# Patient Record
Sex: Male | Born: 1945
Health system: Southern US, Community
[De-identification: ages and names within clinical notes are randomized; demographics above are authoritative.]

## PROBLEM LIST (undated history)

## (undated) DIAGNOSIS — G4700 Insomnia, unspecified: Secondary | ICD-10-CM

## (undated) DIAGNOSIS — N209 Urinary calculus, unspecified: Secondary | ICD-10-CM

## (undated) DIAGNOSIS — Z87442 Personal history of urinary calculi: Secondary | ICD-10-CM

## (undated) DIAGNOSIS — F329 Major depressive disorder, single episode, unspecified: Secondary | ICD-10-CM

## (undated) DIAGNOSIS — F32A Depression, unspecified: Secondary | ICD-10-CM

## (undated) DIAGNOSIS — H269 Unspecified cataract: Secondary | ICD-10-CM

## (undated) DIAGNOSIS — N400 Enlarged prostate without lower urinary tract symptoms: Secondary | ICD-10-CM

## (undated) DIAGNOSIS — F419 Anxiety disorder, unspecified: Secondary | ICD-10-CM

## (undated) DIAGNOSIS — E785 Hyperlipidemia, unspecified: Secondary | ICD-10-CM

## (undated) DIAGNOSIS — I10 Essential (primary) hypertension: Secondary | ICD-10-CM

## (undated) DIAGNOSIS — K635 Polyp of colon: Secondary | ICD-10-CM

## (undated) DIAGNOSIS — B1920 Unspecified viral hepatitis C without hepatic coma: Secondary | ICD-10-CM

## (undated) DIAGNOSIS — K219 Gastro-esophageal reflux disease without esophagitis: Secondary | ICD-10-CM

## (undated) DIAGNOSIS — F909 Attention-deficit hyperactivity disorder, unspecified type: Secondary | ICD-10-CM

## (undated) DIAGNOSIS — J45909 Unspecified asthma, uncomplicated: Secondary | ICD-10-CM

## (undated) DIAGNOSIS — S0291XA Unspecified fracture of skull, initial encounter for closed fracture: Secondary | ICD-10-CM

## (undated) DIAGNOSIS — J439 Emphysema, unspecified: Secondary | ICD-10-CM

## (undated) HISTORY — PX: APPENDECTOMY: SHX54

## (undated) HISTORY — DX: Emphysema, unspecified: J43.9

## (undated) HISTORY — DX: Gastro-esophageal reflux disease without esophagitis: K21.9

## (undated) HISTORY — PX: COLONOSCOPY W/ POLYPECTOMY: SHX1380

## (undated) HISTORY — DX: Unspecified cataract: H26.9

## (undated) HISTORY — DX: Polyp of colon: K63.5

## (undated) HISTORY — DX: Urinary calculus, unspecified: N20.9

## (undated) HISTORY — PX: MOHS SURGERY: SUR867

## (undated) HISTORY — DX: Unspecified fracture of skull, initial encounter for closed fracture: S02.91XA

## (undated) HISTORY — PX: INGUINAL HERNIA REPAIR: SHX194

## (undated) HISTORY — DX: Attention-deficit hyperactivity disorder, unspecified type: F90.9

## (undated) HISTORY — DX: Hyperlipidemia, unspecified: E78.5

## (undated) HISTORY — DX: Unspecified viral hepatitis C without hepatic coma: B19.20

## (undated) HISTORY — DX: Major depressive disorder, single episode, unspecified: F32.9

## (undated) HISTORY — DX: Insomnia, unspecified: G47.00

## (undated) HISTORY — DX: Benign prostatic hyperplasia without lower urinary tract symptoms: N40.0

## (undated) HISTORY — PX: CERVICAL DISC SURGERY: SHX588

## (undated) HISTORY — PX: COLONOSCOPY: SHX174

## (undated) HISTORY — PX: POLYPECTOMY: SHX149

## (undated) HISTORY — PX: TONSILLECTOMY: SUR1361

## (undated) HISTORY — PX: VASECTOMY: SHX75

## (undated) HISTORY — DX: Depression, unspecified: F32.A

## (undated) HISTORY — DX: Anxiety disorder, unspecified: F41.9

## (undated) HISTORY — PX: CERVICAL FUSION: SHX112

## (undated) HISTORY — PX: UMBILICAL HERNIA REPAIR: SHX196

---
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
## 2000-06-28 ENCOUNTER — Encounter: Payer: Self-pay | Admitting: Internal Medicine

## 2000-06-28 ENCOUNTER — Encounter: Admission: RE | Admit: 2000-06-28 | Discharge: 2000-06-28 | Payer: Self-pay | Admitting: Internal Medicine

## 2001-06-17 ENCOUNTER — Encounter: Payer: Self-pay | Admitting: Neurosurgery

## 2001-06-20 ENCOUNTER — Encounter: Payer: Self-pay | Admitting: Neurosurgery

## 2001-06-20 ENCOUNTER — Inpatient Hospital Stay (HOSPITAL_COMMUNITY): Admission: RE | Admit: 2001-06-20 | Discharge: 2001-06-21 | Payer: Self-pay | Admitting: Neurosurgery

## 2001-07-30 ENCOUNTER — Encounter: Admission: RE | Admit: 2001-07-30 | Discharge: 2001-07-30 | Payer: Self-pay | Admitting: Neurosurgery

## 2001-07-30 ENCOUNTER — Encounter: Payer: Self-pay | Admitting: Neurosurgery

## 2001-10-29 ENCOUNTER — Encounter: Admission: RE | Admit: 2001-10-29 | Discharge: 2001-10-29 | Payer: Self-pay | Admitting: Neurosurgery

## 2001-10-29 ENCOUNTER — Encounter: Payer: Self-pay | Admitting: Neurosurgery

## 2002-02-19 ENCOUNTER — Encounter: Payer: Self-pay | Admitting: Neurosurgery

## 2002-02-19 ENCOUNTER — Encounter: Admission: RE | Admit: 2002-02-19 | Discharge: 2002-02-19 | Payer: Self-pay | Admitting: Neurosurgery

## 2002-07-16 ENCOUNTER — Encounter: Admission: RE | Admit: 2002-07-16 | Discharge: 2002-07-16 | Payer: Self-pay | Admitting: Neurosurgery

## 2002-07-16 ENCOUNTER — Encounter: Payer: Self-pay | Admitting: Neurosurgery

## 2003-01-29 ENCOUNTER — Encounter (INDEPENDENT_AMBULATORY_CARE_PROVIDER_SITE_OTHER): Payer: Self-pay | Admitting: Specialist

## 2003-01-29 ENCOUNTER — Ambulatory Visit (HOSPITAL_COMMUNITY): Admission: RE | Admit: 2003-01-29 | Discharge: 2003-01-29 | Payer: Self-pay | Admitting: General Surgery

## 2003-06-15 ENCOUNTER — Encounter: Admission: RE | Admit: 2003-06-15 | Discharge: 2003-06-15 | Payer: Self-pay | Admitting: Neurosurgery

## 2004-01-19 ENCOUNTER — Ambulatory Visit: Payer: Self-pay | Admitting: Internal Medicine

## 2004-02-28 ENCOUNTER — Ambulatory Visit: Payer: Self-pay | Admitting: Internal Medicine

## 2004-10-18 ENCOUNTER — Ambulatory Visit: Payer: Self-pay | Admitting: Internal Medicine

## 2005-01-03 ENCOUNTER — Ambulatory Visit: Payer: Self-pay | Admitting: Internal Medicine

## 2005-08-31 ENCOUNTER — Ambulatory Visit: Payer: Self-pay | Admitting: Internal Medicine

## 2005-10-29 ENCOUNTER — Ambulatory Visit: Payer: Self-pay | Admitting: Internal Medicine

## 2006-02-04 ENCOUNTER — Ambulatory Visit: Payer: Self-pay | Admitting: Internal Medicine

## 2006-02-04 LAB — CONVERTED CEMR LAB
Chol/HDL Ratio, serum: 3.8
Cholesterol: 156 mg/dL (ref 0–200)
HDL: 41.5 mg/dL (ref >39.0)
LDL Cholesterol: 91 mg/dL (ref 0–99)
PSA: 1.01 ng/mL (ref 0.10–4.00)
TSH: 4.22 microintl units/mL (ref 0.35–5.50)
Triglyceride fasting, serum: 118 mg/dL (ref 0–149)
VLDL: 24 mg/dL (ref 0–40)

## 2006-07-31 DIAGNOSIS — Z9089 Acquired absence of other organs: Secondary | ICD-10-CM | POA: Insufficient documentation

## 2006-07-31 DIAGNOSIS — F988 Other specified behavioral and emotional disorders with onset usually occurring in childhood and adolescence: Secondary | ICD-10-CM | POA: Insufficient documentation

## 2006-07-31 DIAGNOSIS — E785 Hyperlipidemia, unspecified: Secondary | ICD-10-CM | POA: Insufficient documentation

## 2006-07-31 DIAGNOSIS — Z9889 Other specified postprocedural states: Secondary | ICD-10-CM | POA: Insufficient documentation

## 2006-07-31 DIAGNOSIS — Z8719 Personal history of other diseases of the digestive system: Secondary | ICD-10-CM

## 2006-07-31 DIAGNOSIS — B1710 Acute hepatitis C without hepatic coma: Secondary | ICD-10-CM | POA: Insufficient documentation

## 2006-07-31 DIAGNOSIS — Z87898 Personal history of other specified conditions: Secondary | ICD-10-CM | POA: Insufficient documentation

## 2007-01-31 ENCOUNTER — Ambulatory Visit: Payer: Self-pay | Admitting: Internal Medicine

## 2007-01-31 DIAGNOSIS — G5600 Carpal tunnel syndrome, unspecified upper limb: Secondary | ICD-10-CM | POA: Insufficient documentation

## 2007-01-31 DIAGNOSIS — B182 Chronic viral hepatitis C: Secondary | ICD-10-CM | POA: Insufficient documentation

## 2007-02-03 ENCOUNTER — Encounter (INDEPENDENT_AMBULATORY_CARE_PROVIDER_SITE_OTHER): Payer: Self-pay | Admitting: *Deleted

## 2007-09-04 ENCOUNTER — Encounter: Payer: Self-pay | Admitting: Internal Medicine

## 2007-10-04 ENCOUNTER — Emergency Department (HOSPITAL_COMMUNITY): Admission: EM | Admit: 2007-10-04 | Discharge: 2007-10-04 | Payer: Self-pay | Admitting: Emergency Medicine

## 2007-10-07 ENCOUNTER — Encounter: Admission: RE | Admit: 2007-10-07 | Discharge: 2007-10-07 | Payer: Self-pay | Admitting: Anesthesiology

## 2007-10-09 ENCOUNTER — Encounter: Admission: RE | Admit: 2007-10-09 | Discharge: 2007-10-09 | Payer: Self-pay | Admitting: Neurological Surgery

## 2007-10-28 ENCOUNTER — Encounter: Admission: RE | Admit: 2007-10-28 | Discharge: 2007-10-28 | Payer: Self-pay | Admitting: Neurosurgery

## 2007-12-10 ENCOUNTER — Telehealth (INDEPENDENT_AMBULATORY_CARE_PROVIDER_SITE_OTHER): Payer: Self-pay | Admitting: *Deleted

## 2007-12-16 ENCOUNTER — Ambulatory Visit: Payer: Self-pay | Admitting: Internal Medicine

## 2007-12-16 DIAGNOSIS — F329 Major depressive disorder, single episode, unspecified: Secondary | ICD-10-CM

## 2007-12-16 DIAGNOSIS — F32A Depression, unspecified: Secondary | ICD-10-CM | POA: Insufficient documentation

## 2007-12-16 DIAGNOSIS — F411 Generalized anxiety disorder: Secondary | ICD-10-CM

## 2007-12-16 DIAGNOSIS — IMO0001 Reserved for inherently not codable concepts without codable children: Secondary | ICD-10-CM | POA: Insufficient documentation

## 2007-12-16 DIAGNOSIS — F419 Anxiety disorder, unspecified: Secondary | ICD-10-CM | POA: Insufficient documentation

## 2008-01-16 ENCOUNTER — Ambulatory Visit: Payer: Self-pay | Admitting: Internal Medicine

## 2008-01-20 LAB — CONVERTED CEMR LAB
Alkaline Phosphatase: 55 units/L (ref 39–117)
BUN: 23 mg/dL (ref 6–23)
Bilirubin Urine: NEGATIVE
Bilirubin, Direct: 0.2 mg/dL (ref 0.0–0.3)
Calcium: 9.2 mg/dL (ref 8.4–10.5)
Chloride: 106 meq/L (ref 96–112)
Creatinine, Ser: 0.9 mg/dL (ref 0.4–1.5)
GFR calc Af Amer: 110 mL/min
GFR calc non Af Amer: 91 mL/min
Hemoglobin, Urine: NEGATIVE
Ketones, ur: NEGATIVE mg/dL
Leukocytes, UA: NEGATIVE
PSA: 1.65 ng/mL (ref 0.10–4.00)
Potassium: 4.3 meq/L (ref 3.5–5.1)
Total Protein, Urine: NEGATIVE mg/dL
Triglycerides: 150 mg/dL — ABNORMAL HIGH (ref 0–149)
VLDL: 30 mg/dL (ref 0–40)

## 2008-01-23 ENCOUNTER — Ambulatory Visit: Payer: Self-pay | Admitting: Internal Medicine

## 2009-01-07 ENCOUNTER — Ambulatory Visit: Payer: Self-pay | Admitting: Internal Medicine

## 2009-01-07 DIAGNOSIS — M25529 Pain in unspecified elbow: Secondary | ICD-10-CM | POA: Insufficient documentation

## 2009-01-07 DIAGNOSIS — F17200 Nicotine dependence, unspecified, uncomplicated: Secondary | ICD-10-CM | POA: Insufficient documentation

## 2009-01-07 DIAGNOSIS — J309 Allergic rhinitis, unspecified: Secondary | ICD-10-CM | POA: Insufficient documentation

## 2009-01-11 LAB — CONVERTED CEMR LAB
ALT: 20 units/L (ref 0–53)
AST: 29 units/L (ref 0–37)
Albumin: 4.2 g/dL (ref 3.5–5.2)
BUN: 19 mg/dL (ref 6–23)
Basophils Absolute: 0 10*3/uL (ref 0.0–0.1)
Eosinophils Absolute: 0.2 10*3/uL (ref 0.0–0.7)
Eosinophils Relative: 2.8 % (ref 0.0–5.0)
Glucose, Bld: 100 mg/dL — ABNORMAL HIGH (ref 70–99)
HCT: 48.3 % (ref 39.0–52.0)
HDL: 41.8 mg/dL (ref >39.00)
Hemoglobin: 16.5 g/dL (ref 13.0–17.0)
Ketones, ur: NEGATIVE mg/dL
Lymphocytes Relative: 28.6 % (ref 12.0–46.0)
Lymphs Abs: 2 10*3/uL (ref 0.7–4.0)
MCHC: 34.1 g/dL (ref 30.0–36.0)
Nitrite: NEGATIVE
PSA: 1.17 ng/mL (ref 0.10–4.00)
Platelets: 260 10*3/uL (ref 150.0–400.0)
RDW: 11.9 % (ref 11.5–14.6)
Specific Gravity, Urine: 1.02 (ref 1.000–1.030)
Total Bilirubin: 1 mg/dL (ref 0.3–1.2)
VLDL: 64 mg/dL — ABNORMAL HIGH (ref 0.0–40.0)

## 2009-02-01 ENCOUNTER — Encounter (INDEPENDENT_AMBULATORY_CARE_PROVIDER_SITE_OTHER): Payer: Self-pay | Admitting: *Deleted

## 2009-02-21 ENCOUNTER — Encounter (INDEPENDENT_AMBULATORY_CARE_PROVIDER_SITE_OTHER): Payer: Self-pay | Admitting: *Deleted

## 2009-02-22 ENCOUNTER — Ambulatory Visit: Payer: Self-pay | Admitting: Internal Medicine

## 2009-03-18 ENCOUNTER — Ambulatory Visit: Payer: Self-pay | Admitting: Internal Medicine

## 2009-03-18 LAB — HM COLONOSCOPY

## 2009-03-22 ENCOUNTER — Encounter: Payer: Self-pay | Admitting: Internal Medicine

## 2009-04-06 ENCOUNTER — Telehealth: Payer: Self-pay | Admitting: Internal Medicine

## 2009-04-11 ENCOUNTER — Ambulatory Visit: Payer: Self-pay | Admitting: Licensed Clinical Social Worker

## 2009-04-22 ENCOUNTER — Ambulatory Visit: Payer: Self-pay | Admitting: Licensed Clinical Social Worker

## 2009-09-02 ENCOUNTER — Telehealth: Payer: Self-pay | Admitting: Internal Medicine

## 2009-09-05 ENCOUNTER — Telehealth (INDEPENDENT_AMBULATORY_CARE_PROVIDER_SITE_OTHER): Payer: Self-pay | Admitting: *Deleted

## 2009-10-21 ENCOUNTER — Ambulatory Visit: Payer: Self-pay | Admitting: Internal Medicine

## 2009-10-21 DIAGNOSIS — J019 Acute sinusitis, unspecified: Secondary | ICD-10-CM | POA: Insufficient documentation

## 2009-10-24 ENCOUNTER — Telehealth: Payer: Self-pay | Admitting: Internal Medicine

## 2009-11-10 ENCOUNTER — Ambulatory Visit: Payer: Self-pay | Admitting: Internal Medicine

## 2009-11-10 DIAGNOSIS — G4700 Insomnia, unspecified: Secondary | ICD-10-CM | POA: Insufficient documentation

## 2010-02-23 ENCOUNTER — Telehealth: Payer: Self-pay | Admitting: Internal Medicine

## 2010-02-24 ENCOUNTER — Ambulatory Visit: Payer: Self-pay | Admitting: Internal Medicine

## 2010-02-24 DIAGNOSIS — R519 Headache, unspecified: Secondary | ICD-10-CM | POA: Insufficient documentation

## 2010-02-24 DIAGNOSIS — R209 Unspecified disturbances of skin sensation: Secondary | ICD-10-CM | POA: Insufficient documentation

## 2010-02-24 DIAGNOSIS — R51 Headache: Secondary | ICD-10-CM | POA: Insufficient documentation

## 2010-02-27 LAB — CONVERTED CEMR LAB
CO2: 28 meq/L (ref 19–32)
Calcium: 9.3 mg/dL (ref 8.4–10.5)
GFR calc non Af Amer: 83.4 mL/min (ref >60.00)
Glucose, Bld: 92 mg/dL (ref 70–99)
Potassium: 4.6 meq/L (ref 3.5–5.1)
Sed Rate: 9 mm/hr (ref 0–22)
TSH: 3.56 microintl units/mL (ref 0.35–5.50)
Vitamin B-12: 373 pg/mL (ref 211–911)

## 2010-03-12 DIAGNOSIS — N209 Urinary calculus, unspecified: Secondary | ICD-10-CM

## 2010-03-12 HISTORY — DX: Urinary calculus, unspecified: N20.9

## 2010-04-07 ENCOUNTER — Ambulatory Visit
Admission: RE | Admit: 2010-04-07 | Discharge: 2010-04-07 | Payer: Self-pay | Source: Home / Self Care | Attending: Internal Medicine | Admitting: Internal Medicine

## 2010-04-07 DIAGNOSIS — K612 Anorectal abscess: Secondary | ICD-10-CM | POA: Insufficient documentation

## 2010-04-07 DIAGNOSIS — R040 Epistaxis: Secondary | ICD-10-CM | POA: Insufficient documentation

## 2010-04-09 ENCOUNTER — Emergency Department (HOSPITAL_COMMUNITY)
Admission: EM | Admit: 2010-04-09 | Discharge: 2010-04-09 | Payer: Self-pay | Source: Home / Self Care | Admitting: Emergency Medicine

## 2010-04-09 LAB — CONVERTED CEMR LAB
ALT: 19 units/L (ref 0–53)
Albumin: 4.2 g/dL (ref 3.5–5.2)
Alkaline Phosphatase: 36 units/L — ABNORMAL LOW (ref 39–117)
BUN: 24 mg/dL — ABNORMAL HIGH (ref 6–23)
Basophils Absolute: 0 10*3/uL (ref 0.0–0.1)
Bilirubin, Direct: 0.1 mg/dL (ref 0.0–0.3)
CO2: 30 meq/L (ref 19–32)
Cholesterol: 177 mg/dL (ref 0–200)
Creatinine, Ser: 1.3 mg/dL (ref 0.4–1.5)
Glucose, Bld: 102 mg/dL — ABNORMAL HIGH (ref 70–99)
HCT: 41.6 % (ref 39.0–52.0)
HDL: 37.9 mg/dL — ABNORMAL LOW (ref >39.0)
LDL Cholesterol: 107 mg/dL — ABNORMAL HIGH (ref 0–99)
Lymphocytes Relative: 31.5 % (ref 12.0–46.0)
MCHC: 34.6 g/dL (ref 30.0–36.0)
Monocytes Absolute: 0.6 10*3/uL (ref 0.2–0.7)
Monocytes Relative: 10.7 % (ref 3.0–11.0)
Potassium: 4.6 meq/L (ref 3.5–5.1)
RBC: 4.6 M/uL (ref 4.22–5.81)
RDW: 11.6 % (ref 11.5–14.6)
Sodium: 142 meq/L (ref 135–145)
TSH: 4.03 microintl units/mL (ref 0.35–5.50)

## 2010-04-09 LAB — DIFFERENTIAL
Eosinophils Relative: 0 % (ref 0–5)
Lymphocytes Relative: 3 % — ABNORMAL LOW (ref 12–46)
Lymphs Abs: 0.3 10*3/uL — ABNORMAL LOW (ref 0.7–4.0)
Monocytes Absolute: 0.6 10*3/uL (ref 0.1–1.0)
Monocytes Relative: 5 % (ref 3–12)
Neutro Abs: 10.8 10*3/uL — ABNORMAL HIGH (ref 1.7–7.7)
Neutrophils Relative %: 92 % — ABNORMAL HIGH (ref 43–77)

## 2010-04-09 LAB — COMPREHENSIVE METABOLIC PANEL
ALT: 22 U/L (ref 0–53)
AST: 37 U/L (ref 0–37)
Albumin: 4.2 g/dL (ref 3.5–5.2)
Alkaline Phosphatase: 62 U/L (ref 39–117)
GFR calc non Af Amer: >60 mL/min (ref >60)
Total Bilirubin: 1.3 mg/dL — ABNORMAL HIGH (ref 0.3–1.2)

## 2010-04-09 LAB — URINALYSIS, ROUTINE W REFLEX MICROSCOPIC
Ketones, ur: NEGATIVE mg/dL
Nitrite: NEGATIVE
Urine Glucose, Fasting: NEGATIVE mg/dL

## 2010-04-09 LAB — CBC: Hemoglobin: 16.8 g/dL (ref 13.0–17.0)
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
## 2010-04-11 NOTE — Progress Notes (Signed)
----   Converted from flag ---- ---- 09/05/2009 4:39 PM, Lamar Sprinkles, CMA wrote: Patient needs follow up w/plot w/in the next 3 mths please  thanks ------------------------------ Gave pt appt/phone--11/10/09 @ 745A

## 2010-04-11 NOTE — Progress Notes (Signed)
Summary: Call Report  Phone Note Other Incoming   Caller: [facility] Summary of Call: [facility] Triage Call Report Triage Record Num: [id number] Operator: Cecilie Lowers Patient Name: Paul [name] Call Date & Time: 10/23/2009 6:30:56PM Patient Phone: [phone] PCP: Patient Gender: Male PCP Fax : Patient DOB: 03-Jun-1945 Practice Name: [facility] Reason for Call: Pt calling because he has a infection, saw MD and was dx Friday (10/21/09). MD stated he was unsure of what kind of infection (maybe sinus). Rx for Keflex 500 mg 3 times a day, and cough med. Calling because he does not feel any better and in fact may feel worse. Pt does not think he is running a fever, if he is it comes and goes. All emergent s/s for Upper Respiratory Infection r/o per protocol. Advised pt to see provider in 24 hours. Pt will call the office for an appointment to be seen first thing in the am (10/24/09). Protocol(s) Used: Flu-Like Symptoms Protocol(s) Used: Upper Respiratory Infection (URI) Recommended Outcome per Protocol: See Provider within 24 hours Reason for Outcome: Mild to moderate headache for more than 24 hours unrelieved with nonprescription medications Gradual onset of upper respiratory illness signs and symptoms (If there is any doubt that symptoms may be an upper respiratory illness, use this guideline) Initial call taken by: Margaret Pyle, CMA,  October 24, 2009 8:47 AM  Follow-up for Phone Call        noted - to [facility] to help schedule please  Follow-up by: Corwin Levins MD,  October 24, 2009 5:30 PM  Additional Follow-up for Phone Call Additional follow up Details #1::        SPOKE WITH PATIENT TO SCHEDULE AN APPT.  HE FEELS BETTER.  WILL CALL IF WORSE.  HE HAS A REG APPT WITH DR [name] ON SEPT 1.  Additional Follow-up by: Hilarie Fredrickson,  October 25, 2009 10:59 AM

## 2010-04-11 NOTE — Progress Notes (Signed)
Summary: Refill--Zolpidem  Phone Note Refill Request Message from:  Fax from Pharmacy on September 02, 2009 4:02 PM  Refills Requested: Medication #1:  ZOLPIDEM TARTRATE 10 MG  TABS 1/2 or 1 by mouth at hs prn Next Appointment Scheduled: none Initial call taken by: Lucious Groves,  September 02, 2009 4:02 PM  Follow-up for Phone Call        OK 3 ref Sch OV in 2-3 months  Follow-up by: Tresa Garter MD,  September 02, 2009 6:04 PM  Additional Follow-up for Phone Call Additional follow up Details #1::        called in, sent flag to schedulers for f/u of office visit  Additional Follow-up by: Lamar Sprinkles, CMA,  September 05, 2009 4:39 PM    Prescriptions: ZOLPIDEM TARTRATE 10 MG  TABS (ZOLPIDEM TARTRATE) 1/2 or 1 by mouth at hs prn  #30 x 3   Entered by:   Lamar Sprinkles, CMA   Authorized by:   Tresa Garter MD   Signed by:   Lamar Sprinkles, CMA on 09/05/2009   Method used:   Telephoned to ...       [facility]  [address] [phone]* (retail)       [address]       [place], [place]  [address]       Ph: [phone] or [phone]       Fax: [phone]   RxID:   [id number]

## 2010-04-11 NOTE — Procedures (Signed)
Summary: Colonoscopy  Patient: [name] Note: All result statuses are Final unless otherwise noted.  Tests: (1) Colonoscopy (COL)   COL Colonoscopy           DONE     [facility]     [address]     [place], [place]  [address]           COLONOSCOPY PROCEDURE REPORT           PATIENT:  Paul [name], Paul [name]  MR#:  [id number]     BIRTHDATE:  June 30, 1945, 63 yrs. old  GENDER:  male           ENDOSCOPIST:  Wilhemina Bonito. Eda Keys, MD     Referred by:  Surveillance Program Recall,           PROCEDURE DATE:  03/18/2009     PROCEDURE:  Colonoscopy with snare polypectomy     ASA CLASS:  Class II     INDICATIONS:  history of polyps (INDEX EXAM 1998 W/ ADENOMA; [place]     2003); FATHER W/ COLON CA AGE 63           MEDICATIONS:   Fentanyl 100 mcg IV, Versed 10 mg IV           DESCRIPTION OF PROCEDURE:   After the risks benefits and     alternatives of the procedure were thoroughly explained, informed     consent was obtained.  Digital rectal exam was performed and     revealed no abnormalities.   The LB [id number] endoscope     was introduced through the anus and advanced to the cecum, which     was identified by both the appendix and ileocecal valve, without     limitations.TIME TO CECUM = 3:59MIN  The quality of the prep was     excellent, using MoviPrep.  The instrument was then slowly     withdrawn (TIME = 14:41MIN) as the colon was fully examined.     <<PROCEDUREIMAGES>>           FINDINGS:  Four polyps were found -  in the cecum (29mm,3mm) and     transverse colon (89mm,4mm). Polyps were snared without cautery.     Retrieval was successful.  This was otherwise a normal examination     of the colon.   Retroflexed views in the rectum revealed no     abnormalities.    The scope was then withdrawn from the patient     and the procedure completed.           COMPLICATIONS:  None           ENDOSCOPIC IMPRESSION:     1) Four polyps - removed     2) Otherwise normal  examination     RECOMMENDATIONS:     1) Follow up colonoscopy in 3 years           ______________________________     Wilhemina Bonito. Eda Keys, MD           CC:  Zackery Barefoot, MD; The Patient           n.     eSIGNED:   Wilhemina Bonito. Eda Keys at 03/18/2009 12:33 PM           Leonides Schanz, [id number]  Note: An exclamation mark (!) indicates a result that was not dispersed into the flowsheet. Document Creation Date: 03/18/2009 12:33 PM _______________________________________________________________________  (1) Order result  status: Final Collection or observation date-time: 03/18/2009 12:21 Requested date-time:  Receipt date-time:  Reported date-time:  Referring Physician:   Ordering Physician: Fransico Setters [phone]) Specimen Source:  Source: Launa Grill Order Number: [id number] Lab site:   Appended Document: Colonoscopy recall     Procedures Next Due Date:    Colonoscopy: 03/2012
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
## 2010-04-11 NOTE — Letter (Signed)
Summary: Patient Notice- Polyp Results  [facility]  [address] [place], [place] [address]   Phone: [phone]  Fax: [phone]        March 22, 2009 MRN: [id number]    Paul [name] [address] [place], [place]  [address]    Dear [name],  I am pleased to inform you that the colon polyp(s) removed during your recent colonoscopy was (were) found to be benign (no cancer detected) upon pathologic examination.  I recommend you have a repeat colonoscopy examination in 3 years to look for recurrent polyps, as having colon polyps increases your risk for having recurrent polyps or even colon cancer in the future.  Should you develop new or worsening symptoms of abdominal pain, bowel habit changes or bleeding from the rectum or bowels, please schedule an evaluation with either your primary care physician or with me.  Additional information/recommendations:  __ No further action with gastroenterology is needed at this time. Please      follow-up with your primary care physician for your other healthcare      needs.   Please call us if you are having persistent problems or have questions about your condition that have not been fully answered at this time.  Sincerely,  Hilarie Fredrickson MD  This letter has been electronically signed by your physician.  Appended Document: Patient Notice- Polyp Results Letter mailed 01.13.11

## 2010-04-11 NOTE — Progress Notes (Signed)
Summary: referral  Phone Note Call from Patient Call back at Home Phone [phone]   Summary of Call: Patient left message on triage stating that he would like a referral to a male MD (psychology). Patient would like to see her regarding relationship/marriage issues and prefers a male to better help him understand the male point of view. Initial call taken by: Lucious Groves,  April 06, 2009 1:49 PM  Follow-up for Phone Call        OK S Bond - done OV if needed Follow-up by: Tresa Garter MD,  April 06, 2009 4:09 PM  Additional Follow-up for Phone Call Additional follow up Details #1::        Pt informed  Additional Follow-up by: Lamar Sprinkles, CMA,  April 07, 2009 10:31 AM  New Problems: STRESS DISORDER (ICD-V62.89)   New Problems: STRESS DISORDER (ICD-V62.89)

## 2010-04-13 NOTE — Progress Notes (Signed)
Summary: FYI-pt coming in tom 8:15am  Phone Note Call from Patient   Caller: Spouse Summary of Call: Pt has been having pain behind Rt eye, Rt shoulder and arm pain X 1 week.  She states he has not mentioned vision changes or numbness/tingling. He is currently out of town on business until later tonight. I informed her to keep using otc meds (ibuprofen/tylenol, etc) for pain relief and come in tom am. If he develops numbness/tingling or in unable to walk, talk or move before appt at 8:15am to proceed to nearest ER. She understands. Initial call taken by: Lanier Prude, [facility]),  February 23, 2010 11:54 AM  Follow-up for Phone Call        agree Thank you!  Follow-up by: Tresa Garter MD,  February 23, 2010 1:19 PM
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
## 2010-04-17 ENCOUNTER — Telehealth: Payer: Self-pay | Admitting: Internal Medicine

## 2010-04-27 NOTE — Progress Notes (Signed)
Summary: Rf Ambien  Phone Note Refill Request Message from:  Pharmacy  Refills Requested: Medication #1:  ZOLPIDEM TARTRATE 10 MG  TABS 1/2 or 1 by mouth at hs prn   Dosage confirmed as above?Dosage Confirmed   Supply Requested: 30   Last Refilled: 02/15/2010  Method Requested: Telephone to Pharmacy Next Appointment Scheduled: 06-02-10 Initial call taken by: Lanier Prude, [facility]),  April 17, 2010 10:05 AM  Follow-up for Phone Call        ok 3 ref Follow-up by: Tresa Garter MD,  April 17, 2010 6:04 PM    Prescriptions: ZOLPIDEM TARTRATE 10 MG  TABS (ZOLPIDEM TARTRATE) 1/2 or 1 by mouth at hs prn  #30 x 3   Entered by:   Rock Nephew CMA   Authorized by:   Tresa Garter MD   Signed by:   Rock Nephew CMA on 04/18/2010   Method used:   Telephoned to ...       [facility]  [address] [phone]* (retail)       [address]       [place], [place]  [address]       Ph: [phone] or [phone]       Fax: [phone]   RxID:   [id number]

## 2010-05-05 ENCOUNTER — Encounter: Payer: Self-pay | Admitting: Endocrinology

## 2010-05-05 ENCOUNTER — Ambulatory Visit (INDEPENDENT_AMBULATORY_CARE_PROVIDER_SITE_OTHER): Payer: 59 | Admitting: Endocrinology

## 2010-05-05 DIAGNOSIS — N209 Urinary calculus, unspecified: Secondary | ICD-10-CM | POA: Insufficient documentation

## 2010-05-05 DIAGNOSIS — J019 Acute sinusitis, unspecified: Secondary | ICD-10-CM
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
## 2010-06-02 ENCOUNTER — Ambulatory Visit: Payer: Self-pay | Admitting: Internal Medicine

## 2010-07-24 ENCOUNTER — Other Ambulatory Visit: Payer: Self-pay | Admitting: Internal Medicine

## 2010-07-28 NOTE — Op Note (Signed)
NAME:  Paul [name], Paul [name]                        ACCOUNT NO.:  [id number]   MEDICAL RECORD NO.:  [id number]                   PATIENT TYPE:  OIB   LOCATION:  2888                                 FACILITY:  [facility]   PHYSICIAN:  Leonie Man, M.D.                DATE OF BIRTH:  07-13-1945   DATE OF PROCEDURE:  01/29/2003  DATE OF DISCHARGE:  01/29/2003                                 OPERATIVE REPORT   PREOPERATIVE DIAGNOSIS:  1. Right inguinal hernia.  2. Incarcerated umbilical hernia.   POSTOPERATIVE DIAGNOSIS:  1. Right inguinal hernia.  2. Incarcerated umbilical hernia.   PROCEDURE:  1. Repair direct right inguinal hernia with mesh.  2. Repair of incarcerated umbilical hernia with mesh.   SURGEON:  Mardene Celeste. Lurene Shadow, M.D.   ASSISTANT:  Nurse   ANESTHESIA:  General.   NOTE:  This patient is a 65 year old man presenting originally with a  painful and prolapsing right sided inguinal hernia.  On evaluation, he is  also noted to have an incarcerated umbilical hernia.  The patient is coming  now to the operating room after the risks and potential benefits of hernia  surgery have been fully discussed, all questions answered, and consent  obtained.   PROCEDURE:  Following the induction of satisfactory general anesthesia, the  patient was positioned supine, the lower abdomen was prepped and draped to  be included in the sterile operative field.  The right groin crease was  infiltrated with 0.5% Marcaine with 1:200,000 epinephrine.  A transverse  incision was made in the lower groin crease and deepened through the skin  and subcutaneous tissue dissecting down to the external oblique aponeurosis.  The external oblique aponeurosis was opened and the ilioinguinal nerve was  protected.  The ilioinguinal nerve was retracted inferiorly and laterally.  The spermatic cord was elevated from the floor and held with a Penrose  drain.  Dissection along the spermatic cord did not reveal an  indirect sac.  Dissection in the Hesselbach's triangle showed a very large direct hernia.  This was repaired with a polypropylene mesh patch which was sewn in the  pubic tubercle with 2-0 Novafil and carried up along the conjoined tendon to  the internal ring and again from the pubic tubercle with 2-0 Novafil along  the shelving edge of Poupart's ligament up to the internal ring.  The mesh  was split so ask to allow protrusion of the spermatic cord between the  leaflets of the mesh.  The tails of the mesh were trimmed and sutured into  the internal oblique muscle behind the cord.  All areas of dissection were  checked for hemostasis and noted to be dry.  Sponge, instrument and sharp  counts were verified.  The external oblique aponeurosis was closed over the  cord with running 2-0 Vicryl suture, thereby, reapproximating the external  inguinal ring.  Scarpa's fascia was closed with a running 3-0  Vicryl suture.  The skin was closed with a running 4-0 Monocryl suture reinforced with Steri-  Strips.   Attention was turned to the umbilical hernia where an infraumbilical  incision was made through the skin and subcutaneous tissue raising the  umbilical skin cephalad as a flap.  The hernia sac, which was adjacent to  the umbilical skin, is dissected free carrying the dissection down to the  fascia.  The entire umbilical hernia sac is dissected free along with its  incarcerated contents which happens to be portions or the round ligament and  some omentum.  This is transected between clamps and secured with ties of 2-  0 silk.  The hernia edges are then freshened for repair.  I used a small  polypropylene mesh plug to fill the defect and this was sewn in with  interrupted sutures of 0 Novafil.  The hernia repair was noted to be intact.  Sponge and instrument counts were verified.  The subcutaneous tissues are  closed over the repair with a running 2-0 Vicryl suture and the skin was  closed with a  running 4-0 Monocryl suture.  This wound is also reinforced  with Steri-Strips.  Sterile dressings were applied, the anesthetic reversed,  and the patient removed from the operating room to the recovery room in  stable condition, having tolerated the procedure well.                                               Leonie Man, M.D.    PB/MEDQ  D:  01/29/2003  T:  01/30/2003  Job:  [id number]   cc:   Thelma Barge P. Modesto Charon, M.D.  [address]  [place]  [place] [address]  Fax: [phone]

## 2010-07-28 NOTE — Op Note (Signed)
[place]  Patient:    Paul [name], Paul [name] Visit Number: [id number] MRN: [id number]          Service Type: SUR Location: 3000 [id number] Attending Physician:  Mariam Dollar Dictated by:   Garlon Hatchet., M.D. Proc. Date: 06/20/01 Admit Date:  06/20/2001                             Operative Report  PREOPERATIVE DIAGNOSIS:  Cervical spondylosis with spondylitic radiculopathy left arm, C6 and C7.  PROCEDURE:  Anterior cervical diskectomies and fusion at C5-6 and C6-7 using 6 mm patellar wedges and a 40 mm Atlantis plate and six 13 mm variable-angled screws.  SURGEON:  Garlon Hatchet., M.D.  FIRST ASSISTANT:  Julio Sicks, M.D.  ANESTHESIA:  General endotracheal.  CLINICAL NOTE:  The patient is a very pleasant 65 year old gentleman who has had long-standing neck and left arm pain that has been refractory to conservative treatment.  It radiated down into his fingers with numbness and tingling in the same distribution.  The patient had preoperative weakness of the biceps, triceps about 4+/5 on the left.  The patient had failed conservative treatment.  Preoperative imaging showed severe cervical spondylosis, spinal cord compression, and nerve root compression at C6 and C7, left greater than right.  The patient was extensively recommended and counseled of the risks and benefits of an anterior cervical diskectomy and fusion and decided to proceed forward.  DESCRIPTION OF PROCEDURE:  The patient was brought in the OR, was induced under general anesthesia, placed supine with the neck in slight extension, and the right side of his neck was prepped and draped in a sterile fashion.  After localization of a needle at the C6 vertebral body, a curvilinear incision was made just off the midline to the anterior border of the sternocleidomastoid with a 10 blade scalpel.  The superficial layer of the platysma was dissected out and divided longitudinally.  The  avascular plane between the sternocleidomastoid and strap muscles was developed down to the prevertebral fascia.  The prevertebral fascia was dissected with Kitners.  There was a very large anterior osteophyte coming off the C5-6 interspace.  This was bitten down with a Leksell rongeur.  Intraoperative x-ray confirmed localization of C5-6 disk space.  Then Bovie electrocautery was used to take down the longus, which as reflected laterally.  A self-retaining retractor was placed.  The remainder of the anterior osteophyte at C5-6 was bitten down as well as exposure of the C5, C6, and C7 vertebral bodies.  The annulus at C5-6 and C6-7 was incised with a 10 blade scalpel, and pituitary rongeurs were used to remove the anterior margin of the annulus.  A high-speed drill was used to drill down the annulus down to posterior osteophyte and posterior annulus at both levels, and the operating microscope was draped, brought into the field, and under microscopic illumination the remainder of the C5-6 disk space was cleaned out using a 1 and 2 mm Kerrison punch to the posterior annulus.  The posterior longitudinal ligament was removed.  There were very large osteophytes coming off both the C5 and C6 vertebral bodies, underbitten with a 1 and 2 mm Kerrison punch.  The thecal sac was visualized and radically decompressed out both C6 neural foramina.  Both neural foramina were explored with angled nerve hook and noted to be widely decompressed.  Both C6 nerve roots were widely decompressed.  The wound was copiously irrigated.  The end plates were then evened off and Gelfoam was placed.  Attention was then taken to the C6-7 disk space.  Again the end plates were drilled down to posterior osteophyte.  There was large hypertrophied ligament and disk compression and uncinate hypertrophy compressing the left C7 nerve, and this was all underbitten with a 1 and 2 mm Kerrison punch, and the thecal sac  was decompressed as well as both C7 neural foramina and explored with the angled nerve hook.  End plates again were prepared for arthrodesis.  Both interspaces were sized, and a 6 mm patellar wedge was sized, selected, and inserted after an interbody spreader had been placed, approximately 2 mm deep to the anterior vertebral body line.  These were placed, and a 40 mm Atlantis plate was sized, selected, and the anterior vertebral bodies were prepared to receive the plate.  Six 13 mm variable-angled screws were drilled, tapped, and placed. All screws had excellent purchase.  Set screws were tightened down.  Postop x-ray confirmed good localization and position of plate, screws, and bone grafts, and then meticulous hemostasis was maintained.  The longus colli was oozing, and this was bipolared and then again meticulous hemostasis was maintained.  The platysma was reapproximated with 3-0 interrupted Vicryl.  The skin was closed with running 4-0 subcuticular and benzoin and Steri-Strips were applied.  The patient went to the recovery room in stable condition.  At the end of the case, all needle counts and sponge counts were correct. Dictated by:   Garlon Hatchet., M.D. Attending Physician:  Mariam Dollar DD:  06/20/01 TD:  06/22/01 Job: [id number]

## 2010-08-14 ENCOUNTER — Encounter: Payer: Self-pay | Admitting: Internal Medicine

## 2010-08-14 ENCOUNTER — Ambulatory Visit (INDEPENDENT_AMBULATORY_CARE_PROVIDER_SITE_OTHER): Payer: 59 | Admitting: Internal Medicine

## 2010-08-14 DIAGNOSIS — N209 Urinary calculus, unspecified: Secondary | ICD-10-CM

## 2010-08-14 DIAGNOSIS — N281 Cyst of kidney, acquired: Secondary | ICD-10-CM

## 2010-08-14 DIAGNOSIS — N50819 Testicular pain, unspecified: Secondary | ICD-10-CM | POA: Insufficient documentation

## 2010-08-14 DIAGNOSIS — N509 Disorder of male genital organs, unspecified: Secondary | ICD-10-CM

## 2010-08-14 MED ORDER — CIPROFLOXACIN HCL 500 MG PO TABS: 500.0000 mg | ORAL_TABLET | Freq: Two times a day (BID) | ORAL | Status: AC

## 2010-08-14 MED ORDER — IBUPROFEN 600 MG PO TABS: ORAL_TABLET | ORAL | Status: AC

## 2010-08-14 NOTE — Patient Instructions (Signed)
Support underware Call if not better

## 2010-09-02 ENCOUNTER — Encounter: Payer: Self-pay | Admitting: Family Medicine

## 2010-09-02 ENCOUNTER — Ambulatory Visit (INDEPENDENT_AMBULATORY_CARE_PROVIDER_SITE_OTHER): Payer: 59 | Admitting: Family Medicine

## 2010-09-02 VITALS — BP 142/90 | Temp 97.2°F | Wt 204.0 lb

## 2010-09-02 DIAGNOSIS — M545 Low back pain, unspecified: Secondary | ICD-10-CM

## 2010-09-02 MED ORDER — HYDROCODONE-ACETAMINOPHEN 7.5-750 MG PO TABS: 1.0000 | ORAL_TABLET | ORAL | Status: DC | PRN

## 2010-09-02 NOTE — Progress Notes (Signed)
  Subjective:    Patient ID: Paul [name], male    DOB: [birth year], 65 y.o.   MRN: [id number]  HPI  65 y/o male married w 1 day h/o of severe,,,,,,,,9,,,,,,,,,,,lbp.  Sudden onset yest.,,,,,sharp,r lumber w rad down r hip,,no bowel or bladder sym.  Pres h/o cerd disc surg Dr. Wynetta Emery    Review of Systems gen and neuro ros neg    Objective:   Physical Exam wdwn male severe pain Strength, sens., reflex alll wnl ,,pos. slr r at 30 deg.       Assessment & Plan:  Acute LDD.........Marland Kitchenbed rest,,,,,vicodin,see pcp mon. For further eval,,,,,,,,if any neuro def,,call neurosurg stat

## 2010-09-02 NOTE — Patient Instructions (Signed)
Bedrest at home  MOM to prev. Constipation  Vicd,,,,,,,,1/2 to 1 q 4h prn pain  Call to see pcp mond. For fup    call neurosurg stat if ant neuro def.

## 2010-09-10 NOTE — Progress Notes (Signed)
  Subjective:    Patient ID: Paul [name], male    DOB: [birth year], 65 y.o.   MRN: [id number]  HPI  C/o L testicular pain x 2-3 wks, dull, mild-to moderate off and on, worse lately. Better w/rest. He is worried about his kidney cyst, bladder stone, kidney stones. No LBP at present, no fever, no dysuria  Review of Systems  Constitutional: Positive for fatigue. Negative for chills, appetite change and unexpected weight change.  HENT: Negative for nosebleeds, congestion, sore throat, sneezing, trouble swallowing and neck pain.   Eyes: Negative for itching and visual disturbance.  Respiratory: Negative for cough.   Cardiovascular: Negative for chest pain, palpitations and leg swelling.  Gastrointestinal: Negative for nausea, diarrhea, blood in stool and abdominal distention.  Genitourinary: Positive for frequency and testicular pain (as above). Negative for dysuria, urgency, hematuria, penile swelling, scrotal swelling, difficulty urinating and penile pain.  Musculoskeletal: Positive for back pain. Negative for joint swelling and gait problem.  Skin: Negative for rash.  Neurological: Negative for dizziness, tremors, speech difficulty and weakness.  Psychiatric/Behavioral: Negative for suicidal ideas, sleep disturbance, dysphoric mood and agitation. The patient is nervous/anxious.        Objective:   Physical Exam  Constitutional: He is oriented to person, place, and time. He appears well-developed.       NAD  HENT:  Mouth/Throat: Oropharynx is clear and moist.  Eyes: Conjunctivae are normal. Pupils are equal, round, and reactive to light.  Neck: Normal range of motion. No JVD present. No thyromegaly present.  Cardiovascular: Normal rate, regular rhythm, normal heart sounds and intact distal pulses.  Exam reveals no gallop and no friction rub.   No murmur heard. Pulmonary/Chest: Effort normal and breath sounds normal. No respiratory distress. He has no wheezes. He has no rales. He  exhibits no tenderness.  Abdominal: Soft. Bowel sounds are normal. He exhibits no distension and no mass. There is no tenderness. There is no rebound and no guarding.  Genitourinary: Penis normal. No penile tenderness.       L testis is tender over the epidydimis  Musculoskeletal: Normal range of motion. He exhibits no edema and no tenderness.  Lymphadenopathy:    He has no cervical adenopathy.  Neurological: He is alert and oriented to person, place, and time. He has normal reflexes. No cranial nerve deficit. He exhibits normal muscle tone. Coordination normal.  Skin: Skin is warm and dry. No rash noted.  Psychiatric: His behavior is normal. Judgment and thought content normal.       sad          Assessment & Plan:

## 2010-09-10 NOTE — Assessment & Plan Note (Addendum)
Will perform [place] He has a Urol appt pending in Sept 2012  Procedure Note :    Procedure :   Sonography examination of the bladder   Indication: urinary discomfort, h/o stones    Equipment used: Sonosite M-Turbo with P21x/5-1 MHz transducer array probe. The images were stored in the unit and later transferred in storage.  The patient was placed in a decubitus position.   This study revealed a hyperechotic   2.49 x 1.0  cm lesion in the bladder, mobile   Impression: urinary calculus in the bladder, large

## 2010-09-11 ENCOUNTER — Encounter: Payer: Self-pay | Admitting: Internal Medicine

## 2010-09-11 ENCOUNTER — Ambulatory Visit (INDEPENDENT_AMBULATORY_CARE_PROVIDER_SITE_OTHER): Payer: 59 | Admitting: Internal Medicine

## 2010-09-11 VITALS — BP 160/80 | HR 72 | Temp 97.9°F | Resp 16

## 2010-09-11 DIAGNOSIS — N50819 Testicular pain, unspecified: Secondary | ICD-10-CM

## 2010-09-11 DIAGNOSIS — M545 Low back pain, unspecified: Secondary | ICD-10-CM

## 2010-09-11 DIAGNOSIS — N509 Disorder of male genital organs, unspecified: Secondary | ICD-10-CM

## 2010-09-11 DIAGNOSIS — N281 Cyst of kidney, acquired: Secondary | ICD-10-CM | POA: Insufficient documentation

## 2010-09-11 MED ORDER — PREDNISONE 10 MG PO TABS: ORAL_TABLET | ORAL | Status: AC

## 2010-09-11 MED ORDER — HYDROCODONE-ACETAMINOPHEN 7.5-750 MG PO TABS: 1.0000 | ORAL_TABLET | Freq: Three times a day (TID) | ORAL | Status: DC | PRN

## 2010-09-11 NOTE — Assessment & Plan Note (Signed)
He had seen Dr Tawanna Cooler - given Vicodin

## 2010-09-11 NOTE — Patient Instructions (Signed)
Stretch back and hips

## 2010-09-11 NOTE — Assessment & Plan Note (Signed)
Procedure Note :    Procedure :   Sonography examination B kidneys     Indication: LBP, kidney cyst    Equipment used: Sonosite M-Turbo with P21x/5-1 MHz transducer array probe. The images were stored in the unit and later transferred in storage.  The patient was placed in a decubitus position.   This study revealed a hypoechotic    7.2 x 7.5  cm lesion in the upper pole of his R kidney. No hydronephrosis.   L kidney with a 4 mm stone in the upper pole. No hydronephrosis.   Impression: R kidney large upper pole simple cyst. L kidney small non obstructing stone

## 2010-09-11 NOTE — Assessment & Plan Note (Signed)
Will perform [place] to r/o serious pathology. We diagnosed epididymitis. Abx, support underwear and NSAID were  prescribed   Procedure Note :    Procedure :   Sonography examination   Indication: Testicular pain on Left    Equipment used: Sonosite M-Turbo with an HFL38x/13-6 MHz transducer linear probe. The images were stored in the unit and later transferred in storage.  The patient was placed in the decubitus position.   This study revealed a normal R testicle with mild rete testes dilatation in R epididymis. L testis exam was normal except for probe presser elicited pain over L epididymis which had a substantial rete testis dilatation.  Impression: B rete testis dilatation L>>R, c/w prior vasectomy. R epididymitis

## 2010-09-11 NOTE — Assessment & Plan Note (Signed)
Resolving

## 2010-09-11 NOTE — Progress Notes (Signed)
  Subjective:    Patient ID: Paul [name], male    DOB: 1945/11/14, 65 y.o.   MRN: [id number]  Back Pain Pertinent negatives include no chest pain, dysuria or weakness.     No LBP at present, no fever, no dysuria C/o severe LBP and R LE pain - above knee - 9/10 at times He had seen Dr Tawanna Cooler He had injections 3 y ago that helped  Dr Wynetta Emery Review of Systems  Constitutional: Negative for chills, appetite change, fatigue and unexpected weight change.  HENT: Negative for nosebleeds, congestion, sore throat, sneezing, trouble swallowing and neck pain.   Eyes: Negative for itching and visual disturbance.  Respiratory: Negative for cough.   Cardiovascular: Negative for chest pain, palpitations and leg swelling.  Gastrointestinal: Negative for nausea, diarrhea, blood in stool and abdominal distention.  Genitourinary: Negative for dysuria, urgency, frequency, hematuria, penile swelling, scrotal swelling, difficulty urinating, penile pain and testicular pain (as above).  Musculoskeletal: Positive for back pain and gait problem. Negative for joint swelling.  Skin: Negative for rash.  Neurological: Negative for dizziness, tremors, speech difficulty and weakness.  Psychiatric/Behavioral: Negative for suicidal ideas, sleep disturbance, dysphoric mood and agitation. The patient is not nervous/anxious.        Objective:   Physical Exam  Constitutional: He is oriented to person, place, and time. He appears well-developed.       NAD  HENT:  Mouth/Throat: Oropharynx is clear and moist.  Eyes: Conjunctivae are normal. Pupils are equal, round, and reactive to light.  Neck: Normal range of motion. No JVD present. No thyromegaly present.  Cardiovascular: Normal rate, regular rhythm, normal heart sounds and intact distal pulses.  Exam reveals no gallop and no friction rub.   No murmur heard. Pulmonary/Chest: Effort normal and breath sounds normal. No respiratory distress. He has no wheezes. He has no  rales. He exhibits no tenderness.  Abdominal: Soft. Bowel sounds are normal. He exhibits no distension and no mass. There is no tenderness. There is no rebound and no guarding.  Genitourinary: Penis normal. No penile tenderness.       L testis is tender over the epidydimis  Musculoskeletal: Normal range of motion. He exhibits tenderness. He exhibits no edema.       LS is tender R LE str leg elev is positive  Lymphadenopathy:    He has no cervical adenopathy.  Neurological: He is alert and oriented to person, place, and time. He has normal reflexes. No cranial nerve deficit. He exhibits normal muscle tone. Coordination normal.  Skin: Skin is warm and dry. No rash noted.  Psychiatric: His behavior is normal. Judgment and thought content normal.       sad          Assessment & Plan:

## 2010-10-16 ENCOUNTER — Encounter: Payer: Self-pay | Admitting: Internal Medicine

## 2010-10-16 ENCOUNTER — Ambulatory Visit (INDEPENDENT_AMBULATORY_CARE_PROVIDER_SITE_OTHER): Payer: 59 | Admitting: Internal Medicine

## 2010-10-16 DIAGNOSIS — N209 Urinary calculus, unspecified: Secondary | ICD-10-CM

## 2010-10-16 DIAGNOSIS — M545 Low back pain, unspecified: Secondary | ICD-10-CM

## 2010-10-16 DIAGNOSIS — G8929 Other chronic pain: Secondary | ICD-10-CM | POA: Insufficient documentation

## 2010-10-16 MED ORDER — MELOXICAM 15 MG PO TABS: 15.0000 mg | ORAL_TABLET | Freq: Every day | ORAL | Status: AC | PRN

## 2010-10-16 NOTE — Assessment & Plan Note (Signed)
Better now. Chiropr cons if not better to try gentle manipulation for stiffness Dr Wynetta Emery if worse

## 2010-10-16 NOTE — Assessment & Plan Note (Signed)
80% better after Prednisone and other Rx

## 2010-10-16 NOTE — Assessment & Plan Note (Signed)
Urol appt pending 

## 2010-10-16 NOTE — Progress Notes (Signed)
  Subjective:    Patient ID: Paul [name], male    DOB: 1945-10-21, 65 y.o.   MRN: [id number]  HPI    The patient is here to follow up on chronic bladder calculus and chronic moderate LBP symptoms controlled with medicines, diet and exercise. Better after Predn - still stiff Review of Systems  Constitutional: Negative for appetite change, fatigue and unexpected weight change.  HENT: Negative for nosebleeds, congestion, sore throat, sneezing, trouble swallowing and neck pain.   Eyes: Negative for itching and visual disturbance.  Respiratory: Negative for cough.   Cardiovascular: Negative for chest pain, palpitations and leg swelling.  Gastrointestinal: Negative for nausea, diarrhea, blood in stool and abdominal distention.  Genitourinary: Negative for frequency and hematuria.  Musculoskeletal: Positive for back pain (R buttock) and gait problem. Negative for joint swelling.  Skin: Negative for rash.  Neurological: Negative for dizziness, tremors, speech difficulty and weakness.  Psychiatric/Behavioral: Negative for sleep disturbance, dysphoric mood and agitation. The patient is not nervous/anxious.        Objective:   Physical Exam  Musculoskeletal: He exhibits tenderness (LS is NT; R buttock is tender).          Assessment & Plan:

## 2010-11-11 ENCOUNTER — Other Ambulatory Visit: Payer: Self-pay | Admitting: Internal Medicine

## 2010-11-14 ENCOUNTER — Telehealth: Payer: Self-pay | Admitting: *Deleted

## 2010-11-14 MED ORDER — ZOLPIDEM TARTRATE 10 MG PO TABS: 5.0000 mg | ORAL_TABLET | Freq: Every evening | ORAL | Status: DC | PRN

## 2010-11-14 NOTE — Telephone Encounter (Signed)
OK to fill this prescription with additional refills x3 Thank you!  

## 2010-11-14 NOTE — Telephone Encounter (Signed)
       Requested Medications     zolpidem (AMBIEN) 10 MG tablet [Pharmacy Med Name: ZOLPIDEM 10MG  TABLETS]    TAKE 1/2 OR 1 TABLET BY MOUTH EVERY NIGHT AT BEDTIME AS NEEDED    Disp: 30 tablet R: 0 Start: 11/11/2010 Class: Normal    Originally ordered on: 04/17/2010 Last refill: 06/29/2009 Order History

## 2010-11-27 ENCOUNTER — Telehealth: Payer: Self-pay | Admitting: *Deleted

## 2010-11-27 DIAGNOSIS — Z0389 Encounter for observation for other suspected diseases and conditions ruled out: Secondary | ICD-10-CM

## 2010-11-27 DIAGNOSIS — Z0000 Encounter for general adult medical examination without abnormal findings: Secondary | ICD-10-CM

## 2010-11-27 NOTE — Telephone Encounter (Signed)
Labs entered.

## 2010-11-27 NOTE — Telephone Encounter (Signed)
Message copied by Merrilyn Puma on Mon Nov 27, 2010  3:02 PM ------      Message from: Etheleen Sia      Created: Mon Nov 27, 2010  9:10 AM      Regarding: PHYSICAL LAB       OCT 29 PHYSICAL / PT STILL WORKS AND HAS [employer]

## 2010-11-28 ENCOUNTER — Ambulatory Visit: Payer: 59 | Admitting: Internal Medicine

## 2010-12-08 ENCOUNTER — Other Ambulatory Visit: Payer: Self-pay | Admitting: Internal Medicine

## 2010-12-16 ENCOUNTER — Emergency Department: Payer: Self-pay | Admitting: *Deleted

## 2011-01-04 ENCOUNTER — Other Ambulatory Visit (INDEPENDENT_AMBULATORY_CARE_PROVIDER_SITE_OTHER): Payer: 59

## 2011-01-04 ENCOUNTER — Other Ambulatory Visit: Payer: Self-pay | Admitting: Internal Medicine

## 2011-01-04 DIAGNOSIS — Z0000 Encounter for general adult medical examination without abnormal findings: Secondary | ICD-10-CM

## 2011-01-04 DIAGNOSIS — Z0389 Encounter for observation for other suspected diseases and conditions ruled out: Secondary | ICD-10-CM

## 2011-01-04 LAB — URINALYSIS, ROUTINE W REFLEX MICROSCOPIC
Hgb urine dipstick: NEGATIVE
Leukocytes, UA: NEGATIVE
Nitrite: NEGATIVE
Total Protein, Urine: NEGATIVE

## 2011-01-04 LAB — CBC WITH DIFFERENTIAL/PLATELET
Basophils Absolute: 0.1 10*3/uL (ref 0.0–0.1)
Eosinophils Absolute: 0.1 10*3/uL (ref 0.0–0.7)
MCHC: 34.3 g/dL (ref 30.0–36.0)
MCV: 90.8 fl (ref 78.0–100.0)
Monocytes Absolute: 0.6 10*3/uL (ref 0.1–1.0)
Neutrophils Relative %: 60.5 % (ref 43.0–77.0)
Platelets: 264 10*3/uL (ref 150.0–400.0)
RDW: 12.2 % (ref 11.5–14.6)
WBC: 6.6 10*3/uL (ref 4.5–10.5)

## 2011-01-04 LAB — BASIC METABOLIC PANEL
BUN: 23 mg/dL (ref 6–23)
CO2: 28 mEq/L (ref 19–32)
Calcium: 9.2 mg/dL (ref 8.4–10.5)
Creatinine, Ser: 1 mg/dL (ref 0.4–1.5)

## 2011-01-04 LAB — HEPATIC FUNCTION PANEL
ALT: 20 U/L (ref 0–53)
Bilirubin, Direct: 0.1 mg/dL (ref 0.0–0.3)
Total Protein: 6.7 g/dL (ref 6.0–8.3)

## 2011-01-04 LAB — LDL CHOLESTEROL, DIRECT: Direct LDL: 133 mg/dL

## 2011-01-04 LAB — LIPID PANEL
Cholesterol: 261 mg/dL — ABNORMAL HIGH (ref 0–200)
Triglycerides: 344 mg/dL — ABNORMAL HIGH (ref 0.0–149.0)

## 2011-01-04 LAB — PSA: PSA: 0.81 ng/mL (ref 0.10–4.00)

## 2011-01-08 ENCOUNTER — Encounter: Payer: Self-pay | Admitting: Internal Medicine

## 2011-01-08 ENCOUNTER — Ambulatory Visit (INDEPENDENT_AMBULATORY_CARE_PROVIDER_SITE_OTHER): Payer: 59 | Admitting: Internal Medicine

## 2011-01-08 VITALS — BP 158/98 | HR 72 | Temp 97.6°F | Wt 209.0 lb

## 2011-01-08 DIAGNOSIS — G4700 Insomnia, unspecified: Secondary | ICD-10-CM

## 2011-01-08 DIAGNOSIS — Z136 Encounter for screening for cardiovascular disorders: Secondary | ICD-10-CM

## 2011-01-08 DIAGNOSIS — M545 Low back pain, unspecified: Secondary | ICD-10-CM

## 2011-01-08 DIAGNOSIS — E785 Hyperlipidemia, unspecified: Secondary | ICD-10-CM

## 2011-01-08 DIAGNOSIS — N209 Urinary calculus, unspecified: Secondary | ICD-10-CM

## 2011-01-08 DIAGNOSIS — Z0000 Encounter for general adult medical examination without abnormal findings: Secondary | ICD-10-CM

## 2011-01-08 DIAGNOSIS — G8929 Other chronic pain: Secondary | ICD-10-CM

## 2011-01-08 DIAGNOSIS — Z23 Encounter for immunization: Secondary | ICD-10-CM

## 2011-01-08 DIAGNOSIS — N281 Cyst of kidney, acquired: Secondary | ICD-10-CM

## 2011-01-08 MED ORDER — VALSARTAN 320 MG PO TABS: 320.0000 mg | ORAL_TABLET | Freq: Every day | ORAL | Status: DC

## 2011-01-08 MED ORDER — ATORVASTATIN CALCIUM 10 MG PO TABS: 10.0000 mg | ORAL_TABLET | Freq: Every day | ORAL | Status: DC

## 2011-01-08 MED ORDER — FLUTICASONE PROPIONATE 50 MCG/ACT NA SUSP: 1.0000 | Freq: Every day | NASAL | Status: DC

## 2011-01-08 NOTE — Assessment & Plan Note (Signed)
Urol appt is pending soon

## 2011-01-08 NOTE — Assessment & Plan Note (Signed)
We discussed age appropriate health related issues, including available/recomended screening tests and vaccinations. We discussed a need for adhering to healthy diet and exercise. Labs/EKG were reviewed/ordered. All questions were answered.   

## 2011-01-08 NOTE — Assessment & Plan Note (Signed)
Continue with current prescription therapy as reflected on the Med list.  

## 2011-01-08 NOTE — Progress Notes (Signed)
  Subjective:    Patient ID: Paul [name], male    DOB: [birth year], 65 y.o.   MRN: [id number]  HPI  The patient is here for a wellness exam. The patient has been doing well overall without major physical or psychological issues going on lately. The patient needs to address  chronic hypertension that has been well controlled with medicines; to address chronic depression controlled with medicines as well; and to address type migrainesReview of Systems  Constitutional: Negative for appetite change, fatigue and unexpected weight change.  HENT: Negative for nosebleeds, congestion, sore throat, sneezing, trouble swallowing and neck pain.   Eyes: Negative for itching and visual disturbance.  Respiratory: Negative for cough.   Cardiovascular: Negative for chest pain, palpitations and leg swelling.  Gastrointestinal: Negative for nausea, diarrhea, blood in stool and abdominal distention.  Genitourinary: Negative for frequency and hematuria.  Musculoskeletal: Negative for back pain, joint swelling and gait problem.  Skin: Negative for rash.  Neurological: Negative for dizziness, tremors, speech difficulty and weakness.  Psychiatric/Behavioral: Positive for sleep disturbance. Negative for dysphoric mood and agitation. The patient is nervous/anxious.        Objective:   Physical Exam  Constitutional: He is oriented to person, place, and time. He appears well-developed.  HENT:  Mouth/Throat: Oropharynx is clear and moist.  Eyes: Conjunctivae are normal. Pupils are equal, round, and reactive to light.  Neck: Normal range of motion. No JVD present. No thyromegaly present.  Cardiovascular: Normal rate, regular rhythm, normal heart sounds and intact distal pulses.  Exam reveals no gallop and no friction rub.   No murmur heard. Pulmonary/Chest: Effort normal and breath sounds normal. No respiratory distress. He has no wheezes. He has no rales. He exhibits no tenderness.  Abdominal: Soft. Bowel sounds  are normal. He exhibits no distension and no mass. There is no tenderness. There is no rebound and no guarding.  Musculoskeletal: Normal range of motion. He exhibits no edema and no tenderness.  Lymphadenopathy:    He has no cervical adenopathy.  Neurological: He is alert and oriented to person, place, and time. He has normal reflexes. No cranial nerve deficit. He exhibits normal muscle tone. Coordination normal.  Skin: Skin is warm and dry. No rash noted.  Psychiatric: He has a normal mood and affect. His behavior is normal. Judgment and thought content normal.  Rectal and GU exam is per urology  Lab Results  Component Value Date   WBC 6.6 01/04/2011   HGB 15.6 01/04/2011   HCT 45.3 01/04/2011   PLT 264.0 01/04/2011   GLUCOSE 93 01/04/2011   CHOL 261* 01/04/2011   TRIG 344.0* 01/04/2011   HDL 41.90 01/04/2011   LDLDIRECT 133.0 01/04/2011   LDLCALC 127* 01/16/2008   ALT 20 01/04/2011   AST 24 01/04/2011   NA 138 01/04/2011   K 4.7 01/04/2011   CL 103 01/04/2011   CREATININE 1.0 01/04/2011   BUN 23 01/04/2011   CO2 28 01/04/2011   TSH 1.96 01/04/2011   PSA 0.81 01/04/2011         Assessment & Plan:

## 2011-01-08 NOTE — Assessment & Plan Note (Signed)
Urol appt is better

## 2011-01-09 ENCOUNTER — Ambulatory Visit: Payer: Self-pay | Admitting: Urology

## 2011-01-11 HISTORY — PX: ASPIRATION / INJECTION RENAL CYST: SUR114

## 2011-01-15 ENCOUNTER — Telehealth: Payer: Self-pay | Admitting: *Deleted

## 2011-01-15 NOTE — Telephone Encounter (Signed)
PA for Atorvastatin: Called [facility] concerning PA; informed that local pharmacy is only approved for 30-day supply, 90-day supply must go through mail order for approval [on a rolling month system].  [facility] has filled the 30-day supply and 90-day supply through [facility] is available for refill on 02/07/11.

## 2011-01-20 ENCOUNTER — Other Ambulatory Visit: Payer: Self-pay | Admitting: Internal Medicine

## 2011-01-22 ENCOUNTER — Other Ambulatory Visit: Payer: Self-pay | Admitting: *Deleted

## 2011-01-22 MED ORDER — FINASTERIDE 5 MG PO TABS: 5.0000 mg | ORAL_TABLET | Freq: Every day | ORAL | Status: DC

## 2011-01-23 ENCOUNTER — Ambulatory Visit (INDEPENDENT_AMBULATORY_CARE_PROVIDER_SITE_OTHER): Payer: 59 | Admitting: Licensed Clinical Social Worker

## 2011-01-23 DIAGNOSIS — F331 Major depressive disorder, recurrent, moderate: Secondary | ICD-10-CM

## 2011-01-23 DIAGNOSIS — F411 Generalized anxiety disorder: Secondary | ICD-10-CM

## 2011-01-29 ENCOUNTER — Ambulatory Visit: Payer: Self-pay | Admitting: Urology

## 2011-01-30 ENCOUNTER — Ambulatory Visit: Payer: Self-pay | Admitting: Urology

## 2011-02-06 ENCOUNTER — Telehealth: Payer: Self-pay | Admitting: *Deleted

## 2011-02-06 MED ORDER — VALSARTAN 320 MG PO TABS: 320.0000 mg | ORAL_TABLET | Freq: Every day | ORAL | Status: DC

## 2011-02-06 NOTE — Telephone Encounter (Signed)
Request Rx for Diovan to [facility] in [place] from 10.29.12 OV that did not reach pharmacy. Rx Done. [see note for son as well]

## 2011-02-07 ENCOUNTER — Ambulatory Visit: Payer: Self-pay | Admitting: Urology

## 2011-02-10 ENCOUNTER — Other Ambulatory Visit: Payer: Self-pay | Admitting: Internal Medicine

## 2011-02-10 HISTORY — PX: BLADDER STONE REMOVAL: SHX568

## 2011-02-10 HISTORY — PX: PROSTATE SURGERY: SHX751

## 2011-04-12 ENCOUNTER — Ambulatory Visit (INDEPENDENT_AMBULATORY_CARE_PROVIDER_SITE_OTHER): Payer: 59 | Admitting: Internal Medicine

## 2011-04-12 ENCOUNTER — Encounter: Payer: Self-pay | Admitting: Internal Medicine

## 2011-04-12 VITALS — BP 150/100 | HR 80 | Temp 97.7°F | Resp 16 | Wt 207.0 lb

## 2011-04-12 DIAGNOSIS — N509 Disorder of male genital organs, unspecified: Secondary | ICD-10-CM

## 2011-04-12 DIAGNOSIS — M545 Low back pain, unspecified: Secondary | ICD-10-CM

## 2011-04-12 DIAGNOSIS — F411 Generalized anxiety disorder: Secondary | ICD-10-CM

## 2011-04-12 DIAGNOSIS — N281 Cyst of kidney, acquired: Secondary | ICD-10-CM

## 2011-04-12 DIAGNOSIS — G4700 Insomnia, unspecified: Secondary | ICD-10-CM

## 2011-04-12 DIAGNOSIS — N50819 Testicular pain, unspecified: Secondary | ICD-10-CM

## 2011-04-12 DIAGNOSIS — Z87898 Personal history of other specified conditions: Secondary | ICD-10-CM

## 2011-04-12 DIAGNOSIS — M26629 Arthralgia of temporomandibular joint, unspecified side: Secondary | ICD-10-CM

## 2011-04-12 MED ORDER — BUPROPION HCL ER (XL) 300 MG PO TB24: 300.0000 mg | ORAL_TABLET | ORAL | Status: DC

## 2011-04-12 MED ORDER — ZOLPIDEM TARTRATE 10 MG PO TABS: 5.0000 mg | ORAL_TABLET | Freq: Every evening | ORAL | Status: DC | PRN

## 2011-04-12 MED ORDER — AZELASTINE HCL 0.05 % OP SOLN: 1.0000 [drp] | Freq: Two times a day (BID) | OPHTHALMIC | Status: DC

## 2011-04-12 MED ORDER — FINASTERIDE 5 MG PO TABS: 5.0000 mg | ORAL_TABLET | Freq: Every day | ORAL | Status: DC

## 2011-04-12 MED ORDER — HYDROCODONE-ACETAMINOPHEN 7.5-750 MG PO TABS: 1.0000 | ORAL_TABLET | Freq: Three times a day (TID) | ORAL | Status: DC | PRN

## 2011-04-12 NOTE — Progress Notes (Signed)
  Subjective:    Patient ID: Paul [name], male    DOB: 08/15/1945, 66 y.o.   MRN: [id number]  HPI  The patient presents for a follow-up of  chronic hypertension, chronic dyslipidemia, BPH, kidney stones, depression, LBP controlled with medicines    Review of Systems  Constitutional: Negative for appetite change, fatigue and unexpected weight change.  HENT: Negative for nosebleeds, congestion, sore throat, sneezing, trouble swallowing and neck pain.   Eyes: Negative for itching and visual disturbance.  Respiratory: Negative for cough.   Cardiovascular: Negative for chest pain, palpitations and leg swelling.  Gastrointestinal: Negative for nausea, diarrhea, blood in stool and abdominal distention.  Genitourinary: Negative for frequency and hematuria.  Musculoskeletal: Negative for back pain, joint swelling and gait problem.  Skin: Negative for rash.  Neurological: Negative for dizziness, tremors, speech difficulty and weakness.  Psychiatric/Behavioral: Negative for suicidal ideas, sleep disturbance, dysphoric mood and agitation. The patient is not nervous/anxious.    R TMJ    Objective:   Physical Exam  Constitutional: He is oriented to person, place, and time. He appears well-developed.  HENT:  Mouth/Throat: Oropharynx is clear and moist.  Eyes: Conjunctivae are normal. Pupils are equal, round, and reactive to light.  Neck: Normal range of motion. No JVD present. No thyromegaly present.  Cardiovascular: Normal rate, regular rhythm, normal heart sounds and intact distal pulses.  Exam reveals no gallop and no friction rub.   No murmur heard. Pulmonary/Chest: Effort normal and breath sounds normal. No respiratory distress. He has no wheezes. He has no rales. He exhibits no tenderness.  Abdominal: Soft. Bowel sounds are normal. He exhibits no distension and no mass. There is no tenderness. There is no rebound and no guarding.  Musculoskeletal: Normal range of motion. He exhibits no  edema and no tenderness.  Lymphadenopathy:    He has no cervical adenopathy.  Neurological: He is alert and oriented to person, place, and time. He has normal reflexes. No cranial nerve deficit. He exhibits normal muscle tone. Coordination abnormal.  Skin: Skin is warm and dry. No rash noted.  Psychiatric: He has a normal mood and affect. His behavior is normal. Judgment and thought content normal.          Assessment & Plan:

## 2011-04-14 ENCOUNTER — Other Ambulatory Visit: Payer: Self-pay | Admitting: Internal Medicine

## 2011-04-17 DIAGNOSIS — M26629 Arthralgia of temporomandibular joint, unspecified side: Secondary | ICD-10-CM | POA: Insufficient documentation

## 2011-04-17 NOTE — Assessment & Plan Note (Signed)
resolved 

## 2011-04-17 NOTE — Assessment & Plan Note (Signed)
Monitoring

## 2011-04-17 NOTE — Assessment & Plan Note (Signed)
Continue with current prescription therapy as reflected on the Med list.  

## 2011-04-17 NOTE — Assessment & Plan Note (Signed)
On Proscar 

## 2011-04-17 NOTE — Assessment & Plan Note (Signed)
Soft diet

## 2011-05-04 ENCOUNTER — Other Ambulatory Visit: Payer: Self-pay | Admitting: Internal Medicine

## 2011-05-07 ENCOUNTER — Telehealth: Payer: Self-pay | Admitting: *Deleted

## 2011-05-07 NOTE — Telephone Encounter (Signed)
OK to fill this prescription with additional refills x5 Thank you!  

## 2011-05-07 NOTE — Telephone Encounter (Signed)
Requested Medications     zolpidem (AMBIEN) 10 MG tablet [Pharmacy Med Name: ZOLPIDEM TARTRATE 10 MG TABLET]   take 1/2 to 1 tablet by mouth at bedtime if needed   Disp: 30 tablet R: 2 Start: 05/04/2011  Class: Normal   Requested on: 11/14/2010   Originally ordered on: 04/17/2010  Last refill: 03/29/2011

## 2011-05-08 ENCOUNTER — Other Ambulatory Visit: Payer: Self-pay | Admitting: Internal Medicine

## 2011-05-09 MED ORDER — ZOLPIDEM TARTRATE 10 MG PO TABS: 5.0000 mg | ORAL_TABLET | Freq: Every evening | ORAL | Status: DC | PRN

## 2011-05-09 NOTE — Telephone Encounter (Signed)
RX called in .

## 2011-05-10 ENCOUNTER — Telehealth: Payer: Self-pay | Admitting: *Deleted

## 2011-05-10 NOTE — Telephone Encounter (Signed)
Pt states that he needs prescriptions for Optivar eye drops and Diovan changed to formulary alternatives because they are in the highest tier with his insurance and he cannot afford copays. Formulary alternative for Optivar is Ketorolac Tromethamine opthalmic solution and formulary alternatives for Diovan include Clonidine, Losartan, Nadolol. Pt states there are other alternatives for Diovan and that he will fax list if MD is needed.

## 2011-05-11 MED ORDER — LOSARTAN POTASSIUM 100 MG PO TABS: 100.0000 mg | ORAL_TABLET | Freq: Every day | ORAL | Status: DC

## 2011-05-11 MED ORDER — KETOROLAC TROMETHAMINE 0.4 % OP SOLN - NO CHARGE: 1.0000 [drp] | Freq: Two times a day (BID) | OPHTHALMIC | Status: DC | PRN

## 2011-05-11 NOTE — Telephone Encounter (Signed)
Pt informed of new medications.  

## 2011-05-11 NOTE — Telephone Encounter (Signed)
Ok ketorolac and Losartan - done Thx

## 2011-07-30 ENCOUNTER — Ambulatory Visit: Payer: 59 | Admitting: Internal Medicine

## 2011-08-23 ENCOUNTER — Other Ambulatory Visit: Payer: Self-pay

## 2011-08-23 NOTE — Telephone Encounter (Signed)
Start Buspar 1/2 bid x 1 wk, then 1 bid Thx

## 2011-08-23 NOTE — Telephone Encounter (Signed)
Pt called requesting Rx to help with stress. Pt states that this has been discussed several times in office but he did not feel it was necessary until now.

## 2011-08-24 MED ORDER — BUSPIRONE HCL 15 MG PO TABS: 15.0000 mg | ORAL_TABLET | Freq: Two times a day (BID) | ORAL | Status: DC

## 2011-08-24 NOTE — Telephone Encounter (Signed)
Notified pt with md response. rx sent to pharmacy... 08/24/11@11 :23am/LMB

## 2011-10-09 ENCOUNTER — Telehealth: Payer: Self-pay | Admitting: *Deleted

## 2011-10-09 NOTE — Telephone Encounter (Signed)
Rf req for zolpidem 10 mg 1/2-1 po qhs. Ok to Rf?

## 2011-10-10 NOTE — Telephone Encounter (Signed)
OK to fill this prescription with additional refills x5 Thank you!  

## 2011-10-11 MED ORDER — ZOLPIDEM TARTRATE 10 MG PO TABS: 5.0000 mg | ORAL_TABLET | Freq: Every evening | ORAL | Status: DC | PRN

## 2011-10-11 NOTE — Telephone Encounter (Signed)
Done

## 2011-10-12 ENCOUNTER — Telehealth: Payer: Self-pay

## 2011-10-12 NOTE — Telephone Encounter (Signed)
Left message on machine for pt to return the call with further information

## 2011-10-12 NOTE — Telephone Encounter (Signed)
I just phoned in Rf to [facility] in [place], [place] on 10/11/11.

## 2011-10-12 NOTE — Telephone Encounter (Signed)
Pt called requesting Rx for Ambien to [facility] in [place]

## 2011-11-02 ENCOUNTER — Other Ambulatory Visit: Payer: Self-pay | Admitting: Internal Medicine

## 2012-01-07 ENCOUNTER — Ambulatory Visit (INDEPENDENT_AMBULATORY_CARE_PROVIDER_SITE_OTHER): Payer: 59 | Admitting: Internal Medicine

## 2012-01-07 ENCOUNTER — Encounter: Payer: Self-pay | Admitting: Internal Medicine

## 2012-01-07 VITALS — BP 150/80 | HR 84 | Temp 97.5°F | Resp 16 | Wt 207.0 lb

## 2012-01-07 DIAGNOSIS — M545 Low back pain, unspecified: Secondary | ICD-10-CM

## 2012-01-07 DIAGNOSIS — M25519 Pain in unspecified shoulder: Secondary | ICD-10-CM

## 2012-01-07 MED ORDER — IBUPROFEN 600 MG PO TABS: ORAL_TABLET | ORAL | Status: DC

## 2012-01-07 MED ORDER — HYDROCODONE-ACETAMINOPHEN 7.5-750 MG PO TABS: 1.0000 | ORAL_TABLET | Freq: Three times a day (TID) | ORAL | Status: DC | PRN

## 2012-01-07 NOTE — Progress Notes (Signed)
Patient ID: Paul [name], male   DOB: 1945/12/22, 66 y.o.   MRN: [id number]  Subjective:    Patient ID: Paul [name], male    DOB: 09-Sep-1945, 66 y.o.   MRN: [id number]  Arm Pain  Pertinent negatives include no chest pain.    The patient presents for a follow-up of  chronic hypertension, chronic dyslipidemia, BPH, kidney stones, depression, LBP controlled with medicines    Review of Systems  Constitutional: Negative for appetite change, fatigue and unexpected weight change.  HENT: Negative for nosebleeds, congestion, sore throat, sneezing, trouble swallowing and neck pain.   Eyes: Negative for itching and visual disturbance.  Respiratory: Negative for cough.   Cardiovascular: Negative for chest pain, palpitations and leg swelling.  Gastrointestinal: Negative for nausea, diarrhea, blood in stool and abdominal distention.  Genitourinary: Negative for frequency and hematuria.  Musculoskeletal: Negative for back pain, joint swelling and gait problem.  Skin: Negative for rash.  Neurological: Negative for dizziness, tremors, speech difficulty and weakness.  Psychiatric/Behavioral: Negative for suicidal ideas, disturbed wake/sleep cycle, dysphoric mood and agitation. The patient is not nervous/anxious.        Objective:   Physical Exam  Constitutional: He is oriented to person, place, and time. He appears well-developed.  HENT:  Mouth/Throat: Oropharynx is clear and moist.  Eyes: Conjunctivae normal are normal. Pupils are equal, round, and reactive to light.  Neck: Normal range of motion. No JVD present. No thyromegaly present.  Cardiovascular: Normal rate, regular rhythm, normal heart sounds and intact distal pulses.  Exam reveals no gallop and no friction rub.   No murmur heard. Pulmonary/Chest: Effort normal and breath sounds normal. No respiratory distress. He has no wheezes. He has no rales. He exhibits no tenderness.  Abdominal: Soft. Bowel sounds are normal. He exhibits no  distension and no mass. There is no tenderness. There is no rebound and no guarding.  Musculoskeletal: Normal range of motion. He exhibits no edema and no tenderness.  Lymphadenopathy:    He has no cervical adenopathy.  Neurological: He is alert and oriented to person, place, and time. He has normal reflexes. No cranial nerve deficit. He exhibits normal muscle tone. Coordination abnormal.  Skin: Skin is warm and dry. No rash noted.  Psychiatric: He has a normal mood and affect. His behavior is normal. Judgment and thought content normal.     Procedure :Joint Injection,   shoulder   Indication:  Subacromial bursitis with refractory  chronic pain.   Risks including unsuccessful procedure , bleeding, infection, bruising, skin atrophy and others were explained to the patient in detail as well as the benefits. Informed consent was obtained and signed.   Tthe patient was placed in a comfortable position. Lateral approach was used. Skin was prepped with Betadine and alcohol  and anesthetized with a cooling spray. Then, a 5 cc syringe with a 2 inch long 24-gauge needle was used for a joint injection.. The needle was advanced  Into the subacromial space.The bursa was injected with 3 mL of 2% lidocaine and 40 mg of Depo-Medrol .  Band-Aid was applied.   Tolerated well. Complications: None. Good pain relief following the procedure.   Postprocedure instructions :    A Band-Aid should be left on for 12 hours. Injection therapy is not a cure itself. It is used in conjunction with other modalities. You can use nonsteroidal anti-inflammatories like ibuprofen , hot and cold compresses. Rest is recommended in the next 24 hours. You need to report immediately  if fever, chills or any signs of infection develop.   antibiotic ointment and Telfa pad or a Band-Aid of appropriate size.   Please contact us if you notice collection of pus or  fever and chills, increased pain or redness, increased swelling in the  area.    Assessment & Plan:

## 2012-01-07 NOTE — Patient Instructions (Addendum)
   Postprocedure instructions :    A Band-Aid should be left on for 12 hours. Injection therapy is not a cure itself. It is used in conjunction with other modalities. You can use nonsteroidal anti-inflammatories like ibuprofen , hot and cold compresses. Rest is recommended in the next 24 hours. You need to report immediately  if fever, chills or any signs of infection develop.   antibiotic ointment and Telfa pad or a Band-Aid of appropriate size.   Please contact us if you notice collection of pus or  fever and chills, increased pain or redness, increased swelling in the area.

## 2012-01-15 ENCOUNTER — Encounter: Payer: Self-pay | Admitting: Internal Medicine

## 2012-01-15 MED ORDER — METHYLPREDNISOLONE ACETATE 80 MG/ML IJ SUSP: 40.0000 mg | Freq: Once | INTRAMUSCULAR | Status: DC

## 2012-01-18 ENCOUNTER — Telehealth: Payer: Self-pay | Admitting: Internal Medicine

## 2012-01-18 NOTE — Telephone Encounter (Signed)
Caller: Susan/Spouse; Patient Name: Paul [name]; PCP: [name], Alex (Adults only); Best Callback Phone Number: [phone]; Reason for call: Other. She states her husband awoke this morning 04:00 a.m.  with x3 , 50 cent piece size of blood from a nose . Lasting  3 minutes. She states his blood pressure was  156/88. She states he is under stress from work, a lot of projects.  LOV 01/07/12 (sick vist). Physicial 03/25/11.  Office blood pressures on 10/28 was  150/80  and 03/25/11 was 150/100.   No further bleeding. Emergent s/sx ruled out per Nosebleed Protocol with exception to "No symptoms presently but has concerns or request for furhter information".  Home care advice and and call back parameters per guideline reviewed. Wife verbalized understanding.  B/P log suggested along with follow up appointment.

## 2012-01-22 ENCOUNTER — Encounter: Payer: Self-pay | Admitting: Internal Medicine

## 2012-01-22 ENCOUNTER — Ambulatory Visit (INDEPENDENT_AMBULATORY_CARE_PROVIDER_SITE_OTHER): Payer: 59 | Admitting: Internal Medicine

## 2012-01-22 VITALS — BP 162/108 | HR 80 | Temp 97.0°F | Resp 16 | Wt 203.0 lb

## 2012-01-22 DIAGNOSIS — F329 Major depressive disorder, single episode, unspecified: Secondary | ICD-10-CM

## 2012-01-22 DIAGNOSIS — E785 Hyperlipidemia, unspecified: Secondary | ICD-10-CM

## 2012-01-22 DIAGNOSIS — F411 Generalized anxiety disorder: Secondary | ICD-10-CM

## 2012-01-22 DIAGNOSIS — I10 Essential (primary) hypertension: Secondary | ICD-10-CM | POA: Insufficient documentation

## 2012-01-22 MED ORDER — AMLODIPINE-OLMESARTAN 5-40 MG PO TABS: 1.0000 | ORAL_TABLET | Freq: Every day | ORAL | Status: DC

## 2012-01-22 NOTE — Assessment & Plan Note (Signed)
11/13 - worse D/c Losartan Start [name] 5/40 qd

## 2012-01-22 NOTE — Assessment & Plan Note (Signed)
Continue with current prescription therapy as reflected on the Med list.  

## 2012-01-22 NOTE — Progress Notes (Signed)
  Subjective:    Patient ID: Paul [name], male    DOB: Sep 13, 1945, 66 y.o.   MRN: [id number]  HPI  C/o elev BP during the day. BP is low in am and at night The patient presents for a follow-up of  chronic hypertension, chronic dyslipidemia, BPH, kidney stones, depression, LBP controlled with medicines    Review of Systems  Constitutional: Negative for appetite change, fatigue and unexpected weight change.  HENT: Negative for nosebleeds, congestion, sore throat, sneezing, trouble swallowing and neck pain.   Eyes: Negative for itching and visual disturbance.  Respiratory: Negative for cough.   Cardiovascular: Negative for chest pain, palpitations and leg swelling.  Gastrointestinal: Negative for nausea, diarrhea, blood in stool and abdominal distention.  Genitourinary: Negative for frequency and hematuria.  Musculoskeletal: Negative for back pain, joint swelling and gait problem.  Skin: Negative for rash.  Neurological: Negative for dizziness, tremors, speech difficulty and weakness.  Psychiatric/Behavioral: Negative for suicidal ideas, sleep disturbance, dysphoric mood and agitation. The patient is not nervous/anxious.        Objective:   Physical Exam  Constitutional: He is oriented to person, place, and time. He appears well-developed.  HENT:  Mouth/Throat: Oropharynx is clear and moist.  Eyes: Conjunctivae normal are normal. Pupils are equal, round, and reactive to light.  Neck: Normal range of motion. No JVD present. No thyromegaly present.  Cardiovascular: Normal rate, regular rhythm, normal heart sounds and intact distal pulses.  Exam reveals no gallop and no friction rub.   No murmur heard. Pulmonary/Chest: Effort normal and breath sounds normal. No respiratory distress. He has no wheezes. He has no rales. He exhibits no tenderness.  Abdominal: Soft. Bowel sounds are normal. He exhibits no distension and no mass. There is no tenderness. There is no rebound and no  guarding.  Musculoskeletal: Normal range of motion. He exhibits no edema and no tenderness.  Lymphadenopathy:    He has no cervical adenopathy.  Neurological: He is alert and oriented to person, place, and time. He has normal reflexes. No cranial nerve deficit. He exhibits normal muscle tone. Coordination abnormal.  Skin: Skin is warm and dry. No rash noted.  Psychiatric: He has a normal mood and affect. His behavior is normal. Judgment and thought content normal.          Assessment & Plan:

## 2012-03-04 ENCOUNTER — Other Ambulatory Visit: Payer: Self-pay | Admitting: *Deleted

## 2012-03-04 MED ORDER — FINASTERIDE 5 MG PO TABS: 5.0000 mg | ORAL_TABLET | Freq: Every day | ORAL | Status: DC

## 2012-03-13 ENCOUNTER — Telehealth: Payer: Self-pay | Admitting: *Deleted

## 2012-03-13 NOTE — Telephone Encounter (Signed)
[name] 5-40 mg PA is approved 03/13/12 until 03/13/2013. Pharmacy informed.

## 2012-03-17 ENCOUNTER — Encounter: Payer: Self-pay | Admitting: Internal Medicine

## 2012-03-25 ENCOUNTER — Telehealth: Payer: Self-pay | Admitting: *Deleted

## 2012-03-25 ENCOUNTER — Encounter: Payer: 59 | Admitting: Internal Medicine

## 2012-03-25 DIAGNOSIS — Z0389 Encounter for observation for other suspected diseases and conditions ruled out: Secondary | ICD-10-CM

## 2012-03-25 DIAGNOSIS — Z0000 Encounter for general adult medical examination without abnormal findings: Secondary | ICD-10-CM

## 2012-03-25 NOTE — Telephone Encounter (Signed)
Message copied by Merrilyn Puma on Tue Mar 25, 2012  9:02 AM ------      Message from: Newell Coral      Created: Fri Mar 21, 2012  4:28 PM      Regarding: cpe       The pt scheduled a cpe and is hoping to get lab work done too

## 2012-03-25 NOTE — Telephone Encounter (Signed)
Labs entered.

## 2012-04-04 ENCOUNTER — Other Ambulatory Visit: Payer: Self-pay | Admitting: Internal Medicine

## 2012-04-08 ENCOUNTER — Encounter: Payer: Self-pay | Admitting: Internal Medicine

## 2012-04-08 ENCOUNTER — Ambulatory Visit (INDEPENDENT_AMBULATORY_CARE_PROVIDER_SITE_OTHER): Payer: 59 | Admitting: Internal Medicine

## 2012-04-08 VITALS — BP 148/98 | HR 80 | Temp 98.0°F | Resp 16 | Ht 73.0 in | Wt 204.0 lb

## 2012-04-08 DIAGNOSIS — R209 Unspecified disturbances of skin sensation: Secondary | ICD-10-CM

## 2012-04-08 DIAGNOSIS — Z136 Encounter for screening for cardiovascular disorders: Secondary | ICD-10-CM

## 2012-04-08 DIAGNOSIS — M545 Low back pain, unspecified: Secondary | ICD-10-CM

## 2012-04-08 DIAGNOSIS — F3289 Other specified depressive episodes: Secondary | ICD-10-CM

## 2012-04-08 DIAGNOSIS — I10 Essential (primary) hypertension: Secondary | ICD-10-CM

## 2012-04-08 DIAGNOSIS — Z23 Encounter for immunization: Secondary | ICD-10-CM

## 2012-04-08 DIAGNOSIS — Z0000 Encounter for general adult medical examination without abnormal findings: Secondary | ICD-10-CM

## 2012-04-08 DIAGNOSIS — F329 Major depressive disorder, single episode, unspecified: Secondary | ICD-10-CM

## 2012-04-08 DIAGNOSIS — R202 Paresthesia of skin: Secondary | ICD-10-CM

## 2012-04-08 DIAGNOSIS — G4700 Insomnia, unspecified: Secondary | ICD-10-CM

## 2012-04-08 DIAGNOSIS — G8929 Other chronic pain: Secondary | ICD-10-CM

## 2012-04-08 NOTE — Assessment & Plan Note (Signed)
We discussed age appropriate health related issues, including available/recomended screening tests and vaccinations. We discussed a need for adhering to healthy diet and exercise. Labs/EKG were reviewed/ordered. All questions were answered.  Colon is due - he got a recall letter from [facility]

## 2012-04-08 NOTE — Progress Notes (Signed)
  Subjective:    HPI  The patient is here for a wellness exam. The patient has been doing well overall without major physical or psychological issues going on lately. BP is OK at home.  The patient presents for a follow-up of  chronic hypertension, chronic dyslipidemia, BPH, kidney stones, depression, LBP controlled with medicines  Wt Readings from Last 3 Encounters:  04/08/12 204 lb (92.534 kg)  01/22/12 203 lb (92.08 kg)  01/07/12 207 lb (93.895 kg)   BP Readings from Last 3 Encounters:  04/08/12 148/98  01/22/12 162/108  01/07/12 150/80      Review of Systems  Constitutional: Negative for appetite change, fatigue and unexpected weight change.  HENT: Negative for nosebleeds, congestion, sore throat, sneezing, trouble swallowing and neck pain.   Eyes: Negative for itching and visual disturbance.  Respiratory: Negative for cough.   Cardiovascular: Negative for chest pain, palpitations and leg swelling.  Gastrointestinal: Negative for nausea, diarrhea, blood in stool and abdominal distention.  Genitourinary: Negative for frequency and hematuria.  Musculoskeletal: Negative for back pain, joint swelling and gait problem.  Skin: Negative for rash.  Neurological: Negative for dizziness, tremors, speech difficulty and weakness.  Psychiatric/Behavioral: Negative for suicidal ideas, sleep disturbance, dysphoric mood and agitation. The patient is not nervous/anxious.        Objective:   Physical Exam  Constitutional: He is oriented to person, place, and time. He appears well-developed.  HENT:  Mouth/Throat: Oropharynx is clear and moist.  Eyes: Conjunctivae normal are normal. Pupils are equal, round, and reactive to light.  Neck: Normal range of motion. No JVD present. No thyromegaly present.  Cardiovascular: Normal rate, regular rhythm, normal heart sounds and intact distal pulses.  Exam reveals no gallop and no friction rub.   No murmur heard. Pulmonary/Chest: Effort normal and  breath sounds normal. No respiratory distress. He has no wheezes. He has no rales. He exhibits no tenderness.  Abdominal: Soft. Bowel sounds are normal. He exhibits no distension and no mass. There is no tenderness. There is no rebound and no guarding.  Musculoskeletal: Normal range of motion. He exhibits no edema and no tenderness.  Lymphadenopathy:    He has no cervical adenopathy.  Neurological: He is alert and oriented to person, place, and time. He has normal reflexes. No cranial nerve deficit. He exhibits normal muscle tone. Coordination abnormal.  Skin: Skin is warm and dry. No rash noted.  Psychiatric: He has a normal mood and affect. His behavior is normal. Judgment and thought content normal.  Rectal - it was recently done by Urology Lab Results  Component Value Date   WBC 6.6 01/04/2011   HGB 15.6 01/04/2011   HCT 45.3 01/04/2011   PLT 264.0 01/04/2011   GLUCOSE 93 01/04/2011   CHOL 261* 01/04/2011   TRIG 344.0* 01/04/2011   HDL 41.90 01/04/2011   LDLDIRECT 133.0 01/04/2011   LDLCALC 127* 01/16/2008   ALT 20 01/04/2011   AST 24 01/04/2011   NA 138 01/04/2011   K 4.7 01/04/2011   CL 103 01/04/2011   CREATININE 1.0 01/04/2011   BUN 23 01/04/2011   CO2 28 01/04/2011   TSH 1.96 01/04/2011   PSA 0.81 01/04/2011         Assessment & Plan:

## 2012-04-08 NOTE — Assessment & Plan Note (Signed)
Continue with current prescription therapy as reflected on the Med list.  

## 2012-04-10 ENCOUNTER — Other Ambulatory Visit (INDEPENDENT_AMBULATORY_CARE_PROVIDER_SITE_OTHER): Payer: 59

## 2012-04-10 DIAGNOSIS — Z0000 Encounter for general adult medical examination without abnormal findings: Secondary | ICD-10-CM

## 2012-04-10 DIAGNOSIS — R202 Paresthesia of skin: Secondary | ICD-10-CM

## 2012-04-10 DIAGNOSIS — R209 Unspecified disturbances of skin sensation: Secondary | ICD-10-CM

## 2012-04-10 DIAGNOSIS — Z136 Encounter for screening for cardiovascular disorders: Secondary | ICD-10-CM

## 2012-04-10 LAB — URINALYSIS
Bilirubin Urine: NEGATIVE
Ketones, ur: NEGATIVE
Leukocytes, UA: NEGATIVE
Specific Gravity, Urine: >=1.03 (ref 1.000–1.030)
Urine Glucose: NEGATIVE
Urobilinogen, UA: 0.2 (ref 0.0–1.0)
pH: 6 (ref 5.0–8.0)

## 2012-04-10 LAB — LIPID PANEL
Cholesterol: 207 mg/dL — ABNORMAL HIGH (ref 0–200)
Total CHOL/HDL Ratio: 5
Triglycerides: 359 mg/dL — ABNORMAL HIGH (ref 0.0–149.0)

## 2012-04-10 LAB — HEPATIC FUNCTION PANEL
ALT: 23 U/L (ref 0–53)
AST: 26 U/L (ref 0–37)
Albumin: 4 g/dL (ref 3.5–5.2)
Alkaline Phosphatase: 53 U/L (ref 39–117)

## 2012-04-10 LAB — CBC WITH DIFFERENTIAL/PLATELET
Basophils Absolute: 0 10*3/uL (ref 0.0–0.1)
Basophils Relative: 0.5 % (ref 0.0–3.0)
Eosinophils Absolute: 0.2 10*3/uL (ref 0.0–0.7)
HCT: 43.4 % (ref 39.0–52.0)
Hemoglobin: 14.8 g/dL (ref 13.0–17.0)
Lymphocytes Relative: 27.6 % (ref 12.0–46.0)
Lymphs Abs: 1.9 10*3/uL (ref 0.7–4.0)
MCHC: 34 g/dL (ref 30.0–36.0)
MCV: 90.2 fl (ref 78.0–100.0)
Monocytes Absolute: 0.7 10*3/uL (ref 0.1–1.0)
Neutro Abs: 4 10*3/uL (ref 1.4–7.7)
RBC: 4.81 Mil/uL (ref 4.22–5.81)
RDW: 12.5 % (ref 11.5–14.6)

## 2012-04-10 LAB — BASIC METABOLIC PANEL
BUN: 23 mg/dL (ref 6–23)
Calcium: 9.4 mg/dL (ref 8.4–10.5)
Creatinine, Ser: 1 mg/dL (ref 0.4–1.5)

## 2012-04-10 LAB — TSH: TSH: 3.87 u[IU]/mL (ref 0.35–5.50)

## 2012-04-10 LAB — VITAMIN B12: Vitamin B-12: 338 pg/mL (ref 211–911)

## 2012-04-11 LAB — VITAMIN D 25 HYDROXY (VIT D DEFICIENCY, FRACTURES): Vit D, 25-Hydroxy: 34 ng/mL (ref 30–89)

## 2012-05-04 ENCOUNTER — Other Ambulatory Visit: Payer: Self-pay | Admitting: Internal Medicine

## 2012-05-05 ENCOUNTER — Other Ambulatory Visit: Payer: Self-pay | Admitting: Internal Medicine

## 2012-05-28 ENCOUNTER — Other Ambulatory Visit: Payer: Self-pay | Admitting: *Deleted

## 2012-05-28 MED ORDER — HYDROCODONE-ACETAMINOPHEN 7.5-325 MG PO TABS: 1.0000 | ORAL_TABLET | Freq: Four times a day (QID) | ORAL | Status: DC | PRN

## 2012-05-28 NOTE — Telephone Encounter (Signed)
Ok Th

## 2012-05-28 NOTE — Telephone Encounter (Signed)
Done

## 2012-05-28 NOTE — Telephone Encounter (Signed)
Rec fax from pharmacy requesting Rf on Hydroco/APAP 7.5/325 1 po q 8 hrs prn pain as 7.5/750 is no longer available. Please advise.

## 2012-07-04 ENCOUNTER — Other Ambulatory Visit: Payer: Self-pay | Admitting: Internal Medicine

## 2012-08-06 ENCOUNTER — Ambulatory Visit: Payer: Self-pay | Admitting: Ophthalmology

## 2012-08-18 ENCOUNTER — Ambulatory Visit: Payer: Self-pay | Admitting: Ophthalmology

## 2012-09-10 ENCOUNTER — Telehealth: Payer: Self-pay | Admitting: *Deleted

## 2012-09-10 MED ORDER — HYDROCODONE-ACETAMINOPHEN 7.5-325 MG PO TABS: 1.0000 | ORAL_TABLET | Freq: Four times a day (QID) | ORAL | Status: DC | PRN

## 2012-09-10 NOTE — Telephone Encounter (Signed)
Rf req for Hydroco/APAP 7.5/325 mg 1 po qid prn. Ok to Rf?

## 2012-09-10 NOTE — Telephone Encounter (Signed)
Unable to phone in to pharmacy- no answer. Pt informed Rx to be faxed.

## 2012-09-10 NOTE — Telephone Encounter (Signed)
OK to fill this prescription with additional refills x1 Thank you!  

## 2012-11-03 ENCOUNTER — Encounter: Payer: Self-pay | Admitting: Internal Medicine

## 2012-11-04 ENCOUNTER — Other Ambulatory Visit: Payer: Self-pay | Admitting: Internal Medicine

## 2012-11-14 ENCOUNTER — Telehealth: Payer: Self-pay | Admitting: *Deleted

## 2012-11-14 NOTE — Telephone Encounter (Signed)
Refill request for hydrocodone 7.5-325mg  Last OV 1.28.14 Last filled 7.2.14

## 2012-11-16 NOTE — Telephone Encounter (Signed)
OK to fill this prescription with additional refills x2 Thank you!  

## 2012-11-17 MED ORDER — HYDROCODONE-ACETAMINOPHEN 7.5-325 MG PO TABS: 1.0000 | ORAL_TABLET | Freq: Four times a day (QID) | ORAL | Status: DC | PRN

## 2012-11-17 NOTE — Telephone Encounter (Signed)
Refill done.  

## 2012-12-08 ENCOUNTER — Other Ambulatory Visit: Payer: Self-pay | Admitting: Internal Medicine

## 2012-12-12 NOTE — Telephone Encounter (Signed)
Called pharmacy had to leave request on pharmacy voicemail...lmb

## 2012-12-16 ENCOUNTER — Telehealth: Payer: Self-pay | Admitting: *Deleted

## 2012-12-16 MED ORDER — HYDROCODONE-ACETAMINOPHEN 7.5-325 MG PO TABS: 1.0000 | ORAL_TABLET | Freq: Four times a day (QID) | ORAL | Status: DC | PRN

## 2012-12-16 NOTE — Telephone Encounter (Signed)
Pt called requesting Hydrocodone refill.  Please advise 

## 2012-12-16 NOTE — Telephone Encounter (Signed)
Pt transferred to scheduling for appoint.  Advised Rx written

## 2012-12-16 NOTE — Telephone Encounter (Signed)
OK to fill this prescription with additional refills x0. Sch OV pls Thank you!  

## 2012-12-31 ENCOUNTER — Ambulatory Visit (INDEPENDENT_AMBULATORY_CARE_PROVIDER_SITE_OTHER): Payer: 59 | Admitting: Internal Medicine

## 2012-12-31 ENCOUNTER — Encounter: Payer: Self-pay | Admitting: Internal Medicine

## 2012-12-31 VITALS — BP 140/80 | HR 72 | Temp 98.2°F | Resp 16 | Wt 197.0 lb

## 2012-12-31 DIAGNOSIS — F329 Major depressive disorder, single episode, unspecified: Secondary | ICD-10-CM

## 2012-12-31 DIAGNOSIS — I10 Essential (primary) hypertension: Secondary | ICD-10-CM

## 2012-12-31 DIAGNOSIS — E785 Hyperlipidemia, unspecified: Secondary | ICD-10-CM

## 2012-12-31 DIAGNOSIS — Z23 Encounter for immunization: Secondary | ICD-10-CM

## 2012-12-31 MED ORDER — HYDROCODONE-ACETAMINOPHEN 7.5-325 MG PO TABS: 1.0000 | ORAL_TABLET | Freq: Four times a day (QID) | ORAL | Status: DC | PRN

## 2012-12-31 NOTE — Assessment & Plan Note (Signed)
Continue with current prescription therapy as reflected on the Med list.  

## 2012-12-31 NOTE — Progress Notes (Signed)
   Subjective:    HPI  The patient has been doing well overall without major physical or psychological issues going on lately. BP is OK at home.  The patient presents for a follow-up of  chronic hypertension, chronic dyslipidemia, BPH, kidney stones, depression, LBP controlled with medicines  Wt Readings from Last 3 Encounters:  12/31/12 197 lb (89.359 kg)  04/08/12 204 lb (92.534 kg)  01/22/12 203 lb (92.08 kg)   BP Readings from Last 3 Encounters:  12/31/12 140/80  04/08/12 148/98  01/22/12 162/108      Review of Systems  Constitutional: Negative for appetite change, fatigue and unexpected weight change.  HENT: Negative for congestion, nosebleeds, sneezing, sore throat and trouble swallowing.   Eyes: Negative for itching and visual disturbance.  Respiratory: Negative for cough.   Cardiovascular: Negative for chest pain, palpitations and leg swelling.  Gastrointestinal: Negative for nausea, diarrhea, blood in stool and abdominal distention.  Genitourinary: Negative for frequency and hematuria.  Musculoskeletal: Negative for back pain, gait problem, joint swelling and neck pain.  Skin: Negative for rash.  Neurological: Negative for dizziness, tremors, speech difficulty and weakness.  Psychiatric/Behavioral: Negative for suicidal ideas, sleep disturbance, dysphoric mood and agitation. The patient is not nervous/anxious.        Objective:   Physical Exam  Constitutional: He is oriented to person, place, and time. He appears well-developed.  HENT:  Mouth/Throat: Oropharynx is clear and moist.  Eyes: Conjunctivae are normal. Pupils are equal, round, and reactive to light.  Neck: Normal range of motion. No JVD present. No thyromegaly present.  Cardiovascular: Normal rate, regular rhythm, normal heart sounds and intact distal pulses.  Exam reveals no gallop and no friction rub.   No murmur heard. Pulmonary/Chest: Effort normal and breath sounds normal. No respiratory  distress. He has no wheezes. He has no rales. He exhibits no tenderness.  Abdominal: Soft. Bowel sounds are normal. He exhibits no distension and no mass. There is no tenderness. There is no rebound and no guarding.  Musculoskeletal: Normal range of motion. He exhibits no edema and no tenderness.  Lymphadenopathy:    He has no cervical adenopathy.  Neurological: He is alert and oriented to person, place, and time. He has normal reflexes. No cranial nerve deficit. He exhibits normal muscle tone. Coordination abnormal.  Skin: Skin is warm and dry. No rash noted.  Psychiatric: He has a normal mood and affect. His behavior is normal. Judgment and thought content normal.  Rectal - it was recently done by Urology  Lab Results  Component Value Date   WBC 6.7 04/10/2012   HGB 14.8 04/10/2012   HCT 43.4 04/10/2012   PLT 279.0 04/10/2012   GLUCOSE 113* 04/10/2012   CHOL 207* 04/10/2012   TRIG 359.0* 04/10/2012   HDL 41.40 04/10/2012   LDLDIRECT 85.7 04/10/2012   LDLCALC 127* 01/16/2008   ALT 23 04/10/2012   AST 26 04/10/2012   NA 139 04/10/2012   K 5.2* 04/10/2012   CL 105 04/10/2012   CREATININE 1.0 04/10/2012   BUN 23 04/10/2012   CO2 28 04/10/2012   TSH 3.87 04/10/2012   PSA 0.27 04/10/2012         Assessment & Plan:

## 2013-01-05 ENCOUNTER — Other Ambulatory Visit: Payer: Self-pay | Admitting: Internal Medicine

## 2013-01-29 ENCOUNTER — Encounter: Payer: Self-pay | Admitting: Internal Medicine

## 2013-02-03 ENCOUNTER — Other Ambulatory Visit: Payer: Self-pay

## 2013-02-03 ENCOUNTER — Telehealth: Payer: Self-pay

## 2013-02-03 MED ORDER — AMLODIPINE-OLMESARTAN 5-40 MG PO TABS: 1.0000 | ORAL_TABLET | Freq: Every day | ORAL | Status: DC

## 2013-02-03 NOTE — Telephone Encounter (Signed)
[name] script was faxed to pharmacy # [phone] on 02/03/2013 at 4:26 pm... met

## 2013-03-07 ENCOUNTER — Other Ambulatory Visit: Payer: Self-pay | Admitting: Internal Medicine

## 2013-03-10 ENCOUNTER — Telehealth: Payer: Self-pay | Admitting: *Deleted

## 2013-03-10 NOTE — Telephone Encounter (Signed)
OK to fill this prescription with additional refills x2 Thank you!  

## 2013-03-10 NOTE — Telephone Encounter (Signed)
Pt called states he does want to try Butrans.  Request Rx sent to [facility] on [facility] in [place].  Please advise

## 2013-03-11 MED ORDER — BUPRENORPHINE 10 MCG/HR TD PTWK: 10.0000 ug | MEDICATED_PATCH | TRANSDERMAL | Status: DC

## 2013-03-11 NOTE — Telephone Encounter (Signed)
Spoke with pt advised Rx ready for pick up 

## 2013-03-11 NOTE — Telephone Encounter (Signed)
I need specific medication instructions, route, dosage, quantity, Rx name.   Please

## 2013-03-11 NOTE — Telephone Encounter (Signed)
ok - will start w/10 mg Thx

## 2013-03-23 ENCOUNTER — Ambulatory Visit (AMBULATORY_SURGERY_CENTER): Payer: 59

## 2013-03-23 VITALS — Ht 72.0 in | Wt 200.0 lb

## 2013-03-23 DIAGNOSIS — Z860100 Personal history of colon polyps, unspecified: Secondary | ICD-10-CM

## 2013-03-23 MED ORDER — MOVIPREP 100 G PO SOLR: 1.0000 | Freq: Once | ORAL | Status: DC

## 2013-03-25 ENCOUNTER — Encounter: Payer: Self-pay | Admitting: Internal Medicine

## 2013-04-02 ENCOUNTER — Encounter: Payer: Self-pay | Admitting: Internal Medicine

## 2013-04-02 ENCOUNTER — Ambulatory Visit (AMBULATORY_SURGERY_CENTER): Payer: 59 | Admitting: Internal Medicine

## 2013-04-02 VITALS — BP 119/71 | HR 59 | Temp 97.0°F | Resp 15 | Ht 72.0 in | Wt 200.0 lb

## 2013-04-02 DIAGNOSIS — Z800 Family history of malignant neoplasm of digestive organs: Secondary | ICD-10-CM

## 2013-04-02 DIAGNOSIS — D126 Benign neoplasm of colon, unspecified: Secondary | ICD-10-CM

## 2013-04-02 DIAGNOSIS — Z86010 Personal history of colonic polyps: Secondary | ICD-10-CM

## 2013-04-02 MED ORDER — SODIUM CHLORIDE 0.9 % IV SOLN: 500.0000 mL | INTRAVENOUS | Status: DC

## 2013-04-02 NOTE — Op Note (Signed)
[facility]  [address] [place], [id number]   COLONOSCOPY PROCEDURE REPORT  PATIENT: Paul [name], Paul [name]  MR#: [id number] BIRTHDATE: 1945-09-12 , 67  yrs. old GENDER: Male ENDOSCOPIST: Eustace Quail, MD REFERRED BS:[id number] Program Recall PROCEDURE DATE:  04/02/2013 PROCEDURE:   Colonoscopy with snare polypectomy x1 First Screening Colonoscopy - Avg.  risk and is 50 yrs.  old or older - No.  Prior Negative Screening - Now for repeat screening. N/A  History of Adenoma - Now for follow-up colonoscopy & has been > or = to 3 yrs.  Yes hx of adenoma.  Has been 3 or more years since last colonoscopy.  Polyps Removed Today? Yes. ASA CLASS:   Class II INDICATIONS:Patient's immediate family history of colon cancer (father 46)and Patient's personal history of adenomatous colon polyps. Prior colonoscopies in 1998, 2003, 2011. Multiple adenomas.  MEDICATIONS: MAC sedation, administered by CRNA and propofol (Diprivan) 350mg  IV  DESCRIPTION OF PROCEDURE:   After the risks benefits and alternatives of the procedure were thoroughly explained, informed consent was obtained.  A digital rectal exam revealed no abnormalities of the rectum.   The LB [id number]  endoscope was introduced through the anus and advanced to the cecum, which was identified by both the appendix and ileocecal valve. No adverse events experienced.   The quality of the prep was excellent, using MoviPrep  The instrument was then slowly withdrawn as the colon was fully examined.  COLON FINDINGS: A diminutive polyp was found in the ascending colon. A polypectomy was performed with a cold snare.  The resection was complete and the polyp tissue was completely retrieved.   The colon mucosa was otherwise normal.  Retroflexed views revealed no abnormalities. The time to cecum=2 minutes 42 seconds.  Withdrawal time=13 minutes 59 seconds.  The scope was withdrawn and the procedure  completed. COMPLICATIONS: There were no complications.  ENDOSCOPIC IMPRESSION: 1.   Diminutive polyp was found in the ascending colon; polypectomy was performed with a cold snare 2.   The colon mucosa was otherwise normal  RECOMMENDATIONS: 1. Follow up colonoscopy in 5 years   eSigned:  Eustace Quail, MD 04/02/2013 11:57 AM   cc: Altamese Faith.  [name], MD and The Patient

## 2013-04-02 NOTE — Progress Notes (Signed)
Called to room to assist during endoscopic procedure.  Patient ID and intended procedure confirmed with present staff. Received instructions for my participation in the procedure from the performing physician.  

## 2013-04-02 NOTE — Progress Notes (Signed)
A/ox3 pleased with MAC, report to Jane RN 

## 2013-04-02 NOTE — Patient Instructions (Signed)
YOU HAD AN ENDOSCOPIC PROCEDURE TODAY AT THE [facility]: Refer to the procedure report that was given to you for any specific questions about what was found during the examination.  If the procedure report does not answer your questions, please call your gastroenterologist to clarify.  If you requested that your care partner not be given the details of your procedure findings, then the procedure report has been included in a sealed envelope for you to review at your convenience later.  YOU SHOULD EXPECT: Some feelings of bloating in the abdomen. Passage of more gas than usual.  Walking can help get rid of the air that was put into your GI tract during the procedure and reduce the bloating. If you had a lower endoscopy (such as a colonoscopy or flexible sigmoidoscopy) you may notice spotting of blood in your stool or on the toilet paper. If you underwent a bowel prep for your procedure, then you may not have a normal bowel movement for a few days.  DIET: Your first meal following the procedure should be a light meal and then it is ok to progress to your normal diet.  A half-sandwich or bowl of soup is an example of a good first meal.  Heavy or fried foods are harder to digest and may make you feel nauseous or bloated.  Likewise meals heavy in dairy and vegetables can cause extra gas to form and this can also increase the bloating.  Drink plenty of fluids but you should avoid alcoholic beverages for 24 hours.  ACTIVITY: Your care partner should take you home directly after the procedure.  You should plan to take it easy, moving slowly for the rest of the day.  You can resume normal activity the day after the procedure however you should NOT DRIVE or use heavy machinery for 24 hours (because of the sedation medicines used during the test).    SYMPTOMS TO REPORT IMMEDIATELY: A gastroenterologist can be reached at any hour.  During normal business hours, 8:30 AM to 5:00 PM Monday through Friday,  call [phone].  After hours and on weekends, please call the GI answering service at [phone] who will take a message and have the physician on call contact you.   Following lower endoscopy (colonoscopy or flexible sigmoidoscopy):  Excessive amounts of blood in the stool  Significant tenderness or worsening of abdominal pains  Swelling of the abdomen that is new, acute  Fever of 100F or higher    FOLLOW UP: If any biopsies were taken you will be contacted by phone or by letter within the next 1-3 weeks.  Call your gastroenterologist if you have not heard about the biopsies in 3 weeks.  Our staff will call the home number listed on your records the next business day following your procedure to check on you and address any questions or concerns that you may have at that time regarding the information given to you following your procedure. This is a courtesy call and so if there is no answer at the home number and we have not heard from you through the emergency physician on call, we will assume that you have returned to your regular daily activities without incident.  SIGNATURES/CONFIDENTIALITY: You and/or your care partner have signed paperwork which will be entered into your electronic medical record.  These signatures attest to the fact that that the information above on your After Visit Summary has been reviewed and is understood.  Full responsibility of the confidentiality   of this discharge information lies with you and/or your care-partner.    Information on polyps given to you today 

## 2013-04-03 ENCOUNTER — Encounter: Payer: Self-pay | Admitting: Internal Medicine

## 2013-04-03 ENCOUNTER — Ambulatory Visit (INDEPENDENT_AMBULATORY_CARE_PROVIDER_SITE_OTHER): Payer: 59 | Admitting: Internal Medicine

## 2013-04-03 ENCOUNTER — Telehealth: Payer: Self-pay | Admitting: *Deleted

## 2013-04-03 VITALS — BP 138/82 | HR 80 | Temp 98.0°F | Resp 16 | Wt 200.0 lb

## 2013-04-03 DIAGNOSIS — F3289 Other specified depressive episodes: Secondary | ICD-10-CM

## 2013-04-03 DIAGNOSIS — Z23 Encounter for immunization: Secondary | ICD-10-CM

## 2013-04-03 DIAGNOSIS — G8929 Other chronic pain: Secondary | ICD-10-CM

## 2013-04-03 DIAGNOSIS — M545 Low back pain, unspecified: Secondary | ICD-10-CM

## 2013-04-03 DIAGNOSIS — I10 Essential (primary) hypertension: Secondary | ICD-10-CM

## 2013-04-03 DIAGNOSIS — F329 Major depressive disorder, single episode, unspecified: Secondary | ICD-10-CM

## 2013-04-03 DIAGNOSIS — Z87898 Personal history of other specified conditions: Secondary | ICD-10-CM

## 2013-04-03 DIAGNOSIS — F411 Generalized anxiety disorder: Secondary | ICD-10-CM

## 2013-04-03 MED ORDER — HYDROCODONE-ACETAMINOPHEN 7.5-325 MG PO TABS: 1.0000 | ORAL_TABLET | Freq: Four times a day (QID) | ORAL | Status: DC | PRN

## 2013-04-03 NOTE — Assessment & Plan Note (Signed)
Continue with current prescription therapy as reflected on the Med list.  

## 2013-04-03 NOTE — Progress Notes (Signed)
Pre visit review using our clinic review tool, if applicable. No additional management support is needed unless otherwise documented below in the visit note. 

## 2013-04-03 NOTE — Progress Notes (Signed)
Patient ID: Paul [name], male   DOB: [birth year], 68 y.o.   MRN: [id number]   Subjective:    HPI  The patient has been doing well overall without major physical or psychological issues going on lately. BP is OK at home.  The patient presents for a follow-up of  chronic hypertension, chronic dyslipidemia, BPH, kidney stones, depression, LBP controlled with medicines  Wt Readings from Last 3 Encounters:  04/03/13 200 lb (90.719 kg)  04/02/13 200 lb (90.719 kg)  03/23/13 200 lb (90.719 kg)   BP Readings from Last 3 Encounters:  04/03/13 138/82  04/02/13 119/71  12/31/12 140/80      Review of Systems  Constitutional: Negative for appetite change, fatigue and unexpected weight change.  HENT: Negative for congestion, nosebleeds, sneezing, sore throat and trouble swallowing.   Eyes: Negative for itching and visual disturbance.  Respiratory: Negative for cough.   Cardiovascular: Negative for chest pain, palpitations and leg swelling.  Gastrointestinal: Negative for nausea, diarrhea, blood in stool and abdominal distention.  Genitourinary: Negative for frequency and hematuria.  Musculoskeletal: Negative for back pain, gait problem, joint swelling and neck pain.  Skin: Negative for rash.  Neurological: Negative for dizziness, tremors, speech difficulty and weakness.  Psychiatric/Behavioral: Negative for suicidal ideas, sleep disturbance, dysphoric mood and agitation. The patient is not nervous/anxious.        Objective:   Physical Exam  Constitutional: He is oriented to person, place, and time. He appears well-developed.  HENT:  Mouth/Throat: Oropharynx is clear and moist.  Eyes: Conjunctivae are normal. Pupils are equal, round, and reactive to light.  Neck: Normal range of motion. No JVD present. No thyromegaly present.  Cardiovascular: Normal rate, regular rhythm, normal heart sounds and intact distal pulses.  Exam reveals no gallop and no friction rub.   No murmur  heard. Pulmonary/Chest: Effort normal and breath sounds normal. No respiratory distress. He has no wheezes. He has no rales. He exhibits no tenderness.  Abdominal: Soft. Bowel sounds are normal. He exhibits no distension and no mass. There is no tenderness. There is no rebound and no guarding.  Musculoskeletal: Normal range of motion. He exhibits no edema and no tenderness.  Lymphadenopathy:    He has no cervical adenopathy.  Neurological: He is alert and oriented to person, place, and time. He has normal reflexes. No cranial nerve deficit. He exhibits normal muscle tone. Coordination abnormal.  Skin: Skin is warm and dry. No rash noted.  Psychiatric: He has a normal mood and affect. His behavior is normal. Judgment and thought content normal.  Rectal - it was recently done by GI  Lab Results  Component Value Date   WBC 6.7 04/10/2012   HGB 14.8 04/10/2012   HCT 43.4 04/10/2012   PLT 279.0 04/10/2012   GLUCOSE 113* 04/10/2012   CHOL 207* 04/10/2012   TRIG 359.0* 04/10/2012   HDL 41.40 04/10/2012   LDLDIRECT 85.7 04/10/2012   LDLCALC 127* 01/16/2008   ALT 23 04/10/2012   AST 26 04/10/2012   NA 139 04/10/2012   K 5.2* 04/10/2012   CL 105 04/10/2012   CREATININE 1.0 04/10/2012   BUN 23 04/10/2012   CO2 28 04/10/2012   TSH 3.87 04/10/2012   PSA 0.27 04/10/2012         Assessment & Plan:

## 2013-04-03 NOTE — Telephone Encounter (Signed)
  Follow up Call-  Call back number 04/02/2013  Post procedure Call Back phone  # [phone]  Permission to leave phone message Yes     Patient questions:  Do you have a fever, pain , or abdominal swelling? no Pain Score  0 *  Have you tolerated food without any problems? yes  Have you been able to return to your normal activities? yes  Do you have any questions about your discharge instructions: Diet   no Medications  no Follow up visit  no  Do you have questions or concerns about your Care? no  Actions: * If pain score is 4 or above: No action needed, pain <4.

## 2013-04-08 ENCOUNTER — Encounter: Payer: Self-pay | Admitting: Internal Medicine

## 2013-04-14 ENCOUNTER — Telehealth: Payer: Self-pay | Admitting: *Deleted

## 2013-04-14 NOTE — Telephone Encounter (Signed)
Patient phoned inquiring about prior authorization.  States that he had to go through the same PA process last month.  States if MD thinks med needs to be changed to prevent having to go through this process every month, he is willing for med to be changed to MD's recommendation.  Please advise.  CB# [phone]

## 2013-04-14 NOTE — Telephone Encounter (Signed)
What was it for? Thx

## 2013-04-14 NOTE — Telephone Encounter (Signed)
Sorry-thought I had included it in original message. It is for the [name] and he is completely OUT of his bp med.  Please advise.

## 2013-04-14 NOTE — Telephone Encounter (Signed)
Looked for samples for patient, none available.

## 2013-04-22 ENCOUNTER — Telehealth: Payer: Self-pay | Admitting: *Deleted

## 2013-04-22 NOTE — Telephone Encounter (Signed)
OK to change to Telmisartan and amlodipine generic: 1 a day each OK PA Thx

## 2013-04-22 NOTE — Telephone Encounter (Signed)
Patient phoned in regards to prior auth for his azor, or alternate rx. No samples available to provide and patient has been without his bp meds for over a week.  Please advise.   CB# [phone]

## 2013-04-23 MED ORDER — AMLODIPINE BESYLATE 5 MG PO TABS: 5.0000 mg | ORAL_TABLET | Freq: Every day | ORAL | Status: DC

## 2013-04-23 MED ORDER — TELMISARTAN 80 MG PO TABS: 80.0000 mg | ORAL_TABLET | Freq: Every day | ORAL | Status: DC

## 2013-04-23 NOTE — Telephone Encounter (Signed)
Please specify doses of each

## 2013-04-23 NOTE — Telephone Encounter (Signed)
Sent pended scripts to pharmacy & notified patient.

## 2013-05-04 ENCOUNTER — Other Ambulatory Visit: Payer: Self-pay | Admitting: Internal Medicine

## 2013-05-18 ENCOUNTER — Ambulatory Visit: Payer: Self-pay | Admitting: Ophthalmology

## 2013-06-15 ENCOUNTER — Ambulatory Visit: Payer: Self-pay | Admitting: Ophthalmology

## 2013-07-03 ENCOUNTER — Ambulatory Visit (INDEPENDENT_AMBULATORY_CARE_PROVIDER_SITE_OTHER): Payer: 59 | Admitting: Internal Medicine

## 2013-07-03 ENCOUNTER — Encounter: Payer: Self-pay | Admitting: Internal Medicine

## 2013-07-03 VITALS — BP 140/82 | HR 80 | Temp 97.7°F | Resp 16 | Wt 199.0 lb

## 2013-07-03 DIAGNOSIS — G8929 Other chronic pain: Secondary | ICD-10-CM

## 2013-07-03 DIAGNOSIS — M542 Cervicalgia: Secondary | ICD-10-CM

## 2013-07-03 DIAGNOSIS — M545 Low back pain, unspecified: Secondary | ICD-10-CM

## 2013-07-03 DIAGNOSIS — F411 Generalized anxiety disorder: Secondary | ICD-10-CM

## 2013-07-03 MED ORDER — HYDROCODONE-ACETAMINOPHEN 7.5-325 MG PO TABS: 1.0000 | ORAL_TABLET | Freq: Four times a day (QID) | ORAL | Status: DC | PRN

## 2013-07-03 NOTE — Assessment & Plan Note (Signed)
Continue with current prescription therapy as reflected on the Med list.  

## 2013-07-03 NOTE — Assessment & Plan Note (Signed)
4/15 L chronic MSK Contour pillow Heating pad

## 2013-07-03 NOTE — Progress Notes (Signed)
Pre visit review using our clinic review tool, if applicable. No additional management support is needed unless otherwise documented below in the visit note. 

## 2013-07-03 NOTE — Patient Instructions (Signed)
Contour pillow Heating pad

## 2013-07-03 NOTE — Progress Notes (Signed)
   Subjective:    HPI  The patient has been doing well overall without major physical or psychological issues going on lately. BP is OK at home.  The patient presents for a follow-up of  chronic hypertension, chronic dyslipidemia, BPH, kidney stones, depression, LBP controlled with medicines  Wt Readings from Last 3 Encounters:  07/03/13 199 lb (90.266 kg)  04/03/13 200 lb (90.719 kg)  04/02/13 200 lb (90.719 kg)   BP Readings from Last 3 Encounters:  07/03/13 140/82  04/03/13 138/82  04/02/13 119/71      Review of Systems  Constitutional: Negative for appetite change, fatigue and unexpected weight change.  HENT: Negative for congestion, nosebleeds, sneezing, sore throat and trouble swallowing.   Eyes: Negative for itching and visual disturbance.  Respiratory: Negative for cough.   Cardiovascular: Negative for chest pain, palpitations and leg swelling.  Gastrointestinal: Negative for nausea, diarrhea, blood in stool and abdominal distention.  Genitourinary: Negative for frequency and hematuria.  Musculoskeletal: Negative for back pain, gait problem, joint swelling and neck pain.  Skin: Negative for rash.  Neurological: Negative for dizziness, tremors, speech difficulty and weakness.  Psychiatric/Behavioral: Negative for suicidal ideas, sleep disturbance, dysphoric mood and agitation. The patient is not nervous/anxious.        Objective:   Physical Exam  Constitutional: He is oriented to person, place, and time. He appears well-developed.  HENT:  Mouth/Throat: Oropharynx is clear and moist.  Eyes: Conjunctivae are normal. Pupils are equal, round, and reactive to light.  Neck: Normal range of motion. No JVD present. No thyromegaly present.  Cardiovascular: Normal rate, regular rhythm, normal heart sounds and intact distal pulses.  Exam reveals no gallop and no friction rub.   No murmur heard. Pulmonary/Chest: Effort normal and breath sounds normal. No respiratory  distress. He has no wheezes. He has no rales. He exhibits no tenderness.  Abdominal: Soft. Bowel sounds are normal. He exhibits no distension and no mass. There is no tenderness. There is no rebound and no guarding.  Musculoskeletal: Normal range of motion. He exhibits no edema and no tenderness.  Lymphadenopathy:    He has no cervical adenopathy.  Neurological: He is alert and oriented to person, place, and time. He has normal reflexes. No cranial nerve deficit. He exhibits normal muscle tone. Coordination abnormal.  Skin: Skin is warm and dry. No rash noted.  Psychiatric: He has a normal mood and affect. His behavior is normal. Judgment and thought content normal.    Lab Results  Component Value Date   WBC 6.7 04/10/2012   HGB 14.8 04/10/2012   HCT 43.4 04/10/2012   PLT 279.0 04/10/2012   GLUCOSE 113* 04/10/2012   CHOL 207* 04/10/2012   TRIG 359.0* 04/10/2012   HDL 41.40 04/10/2012   LDLDIRECT 85.7 04/10/2012   LDLCALC 127* 01/16/2008   ALT 23 04/10/2012   AST 26 04/10/2012   NA 139 04/10/2012   K 5.2* 04/10/2012   CL 105 04/10/2012   CREATININE 1.0 04/10/2012   BUN 23 04/10/2012   CO2 28 04/10/2012   TSH 3.87 04/10/2012   PSA 0.27 04/10/2012         Assessment & Plan:

## 2013-07-07 ENCOUNTER — Other Ambulatory Visit: Payer: Self-pay | Admitting: *Deleted

## 2013-07-07 MED ORDER — ZOLPIDEM TARTRATE 10 MG PO TABS: ORAL_TABLET | ORAL | Status: DC

## 2013-07-08 NOTE — Telephone Encounter (Signed)
Called refill into pharmacy spoke with nick gave md approval.../lmb

## 2013-09-01 ENCOUNTER — Other Ambulatory Visit: Payer: Self-pay | Admitting: Internal Medicine

## 2013-10-02 ENCOUNTER — Encounter: Payer: Self-pay | Admitting: Internal Medicine

## 2013-10-02 ENCOUNTER — Ambulatory Visit (INDEPENDENT_AMBULATORY_CARE_PROVIDER_SITE_OTHER): Payer: 59 | Admitting: Internal Medicine

## 2013-10-02 VITALS — BP 148/90 | HR 80 | Temp 98.4°F | Resp 16 | Wt 199.0 lb

## 2013-10-02 DIAGNOSIS — M545 Low back pain, unspecified: Secondary | ICD-10-CM

## 2013-10-02 DIAGNOSIS — G8929 Other chronic pain: Secondary | ICD-10-CM

## 2013-10-02 DIAGNOSIS — K0520 Aggressive periodontitis, unspecified: Secondary | ICD-10-CM

## 2013-10-02 DIAGNOSIS — F411 Generalized anxiety disorder: Secondary | ICD-10-CM

## 2013-10-02 DIAGNOSIS — K05219 Aggressive periodontitis, localized, unspecified severity: Secondary | ICD-10-CM

## 2013-10-02 MED ORDER — CEPHALEXIN 500 MG PO CAPS: 500.0000 mg | ORAL_CAPSULE | Freq: Four times a day (QID) | ORAL | Status: DC

## 2013-10-02 MED ORDER — HYDROCODONE-ACETAMINOPHEN 7.5-325 MG PO TABS: 1.0000 | ORAL_TABLET | Freq: Four times a day (QID) | ORAL | Status: DC | PRN

## 2013-10-02 NOTE — Progress Notes (Signed)
   Subjective:    HPI  C/o a "cyst" on the R lower gum  The patient has been doing well overall without major physical or psychological issues going on lately. BP is OK at home.  The patient presents for a follow-up of  chronic hypertension, chronic dyslipidemia, BPH, kidney stones, depression, LBP controlled with medicines  Wt Readings from Last 3 Encounters:  10/02/13 199 lb (90.266 kg)  07/03/13 199 lb (90.266 kg)  04/03/13 200 lb (90.719 kg)   BP Readings from Last 3 Encounters:  10/02/13 148/90  07/03/13 140/82  04/03/13 138/82      Review of Systems  Constitutional: Negative for appetite change, fatigue and unexpected weight change.  HENT: Negative for congestion, nosebleeds, sneezing, sore throat and trouble swallowing.   Eyes: Negative for itching and visual disturbance.  Respiratory: Negative for cough.   Cardiovascular: Negative for chest pain, palpitations and leg swelling.  Gastrointestinal: Negative for nausea, diarrhea, blood in stool and abdominal distention.  Genitourinary: Negative for frequency and hematuria.  Musculoskeletal: Negative for back pain, gait problem, joint swelling and neck pain.  Skin: Negative for rash.  Neurological: Negative for dizziness, tremors, speech difficulty and weakness.  Psychiatric/Behavioral: Negative for suicidal ideas, sleep disturbance, dysphoric mood and agitation. The patient is not nervous/anxious.        Objective:   Physical Exam  Constitutional: He is oriented to person, place, and time. He appears well-developed.  HENT:  Mouth/Throat: Oropharynx is clear and moist.  Eyes: Conjunctivae are normal. Pupils are equal, round, and reactive to light.  Neck: Normal range of motion. No JVD present. No thyromegaly present.  Cardiovascular: Normal rate, regular rhythm, normal heart sounds and intact distal pulses.  Exam reveals no gallop and no friction rub.   No murmur heard. Pulmonary/Chest: Effort normal and breath  sounds normal. No respiratory distress. He has no wheezes. He has no rales. He exhibits no tenderness.  Abdominal: Soft. Bowel sounds are normal. He exhibits no distension and no mass. There is no tenderness. There is no rebound and no guarding.  Musculoskeletal: Normal range of motion. He exhibits no edema and no tenderness.  Lymphadenopathy:    He has no cervical adenopathy.  Neurological: He is alert and oriented to person, place, and time. He has normal reflexes. No cranial nerve deficit. He exhibits normal muscle tone. Coordination abnormal.  Skin: Skin is warm and dry. No rash noted.  Psychiatric: He has a normal mood and affect. His behavior is normal. Judgment and thought content normal.   a double "cyst" on the R lower gum - tender 8x5 mm  Lab Results  Component Value Date   WBC 6.7 04/10/2012   HGB 14.8 04/10/2012   HCT 43.4 04/10/2012   PLT 279.0 04/10/2012   GLUCOSE 113* 04/10/2012   CHOL 207* 04/10/2012   TRIG 359.0* 04/10/2012   HDL 41.40 04/10/2012   LDLDIRECT 85.7 04/10/2012   LDLCALC 127* 01/16/2008   ALT 23 04/10/2012   AST 26 04/10/2012   NA 139 04/10/2012   K 5.2* 04/10/2012   CL 105 04/10/2012   CREATININE 1.0 04/10/2012   BUN 23 04/10/2012   CO2 28 04/10/2012   TSH 3.87 04/10/2012   PSA 0.27 04/10/2012         Assessment & Plan:

## 2013-10-02 NOTE — Progress Notes (Signed)
Pre visit review using our clinic review tool, if applicable. No additional management support is needed unless otherwise documented below in the visit note. 

## 2013-10-02 NOTE — Assessment & Plan Note (Signed)
Continue with current prescription therapy as reflected on the Med list.  

## 2013-10-02 NOTE — Assessment & Plan Note (Signed)
7/15  a double "cyst" on the R lower gum - tender 8x5 mm  Oral surgery ref Po abx

## 2013-10-03 ENCOUNTER — Encounter: Payer: Self-pay | Admitting: Internal Medicine

## 2013-10-05 ENCOUNTER — Ambulatory Visit: Payer: 59 | Admitting: Internal Medicine

## 2013-11-18 ENCOUNTER — Encounter: Payer: Self-pay | Admitting: Internal Medicine

## 2013-12-04 ENCOUNTER — Telehealth: Payer: Self-pay | Admitting: *Deleted

## 2013-12-04 NOTE — Telephone Encounter (Signed)
[facility] Triage Call Report Triage Record Num: [id number] Operator: Evorn Gong Patient Name: Stancil Deisher Call Date & Time: 12/01/2013 6:15:17PM Patient Phone: [phone] PCP: Walker Kehr Patient Gender: Male PCP Fax : [phone] Patient DOB: [birth year] Practice Name: [facility] Reason for Call: Caller: Doil/Patient; PCP: Walker Kehr (Adults only); CB#: [phone]; Call regarding Patient is experiencing dizziness associated with his [name]. Pt states that he is not having any s/s of dizziness right now, but that he has the episodes sometimes when he is outside gardening, or when he bends down to pet the dog. Has noticed that they are coming a little more frequently over the last month. Triaged per Dizziness or Vertigo Guideline. Dispositioned to See provider within 72 hours per 'Symptoms follow sudden change in position and not previously evaluated". Home care advice given and call back parameters discussed. Advised pt to call and schedule appt with MD. Advised rising from laying/sitting position slowly and increased water intake. Pt verbalizes understanding and agrees with plan Protocol(s) Used: Dizziness or Vertigo Recommended Outcome per Protocol: See Provider within 72 Hours Reason for Outcome: Symptom(s) follow sudden change in position AND not previously evaluated Care Advice: Avoid caffeine (coffee, tea, cola drinks, or chocolate), alcohol, and nicotine (use of tobacco), as use of these substances may worsen symptoms. ~ ~ SYMPTOM / CONDITION MANAGEMENT A temporary drop in blood pressure sometimes occurs with a quick change to an upright position (postural hypotension) and may cause light-headedness or dizziness. Change position slowly. Making a habit of rising slowly and sitting for a few minutes before standing to walk usually relieves the feeling of faintness

## 2013-12-30 ENCOUNTER — Other Ambulatory Visit: Payer: Self-pay

## 2013-12-30 MED ORDER — ATORVASTATIN CALCIUM 10 MG PO TABS: ORAL_TABLET | ORAL | Status: DC

## 2014-01-04 ENCOUNTER — Encounter: Payer: Self-pay | Admitting: Internal Medicine

## 2014-01-04 ENCOUNTER — Other Ambulatory Visit (INDEPENDENT_AMBULATORY_CARE_PROVIDER_SITE_OTHER): Payer: 59

## 2014-01-04 ENCOUNTER — Ambulatory Visit (INDEPENDENT_AMBULATORY_CARE_PROVIDER_SITE_OTHER): Payer: 59 | Admitting: Internal Medicine

## 2014-01-04 VITALS — BP 140/82 | HR 80 | Temp 97.6°F | Resp 16 | Wt 204.0 lb

## 2014-01-04 DIAGNOSIS — N508 Other specified disorders of male genital organs: Secondary | ICD-10-CM

## 2014-01-04 DIAGNOSIS — Z23 Encounter for immunization: Secondary | ICD-10-CM

## 2014-01-04 DIAGNOSIS — N50819 Testicular pain, unspecified: Secondary | ICD-10-CM

## 2014-01-04 DIAGNOSIS — G4700 Insomnia, unspecified: Secondary | ICD-10-CM

## 2014-01-04 DIAGNOSIS — G8929 Other chronic pain: Secondary | ICD-10-CM

## 2014-01-04 DIAGNOSIS — E785 Hyperlipidemia, unspecified: Secondary | ICD-10-CM

## 2014-01-04 DIAGNOSIS — M545 Low back pain, unspecified: Secondary | ICD-10-CM

## 2014-01-04 LAB — TSH: TSH: 2.15 u[IU]/mL (ref 0.35–4.50)

## 2014-01-04 LAB — URINALYSIS
BILIRUBIN URINE: NEGATIVE
HGB URINE DIPSTICK: NEGATIVE
Ketones, ur: NEGATIVE
Leukocytes, UA: NEGATIVE
Nitrite: NEGATIVE
Specific Gravity, Urine: >=1.03 — AB (ref 1.000–1.030)
TOTAL PROTEIN, URINE-UPE24: NEGATIVE
URINE GLUCOSE: NEGATIVE
Urobilinogen, UA: 0.2 (ref 0.0–1.0)
pH: 6 (ref 5.0–8.0)

## 2014-01-04 LAB — HEPATIC FUNCTION PANEL
ALK PHOS: 56 U/L (ref 39–117)
ALT: 26 U/L (ref 0–53)
AST: 29 U/L (ref 0–37)
Albumin: 3.6 g/dL (ref 3.5–5.2)
Bilirubin, Direct: 0 mg/dL (ref 0.0–0.3)
Total Bilirubin: 0.6 mg/dL (ref 0.2–1.2)
Total Protein: 7 g/dL (ref 6.0–8.3)

## 2014-01-04 LAB — LIPID PANEL
CHOL/HDL RATIO: 5
Cholesterol: 176 mg/dL (ref 0–200)
HDL: 39.1 mg/dL (ref >39.00)
LDL Cholesterol: 98 mg/dL (ref 0–99)
NONHDL: 136.9
Triglycerides: 195 mg/dL — ABNORMAL HIGH (ref 0.0–149.0)
VLDL: 39 mg/dL (ref 0.0–40.0)

## 2014-01-04 LAB — BASIC METABOLIC PANEL
BUN: 24 mg/dL — ABNORMAL HIGH (ref 6–23)
CHLORIDE: 105 meq/L (ref 96–112)
CO2: 28 meq/L (ref 19–32)
CREATININE: 0.9 mg/dL (ref 0.4–1.5)
Calcium: 9.1 mg/dL (ref 8.4–10.5)
GFR: 93.6 mL/min (ref >60.00)
Glucose, Bld: 98 mg/dL (ref 70–99)
Potassium: 4 mEq/L (ref 3.5–5.1)
Sodium: 137 mEq/L (ref 135–145)

## 2014-01-04 MED ORDER — AMLODIPINE BESYLATE 5 MG PO TABS: 5.0000 mg | ORAL_TABLET | Freq: Every day | ORAL | Status: DC

## 2014-01-04 MED ORDER — TELMISARTAN 80 MG PO TABS: 80.0000 mg | ORAL_TABLET | Freq: Every day | ORAL | Status: DC

## 2014-01-04 NOTE — Assessment & Plan Note (Signed)
Potential benefits of a long term benzodiazepines  use as well as potential risks  and complications were explained to the patient and were aknowledged. 

## 2014-01-04 NOTE — Assessment & Plan Note (Signed)
Continue with current prescription therapy as reflected on the Med list.  

## 2014-01-04 NOTE — Progress Notes (Signed)
Pre visit review using our clinic review tool, if applicable. No additional management support is needed unless otherwise documented below in the visit note. 

## 2014-01-04 NOTE — Assessment & Plan Note (Signed)
No relapse Labs per wife's request ordered

## 2014-01-04 NOTE — Assessment & Plan Note (Signed)
Off pain meds since 9/15

## 2014-01-04 NOTE — Progress Notes (Signed)
   Subjective:    HPI   The patient has been doing well overall without major physical or psychological issues going on lately. BP is OK at home.  The patient presents for a follow-up of  chronic hypertension, chronic dyslipidemia, BPH, kidney stones, depression, LBP - off pain meds x6 weeks  Wt Readings from Last 3 Encounters:  01/04/14 204 lb (92.534 kg)  10/02/13 199 lb (90.266 kg)  07/03/13 199 lb (90.266 kg)   BP Readings from Last 3 Encounters:  01/04/14 140/82  10/02/13 148/90  07/03/13 140/82      Review of Systems  Constitutional: Negative for appetite change, fatigue and unexpected weight change.  HENT: Negative for congestion, nosebleeds, sneezing, sore throat and trouble swallowing.   Eyes: Negative for itching and visual disturbance.  Respiratory: Negative for cough.   Cardiovascular: Negative for chest pain, palpitations and leg swelling.  Gastrointestinal: Negative for nausea, diarrhea, blood in stool and abdominal distention.  Genitourinary: Negative for frequency and hematuria.  Musculoskeletal: Negative for back pain, gait problem, joint swelling and neck pain.  Skin: Negative for rash.  Neurological: Negative for dizziness, tremors, speech difficulty and weakness.  Psychiatric/Behavioral: Negative for suicidal ideas, sleep disturbance, dysphoric mood and agitation. The patient is not nervous/anxious.        Objective:   Physical Exam  Constitutional: He is oriented to person, place, and time. He appears well-developed.  HENT:  Mouth/Throat: Oropharynx is clear and moist.  Eyes: Conjunctivae are normal. Pupils are equal, round, and reactive to light.  Neck: Normal range of motion. No JVD present. No thyromegaly present.  Cardiovascular: Normal rate, regular rhythm, normal heart sounds and intact distal pulses.  Exam reveals no gallop and no friction rub.   No murmur heard. Pulmonary/Chest: Effort normal and breath sounds normal. No respiratory  distress. He has no wheezes. He has no rales. He exhibits no tenderness.  Abdominal: Soft. Bowel sounds are normal. He exhibits no distension and no mass. There is no tenderness. There is no rebound and no guarding.  Musculoskeletal: Normal range of motion. He exhibits no edema and no tenderness.  Lymphadenopathy:    He has no cervical adenopathy.  Neurological: He is alert and oriented to person, place, and time. He has normal reflexes. No cranial nerve deficit. He exhibits normal muscle tone. Coordination abnormal.  Skin: Skin is warm and dry. No rash noted.  Psychiatric: He has a normal mood and affect. His behavior is normal. Judgment and thought content normal.  AKs treated 1 wk ago  Lab Results  Component Value Date   WBC 6.7 04/10/2012   HGB 14.8 04/10/2012   HCT 43.4 04/10/2012   PLT 279.0 04/10/2012   GLUCOSE 113* 04/10/2012   CHOL 207* 04/10/2012   TRIG 359.0* 04/10/2012   HDL 41.40 04/10/2012   LDLDIRECT 85.7 04/10/2012   LDLCALC 127* 01/16/2008   ALT 23 04/10/2012   AST 26 04/10/2012   NA 139 04/10/2012   K 5.2* 04/10/2012   CL 105 04/10/2012   CREATININE 1.0 04/10/2012   BUN 23 04/10/2012   CO2 28 04/10/2012   TSH 3.87 04/10/2012   PSA 0.27 04/10/2012         Assessment & Plan:

## 2014-01-05 LAB — GC/CHLAMYDIA PROBE AMP, URINE
Chlamydia, Swab/Urine, PCR: NEGATIVE
GC Probe Amp, Urine: NEGATIVE

## 2014-01-22 ENCOUNTER — Telehealth: Payer: Self-pay | Admitting: *Deleted

## 2014-01-22 MED ORDER — AMLODIPINE-OLMESARTAN 5-40 MG PO TABS: 1.0000 | ORAL_TABLET | Freq: Every day | ORAL | Status: DC

## 2014-01-22 NOTE — Telephone Encounter (Signed)
[name] PA is approved effective today until 01/23/15. Ref # [id number]. Pt informed- New Rx sent.

## 2014-02-01 ENCOUNTER — Other Ambulatory Visit: Payer: Self-pay | Admitting: *Deleted

## 2014-02-01 MED ORDER — BUPROPION HCL ER (XL) 300 MG PO TB24: 300.0000 mg | ORAL_TABLET | Freq: Every day | ORAL | Status: DC

## 2014-02-11 ENCOUNTER — Other Ambulatory Visit (INDEPENDENT_AMBULATORY_CARE_PROVIDER_SITE_OTHER): Payer: 59

## 2014-02-11 ENCOUNTER — Ambulatory Visit (INDEPENDENT_AMBULATORY_CARE_PROVIDER_SITE_OTHER)
Admission: RE | Admit: 2014-02-11 | Discharge: 2014-02-11 | Disposition: A | Discharge status: Home / Self Care | Payer: 59 | Source: Ambulatory Visit | Attending: Internal Medicine | Admitting: Internal Medicine

## 2014-02-11 ENCOUNTER — Encounter: Payer: Self-pay | Admitting: Internal Medicine

## 2014-02-11 ENCOUNTER — Ambulatory Visit (INDEPENDENT_AMBULATORY_CARE_PROVIDER_SITE_OTHER): Payer: 59 | Admitting: Internal Medicine

## 2014-02-11 VITALS — BP 160/90 | HR 61 | Temp 98.1°F | Wt 206.0 lb

## 2014-02-11 DIAGNOSIS — R1310 Dysphagia, unspecified: Secondary | ICD-10-CM | POA: Diagnosis not present

## 2014-02-11 DIAGNOSIS — K2100 Gastro-esophageal reflux disease with esophagitis, without bleeding: Secondary | ICD-10-CM

## 2014-02-11 DIAGNOSIS — R05 Cough: Secondary | ICD-10-CM

## 2014-02-11 DIAGNOSIS — K219 Gastro-esophageal reflux disease without esophagitis: Secondary | ICD-10-CM | POA: Insufficient documentation

## 2014-02-11 DIAGNOSIS — R059 Cough, unspecified: Secondary | ICD-10-CM | POA: Insufficient documentation

## 2014-02-11 LAB — HEPATIC FUNCTION PANEL
ALK PHOS: 51 U/L (ref 39–117)
ALT: 24 U/L (ref 0–53)
AST: 33 U/L (ref 0–37)
Albumin: 4.1 g/dL (ref 3.5–5.2)
BILIRUBIN DIRECT: 0 mg/dL (ref 0.0–0.3)
TOTAL PROTEIN: 6.7 g/dL (ref 6.0–8.3)
Total Bilirubin: 1 mg/dL (ref 0.2–1.2)

## 2014-02-11 LAB — CBC WITH DIFFERENTIAL/PLATELET
BASOS PCT: 0.8 % (ref 0.0–3.0)
Basophils Absolute: 0.1 10*3/uL (ref 0.0–0.1)
EOS PCT: 2.4 % (ref 0.0–5.0)
Eosinophils Absolute: 0.2 10*3/uL (ref 0.0–0.7)
HCT: 44.6 % (ref 39.0–52.0)
Hemoglobin: 14.9 g/dL (ref 13.0–17.0)
LYMPHS PCT: 24.6 % (ref 12.0–46.0)
Lymphs Abs: 1.9 10*3/uL (ref 0.7–4.0)
MCHC: 33.3 g/dL (ref 30.0–36.0)
MCV: 90.2 fl (ref 78.0–100.0)
Monocytes Absolute: 0.9 10*3/uL (ref 0.1–1.0)
Monocytes Relative: 11.5 % (ref 3.0–12.0)
NEUTROS ABS: 4.7 10*3/uL (ref 1.4–7.7)
NEUTROS PCT: 60.7 % (ref 43.0–77.0)
Platelets: 300 10*3/uL (ref 150.0–400.0)
RBC: 4.95 Mil/uL (ref 4.22–5.81)
RDW: 12.2 % (ref 11.5–15.5)
WBC: 7.8 10*3/uL (ref 4.0–10.5)

## 2014-02-11 LAB — BASIC METABOLIC PANEL
BUN: 23 mg/dL (ref 6–23)
CHLORIDE: 103 meq/L (ref 96–112)
CO2: 27 mEq/L (ref 19–32)
CREATININE: 0.9 mg/dL (ref 0.4–1.5)
Calcium: 9 mg/dL (ref 8.4–10.5)
GFR: 84.44 mL/min (ref >60.00)
GLUCOSE: 96 mg/dL (ref 70–99)
POTASSIUM: 4.1 meq/L (ref 3.5–5.1)
Sodium: 137 mEq/L (ref 135–145)

## 2014-02-11 LAB — H. PYLORI ANTIBODY, IGG: H Pylori IgG: NEGATIVE

## 2014-02-11 MED ORDER — PANTOPRAZOLE SODIUM 40 MG PO TBEC: 40.0000 mg | DELAYED_RELEASE_TABLET | Freq: Every day | ORAL | Status: DC

## 2014-02-11 NOTE — Assessment & Plan Note (Signed)
Chronic - worse lately (12.15) Start Protonix GE ref

## 2014-02-11 NOTE — Assessment & Plan Note (Signed)
worse lately (12.15) Protonix Labs GI consult

## 2014-02-11 NOTE — Assessment & Plan Note (Signed)
CXR Protonix

## 2014-02-11 NOTE — Progress Notes (Signed)
   Subjective:    HPI  C/o GERD, "esophagus on fire", coughing spells x 2 month; can't swallow when it is flaring... Stress  The patient has been doing well overall without major physical or psychological issues going on lately. BP is OK at home.  The patient presents for a follow-up of  chronic hypertension, chronic dyslipidemia, BPH, kidney stones, depression, LBP - off pain meds x6 weeks  Wt Readings from Last 3 Encounters:  02/11/14 206 lb (93.441 kg)  01/04/14 204 lb (92.534 kg)  10/02/13 199 lb (90.266 kg)   BP Readings from Last 3 Encounters:  02/11/14 160/90  01/04/14 140/82  10/02/13 148/90      Review of Systems  Constitutional: Negative for appetite change, fatigue and unexpected weight change.  HENT: Negative for congestion, nosebleeds, sneezing, sore throat and trouble swallowing.   Eyes: Negative for itching and visual disturbance.  Respiratory: Negative for cough.   Cardiovascular: Negative for chest pain, palpitations and leg swelling.  Gastrointestinal: Negative for nausea, diarrhea, blood in stool and abdominal distention.  Genitourinary: Negative for frequency and hematuria.  Musculoskeletal: Negative for back pain, joint swelling, gait problem and neck pain.  Skin: Negative for rash.  Neurological: Negative for dizziness, tremors, speech difficulty and weakness.  Psychiatric/Behavioral: Negative for suicidal ideas, sleep disturbance, dysphoric mood and agitation. The patient is not nervous/anxious.        Objective:   Physical Exam  Constitutional: He is oriented to person, place, and time. He appears well-developed. No distress.  NAD  HENT:  Mouth/Throat: Oropharynx is clear and moist.  Eyes: Conjunctivae are normal. Pupils are equal, round, and reactive to light.  Neck: Normal range of motion. No JVD present. No thyromegaly present.  Cardiovascular: Normal rate, regular rhythm, normal heart sounds and intact distal pulses.  Exam reveals no gallop  and no friction rub.   No murmur heard. Pulmonary/Chest: Effort normal and breath sounds normal. No respiratory distress. He has no wheezes. He has no rales. He exhibits no tenderness.  Abdominal: Soft. Bowel sounds are normal. He exhibits no distension and no mass. There is no tenderness. There is no rebound and no guarding.  Musculoskeletal: Normal range of motion. He exhibits no edema or tenderness.  Lymphadenopathy:    He has no cervical adenopathy.  Neurological: He is alert and oriented to person, place, and time. He has normal reflexes. No cranial nerve deficit. He exhibits normal muscle tone. He displays a negative Romberg sign. Coordination and gait normal.  No meningeal signs  Skin: Skin is warm and dry. No rash noted.  Psychiatric: He has a normal mood and affect. His behavior is normal. Judgment and thought content normal.  AKs treated 1 wk ago  Lab Results  Component Value Date   WBC 6.7 04/10/2012   HGB 14.8 04/10/2012   HCT 43.4 04/10/2012   PLT 279.0 04/10/2012   GLUCOSE 98 01/04/2014   CHOL 176 01/04/2014   TRIG 195.0* 01/04/2014   HDL 39.10 01/04/2014   LDLDIRECT 85.7 04/10/2012   LDLCALC 98 01/04/2014   ALT 26 01/04/2014   AST 29 01/04/2014   NA 137 01/04/2014   K 4.0 01/04/2014   CL 105 01/04/2014   CREATININE 0.9 01/04/2014   BUN 24* 01/04/2014   CO2 28 01/04/2014   TSH 2.15 01/04/2014   PSA 0.27 04/10/2012         Assessment & Plan:

## 2014-02-11 NOTE — Progress Notes (Signed)
Pre visit review using our clinic review tool, if applicable. No additional management support is needed unless otherwise documented below in the visit note. 

## 2014-02-17 ENCOUNTER — Telehealth: Payer: Self-pay | Admitting: Internal Medicine

## 2014-02-17 NOTE — Telephone Encounter (Signed)
OK to fill this prescription with additional refills x5 Thank you!  

## 2014-02-17 NOTE — Telephone Encounter (Signed)
Patient states pharmacy has sent numerous request for ambien to be refilled.  Needs sent to [facility] as soon as possible.

## 2014-02-18 MED ORDER — ZOLPIDEM TARTRATE 10 MG PO TABS: ORAL_TABLET | ORAL | Status: DC

## 2014-02-18 NOTE — Telephone Encounter (Signed)
Had to leave refill on pharmacist vm. Called pt to inform med has been called in...Paul [name]

## 2014-03-12 HISTORY — PX: CATARACT EXTRACTION, BILATERAL: SHX1313

## 2014-03-15 ENCOUNTER — Other Ambulatory Visit: Payer: Self-pay

## 2014-03-15 MED ORDER — FINASTERIDE 5 MG PO TABS: 5.0000 mg | ORAL_TABLET | Freq: Every day | ORAL | Status: DC

## 2014-03-18 ENCOUNTER — Encounter: Payer: Self-pay | Admitting: Internal Medicine

## 2014-03-18 ENCOUNTER — Ambulatory Visit (INDEPENDENT_AMBULATORY_CARE_PROVIDER_SITE_OTHER): Payer: 59 | Admitting: Internal Medicine

## 2014-03-18 VITALS — BP 150/80 | HR 63 | Temp 98.0°F | Wt 208.0 lb

## 2014-03-18 DIAGNOSIS — J45909 Unspecified asthma, uncomplicated: Secondary | ICD-10-CM | POA: Insufficient documentation

## 2014-03-18 DIAGNOSIS — J4520 Mild intermittent asthma, uncomplicated: Secondary | ICD-10-CM

## 2014-03-18 DIAGNOSIS — R059 Cough, unspecified: Secondary | ICD-10-CM

## 2014-03-18 DIAGNOSIS — I10 Essential (primary) hypertension: Secondary | ICD-10-CM

## 2014-03-18 DIAGNOSIS — J302 Other seasonal allergic rhinitis: Secondary | ICD-10-CM

## 2014-03-18 DIAGNOSIS — K2100 Gastro-esophageal reflux disease with esophagitis, without bleeding: Secondary | ICD-10-CM

## 2014-03-18 DIAGNOSIS — R05 Cough: Secondary | ICD-10-CM

## 2014-03-18 MED ORDER — FLUTICASONE FUROATE-VILANTEROL 100-25 MCG/INH IN AEPB: 1.0000 | INHALATION_SPRAY | Freq: Every day | RESPIRATORY_TRACT | Status: DC

## 2014-03-18 MED ORDER — PANTOPRAZOLE SODIUM 40 MG PO TBEC: 40.0000 mg | DELAYED_RELEASE_TABLET | Freq: Two times a day (BID) | ORAL | Status: DC

## 2014-03-18 NOTE — Assessment & Plan Note (Addendum)
We treated GERD, "esophagus on fire" - Protonix helped a lot: coughing spells x 4 month - better He saw ENT (Dr Richardson Landry) Protonix bid Added [facility] [facility]

## 2014-03-18 NOTE — Assessment & Plan Note (Signed)
Continue with current prescription therapy as reflected on the Med list.  

## 2014-03-18 NOTE — Progress Notes (Signed)
Subjective:    HPI  F/u GERD, "esophagus on fire" - Protonix helped a lot: coughing spells x 2 month; can't swallow when it is flaring... Stress w/son. He saw ENT (Dr Richardson Landry). Remote h/o asthma.Marland KitchenMarland KitchenC/o wheezind  The patient has been doing well overall without major physical or psychological issues going on lately. BP is OK at home.  The patient presents for a follow-up of  chronic hypertension, chronic dyslipidemia, BPH, kidney stones, depression, LBP - off pain meds x6 weeks  Wt Readings from Last 3 Encounters:  03/18/14 208 lb (94.348 kg)  02/11/14 206 lb (93.441 kg)  01/04/14 204 lb (92.534 kg)   BP Readings from Last 3 Encounters:  03/18/14 150/80  02/11/14 160/90  01/04/14 140/82      Review of Systems  Constitutional: Negative for appetite change, fatigue and unexpected weight change.  HENT: Negative for congestion, nosebleeds, sneezing, sore throat and trouble swallowing.   Eyes: Negative for itching and visual disturbance.  Respiratory: Negative for cough.   Cardiovascular: Negative for chest pain, palpitations and leg swelling.  Gastrointestinal: Negative for nausea, diarrhea, blood in stool and abdominal distention.  Genitourinary: Negative for frequency and hematuria.  Musculoskeletal: Negative for back pain, joint swelling, gait problem and neck pain.  Skin: Negative for rash.  Neurological: Negative for dizziness, tremors, speech difficulty and weakness.  Psychiatric/Behavioral: Negative for suicidal ideas, sleep disturbance, dysphoric mood and agitation. The patient is not nervous/anxious.        Objective:   Physical Exam  Constitutional: He is oriented to person, place, and time. He appears well-developed. No distress.  NAD  HENT:  Mouth/Throat: Oropharynx is clear and moist.  Eyes: Conjunctivae are normal. Pupils are equal, round, and reactive to light.  Neck: Normal range of motion. No JVD present. No thyromegaly present.  Cardiovascular: Normal  rate, regular rhythm, normal heart sounds and intact distal pulses.  Exam reveals no gallop and no friction rub.   No murmur heard. Pulmonary/Chest: Effort normal and breath sounds normal. No respiratory distress. He has no wheezes. He has no rales. He exhibits no tenderness.  Abdominal: Soft. Bowel sounds are normal. He exhibits no distension and no mass. There is no tenderness. There is no rebound and no guarding.  Musculoskeletal: Normal range of motion. He exhibits no edema or tenderness.  Lymphadenopathy:    He has no cervical adenopathy.  Neurological: He is alert and oriented to person, place, and time. He has normal reflexes. No cranial nerve deficit. He exhibits normal muscle tone. He displays a negative Romberg sign. Coordination and gait normal.  No meningeal signs  Skin: Skin is warm and dry. No rash noted.  Psychiatric: He has a normal mood and affect. His behavior is normal. Judgment and thought content normal.    Lab Results  Component Value Date   WBC 7.8 02/11/2014   HGB 14.9 02/11/2014   HCT 44.6 02/11/2014   PLT 300.0 02/11/2014   GLUCOSE 96 02/11/2014   CHOL 176 01/04/2014   TRIG 195.0* 01/04/2014   HDL 39.10 01/04/2014   LDLDIRECT 85.7 04/10/2012   LDLCALC 98 01/04/2014   ALT 24 02/11/2014   AST 33 02/11/2014   NA 137 02/11/2014   K 4.1 02/11/2014   CL 103 02/11/2014   CREATININE 0.9 02/11/2014   BUN 23 02/11/2014   CO2 27 02/11/2014   TSH 2.15 01/04/2014   PSA 0.27 04/10/2012    I personally provided Breo inhaler use teaching. After the teaching patient was able to  demonstrate it's use effectively. All questions were answered      Assessment & Plan:

## 2014-03-18 NOTE — Assessment & Plan Note (Signed)
GERD, "esophagus on fire" - Protonix helped a lot. He saw ENT (Dr Richardson Landry)

## 2014-03-18 NOTE — Progress Notes (Signed)
Pre visit review using our clinic review tool, if applicable. No additional management support is needed unless otherwise documented below in the visit note. 

## 2014-03-18 NOTE — Assessment & Plan Note (Signed)
Breo Treat GERD

## 2014-03-19 ENCOUNTER — Telehealth: Payer: Self-pay | Admitting: Internal Medicine

## 2014-03-19 NOTE — Telephone Encounter (Signed)
emmi mailed  °

## 2014-03-25 ENCOUNTER — Encounter: Payer: Self-pay | Admitting: Internal Medicine

## 2014-03-25 ENCOUNTER — Ambulatory Visit (INDEPENDENT_AMBULATORY_CARE_PROVIDER_SITE_OTHER): Payer: 59 | Admitting: Internal Medicine

## 2014-03-25 VITALS — BP 126/80 | HR 80 | Temp 97.6°F | Ht 73.0 in | Wt 204.0 lb

## 2014-03-25 DIAGNOSIS — R059 Cough, unspecified: Secondary | ICD-10-CM

## 2014-03-25 DIAGNOSIS — R05 Cough: Secondary | ICD-10-CM

## 2014-03-25 MED ORDER — ALBUTEROL SULFATE HFA 108 (90 BASE) MCG/ACT IN AERS: 1.0000 | INHALATION_SPRAY | RESPIRATORY_TRACT | Status: DC | PRN

## 2014-03-25 MED ORDER — BECLOMETHASONE DIPROPIONATE 80 MCG/ACT IN AERS: 1.0000 | INHALATION_SPRAY | Freq: Two times a day (BID) | RESPIRATORY_TRACT | Status: DC

## 2014-03-25 NOTE — Patient Instructions (Addendum)
We will see you back in 4-5 weeks. Please call our [facility] office @ [phone] to schedule PFT and 6 minute walk testing here at [facility] office before you follow-up appointment. Please schedule appointment with Dr. Henrene Pastor for evaluation acid reflux possible endoscopy. We will get you scheduled for a chest xray. Stop Breo! Start Qvar inhaler 1 puff twice daily.  Remember to gargle after each use! We will also sent Albuterol inhaler to your pharmacy.

## 2014-03-25 NOTE — Assessment & Plan Note (Signed)
The standardized cough guidelines published in Chest by Lissa Morales in 2006 are still the best available and consist of a multiple step process (up to 12!) , not a single office visit,  and are intended  to address this problem logically,  with an alogrithm dependent on response to empiric treatment at  each progressive step  to determine a specific diagnosis with  minimal addtional testing needed. Therefore if adherence is an issue or can't be accurately verified,  it's very unlikely the standard evaluation and treatment will be successful here.    Furthermore, response to therapy (other than acute cough suppression, which should only be used short term with avoidance of narcotic containing cough syrups if possible), can be a gradual process for which the patient may perceive immediate benefit.  Unlike going to an eye doctor where the best perscription is almost always the first one and is immediately effective, this is almost never the case in the management of chronic cough syndromes. Therefore the patient needs to commit up front to consistently adhere to recommendations  for up to 6 weeks of therapy directed at the likely underlying problem(s) before the response can be reasonably evaluated.  I believe it is tender the patient cough is multifactorial: silent GERD, Asthma, COPD, possible Esophageal spasms, dysphagia, Allergies  Optimization of his current therapy, current allergic regiment and workup of obstructive versus pulmonary disease will be useful in diagnosis. His recent x-ray and December 2015 is normal with no acute processes, making asthma, upper airway cough syndrome, GERD high in the differential. Patient does have a history of childhood asthma, was started on combination ICS/LA BA by PMD, only over the past 3 weeks with little improvement. Optimal initial therapy for asthma would be inhale corticosteroid, however in the event of severe symptoms that are not controlled with ICS  (wheezing, shortness of breath, cough, continued use of rescue inhaler) then combination therapy should be warranted. Will plan to initiate treatment with ICS, continue allergy control with Flonase, and optimize GERD treatment and followup with GI for possible evaluation of upper endoscopy. Patient does have a history of posterior neck surgery in the past, and states with eating food quickly he does develop a choking sensation at times-if initial workup is inconclusive will consider dysphagia workup.  Plan - stop breo, start qvar, cont with flonase - PFTs, 6MWT, CT chest with contrast - refer to GI evaluation of GERD possible upper endoscopy
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
## 2014-03-25 NOTE — Addendum Note (Signed)
Addended by: Raymondo Band D on: 03/25/2014 12:51 PM   Modules accepted: Orders

## 2014-04-01 ENCOUNTER — Ambulatory Visit: Payer: Self-pay | Admitting: Internal Medicine

## 2014-04-01 LAB — PULMONARY FUNCTION TEST

## 2014-04-05 ENCOUNTER — Telehealth: Payer: Self-pay | Admitting: Internal Medicine

## 2014-04-05 NOTE — Telephone Encounter (Signed)
PFTS - Assessment/Impression: No evidence of Obstructive Or Restrictive Lung disease, Clinical Correlation Advised.  Plan: follow up with Pulm as needed.  CT Chest: Mild left upper lobe scarring. No evidence of neoplasm or other active disease within the thorax.  I will discuss the details of each test at his follow up visit.   - Dr. Stevenson Clinch

## 2014-04-05 NOTE — Telephone Encounter (Signed)
lmomtcb x1 

## 2014-04-05 NOTE — Telephone Encounter (Signed)
Spoke with pt - He had a PFT and CT Chest done on 04/01/14 at the [place] [facility] and is requesting requests.  Dr. Stevenson Clinch, please advise.  Thank you.

## 2014-04-05 NOTE — Telephone Encounter (Signed)
Pt returned call [phone]

## 2014-04-06 NOTE — Telephone Encounter (Signed)
Pt aware of results.  Scheduled rov as this was not done.  Nothing further needed.

## 2014-04-07 ENCOUNTER — Ambulatory Visit (INDEPENDENT_AMBULATORY_CARE_PROVIDER_SITE_OTHER): Payer: 59 | Admitting: Internal Medicine

## 2014-04-07 ENCOUNTER — Encounter: Payer: Self-pay | Admitting: Internal Medicine

## 2014-04-07 VITALS — BP 138/66 | HR 70 | Ht 73.0 in | Wt 206.0 lb

## 2014-04-07 DIAGNOSIS — R053 Chronic cough: Secondary | ICD-10-CM

## 2014-04-07 DIAGNOSIS — R05 Cough: Secondary | ICD-10-CM

## 2014-04-07 DIAGNOSIS — K219 Gastro-esophageal reflux disease without esophagitis: Secondary | ICD-10-CM

## 2014-04-07 DIAGNOSIS — R1310 Dysphagia, unspecified: Secondary | ICD-10-CM

## 2014-04-07 DIAGNOSIS — Z86010 Personal history of colonic polyps: Secondary | ICD-10-CM
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
## 2014-04-07 NOTE — Patient Instructions (Signed)

## 2014-04-08 ENCOUNTER — Other Ambulatory Visit: Payer: Self-pay | Admitting: *Deleted

## 2014-04-08 DIAGNOSIS — R05 Cough: Secondary | ICD-10-CM

## 2014-04-08 DIAGNOSIS — R059 Cough, unspecified: Secondary | ICD-10-CM

## 2014-04-12 ENCOUNTER — Encounter: Payer: Self-pay | Admitting: Internal Medicine

## 2014-04-12 ENCOUNTER — Ambulatory Visit (AMBULATORY_SURGERY_CENTER): Payer: 59 | Admitting: Internal Medicine

## 2014-04-12 VITALS — BP 127/74 | HR 74 | Temp 96.5°F | Resp 18 | Ht 73.0 in | Wt 206.0 lb

## 2014-04-12 DIAGNOSIS — R1310 Dysphagia, unspecified: Secondary | ICD-10-CM

## 2014-04-12 DIAGNOSIS — K219 Gastro-esophageal reflux disease without esophagitis: Secondary | ICD-10-CM

## 2014-04-12 MED ORDER — SODIUM CHLORIDE 0.9 % IV SOLN: 500.0000 mL | INTRAVENOUS | Status: DC

## 2014-04-12 NOTE — Op Note (Signed)
[place]  [address]. [place], [id number]   ENDOSCOPY PROCEDURE REPORT  PATIENT: Teagen, Mcleary  MR#: [id number] BIRTHDATE: 1945/06/06 , 68  yrs. old GENDER: male ENDOSCOPIST: Eustace Quail, MD REFERRED BY:  Creig Hines, M.D. PROCEDURE DATE:  04/12/2014 PROCEDURE:  EGD, diagnostic ASA CLASS:     Class II INDICATIONS:  history of esophageal reflux and dysphagia.  also, chronic cough MEDICATIONS: Monitored anesthesia care and Propofol 150 mg IV TOPICAL ANESTHETIC: none  DESCRIPTION OF PROCEDURE: After the risks benefits and alternatives of the procedure were thoroughly explained, informed consent was obtained.  The LB [id number] endoscope was introduced through the mouth and advanced to the second portion of the duodenum , Without limitations.  The instrument was slowly withdrawn as the mucosa was fully examined.    EXAM: The esophagus and gastroesophageal junction were completely normal in appearance.  The stomach was entered and closely examined.The antrum, angularis, and lesser curvature were well visualized, including a retroflexed view of the cardia and fundus. The stomach wall was normally distensable.  The scope passed easily through the pylorus into the duodenum.  Retroflexed views revealed no abnormalities.     The scope was then withdrawn from the patient and the procedure completed.  COMPLICATIONS: There were no immediate complications.  ENDOSCOPIC IMPRESSION: 1. Normal EGD 2. GERD 3. Chronic cough of uncertain cause  RECOMMENDATIONS: 1.  Anti-reflux regimen to be followed 2.  Continue PPI  twice daily for an additional 2 months to see if this helps with cough. If not, cough unlikely due to reflux and what would be recommended is the lowest dose of PPI to control classic reflux symptoms, such as heartburn  REPEAT EXAM:  eSigned:  Eustace Quail, MD 04/12/2014 9:17 AM    WV:PXTG V.  [name], MD and The  Patient

## 2014-04-12 NOTE — Progress Notes (Signed)
Report to PACU, RN, vss, BBS= Clear.  

## 2014-04-12 NOTE — Patient Instructions (Signed)

## 2014-04-13 ENCOUNTER — Telehealth: Payer: Self-pay

## 2014-04-13 NOTE — Telephone Encounter (Signed)
  Follow up Call-  Call back number 04/12/2014 04/02/2013  Post procedure Call Back phone  # [phone]  Permission to leave phone message Yes Yes     Patient questions:  Do you have a fever, pain , or abdominal swelling? No. Pain Score  0 *  Have you tolerated food without any problems? Yes.    Have you been able to return to your normal activities? Yes.    Do you have any questions about your discharge instructions: Diet   No. Medications  No. Follow up visit  No.  Do you have questions or concerns about your Care? No.  Actions: * If pain score is 4 or above: No action needed, pain <4.

## 2014-05-03 ENCOUNTER — Ambulatory Visit: Payer: 59 | Admitting: Internal Medicine

## 2014-05-04 ENCOUNTER — Ambulatory Visit: Payer: 59 | Admitting: Internal Medicine

## 2014-06-09 ENCOUNTER — Ambulatory Visit: Payer: 59 | Admitting: Internal Medicine

## 2014-06-15 ENCOUNTER — Ambulatory Visit (INDEPENDENT_AMBULATORY_CARE_PROVIDER_SITE_OTHER): Payer: 59 | Admitting: Internal Medicine

## 2014-06-15 ENCOUNTER — Encounter: Payer: Self-pay | Admitting: Internal Medicine

## 2014-06-15 VITALS — BP 150/90 | HR 68 | Wt 207.0 lb

## 2014-06-15 DIAGNOSIS — R059 Cough, unspecified: Secondary | ICD-10-CM

## 2014-06-15 DIAGNOSIS — K2100 Gastro-esophageal reflux disease with esophagitis, without bleeding: Secondary | ICD-10-CM

## 2014-06-15 DIAGNOSIS — R05 Cough: Secondary | ICD-10-CM | POA: Diagnosis not present

## 2014-06-15 MED ORDER — TRAMADOL HCL 50 MG PO TABS: 50.0000 mg | ORAL_TABLET | Freq: Four times a day (QID) | ORAL | Status: DC

## 2014-06-15 MED ORDER — LORATADINE 10 MG PO TABS: 10.0000 mg | ORAL_TABLET | Freq: Every day | ORAL | Status: DC

## 2014-06-15 NOTE — Progress Notes (Signed)
Subjective:    Cough This is a chronic problem. The current episode started more than 1 month ago (since Thanksgiving). The problem has been unchanged. The problem occurs constantly. The cough is productive of sputum. Pertinent negatives include no chest pain, rash, sore throat or wheezing. The symptoms are aggravated by dust, other and fumes (house remodeling). Treatments tried: PPI. The treatment provided no relief. His past medical history is significant for COPD. There is no history of asthma.  CT was OK. Breo did not help. Cough syr did help  F/u GERD, "esophagus on fire" - Protonix helped a lot: coughing spells x 2 month; can't swallow when it is flaring... Stress w/son. He saw ENT (Dr Richardson Landry). Remote h/o asthma.Marland KitchenMarland KitchenC/o wheezind  The patient has been doing well overall without major physical or psychological issues going on lately. BP is OK at home.  The patient presents for a follow-up of  chronic hypertension, chronic dyslipidemia, BPH, kidney stones, depression, LBP - off pain meds x6 weeks  Wt Readings from Last 3 Encounters:  06/15/14 207 lb (93.895 kg)  04/12/14 206 lb (93.441 kg)  04/07/14 206 lb (93.441 kg)   BP Readings from Last 3 Encounters:  06/15/14 150/90  04/12/14 127/74  04/07/14 138/66      Review of Systems  Constitutional: Negative for appetite change, fatigue and unexpected weight change.  HENT: Negative for congestion, nosebleeds, sneezing, sore throat and trouble swallowing.   Eyes: Negative for itching and visual disturbance.  Respiratory: Positive for cough. Negative for wheezing.   Cardiovascular: Negative for chest pain, palpitations and leg swelling.  Gastrointestinal: Negative for nausea, diarrhea, blood in stool and abdominal distention.  Genitourinary: Negative for frequency and hematuria.  Musculoskeletal: Negative for back pain, joint swelling, gait problem and neck pain.  Skin: Negative for rash.  Neurological: Negative for dizziness,  tremors, speech difficulty and weakness.  Psychiatric/Behavioral: Negative for suicidal ideas, sleep disturbance, dysphoric mood and agitation. The patient is not nervous/anxious.        Objective:   Physical Exam  Constitutional: He is oriented to person, place, and time. He appears well-developed. No distress.  NAD  HENT:  Mouth/Throat: Oropharynx is clear and moist.  Eyes: Conjunctivae are normal. Pupils are equal, round, and reactive to light.  Neck: Normal range of motion. No JVD present. No thyromegaly present.  Cardiovascular: Normal rate, regular rhythm, normal heart sounds and intact distal pulses.  Exam reveals no gallop and no friction rub.   No murmur heard. Pulmonary/Chest: Effort normal and breath sounds normal. No respiratory distress. He has no wheezes. He has no rales. He exhibits no tenderness.  Abdominal: Soft. Bowel sounds are normal. He exhibits no distension and no mass. There is no tenderness. There is no rebound and no guarding.  Musculoskeletal: Normal range of motion. He exhibits no edema or tenderness.  Lymphadenopathy:    He has no cervical adenopathy.  Neurological: He is alert and oriented to person, place, and time. He has normal reflexes. No cranial nerve deficit. He exhibits normal muscle tone. He displays a negative Romberg sign. Coordination and gait normal.  Skin: Skin is warm and dry. No rash noted.  Psychiatric: He has a normal mood and affect. His behavior is normal. Judgment and thought content normal.    Lab Results  Component Value Date   WBC 7.8 02/11/2014   HGB 14.9 02/11/2014   HCT 44.6 02/11/2014   PLT 300.0 02/11/2014   GLUCOSE 96 02/11/2014   CHOL 176 01/04/2014  TRIG 195.0* 01/04/2014   HDL 39.10 01/04/2014   LDLDIRECT 85.7 04/10/2012   LDLCALC 98 01/04/2014   ALT 24 02/11/2014   AST 33 02/11/2014   NA 137 02/11/2014   K 4.1 02/11/2014   CL 103 02/11/2014   CREATININE 0.9 02/11/2014   BUN 23 02/11/2014   CO2 27 02/11/2014    TSH 2.15 01/04/2014   PSA 0.27 04/10/2012    I personally provided Breo inhaler use teaching. After the teaching patient was able to demonstrate it's use effectively. All questions were answered      Assessment & Plan:

## 2014-06-15 NOTE — Assessment & Plan Note (Signed)
3/15 Cyclical cough Options to treat discussed Tramadol prn Treat GERD

## 2014-06-15 NOTE — Progress Notes (Signed)
Pre visit review using our clinic review tool, if applicable. No additional management support is needed unless otherwise documented below in the visit note. 

## 2014-06-15 NOTE — Assessment & Plan Note (Signed)
Protonix bid 

## 2014-06-17 ENCOUNTER — Ambulatory Visit: Payer: 59 | Admitting: Internal Medicine

## 2014-07-02 NOTE — Op Note (Signed)
PATIENT NAME:  Paul [name], Paul [name] MR#:  [id number] DATE OF BIRTH:  Jul 27, 1945  DATE OF PROCEDURE:  08/18/2012  PREOPERATIVE DIAGNOSIS:  Cataract, right eye.   POSTOPERATIVE DIAGNOSIS:  Cataract, right eye.  PROCEDURE PERFORMED:  Extracapsular cataract extraction using phacoemulsification with placement of an Alcon SN6CWS, 19.5-diopter posterior chamber lens, serial [id number].  SURGEON:  Loura Back. Lion Fernandez, MD  ASSISTANT:  None.  ANESTHESIA:  4% lidocaine and 0.75% Marcaine in a 50/50 mixture with 10 units/mL of Hylenex added, given as a peribulbar.   ANESTHESIOLOGIST:  Dr. Benjamine Mola  COMPLICATIONS:  None.  ESTIMATED BLOOD LOSS:  Less than 1 ml.  DESCRIPTION OF PROCEDURE:  The patient was brought to the operating room and given a peribulbar block.  The patient was then prepped and draped in the usual fashion.  The vertical rectus muscles were imbricated using 5-0 silk sutures.  These sutures were then clamped to the sterile drapes as bridle sutures.  A limbal peritomy was performed extending two clock hours and hemostasis was obtained with cautery.  A partial thickness scleral groove was made at the surgical limbus and dissected anteriorly in a lamellar dissection using an Alcon crescent knife.  The anterior chamber was entered superonasally with a Superblade and through the lamellar dissection with a 2.6 mm keratome.  DisCoVisc was used to replace the aqueous and a continuous tear capsulorrhexis was carried out.  Hydrodissection and hydrodelineation were carried out with balanced salt and a 27 gauge canula.  The nucleus was rotated to confirm the effectiveness of the hydrodissection.  Phacoemulsification was carried out using a divide-and-conquer technique.  Total ultrasound time was  1 minute and 25 seconds with an average power of 23.4 percent and CDE of 32.45.  Irrigation/aspiration was used to remove the residual cortex.  DisCoVisc was used to inflate the capsule and the internal  incision was enlarged to 3 mm with the crescent knife.  The intraocular lens was folded and inserted into the capsular bag using the AcrySert delivery system. Irrigation/aspiration was used to remove the residual DisCoVisc.  Miostat was injected into the anterior chamber through the paracentesis track to inflate the anterior chamber and induce miosis.  A tenth of a milliliter of cefuroxime was placed into the anterior chamber via the paracentesis tract. The wound was checked for leaks and none were found. The conjunctiva was closed with cautery and the bridle sutures were removed.  Two drops of 0.3% Vigamox were placed on the eye.   An eye shield was placed on the eye.  The patient was discharged to the recovery room in good condition. ____________________________ Loura Back Elzy Tomasello, MD sad:sb D: 08/18/2012 13:55:07 ET T: 08/18/2012 14:24:00 ET JOB#: [id number]  cc: Remo Lipps A. Khalin Royce, MD, <Dictator> Martie Lee MD ELECTRONICALLY SIGNED 08/25/2012 13:32

## 2014-07-03 NOTE — Op Note (Signed)
PATIENT NAME:  Paul [name], Paul [name] MR#:  [id number] DATE OF BIRTH:  1945-11-25  DATE OF PROCEDURE:  06/15/2013  PREOPERATIVE DIAGNOSIS:  Cataract, left eye.    POSTOPERATIVE DIAGNOSIS:  Cataract, left eye.  PROCEDURE PERFORMED:  Extracapsular cataract extraction using phacoemulsification with placement of an Alcon SN6CWS, 19.0-diopter posterior chamber lens, serial # [id number].  SURGEON:  Loura Back. Maritssa Haughton, MD  ASSISTANT:  None.  ANESTHESIA:  4% lidocaine and 0.75% Marcaine in a 50/50 mixture of 10 units/mL of Hylenex added, given as a peribulbar.  ANESTHESIOLOGIST:  Dr. Benjamine Mola.  COMPLICATIONS:  None.  ESTIMATED BLOOD LOSS:  Less than 1 ml.  DESCRIPTION OF PROCEDURE:  The patient was brought to the operating room and given a peribulbar block.  The patient was then prepped and draped in the usual fashion.  The vertical rectus muscles were imbricated using 5-0 silk sutures.  These sutures were then clamped to the sterile drapes as bridle sutures.  A limbal peritomy was performed extending two clock hours and hemostasis was obtained with cautery.  A partial thickness scleral groove was made at the surgical limbus and dissected anteriorly in a lamellar dissection using an Alcon crescent knife.  The anterior chamber was entered supero-temporally with a Superblade and through the lamellar dissection with a 2.6 mm keratome.  DisCoVisc was used to replace the aqueous and a continuous tear capsulorrhexis was carried out.  Hydrodissection and hydrodelineation were carried out with balanced salt and a 27 gauge canula.  The nucleus was rotated to confirm the effectiveness of the hydrodissection.  Phacoemulsification was carried out using a divide-and-conquer technique.  Total ultrasound time was 1 minute  and 14.6 seconds with an average power of 25.3%.  CDE of 29.66.  Irrigation/aspiration was used to remove the residual cortex.  DisCoVisc was used to inflate the capsule and the internal incision  was enlarged to 3 mm with the crescent knife.  The intraocular lens was folded and inserted into the capsular bag using the AcrySert Delivery System.  Irrigation/aspiration was used to remove the residual DisCoVisc.  Miostat was injected into the anterior chamber through the paracentesis track to inflate the anterior chamber and induce miosis.  0.1 mL of cefuroxime containing 1 mg of drug was injected via the paracentesis tract.  The wound was checked for leaks and none were found. The conjunctiva was closed with cautery and the bridle sutures were removed.  Two drops of 0.3% Vigamox were placed on the eye.   An eye shield was placed on the eye.  The patient was discharged to the recovery room in good condition.    ____________________________ Loura Back Ranier Coach, MD sad:dmm D: 06/15/2013 12:10:52 ET T: 06/15/2013 12:30:51 ET JOB#: [id number]  cc: Remo Lipps A. Maleeyah Mccaughey, MD, <Dictator> Martie Lee MD ELECTRONICALLY SIGNED 06/22/2013 12:23

## 2014-07-22 ENCOUNTER — Telehealth: Payer: Self-pay | Admitting: Internal Medicine

## 2014-07-22 NOTE — Telephone Encounter (Signed)
Pt called said that the Tramadol is making him feel weird and wants to know is there anything else that he can try?      Best number [phone]

## 2014-07-22 NOTE — Telephone Encounter (Signed)
i would not continue narcotic tx as this will lead to tolerance and/or dependence  OK to try OTC delsym

## 2014-07-22 NOTE — Telephone Encounter (Signed)
He was given Tramadol for his chronic cough and at  first it worked. After a few weeks it wasn't as effective so he tried 2 Tramadol at once, but 2 made him "loopy".  At one point, he didn't want narcotics, but now he just wants relieve from the cough. He is aware PCP is out of office until 07/26/14. Please advise.

## 2014-07-22 NOTE — Telephone Encounter (Signed)
Pt informed of MD response below.  Pt will try the Delsym.

## 2014-07-27 ENCOUNTER — Ambulatory Visit: Payer: 59 | Admitting: Internal Medicine

## 2014-08-13 ENCOUNTER — Encounter: Payer: Self-pay | Admitting: Internal Medicine

## 2014-08-13 ENCOUNTER — Ambulatory Visit (INDEPENDENT_AMBULATORY_CARE_PROVIDER_SITE_OTHER): Payer: 59 | Admitting: Internal Medicine

## 2014-08-13 VITALS — BP 140/80 | HR 66 | Wt 203.0 lb

## 2014-08-13 DIAGNOSIS — R059 Cough, unspecified: Secondary | ICD-10-CM

## 2014-08-13 DIAGNOSIS — R05 Cough: Secondary | ICD-10-CM

## 2014-08-13 MED ORDER — TRAMADOL HCL 50 MG PO TABS: 50.0000 mg | ORAL_TABLET | Freq: Four times a day (QID) | ORAL | Status: DC

## 2014-08-13 MED ORDER — BENZONATATE 200 MG PO CAPS: 200.0000 mg | ORAL_CAPSULE | Freq: Two times a day (BID) | ORAL | Status: DC | PRN

## 2014-08-13 NOTE — Progress Notes (Signed)
Pre visit review using our clinic review tool, if applicable. No additional management support is needed unless otherwise documented below in the visit note. 

## 2014-08-13 NOTE — Assessment & Plan Note (Signed)
30% better on Protonix Add Claritin Try Tessalon

## 2014-08-13 NOTE — Progress Notes (Signed)
Subjective:    Cough This is a chronic problem. The current episode started more than 1 month ago (since Thanksgiving). The problem has been unchanged. The problem occurs constantly. The cough is productive of sputum. Pertinent negatives include no chest pain, rash, sore throat or wheezing. The symptoms are aggravated by dust, other and fumes (house remodeling). Treatments tried: PPI. The treatment provided no relief. His past medical history is significant for COPD. There is no history of asthma.  CT was OK. Breo did not help. Cough is 30 % better on Protonix  F/u GERD, "esophagus on fire" - Protonix helped a lot: coughing spells x 2 month; can't swallow when it is flaring... Stress w/son. He saw ENT (Dr Richardson Landry). Remote h/o asthma.Marland KitchenMarland KitchenF/u wheezing - resolved  The patient has been doing well overall without major physical or psychological issues going on lately. BP is OK at home.  The patient presents for a follow-up of  chronic hypertension, chronic dyslipidemia, BPH, kidney stones, depression, LBP - off pain meds x6 weeks  Wt Readings from Last 3 Encounters:  08/13/14 203 lb (92.08 kg)  06/15/14 207 lb (93.895 kg)  04/12/14 206 lb (93.441 kg)   BP Readings from Last 3 Encounters:  08/13/14 140/80  06/15/14 150/90  04/12/14 127/74      Review of Systems  Constitutional: Negative for appetite change, fatigue and unexpected weight change.  HENT: Negative for congestion, nosebleeds, sneezing, sore throat and trouble swallowing.   Eyes: Negative for itching and visual disturbance.  Respiratory: Positive for cough. Negative for wheezing.   Cardiovascular: Negative for chest pain, palpitations and leg swelling.  Gastrointestinal: Negative for nausea, diarrhea, blood in stool and abdominal distention.  Genitourinary: Negative for frequency and hematuria.  Musculoskeletal: Negative for back pain, joint swelling, gait problem and neck pain.  Skin: Negative for rash.  Neurological:  Negative for dizziness, tremors, speech difficulty and weakness.  Psychiatric/Behavioral: Negative for suicidal ideas, sleep disturbance, dysphoric mood and agitation. The patient is not nervous/anxious.        Objective:   Physical Exam  Constitutional: He is oriented to person, place, and time. He appears well-developed. No distress.  NAD  HENT:  Mouth/Throat: Oropharynx is clear and moist.  Eyes: Conjunctivae are normal. Pupils are equal, round, and reactive to light.  Neck: Normal range of motion. No JVD present. No thyromegaly present.  Cardiovascular: Normal rate, regular rhythm, normal heart sounds and intact distal pulses.  Exam reveals no gallop and no friction rub.   No murmur heard. Pulmonary/Chest: Effort normal and breath sounds normal. No respiratory distress. He has no wheezes. He has no rales. He exhibits no tenderness.  Abdominal: Soft. Bowel sounds are normal. He exhibits no distension and no mass. There is no tenderness. There is no rebound and no guarding.  Musculoskeletal: Normal range of motion. He exhibits no edema or tenderness.  Lymphadenopathy:    He has no cervical adenopathy.  Neurological: He is alert and oriented to person, place, and time. He has normal reflexes. No cranial nerve deficit. He exhibits normal muscle tone. He displays a negative Romberg sign. Coordination and gait normal.  Skin: Skin is warm and dry. No rash noted.  Psychiatric: He has a normal mood and affect. His behavior is normal. Judgment and thought content normal.    Lab Results  Component Value Date   WBC 7.8 02/11/2014   HGB 14.9 02/11/2014   HCT 44.6 02/11/2014   PLT 300.0 02/11/2014   GLUCOSE 96 02/11/2014  CHOL 176 01/04/2014   TRIG 195.0* 01/04/2014   HDL 39.10 01/04/2014   LDLDIRECT 85.7 04/10/2012   LDLCALC 98 01/04/2014   ALT 24 02/11/2014   AST 33 02/11/2014   NA 137 02/11/2014   K 4.1 02/11/2014   CL 103 02/11/2014   CREATININE 0.9 02/11/2014   BUN 23  02/11/2014   CO2 27 02/11/2014   TSH 2.15 01/04/2014   PSA 0.27 04/10/2012         Assessment & Plan:

## 2014-09-08 ENCOUNTER — Other Ambulatory Visit: Payer: Self-pay | Admitting: Internal Medicine

## 2014-10-01 ENCOUNTER — Other Ambulatory Visit: Payer: Self-pay | Admitting: Internal Medicine

## 2014-10-07 NOTE — Telephone Encounter (Signed)
Called ambien rx to [facility] per dr plotnikov's note

## 2014-10-07 NOTE — Telephone Encounter (Signed)
Rx called in 

## 2014-11-05 ENCOUNTER — Other Ambulatory Visit: Payer: Self-pay | Admitting: Internal Medicine

## 2014-11-12 ENCOUNTER — Ambulatory Visit: Payer: 59 | Admitting: Internal Medicine

## 2015-01-04 ENCOUNTER — Ambulatory Visit (INDEPENDENT_AMBULATORY_CARE_PROVIDER_SITE_OTHER): Payer: 59 | Admitting: Internal Medicine

## 2015-01-04 ENCOUNTER — Encounter: Payer: Self-pay | Admitting: Internal Medicine

## 2015-01-04 VITALS — BP 150/90 | HR 69 | Wt 206.0 lb

## 2015-01-04 DIAGNOSIS — R05 Cough: Secondary | ICD-10-CM

## 2015-01-04 DIAGNOSIS — J019 Acute sinusitis, unspecified: Secondary | ICD-10-CM

## 2015-01-04 DIAGNOSIS — I10 Essential (primary) hypertension: Secondary | ICD-10-CM

## 2015-01-04 DIAGNOSIS — F32A Depression, unspecified: Secondary | ICD-10-CM

## 2015-01-04 DIAGNOSIS — F329 Major depressive disorder, single episode, unspecified: Secondary | ICD-10-CM

## 2015-01-04 DIAGNOSIS — Z23 Encounter for immunization: Secondary | ICD-10-CM

## 2015-01-04 DIAGNOSIS — M545 Low back pain: Secondary | ICD-10-CM

## 2015-01-04 DIAGNOSIS — R059 Cough, unspecified: Secondary | ICD-10-CM

## 2015-01-04 DIAGNOSIS — G8929 Other chronic pain: Secondary | ICD-10-CM

## 2015-01-04 MED ORDER — ACETAMINOPHEN-CODEINE #2 300-15 MG PO TABS: 1.0000 | ORAL_TABLET | Freq: Three times a day (TID) | ORAL | Status: DC | PRN

## 2015-01-04 MED ORDER — ACETAMINOPHEN-CODEINE #4 300-60 MG PO TABS: 1.0000 | ORAL_TABLET | Freq: Four times a day (QID) | ORAL | Status: DC | PRN

## 2015-01-04 NOTE — Progress Notes (Signed)
Subjective:  Patient ID: Paul [name]., male    DOB: [birth year]  Age: 68 y.o. MRN: [id number]  CC: No chief complaint on file.   HPI Paul [name] [employer]. presents for allergies, cough, GERD, LBP. Pt saw an allergist: ?cyclical cough. Tramadol was prescribed...  Outpatient Prescriptions Prior to Visit  Medication Sig Dispense Refill  . amLODipine-olmesartan ([name]) 5-40 MG per tablet Take 1 tablet by mouth daily. 90 tablet 3  . aspirin 81 MG EC tablet Take 81 mg by mouth daily.      Marland Kitchen atorvastatin (LIPITOR) 10 MG tablet take 1 tablet by mouth once daily 60 tablet 5  . buPROPion (WELLBUTRIN XL) 300 MG 24 hr tablet take 1 tablet by mouth once daily 90 tablet 2  . Cholecalciferol 1000 UNITS tablet Take 1,000 Units by mouth daily.      . finasteride (PROSCAR) 5 MG tablet take 1 tablet by mouth once daily 30 tablet 11  . loratadine (CLARITIN) 10 MG tablet Take 1 tablet (10 mg total) by mouth daily. 100 tablet 3  . pantoprazole (PROTONIX) 40 MG tablet Take 1 tablet (40 mg total) by mouth 2 (two) times daily. 60 tablet 11  . traMADol (ULTRAM) 50 MG tablet Take 1 tablet (50 mg total) by mouth 4 (four) times daily. 120 tablet 2  . benzonatate (TESSALON) 200 MG capsule Take 1 capsule (200 mg total) by mouth 2 (two) times daily as needed for cough. (Patient not taking: Reported on 01/04/2015) 60 capsule 1  . fluticasone (FLONASE) 50 MCG/ACT nasal spray Place 1 spray into the nose daily.    Marland Kitchen zolpidem (AMBIEN) 10 MG tablet take 1/2 to 1 tablet by mouth at bedtime (Patient not taking: Reported on 01/04/2015) 30 tablet 3   No facility-administered medications prior to visit.    ROS Review of Systems  Constitutional: Negative for appetite change, fatigue and unexpected weight change.  HENT: Negative for congestion, nosebleeds, sneezing, sore throat and trouble swallowing.   Eyes: Negative for itching and visual disturbance.  Respiratory: Positive for cough.   Cardiovascular: Negative for  chest pain, palpitations and leg swelling.  Gastrointestinal: Negative for nausea, diarrhea, blood in stool and abdominal distention.  Genitourinary: Negative for frequency and hematuria.  Musculoskeletal: Positive for back pain and arthralgias. Negative for joint swelling, gait problem and neck pain.  Skin: Negative for rash.  Neurological: Negative for dizziness, tremors, speech difficulty and weakness.  Psychiatric/Behavioral: Negative for sleep disturbance, dysphoric mood and agitation. The patient is not nervous/anxious.     Objective:  BP 150/90 mmHg  Pulse 69  Wt 206 lb (93.441 kg)  SpO2 95%  BP Readings from Last 3 Encounters:  01/04/15 150/90  08/13/14 140/80  06/15/14 150/90    Wt Readings from Last 3 Encounters:  01/04/15 206 lb (93.441 kg)  08/13/14 203 lb (92.08 kg)  06/15/14 207 lb (93.895 kg)    Physical Exam  Constitutional: He is oriented to person, place, and time. He appears well-developed. No distress.  NAD  HENT:  Mouth/Throat: Oropharynx is clear and moist.  Eyes: Conjunctivae are normal. Pupils are equal, round, and reactive to light.  Neck: Normal range of motion. No JVD present. No thyromegaly present.  Cardiovascular: Normal rate, regular rhythm, normal heart sounds and intact distal pulses.  Exam reveals no gallop and no friction rub.   No murmur heard. Pulmonary/Chest: Effort normal and breath sounds normal. No respiratory distress. He has no wheezes. He has no rales. He exhibits no  tenderness.  Abdominal: Soft. Bowel sounds are normal. He exhibits no distension and no mass. There is no tenderness. There is no rebound and no guarding.  Musculoskeletal: Normal range of motion. He exhibits no edema or tenderness.  Lymphadenopathy:    He has no cervical adenopathy.  Neurological: He is alert and oriented to person, place, and time. He has normal reflexes. No cranial nerve deficit. He exhibits normal muscle tone. He displays a negative Romberg sign.  Coordination and gait normal.  Skin: Skin is warm and dry. No rash noted.  Psychiatric: He has a normal mood and affect. His behavior is normal. Judgment and thought content normal.    Lab Results  Component Value Date   WBC 7.8 02/11/2014   HGB 14.9 02/11/2014   HCT 44.6 02/11/2014   PLT 300.0 02/11/2014   GLUCOSE 96 02/11/2014   CHOL 176 01/04/2014   TRIG 195.0* 01/04/2014   HDL 39.10 01/04/2014   LDLDIRECT 85.7 04/10/2012   LDLCALC 98 01/04/2014   ALT 24 02/11/2014   AST 33 02/11/2014   NA 137 02/11/2014   K 4.1 02/11/2014   CL 103 02/11/2014   CREATININE 0.9 02/11/2014   BUN 23 02/11/2014   CO2 27 02/11/2014   TSH 2.15 01/04/2014   PSA 0.27 04/10/2012    No results found.  Assessment & Plan:   There are no diagnoses linked to this encounter. I am having [name] maintain his aspirin, Cholecalciferol, fluticasone, atorvastatin, amLODipine-olmesartan, pantoprazole, loratadine, traMADol, benzonatate, finasteride, zolpidem, buPROPion, omeprazole, ranitidine, and HYDROcodone-acetaminophen.  Meds ordered this encounter  Medications  . omeprazole (PRILOSEC) 40 MG capsule    Sig: Take 1 capsule by mouth every morning.    Refill:  0  . ranitidine (ZANTAC) 300 MG tablet    Sig: Take 21 tablets by mouth at bedtime.    Refill:  0  . HYDROcodone-acetaminophen (NORCO/VICODIN) 5-325 MG tablet    Sig: Take 1 tablet by mouth every 6 (six) hours as needed. for pain    Refill:  0     Follow-up: No Follow-up on file.  Walker Kehr, MD

## 2015-01-04 NOTE — Assessment & Plan Note (Addendum)
10/16 T#4  Potential benefits of a long term opioids use as well as potential risks (i.e. addiction risk, apnea etc) and complications (i.e. Somnolence, constipation and others) were explained to the patient and were aknowledged.

## 2015-01-04 NOTE — Assessment & Plan Note (Signed)
Chronic On Wellbutrin 

## 2015-01-04 NOTE — Assessment & Plan Note (Signed)
On [name]  

## 2015-01-04 NOTE — Progress Notes (Signed)
Pre visit review using our clinic review tool, if applicable. No additional management support is needed unless otherwise documented below in the visit note. 

## 2015-01-04 NOTE — Assessment & Plan Note (Signed)
Pt saw an allergist: ?cyclical cough. Tramadol was prescribed.Marland KitchenMarland Kitchen

## 2015-01-05 ENCOUNTER — Other Ambulatory Visit: Payer: Self-pay | Admitting: Internal Medicine

## 2015-02-08 ENCOUNTER — Other Ambulatory Visit: Payer: Self-pay | Admitting: Internal Medicine

## 2015-03-15 ENCOUNTER — Telehealth: Payer: Self-pay | Admitting: Internal Medicine

## 2015-03-15 NOTE — Telephone Encounter (Signed)
Ok to ref early Thx 

## 2015-03-15 NOTE — Telephone Encounter (Signed)
Pt called in said he has lost his bottle of buPROPion (WELLBUTRIN XL) 300 MG 24 hr tablet [id number] ,.  He can not find it any where.  What can he do?   Can he get more called in?

## 2015-03-16 ENCOUNTER — Other Ambulatory Visit: Payer: Self-pay

## 2015-03-16 MED ORDER — BUPROPION HCL ER (XL) 300 MG PO TB24: 300.0000 mg | ORAL_TABLET | Freq: Every day | ORAL | Status: DC

## 2015-03-23 ENCOUNTER — Telehealth: Payer: Self-pay | Admitting: *Deleted

## 2015-03-23 MED ORDER — ZOLPIDEM TARTRATE 10 MG PO TABS: 5.0000 mg | ORAL_TABLET | Freq: Every day | ORAL | Status: DC

## 2015-03-23 NOTE — Telephone Encounter (Signed)
Rf req for Zolpidem 10 mg 1/2-1 po qhs. Last filled 02/15/2015. Ok to Rf?

## 2015-03-23 NOTE — Telephone Encounter (Signed)
Called refill into [facility] had to leave on pharmacist vm. Epic is updated...Paul [name]

## 2015-03-23 NOTE — Telephone Encounter (Signed)
OK to fill this prescription with additional refills x3 Thank you!  

## 2015-03-24 ENCOUNTER — Telehealth: Payer: Self-pay

## 2015-03-24 ENCOUNTER — Other Ambulatory Visit: Payer: Self-pay

## 2015-03-24 MED ORDER — PANTOPRAZOLE SODIUM 40 MG PO TBEC: 40.0000 mg | DELAYED_RELEASE_TABLET | Freq: Two times a day (BID) | ORAL | Status: DC

## 2015-03-24 NOTE — Telephone Encounter (Signed)
Prescription for pantoprazole was printed signed and faxed.

## 2015-04-07 ENCOUNTER — Ambulatory Visit: Payer: 59 | Admitting: Internal Medicine

## 2015-09-05 ENCOUNTER — Other Ambulatory Visit: Payer: Self-pay | Admitting: *Deleted

## 2015-09-05 MED ORDER — FINASTERIDE 5 MG PO TABS: 5.0000 mg | ORAL_TABLET | Freq: Every day | ORAL | Status: DC

## 2015-10-03 ENCOUNTER — Other Ambulatory Visit: Payer: Self-pay | Admitting: Internal Medicine

## 2015-10-09 ENCOUNTER — Other Ambulatory Visit: Payer: Self-pay | Admitting: Internal Medicine

## 2015-10-10 ENCOUNTER — Ambulatory Visit (INDEPENDENT_AMBULATORY_CARE_PROVIDER_SITE_OTHER): Payer: 59 | Admitting: Internal Medicine

## 2015-10-10 ENCOUNTER — Encounter: Payer: Self-pay | Admitting: Internal Medicine

## 2015-10-10 DIAGNOSIS — K2100 Gastro-esophageal reflux disease with esophagitis, without bleeding: Secondary | ICD-10-CM

## 2015-10-10 DIAGNOSIS — K4090 Unilateral inguinal hernia, without obstruction or gangrene, not specified as recurrent: Secondary | ICD-10-CM | POA: Diagnosis not present

## 2015-10-10 DIAGNOSIS — G8929 Other chronic pain: Secondary | ICD-10-CM

## 2015-10-10 DIAGNOSIS — I10 Essential (primary) hypertension: Secondary | ICD-10-CM

## 2015-10-10 DIAGNOSIS — M545 Low back pain: Secondary | ICD-10-CM | POA: Diagnosis not present

## 2015-10-10 MED ORDER — ACETAMINOPHEN-CODEINE 300-60 MG PO TABS: 1.0000 | ORAL_TABLET | Freq: Four times a day (QID) | ORAL | 1 refills | Status: DC | PRN

## 2015-10-10 MED ORDER — FINASTERIDE 5 MG PO TABS: 5.0000 mg | ORAL_TABLET | Freq: Every day | ORAL | 11 refills | Status: DC

## 2015-10-10 NOTE — Progress Notes (Signed)
Pre visit review using our clinic review tool, if applicable. No additional management support is needed unless otherwise documented below in the visit note. 

## 2015-10-10 NOTE — Progress Notes (Signed)
Subjective:  Patient ID: Paul [name]., male    DOB: 1946/01/28  Age: 70 y.o. MRN: [id number]  CC: No chief complaint on file.   HPI Paul [name] [employer]. presents for LBP, BPH, HTN f/u  Outpatient Medications Prior to Visit  Medication Sig Dispense Refill  . acetaminophen-codeine (TYLENOL #4) 300-60 MG tablet TAKE 1 TABLET BY MOUTH EVERY 6 HOURS AS NEEDED FOR MODERATE PAIN 120 tablet 0  . aspirin 81 MG EC tablet Take 81 mg by mouth daily.      Marland Kitchen atorvastatin (LIPITOR) 10 MG tablet take 1 tablet by mouth once daily 60 tablet 4  . [name] 5-40 MG tablet TAKE 1 TABLET BY MOUTH ONCE DAILY 90 tablet 3  . buPROPion (WELLBUTRIN XL) 300 MG 24 hr tablet Take 1 tablet (300 mg total) by mouth daily. 90 tablet 2  . Cholecalciferol 1000 UNITS tablet Take 1,000 Units by mouth daily.      . finasteride (PROSCAR) 5 MG tablet Take 1 tablet (5 mg total) by mouth daily. Overdue for yearly physical w/labs must see MD for refills 30 tablet 0  . loratadine (CLARITIN) 10 MG tablet Take 1 tablet (10 mg total) by mouth daily. 100 tablet 3  . omeprazole (PRILOSEC) 40 MG capsule Take 1 capsule by mouth every morning.  0  . pantoprazole (PROTONIX) 40 MG tablet Take 1 tablet (40 mg total) by mouth 2 (two) times daily. 60 tablet 11  . ranitidine (ZANTAC) 300 MG tablet Take 21 tablets by mouth at bedtime.  0  . zolpidem (AMBIEN) 10 MG tablet Take 0.5-1 tablets (5-10 mg total) by mouth at bedtime. 30 tablet 3  . fluticasone (FLONASE) 50 MCG/ACT nasal spray Place 1 spray into the nose daily.     No facility-administered medications prior to visit.     ROS Review of Systems  Constitutional: Negative for appetite change, fatigue and unexpected weight change.  HENT: Negative for congestion, nosebleeds, sneezing, sore throat and trouble swallowing.   Eyes: Negative for itching and visual disturbance.  Respiratory: Negative for cough.   Cardiovascular: Negative for chest pain, palpitations and leg swelling.    Gastrointestinal: Negative for abdominal distention, blood in stool, diarrhea and nausea.  Genitourinary: Negative for frequency and hematuria.  Musculoskeletal: Negative for back pain, gait problem, joint swelling and neck pain.  Skin: Negative for rash.  Neurological: Negative for dizziness, tremors, speech difficulty and weakness.  Psychiatric/Behavioral: Negative for agitation, dysphoric mood and sleep disturbance. The patient is not nervous/anxious.     Objective:  BP 138/80   Pulse 73   Wt 207 lb (93.9 kg)   BMI 27.31 kg/m   BP Readings from Last 3 Encounters:  10/10/15 138/80  01/04/15 (!) 150/90  08/13/14 140/80    Wt Readings from Last 3 Encounters:  10/10/15 207 lb (93.9 kg)  01/04/15 206 lb (93.4 kg)  08/13/14 203 lb (92.1 kg)    Physical Exam  Constitutional: He is oriented to person, place, and time. He appears well-developed. No distress.  NAD  HENT:  Mouth/Throat: Oropharynx is clear and moist.  Eyes: Conjunctivae are normal. Pupils are equal, round, and reactive to light.  Neck: Normal range of motion. No JVD present. No thyromegaly present.  Cardiovascular: Normal rate, regular rhythm, normal heart sounds and intact distal pulses.  Exam reveals no gallop and no friction rub.   No murmur heard. Pulmonary/Chest: Effort normal and breath sounds normal. No respiratory distress. He has no wheezes. He has no  rales. He exhibits no tenderness.  Abdominal: Soft. Bowel sounds are normal. He exhibits no distension and no mass. There is no tenderness. There is no rebound and no guarding.  Musculoskeletal: Normal range of motion. He exhibits tenderness. He exhibits no edema.  Lymphadenopathy:    He has no cervical adenopathy.  Neurological: He is alert and oriented to person, place, and time. He has normal reflexes. No cranial nerve deficit. He exhibits normal muscle tone. He displays a negative Romberg sign. Coordination and gait normal.  Skin: Skin is warm and dry.  No rash noted.  Psychiatric: He has a normal mood and affect. His behavior is normal. Judgment and thought content normal.  small R inguinal hernia LS is tender  Lab Results  Component Value Date   WBC 7.8 02/11/2014   HGB 14.9 02/11/2014   HCT 44.6 02/11/2014   PLT 300.0 02/11/2014   GLUCOSE 96 02/11/2014   CHOL 176 01/04/2014   TRIG 195.0 (H) 01/04/2014   HDL 39.10 01/04/2014   LDLDIRECT 85.7 04/10/2012   LDLCALC 98 01/04/2014   ALT 24 02/11/2014   AST 33 02/11/2014   NA 137 02/11/2014   K 4.1 02/11/2014   CL 103 02/11/2014   CREATININE 0.9 02/11/2014   BUN 23 02/11/2014   CO2 27 02/11/2014   TSH 2.15 01/04/2014   PSA 0.27 04/10/2012    Ct Chest W Contrast  Result Date: 04/01/2014   Cough.  Abnormal outside chest radiograph. EXAM: CT CHEST WITH CONTRAST TECHNIQUE: Multidetector CT imaging of the chest was performed during intravenous contrast administration. CONTRAST:  75 mL Omnipaque 350 COMPARISON:  None. FINDINGS: Mediastinum/Lymph Nodes: No masses or pathologically enlarged lymph nodes identified. 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 Lungs/Pleura: No pulmonary infiltrate or mass identified. No effusion present. Mild scarring noted in the anterior left upper lobe. Musculoskeletal/Soft Tissues: No suspicious bone lesions or other significant chest wall abnormality. Upper Abdomen: Large right upper pole renal cyst is incompletely visualized. A small less than 1 cm calculus also noted in upper pole of left kidney. IMPRESSION: Mild left upper lobe scarring. No evidence of neoplasm or other active disease within the thorax. Other incidental findings, as above. Electronically Signed   By: Earle Gell M.D.   On: 04/01/2014 13:06     Assessment & Plan:   There are no diagnoses linked to this encounter. I am having Paul [name] maintain his aspirin, Cholecalciferol, fluticasone, loratadine, omeprazole, ranitidine,  atorvastatin, [name], buPROPion, zolpidem, pantoprazole, finasteride, and acetaminophen-codeine.  No orders of the defined types were placed in this encounter.    Follow-up: No Follow-up on file.  Walker Kehr, MD

## 2015-10-10 NOTE — Assessment & Plan Note (Signed)
T#4  Potential benefits of a long term opioids use as well as potential risks (i.e. addiction risk, apnea etc) and complications (i.e. Somnolence, constipation and others) were explained to the patient and were aknowledged.  

## 2015-10-10 NOTE — Assessment & Plan Note (Signed)
On [name]  

## 2015-10-10 NOTE — Assessment & Plan Note (Signed)
On Prilosec ?Potential benefits of a long term PPI use as well as potential risks  and complications were explained to the patient and were aknowledged. ?

## 2015-10-12 NOTE — Telephone Encounter (Signed)
Called refill Into pharmacy left msg on pharmacy vm...Johny Chess

## 2015-11-03 ENCOUNTER — Other Ambulatory Visit: Payer: Self-pay | Admitting: Internal Medicine

## 2015-11-23 ENCOUNTER — Other Ambulatory Visit: Payer: Self-pay | Admitting: Internal Medicine

## 2016-01-29 ENCOUNTER — Other Ambulatory Visit: Payer: Self-pay | Admitting: Internal Medicine

## 2016-03-04 ENCOUNTER — Other Ambulatory Visit: Payer: Self-pay | Admitting: Internal Medicine

## 2016-03-13 ENCOUNTER — Encounter: Payer: Self-pay | Admitting: Family Medicine

## 2016-03-13 ENCOUNTER — Ambulatory Visit (INDEPENDENT_AMBULATORY_CARE_PROVIDER_SITE_OTHER): Payer: 59 | Admitting: Family Medicine

## 2016-03-13 VITALS — BP 160/81 | HR 69 | Temp 98.0°F | Resp 20 | Wt 208.0 lb

## 2016-03-13 DIAGNOSIS — R21 Rash and other nonspecific skin eruption: Secondary | ICD-10-CM

## 2016-03-13 LAB — URINALYSIS, ROUTINE W REFLEX MICROSCOPIC
Bilirubin Urine: NEGATIVE
Glucose, UA: NEGATIVE
HGB URINE DIPSTICK: NEGATIVE
KETONES UR: NEGATIVE
LEUKOCYTES UA: NEGATIVE
NITRITE: NEGATIVE
PROTEIN: NEGATIVE
Specific Gravity, Urine: 1.023 (ref 1.001–1.035)
pH: 5.5 (ref 5.0–8.0)

## 2016-03-13 MED ORDER — HYDROXYZINE PAMOATE 25 MG PO CAPS: 25.0000 mg | ORAL_CAPSULE | Freq: Three times a day (TID) | ORAL | 0 refills | Status: DC | PRN

## 2016-03-13 MED ORDER — METHYLPREDNISOLONE ACETATE 80 MG/ML IJ SUSP
80.0000 mg | Freq: Once | INTRAMUSCULAR | Status: AC
  Administered 2016-03-13: 80 mg via INTRAMUSCULAR

## 2016-03-13 MED ORDER — PREDNISONE 20 MG PO TABS: ORAL_TABLET | ORAL | 0 refills | Status: DC

## 2016-03-13 NOTE — Patient Instructions (Signed)
I am uncertain the cause of your rash today.  Start steroids tomorrow.  Vistaril at night, caution with sedation.   I will call you with lab results once available.  If not seeing improvement in 5-7 days, then call in and we will call in treatment for scabies (but this does not look like scabies and other family members would likely have shown symptoms as well).
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
## 2016-03-14 ENCOUNTER — Telehealth: Payer: Self-pay | Admitting: Family Medicine

## 2016-03-14 LAB — COMPREHENSIVE METABOLIC PANEL
ALT: 18 U/L (ref 9–46)
AST: 31 U/L (ref 10–35)
Albumin: 4.5 g/dL (ref 3.6–5.1)
Alkaline Phosphatase: 63 U/L (ref 40–115)
BILIRUBIN TOTAL: 0.6 mg/dL (ref 0.2–1.2)
BUN: 22 mg/dL (ref 7–25)
CALCIUM: 9.4 mg/dL (ref 8.6–10.3)
CO2: 27 mmol/L (ref 20–31)
CREATININE: 0.84 mg/dL (ref 0.70–1.18)
Chloride: 103 mmol/L (ref 98–110)
GLUCOSE: 95 mg/dL (ref 65–99)
Potassium: 3.9 mmol/L (ref 3.5–5.3)
SODIUM: 139 mmol/L (ref 135–146)
Total Protein: 6.9 g/dL (ref 6.1–8.1)

## 2016-03-14 NOTE — Telephone Encounter (Signed)
Please call pt: - all labs are normal. If rash not improving in 5-7 days with medication please have him call in to our office and we will call in medication for scabies.

## 2016-03-14 NOTE — Telephone Encounter (Signed)
Patient notified and verbalized understanding. 

## 2016-03-23 ENCOUNTER — Other Ambulatory Visit: Payer: Self-pay | Admitting: *Deleted

## 2016-03-30 MED ORDER — ACETAMINOPHEN-CODEINE 300-60 MG PO TABS: 1.0000 | ORAL_TABLET | Freq: Four times a day (QID) | ORAL | 1 refills | Status: DC | PRN

## 2016-03-30 NOTE — Telephone Encounter (Signed)
Faxed to pharm 

## 2016-04-02 ENCOUNTER — Telehealth: Payer: Self-pay | Admitting: Geriatric Medicine

## 2016-04-02 NOTE — Telephone Encounter (Signed)
PA for Acetaminophen-Codeine has been initiated.

## 2016-04-06 ENCOUNTER — Ambulatory Visit (INDEPENDENT_AMBULATORY_CARE_PROVIDER_SITE_OTHER): Payer: Managed Care, Other (non HMO) | Admitting: Internal Medicine

## 2016-04-06 ENCOUNTER — Encounter: Payer: Self-pay | Admitting: Internal Medicine

## 2016-04-06 DIAGNOSIS — R21 Rash and other nonspecific skin eruption: Secondary | ICD-10-CM

## 2016-04-06 MED ORDER — ACETAMINOPHEN-CODEINE 300-60 MG PO TABS: 1.0000 | ORAL_TABLET | Freq: Four times a day (QID) | ORAL | 1 refills | Status: DC | PRN

## 2016-04-06 MED ORDER — TRIAMCINOLONE ACETONIDE 0.1 % EX CREA: 1.0000 "application " | TOPICAL_CREAM | Freq: Three times a day (TID) | CUTANEOUS | 1 refills | Status: DC

## 2016-04-06 NOTE — Progress Notes (Signed)
Pre visit review using our clinic review tool, if applicable. No additional management support is needed unless otherwise documented below in the visit note. 

## 2016-04-06 NOTE — Patient Instructions (Signed)
Stop on Rx at the time: Protonix---[name]---Lipitor

## 2016-04-06 NOTE — Progress Notes (Signed)
Subjective:  Patient ID: Paul [name]., male    DOB: [birth year]  Age: 71 y.o. MRN: [id number]  CC: Rash (on bilateral arms, thighs and chest)   HPI Paul [name]. presents for a rash x 4 weeks - steroids helped, now the rash is coming back F/u HTN, OA  Outpatient Medications Prior to Visit  Medication Sig Dispense Refill  . acetaminophen-codeine (TYLENOL #4) 300-60 MG tablet Take 1 tablet by mouth every 6 (six) hours as needed for pain. 120 tablet 1  . amLODipine-olmesartan ([name]) 5-40 MG tablet take 1 tablet by mouth once daily 90 tablet 3  . aspirin 81 MG EC tablet Take 81 mg by mouth daily.      Marland Kitchen atorvastatin (LIPITOR) 10 MG tablet take 1 tablet by mouth once daily 30 tablet 4  . buPROPion (WELLBUTRIN XL) 300 MG 24 hr tablet Take 1 tablet (300 mg total) by mouth daily. -patient needs office visit before any further refills 90 tablet 0  . Cholecalciferol 1000 UNITS tablet Take 1,000 Units by mouth daily.      . finasteride (PROSCAR) 5 MG tablet Take 1 tablet (5 mg total) by mouth daily. 30 tablet 11  . hydrOXYzine (VISTARIL) 25 MG capsule Take 1 capsule (25 mg total) by mouth 3 (three) times daily as needed. 30 capsule 0  . loratadine (CLARITIN) 10 MG tablet take 1 tablet by mouth once daily 100 tablet 3  . pantoprazole (PROTONIX) 40 MG tablet Take 1 tablet (40 mg total) by mouth 2 (two) times daily. 60 tablet 11  . zolpidem (AMBIEN) 10 MG tablet take 1/2 to 1 tablet by mouth at bedtime if needed 30 tablet 3  . predniSONE (DELTASONE) 20 MG tablet 60 mg x3d, 40 mg x3d, 20 mg x2d, 10 mg x2d 18 tablet 0  . fluticasone (FLONASE) 50 MCG/ACT nasal spray Place 1 spray into the nose daily.     No facility-administered medications prior to visit.     ROS Review of Systems  Constitutional: Negative for appetite change, fatigue and unexpected weight change.  HENT: Negative for congestion, nosebleeds, sneezing, sore throat and trouble swallowing.   Eyes: Negative for itching  and visual disturbance.  Respiratory: Negative for cough.   Cardiovascular: Negative for chest pain, palpitations and leg swelling.  Gastrointestinal: Negative for abdominal distention, blood in stool, diarrhea and nausea.  Genitourinary: Negative for frequency and hematuria.  Musculoskeletal: Positive for arthralgias and back pain. Negative for gait problem, joint swelling and neck pain.  Skin: Positive for rash.  Neurological: Negative for dizziness, tremors, speech difficulty and weakness.  Psychiatric/Behavioral: Negative for agitation, dysphoric mood and sleep disturbance. The patient is not nervous/anxious.     Objective:  BP (!) 170/90   Pulse 75   Temp 98.2 F (36.8 C) (Oral)   Wt 210 lb (95.3 kg)   SpO2 98%   BMI 27.71 kg/m   BP Readings from Last 3 Encounters:  04/06/16 (!) 170/90  03/13/16 (!) 160/81  10/10/15 138/80    Wt Readings from Last 3 Encounters:  04/06/16 210 lb (95.3 kg)  03/13/16 208 lb (94.3 kg)  10/10/15 207 lb (93.9 kg)    Physical Exam  Constitutional: He is oriented to person, place, and time. He appears well-developed. No distress.  NAD  HENT:  Mouth/Throat: Oropharynx is clear and moist.  Eyes: Conjunctivae are normal. Pupils are equal, round, and reactive to light.  Neck: Normal range of motion. No JVD present. No thyromegaly present.  Cardiovascular: Normal rate, regular rhythm, normal heart sounds and intact distal pulses.  Exam reveals no gallop and no friction rub.   No murmur heard. Pulmonary/Chest: Effort normal and breath sounds normal. No respiratory distress. He has no wheezes. He has no rales. He exhibits no tenderness.  Abdominal: Soft. Bowel sounds are normal. He exhibits no distension and no mass. There is no tenderness. There is no rebound and no guarding.  Musculoskeletal: Normal range of motion. He exhibits no edema.  Lymphadenopathy:    He has no cervical adenopathy.  Neurological: He is alert and oriented to person,  place, and time. He has normal reflexes. No cranial nerve deficit. He exhibits normal muscle tone. He displays a negative Romberg sign. Coordination and gait normal.  Skin: Skin is warm and dry. Rash noted.  Psychiatric: He has a normal mood and affect. His behavior is normal. Judgment and thought content normal.    Lab Results  Component Value Date   WBC 7.8 02/11/2014   HGB 14.9 02/11/2014   HCT 44.6 02/11/2014   PLT 300.0 02/11/2014   GLUCOSE 95 03/13/2016   CHOL 176 01/04/2014   TRIG 195.0 (H) 01/04/2014   HDL 39.10 01/04/2014   LDLDIRECT 85.7 04/10/2012   LDLCALC 98 01/04/2014   ALT 18 03/13/2016   AST 31 03/13/2016   NA 139 03/13/2016   K 3.9 03/13/2016   CL 103 03/13/2016   CREATININE 0.84 03/13/2016   BUN 22 03/13/2016   CO2 27 03/13/2016   TSH 2.15 01/04/2014   PSA 0.27 04/10/2012    Ct Chest W Contrast    Assessment & Plan:   There are no diagnoses linked to this encounter. I have discontinued Mr. Wayne's predniSONE. I am also having him maintain his aspirin, Cholecalciferol, fluticasone, pantoprazole, zolpidem, finasteride, atorvastatin, loratadine, amLODipine-olmesartan, buPROPion, hydrOXYzine, and acetaminophen-codeine.  No orders of the defined types were placed in this encounter.    Follow-up: No Follow-up on file.  Walker Kehr, MD

## 2016-04-06 NOTE — Assessment & Plan Note (Signed)
12/17 - ?drug reaction Stop on Rx at the time: Protonix---[name]---Lipitor

## 2016-04-07 ENCOUNTER — Other Ambulatory Visit: Payer: Self-pay | Admitting: Internal Medicine

## 2016-04-09 ENCOUNTER — Encounter: Payer: 59 | Admitting: Internal Medicine

## 2016-04-21 ENCOUNTER — Other Ambulatory Visit: Payer: Self-pay | Admitting: Internal Medicine

## 2016-04-30 ENCOUNTER — Other Ambulatory Visit: Payer: Self-pay | Admitting: Internal Medicine

## 2016-04-30 NOTE — Telephone Encounter (Signed)
Routing to dr plotnikov, please advise, thanks 

## 2016-04-30 NOTE — Telephone Encounter (Signed)
OK to fill this/these prescription(s) with additional refills x11  Thank you!  

## 2016-04-30 NOTE — Telephone Encounter (Signed)
Pt called in and said that he had an reaction to the pantoprazole (PROTONIX) 40 MG tablet [id number]  What can he take in it place.  He can not take this med?

## 2016-05-01 MED ORDER — PANTOPRAZOLE SODIUM 40 MG PO TBEC: 40.0000 mg | DELAYED_RELEASE_TABLET | Freq: Two times a day (BID) | ORAL | 11 refills | Status: DC

## 2016-05-01 NOTE — Telephone Encounter (Signed)
Protonix Thx

## 2016-05-01 NOTE — Telephone Encounter (Signed)
Which medication do you want refilled, routing back to dr plotnikov

## 2016-05-06 ENCOUNTER — Other Ambulatory Visit: Payer: Self-pay | Admitting: Internal Medicine

## 2016-05-08 NOTE — Telephone Encounter (Signed)
Routed to dr plotnikov, please advise, thanks

## 2016-05-08 NOTE — Telephone Encounter (Signed)
Patient states protonix has caused him a rash.  Patient needs something else instead of this medication.   Patient needs ambien refilled.   Patient uses [facility] on [address]

## 2016-05-09 ENCOUNTER — Other Ambulatory Visit: Payer: Self-pay

## 2016-05-09 MED ORDER — ZOLPIDEM TARTRATE 10 MG PO TABS: ORAL_TABLET | ORAL | 3 refills | Status: DC

## 2016-05-09 NOTE — Telephone Encounter (Signed)
Stop Protonix Start Aciphex Ok to ref Zolpidem Thx

## 2016-05-09 NOTE — Telephone Encounter (Signed)
ambien called into [facility]/[place]---patient advised to stop protonix and start aciphex---this med also sent to [facility]

## 2016-05-09 NOTE — Progress Notes (Unsigned)
Called in [place] rx to [facility]/[place]

## 2016-05-18 ENCOUNTER — Encounter: Payer: Managed Care, Other (non HMO) | Admitting: Internal Medicine

## 2016-05-22 ENCOUNTER — Encounter: Payer: Managed Care, Other (non HMO) | Admitting: Internal Medicine

## 2016-05-28 ENCOUNTER — Encounter: Payer: Self-pay | Admitting: Internal Medicine

## 2016-05-28 ENCOUNTER — Ambulatory Visit (INDEPENDENT_AMBULATORY_CARE_PROVIDER_SITE_OTHER): Payer: Managed Care, Other (non HMO) | Admitting: Internal Medicine

## 2016-05-28 VITALS — BP 146/90 | HR 69 | Temp 98.3°F | Resp 16 | Ht 72.0 in | Wt 210.0 lb

## 2016-05-28 DIAGNOSIS — R21 Rash and other nonspecific skin eruption: Secondary | ICD-10-CM

## 2016-05-28 DIAGNOSIS — Z0000 Encounter for general adult medical examination without abnormal findings: Secondary | ICD-10-CM | POA: Diagnosis not present

## 2016-05-28 DIAGNOSIS — I10 Essential (primary) hypertension: Secondary | ICD-10-CM

## 2016-05-28 DIAGNOSIS — Z23 Encounter for immunization: Secondary | ICD-10-CM | POA: Diagnosis not present

## 2016-05-28 NOTE — Assessment & Plan Note (Addendum)
We discussed age appropriate health related issues, including available/recomended screening tests and vaccinations. We discussed a need for adhering to healthy diet and exercise. Labs/EKG were reviewed/ordered. All questions were answered. Shingrix suggested

## 2016-05-28 NOTE — Progress Notes (Signed)
Pre-visit discussion using our clinic review tool. No additional management support is needed unless otherwise documented below in the visit note.  

## 2016-05-28 NOTE — Assessment & Plan Note (Signed)
[name] 

## 2016-05-28 NOTE — Assessment & Plan Note (Signed)
Use SebaMed soap

## 2016-05-28 NOTE — Patient Instructions (Signed)
Use [employer]baMed soap

## 2016-05-28 NOTE — Progress Notes (Signed)
Subjective:  Patient ID: Paul [name]., male    DOB: January 27, 1946  Age: 71 y.o. MRN: [id number]  CC: Annual Exam (rash, )   HPI Paul [name]. presents for a well exam Rash was related to ? - no rash now  Outpatient Medications Prior to Visit  Medication Sig Dispense Refill  . acetaminophen-codeine (TYLENOL #4) 300-60 MG tablet Take 1 tablet by mouth every 6 (six) hours as needed for pain. 120 tablet 1  . amLODipine-olmesartan ([name]) 5-40 MG tablet take 1 tablet by mouth once daily 90 tablet 3  . atorvastatin (LIPITOR) 10 MG tablet take 1 tablet by mouth once daily 30 tablet 5  . buPROPion (WELLBUTRIN XL) 300 MG 24 hr tablet Take 1 tablet (300 mg total) by mouth daily. -patient needs office visit before any further refills 90 tablet 0  . Cholecalciferol 1000 UNITS tablet Take 1,000 Units by mouth daily.      . finasteride (PROSCAR) 5 MG tablet Take 1 tablet (5 mg total) by mouth daily. 30 tablet 11  . loratadine (CLARITIN) 10 MG tablet take 1 tablet by mouth once daily (Patient taking differently: take 1 tablet by mouth once daily as needed) 100 tablet 3  . triamcinolone cream (KENALOG) 0.1 % Apply 1 application topically 3 (three) times daily. 240 g 1  . zolpidem (AMBIEN) 10 MG tablet take 1/2 to 1 tablet by mouth at bedtime if needed 30 tablet 3  . aspirin 81 MG EC tablet Take 81 mg by mouth daily.      . fluticasone (FLONASE) 50 MCG/ACT nasal spray Place 1 spray into the nose daily.    . hydrOXYzine (VISTARIL) 25 MG capsule Take 1 capsule (25 mg total) by mouth 3 (three) times daily as needed. 30 capsule 0   No facility-administered medications prior to visit.     ROS Review of Systems  Constitutional: Negative for appetite change, fatigue and unexpected weight change.  HENT: Negative for congestion, nosebleeds, sneezing, sore throat and trouble swallowing.   Eyes: Negative for itching and visual disturbance.  Respiratory: Negative for cough.   Cardiovascular:  Negative for chest pain, palpitations and leg swelling.  Gastrointestinal: Negative for abdominal distention, blood in stool, diarrhea and nausea.  Genitourinary: Negative for frequency and hematuria.  Musculoskeletal: Positive for arthralgias. Negative for back pain, gait problem, joint swelling and neck pain.  Skin: Negative for rash.  Neurological: Negative for dizziness, tremors, speech difficulty and weakness.  Psychiatric/Behavioral: Negative for agitation, dysphoric mood and sleep disturbance. The patient is nervous/anxious.     Objective:  BP (!) 146/90   Pulse 69   Temp 98.3 F (36.8 C) (Oral)   Resp 16   Ht 6' (1.829 m)   Wt 210 lb (95.3 kg)   SpO2 97%   BMI 28.48 kg/m   BP Readings from Last 3 Encounters:  05/28/16 (!) 146/90  04/06/16 (!) 170/90  03/13/16 (!) 160/81    Wt Readings from Last 3 Encounters:  05/28/16 210 lb (95.3 kg)  04/06/16 210 lb (95.3 kg)  03/13/16 208 lb (94.3 kg)    Physical Exam  Constitutional: He is oriented to person, place, and time. He appears well-developed. No distress.  NAD  HENT:  Mouth/Throat: Oropharynx is clear and moist.  Eyes: Conjunctivae are normal. Pupils are equal, round, and reactive to light.  Neck: Normal range of motion. No JVD present. No thyromegaly present.  Cardiovascular: Normal rate, regular rhythm, normal heart sounds and intact distal pulses.  Exam reveals no gallop and no friction rub.   No murmur heard. Pulmonary/Chest: Effort normal and breath sounds normal. No respiratory distress. He has no wheezes. He has no rales. He exhibits no tenderness.  Abdominal: Soft. Bowel sounds are normal. He exhibits no distension and no mass. There is no tenderness. There is no rebound and no guarding.  Genitourinary: Rectum normal. Rectal exam shows guaiac negative stool.  Musculoskeletal: Normal range of motion. He exhibits no edema or tenderness.  Lymphadenopathy:    He has no cervical adenopathy.  Neurological: He is  alert and oriented to person, place, and time. He has normal reflexes. No cranial nerve deficit. He exhibits normal muscle tone. He displays a negative Romberg sign. Coordination and gait normal.  Skin: Skin is warm and dry. No rash noted.  Psychiatric: He has a normal mood and affect. His behavior is normal. Judgment and thought content normal.  prostate 1+  Lab Results  Component Value Date   WBC 7.8 02/11/2014   HGB 14.9 02/11/2014   HCT 44.6 02/11/2014   PLT 300.0 02/11/2014   GLUCOSE 95 03/13/2016   CHOL 176 01/04/2014   TRIG 195.0 (H) 01/04/2014   HDL 39.10 01/04/2014   LDLDIRECT 85.7 04/10/2012   LDLCALC 98 01/04/2014   ALT 18 03/13/2016   AST 31 03/13/2016   NA 139 03/13/2016   K 3.9 03/13/2016   CL 103 03/13/2016   CREATININE 0.84 03/13/2016   BUN 22 03/13/2016   CO2 27 03/13/2016   TSH 2.15 01/04/2014   PSA 0.27 04/10/2012    Ct Chest W Contrast  Result Date: 04/01/2014  Cough.  Abnormal outside chest radiograph. EXAM: CT CHEST WITH CONTRAST TECHNIQUE: Multidetector CT imaging of the chest was performed during intravenous contrast administration. CONTRAST:  75 mL Omnipaque 350 COMPARISON:  None. FINDINGS: Mediastinum/Lymph Nodes: No masses or pathologically enlarged lymph nodes identified. 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 Lungs/Pleura: No pulmonary infiltrate or mass identified. No effusion present. Mild scarring noted in the anterior left upper lobe. Musculoskeletal/Soft Tissues: No suspicious bone lesions or other significant chest wall abnormality. Upper Abdomen: Large right upper pole renal cyst is incompletely visualized. A small less than 1 cm calculus also noted in upper pole of left kidney. IMPRESSION: Mild left upper lobe scarring. No evidence of neoplasm or other active disease within the thorax. Other incidental findings, as above. Electronically Signed   By: Paul [name] M.D.   On: 04/01/2014  13:06     Assessment & Plan:   Paul [name] was seen today for annual exam.  Diagnoses and all orders for this visit:  Encounter for immunization -     Flu vaccine HIGH DOSE PF   I have discontinued [name]'s aspirin, fluticasone, and hydrOXYzine. I am also having him maintain his Cholecalciferol, finasteride, loratadine, amLODipine-olmesartan, buPROPion, acetaminophen-codeine, triamcinolone cream, atorvastatin, zolpidem, and pantoprazole.  Meds ordered this encounter  Medications  . pantoprazole (PROTONIX) 40 MG tablet    Sig: Take 40 mg by mouth daily.     Follow-up: No Follow-up on file.  Walker Kehr, MD

## 2016-05-30 ENCOUNTER — Other Ambulatory Visit (INDEPENDENT_AMBULATORY_CARE_PROVIDER_SITE_OTHER): Payer: Managed Care, Other (non HMO)

## 2016-05-30 ENCOUNTER — Encounter: Payer: Self-pay | Admitting: Internal Medicine

## 2016-05-30 DIAGNOSIS — Z0000 Encounter for general adult medical examination without abnormal findings: Secondary | ICD-10-CM

## 2016-05-30 DIAGNOSIS — R7989 Other specified abnormal findings of blood chemistry: Secondary | ICD-10-CM | POA: Diagnosis not present

## 2016-05-30 LAB — BASIC METABOLIC PANEL
BUN: 26 mg/dL — ABNORMAL HIGH (ref 6–23)
CALCIUM: 9.6 mg/dL (ref 8.4–10.5)
CO2: 30 mEq/L (ref 19–32)
CREATININE: 0.98 mg/dL (ref 0.40–1.50)
Chloride: 106 mEq/L (ref 96–112)
GFR: 79.95 mL/min (ref >60.00)
GLUCOSE: 109 mg/dL — AB (ref 70–99)
Potassium: 4.6 mEq/L (ref 3.5–5.1)
SODIUM: 142 meq/L (ref 135–145)

## 2016-05-30 LAB — URINALYSIS
HGB URINE DIPSTICK: NEGATIVE
Leukocytes, UA: NEGATIVE
Nitrite: NEGATIVE
Specific Gravity, Urine: >=1.03 — AB (ref 1.000–1.030)
TOTAL PROTEIN, URINE-UPE24: NEGATIVE
URINE GLUCOSE: NEGATIVE
UROBILINOGEN UA: 0.2 (ref 0.0–1.0)
pH: 6 (ref 5.0–8.0)

## 2016-05-30 LAB — PSA: PSA: 0.2 ng/mL (ref 0.10–4.00)

## 2016-05-30 LAB — CBC WITH DIFFERENTIAL/PLATELET
BASOS ABS: 0.1 10*3/uL (ref 0.0–0.1)
Basophils Relative: 0.9 % (ref 0.0–3.0)
EOS ABS: 0.2 10*3/uL (ref 0.0–0.7)
Eosinophils Relative: 2.9 % (ref 0.0–5.0)
HCT: 42.4 % (ref 39.0–52.0)
Hemoglobin: 14.6 g/dL (ref 13.0–17.0)
LYMPHS ABS: 2 10*3/uL (ref 0.7–4.0)
Lymphocytes Relative: 29.6 % (ref 12.0–46.0)
MCHC: 34.5 g/dL (ref 30.0–36.0)
MCV: 89.5 fl (ref 78.0–100.0)
MONO ABS: 0.8 10*3/uL (ref 0.1–1.0)
MONOS PCT: 12.5 % — AB (ref 3.0–12.0)
NEUTROS PCT: 54.1 % (ref 43.0–77.0)
Neutro Abs: 3.6 10*3/uL (ref 1.4–7.7)
PLATELETS: 276 10*3/uL (ref 150.0–400.0)
RBC: 4.73 Mil/uL (ref 4.22–5.81)
RDW: 13 % (ref 11.5–15.5)
WBC: 6.7 10*3/uL (ref 4.0–10.5)

## 2016-05-30 LAB — LIPID PANEL
CHOL/HDL RATIO: 5
Cholesterol: 195 mg/dL (ref 0–200)
HDL: 37.3 mg/dL — AB (ref >39.00)
Triglycerides: 422 mg/dL — ABNORMAL HIGH (ref 0.0–149.0)

## 2016-05-30 LAB — HEPATIC FUNCTION PANEL
ALK PHOS: 53 U/L (ref 39–117)
ALT: 18 U/L (ref 0–53)
AST: 22 U/L (ref 0–37)
Albumin: 4.1 g/dL (ref 3.5–5.2)
BILIRUBIN DIRECT: 0.1 mg/dL (ref 0.0–0.3)
BILIRUBIN TOTAL: 0.4 mg/dL (ref 0.2–1.2)
Total Protein: 5.8 g/dL — ABNORMAL LOW (ref 6.0–8.3)

## 2016-05-30 LAB — LDL CHOLESTEROL, DIRECT: LDL DIRECT: 82 mg/dL

## 2016-05-30 LAB — TSH: TSH: 3.6 u[IU]/mL (ref 0.35–4.50)

## 2016-06-01 ENCOUNTER — Other Ambulatory Visit: Payer: Self-pay | Admitting: Internal Medicine

## 2016-06-01 MED ORDER — FENOFIBRATE 48 MG PO TABS: 48.0000 mg | ORAL_TABLET | Freq: Every day | ORAL | 11 refills | Status: DC

## 2016-06-10 ENCOUNTER — Other Ambulatory Visit: Payer: Self-pay | Admitting: Internal Medicine

## 2016-08-13 ENCOUNTER — Encounter: Payer: Self-pay | Admitting: Internal Medicine

## 2016-08-14 ENCOUNTER — Other Ambulatory Visit: Payer: Self-pay | Admitting: Internal Medicine

## 2016-08-14 DIAGNOSIS — E785 Hyperlipidemia, unspecified: Secondary | ICD-10-CM

## 2016-08-17 ENCOUNTER — Other Ambulatory Visit: Payer: Self-pay | Admitting: Internal Medicine

## 2016-08-17 MED ORDER — ACETAMINOPHEN-CODEINE 300-60 MG PO TABS: 1.0000 | ORAL_TABLET | Freq: Four times a day (QID) | ORAL | 1 refills | Status: DC | PRN

## 2016-08-17 NOTE — Telephone Encounter (Signed)
Please advise, if okay RX loaded to print

## 2016-08-17 NOTE — Telephone Encounter (Signed)
Patient has tried to get Tylenol #4 filled through his pharmacy.  I do not see request in chart.  Patient states he is currently out of medication and cough is coming back.  Patient would like script sent to [facility] at the corner of [address]  Please follow up with patient once script is sent.  Patient will probably be making [facility] in [place] is primary pharmacy.

## 2016-08-20 ENCOUNTER — Telehealth: Payer: Self-pay | Admitting: Internal Medicine

## 2016-08-20 NOTE — Telephone Encounter (Signed)
Pt pharmacy is out of stock of acetaminophen-codeine (TYLENOL #4) 300-60 MG tablet   Please send to [facility] on [address] in [place]

## 2016-08-20 NOTE — Telephone Encounter (Signed)
Called in.

## 2016-08-20 NOTE — Telephone Encounter (Signed)
RX called in, [facility] could not transfer

## 2016-09-05 ENCOUNTER — Other Ambulatory Visit (INDEPENDENT_AMBULATORY_CARE_PROVIDER_SITE_OTHER): Payer: Managed Care, Other (non HMO)

## 2016-09-05 DIAGNOSIS — E785 Hyperlipidemia, unspecified: Secondary | ICD-10-CM | POA: Diagnosis not present

## 2016-09-05 LAB — BASIC METABOLIC PANEL
BUN: 26 mg/dL — ABNORMAL HIGH (ref 6–23)
CALCIUM: 9.4 mg/dL (ref 8.4–10.5)
CHLORIDE: 106 meq/L (ref 96–112)
CO2: 29 meq/L (ref 19–32)
Creatinine, Ser: 1.12 mg/dL (ref 0.40–1.50)
GFR: 68.48 mL/min (ref >60.00)
Glucose, Bld: 112 mg/dL — ABNORMAL HIGH (ref 70–99)
Potassium: 4.5 mEq/L (ref 3.5–5.1)
SODIUM: 139 meq/L (ref 135–145)

## 2016-09-05 LAB — HEPATIC FUNCTION PANEL
ALK PHOS: 50 U/L (ref 39–117)
ALT: 19 U/L (ref 0–53)
AST: 31 U/L (ref 0–37)
Albumin: 4.1 g/dL (ref 3.5–5.2)
BILIRUBIN DIRECT: 0.1 mg/dL (ref 0.0–0.3)
TOTAL PROTEIN: 6.1 g/dL (ref 6.0–8.3)
Total Bilirubin: 0.3 mg/dL (ref 0.2–1.2)

## 2016-09-05 LAB — LDL CHOLESTEROL, DIRECT: LDL DIRECT: 67 mg/dL

## 2016-09-05 LAB — LIPID PANEL
CHOL/HDL RATIO: 4
Cholesterol: 135 mg/dL (ref 0–200)
HDL: 33 mg/dL — AB (ref >39.00)
NonHDL: 101.5
Triglycerides: 220 mg/dL — ABNORMAL HIGH (ref 0.0–149.0)
VLDL: 44 mg/dL — ABNORMAL HIGH (ref 0.0–40.0)

## 2016-09-07 ENCOUNTER — Encounter: Payer: Self-pay | Admitting: Internal Medicine

## 2016-09-07 ENCOUNTER — Ambulatory Visit (INDEPENDENT_AMBULATORY_CARE_PROVIDER_SITE_OTHER): Payer: Managed Care, Other (non HMO) | Admitting: Internal Medicine

## 2016-09-07 DIAGNOSIS — E785 Hyperlipidemia, unspecified: Secondary | ICD-10-CM

## 2016-09-07 DIAGNOSIS — F411 Generalized anxiety disorder: Secondary | ICD-10-CM

## 2016-09-07 DIAGNOSIS — G4700 Insomnia, unspecified: Secondary | ICD-10-CM | POA: Diagnosis not present

## 2016-09-07 NOTE — Assessment & Plan Note (Signed)
wellbutrin XL 

## 2016-09-07 NOTE — Progress Notes (Signed)
Subjective:  Patient ID: [name], male    DOB: 08/29/1945  Age: 71 y.o. MRN: [id number]  CC: No chief complaint on file.   HPI Issam Carlyon [employer]. presents for LBP, depression, BPH, dyslipidemia f/u.  Outpatient Medications Prior to Visit  Medication Sig Dispense Refill  . acetaminophen-codeine (TYLENOL #4) 300-60 MG tablet Take 1 tablet by mouth every 6 (six) hours as needed for pain. 120 tablet 1  . amLODipine-olmesartan ([name]) 5-40 MG tablet take 1 tablet by mouth once daily 90 tablet 3  . atorvastatin (LIPITOR) 10 MG tablet take 1 tablet by mouth once daily 30 tablet 5  . buPROPion (WELLBUTRIN XL) 300 MG 24 hr tablet take 1 tablet by mouth once daily 90 tablet 3  . Cholecalciferol 1000 UNITS tablet Take 1,000 Units by mouth daily.      . fenofibrate (TRICOR) 48 MG tablet Take 1 tablet (48 mg total) by mouth daily. 30 tablet 11  . finasteride (PROSCAR) 5 MG tablet Take 1 tablet (5 mg total) by mouth daily. 30 tablet 11  . loratadine (CLARITIN) 10 MG tablet take 1 tablet by mouth once daily (Patient taking differently: take 1 tablet by mouth once daily as needed) 100 tablet 3  . pantoprazole (PROTONIX) 40 MG tablet Take 40 mg by mouth daily.    Marland Kitchen triamcinolone cream (KENALOG) 0.1 % Apply 1 application topically 3 (three) times daily. 240 g 1  . zolpidem (AMBIEN) 10 MG tablet take 1/2 to 1 tablet by mouth at bedtime if needed 30 tablet 3   No facility-administered medications prior to visit.     ROS Review of Systems  Constitutional: Negative for appetite change, fatigue and unexpected weight change.  HENT: Negative for congestion, nosebleeds, sneezing, sore throat and trouble swallowing.   Eyes: Negative for itching and visual disturbance.  Respiratory: Negative for cough.   Cardiovascular: Negative for chest pain, palpitations and leg swelling.  Gastrointestinal: Negative for abdominal distention, blood in stool, diarrhea and nausea.  Genitourinary: Positive for  frequency. Negative for hematuria.  Musculoskeletal: Positive for arthralgias and back pain. Negative for gait problem, joint swelling and neck pain.  Skin: Negative for rash.  Neurological: Negative for dizziness, tremors, speech difficulty and weakness.  Psychiatric/Behavioral: Negative for agitation, dysphoric mood, sleep disturbance and suicidal ideas. The patient is not nervous/anxious.     Objective:  BP 122/70 (BP Location: Left Arm, Patient Position: Sitting, Cuff Size: Normal)   Pulse 61   Temp 97.8 F (36.6 C) (Oral)   Ht 6' (1.829 m)   Wt 208 lb (94.3 kg)   SpO2 98%   BMI 28.21 kg/m   BP Readings from Last 3 Encounters:  09/07/16 122/70  05/28/16 (!) 146/90  04/06/16 (!) 170/90    Wt Readings from Last 3 Encounters:  09/07/16 208 lb (94.3 kg)  05/28/16 210 lb (95.3 kg)  04/06/16 210 lb (95.3 kg)    Physical Exam  Constitutional: He is oriented to person, place, and time. He appears well-developed. No distress.  NAD  HENT:  Mouth/Throat: Oropharynx is clear and moist.  Eyes: Conjunctivae are normal. Pupils are equal, round, and reactive to light.  Neck: Normal range of motion. No JVD present. No thyromegaly present.  Cardiovascular: Normal rate, regular rhythm, normal heart sounds and intact distal pulses.  Exam reveals no gallop and no friction rub.   No murmur heard. Pulmonary/Chest: Effort normal and breath sounds normal. No respiratory distress. He has no wheezes. He has no  rales. He exhibits no tenderness.  Abdominal: Soft. Bowel sounds are normal. He exhibits no distension and no mass. There is no tenderness. There is no rebound and no guarding.  Musculoskeletal: Normal range of motion. He exhibits no edema or tenderness.  Lymphadenopathy:    He has no cervical adenopathy.  Neurological: He is alert and oriented to person, place, and time. He has normal reflexes. No cranial nerve deficit. He exhibits normal muscle tone. He displays a negative Romberg sign.  Coordination and gait normal.  Skin: Skin is warm and dry. No rash noted.  Psychiatric: He has a normal mood and affect. His behavior is normal. Judgment and thought content normal.    Lab Results  Component Value Date   WBC 6.7 05/30/2016   HGB 14.6 05/30/2016   HCT 42.4 05/30/2016   PLT 276.0 05/30/2016   GLUCOSE 112 (H) 09/05/2016   CHOL 135 09/05/2016   TRIG 220.0 (H) 09/05/2016   HDL 33.00 (L) 09/05/2016   LDLDIRECT 67.0 09/05/2016   LDLCALC 98 01/04/2014   ALT 19 09/05/2016   AST 31 09/05/2016   NA 139 09/05/2016   K 4.5 09/05/2016   CL 106 09/05/2016   CREATININE 1.12 09/05/2016   BUN 26 (H) 09/05/2016   CO2 29 09/05/2016   TSH 3.60 05/30/2016   PSA 0.20 05/30/2016    Ct Chest W Contrast  Result Date: 04/01/2014     Assessment & Plan:   There are no diagnoses linked to this encounter. I am having Mr. Rippetoe maintain his Cholecalciferol, finasteride, loratadine, amLODipine-olmesartan, triamcinolone cream, atorvastatin, zolpidem, pantoprazole, fenofibrate, buPROPion, and acetaminophen-codeine.  No orders of the defined types were placed in this encounter.    Follow-up: No Follow-up on file.  Walker Kehr, MD

## 2016-09-07 NOTE — Assessment & Plan Note (Signed)
Zolpidem prn  Potential benefits of a long term benzodiazepines  use as well as potential risks  and complications were explained to the patient and were aknowledged. 

## 2016-09-07 NOTE — Assessment & Plan Note (Signed)
TG is better on Tricor 

## 2016-09-20 ENCOUNTER — Other Ambulatory Visit: Payer: Self-pay

## 2016-09-20 MED ORDER — FINASTERIDE 5 MG PO TABS: 5.0000 mg | ORAL_TABLET | Freq: Every day | ORAL | 3 refills | Status: DC

## 2016-09-26 ENCOUNTER — Encounter: Payer: Self-pay | Admitting: Internal Medicine

## 2016-09-26 ENCOUNTER — Telehealth: Payer: Self-pay | Admitting: Internal Medicine

## 2016-09-26 NOTE — Telephone Encounter (Signed)
zolpidem (AMBIEN) 10 MG tablet   Patient is requesting a refill on this medication.   [facility] [place], [place] - [place] [phone] (Phone) [phone] (Fax)

## 2016-09-26 NOTE — Telephone Encounter (Signed)
OK to fill this/these prescription(s) with additional refills x2 Thank you!  

## 2016-09-27 MED ORDER — ZOLPIDEM TARTRATE 10 MG PO TABS: ORAL_TABLET | ORAL | 2 refills | Status: DC

## 2016-09-27 NOTE — Telephone Encounter (Signed)
Tried calling pt but could not get through, but refill has been called into [facility] had to leave on pharmacy vm...Paul [name]

## 2016-10-02 ENCOUNTER — Other Ambulatory Visit: Payer: Self-pay

## 2016-10-02 MED ORDER — ATORVASTATIN CALCIUM 10 MG PO TABS: 10.0000 mg | ORAL_TABLET | Freq: Every day | ORAL | 5 refills | Status: DC

## 2016-10-11 ENCOUNTER — Encounter: Payer: Self-pay | Admitting: Internal Medicine

## 2016-10-11 ENCOUNTER — Ambulatory Visit (INDEPENDENT_AMBULATORY_CARE_PROVIDER_SITE_OTHER): Payer: Managed Care, Other (non HMO) | Admitting: Internal Medicine

## 2016-10-11 DIAGNOSIS — M25519 Pain in unspecified shoulder: Secondary | ICD-10-CM | POA: Insufficient documentation

## 2016-10-11 DIAGNOSIS — M25562 Pain in left knee: Secondary | ICD-10-CM | POA: Diagnosis not present

## 2016-10-11 DIAGNOSIS — G8929 Other chronic pain: Secondary | ICD-10-CM

## 2016-10-11 DIAGNOSIS — M25511 Pain in right shoulder: Secondary | ICD-10-CM | POA: Diagnosis not present

## 2016-10-11 MED ORDER — MELOXICAM 15 MG PO TABS: 15.0000 mg | ORAL_TABLET | Freq: Every day | ORAL | 1 refills | Status: DC

## 2016-10-11 NOTE — Assessment & Plan Note (Signed)
R ?bicipital tendomitis Sports med ref

## 2016-10-11 NOTE — Assessment & Plan Note (Signed)
?  meniscal tear Sports med ref

## 2016-10-11 NOTE — Progress Notes (Signed)
Subjective:  Patient ID: Paul [name]., male    DOB: 26-Sep-1945  Age: 71 y.o. MRN: [id number]  CC: No chief complaint on file.   HPI [name] [employer]. presents for L knee inner aspect pain w/ROM in the medial aspect C/o R shoulder pain - long time  Outpatient Medications Prior to Visit  Medication Sig Dispense Refill  . acetaminophen-codeine (TYLENOL #4) 300-60 MG tablet Take 1 tablet by mouth every 6 (six) hours as needed for pain. 120 tablet 1  . amLODipine-olmesartan ([name]) 5-40 MG tablet take 1 tablet by mouth once daily 90 tablet 3  . atorvastatin (LIPITOR) 10 MG tablet Take 1 tablet (10 mg total) by mouth daily. 30 tablet 5  . buPROPion (WELLBUTRIN XL) 300 MG 24 hr tablet take 1 tablet by mouth once daily 90 tablet 3  . Cholecalciferol 1000 UNITS tablet Take 1,000 Units by mouth daily.      . fenofibrate (TRICOR) 48 MG tablet Take 1 tablet (48 mg total) by mouth daily. 30 tablet 11  . finasteride (PROSCAR) 5 MG tablet Take 1 tablet (5 mg total) by mouth daily. 90 tablet 3  . loratadine (CLARITIN) 10 MG tablet take 1 tablet by mouth once daily (Patient taking differently: take 1 tablet by mouth once daily as needed) 100 tablet 3  . pantoprazole (PROTONIX) 40 MG tablet Take 40 mg by mouth daily.    Marland Kitchen triamcinolone cream (KENALOG) 0.1 % Apply 1 application topically 3 (three) times daily. 240 g 1  . zolpidem (AMBIEN) 10 MG tablet take 1/2 to 1 tablet by mouth at bedtime if needed 30 tablet 2   No facility-administered medications prior to visit.     ROS Review of Systems  Constitutional: Negative for appetite change, fatigue and unexpected weight change.  HENT: Negative for congestion, nosebleeds, sneezing, sore throat and trouble swallowing.   Eyes: Negative for itching and visual disturbance.  Respiratory: Negative for cough.   Cardiovascular: Negative for chest pain, palpitations and leg swelling.  Gastrointestinal: Negative for abdominal distention, blood in  stool, diarrhea and nausea.  Genitourinary: Negative for frequency and hematuria.  Musculoskeletal: Positive for arthralgias. Negative for back pain, gait problem, joint swelling and neck pain.  Skin: Negative for rash.  Neurological: Negative for dizziness, tremors, speech difficulty and weakness.  Psychiatric/Behavioral: Negative for agitation, dysphoric mood and sleep disturbance. The patient is not nervous/anxious.     Objective:  BP (!) 142/84 (BP Location: Left Arm, Patient Position: Sitting, Cuff Size: Normal)   Pulse 64   Temp 98 F (36.7 C) (Oral)   Ht 6' (1.829 m)   Wt 207 lb (93.9 kg)   SpO2 99%   BMI 28.07 kg/m   BP Readings from Last 3 Encounters:  10/11/16 (!) 142/84  09/07/16 122/70  05/28/16 (!) 146/90    Wt Readings from Last 3 Encounters:  10/11/16 207 lb (93.9 kg)  09/07/16 208 lb (94.3 kg)  05/28/16 210 lb (95.3 kg)    Physical Exam  Constitutional: He is oriented to person, place, and time. He appears well-developed. No distress.  NAD  HENT:  Mouth/Throat: Oropharynx is clear and moist.  Eyes: Pupils are equal, round, and reactive to light. Conjunctivae are normal.  Neck: Normal range of motion. No JVD present. No thyromegaly present.  Cardiovascular: Normal rate, regular rhythm, normal heart sounds and intact distal pulses.  Exam reveals no gallop and no friction rub.   No murmur heard. Pulmonary/Chest: Effort normal and breath sounds  normal. No respiratory distress. He has no wheezes. He has no rales. He exhibits no tenderness.  Abdominal: Soft. Bowel sounds are normal. He exhibits no distension and no mass. There is no tenderness. There is no rebound and no guarding.  Musculoskeletal: Normal range of motion. He exhibits tenderness. He exhibits no edema.  Lymphadenopathy:    He has no cervical adenopathy.  Neurological: He is alert and oriented to person, place, and time. He has normal reflexes. No cranial nerve deficit. He exhibits normal muscle  tone. He displays a negative Romberg sign. Coordination and gait normal.  Skin: Skin is warm and dry. No rash noted.  Psychiatric: He has a normal mood and affect. His behavior is normal. Judgment and thought content normal.  medial knee is tender L R biceps groove area is tender  Lab Results  Component Value Date   WBC 6.7 05/30/2016   HGB 14.6 05/30/2016   HCT 42.4 05/30/2016   PLT 276.0 05/30/2016   GLUCOSE 112 (H) 09/05/2016   CHOL 135 09/05/2016   TRIG 220.0 (H) 09/05/2016   HDL 33.00 (L) 09/05/2016   LDLDIRECT 67.0 09/05/2016   LDLCALC 98 01/04/2014   ALT 19 09/05/2016   AST 31 09/05/2016   NA 139 09/05/2016   K 4.5 09/05/2016   CL 106 09/05/2016   CREATININE 1.12 09/05/2016   BUN 26 (H) 09/05/2016   CO2 29 09/05/2016   TSH 3.60 05/30/2016   PSA 0.20 05/30/2016    Ct Chest W Contrast    Assessment & Plan:   There are no diagnoses linked to this encounter. I am having [name] maintain his Cholecalciferol, loratadine, amLODipine-olmesartan, triamcinolone cream, pantoprazole, fenofibrate, buPROPion, acetaminophen-codeine, finasteride, zolpidem, and atorvastatin.  No orders of the defined types were placed in this encounter.    Follow-up: No Follow-up on file.  Walker Kehr, MD

## 2016-10-12 ENCOUNTER — Ambulatory Visit (INDEPENDENT_AMBULATORY_CARE_PROVIDER_SITE_OTHER)
Admission: RE | Admit: 2016-10-12 | Discharge: 2016-10-12 | Disposition: A | Discharge status: Home / Self Care | Payer: Managed Care, Other (non HMO) | Source: Ambulatory Visit | Attending: Family Medicine | Admitting: Family Medicine

## 2016-10-12 ENCOUNTER — Encounter: Payer: Self-pay | Admitting: Family Medicine

## 2016-10-12 ENCOUNTER — Ambulatory Visit (INDEPENDENT_AMBULATORY_CARE_PROVIDER_SITE_OTHER): Payer: Managed Care, Other (non HMO) | Admitting: Family Medicine

## 2016-10-12 VITALS — BP 126/76 | HR 66 | Temp 97.9°F | Ht 72.0 in | Wt 208.0 lb

## 2016-10-12 DIAGNOSIS — M7550 Bursitis of unspecified shoulder: Secondary | ICD-10-CM | POA: Insufficient documentation

## 2016-10-12 DIAGNOSIS — G8929 Other chronic pain: Secondary | ICD-10-CM

## 2016-10-12 DIAGNOSIS — M25562 Pain in left knee: Secondary | ICD-10-CM

## 2016-10-12 DIAGNOSIS — M7591 Shoulder lesion, unspecified, right shoulder: Secondary | ICD-10-CM

## 2016-10-12 MED ORDER — NITROGLYCERIN 0.2 MG/HR TD PT24: 0.2000 mg | MEDICATED_PATCH | Freq: Every day | TRANSDERMAL | 12 refills | Status: DC

## 2016-10-12 NOTE — Assessment & Plan Note (Signed)
Pain seems be consistent with a degenerative meniscal tear. No history of trauma. - 4 views of his knee. - If still been problematic to consider an injection in the future.
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
## 2016-10-12 NOTE — Patient Instructions (Addendum)
Thank you for coming in,   Nitroglycerin Protocol   Apply 1/4 nitroglycerin patch to affected area daily.  Change position of patch within the affected area every 24 hours.  You may experience a headache during the first 1-2 weeks of using the patch, these should subside.  If you experience headaches after beginning nitroglycerin patch treatment, you may take your preferred over the counter pain reliever.  Another side effect of the nitroglycerin patch is skin irritation or rash related to patch adhesive.  Please notify our office if you develop more severe headaches or rash, and stop the patch.  Tendon healing with nitroglycerin patch may require 12 to 24 weeks depending on the extent of injury.  Men should not use if taking Viagra, Cialis, or Levitra.   Do not use if you have migraines or rosacea.   Please follow-up with me before your trip and we can inject these areas to help your pain.   Please feel free to call with any questions or concerns at any time, at [phone]. --Dr. Raeford Razor

## 2016-10-12 NOTE — Assessment & Plan Note (Signed)
Findings on exam and ultrasound are suggestive of a supraspinatus tendinopathy. - Initiating nitroglycerin protocol - Provided a home exercise program with Thera band - Could consider a subacromial injection or physical therapy in the future.

## 2016-10-12 NOTE — Assessment & Plan Note (Signed)
Findings on ultrasound are consistent with a subscapularis bursitis. - He will follow-up in a couple weeks to have this aspirated and injected.

## 2016-10-24 ENCOUNTER — Encounter: Payer: Self-pay | Admitting: Family Medicine

## 2016-10-24 ENCOUNTER — Ambulatory Visit (INDEPENDENT_AMBULATORY_CARE_PROVIDER_SITE_OTHER): Payer: Managed Care, Other (non HMO) | Admitting: Family Medicine

## 2016-10-24 VITALS — BP 140/80 | HR 74 | Ht 72.0 in | Wt 208.0 lb

## 2016-10-24 DIAGNOSIS — G8929 Other chronic pain: Secondary | ICD-10-CM

## 2016-10-24 DIAGNOSIS — M25562 Pain in left knee: Secondary | ICD-10-CM | POA: Diagnosis not present

## 2016-10-24 DIAGNOSIS — M7550 Bursitis of unspecified shoulder: Secondary | ICD-10-CM

## 2016-10-24 DIAGNOSIS — M7591 Shoulder lesion, unspecified, right shoulder: Secondary | ICD-10-CM

## 2016-10-24 NOTE — Assessment & Plan Note (Signed)
He will continue to use the nitro glycerin patches. He will follow-up in roughly 4 weeks to re-ultrasound this area. If not much improvement can consider physical therapy at that time. Could also consider a subacromial injection in the future.

## 2016-10-24 NOTE — Patient Instructions (Signed)
Thank you for coming in,      Please feel free to call with any questions or concerns at any time, at [phone]. --Dr. Schmitz  
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
## 2016-10-24 NOTE — Assessment & Plan Note (Signed)
Reports his knee has been doing well. He does not feel that it is significant today. - Provided information for a body helix that he can obtain -Provided pennsaid  - can always inject in the future in problematic or try PT

## 2016-10-24 NOTE — Assessment & Plan Note (Signed)
Aspiration and injection of this area today. Minimal amount was aspirated. - Can follow-up as needed.

## 2016-10-29 ENCOUNTER — Telehealth: Payer: Self-pay

## 2016-10-29 NOTE — Telephone Encounter (Signed)
Key: [id number]

## 2016-10-30 NOTE — Telephone Encounter (Signed)
PA returned stating that there is already a request on file.

## 2016-11-05 ENCOUNTER — Encounter: Payer: Self-pay | Admitting: Internal Medicine
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
## 2016-11-09 ENCOUNTER — Ambulatory Visit: Payer: Self-pay

## 2016-11-09 ENCOUNTER — Encounter: Payer: Self-pay | Admitting: Family Medicine

## 2016-11-09 ENCOUNTER — Ambulatory Visit (INDEPENDENT_AMBULATORY_CARE_PROVIDER_SITE_OTHER): Payer: Managed Care, Other (non HMO) | Admitting: Family Medicine

## 2016-11-09 VITALS — BP 140/86 | HR 66 | Ht 72.0 in | Wt 205.0 lb

## 2016-11-09 DIAGNOSIS — M79601 Pain in right arm: Secondary | ICD-10-CM

## 2016-11-09 DIAGNOSIS — R233 Spontaneous ecchymoses: Secondary | ICD-10-CM

## 2016-11-09 DIAGNOSIS — R238 Other skin changes: Secondary | ICD-10-CM | POA: Insufficient documentation

## 2016-11-09 MED ORDER — DICLOFENAC SODIUM 2 % TD SOLN: 2.0000 "application " | Freq: Two times a day (BID) | TRANSDERMAL | 3 refills | Status: DC

## 2016-11-09 NOTE — Assessment & Plan Note (Signed)
Patient is having easy bruising. Has had difficulty with nosebleeds previously as well as a reflux and patient has a past medical history significant for hepatitis C. Discussed with patient at great length and I do feel laboratory workup is needed. Patient though was on meloxicam and when began about it patient does think that this has some association. Patient will discontinue this at the moment. I do feel though that if this does not completely resolved in the next 10-12 days I would like to get a PT/INR, as well as hepatitis. And see if there is any other organic causes a could be contribute in.

## 2016-11-09 NOTE — Patient Instructions (Signed)
Good to see you  I think we figured it out Stop the meloxicam  pennsaid pinkie amount topically 2 times daily as needed.   Continue everything else at this time.  Lets give it 10 days, if bruise not gone then lets get some labs Otherwise see me when you need me.

## 2016-11-13 ENCOUNTER — Encounter: Payer: Self-pay | Admitting: Family Medicine

## 2016-11-14 MED ORDER — DICLOFENAC SODIUM 2 % TD SOLN: 2.0000 "application " | Freq: Two times a day (BID) | TRANSDERMAL | 3 refills | Status: DC

## 2016-11-22 ENCOUNTER — Ambulatory Visit: Payer: Managed Care, Other (non HMO) | Admitting: Family Medicine

## 2016-12-05 ENCOUNTER — Ambulatory Visit: Payer: Self-pay

## 2016-12-05 ENCOUNTER — Ambulatory Visit (INDEPENDENT_AMBULATORY_CARE_PROVIDER_SITE_OTHER): Payer: Managed Care, Other (non HMO) | Admitting: Family Medicine

## 2016-12-05 ENCOUNTER — Encounter: Payer: Self-pay | Admitting: Family Medicine

## 2016-12-05 VITALS — BP 128/74 | HR 71 | Ht 72.0 in | Wt 207.0 lb

## 2016-12-05 DIAGNOSIS — M75121 Complete rotator cuff tear or rupture of right shoulder, not specified as traumatic: Secondary | ICD-10-CM | POA: Diagnosis not present

## 2016-12-05 DIAGNOSIS — G8929 Other chronic pain: Secondary | ICD-10-CM | POA: Diagnosis not present

## 2016-12-05 DIAGNOSIS — M75100 Unspecified rotator cuff tear or rupture of unspecified shoulder, not specified as traumatic: Secondary | ICD-10-CM | POA: Insufficient documentation

## 2016-12-05 DIAGNOSIS — M25511 Pain in right shoulder: Secondary | ICD-10-CM | POA: Diagnosis not present

## 2016-12-05 NOTE — Patient Instructions (Signed)
Good to see you  Alvera Singh is your friend. Ice 20 minutes 2 times daily. Usually after activity and before bed. We will get a MR-arthorgram.  Continue everything else if you would like.  Depending on MRI we will discuss options.
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
## 2016-12-05 NOTE — Assessment & Plan Note (Signed)
Patient does have a rotator cuff tear. 1 cm retraction. Calcific changes. Seems to be chronic at this time. Patient did get an MRI for further evaluation because she would be a surgical candidate. Ganglion cyst noted of the anterior labrum area that likely is from an anterior labral tear. Do think we should further evaluate this mass is well with a MR arthrogram. Patient will follow-up with me after the imaging and we'll discuss further treatment options.

## 2016-12-10 ENCOUNTER — Other Ambulatory Visit: Payer: Self-pay | Admitting: Internal Medicine
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
## 2016-12-13 ENCOUNTER — Ambulatory Visit (INDEPENDENT_AMBULATORY_CARE_PROVIDER_SITE_OTHER): Payer: Managed Care, Other (non HMO) | Admitting: Family Medicine

## 2016-12-13 ENCOUNTER — Encounter: Payer: Self-pay | Admitting: Family Medicine

## 2016-12-13 DIAGNOSIS — M75121 Complete rotator cuff tear or rupture of right shoulder, not specified as traumatic: Secondary | ICD-10-CM | POA: Diagnosis not present

## 2016-12-13 NOTE — Assessment & Plan Note (Signed)
Injected today because worsening symptoms. Encourage him to continue the nitroglycerin. Patient did have some mild improvement in range of motion but continues to have decreasing rotator cuff strength patient is scheduled for an MRI and would be a good surgical candidate. Follow-up again after MRI.

## 2016-12-13 NOTE — Patient Instructions (Signed)
Hope this helps Ice is your friend.  We will see what the MRI shows.  We will discuss then

## 2016-12-20 ENCOUNTER — Telehealth: Payer: Self-pay

## 2016-12-20 MED ORDER — NITROGLYCERIN 0.2 MG/HR TD PT24: 0.2000 mg | MEDICATED_PATCH | Freq: Every day | TRANSDERMAL | 12 refills | Status: DC

## 2016-12-20 NOTE — Telephone Encounter (Signed)
Refill request for the nitro patches. Please advise.   Pharmacy - [email or web address]

## 2016-12-20 NOTE — Telephone Encounter (Signed)
Ok Thx 

## 2016-12-22 ENCOUNTER — Other Ambulatory Visit: Payer: Self-pay | Admitting: Internal Medicine

## 2016-12-24 ENCOUNTER — Ambulatory Visit
Admission: RE | Admit: 2016-12-24 | Discharge: 2016-12-24 | Disposition: A | Discharge status: Home / Self Care | Payer: Managed Care, Other (non HMO) | Source: Ambulatory Visit | Attending: Family Medicine | Admitting: Family Medicine

## 2016-12-24 DIAGNOSIS — M25511 Pain in right shoulder: Principal | ICD-10-CM

## 2016-12-24 DIAGNOSIS — G8929 Other chronic pain: Secondary | ICD-10-CM

## 2016-12-24 MED ORDER — IOPAMIDOL (ISOVUE-M 200) INJECTION 41%
20.0000 mL | Freq: Once | INTRAMUSCULAR | Status: AC
  Administered 2016-12-24: 20 mL via INTRA_ARTICULAR

## 2017-01-03 ENCOUNTER — Encounter: Payer: Self-pay | Admitting: Family Medicine

## 2017-01-03 DIAGNOSIS — M75101 Unspecified rotator cuff tear or rupture of right shoulder, not specified as traumatic: Secondary | ICD-10-CM

## 2017-01-03 DIAGNOSIS — M25511 Pain in right shoulder: Secondary | ICD-10-CM

## 2017-01-07 ENCOUNTER — Ambulatory Visit: Payer: Managed Care, Other (non HMO) | Admitting: Internal Medicine

## 2017-01-10 HISTORY — PX: ROTATOR CUFF REPAIR: SHX139

## 2017-01-18 ENCOUNTER — Encounter: Payer: Self-pay | Admitting: Internal Medicine

## 2017-01-18 ENCOUNTER — Encounter: Payer: Self-pay | Admitting: Family Medicine

## 2017-01-19 ENCOUNTER — Other Ambulatory Visit: Payer: Self-pay | Admitting: Internal Medicine

## 2017-01-24 ENCOUNTER — Other Ambulatory Visit: Payer: Self-pay

## 2017-01-24 NOTE — Telephone Encounter (Signed)
rf rq to [facility] for zolpidem 10 mg tablets. Please advise.

## 2017-01-25 NOTE — Telephone Encounter (Signed)
Ok to ref, 3 ref Thx

## 2017-01-28 NOTE — Telephone Encounter (Signed)
Rx is pended for you to sign.

## 2017-02-03 MED ORDER — ZOLPIDEM TARTRATE 10 MG PO TABS: ORAL_TABLET | ORAL | 3 refills | Status: DC

## 2017-02-11 DIAGNOSIS — M75121 Complete rotator cuff tear or rupture of right shoulder, not specified as traumatic: Secondary | ICD-10-CM | POA: Diagnosis not present

## 2017-02-11 DIAGNOSIS — M25611 Stiffness of right shoulder, not elsewhere classified: Secondary | ICD-10-CM | POA: Diagnosis not present

## 2017-02-11 DIAGNOSIS — M25511 Pain in right shoulder: Secondary | ICD-10-CM | POA: Diagnosis not present

## 2017-02-11 DIAGNOSIS — C44222 Squamous cell carcinoma of skin of right ear and external auricular canal: Secondary | ICD-10-CM | POA: Diagnosis not present

## 2017-02-12 DIAGNOSIS — M25611 Stiffness of right shoulder, not elsewhere classified: Secondary | ICD-10-CM | POA: Diagnosis not present

## 2017-02-12 DIAGNOSIS — M75121 Complete rotator cuff tear or rupture of right shoulder, not specified as traumatic: Secondary | ICD-10-CM | POA: Diagnosis not present

## 2017-02-12 DIAGNOSIS — M25511 Pain in right shoulder: Secondary | ICD-10-CM | POA: Diagnosis not present

## 2017-02-15 ENCOUNTER — Telehealth: Payer: Self-pay | Admitting: Internal Medicine

## 2017-02-15 NOTE — Telephone Encounter (Signed)
RN called as a FYI that they are faxing ned clarificaiton for  Rx, they received a hard copy of a Hydrocodone prescription from this office,  Please call back the patient is not currently on this med

## 2017-02-15 NOTE — Telephone Encounter (Signed)
Per Dr. Alain Marion, pt can fill Tylenol #4

## 2017-02-15 NOTE — Telephone Encounter (Signed)
Calling to verify Rx- Pharmacy is calling to verify narcotic Rx before filling. Please contact them.

## 2017-02-21 DIAGNOSIS — M25511 Pain in right shoulder: Secondary | ICD-10-CM | POA: Diagnosis not present

## 2017-02-21 DIAGNOSIS — M75121 Complete rotator cuff tear or rupture of right shoulder, not specified as traumatic: Secondary | ICD-10-CM | POA: Diagnosis not present

## 2017-02-21 DIAGNOSIS — M25611 Stiffness of right shoulder, not elsewhere classified: Secondary | ICD-10-CM | POA: Diagnosis not present

## 2017-02-25 DIAGNOSIS — M25511 Pain in right shoulder: Secondary | ICD-10-CM | POA: Diagnosis not present

## 2017-02-25 DIAGNOSIS — M75121 Complete rotator cuff tear or rupture of right shoulder, not specified as traumatic: Secondary | ICD-10-CM | POA: Diagnosis not present

## 2017-02-25 DIAGNOSIS — M25611 Stiffness of right shoulder, not elsewhere classified: Secondary | ICD-10-CM | POA: Diagnosis not present

## 2017-03-06 ENCOUNTER — Telehealth: Payer: Self-pay

## 2017-03-06 NOTE — Telephone Encounter (Signed)
Key: [id number]

## 2017-03-07 DIAGNOSIS — M25611 Stiffness of right shoulder, not elsewhere classified: Secondary | ICD-10-CM | POA: Diagnosis not present

## 2017-03-07 DIAGNOSIS — M75121 Complete rotator cuff tear or rupture of right shoulder, not specified as traumatic: Secondary | ICD-10-CM | POA: Diagnosis not present

## 2017-03-07 DIAGNOSIS — M25511 Pain in right shoulder: Secondary | ICD-10-CM | POA: Diagnosis not present

## 2017-03-14 DIAGNOSIS — M75121 Complete rotator cuff tear or rupture of right shoulder, not specified as traumatic: Secondary | ICD-10-CM | POA: Diagnosis not present

## 2017-03-14 DIAGNOSIS — M25511 Pain in right shoulder: Secondary | ICD-10-CM | POA: Diagnosis not present

## 2017-03-14 DIAGNOSIS — M25611 Stiffness of right shoulder, not elsewhere classified: Secondary | ICD-10-CM | POA: Diagnosis not present

## 2017-03-18 DIAGNOSIS — M25511 Pain in right shoulder: Secondary | ICD-10-CM | POA: Diagnosis not present

## 2017-03-18 DIAGNOSIS — M25611 Stiffness of right shoulder, not elsewhere classified: Secondary | ICD-10-CM | POA: Diagnosis not present

## 2017-03-18 DIAGNOSIS — M75121 Complete rotator cuff tear or rupture of right shoulder, not specified as traumatic: Secondary | ICD-10-CM | POA: Diagnosis not present

## 2017-03-19 ENCOUNTER — Encounter: Payer: Self-pay | Admitting: Internal Medicine

## 2017-03-20 NOTE — Telephone Encounter (Signed)
Patients [name] was denied, please advise

## 2017-03-22 ENCOUNTER — Other Ambulatory Visit: Payer: Self-pay | Admitting: Internal Medicine

## 2017-03-25 ENCOUNTER — Other Ambulatory Visit: Payer: Self-pay | Admitting: Internal Medicine

## 2017-03-25 DIAGNOSIS — M75121 Complete rotator cuff tear or rupture of right shoulder, not specified as traumatic: Secondary | ICD-10-CM | POA: Diagnosis not present

## 2017-03-25 DIAGNOSIS — M25511 Pain in right shoulder: Secondary | ICD-10-CM | POA: Diagnosis not present

## 2017-03-25 DIAGNOSIS — M25611 Stiffness of right shoulder, not elsewhere classified: Secondary | ICD-10-CM | POA: Diagnosis not present

## 2017-03-26 ENCOUNTER — Other Ambulatory Visit: Payer: Self-pay | Admitting: Internal Medicine

## 2017-03-26 MED ORDER — ACETAMINOPHEN-CODEINE #4 300-60 MG PO TABS: 1.0000 | ORAL_TABLET | Freq: Four times a day (QID) | ORAL | 0 refills | Status: DC | PRN

## 2017-03-28 DIAGNOSIS — M25511 Pain in right shoulder: Secondary | ICD-10-CM | POA: Diagnosis not present

## 2017-03-28 DIAGNOSIS — M75121 Complete rotator cuff tear or rupture of right shoulder, not specified as traumatic: Secondary | ICD-10-CM | POA: Diagnosis not present

## 2017-03-28 DIAGNOSIS — M25611 Stiffness of right shoulder, not elsewhere classified: Secondary | ICD-10-CM | POA: Diagnosis not present

## 2017-03-29 NOTE — Telephone Encounter (Signed)
PA denied.

## 2017-04-01 DIAGNOSIS — M25511 Pain in right shoulder: Secondary | ICD-10-CM | POA: Diagnosis not present

## 2017-04-01 DIAGNOSIS — M25611 Stiffness of right shoulder, not elsewhere classified: Secondary | ICD-10-CM | POA: Diagnosis not present

## 2017-04-01 DIAGNOSIS — M75121 Complete rotator cuff tear or rupture of right shoulder, not specified as traumatic: Secondary | ICD-10-CM | POA: Diagnosis not present

## 2017-04-02 ENCOUNTER — Encounter: Payer: Self-pay | Admitting: Internal Medicine

## 2017-04-02 ENCOUNTER — Ambulatory Visit: Payer: 59 | Admitting: Internal Medicine

## 2017-04-02 VITALS — BP 142/80 | HR 74 | Temp 98.5°F | Wt 210.0 lb

## 2017-04-02 DIAGNOSIS — F32A Depression, unspecified: Secondary | ICD-10-CM

## 2017-04-02 DIAGNOSIS — Z23 Encounter for immunization: Secondary | ICD-10-CM

## 2017-04-02 DIAGNOSIS — M7550 Bursitis of unspecified shoulder: Secondary | ICD-10-CM

## 2017-04-02 DIAGNOSIS — K2100 Gastro-esophageal reflux disease with esophagitis, without bleeding: Secondary | ICD-10-CM

## 2017-04-02 DIAGNOSIS — I10 Essential (primary) hypertension: Secondary | ICD-10-CM | POA: Diagnosis not present

## 2017-04-02 DIAGNOSIS — G4700 Insomnia, unspecified: Secondary | ICD-10-CM | POA: Diagnosis not present

## 2017-04-02 DIAGNOSIS — F329 Major depressive disorder, single episode, unspecified: Secondary | ICD-10-CM

## 2017-04-02 MED ORDER — AMLODIPINE-OLMESARTAN 5-40 MG PO TABS: 1.0000 | ORAL_TABLET | Freq: Every day | ORAL | 3 refills | Status: DC

## 2017-04-02 MED ORDER — PANTOPRAZOLE SODIUM 40 MG PO TBEC: 40.0000 mg | DELAYED_RELEASE_TABLET | Freq: Every day | ORAL | 3 refills | Status: DC

## 2017-04-02 MED ORDER — FENOFIBRATE 48 MG PO TABS: 48.0000 mg | ORAL_TABLET | Freq: Every day | ORAL | 3 refills | Status: DC

## 2017-04-02 MED ORDER — ACETAMINOPHEN-CODEINE #4 300-60 MG PO TABS: 1.0000 | ORAL_TABLET | Freq: Four times a day (QID) | ORAL | 2 refills | Status: DC | PRN

## 2017-04-02 NOTE — Addendum Note (Signed)
Addended by: Karren Cobble on: 04/02/2017 01:25 PM   Modules accepted: Orders

## 2017-04-02 NOTE — Assessment & Plan Note (Signed)
Wellbutrin

## 2017-04-02 NOTE — Assessment & Plan Note (Signed)
S/p surgery - recovering

## 2017-04-02 NOTE — Assessment & Plan Note (Signed)
Zolpidem

## 2017-04-02 NOTE — Assessment & Plan Note (Signed)
On Prilosec ?Potential benefits of a long term PPI use as well as potential risks  and complications were explained to the patient and were aknowledged. ?

## 2017-04-02 NOTE — Assessment & Plan Note (Signed)
[name] 

## 2017-04-02 NOTE — Progress Notes (Signed)
Subjective:  Patient ID: Paul Baron., male    DOB: [birth year]  Age: 72 y.o. MRN: [id number]  CC: No chief complaint on file.   HPI Paul [name] [employer]. presents for chronic pain, depressing, GERD f/u  Outpatient Medications Prior to Visit  Medication Sig Dispense Refill  . acetaminophen-codeine (TYLENOL #4) 300-60 MG tablet TAKE 1 TABLET BY MOUTH EVERY 6 HOURS AS NEEDED FOR PAIN 120 tablet 0  . acetaminophen-codeine (TYLENOL #4) 300-60 MG tablet Take 1 tablet by mouth every 6 (six) hours as needed for moderate pain. 120 tablet 0  . amLODipine-olmesartan ([name]) 5-40 MG tablet Take 1 tablet by mouth daily. 90 tablet 3  . atorvastatin (LIPITOR) 10 MG tablet Take 1 tablet (10 mg total) by mouth daily. Annual appt due in March w/labs must see provider for future refills 90 tablet 0  . buPROPion (WELLBUTRIN XL) 300 MG 24 hr tablet take 1 tablet by mouth once daily 90 tablet 3  . Cholecalciferol 1000 UNITS tablet Take 1,000 Units by mouth daily.      . Diclofenac Sodium (PENNSAID) 2 % SOLN Place 2 application onto the skin 2 (two) times daily. 112 g 3  . fenofibrate (TRICOR) 48 MG tablet Take 1 tablet (48 mg total) by mouth daily. 30 tablet 11  . finasteride (PROSCAR) 5 MG tablet Take 1 tablet (5 mg total) by mouth daily. 90 tablet 3  . loratadine (CLARITIN) 10 MG tablet take 1 tablet by mouth once daily (Patient taking differently: take 1 tablet by mouth once daily as needed) 100 tablet 3  . pantoprazole (PROTONIX) 40 MG tablet Take 1 tablet by mouth twice daily. 60 tablet 2  . zolpidem (AMBIEN) 10 MG tablet take 1/2 to 1 tablet by mouth at bedtime if needed 30 tablet 3   No facility-administered medications prior to visit.     ROS Review of Systems  Constitutional: Negative for appetite change, fatigue and unexpected weight change.  HENT: Negative for congestion, nosebleeds, sneezing, sore throat and trouble swallowing.   Eyes: Negative for itching and visual disturbance.    Respiratory: Negative for cough.   Cardiovascular: Negative for chest pain, palpitations and leg swelling.  Gastrointestinal: Negative for abdominal distention, blood in stool, diarrhea and nausea.  Genitourinary: Negative for frequency and hematuria.  Musculoskeletal: Negative for back pain, gait problem, joint swelling and neck pain.  Skin: Negative for rash.  Neurological: Negative for dizziness, tremors, speech difficulty and weakness.  Psychiatric/Behavioral: Negative for agitation, dysphoric mood, sleep disturbance and suicidal ideas. The patient is not nervous/anxious.     Objective:  BP (!) 142/80   Pulse 74   Temp 98.5 F (36.9 C)   Wt 210 lb (95.3 kg)   SpO2 98%   BMI 28.48 kg/m   BP Readings from Last 3 Encounters:  04/02/17 (!) 142/80  12/13/16 (!) 142/70  12/05/16 128/74    Wt Readings from Last 3 Encounters:  04/02/17 210 lb (95.3 kg)  12/13/16 210 lb (95.3 kg)  12/05/16 207 lb (93.9 kg)    Physical Exam  Constitutional: He is oriented to person, place, and time. He appears well-developed. No distress.  NAD  HENT:  Mouth/Throat: Oropharynx is clear and moist.  Eyes: Conjunctivae are normal. Pupils are equal, round, and reactive to light.  Neck: Normal range of motion. No JVD present. No thyromegaly present.  Cardiovascular: Normal rate, regular rhythm, normal heart sounds and intact distal pulses. Exam reveals no gallop and no friction rub.  No murmur heard. Pulmonary/Chest: Effort normal and breath sounds normal. No respiratory distress. He has no wheezes. He has no rales. He exhibits no tenderness.  Abdominal: Soft. Bowel sounds are normal. He exhibits no distension and no mass. There is no tenderness. There is no rebound and no guarding.  Musculoskeletal: Normal range of motion. He exhibits no edema or tenderness.  Lymphadenopathy:    He has no cervical adenopathy.  Neurological: He is alert and oriented to person, place, and time. He has normal  reflexes. No cranial nerve deficit. He exhibits normal muscle tone. He displays a negative Romberg sign. Coordination and gait normal.  Skin: Skin is warm and dry. No rash noted.  Psychiatric: He has a normal mood and affect. His behavior is normal. Judgment and thought content normal.    Lab Results  Component Value Date   WBC 6.7 05/30/2016   HGB 14.6 05/30/2016   HCT 42.4 05/30/2016   PLT 276.0 05/30/2016   GLUCOSE 112 (H) 09/05/2016   CHOL 135 09/05/2016   TRIG 220.0 (H) 09/05/2016   HDL 33.00 (L) 09/05/2016   LDLDIRECT 67.0 09/05/2016   LDLCALC 98 01/04/2014   ALT 19 09/05/2016   AST 31 09/05/2016   NA 139 09/05/2016   K 4.5 09/05/2016   CL 106 09/05/2016   CREATININE 1.12 09/05/2016   BUN 26 (H) 09/05/2016   CO2 29 09/05/2016   TSH 3.60 05/30/2016   PSA 0.20 05/30/2016    Mr Shoulder Right W Contrast  Result Date: 12/24/2016 CLINICAL DATA:  Chronic progressive right shoulder pain. EXAM: MR ARTHROGRAM OF THE RIGHT SHOULDER TECHNIQUE: Multiplanar, multisequence MR imaging of the right shoulder was performed following the administration of intra-articular contrast. CONTRAST:  See Injection Documentation. COMPARISON:  Ultrasound images dated 12/05/2016 FINDINGS: Rotator cuff: There is a 1 cm diameter full-thickness tear of the distal supraspinous tendon. The tendon is markedly attenuated. The tear extends as an articular surface rim rent tear into the anterior fibers of the distal infraspinatus tendon with a fairly extensive intrasubstance tear component best seen on image 13 of series 10. There is an extensive articular surface and intrasubstance tear of the distal subscapularis tendon. This is best seen on images 19 and 20 of series 8 and on image 17 of series 9. Teres minor tendon is intact. Muscles: No significant atrophy or edema in the muscles of the rotator cuff. Biceps long head: The degenerated long head of the biceps tendon is subluxed into the torn subscapularis tendon,  best seen on images 18 and 19 of series 8. Acromioclavicular Joint: Moderate AC joint arthropathy. Type 1 acromion. Glenohumeral Joint: No appreciable chondral defects. Labrum: Multiple tears of the labrum including a SLAP tear, a tear of the anterior inferior aspect of the labrum, and a tear of the posterior inferior labrum. The tears are almost circumferential. Bones: No significant abnormality. IMPRESSION: 1. 1 cm diameter full-thickness tear of the distal supraspinatus tendon. The tendon is markedly attenuated. 2. Rim rent articular surface tear and intrasubstance tear of the distal infraspinatus tendon. 3. Articular surface and intrasubstance tears of the subscapularis tendon. 4. Subluxation of the degenerated long head of the biceps tendon into the torn subscapularis tendon. 5. Moderate AC joint arthropathy. 6. Extensive labral tears. Electronically Signed   By: Lorriane Shire M.D.   On: 12/24/2016 17:26   Dg Fluoro Guided Needle Plc Aspiration/injection Loc  Result Date: 12/24/2016 CLINICAL DATA:  Right shoulder pain. EXAM: RIGHT SHOULDER INJECTION UNDER FLUOROSCOPY TECHNIQUE: An appropriate skin  entrance site was determined. The site was marked, prepped with Betadine, draped in the usual sterile fashion, and infiltrated locally with buffered Lidocaine. A 22 gauge spinal needle was advanced to the superomedial margin of the humeral head under intermittent fluoroscopy. 1 mL of 1% lidocaine injected easily. A mixture of 0.05 mL of MultiHance, 5 mL of 1% lidocaine, and 15 mL of Isovue-M 200 was then used to opacify the right shoulder capsule. 14 mL of this mixture were injected. No immediate complication. FLUOROSCOPY TIME:  Fluoroscopy Time:  2 seconds Radiation Exposure Index (if provided by the fluoroscopic device): 5.68 microGray*m^2 Number of Acquired Spot Images: 0 IMPRESSION: Technically successful right shoulder injection for MRI. Electronically Signed   By: Logan Bores M.D.   On: 12/24/2016 16:51      Assessment & Plan:   There are no diagnoses linked to this encounter. I am having Paul Harness. [place] Jr. maintain his Cholecalciferol, loratadine, fenofibrate, buPROPion, finasteride, Diclofenac Sodium, amLODipine-olmesartan, pantoprazole, zolpidem, atorvastatin, acetaminophen-codeine, and acetaminophen-codeine.  No orders of the defined types were placed in this encounter.    Follow-up: No Follow-up on file.  Walker Kehr, MD

## 2017-04-04 ENCOUNTER — Telehealth: Payer: Self-pay

## 2017-04-04 DIAGNOSIS — M25611 Stiffness of right shoulder, not elsewhere classified: Secondary | ICD-10-CM | POA: Diagnosis not present

## 2017-04-04 DIAGNOSIS — M75121 Complete rotator cuff tear or rupture of right shoulder, not specified as traumatic: Secondary | ICD-10-CM | POA: Diagnosis not present

## 2017-04-04 DIAGNOSIS — M25511 Pain in right shoulder: Secondary | ICD-10-CM | POA: Diagnosis not present

## 2017-04-04 NOTE — Telephone Encounter (Signed)
(  Routing to [employer]) Started authorization today and medication Warehouse manager - PA Case ID: [id number] - Rx #: [id number]

## 2017-04-04 NOTE — Telephone Encounter (Signed)
Message sent to [facility] that request has been approved.

## 2017-04-08 DIAGNOSIS — M25611 Stiffness of right shoulder, not elsewhere classified: Secondary | ICD-10-CM | POA: Diagnosis not present

## 2017-04-08 DIAGNOSIS — M25511 Pain in right shoulder: Secondary | ICD-10-CM | POA: Diagnosis not present

## 2017-04-08 DIAGNOSIS — M75121 Complete rotator cuff tear or rupture of right shoulder, not specified as traumatic: Secondary | ICD-10-CM | POA: Diagnosis not present

## 2017-04-21 ENCOUNTER — Other Ambulatory Visit: Payer: Self-pay | Admitting: Internal Medicine

## 2017-04-25 DIAGNOSIS — M25511 Pain in right shoulder: Secondary | ICD-10-CM | POA: Diagnosis not present

## 2017-04-25 DIAGNOSIS — M25611 Stiffness of right shoulder, not elsewhere classified: Secondary | ICD-10-CM | POA: Diagnosis not present

## 2017-04-25 DIAGNOSIS — M75121 Complete rotator cuff tear or rupture of right shoulder, not specified as traumatic: Secondary | ICD-10-CM | POA: Diagnosis not present

## 2017-05-02 DIAGNOSIS — L570 Actinic keratosis: Secondary | ICD-10-CM | POA: Diagnosis not present

## 2017-05-02 DIAGNOSIS — Z808 Family history of malignant neoplasm of other organs or systems: Secondary | ICD-10-CM | POA: Diagnosis not present

## 2017-05-02 DIAGNOSIS — L910 Hypertrophic scar: Secondary | ICD-10-CM | POA: Diagnosis not present

## 2017-05-02 DIAGNOSIS — Z85828 Personal history of other malignant neoplasm of skin: Secondary | ICD-10-CM | POA: Diagnosis not present

## 2017-05-31 DIAGNOSIS — M25511 Pain in right shoulder: Secondary | ICD-10-CM | POA: Diagnosis not present

## 2017-06-06 ENCOUNTER — Telehealth: Payer: Self-pay

## 2017-06-06 NOTE — Telephone Encounter (Signed)
Please advise or does pt need appt?  Copied from [place] [id number]. Topic: Quick Communication - See Telephone Encounter >> Jun 05, 2017  3:27 PM Antonieta Iba C wrote: CRM for notification. See Telephone encounter for: 06/05/17.  Pt called in to be advised. Pt says that he has congestion, body ache, cough, sore throat and would like to know if provider would send him something in to pharmacy?    Pharmacy: [facility] on [address]   Cb; [phone]

## 2017-06-07 NOTE — Telephone Encounter (Signed)
Pls use OTC "cold "meds OV if sick - ?[facility] Thx

## 2017-06-07 NOTE — Telephone Encounter (Signed)
Pt.notified

## 2017-06-08 ENCOUNTER — Ambulatory Visit: Payer: 59 | Admitting: Family Medicine

## 2017-06-08 ENCOUNTER — Encounter: Payer: Self-pay | Admitting: Family Medicine

## 2017-06-08 VITALS — BP 172/90 | HR 74 | Temp 98.9°F | Wt 212.0 lb

## 2017-06-08 DIAGNOSIS — J069 Acute upper respiratory infection, unspecified: Secondary | ICD-10-CM | POA: Diagnosis not present

## 2017-06-08 DIAGNOSIS — I10 Essential (primary) hypertension: Secondary | ICD-10-CM | POA: Diagnosis not present

## 2017-06-08 DIAGNOSIS — J45909 Unspecified asthma, uncomplicated: Secondary | ICD-10-CM | POA: Insufficient documentation

## 2017-06-08 DIAGNOSIS — J4521 Mild intermittent asthma with (acute) exacerbation: Secondary | ICD-10-CM | POA: Diagnosis not present

## 2017-06-08 MED ORDER — CLARITHROMYCIN ER 500 MG PO TB24: 1000.0000 mg | ORAL_TABLET | Freq: Every day | ORAL | 0 refills | Status: AC

## 2017-06-08 MED ORDER — BENZONATATE 200 MG PO CAPS: 200.0000 mg | ORAL_CAPSULE | Freq: Two times a day (BID) | ORAL | 0 refills | Status: DC | PRN

## 2017-06-08 NOTE — Progress Notes (Signed)
Subjective:  Patient ID: Paul Baron., male    DOB: 1945/07/17  Age: 72 y.o. MRN: [id number]  CC: Cough (x1 wk); Generalized Body Aches; Hoarse; and Headache   HPI Paul Baron. presents for evaluation of 6 day ho myalgias, malaise, ha, pnd, cough with wheeze and clear phlegm. No fever or chills. Ho rad. Compliant with bp meds. Took theraflu.   Outpatient Medications Prior to Visit  Medication Sig Dispense Refill  . acetaminophen-codeine (TYLENOL #4) 300-60 MG tablet TAKE 1 TABLET BY MOUTH EVERY 6 HOURS AS NEEDED FOR PAIN 120 tablet 0  . acetaminophen-codeine (TYLENOL #4) 300-60 MG tablet Take 1 tablet by mouth every 6 (six) hours as needed for moderate pain. 120 tablet 2  . amLODipine-olmesartan ([name]) 5-40 MG tablet Take 1 tablet by mouth daily. 90 tablet 3  . atorvastatin (LIPITOR) 10 MG tablet Take 1 tablet (10 mg total) by mouth daily. Annual appt due in March w/labs must see provider for future refills 90 tablet 0  . buPROPion (WELLBUTRIN XL) 300 MG 24 hr tablet Take 1 tablet by mouth daily. 90 tablet 1  . Cholecalciferol 1000 UNITS tablet Take 1,000 Units by mouth daily.      . Diclofenac Sodium (PENNSAID) 2 % SOLN Place 2 application onto the skin 2 (two) times daily. 112 g 3  . fenofibrate (TRICOR) 48 MG tablet Take 1 tablet (48 mg total) by mouth daily. 90 tablet 3  . finasteride (PROSCAR) 5 MG tablet Take 1 tablet (5 mg total) by mouth daily. 90 tablet 3  . loratadine (CLARITIN) 10 MG tablet take 1 tablet by mouth once daily (Patient taking differently: take 1 tablet by mouth once daily as needed) 100 tablet 3  . pantoprazole (PROTONIX) 40 MG tablet Take 1 tablet (40 mg total) by mouth daily. 90 tablet 3  . zolpidem (AMBIEN) 10 MG tablet take 1/2 to 1 tablet by mouth at bedtime if needed 30 tablet 3   No facility-administered medications prior to visit.     ROS Review of Systems  Constitutional: Positive for fatigue. Negative for chills.  HENT: Positive for  congestion, postnasal drip and rhinorrhea.   Eyes: Negative for photophobia and visual disturbance.  Respiratory: Positive for cough and wheezing. Negative for chest tightness.   Cardiovascular: Negative.   Skin: Negative for rash and wound.  Neurological: Negative for headaches.  Hematological: Does not bruise/bleed easily.  Psychiatric/Behavioral: Negative.     Objective:  BP (!) 172/90 (BP Location: Left Arm, Patient Position: Sitting, Cuff Size: Normal)   Pulse 74   Temp 98.9 F (37.2 C) (Oral)   Wt 212 lb (96.2 kg)   SpO2 98%   BMI 28.75 kg/m   BP Readings from Last 3 Encounters:  06/08/17 (!) 172/90  04/02/17 (!) 142/80  12/13/16 (!) 142/70    Wt Readings from Last 3 Encounters:  06/08/17 212 lb (96.2 kg)  04/02/17 210 lb (95.3 kg)  12/13/16 210 lb (95.3 kg)    Physical Exam  Constitutional: He is oriented to person, place, and time. He appears well-developed and well-nourished. No distress.  HENT:  Head: Normocephalic and atraumatic.  Right Ear: External ear normal.  Left Ear: External ear normal.  Mouth/Throat: Oropharynx is clear and moist. No oropharyngeal exudate.  Eyes: Pupils are equal, round, and reactive to light. Right eye exhibits no discharge. No scleral icterus.  Neck: Neck supple. No JVD present. No tracheal deviation present. No thyromegaly present.  Cardiovascular: Normal rate, regular  rhythm and normal heart sounds.  Pulmonary/Chest: Effort normal. No respiratory distress. He has wheezes. He has no rales.  Abdominal: Bowel sounds are normal.  Lymphadenopathy:    He has no cervical adenopathy.  Neurological: He is alert and oriented to person, place, and time.  Skin: Skin is warm and dry. He is not diaphoretic.  Psychiatric: He has a normal mood and affect. His behavior is normal.    Lab Results  Component Value Date   WBC 6.7 05/30/2016   HGB 14.6 05/30/2016   HCT 42.4 05/30/2016   PLT 276.0 05/30/2016   GLUCOSE 112 (H) 09/05/2016    CHOL 135 09/05/2016   TRIG 220.0 (H) 09/05/2016   HDL 33.00 (L) 09/05/2016   LDLDIRECT 67.0 09/05/2016   LDLCALC 98 01/04/2014   ALT 19 09/05/2016   AST 31 09/05/2016   NA 139 09/05/2016   K 4.5 09/05/2016   CL 106 09/05/2016   CREATININE 1.12 09/05/2016   BUN 26 (H) 09/05/2016   CO2 29 09/05/2016   TSH 3.60 05/30/2016   PSA 0.20 05/30/2016    Mr Shoulder Right W Contrast  Result Date: 12/24/2016 CLINICAL DATA:  Chronic progressive right shoulder pain. EXAM: MR ARTHROGRAM OF THE RIGHT SHOULDER TECHNIQUE: Multiplanar, multisequence MR imaging of the right shoulder was performed following the administration of intra-articular contrast. CONTRAST:  See Injection Documentation. COMPARISON:  Ultrasound images dated 12/05/2016 FINDINGS: Rotator cuff: There is a 1 cm diameter full-thickness tear of the distal supraspinous tendon. The tendon is markedly attenuated. The tear extends as an articular surface rim rent tear into the anterior fibers of the distal infraspinatus tendon with a fairly extensive intrasubstance tear component best seen on image 13 of series 10. There is an extensive articular surface and intrasubstance tear of the distal subscapularis tendon. This is best seen on images 19 and 20 of series 8 and on image 17 of series 9. Teres minor tendon is intact. Muscles: No significant atrophy or edema in the muscles of the rotator cuff. Biceps long head: The degenerated long head of the biceps tendon is subluxed into the torn subscapularis tendon, best seen on images 18 and 19 of series 8. Acromioclavicular Joint: Moderate AC joint arthropathy. Type 1 acromion. Glenohumeral Joint: No appreciable chondral defects. Labrum: Multiple tears of the labrum including a SLAP tear, a tear of the anterior inferior aspect of the labrum, and a tear of the posterior inferior labrum. The tears are almost circumferential. Bones: No significant abnormality. IMPRESSION: 1. 1 cm diameter full-thickness tear of the  distal supraspinatus tendon. The tendon is markedly attenuated. 2. Rim rent articular surface tear and intrasubstance tear of the distal infraspinatus tendon. 3. Articular surface and intrasubstance tears of the subscapularis tendon. 4. Subluxation of the degenerated long head of the biceps tendon into the torn subscapularis tendon. 5. Moderate AC joint arthropathy. 6. Extensive labral tears. Electronically Signed   By: Lorriane Shire M.D.   On: 12/24/2016 17:26   Dg Fluoro Guided Needle Plc Aspiration/injection Loc  Result Date: 12/24/2016 CLINICAL DATA:  Right shoulder pain. EXAM: RIGHT SHOULDER INJECTION UNDER FLUOROSCOPY TECHNIQUE: An appropriate skin entrance site was determined. The site was marked, prepped with Betadine, draped in the usual sterile fashion, and infiltrated locally with buffered Lidocaine. A 22 gauge spinal needle was advanced to the superomedial margin of the humeral head under intermittent fluoroscopy. 1 mL of 1% lidocaine injected easily. A mixture of 0.05 mL of MultiHance, 5 mL of 1% lidocaine, and 15 mL of  Isovue-M 200 was then used to opacify the right shoulder capsule. 14 mL of this mixture were injected. No immediate complication. FLUOROSCOPY TIME:  Fluoroscopy Time:  2 seconds Radiation Exposure Index (if provided by the fluoroscopic device): 5.68 microGray*m^2 Number of Acquired Spot Images: 0 IMPRESSION: Technically successful right shoulder injection for MRI. Electronically Signed   By: Logan Bores M.D.   On: 12/24/2016 16:51    Assessment & Plan:   Kaydence was seen today for cough, generalized body aches, hoarse and headache.  Diagnoses and all orders for this visit:  Essential hypertension  Upper respiratory tract infection, unspecified type  Mild intermittent reactive airway disease with acute exacerbation   I am having [name]. Alan Jr. maintain his Cholecalciferol, loratadine, finasteride, Diclofenac Sodium, zolpidem, atorvastatin,  acetaminophen-codeine, acetaminophen-codeine, amLODipine-olmesartan, fenofibrate, pantoprazole, and buPROPion.  No orders of the defined types were placed in this encounter.  Will usebiaxin for its anti inflammatory properties and tessalon. Appointment scheduled with primary md for bp fu.   Follow-up: No follow-ups on file.  Libby Maw, MD

## 2017-06-10 ENCOUNTER — Ambulatory Visit (INDEPENDENT_AMBULATORY_CARE_PROVIDER_SITE_OTHER)
Admission: RE | Admit: 2017-06-10 | Discharge: 2017-06-10 | Disposition: A | Discharge status: Home / Self Care | Payer: 59 | Source: Ambulatory Visit | Attending: Internal Medicine | Admitting: Internal Medicine

## 2017-06-10 ENCOUNTER — Encounter: Payer: Self-pay | Admitting: Internal Medicine

## 2017-06-10 ENCOUNTER — Ambulatory Visit: Payer: 59 | Admitting: Internal Medicine

## 2017-06-10 DIAGNOSIS — R0602 Shortness of breath: Secondary | ICD-10-CM | POA: Diagnosis not present

## 2017-06-10 DIAGNOSIS — J069 Acute upper respiratory infection, unspecified: Secondary | ICD-10-CM

## 2017-06-10 DIAGNOSIS — I10 Essential (primary) hypertension: Secondary | ICD-10-CM | POA: Diagnosis not present

## 2017-06-10 DIAGNOSIS — J4521 Mild intermittent asthma with (acute) exacerbation: Secondary | ICD-10-CM | POA: Diagnosis not present

## 2017-06-10 DIAGNOSIS — R05 Cough: Secondary | ICD-10-CM | POA: Diagnosis not present

## 2017-06-10 MED ORDER — TRIAMTERENE-HCTZ 37.5-25 MG PO TABS: 1.0000 | ORAL_TABLET | Freq: Every day | ORAL | 3 refills | Status: DC

## 2017-06-10 NOTE — Progress Notes (Signed)
Subjective:  Patient ID: Paul Baron., male    DOB: Feb 20, 1946  Age: 72 y.o. MRN: [id number]  CC: No chief complaint on file.   HPI Paul [name] [employer]. presents for URI x 1 week, HA, cough  Sat [facility] - on Biaxin and Tessalon now F/u BP - ?worse  Outpatient Medications Prior to Visit  Medication Sig Dispense Refill  . acetaminophen-codeine (TYLENOL #4) 300-60 MG tablet Take 1 tablet by mouth every 6 (six) hours as needed for moderate pain. 120 tablet 2  . amLODipine-olmesartan ([name]) 5-40 MG tablet Take 1 tablet by mouth daily. 90 tablet 3  . atorvastatin (LIPITOR) 10 MG tablet Take 1 tablet (10 mg total) by mouth daily. Annual appt due in March w/labs must see provider for future refills 90 tablet 0  . benzonatate (TESSALON) 200 MG capsule Take 1 capsule (200 mg total) by mouth 2 (two) times daily as needed for cough. 20 capsule 0  . buPROPion (WELLBUTRIN XL) 300 MG 24 hr tablet Take 1 tablet by mouth daily. 90 tablet 1  . Cholecalciferol 1000 UNITS tablet Take 1,000 Units by mouth daily.      . clarithromycin (BIAXIN XL) 500 MG 24 hr tablet Take 2 tablets (1,000 mg total) by mouth daily for 7 days. 14 tablet 0  . Diclofenac Sodium (PENNSAID) 2 % SOLN Place 2 application onto the skin 2 (two) times daily. 112 g 3  . fenofibrate (TRICOR) 48 MG tablet Take 1 tablet (48 mg total) by mouth daily. 90 tablet 3  . finasteride (PROSCAR) 5 MG tablet Take 1 tablet (5 mg total) by mouth daily. 90 tablet 3  . loratadine (CLARITIN) 10 MG tablet take 1 tablet by mouth once daily (Patient taking differently: take 1 tablet by mouth once daily as needed) 100 tablet 3  . pantoprazole (PROTONIX) 40 MG tablet Take 1 tablet (40 mg total) by mouth daily. 90 tablet 3  . zolpidem (AMBIEN) 10 MG tablet take 1/2 to 1 tablet by mouth at bedtime if needed 30 tablet 3  . acetaminophen-codeine (TYLENOL #4) 300-60 MG tablet TAKE 1 TABLET BY MOUTH EVERY 6 HOURS AS NEEDED FOR PAIN (Patient not taking: Reported  on 06/10/2017) 120 tablet 0   No facility-administered medications prior to visit.     ROS Review of Systems  Constitutional: Negative for appetite change, fatigue and unexpected weight change.  HENT: Positive for congestion, postnasal drip, rhinorrhea, sinus pressure and voice change. Negative for nosebleeds, sneezing, sore throat and trouble swallowing.   Eyes: Negative for itching and visual disturbance.  Respiratory: Positive for cough.   Cardiovascular: Negative for chest pain, palpitations and leg swelling.  Gastrointestinal: Negative for abdominal distention, blood in stool, diarrhea and nausea.  Genitourinary: Negative for frequency and hematuria.  Musculoskeletal: Negative for back pain, gait problem, joint swelling and neck pain.  Skin: Negative for rash.  Neurological: Negative for dizziness, tremors, speech difficulty and weakness.  Psychiatric/Behavioral: Negative for agitation, dysphoric mood and sleep disturbance. The patient is not nervous/anxious.     Objective:  BP (!) 152/78 (BP Location: Left Arm, Patient Position: Sitting, Cuff Size: Large)   Pulse 64   Temp 98.3 F (36.8 C) (Oral)   Ht 6' (1.829 m)   Wt 207 lb (93.9 kg)   SpO2 99%   BMI 28.07 kg/m   BP Readings from Last 3 Encounters:  06/10/17 (!) 152/78  06/08/17 (!) 172/90  04/02/17 (!) 142/80    Wt Readings from Last 3  Encounters:  06/10/17 207 lb (93.9 kg)  06/08/17 212 lb (96.2 kg)  04/02/17 210 lb (95.3 kg)    Physical Exam  Constitutional: He is oriented to person, place, and time. He appears well-developed. No distress.  NAD  HENT:  Mouth/Throat: Oropharynx is clear and moist.  Eyes: Pupils are equal, round, and reactive to light. Conjunctivae are normal.  Neck: Normal range of motion. No JVD present. No thyromegaly present.  Cardiovascular: Normal rate, regular rhythm, normal heart sounds and intact distal pulses. Exam reveals no gallop and no friction rub.  No murmur  heard. Pulmonary/Chest: Effort normal and breath sounds normal. No respiratory distress. He has no wheezes. He has no rales. He exhibits no tenderness.  Abdominal: Soft. Bowel sounds are normal. He exhibits no distension and no mass. There is no tenderness. There is no rebound and no guarding.  Musculoskeletal: Normal range of motion. He exhibits no edema or tenderness.  Lymphadenopathy:    He has no cervical adenopathy.  Neurological: He is alert and oriented to person, place, and time. He has normal reflexes. No cranial nerve deficit. He exhibits normal muscle tone. He displays a negative Romberg sign. Coordination and gait normal.  Skin: Skin is warm and dry. No rash noted.  Psychiatric: He has a normal mood and affect. His behavior is normal. Judgment and thought content normal.  eryth throat Mild rhonchi B  Lab Results  Component Value Date   WBC 6.7 05/30/2016   HGB 14.6 05/30/2016   HCT 42.4 05/30/2016   PLT 276.0 05/30/2016   GLUCOSE 112 (H) 09/05/2016   CHOL 135 09/05/2016   TRIG 220.0 (H) 09/05/2016   HDL 33.00 (L) 09/05/2016   LDLDIRECT 67.0 09/05/2016   LDLCALC 98 01/04/2014   ALT 19 09/05/2016   AST 31 09/05/2016   NA 139 09/05/2016   K 4.5 09/05/2016   CL 106 09/05/2016   CREATININE 1.12 09/05/2016   BUN 26 (H) 09/05/2016   CO2 29 09/05/2016   TSH 3.60 05/30/2016   PSA 0.20 05/30/2016    Mr Shoulder Right W Contrast  Result Date: 12/24/2016 CLINICAL DATA:  Chronic progressive right shoulder pain. EXAM: MR ARTHROGRAM OF THE RIGHT SHOULDER TECHNIQUE: Multiplanar, multisequence MR imaging of the right shoulder was performed following the administration of intra-articular contrast. CONTRAST:  See Injection Documentation. COMPARISON:  Ultrasound images dated 12/05/2016 FINDINGS: Rotator cuff: There is a 1 cm diameter full-thickness tear of the distal supraspinous tendon. The tendon is markedly attenuated. The tear extends as an articular surface rim rent tear into the  anterior fibers of the distal infraspinatus tendon with a fairly extensive intrasubstance tear component best seen on image 13 of series 10. There is an extensive articular surface and intrasubstance tear of the distal subscapularis tendon. This is best seen on images 19 and 20 of series 8 and on image 17 of series 9. Teres minor tendon is intact. Muscles: No significant atrophy or edema in the muscles of the rotator cuff. Biceps long head: The degenerated long head of the biceps tendon is subluxed into the torn subscapularis tendon, best seen on images 18 and 19 of series 8. Acromioclavicular Joint: Moderate AC joint arthropathy. Type 1 acromion. Glenohumeral Joint: No appreciable chondral defects. Labrum: Multiple tears of the labrum including a SLAP tear, a tear of the anterior inferior aspect of the labrum, and a tear of the posterior inferior labrum. The tears are almost circumferential. Bones: No significant abnormality. IMPRESSION: 1. 1 cm diameter full-thickness tear of  the distal supraspinatus tendon. The tendon is markedly attenuated. 2. Rim rent articular surface tear and intrasubstance tear of the distal infraspinatus tendon. 3. Articular surface and intrasubstance tears of the subscapularis tendon. 4. Subluxation of the degenerated long head of the biceps tendon into the torn subscapularis tendon. 5. Moderate AC joint arthropathy. 6. Extensive labral tears. Electronically Signed   By: Lorriane Shire M.D.   On: 12/24/2016 17:26   Dg Fluoro Guided Needle Plc Aspiration/injection Loc  Result Date: 12/24/2016 CLINICAL DATA:  Right shoulder pain. EXAM: RIGHT SHOULDER INJECTION UNDER FLUOROSCOPY TECHNIQUE: An appropriate skin entrance site was determined. The site was marked, prepped with Betadine, draped in the usual sterile fashion, and infiltrated locally with buffered Lidocaine. A 22 gauge spinal needle was advanced to the superomedial margin of the humeral head under intermittent fluoroscopy. 1 mL  of 1% lidocaine injected easily. A mixture of 0.05 mL of MultiHance, 5 mL of 1% lidocaine, and 15 mL of Isovue-M 200 was then used to opacify the right shoulder capsule. 14 mL of this mixture were injected. No immediate complication. FLUOROSCOPY TIME:  Fluoroscopy Time:  2 seconds Radiation Exposure Index (if provided by the fluoroscopic device): 5.68 microGray*m^2 Number of Acquired Spot Images: 0 IMPRESSION: Technically successful right shoulder injection for MRI. Electronically Signed   By: Logan Bores M.D.   On: 12/24/2016 16:51    Assessment & Plan:   There are no diagnoses linked to this encounter. I am having Paul Harness. Mcmillion Jr. maintain his Cholecalciferol, loratadine, finasteride, Diclofenac Sodium, zolpidem, atorvastatin, acetaminophen-codeine, amLODipine-olmesartan, fenofibrate, pantoprazole, buPROPion, clarithromycin, and benzonatate.  No orders of the defined types were placed in this encounter.    Follow-up: No follow-ups on file.  Walker Kehr, MD

## 2017-06-10 NOTE — Assessment & Plan Note (Signed)
[name] Add maxzide

## 2017-06-10 NOTE — Assessment & Plan Note (Signed)
On Biaxin and Tessalon - cont Rx CXR

## 2017-06-10 NOTE — Assessment & Plan Note (Signed)
CXR

## 2017-06-11 ENCOUNTER — Encounter: Payer: Self-pay | Admitting: Internal Medicine

## 2017-06-20 ENCOUNTER — Other Ambulatory Visit: Payer: Self-pay | Admitting: Internal Medicine

## 2017-06-24 ENCOUNTER — Other Ambulatory Visit: Payer: Self-pay | Admitting: Internal Medicine

## 2017-06-26 NOTE — Telephone Encounter (Signed)
Copied from [place] [id number]. Topic: Quick Communication - Rx Refill/Question >> Jun 26, 2017  5:31 PM Tye [place] wrote: Medication: zolpidem (AMBIEN) 10 MG tablet [[id number] Has the patient contacted their pharmacy? Yes.   (Agent: If no, request that the patient contact the pharmacy for the refill.) Preferred Pharmacy (with phone number or street name): [facility], [address] Agent: Please be advised that RX refills may take up to 3 business days. We ask that you follow-up with your pharmacy.

## 2017-06-27 ENCOUNTER — Encounter: Payer: Self-pay | Admitting: Internal Medicine

## 2017-06-27 ENCOUNTER — Telehealth: Payer: Self-pay

## 2017-06-27 NOTE — Telephone Encounter (Signed)
Ambien LOV: 04/02/17 PCP: Dr Alain Marion Pharmacy: [facility] [address]

## 2017-06-27 NOTE — Telephone Encounter (Signed)
Key: [id number] - PA Case ID: [id number] - Rx #: [id number]    Started PA request for AmLODIPine-Olmesartan via Cover My Meds.

## 2017-07-03 ENCOUNTER — Encounter: Payer: Self-pay | Admitting: Internal Medicine

## 2017-07-03 ENCOUNTER — Ambulatory Visit: Payer: 59 | Admitting: Internal Medicine

## 2017-07-03 DIAGNOSIS — J301 Allergic rhinitis due to pollen: Secondary | ICD-10-CM

## 2017-07-03 DIAGNOSIS — F411 Generalized anxiety disorder: Secondary | ICD-10-CM

## 2017-07-03 DIAGNOSIS — F32A Depression, unspecified: Secondary | ICD-10-CM

## 2017-07-03 DIAGNOSIS — M5441 Lumbago with sciatica, right side: Secondary | ICD-10-CM | POA: Diagnosis not present

## 2017-07-03 DIAGNOSIS — F329 Major depressive disorder, single episode, unspecified: Secondary | ICD-10-CM

## 2017-07-03 DIAGNOSIS — M5442 Lumbago with sciatica, left side: Secondary | ICD-10-CM | POA: Diagnosis not present

## 2017-07-03 DIAGNOSIS — G8929 Other chronic pain: Secondary | ICD-10-CM

## 2017-07-03 MED ORDER — DICLOFENAC SODIUM 2 % TD SOLN: 2.0000 "application " | Freq: Two times a day (BID) | TRANSDERMAL | 3 refills | Status: DC

## 2017-07-03 MED ORDER — ACETAMINOPHEN-CODEINE #4 300-60 MG PO TABS: 1.0000 | ORAL_TABLET | Freq: Four times a day (QID) | ORAL | 2 refills | Status: DC | PRN

## 2017-07-03 NOTE — Progress Notes (Signed)
Subjective:  Patient ID: Paul [name]., male    DOB: 1945/09/18  Age: 72 y.o. MRN: [id number]  CC: No chief complaint on file.   HPI Paul [name] [employer]. presents for chronic pain, HTN, dyslipidemia f/u  Outpatient Medications Prior to Visit  Medication Sig Dispense Refill  . acetaminophen-codeine (TYLENOL #4) 300-60 MG tablet Take 1 tablet by mouth every 6 (six) hours as needed for moderate pain. 120 tablet 2  . amLODipine-olmesartan ([name]) 5-40 MG tablet Take 1 tablet by mouth daily. 90 tablet 3  . atorvastatin (LIPITOR) 10 MG tablet Take 1 tablet (10 mg total) by mouth daily. Annual appt due in March w/labs must see provider for future refills 30 tablet 0  . buPROPion (WELLBUTRIN XL) 300 MG 24 hr tablet Take 1 tablet by mouth daily. 90 tablet 1  . Cholecalciferol 1000 UNITS tablet Take 1,000 Units by mouth daily.      . Diclofenac Sodium (PENNSAID) 2 % SOLN Place 2 application onto the skin 2 (two) times daily. 112 g 3  . fenofibrate (TRICOR) 48 MG tablet Take 1 tablet (48 mg total) by mouth daily. 90 tablet 3  . finasteride (PROSCAR) 5 MG tablet Take 1 tablet (5 mg total) by mouth daily. 90 tablet 3  . loratadine (CLARITIN) 10 MG tablet take 1 tablet by mouth once daily (Patient taking differently: take 1 tablet by mouth once daily as needed) 100 tablet 3  . pantoprazole (PROTONIX) 40 MG tablet Take 1 tablet (40 mg total) by mouth daily. 90 tablet 3  . triamterene-hydrochlorothiazide (MAXZIDE-25) 37.5-25 MG tablet Take 1 tablet by mouth daily. 90 tablet 3  . zolpidem (AMBIEN) 10 MG tablet TAKE 1/2 TO 1 TABLET BY MOUTH EVERY NIGHT AT BEDTIME AS NEEDED 30 tablet 3  . benzonatate (TESSALON) 200 MG capsule Take 1 capsule (200 mg total) by mouth 2 (two) times daily as needed for cough. 20 capsule 0   No facility-administered medications prior to visit.     ROS Review of Systems  Constitutional: Negative for appetite change, fatigue and unexpected weight change.  HENT: Negative  for congestion, nosebleeds, sneezing, sore throat and trouble swallowing.   Eyes: Negative for itching and visual disturbance.  Respiratory: Negative for cough.   Cardiovascular: Negative for chest pain, palpitations and leg swelling.  Gastrointestinal: Negative for abdominal distention, blood in stool, diarrhea and nausea.  Genitourinary: Negative for frequency and hematuria.  Musculoskeletal: Positive for arthralgias and back pain. Negative for gait problem, joint swelling and neck pain.  Skin: Negative for rash.  Neurological: Negative for dizziness, tremors, speech difficulty and weakness.  Psychiatric/Behavioral: Negative for agitation, dysphoric mood and sleep disturbance. The patient is not nervous/anxious.     Objective:  BP (!) 154/78 (BP Location: Left Arm, Patient Position: Sitting, Cuff Size: Large)   Pulse 64   Temp 98.1 F (36.7 C) (Oral)   Ht 6' (1.829 m)   Wt 210 lb (95.3 kg)   SpO2 99%   BMI 28.48 kg/m   BP Readings from Last 3 Encounters:  07/03/17 (!) 154/78  06/10/17 (!) 152/78  06/08/17 (!) 172/90    Wt Readings from Last 3 Encounters:  07/03/17 210 lb (95.3 kg)  06/10/17 207 lb (93.9 kg)  06/08/17 212 lb (96.2 kg)    Physical Exam  Constitutional: He is oriented to person, place, and time. He appears well-developed. No distress.  NAD  HENT:  Mouth/Throat: Oropharynx is clear and moist.  Eyes: Pupils are equal, round,  and reactive to light. Conjunctivae are normal.  Neck: Normal range of motion. No JVD present. No thyromegaly present.  Cardiovascular: Normal rate, regular rhythm, normal heart sounds and intact distal pulses. Exam reveals no gallop and no friction rub.  No murmur heard. Pulmonary/Chest: Effort normal and breath sounds normal. No respiratory distress. He has no wheezes. He has no rales. He exhibits no tenderness.  Abdominal: Soft. Bowel sounds are normal. He exhibits no distension and no mass. There is no tenderness. There is no rebound  and no guarding.  Musculoskeletal: Normal range of motion. He exhibits tenderness. He exhibits no edema.  Lymphadenopathy:    He has no cervical adenopathy.  Neurological: He is alert and oriented to person, place, and time. He has normal reflexes. No cranial nerve deficit. He exhibits normal muscle tone. He displays a negative Romberg sign. Coordination and gait normal.  Skin: Skin is warm and dry. No rash noted.  Psychiatric: He has a normal mood and affect. His behavior is normal. Judgment and thought content normal.    Lab Results  Component Value Date   WBC 6.7 05/30/2016   HGB 14.6 05/30/2016   HCT 42.4 05/30/2016   PLT 276.0 05/30/2016   GLUCOSE 112 (H) 09/05/2016   CHOL 135 09/05/2016   TRIG 220.0 (H) 09/05/2016   HDL 33.00 (L) 09/05/2016   LDLDIRECT 67.0 09/05/2016   LDLCALC 98 01/04/2014   ALT 19 09/05/2016   AST 31 09/05/2016   NA 139 09/05/2016   K 4.5 09/05/2016   CL 106 09/05/2016   CREATININE 1.12 09/05/2016   BUN 26 (H) 09/05/2016   CO2 29 09/05/2016   TSH 3.60 05/30/2016   PSA 0.20 05/30/2016    Dg Chest 2 View  Result Date: 06/10/2017 CLINICAL DATA:  Cough, congestion, shortness of breath and wheezing for 7 days. EXAM: CHEST - 2 VIEW COMPARISON:  02/11/2014 FINDINGS: Cardiac silhouette is normal in size. No mediastinal or hilar masses. No evidence of adenopathy. Clear lungs.  No pleural effusion or pneumothorax. Status post anterior cervical spine fusion. Skeletal structures are intact. IMPRESSION: No active cardiopulmonary disease. Electronically Signed   By: Lajean Manes M.D.   On: 06/10/2017 11:12    Assessment & Plan:   There are no diagnoses linked to this encounter. I have discontinued [name]. [name]'s benzonatate. I am also having him maintain his Cholecalciferol, loratadine, finasteride, Diclofenac Sodium, acetaminophen-codeine, amLODipine-olmesartan, fenofibrate, pantoprazole, buPROPion, triamterene-hydrochlorothiazide, atorvastatin, and  zolpidem.  No orders of the defined types were placed in this encounter.    Follow-up: No follow-ups on file.  Walker Kehr, MD

## 2017-07-03 NOTE — Assessment & Plan Note (Signed)
Claritin

## 2017-07-03 NOTE — Assessment & Plan Note (Signed)
T#4  Potential benefits of a long term opioids use as well as potential risks (i.e. addiction risk, apnea etc) and complications (i.e. Somnolence, constipation and others) were explained to the patient and were aknowledged.  

## 2017-07-03 NOTE — Assessment & Plan Note (Signed)
On Wellbutrin

## 2017-07-03 NOTE — Assessment & Plan Note (Signed)
wellbutrin XL 

## 2017-07-04 ENCOUNTER — Telehealth: Payer: Self-pay

## 2017-07-04 NOTE — Telephone Encounter (Signed)
LMTCB, rec'd fax stating pennsaid is not covered by insurance but insurance prefers volteren gel. Advised pt to call back with what he would like to do.

## 2017-07-12 MED ORDER — DICLOFENAC SODIUM 2 % TD SOLN: 2.0000 "application " | Freq: Two times a day (BID) | TRANSDERMAL | 3 refills | Status: DC

## 2017-07-12 NOTE — Telephone Encounter (Signed)
RX resent

## 2017-07-12 NOTE — Telephone Encounter (Signed)
Pt is returning call [phone] It is ok to fill the generic  He would like this to go to [facility] on [address]

## 2017-07-20 ENCOUNTER — Other Ambulatory Visit: Payer: Self-pay | Admitting: Internal Medicine

## 2017-07-25 DIAGNOSIS — L570 Actinic keratosis: Secondary | ICD-10-CM | POA: Diagnosis not present

## 2017-07-25 DIAGNOSIS — L821 Other seborrheic keratosis: Secondary | ICD-10-CM | POA: Diagnosis not present

## 2017-07-25 DIAGNOSIS — S40862A Insect bite (nonvenomous) of left upper arm, initial encounter: Secondary | ICD-10-CM | POA: Diagnosis not present

## 2017-07-25 DIAGNOSIS — L986 Other infiltrative disorders of the skin and subcutaneous tissue: Secondary | ICD-10-CM | POA: Diagnosis not present

## 2017-07-25 DIAGNOSIS — D485 Neoplasm of uncertain behavior of skin: Secondary | ICD-10-CM | POA: Diagnosis not present

## 2017-07-30 NOTE — Telephone Encounter (Addendum)
PA denied.

## 2017-08-06 ENCOUNTER — Ambulatory Visit: Payer: Self-pay

## 2017-08-06 ENCOUNTER — Ambulatory Visit: Payer: 59 | Admitting: Family Medicine

## 2017-08-06 ENCOUNTER — Encounter: Payer: Self-pay | Admitting: Family Medicine

## 2017-08-06 VITALS — BP 141/76 | HR 83 | Temp 98.1°F | Ht 72.0 in | Wt 208.0 lb

## 2017-08-06 DIAGNOSIS — M25512 Pain in left shoulder: Secondary | ICD-10-CM

## 2017-08-06 DIAGNOSIS — M898X1 Other specified disorders of bone, shoulder: Secondary | ICD-10-CM | POA: Insufficient documentation

## 2017-08-06 MED ORDER — PREDNISONE 5 MG PO TABS: ORAL_TABLET | ORAL | 0 refills | Status: DC

## 2017-08-06 NOTE — Patient Instructions (Signed)
Good to see you  Please let me know how the prednisone does  If it doesn't improve your pain then we can try gabapentin.  Please follow up with me in 3 weeks if your pain hasn't improved.

## 2017-08-06 NOTE — Assessment & Plan Note (Signed)
The pain sounds nerve related. The shoulder is strong and no radicular symptoms. Scapular moves well. History of fusion on multilevel levels  - prednisone  - if no improvement consider gabapentin vs trigger point injection
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
## 2017-08-07 ENCOUNTER — Encounter: Payer: Self-pay | Admitting: Family Medicine

## 2017-08-07 ENCOUNTER — Ambulatory Visit: Payer: 59 | Admitting: Family

## 2017-08-08 ENCOUNTER — Encounter: Payer: Self-pay | Admitting: Family Medicine

## 2017-08-09 MED ORDER — PREDNISONE 50 MG PO TABS: 50.0000 mg | ORAL_TABLET | Freq: Every day | ORAL | 0 refills | Status: DC

## 2017-08-12 ENCOUNTER — Encounter: Payer: Self-pay | Admitting: Family Medicine

## 2017-08-13 ENCOUNTER — Encounter: Payer: Self-pay | Admitting: Family Medicine
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
## 2017-08-16 ENCOUNTER — Encounter: Payer: Self-pay | Admitting: Family Medicine

## 2017-08-16 ENCOUNTER — Ambulatory Visit (INDEPENDENT_AMBULATORY_CARE_PROVIDER_SITE_OTHER)
Admission: RE | Admit: 2017-08-16 | Discharge: 2017-08-16 | Disposition: A | Discharge status: Home / Self Care | Payer: 59 | Source: Ambulatory Visit | Attending: Family Medicine | Admitting: Family Medicine

## 2017-08-16 ENCOUNTER — Ambulatory Visit: Payer: Self-pay

## 2017-08-16 ENCOUNTER — Ambulatory Visit: Payer: 59 | Admitting: Family Medicine

## 2017-08-16 VITALS — BP 142/72 | HR 76 | Ht 72.0 in | Wt 207.0 lb

## 2017-08-16 DIAGNOSIS — M25512 Pain in left shoulder: Secondary | ICD-10-CM

## 2017-08-16 DIAGNOSIS — M542 Cervicalgia: Secondary | ICD-10-CM

## 2017-08-16 DIAGNOSIS — G8929 Other chronic pain: Secondary | ICD-10-CM

## 2017-08-16 DIAGNOSIS — M47812 Spondylosis without myelopathy or radiculopathy, cervical region: Secondary | ICD-10-CM | POA: Diagnosis not present

## 2017-08-16 MED ORDER — HYDROXYZINE HCL 10 MG PO TABS: 10.0000 mg | ORAL_TABLET | Freq: Three times a day (TID) | ORAL | 0 refills | Status: DC | PRN

## 2017-08-16 MED ORDER — GABAPENTIN 100 MG PO CAPS: 200.0000 mg | ORAL_CAPSULE | Freq: Every day | ORAL | 3 refills | Status: DC

## 2017-08-16 NOTE — Patient Instructions (Signed)
Good to see you  Ice 20 minutes 2 times daily. Usually after activity and before bed. Gabapentin 200mg  a night When feeling stress likely hydroxyzine 10 mg up to 3 times a day first time though take it at home.  Exercises 3 times a week.  Keep hands within peripheral vision  Trigger point injections given today  Xray of neck downstairs See me again in 2-3 weeks

## 2017-08-16 NOTE — Assessment & Plan Note (Signed)
Injected today.  Discussed icing regimen and home exercises.  No sign of any bursitis of the shoulder. Discussed icing

## 2017-08-19 ENCOUNTER — Other Ambulatory Visit: Payer: Self-pay | Admitting: Internal Medicine

## 2017-09-04 ENCOUNTER — Ambulatory Visit: Payer: Self-pay

## 2017-09-04 ENCOUNTER — Encounter: Payer: Self-pay | Admitting: Family

## 2017-09-04 ENCOUNTER — Ambulatory Visit (INDEPENDENT_AMBULATORY_CARE_PROVIDER_SITE_OTHER)
Admission: RE | Admit: 2017-09-04 | Discharge: 2017-09-04 | Disposition: A | Discharge status: Home / Self Care | Payer: 59 | Source: Ambulatory Visit | Attending: Family | Admitting: Family

## 2017-09-04 ENCOUNTER — Ambulatory Visit: Payer: 59 | Admitting: Family

## 2017-09-04 ENCOUNTER — Other Ambulatory Visit (INDEPENDENT_AMBULATORY_CARE_PROVIDER_SITE_OTHER): Payer: 59

## 2017-09-04 VITALS — BP 134/80 | HR 74 | Temp 98.1°F | Ht 72.0 in | Wt 207.0 lb

## 2017-09-04 DIAGNOSIS — R040 Epistaxis: Secondary | ICD-10-CM

## 2017-09-04 DIAGNOSIS — R61 Generalized hyperhidrosis: Secondary | ICD-10-CM

## 2017-09-04 LAB — CBC WITH DIFFERENTIAL/PLATELET
BASOS ABS: 0.1 10*3/uL (ref 0.0–0.1)
Basophils Relative: 1 % (ref 0.0–3.0)
EOS ABS: 0.1 10*3/uL (ref 0.0–0.7)
Eosinophils Relative: 1.6 % (ref 0.0–5.0)
HCT: 46.4 % (ref 39.0–52.0)
HEMOGLOBIN: 15.8 g/dL (ref 13.0–17.0)
LYMPHS PCT: 21.7 % (ref 12.0–46.0)
Lymphs Abs: 1.9 10*3/uL (ref 0.7–4.0)
MCHC: 34.1 g/dL (ref 30.0–36.0)
MCV: 90.1 fl (ref 78.0–100.0)
MONO ABS: 0.8 10*3/uL (ref 0.1–1.0)
Monocytes Relative: 9.3 % (ref 3.0–12.0)
Neutro Abs: 5.9 10*3/uL (ref 1.4–7.7)
Neutrophils Relative %: 66.4 % (ref 43.0–77.0)
Platelets: 309 10*3/uL (ref 150.0–400.0)
RBC: 5.14 Mil/uL (ref 4.22–5.81)
RDW: 13.8 % (ref 11.5–15.5)
WBC: 8.8 10*3/uL (ref 4.0–10.5)

## 2017-09-04 LAB — COMPREHENSIVE METABOLIC PANEL
ALK PHOS: 47 U/L (ref 39–117)
ALT: 25 U/L (ref 0–53)
AST: 32 U/L (ref 0–37)
Albumin: 4.4 g/dL (ref 3.5–5.2)
BUN: 27 mg/dL — AB (ref 6–23)
CALCIUM: 9.6 mg/dL (ref 8.4–10.5)
CO2: 29 mEq/L (ref 19–32)
CREATININE: 1.23 mg/dL (ref 0.40–1.50)
Chloride: 103 mEq/L (ref 96–112)
GFR: 61.29 mL/min (ref >60.00)
Glucose, Bld: 101 mg/dL — ABNORMAL HIGH (ref 70–99)
POTASSIUM: 3.9 meq/L (ref 3.5–5.1)
SODIUM: 138 meq/L (ref 135–145)
TOTAL PROTEIN: 6.8 g/dL (ref 6.0–8.3)
Total Bilirubin: 0.4 mg/dL (ref 0.2–1.2)

## 2017-09-04 LAB — TSH: TSH: 2.06 u[IU]/mL (ref 0.35–4.50)
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
## 2017-09-04 NOTE — Telephone Encounter (Signed)
Pt. Reports I nose bleed a day x 5-6 days. Has checked BP - 130/80. Also c/o night sweats. No other symptoms.Appointment made for today. Reason for Disposition . [1] Bleeding recurs 3 or more times in 24 hours AND [2] direct pressure applied correctly  Answer Assessment - Initial Assessment Questions 1. AMOUNT OF BLEEDING: "How bad is the bleeding?" "How much blood was lost?" "Has the bleeding stopped?"   - MILD: needed a couple tissues   - MODERATE: needed many tissues   - SEVERE: large blood clots, soaked many tissues, lasted more than 30 minutes      Moderate 2. ONSET: "When did the nosebleed start?"      No nose bleed now 3. FREQUENCY: "How many nosebleeds have you had in the last 24 hours?"      1 a day x 5 days 4. RECURRENT SYMPTOMS: "Have there been other recent nosebleeds?" If so, ask: "How long did it take you to stop the bleeding?" "What worked best?"      Had nose bleeds years - related to BP 5. CAUSE: "What do you think caused this nosebleed?"     Unsure 6. LOCAL FACTORS: "Do you have any cold symptoms?", "Have you been rubbing or picking at your nose?"     No 7. SYSTEMIC FACTORS: "Do you have high blood pressure or any bleeding problems?"     No 8. BLOOD THINNERS: "Do you take any blood thinners?" (e.g., coumadin, heparin, aspirin, Plavix)     No 9. OTHER SYMPTOMS: "Do you have any other symptoms?" (e.g., lightheadedness)     Night sweats 10. PREGNANCY: "Is there any chance you are pregnant?" "When was your last menstrual period?"       N/A  Protocols used: [id number]

## 2017-09-05 ENCOUNTER — Ambulatory Visit: Payer: 59 | Admitting: Family Medicine

## 2017-09-05 LAB — HIV ANTIBODY (ROUTINE TESTING W REFLEX): HIV: NONREACTIVE

## 2017-09-11 IMAGING — DX DG KNEE COMPLETE 4+V*L*
4 series · 4 of 4 positions shown · non-contrast
Comparison: None.

CLINICAL DATA: 71-year-old with medial knee pain and swelling for 8
months. No known injury.

EXAM:
LEFT KNEE - COMPLETE 4+ VIEW

[knee ap]
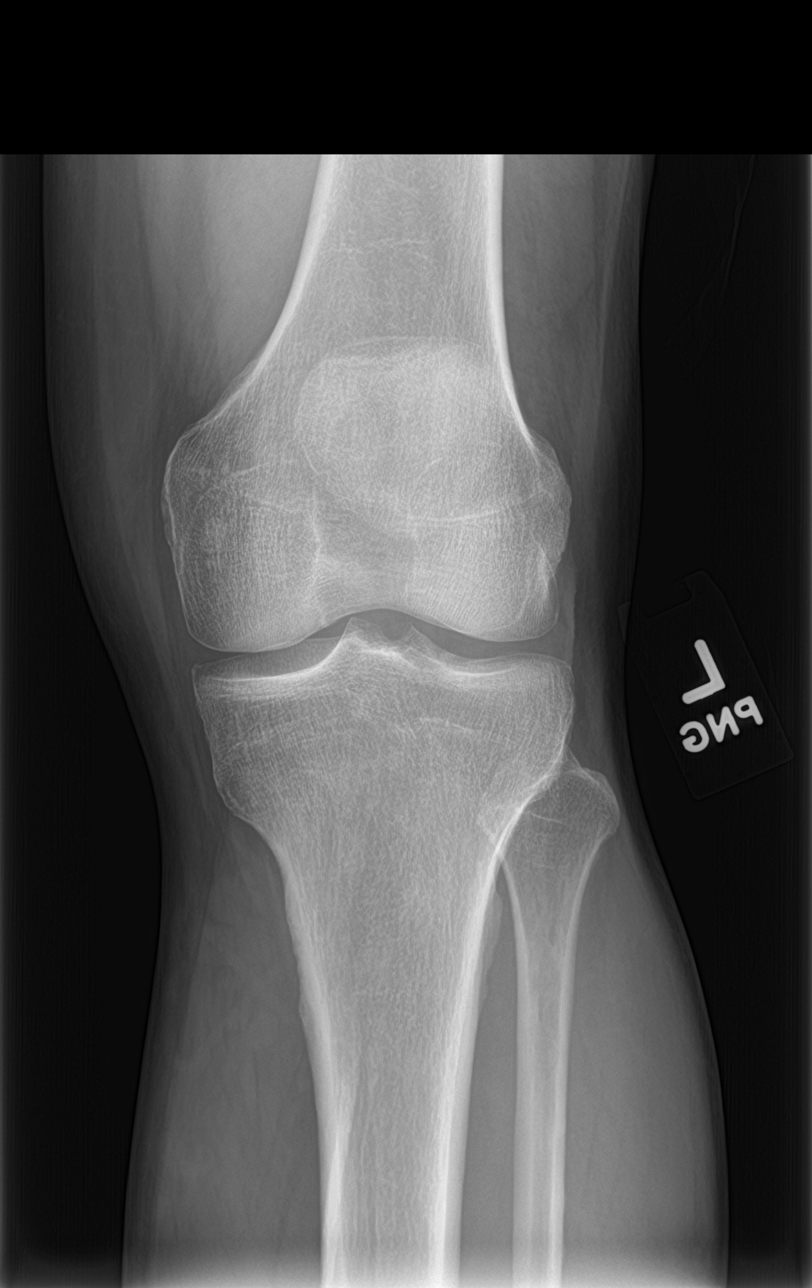

[knee tunnel]
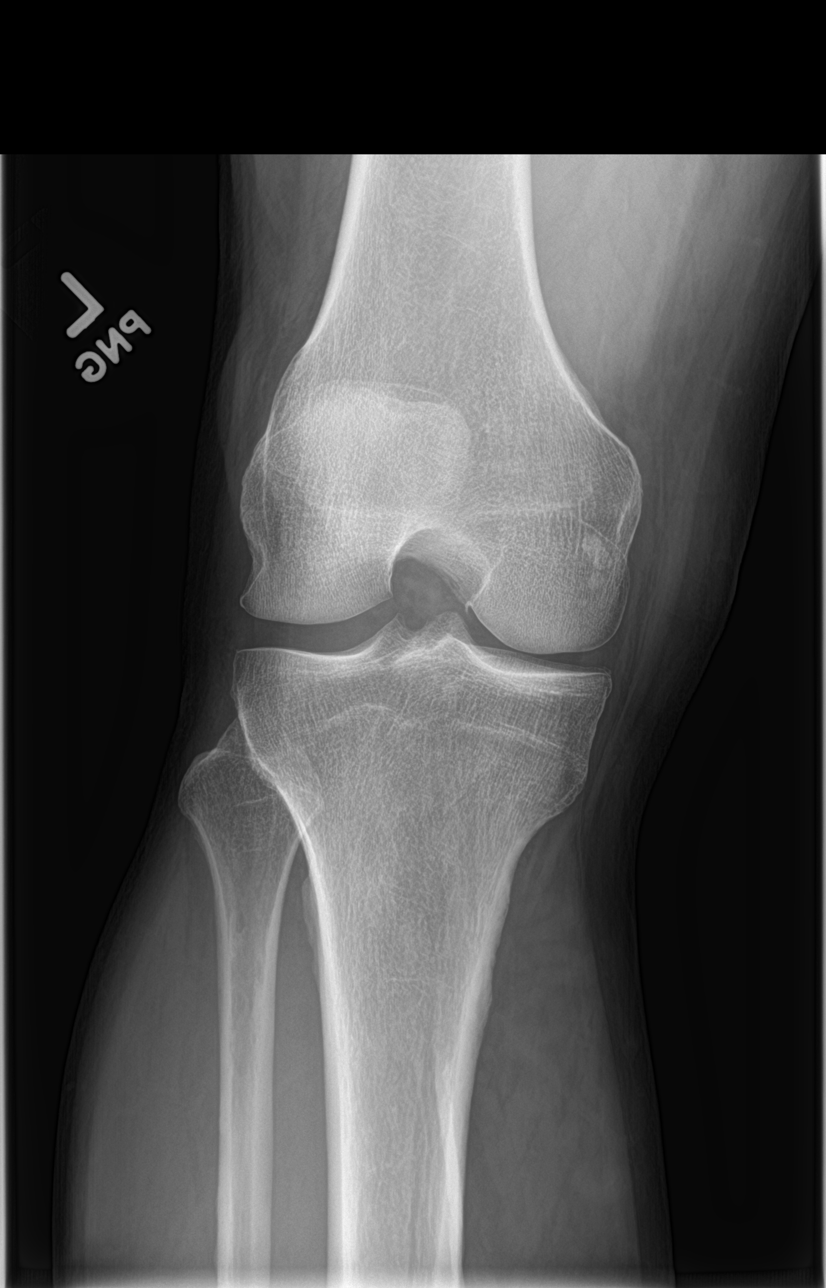

[knee lat]
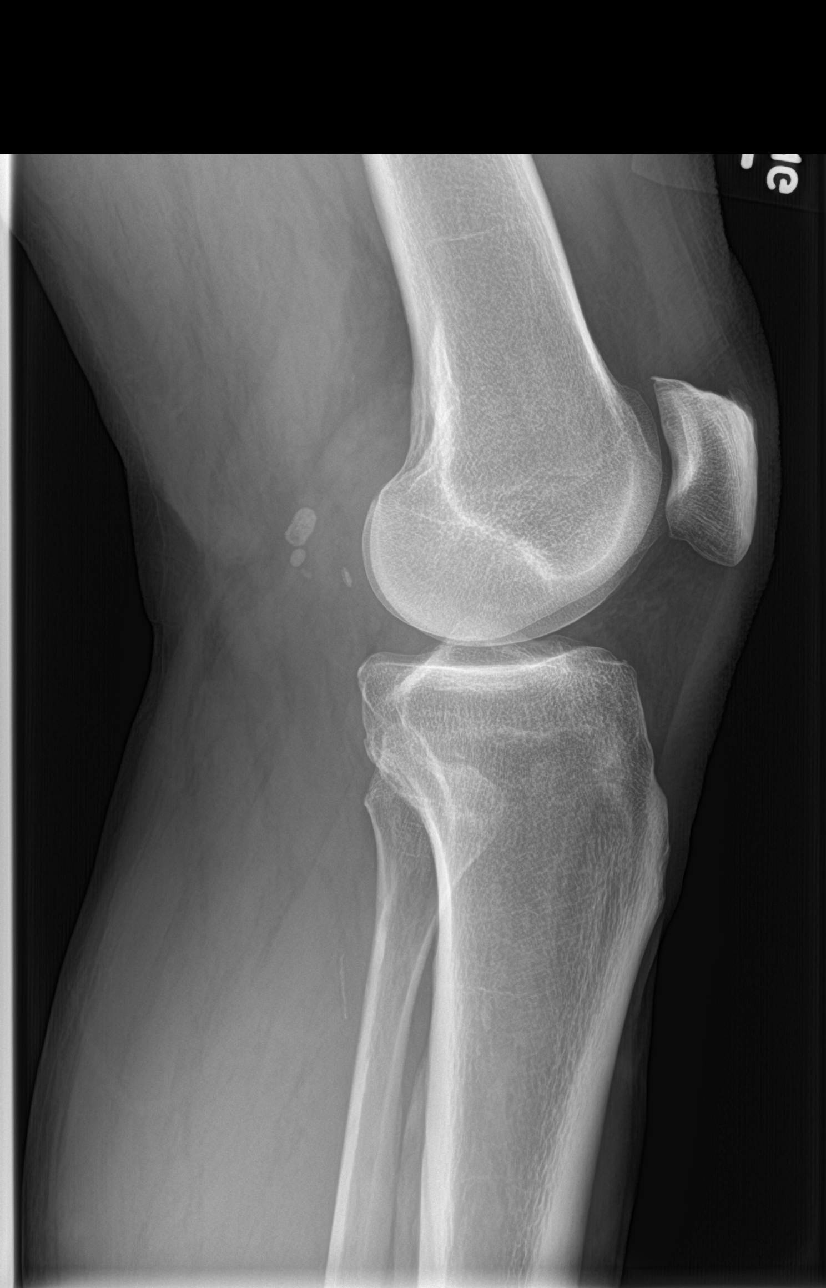

[sunrise]
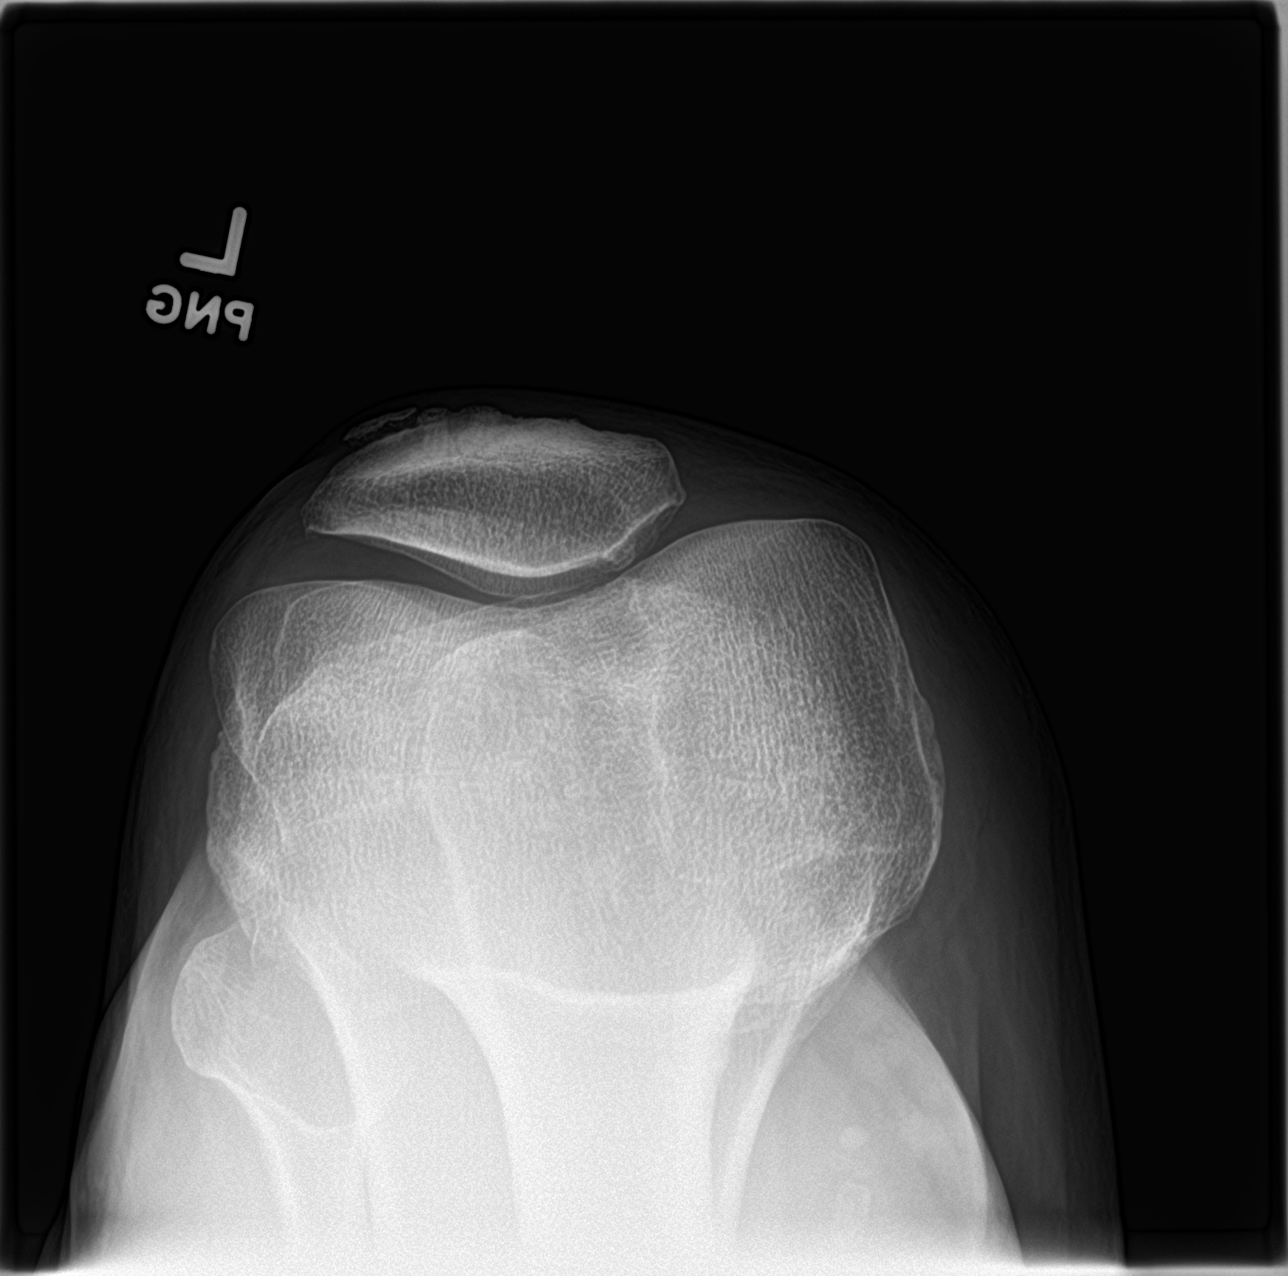

[4 of 4 positions shown; findings below may reference images not displayed]

FINDINGS: The mineralization and alignment are normal. There is no evidence of
acute fracture or dislocation. Equivocal medial joint space
narrowing. There is mild patellofemoral spurring. No significant
joint effusion. There are several calcifications posteromedially,
likely within a Baker's cyst.
IMPRESSION: No acute findings. Probable loose bodies in a Baker's cyst. Minimal
degenerative changes.

## 2017-09-16 ENCOUNTER — Other Ambulatory Visit: Payer: Self-pay | Admitting: *Deleted

## 2017-09-16 MED ORDER — TRIAMTERENE-HCTZ 37.5-25 MG PO TABS: 1.0000 | ORAL_TABLET | Freq: Every day | ORAL | 3 refills | Status: DC
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
## 2017-10-03 ENCOUNTER — Encounter: Payer: Self-pay | Admitting: Family Medicine

## 2017-10-03 ENCOUNTER — Ambulatory Visit (INDEPENDENT_AMBULATORY_CARE_PROVIDER_SITE_OTHER): Payer: 59 | Admitting: Family Medicine

## 2017-10-03 ENCOUNTER — Ambulatory Visit: Payer: Self-pay

## 2017-10-03 VITALS — BP 122/78 | HR 71 | Ht 72.0 in | Wt 206.0 lb

## 2017-10-03 DIAGNOSIS — G8929 Other chronic pain: Secondary | ICD-10-CM

## 2017-10-03 DIAGNOSIS — M7552 Bursitis of left shoulder: Secondary | ICD-10-CM | POA: Insufficient documentation

## 2017-10-03 DIAGNOSIS — M5412 Radiculopathy, cervical region: Secondary | ICD-10-CM

## 2017-10-03 DIAGNOSIS — M25512 Pain in left shoulder: Secondary | ICD-10-CM

## 2017-10-03 HISTORY — DX: Bursitis of left shoulder: M75.52

## 2017-10-03 MED ORDER — PREDNISONE 50 MG PO TABS: 50.0000 mg | ORAL_TABLET | Freq: Every day | ORAL | 0 refills | Status: DC

## 2017-10-03 NOTE — Patient Instructions (Signed)
Good to see you  Ice is you friend.  Keep hands within peripheral vision  Prednisone daily for 5 days IF pain is not better with injection lets get MRI of the neck.   Depending on findings we will consider epidural of the neck.   IF we do injection then we will see you again 2 weeks after

## 2017-10-03 NOTE — Assessment & Plan Note (Signed)
Cervical radiculopathy that seems to be worsening.  Concern for adjacent segment disease.  We did attempt an injection of the shoulder to see if the bursitis is playing a role which I highly think is not likely well.  We discussed with patient about icing regimen, home exercise, which activities doing which wants to avoid.  Patient is to increase activity slowly over the course the next several days.  Patient will have an MRI and would be a candidate for epidurals.  Patient follow-up after imaging to discuss different treatment options.

## 2017-10-03 NOTE — Assessment & Plan Note (Signed)
Patient given injection.  Discussed icing regimen and home exercise.  Discussed which activities of doing which wants to avoid.  Increase activity slowly over the course the next several days.  I do not believe that this is likely contributing to most of the pain we will use this is diagnostic and hopefully therapeutic.  Differential includes patient's lumbar radiculopathy status post a history of an anterior fusion.

## 2017-10-08 ENCOUNTER — Encounter: Payer: Self-pay | Admitting: Family Medicine

## 2017-10-10 ENCOUNTER — Encounter: Payer: Self-pay | Admitting: Family Medicine

## 2017-10-11 ENCOUNTER — Ambulatory Visit
Admission: RE | Admit: 2017-10-11 | Discharge: 2017-10-11 | Disposition: A | Discharge status: Home / Self Care | Payer: 59 | Source: Ambulatory Visit | Attending: Family Medicine | Admitting: Family Medicine

## 2017-10-11 DIAGNOSIS — M25512 Pain in left shoulder: Secondary | ICD-10-CM | POA: Diagnosis not present

## 2017-10-11 DIAGNOSIS — G8929 Other chronic pain: Secondary | ICD-10-CM

## 2017-10-15 ENCOUNTER — Other Ambulatory Visit: Payer: Self-pay

## 2017-10-15 DIAGNOSIS — M5412 Radiculopathy, cervical region: Secondary | ICD-10-CM

## 2017-10-18 ENCOUNTER — Ambulatory Visit
Admission: RE | Admit: 2017-10-18 | Discharge: 2017-10-18 | Disposition: A | Discharge status: Home / Self Care | Payer: 59 | Source: Ambulatory Visit | Attending: Family Medicine | Admitting: Family Medicine

## 2017-10-18 ENCOUNTER — Other Ambulatory Visit: Payer: Self-pay | Admitting: Internal Medicine

## 2017-10-18 DIAGNOSIS — M47812 Spondylosis without myelopathy or radiculopathy, cervical region: Secondary | ICD-10-CM | POA: Diagnosis not present

## 2017-10-18 DIAGNOSIS — M5412 Radiculopathy, cervical region: Secondary | ICD-10-CM

## 2017-10-18 MED ORDER — IOPAMIDOL (ISOVUE-M 200) INJECTION 41%
1.0000 mL | Freq: Once | INTRAMUSCULAR | Status: AC
  Administered 2017-10-18: 1 mL via EPIDURAL

## 2017-10-18 MED ORDER — TRIAMCINOLONE ACETONIDE 40 MG/ML IJ SUSP (RADIOLOGY)
60.0000 mg | Freq: Once | INTRAMUSCULAR | Status: AC
  Administered 2017-10-18: 60 mg via EPIDURAL

## 2017-10-18 NOTE — Telephone Encounter (Signed)
Pt is due for annual appt sent 1 refill until pt is made.Marland KitchenJohny [name]

## 2017-10-18 NOTE — Discharge Instructions (Signed)

## 2017-10-22 ENCOUNTER — Encounter: Payer: Self-pay | Admitting: Family Medicine

## 2017-10-24 DIAGNOSIS — M5412 Radiculopathy, cervical region: Secondary | ICD-10-CM | POA: Diagnosis not present

## 2017-10-25 ENCOUNTER — Encounter: Payer: Self-pay | Admitting: Family Medicine

## 2017-10-25 NOTE — Telephone Encounter (Signed)
FYI

## 2017-10-30 DIAGNOSIS — Z981 Arthrodesis status: Secondary | ICD-10-CM | POA: Diagnosis not present

## 2017-10-30 DIAGNOSIS — M5011 Cervical disc disorder with radiculopathy,  high cervical region: Secondary | ICD-10-CM | POA: Diagnosis not present

## 2017-10-30 DIAGNOSIS — M47892 Other spondylosis, cervical region: Secondary | ICD-10-CM | POA: Diagnosis not present

## 2017-10-30 DIAGNOSIS — M4722 Other spondylosis with radiculopathy, cervical region: Secondary | ICD-10-CM | POA: Diagnosis not present

## 2017-10-30 DIAGNOSIS — M4802 Spinal stenosis, cervical region: Secondary | ICD-10-CM | POA: Diagnosis not present

## 2017-11-06 ENCOUNTER — Ambulatory Visit: Payer: 59 | Admitting: Internal Medicine

## 2017-11-06 ENCOUNTER — Encounter: Payer: Self-pay | Admitting: Internal Medicine

## 2017-11-06 DIAGNOSIS — M5441 Lumbago with sciatica, right side: Secondary | ICD-10-CM

## 2017-11-06 DIAGNOSIS — G4700 Insomnia, unspecified: Secondary | ICD-10-CM

## 2017-11-06 DIAGNOSIS — I10 Essential (primary) hypertension: Secondary | ICD-10-CM | POA: Diagnosis not present

## 2017-11-06 DIAGNOSIS — M5442 Lumbago with sciatica, left side: Secondary | ICD-10-CM

## 2017-11-06 DIAGNOSIS — G8929 Other chronic pain: Secondary | ICD-10-CM

## 2017-11-06 DIAGNOSIS — M542 Cervicalgia: Secondary | ICD-10-CM

## 2017-11-06 MED ORDER — ZOLPIDEM TARTRATE 10 MG PO TABS: ORAL_TABLET | ORAL | 3 refills | Status: DC

## 2017-11-06 MED ORDER — ACETAMINOPHEN-CODEINE #4 300-60 MG PO TABS: ORAL_TABLET | ORAL | 2 refills | Status: DC

## 2017-11-06 NOTE — Progress Notes (Signed)
Subjective:  Patient ID: Paul [name]., male    DOB: 07/12/1945  Age: 72 y.o. MRN: [id number]  CC: No chief complaint on file.   HPI [name] [employer]. presents for depression, insomnia, HTN. Pt just had a neck surgery  Outpatient Medications Prior to Visit  Medication Sig Dispense Refill  . acetaminophen-codeine (TYLENOL #4) 300-60 MG tablet TAKE 1 TABLET BY MOUTH EVERY 6 HOURS AS NEEDED FOR MODERATE PAIN  2  . amLODipine-olmesartan ([name]) 5-40 MG tablet Take 1 tablet by mouth daily. 90 tablet 3  . atorvastatin (LIPITOR) 10 MG tablet Take 1 tablet (10 mg total) by mouth daily. Overdue for annual appt w/labs must see provider for future refills 90 tablet 0  . buPROPion (WELLBUTRIN XL) 300 MG 24 hr tablet Take 1 tablet (300 mg total) by mouth daily. Overdue for annual appt w/labs must see provider for future refills 90 tablet 0  . Cholecalciferol 1000 UNITS tablet Take 1,000 Units by mouth daily.      . fenofibrate (TRICOR) 48 MG tablet Take 1 tablet (48 mg total) by mouth daily. 90 tablet 3  . finasteride (PROSCAR) 5 MG tablet Take 1 tablet (5 mg total) by mouth daily. 90 tablet 3  . pantoprazole (PROTONIX) 40 MG tablet     . predniSONE (DELTASONE) 50 MG tablet Take 1 tablet (50 mg total) by mouth daily. 5 tablet 0  . triamterene-hydrochlorothiazide (MAXZIDE-25) 37.5-25 MG tablet Take 1 tablet by mouth daily. 90 tablet 3  . zolpidem (AMBIEN) 10 MG tablet TAKE 1/2 TO 1 TABLET BY MOUTH EVERY NIGHT AT BEDTIME AS NEEDED 30 tablet 3  . gabapentin (NEURONTIN) 100 MG capsule Take 2 capsules (200 mg total) by mouth at bedtime. 60 capsule 3   No facility-administered medications prior to visit.     ROS: Review of Systems  Constitutional: Negative for appetite change, fatigue and unexpected weight change.  HENT: Negative for congestion, nosebleeds, sneezing, sore throat and trouble swallowing.   Eyes: Negative for itching and visual disturbance.  Respiratory: Negative for cough.     Cardiovascular: Negative for chest pain, palpitations and leg swelling.  Gastrointestinal: Negative for abdominal distention, blood in stool, diarrhea and nausea.  Genitourinary: Negative for frequency and hematuria.  Musculoskeletal: Positive for neck pain and neck stiffness. Negative for back pain, gait problem and joint swelling.  Skin: Negative for rash.  Neurological: Negative for dizziness, tremors, speech difficulty and weakness.  Psychiatric/Behavioral: Negative for agitation, dysphoric mood and sleep disturbance. The patient is not nervous/anxious.     Objective:  BP 138/84 (BP Location: Left Arm, Patient Position: Sitting, Cuff Size: Normal)   Pulse 88   Temp 98 F (36.7 C) (Oral)   Ht 6' (1.829 m)   Wt 201 lb (91.2 kg)   SpO2 98%   BMI 27.26 kg/m   BP Readings from Last 3 Encounters:  11/06/17 138/84  10/18/17 (!) 154/87  10/03/17 122/78    Wt Readings from Last 3 Encounters:  11/06/17 201 lb (91.2 kg)  10/03/17 206 lb (93.4 kg)  09/04/17 207 lb 0.6 oz (93.9 kg)    Physical Exam  Constitutional: He is oriented to person, place, and time. He appears well-developed. No distress.  NAD  HENT:  Mouth/Throat: Oropharynx is clear and moist.  Eyes: Pupils are equal, round, and reactive to light. Conjunctivae are normal.  Neck: Normal range of motion. No JVD present. No thyromegaly present.  Cardiovascular: Normal rate, regular rhythm, normal heart sounds and intact  distal pulses. Exam reveals no gallop and no friction rub.  No murmur heard. Pulmonary/Chest: Effort normal and breath sounds normal. No respiratory distress. He has no wheezes. He has no rales. He exhibits no tenderness.  Abdominal: Soft. Bowel sounds are normal. He exhibits no distension and no mass. There is no tenderness. There is no rebound and no guarding.  Musculoskeletal: Normal range of motion. He exhibits no edema or tenderness.  Lymphadenopathy:    He has no cervical adenopathy.  Neurological:  He is alert and oriented to person, place, and time. He has normal reflexes. No cranial nerve deficit. He exhibits normal muscle tone. He displays a negative Romberg sign. Coordination and gait normal.  Skin: Skin is warm and dry. No rash noted.  Psychiatric: He has a normal mood and affect. His behavior is normal. Judgment and thought content normal.   Op wound  R neck base  Lab Results  Component Value Date   WBC 8.8 09/04/2017   HGB 15.8 09/04/2017   HCT 46.4 09/04/2017   PLT 309.0 09/04/2017   GLUCOSE 101 (H) 09/04/2017   CHOL 135 09/05/2016   TRIG 220.0 (H) 09/05/2016   HDL 33.00 (L) 09/05/2016   LDLDIRECT 67.0 09/05/2016   LDLCALC 98 01/04/2014   ALT 25 09/04/2017   AST 32 09/04/2017   NA 138 09/04/2017   K 3.9 09/04/2017   CL 103 09/04/2017   CREATININE 1.23 09/04/2017   BUN 27 (H) 09/04/2017   CO2 29 09/04/2017   TSH 2.06 09/04/2017   PSA 0.20 05/30/2016    Dg Inject Diag/thera/inc Needle/cath/plc Epi/cerv/thor W/img  Result Date: 10/18/2017 CLINICAL DATA:  Cervical spondylosis without myelopathy. LEFT shoulder symptoms. FLUOROSCOPY TIME:  38 seconds corresponding to a Dose Area Product of 39.77 Gy*m2 PROCEDURE: Informed written consent was obtained.  Time-out was performed. An appropriate skin entry site was chosen, cleansed with Betadine, and anesthetized with 1% lidocaine. CERVICAL EPIDURAL INJECTION An interlaminar approach was performed on the LEFT at C7-T1. A 20 gauge epidural needle was advanced using loss-of-resistance technique. DIAGNOSTIC EPIDURAL INJECTION Injection of Isovue-M 300 initially showed extra-spinal spread, further advancement shows a good epidural pattern with spread above and below the level of needle placement, primarily on the LEFT. No vascular opacification is seen. THERAPEUTIC EPIDURAL INJECTION 1.5 ml of Kenalog 40 mixed with 1 ml of 1% Lidocaine and 2 ml of normal saline were then instilled. The procedure was well-tolerated, and the patient was  discharged thirty minutes following the injection in good condition. IMPRESSION: Technically successful first epidural injection on the LEFT at C7-T1. Electronically Signed   By: Staci Righter M.D.   On: 10/18/2017 09:28    Assessment & Plan:   There are no diagnoses linked to this encounter.   No orders of the defined types were placed in this encounter.    Follow-up: No follow-ups on file.  Walker Kehr, MD

## 2017-11-06 NOTE — Assessment & Plan Note (Signed)
Zolpidem prn  Potential benefits of a long term benzodiazepines  use as well as potential risks  and complications were explained to the patient and were aknowledged. 

## 2017-11-06 NOTE — Assessment & Plan Note (Signed)
[name] 

## 2017-11-06 NOTE — Assessment & Plan Note (Signed)
T#4  Potential benefits of a long term opioids use as well as potential risks (i.e. addiction risk, apnea etc) and complications (i.e. Somnolence, constipation and others) were explained to the patient and were aknowledged.  

## 2017-11-06 NOTE — Assessment & Plan Note (Signed)
S/p surgery - Dr Saintclair Halsted

## 2017-11-28 DIAGNOSIS — M542 Cervicalgia: Secondary | ICD-10-CM | POA: Diagnosis not present

## 2017-12-27 ENCOUNTER — Other Ambulatory Visit: Payer: Self-pay

## 2017-12-27 MED ORDER — ZOLPIDEM TARTRATE 10 MG PO TABS: ORAL_TABLET | ORAL | 3 refills | Status: DC

## 2017-12-27 NOTE — Telephone Encounter (Signed)
Checked controlled substance database, last filled 11/10/2017. Last OV: 11/06/2017 Next Ov: 02/12/2018

## 2018-01-02 DIAGNOSIS — M62838 Other muscle spasm: Secondary | ICD-10-CM | POA: Diagnosis not present

## 2018-01-02 DIAGNOSIS — M545 Low back pain: Secondary | ICD-10-CM | POA: Diagnosis not present

## 2018-01-02 DIAGNOSIS — M9903 Segmental and somatic dysfunction of lumbar region: Secondary | ICD-10-CM | POA: Diagnosis not present

## 2018-01-03 DIAGNOSIS — M545 Low back pain: Secondary | ICD-10-CM | POA: Diagnosis not present

## 2018-01-03 DIAGNOSIS — M9903 Segmental and somatic dysfunction of lumbar region: Secondary | ICD-10-CM | POA: Diagnosis not present

## 2018-01-03 DIAGNOSIS — M62838 Other muscle spasm: Secondary | ICD-10-CM | POA: Diagnosis not present

## 2018-01-06 DIAGNOSIS — M545 Low back pain: Secondary | ICD-10-CM | POA: Diagnosis not present

## 2018-01-06 DIAGNOSIS — M9903 Segmental and somatic dysfunction of lumbar region: Secondary | ICD-10-CM | POA: Diagnosis not present

## 2018-01-06 DIAGNOSIS — M62838 Other muscle spasm: Secondary | ICD-10-CM | POA: Diagnosis not present

## 2018-01-09 DIAGNOSIS — M62838 Other muscle spasm: Secondary | ICD-10-CM | POA: Diagnosis not present

## 2018-01-09 DIAGNOSIS — M542 Cervicalgia: Secondary | ICD-10-CM | POA: Diagnosis not present

## 2018-01-09 DIAGNOSIS — M545 Low back pain: Secondary | ICD-10-CM | POA: Diagnosis not present

## 2018-01-09 DIAGNOSIS — M9903 Segmental and somatic dysfunction of lumbar region: Secondary | ICD-10-CM | POA: Diagnosis not present

## 2018-01-13 DIAGNOSIS — M9903 Segmental and somatic dysfunction of lumbar region: Secondary | ICD-10-CM | POA: Diagnosis not present

## 2018-01-13 DIAGNOSIS — M545 Low back pain: Secondary | ICD-10-CM | POA: Diagnosis not present

## 2018-01-13 DIAGNOSIS — M62838 Other muscle spasm: Secondary | ICD-10-CM | POA: Diagnosis not present

## 2018-01-15 ENCOUNTER — Other Ambulatory Visit: Payer: Self-pay | Admitting: Internal Medicine

## 2018-01-15 DIAGNOSIS — M62838 Other muscle spasm: Secondary | ICD-10-CM | POA: Diagnosis not present

## 2018-01-15 DIAGNOSIS — M545 Low back pain: Secondary | ICD-10-CM | POA: Diagnosis not present

## 2018-01-15 DIAGNOSIS — M9903 Segmental and somatic dysfunction of lumbar region: Secondary | ICD-10-CM | POA: Diagnosis not present

## 2018-01-24 DIAGNOSIS — M9903 Segmental and somatic dysfunction of lumbar region: Secondary | ICD-10-CM | POA: Diagnosis not present

## 2018-01-24 DIAGNOSIS — M545 Low back pain: Secondary | ICD-10-CM | POA: Diagnosis not present

## 2018-01-24 DIAGNOSIS — M62838 Other muscle spasm: Secondary | ICD-10-CM | POA: Diagnosis not present

## 2018-02-04 ENCOUNTER — Encounter: Payer: Self-pay | Admitting: Family Medicine

## 2018-02-12 ENCOUNTER — Encounter: Payer: Self-pay | Admitting: Internal Medicine

## 2018-02-12 ENCOUNTER — Ambulatory Visit: Payer: 59 | Admitting: Internal Medicine

## 2018-02-12 VITALS — BP 144/86 | HR 76 | Temp 97.6°F | Ht 72.0 in | Wt 204.0 lb

## 2018-02-12 DIAGNOSIS — G8929 Other chronic pain: Secondary | ICD-10-CM

## 2018-02-12 DIAGNOSIS — M5442 Lumbago with sciatica, left side: Secondary | ICD-10-CM

## 2018-02-12 DIAGNOSIS — I10 Essential (primary) hypertension: Secondary | ICD-10-CM | POA: Diagnosis not present

## 2018-02-12 DIAGNOSIS — F411 Generalized anxiety disorder: Secondary | ICD-10-CM

## 2018-02-12 DIAGNOSIS — J301 Allergic rhinitis due to pollen: Secondary | ICD-10-CM

## 2018-02-12 DIAGNOSIS — Z23 Encounter for immunization: Secondary | ICD-10-CM

## 2018-02-12 DIAGNOSIS — E785 Hyperlipidemia, unspecified: Secondary | ICD-10-CM | POA: Diagnosis not present

## 2018-02-12 DIAGNOSIS — M5441 Lumbago with sciatica, right side: Secondary | ICD-10-CM

## 2018-02-12 MED ORDER — FLUTICASONE FUROATE-VILANTEROL 100-25 MCG/INH IN AEPB: 1.0000 | INHALATION_SPRAY | Freq: Every day | RESPIRATORY_TRACT | 5 refills | Status: DC

## 2018-02-12 MED ORDER — LORATADINE 10 MG PO TABS: 10.0000 mg | ORAL_TABLET | Freq: Every day | ORAL | 3 refills | Status: DC

## 2018-02-12 MED ORDER — ACETAMINOPHEN-CODEINE #4 300-60 MG PO TABS: ORAL_TABLET | ORAL | 2 refills | Status: DC

## 2018-02-12 NOTE — Assessment & Plan Note (Signed)
[name] 

## 2018-02-12 NOTE — Progress Notes (Signed)
Subjective:  Patient ID: Paul [name]., male    DOB: 08-31-1945  Age: 72 y.o. MRN: [id number]  CC: No chief complaint on file.   HPI Paul [name] [employer]. presents for thrush - he took Diflucan C/o cough x years; s/p ENT eval - tickling in the throat H/o asthma as a child. F/u chronic pain  Outpatient Medications Prior to Visit  Medication Sig Dispense Refill  . acetaminophen-codeine (TYLENOL #4) 300-60 MG tablet TAKE 1 TABLET BY MOUTH EVERY 6 HOURS AS NEEDED FOR MODERATE PAIN 30 tablet 2  . amLODipine-olmesartan ([name]) 5-40 MG tablet Take 1 tablet by mouth daily. 90 tablet 3  . atorvastatin (LIPITOR) 10 MG tablet Take 1 tablet (10 mg total) by mouth daily. 90 tablet 0  . buPROPion (WELLBUTRIN XL) 300 MG 24 hr tablet Take 1 tablet (300 mg total) by mouth daily. 90 tablet 0  . Cholecalciferol 1000 UNITS tablet Take 1,000 Units by mouth daily.      . fenofibrate (TRICOR) 48 MG tablet Take 1 tablet (48 mg total) by mouth daily. 90 tablet 3  . finasteride (PROSCAR) 5 MG tablet Take 1 tablet (5 mg total) by mouth daily. 90 tablet 3  . pantoprazole (PROTONIX) 40 MG tablet     . triamterene-hydrochlorothiazide (MAXZIDE-25) 37.5-25 MG tablet Take 1 tablet by mouth daily. 90 tablet 3  . zolpidem (AMBIEN) 10 MG tablet TAKE 1/2 TO 1 TABLET BY MOUTH EVERY NIGHT AT BEDTIME AS NEEDED 30 tablet 3  . predniSONE (DELTASONE) 50 MG tablet Take 1 tablet (50 mg total) by mouth daily. (Patient not taking: Reported on 02/12/2018) 5 tablet 0   No facility-administered medications prior to visit.     ROS: Review of Systems  Constitutional: Negative for appetite change, fatigue and unexpected weight change.  HENT: Negative for congestion, nosebleeds, sneezing, sore throat and trouble swallowing.   Eyes: Negative for itching and visual disturbance.  Respiratory: Negative for cough.   Cardiovascular: Negative for chest pain, palpitations and leg swelling.  Gastrointestinal: Negative for abdominal  distention, blood in stool, diarrhea and nausea.  Genitourinary: Negative for frequency and hematuria.  Musculoskeletal: Negative for back pain, gait problem, joint swelling and neck pain.  Skin: Negative for rash.  Neurological: Negative for dizziness, tremors, speech difficulty and weakness.  Psychiatric/Behavioral: Negative for agitation, dysphoric mood, sleep disturbance and suicidal ideas. The patient is not nervous/anxious.     Objective:  BP (!) 144/86 (BP Location: Left Arm, Patient Position: Sitting, Cuff Size: Normal)   Pulse 76   Temp 97.6 F (36.4 C) (Oral)   Ht 6' (1.829 m)   Wt 204 lb (92.5 kg)   SpO2 96%   BMI 27.67 kg/m   BP Readings from Last 3 Encounters:  02/12/18 (!) 144/86  11/06/17 138/84  10/18/17 (!) 154/87    Wt Readings from Last 3 Encounters:  02/12/18 204 lb (92.5 kg)  11/06/17 201 lb (91.2 kg)  10/03/17 206 lb (93.4 kg)    Physical Exam  Constitutional: He is oriented to person, place, and time. He appears well-developed. No distress.  NAD  HENT:  Mouth/Throat: Oropharynx is clear and moist.  Eyes: Pupils are equal, round, and reactive to light. Conjunctivae are normal.  Neck: Normal range of motion. No JVD present. No thyromegaly present.  Cardiovascular: Normal rate, regular rhythm, normal heart sounds and intact distal pulses. Exam reveals no gallop and no friction rub.  No murmur heard. Pulmonary/Chest: Effort normal and breath sounds normal. No respiratory  distress. He has no wheezes. He has no rales. He exhibits no tenderness.  Abdominal: Soft. Bowel sounds are normal. He exhibits no distension and no mass. There is no tenderness. There is no rebound and no guarding.  Musculoskeletal: Normal range of motion. He exhibits no edema or tenderness.  Lymphadenopathy:    He has no cervical adenopathy.  Neurological: He is alert and oriented to person, place, and time. He has normal reflexes. No cranial nerve deficit. He exhibits normal muscle  tone. He displays a negative Romberg sign. Coordination and gait normal.  Skin: Skin is warm and dry. No rash noted.  Psychiatric: He has a normal mood and affect. His behavior is normal. Judgment and thought content normal.   I personally provided Breo inhaler use teaching. After the teaching patient was able to demonstrate it's use effectively. All questions were answered  Lab Results  Component Value Date   WBC 8.8 09/04/2017   HGB 15.8 09/04/2017   HCT 46.4 09/04/2017   PLT 309.0 09/04/2017   GLUCOSE 101 (H) 09/04/2017   CHOL 135 09/05/2016   TRIG 220.0 (H) 09/05/2016   HDL 33.00 (L) 09/05/2016   LDLDIRECT 67.0 09/05/2016   LDLCALC 98 01/04/2014   ALT 25 09/04/2017   AST 32 09/04/2017   NA 138 09/04/2017   K 3.9 09/04/2017   CL 103 09/04/2017   CREATININE 1.23 09/04/2017   BUN 27 (H) 09/04/2017   CO2 29 09/04/2017   TSH 2.06 09/04/2017   PSA 0.20 05/30/2016    Dg Inject Diag/thera/inc Needle/cath/plc Epi/cerv/thor W/img  Result Date: 10/18/2017 CLINICAL DATA:  Cervical spondylosis without myelopathy. LEFT shoulder symptoms. FLUOROSCOPY TIME:  38 seconds corresponding to a Dose Area Product of 39.77 Gy*m2 PROCEDURE: Informed written consent was obtained.  Time-out was performed. An appropriate skin entry site was chosen, cleansed with Betadine, and anesthetized with 1% lidocaine. CERVICAL EPIDURAL INJECTION An interlaminar approach was performed on the LEFT at C7-T1. A 20 gauge epidural needle was advanced using loss-of-resistance technique. DIAGNOSTIC EPIDURAL INJECTION Injection of Isovue-M 300 initially showed extra-spinal spread, further advancement shows a good epidural pattern with spread above and below the level of needle placement, primarily on the LEFT. No vascular opacification is seen. THERAPEUTIC EPIDURAL INJECTION 1.5 ml of Kenalog 40 mixed with 1 ml of 1% Lidocaine and 2 ml of normal saline were then instilled. The procedure was well-tolerated, and the patient was  discharged thirty minutes following the injection in good condition. IMPRESSION: Technically successful first epidural injection on the LEFT at C7-T1. Electronically Signed   By: Staci Righter M.D.   On: 10/18/2017 09:28    Assessment & Plan:   There are no diagnoses linked to this encounter.   No orders of the defined types were placed in this encounter.    Follow-up: No follow-ups on file.  Walker Kehr, MD

## 2018-02-12 NOTE — Assessment & Plan Note (Signed)
T#4  Potential benefits of a long term opioids use as well as potential risks (i.e. addiction risk, apnea etc) and complications (i.e. Somnolence, constipation and others) were explained to the patient and were aknowledged.

## 2018-02-12 NOTE — Assessment & Plan Note (Addendum)
[facility]  CT ca scoring inf given

## 2018-02-12 NOTE — Assessment & Plan Note (Signed)
Claritin

## 2018-02-12 NOTE — Patient Instructions (Signed)
Use [employer]oxic[employer] tooth paste   Cardiac CT calcium scoring test $150   Computed tomography, more commonly known as a CT or CAT scan, is a diagnostic medical imaging test. Like traditional x-rays, it produces multiple images or pictures of the inside of the body. The cross-sectional images generated during a CT scan can be reformatted in multiple planes. They can even generate three-dimensional images. These images can be viewed on a computer monitor, printed on film or by a 3D printer, or transferred to a CD or DVD. CT images of internal organs, bones, soft tissue and blood vessels provide greater detail than traditional x-rays, particularly of soft tissues and blood vessels. A cardiac CT scan for coronary calcium is a non-invasive way of obtaining information about the presence, location and extent of calcified plaque in the coronary arteries-the vessels that supply oxygen-containing blood to the heart muscle. Calcified plaque results when there is a build-up of fat and other substances under the inner layer of the artery. This material can calcify which signals the presence of atherosclerosis, a disease of the vessel wall, also called coronary artery disease (CAD). People with this disease have an increased risk for heart attacks. In addition, over time, progression of plaque build up (CAD) can narrow the arteries or even close off blood flow to the heart. The result may be chest pain, sometimes called "angina," or a heart attack. Because calcium is a marker of CAD, the amount of calcium detected on a cardiac CT scan is a helpful prognostic tool. The findings on cardiac CT are expressed as a calcium score. Another name for this test is coronary artery calcium scoring.  What are some common uses of the procedure? The goal of cardiac CT scan for calcium scoring is to determine if CAD is present and to what extent, even if there are no symptoms. It is a screening study that may be recommended by  a physician for patients with risk factors for CAD but no clinical symptoms. The major risk factors for CAD are: . high blood cholesterol levels  . family history of heart attacks  . diabetes  . high blood pressure  . cigarette smoking  . overweight or obese  . physical inactivity   A negative cardiac CT scan for calcium scoring shows no calcification within the coronary arteries. This suggests that CAD is absent or so minimal it cannot be seen by this technique. The chance of having a heart attack over the next two to five years is very low under these circumstances. A positive test means that CAD is present, regardless of whether or not the patient is experiencing any symptoms. The amount of calcification-expressed as the calcium score-may help to predict the likelihood of a myocardial infarction (heart attack) in the coming years and helps your medical doctor or cardiologist decide whether the patient may need to take preventive medicine or undertake other measures such as diet and exercise to lower the risk for heart attack. The extent of CAD is graded according to your calcium score:  Calcium Score  Presence of CAD  0 No evidence of CAD   1-10 Minimal evidence of CAD  11-100 Mild evidence of CAD  101-400 Moderate evidence of CAD  Over 400 Extensive evidence of CAD

## 2018-02-12 NOTE — Assessment & Plan Note (Signed)
On wellbutrin XL

## 2018-02-24 DIAGNOSIS — M62838 Other muscle spasm: Secondary | ICD-10-CM | POA: Diagnosis not present

## 2018-02-24 DIAGNOSIS — M9903 Segmental and somatic dysfunction of lumbar region: Secondary | ICD-10-CM | POA: Diagnosis not present

## 2018-02-24 DIAGNOSIS — M545 Low back pain: Secondary | ICD-10-CM | POA: Diagnosis not present

## 2018-03-13 DIAGNOSIS — M545 Low back pain: Secondary | ICD-10-CM | POA: Diagnosis not present

## 2018-03-13 DIAGNOSIS — M62838 Other muscle spasm: Secondary | ICD-10-CM | POA: Diagnosis not present

## 2018-03-13 DIAGNOSIS — M9903 Segmental and somatic dysfunction of lumbar region: Secondary | ICD-10-CM | POA: Diagnosis not present

## 2018-03-18 ENCOUNTER — Other Ambulatory Visit: Payer: Self-pay | Admitting: Internal Medicine

## 2018-03-21 ENCOUNTER — Ambulatory Visit (INDEPENDENT_AMBULATORY_CARE_PROVIDER_SITE_OTHER)
Admission: RE | Admit: 2018-03-21 | Discharge: 2018-03-21 | Disposition: A | Discharge status: Home / Self Care | Payer: 59 | Source: Ambulatory Visit | Attending: Internal Medicine | Admitting: Internal Medicine

## 2018-03-21 DIAGNOSIS — E785 Hyperlipidemia, unspecified: Secondary | ICD-10-CM

## 2018-04-16 ENCOUNTER — Other Ambulatory Visit: Payer: Self-pay | Admitting: Internal Medicine

## 2018-04-27 ENCOUNTER — Other Ambulatory Visit: Payer: Self-pay | Admitting: Internal Medicine

## 2018-04-30 MED ORDER — ACETAMINOPHEN-CODEINE #4 300-60 MG PO TABS: ORAL_TABLET | ORAL | 0 refills | Status: DC

## 2018-05-06 DIAGNOSIS — M542 Cervicalgia: Secondary | ICD-10-CM | POA: Diagnosis not present

## 2018-05-06 DIAGNOSIS — R1312 Dysphagia, oropharyngeal phase: Secondary | ICD-10-CM | POA: Diagnosis not present

## 2018-05-19 ENCOUNTER — Ambulatory Visit: Payer: 59 | Admitting: Internal Medicine

## 2018-05-23 ENCOUNTER — Ambulatory Visit: Payer: 59 | Admitting: Internal Medicine

## 2018-05-28 DIAGNOSIS — L820 Inflamed seborrheic keratosis: Secondary | ICD-10-CM | POA: Diagnosis not present

## 2018-05-28 DIAGNOSIS — L570 Actinic keratosis: Secondary | ICD-10-CM | POA: Diagnosis not present

## 2018-05-28 DIAGNOSIS — L821 Other seborrheic keratosis: Secondary | ICD-10-CM | POA: Diagnosis not present

## 2018-05-28 DIAGNOSIS — D485 Neoplasm of uncertain behavior of skin: Secondary | ICD-10-CM | POA: Diagnosis not present

## 2018-06-07 ENCOUNTER — Other Ambulatory Visit: Payer: Self-pay | Admitting: Internal Medicine

## 2018-06-08 NOTE — Telephone Encounter (Signed)
Please schedule an office visit.

## 2018-07-01 ENCOUNTER — Ambulatory Visit (INDEPENDENT_AMBULATORY_CARE_PROVIDER_SITE_OTHER): Payer: 59 | Admitting: Internal Medicine

## 2018-07-01 ENCOUNTER — Encounter: Payer: Self-pay | Admitting: Internal Medicine

## 2018-07-01 DIAGNOSIS — G4700 Insomnia, unspecified: Secondary | ICD-10-CM | POA: Diagnosis not present

## 2018-07-01 DIAGNOSIS — M5442 Lumbago with sciatica, left side: Secondary | ICD-10-CM

## 2018-07-01 DIAGNOSIS — G8929 Other chronic pain: Secondary | ICD-10-CM

## 2018-07-01 DIAGNOSIS — I10 Essential (primary) hypertension: Secondary | ICD-10-CM

## 2018-07-01 DIAGNOSIS — M5441 Lumbago with sciatica, right side: Secondary | ICD-10-CM | POA: Diagnosis not present

## 2018-07-01 MED ORDER — ACETAMINOPHEN-CODEINE 300-60 MG PO TABS: ORAL_TABLET | ORAL | 2 refills | Status: DC

## 2018-07-01 NOTE — Assessment & Plan Note (Signed)
Zolpidem prn  Potential benefits of a long term benzodiazepines  use as well as potential risks  and complications were explained to the patient and were aknowledged. 

## 2018-07-01 NOTE — Progress Notes (Signed)
Virtual Visit via Telephone Note  I connected with Paul [name]. on 07/01/18 at  8:10 AM EDT by telephone and verified that I am speaking with the correct person using two identifiers.   I discussed the limitations, risks, security and privacy concerns of performing an evaluation and management service by telephone and the availability of in person appointments. I also discussed with the patient that there may be a patient responsible charge related to this service. The patient expressed understanding and agreed to proceed.   History of Present Illness:  F/u LBP, insomnia, HTN   Observations/Objective: Rich looks well  Assessment and Plan: See Plan  Follow Up Instructions:    I discussed the assessment and treatment plan with the patient. The patient was provided an opportunity to ask questions and all were answered. The patient agreed with the plan and demonstrated an understanding of the instructions.   The patient was advised to call back or seek an in-person evaluation if the symptoms worsen or if the condition fails to improve as anticipated.  I provided 20 minutes of non-face-to-face time during this encounter.   Walker Kehr, MD

## 2018-07-01 NOTE — Assessment & Plan Note (Signed)
[name] 

## 2018-07-01 NOTE — Assessment & Plan Note (Signed)
Continue w/T#4  Potential benefits of a long term opioids use as well as potential risks (i.e. addiction risk, apnea etc) and complications (i.e. Somnolence, constipation and others) were explained to the patient and were aknowledged.

## 2018-07-15 ENCOUNTER — Other Ambulatory Visit: Payer: Self-pay | Admitting: Internal Medicine

## 2018-08-14 ENCOUNTER — Encounter: Payer: Self-pay | Admitting: Family

## 2018-08-15 ENCOUNTER — Other Ambulatory Visit: Payer: Self-pay | Admitting: Internal Medicine

## 2018-10-17 ENCOUNTER — Other Ambulatory Visit: Payer: Self-pay | Admitting: Internal Medicine

## 2018-11-09 ENCOUNTER — Other Ambulatory Visit: Payer: Self-pay | Admitting: Internal Medicine

## 2018-11-09 MED ORDER — SILDENAFIL CITRATE 100 MG PO TABS: 100.0000 mg | ORAL_TABLET | Freq: Every day | ORAL | 3 refills | Status: DC | PRN

## 2018-11-11 ENCOUNTER — Other Ambulatory Visit: Payer: Self-pay | Admitting: Internal Medicine

## 2018-11-12 NOTE — Telephone Encounter (Signed)
[place] Controlled Database Checked Last filled: 09/17/18 # 30 LOV w/you: 07/01/18 Next appt w/you: None

## 2018-11-19 ENCOUNTER — Other Ambulatory Visit: Payer: Self-pay

## 2018-11-19 ENCOUNTER — Encounter: Payer: Self-pay | Admitting: Internal Medicine

## 2018-11-19 ENCOUNTER — Ambulatory Visit (INDEPENDENT_AMBULATORY_CARE_PROVIDER_SITE_OTHER): Payer: 59 | Admitting: Internal Medicine

## 2018-11-19 VITALS — BP 140/88 | HR 87 | Temp 98.3°F | Ht 72.0 in | Wt 197.0 lb

## 2018-11-19 DIAGNOSIS — R21 Rash and other nonspecific skin eruption: Secondary | ICD-10-CM

## 2018-11-19 DIAGNOSIS — Z23 Encounter for immunization: Secondary | ICD-10-CM | POA: Diagnosis not present

## 2018-11-19 DIAGNOSIS — L304 Erythema intertrigo: Secondary | ICD-10-CM

## 2018-11-19 DIAGNOSIS — F411 Generalized anxiety disorder: Secondary | ICD-10-CM

## 2018-11-19 DIAGNOSIS — K2100 Gastro-esophageal reflux disease with esophagitis, without bleeding: Secondary | ICD-10-CM

## 2018-11-19 DIAGNOSIS — J4520 Mild intermittent asthma, uncomplicated: Secondary | ICD-10-CM | POA: Diagnosis not present

## 2018-11-19 DIAGNOSIS — G4700 Insomnia, unspecified: Secondary | ICD-10-CM

## 2018-11-19 DIAGNOSIS — I10 Essential (primary) hypertension: Secondary | ICD-10-CM

## 2018-11-19 MED ORDER — ACETAMINOPHEN-CODEINE 300-60 MG PO TABS: 1.0000 | ORAL_TABLET | Freq: Two times a day (BID) | ORAL | 0 refills | Status: DC | PRN

## 2018-11-19 MED ORDER — CLOTRIMAZOLE-BETAMETHASONE 1-0.05 % EX CREA: 1.0000 "application " | TOPICAL_CREAM | Freq: Two times a day (BID) | CUTANEOUS | 2 refills | Status: DC

## 2018-11-19 MED ORDER — ZOLPIDEM TARTRATE 10 MG PO TABS: 10.0000 mg | ORAL_TABLET | Freq: Every evening | ORAL | 1 refills | Status: DC | PRN

## 2018-11-19 MED ORDER — METHYLPREDNISOLONE 4 MG PO TBPK: ORAL_TABLET | ORAL | 0 refills | Status: DC

## 2018-11-19 MED ORDER — TRIAMCINOLONE ACETONIDE 0.5 % EX CREA: 1.0000 "application " | TOPICAL_CREAM | Freq: Three times a day (TID) | CUTANEOUS | 1 refills | Status: DC

## 2018-11-19 NOTE — Assessment & Plan Note (Signed)
Recurrent cough

## 2018-11-19 NOTE — Assessment & Plan Note (Signed)
Zolpidem prn  Potential benefits of a long term benzodiazepines  use as well as potential risks  and complications were explained to the patient and were aknowledged. 

## 2018-11-19 NOTE — Progress Notes (Signed)
Subjective:  Patient ID: Paul Baron., male    DOB: 12-16-1945  Age: 73 y.o. MRN: [id number]  CC: No chief complaint on file.   HPI Paul [employer]. presents for LBP, insomnia C/o chigger bites C/o chafing  L groin  Outpatient Medications Prior to Visit  Medication Sig Dispense Refill  . acetaminophen-codeine (TYLENOL #4) 300-60 MG tablet TAKE 1 TABLET BY MOUTH EVERY 12 HOURS AS NEEDED FOR PAIN 60 tablet 0  . amLODipine-olmesartan ([name]) 5-40 MG tablet Take 1 tablet by mouth daily. 90 tablet 3  . atorvastatin (LIPITOR) 10 MG tablet Take 1 tablet (10 mg total) by mouth daily. 90 tablet 3  . buPROPion (WELLBUTRIN XL) 300 MG 24 hr tablet Take 1 tablet (300 mg total) by mouth daily. 90 tablet 3  . Cholecalciferol 1000 UNITS tablet Take 1,000 Units by mouth daily.      . fenofibrate (TRICOR) 48 MG tablet Take 1 tablet (48 mg total) by mouth daily. 90 tablet 2  . finasteride (PROSCAR) 5 MG tablet Take 1 tablet (5 mg total) by mouth daily. 90 tablet 3  . fluticasone furoate-vilanterol (BREO ELLIPTA) 100-25 MCG/INH AEPB Inhale 1 puff into the lungs daily. 1 each 5  . loratadine (CLARITIN) 10 MG tablet Take 1 tablet (10 mg total) by mouth daily. 100 tablet 3  . pantoprazole (PROTONIX) 40 MG tablet Take 1 tablet (40 mg total) by mouth daily. 90 tablet 2  . sildenafil (VIAGRA) 100 MG tablet Take 1 tablet (100 mg total) by mouth daily as needed for erectile dysfunction. 12 tablet 3  . triamterene-hydrochlorothiazide (MAXZIDE-25) 37.5-25 MG tablet Take 1 tablet by mouth daily. 90 tablet 3  . zolpidem (AMBIEN) 10 MG tablet TAKE 1/2 TO 1 TABLET BY MOUTH EVERY NIGHT AT BEDTIME AS NEEDED 30 tablet 1   No facility-administered medications prior to visit.     ROS: Review of Systems  Constitutional: Negative for appetite change, fatigue and unexpected weight change.  HENT: Negative for congestion, nosebleeds, sneezing, sore throat and trouble swallowing.   Eyes: Negative for itching and  visual disturbance.  Respiratory: Positive for cough.   Cardiovascular: Negative for chest pain, palpitations and leg swelling.  Gastrointestinal: Negative for abdominal distention, blood in stool, diarrhea and nausea.  Genitourinary: Negative for frequency and hematuria.  Musculoskeletal: Positive for arthralgias and back pain. Negative for gait problem, joint swelling and neck pain.  Skin: Positive for rash.  Neurological: Negative for dizziness, tremors, speech difficulty and weakness.  Psychiatric/Behavioral: Negative for agitation, dysphoric mood, sleep disturbance and suicidal ideas. The patient is not nervous/anxious.     Objective:  BP 140/88 (BP Location: Left Arm, Patient Position: Sitting, Cuff Size: Normal)   Pulse 87   Temp 98.3 F (36.8 C) (Oral)   Ht 6' (1.829 m)   Wt 197 lb (89.4 kg)   SpO2 96%   BMI 26.72 kg/m   BP Readings from Last 3 Encounters:  11/19/18 140/88  02/12/18 (!) 144/86  11/06/17 138/84    Wt Readings from Last 3 Encounters:  11/19/18 197 lb (89.4 kg)  02/12/18 204 lb (92.5 kg)  11/06/17 201 lb (91.2 kg)    Physical Exam Constitutional:      General: He is not in acute distress.    Appearance: He is well-developed.     Comments: NAD  Eyes:     Conjunctiva/sclera: Conjunctivae normal.     Pupils: Pupils are equal, round, and reactive to light.  Neck:  Musculoskeletal: Normal range of motion.     Thyroid: No thyromegaly.     Vascular: No JVD.  Cardiovascular:     Rate and Rhythm: Normal rate and regular rhythm.     Heart sounds: Normal heart sounds. No murmur. No friction rub. No gallop.   Pulmonary:     Effort: Pulmonary effort is normal. No respiratory distress.     Breath sounds: Normal breath sounds. No wheezing or rales.  Chest:     Chest wall: No tenderness.  Abdominal:     General: Bowel sounds are normal. There is no distension.     Palpations: Abdomen is soft. There is no mass.     Tenderness: There is no abdominal  tenderness. There is no guarding or rebound.  Musculoskeletal: Normal range of motion.        General: Tenderness present.  Lymphadenopathy:     Cervical: No cervical adenopathy.  Skin:    General: Skin is warm and dry.     Findings: No rash.  Neurological:     Mental Status: He is alert and oriented to person, place, and time.     Cranial Nerves: No cranial nerve deficit.     Motor: No abnormal muscle tone.     Coordination: Coordination normal.     Gait: Gait normal.     Deep Tendon Reflexes: Reflexes are normal and symmetric.  Psychiatric:        Behavior: Behavior normal.        Thought Content: Thought content normal.        Judgment: Judgment normal.   Intertrigo L>>R eryth papules - legs, feet, buttocks LS tender  Lab Results  Component Value Date   WBC 8.8 09/04/2017   HGB 15.8 09/04/2017   HCT 46.4 09/04/2017   PLT 309.0 09/04/2017   GLUCOSE 101 (H) 09/04/2017   CHOL 135 09/05/2016   TRIG 220.0 (H) 09/05/2016   HDL 33.00 (L) 09/05/2016   LDLDIRECT 67.0 09/05/2016   LDLCALC 98 01/04/2014   ALT 25 09/04/2017   AST 32 09/04/2017   NA 138 09/04/2017   K 3.9 09/04/2017   CL 103 09/04/2017   CREATININE 1.23 09/04/2017   BUN 27 (H) 09/04/2017   CO2 29 09/04/2017   TSH 2.06 09/04/2017   PSA 0.20 05/30/2016    Ct Cardiac Scoring  Addendum Date: 03/21/2018   ADDENDUM REPORT: 03/21/2018 09:56 CLINICAL DATA:  Risk stratification EXAM: Coronary Calcium Score TECHNIQUE: The patient was scanned on a [employer] scanner. Axial non-contrast 3 mm slices were carried out through the heart. The data set was analyzed on a dedicated work station and scored using the [place]. FINDINGS: Non-cardiac: See separate report from [facility]. Ascending Aorta: Upper normal size of the ascending aorta for age/sex measuring 40 mm in the axial views. Pericardium: Normal. Coronary arteries: Normal origin. IMPRESSION: 1. Coronary calcium score of 8. This was 66 percentile for  age and sex matched control. Electronically Signed   By: Ena Dawley   On: 03/21/2018 09:56   Result Date: 03/21/2018 EXAM: OVER-READ INTERPRETATION  CT CHEST The following report is an over-read performed by radiologist Dr. Vinnie Langton of [facility], [place] on 03/21/2018. This over-read does not include interpretation of cardiac or coronary anatomy or pathology. The coronary calcium score interpretation by the cardiologist is attached. COMPARISON:  Chest CT 04/01/2014. FINDINGS: Within the visualized portions of the thorax there are no suspicious appearing pulmonary nodules or masses, there is no acute consolidative airspace  disease, no pleural effusions, no pneumothorax and no lymphadenopathy. Visualized portions of the upper abdomen are unremarkable. There are no aggressive appearing lytic or blastic lesions noted in the visualized portions of the skeleton. IMPRESSION: No significant incidental noncardiac findings are noted. Electronically Signed: By: Vinnie Langton M.D. On: 03/21/2018 09:13    Assessment & Plan:   There are no diagnoses linked to this encounter.   No orders of the defined types were placed in this encounter.    Follow-up: No follow-ups on file.  Paul Kehr, MD

## 2018-11-19 NOTE — Assessment & Plan Note (Signed)
[name] 

## 2018-11-19 NOTE — Assessment & Plan Note (Signed)
Recurrent cough - Protonix

## 2018-11-19 NOTE — Assessment & Plan Note (Signed)
Groin - Rx Lotrisone Briefs

## 2018-11-19 NOTE — Assessment & Plan Note (Signed)
-  

## 2018-11-19 NOTE — Addendum Note (Signed)
Addended by: Karren Cobble on: 11/19/2018 01:26 PM   Modules accepted: Orders

## 2018-11-19 NOTE — Assessment & Plan Note (Signed)
?  chiggers vs contact dermatitis Triamc cream Medrol pack

## 2018-12-07 ENCOUNTER — Other Ambulatory Visit: Payer: Self-pay | Admitting: Internal Medicine

## 2018-12-07 DIAGNOSIS — R319 Hematuria, unspecified: Secondary | ICD-10-CM

## 2018-12-09 ENCOUNTER — Other Ambulatory Visit (INDEPENDENT_AMBULATORY_CARE_PROVIDER_SITE_OTHER): Payer: 59

## 2018-12-09 DIAGNOSIS — R319 Hematuria, unspecified: Secondary | ICD-10-CM

## 2018-12-10 ENCOUNTER — Other Ambulatory Visit: Payer: Self-pay

## 2018-12-10 ENCOUNTER — Encounter: Payer: Self-pay | Admitting: Internal Medicine

## 2018-12-10 ENCOUNTER — Inpatient Hospital Stay: Admission: RE | Admit: 2018-12-10 | Payer: 59 | Source: Ambulatory Visit

## 2018-12-10 ENCOUNTER — Ambulatory Visit (INDEPENDENT_AMBULATORY_CARE_PROVIDER_SITE_OTHER): Payer: 59 | Admitting: Internal Medicine

## 2018-12-10 DIAGNOSIS — K5900 Constipation, unspecified: Secondary | ICD-10-CM | POA: Diagnosis not present

## 2018-12-10 DIAGNOSIS — R310 Gross hematuria: Secondary | ICD-10-CM | POA: Diagnosis not present

## 2018-12-10 LAB — URINALYSIS, ROUTINE W REFLEX MICROSCOPIC
Bilirubin Urine: NEGATIVE
Ketones, ur: NEGATIVE
Leukocytes,Ua: NEGATIVE
Nitrite: NEGATIVE
Specific Gravity, Urine: 1.02 (ref 1.000–1.030)
Total Protein, Urine: NEGATIVE
Urine Glucose: NEGATIVE
Urobilinogen, UA: 0.2 (ref 0.0–1.0)
pH: 6 (ref 5.0–8.0)

## 2018-12-10 MED ORDER — KETOROLAC TROMETHAMINE 10 MG PO TABS: 10.0000 mg | ORAL_TABLET | Freq: Four times a day (QID) | ORAL | 0 refills | Status: DC | PRN

## 2018-12-10 MED ORDER — TAMSULOSIN HCL 0.4 MG PO CAPS: 0.4000 mg | ORAL_CAPSULE | Freq: Every day | ORAL | 3 refills | Status: DC

## 2018-12-10 MED ORDER — LINACLOTIDE 145 MCG PO CAPS: 145.0000 ug | ORAL_CAPSULE | ORAL | 1 refills | Status: DC

## 2018-12-10 MED ORDER — CEFUROXIME AXETIL 500 MG PO TABS: 500.0000 mg | ORAL_TABLET | Freq: Two times a day (BID) | ORAL | 0 refills | Status: DC

## 2018-12-10 NOTE — Addendum Note (Signed)
Addended by: Karren Cobble on: 12/10/2018 02:15 PM   Modules accepted: Orders

## 2018-12-10 NOTE — Progress Notes (Signed)
Subjective:  Patient ID: Paul [name]., male    DOB: October 26, 1945  Age: 73 y.o. MRN: [id number]  CC: No chief complaint on file.   HPI Paul [name]. presents for blood in the urine episode on Monday, then resolved. He became constipated and became bloated after mag citrate. Enema did not help. No fever. C/o B back pain over the weekend. Last BM - Sunday.   Outpatient Medications Prior to Visit  Medication Sig Dispense Refill  . acetaminophen-codeine (TYLENOL #4) 300-60 MG tablet Take 1 tablet by mouth every 12 (twelve) hours as needed. for pain 60 tablet 0  . amLODipine-olmesartan ([name]) 5-40 MG tablet Take 1 tablet by mouth daily. 90 tablet 3  . atorvastatin (LIPITOR) 10 MG tablet Take 1 tablet (10 mg total) by mouth daily. 90 tablet 3  . buPROPion (WELLBUTRIN XL) 300 MG 24 hr tablet Take 1 tablet (300 mg total) by mouth daily. 90 tablet 3  . Cholecalciferol 1000 UNITS tablet Take 1,000 Units by mouth daily.      . clotrimazole-betamethasone (LOTRISONE) cream Apply 1 application topically 2 (two) times daily. 45 g 2  . fenofibrate (TRICOR) 48 MG tablet Take 1 tablet (48 mg total) by mouth daily. 90 tablet 2  . finasteride (PROSCAR) 5 MG tablet Take 1 tablet (5 mg total) by mouth daily. 90 tablet 3  . fluticasone furoate-vilanterol (BREO ELLIPTA) 100-25 MCG/INH AEPB Inhale 1 puff into the lungs daily. 1 each 5  . loratadine (CLARITIN) 10 MG tablet Take 1 tablet (10 mg total) by mouth daily. 100 tablet 3  . methylPREDNISolone (MEDROL DOSEPAK) 4 MG TBPK tablet As directed 21 tablet 0  . pantoprazole (PROTONIX) 40 MG tablet Take 1 tablet (40 mg total) by mouth daily. 90 tablet 1  . sildenafil (VIAGRA) 100 MG tablet Take 1 tablet (100 mg total) by mouth daily as needed for erectile dysfunction. 12 tablet 3  . triamcinolone cream (KENALOG) 0.5 % Apply 1 application topically 3 (three) times daily. 60 g 1  . triamterene-hydrochlorothiazide (MAXZIDE-25) 37.5-25 MG tablet Take 1 tablet  by mouth daily. 90 tablet 3  . zolpidem (AMBIEN) 10 MG tablet Take 1 tablet (10 mg total) by mouth at bedtime as needed for sleep. 90 tablet 1   No facility-administered medications prior to visit.     ROS: Review of Systems  Constitutional: Negative for appetite change, fatigue and unexpected weight change.  HENT: Negative for congestion, nosebleeds, sneezing, sore throat and trouble swallowing.   Eyes: Negative for itching and visual disturbance.  Respiratory: Negative for cough.   Cardiovascular: Negative for chest pain, palpitations and leg swelling.  Gastrointestinal: Positive for constipation. Negative for abdominal distention, abdominal pain, blood in stool, diarrhea, nausea and vomiting.  Genitourinary: Positive for difficulty urinating. Negative for decreased urine volume, discharge, dysuria, enuresis, frequency and hematuria.  Musculoskeletal: Negative for back pain, gait problem, joint swelling and neck pain.  Skin: Negative for rash.  Neurological: Negative for dizziness, tremors, speech difficulty and weakness.  Psychiatric/Behavioral: Negative for agitation, dysphoric mood, sleep disturbance and suicidal ideas. The patient is not nervous/anxious.     Objective:  BP 140/80 (BP Location: Left Arm, Patient Position: Sitting, Cuff Size: Normal)   Pulse 69   Temp (!) 97.5 F (36.4 C) (Oral)   Ht 6' (1.829 m)   Wt 196 lb (88.9 kg)   SpO2 96%   BMI 26.58 kg/m   BP Readings from Last 3 Encounters:  12/10/18 140/80  11/19/18 140/88  02/12/18 (!) 144/86    Wt Readings from Last 3 Encounters:  12/10/18 196 lb (88.9 kg)  11/19/18 197 lb (89.4 kg)  02/12/18 204 lb (92.5 kg)    Physical Exam Constitutional:      General: He is not in acute distress.    Appearance: Normal appearance. He is well-developed.     Comments: NAD  Eyes:     Conjunctiva/sclera: Conjunctivae normal.     Pupils: Pupils are equal, round, and reactive to light.  Neck:     Musculoskeletal:  Normal range of motion.     Thyroid: No thyromegaly.     Vascular: No JVD.  Cardiovascular:     Rate and Rhythm: Normal rate and regular rhythm.     Heart sounds: Normal heart sounds. No murmur. No friction rub. No gallop.   Pulmonary:     Effort: Pulmonary effort is normal. No respiratory distress.     Breath sounds: Normal breath sounds. No wheezing or rales.  Chest:     Chest wall: No tenderness.  Abdominal:     General: Bowel sounds are normal. There is no distension.     Palpations: Abdomen is soft. There is no mass.     Tenderness: There is no abdominal tenderness. There is no right CVA tenderness, left CVA tenderness, guarding or rebound.     Hernia: No hernia is present.  Musculoskeletal: Normal range of motion.        General: No tenderness.  Lymphadenopathy:     Cervical: No cervical adenopathy.  Skin:    General: Skin is warm and dry.     Findings: No rash.  Neurological:     Mental Status: He is alert and oriented to person, place, and time.     Cranial Nerves: No cranial nerve deficit.     Motor: No abnormal muscle tone.     Coordination: Coordination normal.     Gait: Gait normal.     Deep Tendon Reflexes: Reflexes are normal and symmetric.  Psychiatric:        Behavior: Behavior normal.        Thought Content: Thought content normal.        Judgment: Judgment normal.   abd S/NT  Lab Results  Component Value Date   WBC 8.8 09/04/2017   HGB 15.8 09/04/2017   HCT 46.4 09/04/2017   PLT 309.0 09/04/2017   GLUCOSE 101 (H) 09/04/2017   CHOL 135 09/05/2016   TRIG 220.0 (H) 09/05/2016   HDL 33.00 (L) 09/05/2016   LDLDIRECT 67.0 09/05/2016   LDLCALC 98 01/04/2014   ALT 25 09/04/2017   AST 32 09/04/2017   NA 138 09/04/2017   K 3.9 09/04/2017   CL 103 09/04/2017   CREATININE 1.23 09/04/2017   BUN 27 (H) 09/04/2017   CO2 29 09/04/2017   TSH 2.06 09/04/2017   PSA 0.20 05/30/2016    Ct Cardiac Scoring  Addendum Date: 03/21/2018   ADDENDUM REPORT:  03/21/2018 09:56 CLINICAL DATA:  Risk stratification EXAM: Coronary Calcium Score TECHNIQUE: The patient was scanned on a [employer] scanner. Axial non-contrast 3 mm slices were carried out through the heart. The data set was analyzed on a dedicated work station and scored using the [place]. FINDINGS: Non-cardiac: See separate report from [facility]. Ascending Aorta: Upper normal size of the ascending aorta for age/sex measuring 40 mm in the axial views. Pericardium: Normal. Coronary arteries: Normal origin. IMPRESSION: 1. Coronary calcium score of 8. This was 14 percentile for  age and sex matched control. Electronically Signed   By: Ena Dawley   On: 03/21/2018 09:56   Result Date: 03/21/2018 EXAM: OVER-READ INTERPRETATION  CT CHEST The following report is an over-read performed by radiologist Dr. Vinnie Langton of [facility], [place] on 03/21/2018. This over-read does not include interpretation of cardiac or coronary anatomy or pathology. The coronary calcium score interpretation by the cardiologist is attached. COMPARISON:  Chest CT 04/01/2014. FINDINGS: Within the visualized portions of the thorax there are no suspicious appearing pulmonary nodules or masses, there is no acute consolidative airspace disease, no pleural effusions, no pneumothorax and no lymphadenopathy. Visualized portions of the upper abdomen are unremarkable. There are no aggressive appearing lytic or blastic lesions noted in the visualized portions of the skeleton. IMPRESSION: No significant incidental noncardiac findings are noted. Electronically Signed: By: Vinnie Langton M.D. On: 03/21/2018 09:13    Assessment & Plan:   There are no diagnoses linked to this encounter.   No orders of the defined types were placed in this encounter.    Follow-up: No follow-ups on file.  Walker Kehr, MD

## 2018-12-10 NOTE — Addendum Note (Signed)
Addended by: Karren Cobble on: 12/10/2018 02:10 PM   Modules accepted: Orders

## 2018-12-10 NOTE — Assessment & Plan Note (Signed)
H/o bladder stone - w/u by Urology in [place] 6 years ago CT renal Urine cx Flomax, Toradol Ceftin if fever To ER if worse

## 2018-12-10 NOTE — Assessment & Plan Note (Signed)
New No SBO CT tomorrow Linz[place] prn

## 2018-12-10 NOTE — Telephone Encounter (Signed)
Pt being worked in today

## 2018-12-11 ENCOUNTER — Telehealth: Payer: Self-pay | Admitting: Internal Medicine

## 2018-12-11 ENCOUNTER — Ambulatory Visit
Admission: RE | Admit: 2018-12-11 | Discharge: 2018-12-11 | Disposition: A | Discharge status: Home / Self Care | Payer: 59 | Source: Ambulatory Visit | Attending: Internal Medicine | Admitting: Internal Medicine

## 2018-12-11 ENCOUNTER — Other Ambulatory Visit: Payer: Self-pay | Admitting: Internal Medicine

## 2018-12-11 DIAGNOSIS — R310 Gross hematuria: Secondary | ICD-10-CM

## 2018-12-11 DIAGNOSIS — R1900 Intra-abdominal and pelvic swelling, mass and lump, unspecified site: Secondary | ICD-10-CM

## 2018-12-11 LAB — CULTURE, URINE COMPREHENSIVE
MICRO NUMBER:: 934162
RESULT:: NO GROWTH
SPECIMEN QUALITY:: ADEQUATE

## 2018-12-11 NOTE — Telephone Encounter (Signed)
'  Raquel Sarna' from [facility] calling to alert Dr. Alain Marion of CT results, visible in Epic.  TN called to practice, [place], to inform practice results available for review.  IMPRESSION: Mild left hydronephrosis is noted secondary to 15 mm calculus at left ureteropelvic junction. 2 nonobstructive left renal calculi are noted as well.  4.1 cm fat containing well-circumscribed lesion is noted in the mesentery of the left upper quadrant with small peripheral solid component. The possibility of liposarcoma cannot be excluded and further evaluation with MRI with and without gadolinium is recommended.

## 2018-12-15 ENCOUNTER — Encounter (HOSPITAL_COMMUNITY): Payer: Self-pay | Admitting: General Practice

## 2018-12-15 ENCOUNTER — Other Ambulatory Visit: Payer: Self-pay | Admitting: Urology

## 2018-12-18 ENCOUNTER — Other Ambulatory Visit: Payer: Self-pay

## 2018-12-18 ENCOUNTER — Ambulatory Visit (HOSPITAL_COMMUNITY)
Admission: RE | Admit: 2018-12-18 | Discharge: 2018-12-18 | Disposition: A | Discharge status: Home / Self Care | Payer: 59 | Source: Ambulatory Visit | Attending: Urology | Admitting: Urology

## 2018-12-18 ENCOUNTER — Encounter (HOSPITAL_COMMUNITY): Payer: Self-pay | Admitting: *Deleted

## 2018-12-18 ENCOUNTER — Ambulatory Visit (HOSPITAL_COMMUNITY): Payer: 59

## 2018-12-18 ENCOUNTER — Encounter (HOSPITAL_COMMUNITY)
Admission: RE | Disposition: A | Discharge status: Home / Self Care | Payer: Self-pay | Source: Ambulatory Visit | Attending: Urology

## 2018-12-18 ENCOUNTER — Telehealth (HOSPITAL_COMMUNITY): Payer: Self-pay | Admitting: *Deleted

## 2018-12-18 DIAGNOSIS — E785 Hyperlipidemia, unspecified: Secondary | ICD-10-CM | POA: Insufficient documentation

## 2018-12-18 DIAGNOSIS — K219 Gastro-esophageal reflux disease without esophagitis: Secondary | ICD-10-CM | POA: Insufficient documentation

## 2018-12-18 DIAGNOSIS — Z87442 Personal history of urinary calculi: Secondary | ICD-10-CM | POA: Insufficient documentation

## 2018-12-18 DIAGNOSIS — N400 Enlarged prostate without lower urinary tract symptoms: Secondary | ICD-10-CM | POA: Diagnosis not present

## 2018-12-18 DIAGNOSIS — F329 Major depressive disorder, single episode, unspecified: Secondary | ICD-10-CM | POA: Insufficient documentation

## 2018-12-18 DIAGNOSIS — Z87891 Personal history of nicotine dependence: Secondary | ICD-10-CM | POA: Diagnosis not present

## 2018-12-18 DIAGNOSIS — N200 Calculus of kidney: Secondary | ICD-10-CM | POA: Diagnosis present

## 2018-12-18 DIAGNOSIS — I10 Essential (primary) hypertension: Secondary | ICD-10-CM | POA: Insufficient documentation

## 2018-12-18 DIAGNOSIS — Z885 Allergy status to narcotic agent status: Secondary | ICD-10-CM | POA: Insufficient documentation

## 2018-12-18 DIAGNOSIS — N202 Calculus of kidney with calculus of ureter: Secondary | ICD-10-CM | POA: Diagnosis not present

## 2018-12-18 DIAGNOSIS — Z888 Allergy status to other drugs, medicaments and biological substances status: Secondary | ICD-10-CM | POA: Diagnosis not present

## 2018-12-18 DIAGNOSIS — F419 Anxiety disorder, unspecified: Secondary | ICD-10-CM | POA: Insufficient documentation

## 2018-12-18 DIAGNOSIS — N201 Calculus of ureter: Secondary | ICD-10-CM

## 2018-12-18 HISTORY — DX: Essential (primary) hypertension: I10

## 2018-12-18 HISTORY — DX: Personal history of urinary calculi: Z87.442

## 2018-12-18 HISTORY — PX: EXTRACORPOREAL SHOCK WAVE LITHOTRIPSY: SHX1557

## 2018-12-18 SURGERY — LITHOTRIPSY, ESWL
Anesthesia: LOCAL | Laterality: Left

## 2018-12-18 MED ORDER — SODIUM CHLORIDE 0.9 % IV SOLN
INTRAVENOUS | Status: DC
  Administered 2018-12-18: 09:00:00 via INTRAVENOUS

## 2018-12-18 MED ORDER — CIPROFLOXACIN HCL 500 MG PO TABS
500.0000 mg | ORAL_TABLET | ORAL | Status: AC
  Administered 2018-12-18: 10:00:00 500 mg via ORAL
  Filled 2018-12-18: qty 1

## 2018-12-18 MED ORDER — HYDROCODONE-ACETAMINOPHEN 5-325 MG PO TABS: 1.0000 | ORAL_TABLET | ORAL | 0 refills | Status: DC | PRN

## 2018-12-18 MED ORDER — DIAZEPAM 5 MG PO TABS
10.0000 mg | ORAL_TABLET | ORAL | Status: AC
  Administered 2018-12-18: 10 mg via ORAL
  Filled 2018-12-18: qty 2

## 2018-12-18 MED ORDER — DIPHENHYDRAMINE HCL 25 MG PO CAPS
25.0000 mg | ORAL_CAPSULE | ORAL | Status: AC
  Administered 2018-12-18: 10:00:00 25 mg via ORAL
  Filled 2018-12-18: qty 1

## 2018-12-18 NOTE — Op Note (Signed)
[facility] Operative Note  Treating Physician: Ellison Hughs, MD  Pre-op diagnosis: 15 mm left renal stone  Post-op diagnosis: Same   Procedure: LEFT ESWL  See Aris Everts OP note scanned into chart. Also because of the size, density, location and other factors that cannot be anticipated I feel this will likely be a staged procedure. This fact supersedes any indication in the scanned [place] stone operative note to the contrary
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
## 2018-12-19 ENCOUNTER — Encounter (HOSPITAL_COMMUNITY): Payer: Self-pay | Admitting: Urology

## 2018-12-28 ENCOUNTER — Ambulatory Visit
Admission: RE | Admit: 2018-12-28 | Discharge: 2018-12-28 | Disposition: A | Discharge status: Home / Self Care | Payer: 59 | Source: Ambulatory Visit | Attending: Internal Medicine | Admitting: Internal Medicine

## 2018-12-28 ENCOUNTER — Other Ambulatory Visit: Payer: Self-pay

## 2018-12-28 DIAGNOSIS — R1900 Intra-abdominal and pelvic swelling, mass and lump, unspecified site: Secondary | ICD-10-CM

## 2018-12-28 MED ORDER — GADOBENATE DIMEGLUMINE 529 MG/ML IV SOLN
18.0000 mL | Freq: Once | INTRAVENOUS | Status: AC | PRN
  Administered 2018-12-28: 10:00:00 18 mL via INTRAVENOUS

## 2018-12-29 ENCOUNTER — Telehealth: Payer: Self-pay

## 2018-12-29 NOTE — Telephone Encounter (Signed)
Pt had done yesterday, please advise in Dr. Enis Slipper absence.

## 2018-12-29 NOTE — Telephone Encounter (Signed)
There is no acute problem found on the MRI abdomen yesterday  There was a benign lipoma spot which does not require further evaluation, but no suspicion for malignancy or other new problem

## 2018-12-29 NOTE — Telephone Encounter (Signed)
Copied from [place] [id number]. Topic: Quick Communication - Other Results (Clinic Use ONLY) >> Dec 29, 2018  2:43 PM Pauline Good wrote: Pt want to know results of his MRI

## 2018-12-30 NOTE — Telephone Encounter (Signed)
Result sent through [facility]

## 2019-01-07 ENCOUNTER — Other Ambulatory Visit: Payer: Self-pay

## 2019-01-07 MED ORDER — ACETAMINOPHEN-CODEINE 300-60 MG PO TABS: 1.0000 | ORAL_TABLET | Freq: Two times a day (BID) | ORAL | 1 refills | Status: DC | PRN

## 2019-01-30 ENCOUNTER — Other Ambulatory Visit: Payer: Self-pay | Admitting: Urology

## 2019-02-03 ENCOUNTER — Other Ambulatory Visit: Payer: Self-pay

## 2019-02-03 ENCOUNTER — Encounter (HOSPITAL_BASED_OUTPATIENT_CLINIC_OR_DEPARTMENT_OTHER): Payer: Self-pay | Admitting: *Deleted

## 2019-02-03 NOTE — Progress Notes (Addendum)
Spoke w/ via phone for pre-op interview---[facility] needs dos----      I stat 8, ekg         Lab results------ct cardiac scoring 03-21-18 chart/epic COVID test ------02-06-2019  Arrive at -------530 am 02-10-2019 NPO after ------midnight Medications to take morning of surgery -----hydrocodoneprn, wellbutrin, atorvastatin, fenifibrate, pantaprazole, finasteride Diabetic medication -----n/a Patient Special Instructions ----- Pre-Op special Istructions ----- Patient verbalized understanding of instructions that were given at this phone interview. Patient denies shortness of breath, chest pain, fever, cough a this phone interview.

## 2019-02-06 ENCOUNTER — Other Ambulatory Visit (HOSPITAL_COMMUNITY)
Admission: RE | Admit: 2019-02-06 | Discharge: 2019-02-06 | Disposition: A | Discharge status: Home / Self Care | Payer: 59 | Source: Ambulatory Visit | Attending: Urology | Admitting: Urology

## 2019-02-06 DIAGNOSIS — Z01812 Encounter for preprocedural laboratory examination: Secondary | ICD-10-CM | POA: Insufficient documentation

## 2019-02-06 DIAGNOSIS — Z20828 Contact with and (suspected) exposure to other viral communicable diseases: Secondary | ICD-10-CM | POA: Insufficient documentation

## 2019-02-06 LAB — SARS CORONAVIRUS 2 (TAT 6-24 HRS): SARS Coronavirus 2: NEGATIVE

## 2019-02-09 NOTE — H&P (Signed)
CC: I have had kidney stone surgery.  HPI: Paul [name] is a 73 year-old male established patient who is here for renal calculi after a surgical intervention.  01/01/2019: Here today for postoperative ESWL appointment. He had shockwave therapy for a 15 mm left proximal ureteral calculi. He tolerated the procedure well. Since the procedure is past a couple of small stone fragments without any significant discomfort. Denies any bothersome left-sided pain or discomfort. Grossly voiding at his baseline without changes in frequency/urgency, force of stream. Denies any current burning or painful urination, visible blood in the urine. Denies any postoperative fevers or chills, nausea/vomiting.   01/29/2019: He returns, no interval stone material passage. Still having some occasional non-bothersome left CVA discomfort that is non-radiating. Denies bothersome LUTS. No changes in FOS, increased frequency/urgency, dysuria or visible blood in the urine. Denies interval f/c, n/v.   The stone was on the left side. He had ESWL for treatment of his renal calculi. Patient denies Stent, Ureteroscopy, and PCNL. This procedure was done 12/18/2018. He did pass a stone since the last office visit . This is not his first kidney stone. He does not have a stent in place.   He is not currently having flank pain, back pain, groin pain, nausea, vomiting, fever or chills.   He does not have dysuria. He does not have urgency. He does not have frequency.     ALLERGIES: No Allergies    MEDICATIONS: Atorvastatin Calcium 10 mg tablet  Bupropion Hcl  Finasteride 5 MG Oral Tablet Oral  Pantoprazole Sodium  Triamterene-Hydrochlorothiazid     GU PSH: ESWL, Left - 12/18/2018       PSH Notes: Hernia Repair, Neck Surgery, Appendectomy   NON-GU PSH: Appendectomy - 2012 Appendectomy (laparoscopic) Hernia Repair - 2012 Neck Surgery     GU PMH: Renal calculus, She has 2 renal stones that will need monitoring in the  future. - 12/15/2018, Kidney stone on left side, - 2014 Renal cyst, He has a 12cm right renal cyst that is asymptomatic. - 12/15/2018, Renal cyst, acquired, right, - 2014 Ureteral calculus, He has a 39mm left proximal stone. I discussed URS, ESWL and PCNL. I will get him set up for ESWL and reviewed the risks. I reviewed the risks of ESWL including bleeding, infection, injury to the kidney or adjacent structures, failure to fragment the stone, need for ancillary procedures, thrombotic events, cardiac arrhythmias and sedation complications. - 12/15/2018 Bladder Stone, Bladder calculus - 2014 Other microscopic hematuria, Microscopic hematuria - 2014      PMH Notes:  1898-03-12 00:00:00 - Note: Normal Routine History And Physical Senior Citizen [id number])  2010-05-25 16:01:13 - Note: Neoplasm Of The Prostate Gland   NON-GU PMH: Personal history of nicotine dependence, Former Smoker - 2014 Personal history of other diseases of the circulatory system, History of hypertension - 2014 Personal history of other endocrine, nutritional and metabolic disease, History of hypercholesterolemia - 2014 Personal history of other infectious and parasitic diseases, History of hepatitis - 2014 GERD Hypercholesterolemia Hypertension Inflammatory liver disease, unspecified    FAMILY HISTORY: Colon Cancer - Father Father Deceased At Age80 ___ - Runs In Family Mother Deceased At Age 21 from diabetic complicati - Runs In Family   SOCIAL HISTORY: Marital Status: Married Preferred Language: English; Race: White Current Smoking Status: Patient has never smoked.   Tobacco Use Assessment Completed: Used Tobacco in last 30 days? Drinks 2 caffeinated drinks per day. Patient's occupation Brewing technologist for a [employer]  Notes: Alcohol Use, Occupation:, Marital History - Currently Married, Tobacco Use, Caffeine Use   REVIEW OF SYSTEMS:    GU Review Male:   Patient denies frequent urination, hard to postpone  urination, burning/ pain with urination, get up at night to urinate, leakage of urine, stream starts and stops, trouble starting your stream, have to strain to urinate , erection problems, and penile pain.  Gastrointestinal (Upper):   Patient denies vomiting, nausea, and indigestion/ heartburn.  Gastrointestinal (Lower):   Patient denies diarrhea and constipation.  Constitutional:   Patient denies fever, night sweats, weight loss, and fatigue.  Skin:   Patient denies skin rash/ lesion and itching.  Eyes:   Patient denies blurred vision and double vision.  Ears/ Nose/ Throat:   Patient denies sore throat and sinus problems.  Hematologic/Lymphatic:   Patient denies swollen glands and easy bruising.  Cardiovascular:   Patient denies leg swelling and chest pains.  Respiratory:   Patient denies cough and shortness of breath.  Endocrine:   Patient denies excessive thirst.  Musculoskeletal:   Patient reports back pain. Patient denies joint pain.  Neurological:   Patient denies headaches and dizziness.  Psychologic:   Patient denies depression and anxiety.   VITAL SIGNS:      01/29/2019 08:56 AM  BP 139/92 mmHg  Pulse 76 /min  Temperature 96.8 F / 36 C   MULTI-SYSTEM PHYSICAL EXAMINATION:    Constitutional: Well-nourished. No physical deformities. Normally developed. Good grooming.  Neck: Neck symmetrical, not swollen. Normal tracheal position.   Respiratory: No labored breathing, no use of accessory muscles.   Cardiovascular: Normal temperature, normal extremity pulses, no swelling, no varicosities.  Neurologic / Psychiatric: Oriented to time, oriented to place, oriented to person. No depression, no anxiety, no agitation.  Gastrointestinal: No mass, no tenderness, no rigidity, non obese abdomen.   Musculoskeletal: Normal gait and station of head and neck.     PAST DATA REVIEWED:  Source Of History:  Patient  Lab Test Review:   Stone Analysis  Records Review:   Previous Hospital Records,  Previous Patient Records  Urine Test Review:   Urinalysis  X-Ray Review: KUB: Reviewed Films. Discussed With Patient.  C.T. Stone Protocol: Reviewed Films. Reviewed Report.     05/25/10  PSA  Total PSA 0.82     01/29/19 01/29/19  Urinalysis  Urine Appearance Clear  Clear   Urine Color Amber  Amber   Urine Glucose Neg mg/dL Neg   Urine Bilirubin Neg mg/dL Neg   Urine Ketones Neg mg/dL Neg   Urine Specific Gravity 1.025  1.025   Urine Blood Neg ery/uL Neg   Urine pH 5.5  5.5   Urine Protein Neg mg/dL Neg   Urine Urobilinogen 0.2 mg/dL 0.2   Urine Nitrites Neg  Neg   Urine Leukocyte Esterase Neg leu/uL Neg    PROCEDURES:         KUB - 74018  A single view of the abdomen is obtained. KUB today grossly unchanged from previous imaging study. At least 3 stones noted in the left renal shadow. One mid polar, one lower pole, one parenchymal calculi. Some smaller punctate appearing fragments also identified. Previously identified left proximal ureteral calculi grossly remains unchanged lateral to L4 lumbar process. No other obvious ureteral calculi noted on today's exam. Stable-appearing pelvic phlebolith on the left and right side noted. Bowel gas and stool pattern appears prominent but nonobstructing. Stable degenerative changes noted along the lumbar spine.      Patient  confirmed No Neulasta OnPro Device.           Urinalysis - 81003 Dipstick Dipstick Cont'd  Color: Amber Bilirubin: Neg  Appearance: Clear Ketones: Neg  Specific Gravity: 1.025 Blood: Neg  pH: 5.5 Protein: Neg  Glucose: Neg Urobilinogen: 0.2    Nitrites: Neg    Leukocyte Esterase: Neg    Notes:      ASSESSMENT:      ICD-10 Details  1 GU:   Renal and ureteral calculus - N20.2    PLAN:           Orders Labs Urine Culture          Schedule Return Visit/Planned Activity: Other See Visit Notes - Follow up MD, Schedule Surgery          Document Letter(s):  Created for Patient: Clinical Summary          Notes:   Still having some occasional left-sided CVA pain but not overly bothersome to the patient that he would require the use of pain medication. Grossly voiding at his at his baseline.  Imaging study today does demonstrate the presence of the remaining proximal ureteral calculi as well as a few stones within the left kidney.  Discussed likely treatment scenarios specifically repeating ESWL and Ureteroscopy with plan for OV and removal of stent after confirmation from review of operative note and/or KUB stone is no longer found to be a signficant risk to cause obstruction.  We did discuss the complications of shockwave lithotripsy including need for additional procedures, hematoma, obstructing fragments, and ongoing pain. I have reviewed the risks of ureteroscopy with the patient including bleeding, infection, ureteral injury, need for a stent or secondary procedures, thrombotic events and anesthetic complications.  I'll discuss this further with his urologist and provide treatment recommendation moving forward. Return precautions discussed for worsening symptomology including pain/discomfort and worsening lower urinary tract symptoms, correlating fevers/chills and other constitutional signs or symptoms of infection. Precautionary urine culture sent today. I'll contact him once I have discussed with Dr. Jeffie Pollock.

## 2019-02-09 NOTE — Anesthesia Preprocedure Evaluation (Addendum)
Anesthesia Evaluation  Patient identified by MRN, date of birth, ID band Patient awake    Reviewed: Allergy & Precautions, NPO status , Patient's Chart, lab work & pertinent test results  Airway Mallampati: II  TM Distance: >3 FB Neck ROM: Full    Dental no notable dental hx.    Pulmonary asthma , former smoker,  Asthma as a child   Pulmonary exam normal breath sounds clear to auscultation       Cardiovascular hypertension, Pt. on medications Normal cardiovascular exam Rhythm:Regular Rate:Normal     Neuro/Psych  Headaches, PSYCHIATRIC DISORDERS Anxiety Depression    GI/Hepatic GERD  Medicated and Controlled,(+) Hepatitis -, C  Endo/Other  negative endocrine ROS  Renal/GU nephrolithiasis   BPH    Musculoskeletal negative musculoskeletal ROS (+)   Abdominal Normal abdominal exam  (+)   Peds  Hematology negative hematology ROS (+)   Anesthesia Other Findings HLD   Reproductive/Obstetrics negative OB ROS                           Anesthesia Physical Anesthesia Plan  ASA: III  Anesthesia Plan: General   Post-op Pain Management:    Induction: Intravenous  PONV Risk Score and Plan: 2 and Ondansetron, Dexamethasone and Treatment may vary due to age or medical condition  Airway Management Planned: LMA  Additional Equipment: None  Intra-op Plan:   Post-operative Plan: Extubation in OR  Informed Consent: I have reviewed the patients History and Physical, chart, labs and discussed the procedure including the risks, benefits and alternatives for the proposed anesthesia with the patient or authorized representative who has indicated his/her understanding and acceptance.     Dental advisory given  Plan Discussed with: CRNA  Anesthesia Plan Comments:         Anesthesia Quick Evaluation

## 2019-02-10 ENCOUNTER — Ambulatory Visit (HOSPITAL_COMMUNITY): Payer: 59

## 2019-02-10 ENCOUNTER — Ambulatory Visit (HOSPITAL_BASED_OUTPATIENT_CLINIC_OR_DEPARTMENT_OTHER): Payer: 59 | Admitting: Anesthesiology

## 2019-02-10 ENCOUNTER — Encounter (HOSPITAL_BASED_OUTPATIENT_CLINIC_OR_DEPARTMENT_OTHER)
Admission: RE | Disposition: A | Discharge status: Home / Self Care | Payer: Self-pay | Source: Home / Self Care | Attending: Urology

## 2019-02-10 ENCOUNTER — Encounter (HOSPITAL_BASED_OUTPATIENT_CLINIC_OR_DEPARTMENT_OTHER): Payer: Self-pay

## 2019-02-10 ENCOUNTER — Ambulatory Visit (HOSPITAL_BASED_OUTPATIENT_CLINIC_OR_DEPARTMENT_OTHER)
Admission: RE | Admit: 2019-02-10 | Discharge: 2019-02-10 | Disposition: A | Discharge status: Home / Self Care | Payer: 59 | Attending: Urology | Admitting: Urology

## 2019-02-10 DIAGNOSIS — K219 Gastro-esophageal reflux disease without esophagitis: Secondary | ICD-10-CM | POA: Insufficient documentation

## 2019-02-10 DIAGNOSIS — B1920 Unspecified viral hepatitis C without hepatic coma: Secondary | ICD-10-CM | POA: Diagnosis not present

## 2019-02-10 DIAGNOSIS — E785 Hyperlipidemia, unspecified: Secondary | ICD-10-CM | POA: Insufficient documentation

## 2019-02-10 DIAGNOSIS — Z79899 Other long term (current) drug therapy: Secondary | ICD-10-CM | POA: Insufficient documentation

## 2019-02-10 DIAGNOSIS — I10 Essential (primary) hypertension: Secondary | ICD-10-CM | POA: Insufficient documentation

## 2019-02-10 DIAGNOSIS — N202 Calculus of kidney with calculus of ureter: Secondary | ICD-10-CM | POA: Insufficient documentation

## 2019-02-10 DIAGNOSIS — F419 Anxiety disorder, unspecified: Secondary | ICD-10-CM | POA: Insufficient documentation

## 2019-02-10 DIAGNOSIS — F988 Other specified behavioral and emotional disorders with onset usually occurring in childhood and adolescence: Secondary | ICD-10-CM | POA: Insufficient documentation

## 2019-02-10 DIAGNOSIS — F329 Major depressive disorder, single episode, unspecified: Secondary | ICD-10-CM | POA: Insufficient documentation

## 2019-02-10 DIAGNOSIS — N201 Calculus of ureter: Secondary | ICD-10-CM

## 2019-02-10 DIAGNOSIS — E7800 Pure hypercholesterolemia, unspecified: Secondary | ICD-10-CM | POA: Insufficient documentation

## 2019-02-10 DIAGNOSIS — Z981 Arthrodesis status: Secondary | ICD-10-CM | POA: Insufficient documentation

## 2019-02-10 DIAGNOSIS — Z87891 Personal history of nicotine dependence: Secondary | ICD-10-CM | POA: Diagnosis not present

## 2019-02-10 DIAGNOSIS — N400 Enlarged prostate without lower urinary tract symptoms: Secondary | ICD-10-CM | POA: Diagnosis not present

## 2019-02-10 DIAGNOSIS — N281 Cyst of kidney, acquired: Secondary | ICD-10-CM | POA: Insufficient documentation

## 2019-02-10 HISTORY — DX: Unspecified asthma, uncomplicated: J45.909

## 2019-02-10 HISTORY — PX: CYSTOSCOPY WITH RETROGRADE PYELOGRAM, URETEROSCOPY AND STENT PLACEMENT: SHX5789

## 2019-02-10 HISTORY — PX: HOLMIUM LASER APPLICATION: SHX5852

## 2019-02-10 LAB — POCT I-STAT, CHEM 8
BUN: 21 mg/dL (ref 8–23)
Calcium, Ion: 1.3 mmol/L (ref 1.15–1.40)
Chloride: 100 mmol/L (ref 98–111)
Creatinine, Ser: 0.8 mg/dL (ref 0.61–1.24)
Glucose, Bld: 105 mg/dL — ABNORMAL HIGH (ref 70–99)
HCT: 32 % — ABNORMAL LOW (ref 39.0–52.0)
Hemoglobin: 10.9 g/dL — ABNORMAL LOW (ref 13.0–17.0)
Potassium: 3.9 mmol/L (ref 3.5–5.1)
Sodium: 139 mmol/L (ref 135–145)
TCO2: 27 mmol/L (ref 22–32)

## 2019-02-10 SURGERY — CYSTOURETEROSCOPY, WITH RETROGRADE PYELOGRAM AND STENT INSERTION
Anesthesia: General | Site: Ureter

## 2019-02-10 MED ORDER — PHENAZOPYRIDINE HCL 200 MG PO TABS: 200.0000 mg | ORAL_TABLET | Freq: Three times a day (TID) | ORAL | 1 refills | Status: DC | PRN

## 2019-02-10 MED ORDER — EPHEDRINE SULFATE-NACL 50-0.9 MG/10ML-% IV SOSY
PREFILLED_SYRINGE | INTRAVENOUS | Status: DC | PRN
  Administered 2019-02-10: 10 mg via INTRAVENOUS

## 2019-02-10 MED ORDER — PROPOFOL 10 MG/ML IV BOLUS
INTRAVENOUS | Status: DC | PRN
  Administered 2019-02-10: 150 mg via INTRAVENOUS

## 2019-02-10 MED ORDER — PHENYLEPHRINE HCL (PRESSORS) 10 MG/ML IV SOLN
INTRAVENOUS | Status: DC | PRN
  Administered 2019-02-10: 80 ug via INTRAVENOUS

## 2019-02-10 MED ORDER — IOHEXOL 300 MG/ML  SOLN
INTRAMUSCULAR | Status: DC | PRN
  Administered 2019-02-10: 6 mL via URETHRAL

## 2019-02-10 MED ORDER — FENTANYL CITRATE (PF) 100 MCG/2ML IJ SOLN
INTRAMUSCULAR | Status: AC
  Filled 2019-02-10: qty 2

## 2019-02-10 MED ORDER — ONDANSETRON HCL 4 MG/2ML IJ SOLN
INTRAMUSCULAR | Status: AC
  Filled 2019-02-10: qty 2

## 2019-02-10 MED ORDER — SODIUM CHLORIDE 0.9% FLUSH
3.0000 mL | Freq: Two times a day (BID) | INTRAVENOUS | Status: DC
  Filled 2019-02-10: qty 3

## 2019-02-10 MED ORDER — DEXAMETHASONE SODIUM PHOSPHATE 10 MG/ML IJ SOLN
INTRAMUSCULAR | Status: AC
  Filled 2019-02-10: qty 1

## 2019-02-10 MED ORDER — FENTANYL CITRATE (PF) 100 MCG/2ML IJ SOLN
INTRAMUSCULAR | Status: DC | PRN
  Administered 2019-02-10: 75 ug via INTRAVENOUS
  Administered 2019-02-10: 25 ug via INTRAVENOUS

## 2019-02-10 MED ORDER — PHENYLEPHRINE 40 MCG/ML (10ML) SYRINGE FOR IV PUSH (FOR BLOOD PRESSURE SUPPORT)
PREFILLED_SYRINGE | INTRAVENOUS | Status: AC
  Filled 2019-02-10: qty 10

## 2019-02-10 MED ORDER — FENTANYL CITRATE (PF) 100 MCG/2ML IJ SOLN
25.0000 ug | INTRAMUSCULAR | Status: DC | PRN
  Filled 2019-02-10: qty 1

## 2019-02-10 MED ORDER — DEXAMETHASONE SODIUM PHOSPHATE 10 MG/ML IJ SOLN
INTRAMUSCULAR | Status: DC | PRN
  Administered 2019-02-10: 5 mg via INTRAVENOUS

## 2019-02-10 MED ORDER — CEFAZOLIN SODIUM-DEXTROSE 2-4 GM/100ML-% IV SOLN
2.0000 g | INTRAVENOUS | Status: AC
  Administered 2019-02-10: 07:00:00 2 g via INTRAVENOUS
  Filled 2019-02-10: qty 100

## 2019-02-10 MED ORDER — CEFAZOLIN SODIUM-DEXTROSE 2-4 GM/100ML-% IV SOLN
INTRAVENOUS | Status: AC
  Filled 2019-02-10: qty 100

## 2019-02-10 MED ORDER — KETOROLAC TROMETHAMINE 30 MG/ML IJ SOLN
INTRAMUSCULAR | Status: AC
  Filled 2019-02-10: qty 1

## 2019-02-10 MED ORDER — EPHEDRINE 5 MG/ML INJ
INTRAVENOUS | Status: AC
  Filled 2019-02-10: qty 10

## 2019-02-10 MED ORDER — HYDROCODONE-ACETAMINOPHEN 5-325 MG PO TABS: 1.0000 | ORAL_TABLET | Freq: Four times a day (QID) | ORAL | 0 refills | Status: DC | PRN

## 2019-02-10 MED ORDER — PROPOFOL 10 MG/ML IV BOLUS
INTRAVENOUS | Status: AC
  Filled 2019-02-10: qty 20

## 2019-02-10 MED ORDER — KETOROLAC TROMETHAMINE 30 MG/ML IJ SOLN
INTRAMUSCULAR | Status: DC | PRN
  Administered 2019-02-10: 30 mg via INTRAVENOUS

## 2019-02-10 MED ORDER — ONDANSETRON HCL 4 MG/2ML IJ SOLN
4.0000 mg | Freq: Once | INTRAMUSCULAR | Status: DC | PRN
  Filled 2019-02-10: qty 2

## 2019-02-10 MED ORDER — ONDANSETRON HCL 4 MG/2ML IJ SOLN
INTRAMUSCULAR | Status: DC | PRN
  Administered 2019-02-10: 4 mg via INTRAVENOUS

## 2019-02-10 MED ORDER — LIDOCAINE 2% (20 MG/ML) 5 ML SYRINGE
INTRAMUSCULAR | Status: AC
  Filled 2019-02-10: qty 5

## 2019-02-10 MED ORDER — ACETAMINOPHEN 500 MG PO TABS
ORAL_TABLET | ORAL | Status: AC
  Filled 2019-02-10: qty 2

## 2019-02-10 MED ORDER — SODIUM CHLORIDE 0.9 % IR SOLN
Status: DC | PRN
  Administered 2019-02-10: 3000 mL

## 2019-02-10 MED ORDER — ACETAMINOPHEN 500 MG PO TABS
1000.0000 mg | ORAL_TABLET | Freq: Once | ORAL | Status: AC
  Administered 2019-02-10: 1000 mg via ORAL
  Filled 2019-02-10: qty 2

## 2019-02-10 MED ORDER — LIDOCAINE 2% (20 MG/ML) 5 ML SYRINGE
INTRAMUSCULAR | Status: DC | PRN
  Administered 2019-02-10: 60 mg via INTRAVENOUS

## 2019-02-10 MED ORDER — LACTATED RINGERS IV SOLN
INTRAVENOUS | Status: DC
  Administered 2019-02-10 (×2): via INTRAVENOUS
  Filled 2019-02-10: qty 1000

## 2019-02-10 MED ORDER — WHITE PETROLATUM EX OINT
TOPICAL_OINTMENT | CUTANEOUS | Status: AC
  Filled 2019-02-10: qty 5

## 2019-02-10 SURGICAL SUPPLY — 27 items
BAG DRAIN URO-CYSTO SKYTR STRL (DRAIN) ×3 IMPLANT
BAG DRN UROCATH (DRAIN) ×2
BASKET STONE 1.7 NGAGE (UROLOGICAL SUPPLIES) ×3 IMPLANT
BASKET ZERO TIP NITINOL 2.4FR (BASKET) IMPLANT
BSKT STON RTRVL ZERO TP 2.4FR (BASKET)
CATH URET 5FR 28IN CONE TIP (BALLOONS)
CATH URET 5FR 28IN OPEN ENDED (CATHETERS) IMPLANT
CATH URET 5FR 70CM CONE TIP (BALLOONS) IMPLANT
CLOTH BEACON ORANGE TIMEOUT ST (SAFETY) ×3 IMPLANT
ELECT REM PT RETURN 9FT ADLT (ELECTROSURGICAL)
ELECTRODE REM PT RTRN 9FT ADLT (ELECTROSURGICAL) IMPLANT
FIBER LASER FLEXIVA 365 (UROLOGICAL SUPPLIES) IMPLANT
FIBER LASER TRAC TIP (UROLOGICAL SUPPLIES) ×2 IMPLANT
GLOVE SURG SS PI 8.0 STRL IVOR (GLOVE) ×3 IMPLANT
GOWN STRL REUS W/TWL XL LVL3 (GOWN DISPOSABLE) ×3 IMPLANT
GUIDEWIRE ANG ZIPWIRE 038X150 (WIRE) IMPLANT
GUIDEWIRE STR DUAL SENSOR (WIRE) ×3 IMPLANT
IV NS IRRIG 3000ML ARTHROMATIC (IV SOLUTION) ×3 IMPLANT
KIT TURNOVER CYSTO (KITS) ×3 IMPLANT
MANIFOLD NEPTUNE II (INSTRUMENTS) ×3 IMPLANT
NS IRRIG 500ML POUR BTL (IV SOLUTION) ×3 IMPLANT
PACK CYSTO (CUSTOM PROCEDURE TRAY) ×3 IMPLANT
SHEATH URETERAL 12FRX35CM (MISCELLANEOUS) ×1 IMPLANT
SHEATH URETERAL 12FRX55CM (UROLOGICAL SUPPLIES) ×3 IMPLANT
STENT URET 6FRX26 CONTOUR (STENTS) ×2 IMPLANT
TUBE CONNECTING 12X1/4 (SUCTIONS) ×3 IMPLANT
TUBING UROLOGY SET (TUBING) ×3 IMPLANT

## 2019-02-10 NOTE — Transfer of Care (Signed)
Immediate Anesthesia Transfer of Care Note  Patient: Paul [name].  Procedure(s) Performed: Procedure(s) (LRB): CYSTOSCOPY WITH LEFT RETROGRADE PYELOGRAM, LEFT URETEROSCOPY HOLMIUM LASER AND POSSIBLE STENT PLACEMENT (Left) HOLMIUM LASER APPLICATION (N/A)  Patient Location: PACU  Anesthesia Type: General  Level of Consciousness: awake, oriented, sedated and patient cooperative  Airway & Oxygen Therapy: Patient Spontanous Breathing and Patient connected to face mask oxygen  Post-op Assessment: Report given to PACU RN and Post -op Vital signs reviewed and stable  Post vital signs: Reviewed and stable  Complications: No apparent anesthesia complications Last Vitals:  Vitals Value Taken Time  BP    Temp    Pulse    Resp    SpO2      Last Pain:  Vitals:   02/10/19 0633  TempSrc: Oral  PainSc: 0-No pain      Patients Stated Pain Goal: 2 (02/10/19 [id number])

## 2019-02-10 NOTE — Anesthesia Postprocedure Evaluation (Signed)
Anesthesia Post Note  Patient: Paul [name].  Procedure(s) Performed: CYSTOSCOPY WITH LEFT RETROGRADE PYELOGRAM, LEFT URETEROSCOPY HOLMIUM LASER AND POSSIBLE STENT PLACEMENT (Left Ureter) HOLMIUM LASER APPLICATION (N/A Ureter)     Patient location during evaluation: PACU Anesthesia Type: General Level of consciousness: awake and alert, oriented and patient cooperative Pain management: pain level controlled Vital Signs Assessment: post-procedure vital signs reviewed and stable Respiratory status: spontaneous breathing, nonlabored ventilation and respiratory function stable Cardiovascular status: blood pressure returned to baseline and stable Postop Assessment: no apparent nausea or vomiting Anesthetic complications: no    Last Vitals:  Vitals:   02/10/19 0859 02/10/19 0900  BP:  131/85  Pulse: 68 64  Resp: 15 14  Temp:    SpO2: 96% 98%    Last Pain:  Vitals:   02/10/19 0859  TempSrc:   PainSc: 0-No pain                 Pervis Hocking

## 2019-02-10 NOTE — Op Note (Signed)
Procedure:.  1.  Cystoscopy with left retrograde pyelogram and interpretation. 2.  Left ureteroscopic stone extraction with holmium laser application and insertion of left double-J stent.   Preop diagnosis: Left proximal ureteral and left renal stones.  Postop diagnosis: Same.  Surgeon: Dr. Irine Seal.  Anesthesia: General.  Specimen: Stone fragments.  Drain: 6 [place] by 26 cm left contour double-J stent with tether.  EBL: None.  Complications: None.  Indications: The patient is a 73 year old male with history of left proximal ureteral stone was initially treated with lithotripsy.  He had residual proximal ureteral fragments and some fragments in the lower pole and is elected to undergo ureteroscopy to complete therapy.  Procedure: He was given 2 g of Ancef.  A general anesthetic was induced.  He was placed in lithotomy position and fitted with PAS hose.  His perineum and genitalia were prepped with Betadine solution and he was draped in usual sterile fashion.  Cystoscopy was performed using a 23 [place] scope and 30 degree lens.  Examination revealed a normal urethra.  The external sphincter was intact.  The prostatic urethra had evidence of prior TUR with a widely patent resection defect.  The bladder wall had mild trabeculation without tumors, stones or inflammation.  Ureteral orifices were unremarkable.  The left ureteral orifice was cannulated with a 5 French opening catheter and contrast was instilled.  Left retrograde pyelogram revealed a normal ureter up to the stone in the proximal ureter which was minimally obstructing there was a filling defect in the lower pole consistent with the other stone fragments.  A sensor guidewire was passed through the opening catheter to the kidney and the opening catheter was removed.  The cystoscope was then removed.  The inner core of a 55 cm digital access sheath was then passed easily to the level of the stone.  The 55 cm sheath was felt to be  too long so a 35 cm 12/14 assembled sheath was then passed.  The wire and inner core were removed.  The dual-lumen digital flexible ureteroscope was then passed and the stone was visualized in the proximal ureter.  A 200 m tract tip laser fiber was then passed and the stone was engaged but retropulsed into the renal pelvis.  The stone was further fragmented in this location and the fragments were removed with an engage basket.  I then removed several fragments from the lower pole with the engage basket and also fragmented the larger fragments with the laser prior to removal.  There was approximately a 6 mm stone in the medial midpole which I was unable to visualize with the ureteroscope despite the very determined effort and I feel the stone is in a calyceal diverticulum.  Final inspection of the intrarenal collecting system revealed no significant residual stone fragments.  The sensor wire was passed through the scope and the scope was removed while visually inspecting the ureter.  No significant ureteral injury was identified but it was felt the stent was indicated.  The cystoscope was reinserted over the wire and a 6 [place] by 26 cm contour double-J stent with tether was passed to the kidney under fluoroscopic guidance.  The wire was removed, leaving a good coil in the kidney and a good coil in the bladder.  The bladder was then partially drained and the cystoscope was removed leaving the stent string exiting urethra.  String was secured to the patient's penis.  He was taken down from lithotomy position, his anesthetic was reversed and  he was moved recovery in stable condition.  There were no complications.  He will be sent home with a stone fragments to bring to the office for analysis.

## 2019-02-10 NOTE — Anesthesia Procedure Notes (Signed)
Procedure Name: LMA Insertion Date/Time: 02/10/2019 7:23 AM Performed by: Suan Halter, CRNA Pre-anesthesia Checklist: Patient identified, Emergency Drugs available, Suction available and Patient being monitored Patient Re-evaluated:Patient Re-evaluated prior to induction Oxygen Delivery Method: Circle system utilized Preoxygenation: Pre-oxygenation with 100% oxygen Induction Type: IV induction Ventilation: Mask ventilation without difficulty LMA: LMA inserted LMA Size: 4.0 Number of attempts: 1 Airway Equipment and Method: Bite block Placement Confirmation: positive ETCO2 Tube secured with: Tape Dental Injury: Teeth and Oropharynx as per pre-operative assessment

## 2019-02-10 NOTE — Interval H&P Note (Signed)
He continues to have pain intermittently and the stones remain at the left UPJ and mid/lower poles on KUB.

## 2019-02-10 NOTE — Discharge Instructions (Addendum)
Post Anesthesia Home Care Instructions  Activity: Get plenty of rest for the remainder of the day. A responsible adult should stay with you for 24 hours following the procedure.  For the next 24 hours, DO NOT: -Drive a car -Paediatric nurse -Drink alcoholic beverages -Take any medication unless instructed by your physician -Make any legal decisions or sign important papers.  Meals: Start with liquid foods such as gelatin or soup. Progress to regular foods as tolerated. Avoid greasy, spicy, heavy foods. If nausea and/or vomiting occur, drink only clear liquids until the nausea and/or vomiting subsides. Call your physician if vomiting continues.  Special Instructions/Symptoms: Your throat may feel dry or sore from the anesthesia or the breathing tube placed in your throat during surgery. If this causes discomfort, gargle with warm salt water. The discomfort should disappear within 24 hours.  If you had a scopolamine patch placed behind your ear for the management of post- operative nausea and/or vomiting:  1. The medication in the patch is effective for 72 hours, after which it should be removed.  Wrap patch in a tissue and discard in the trash. Wash hands thoroughly with soap and water. 2. You may remove the patch earlier than 72 hours if you experience unpleasant side effects which may include dry mouth, dizziness or visual disturbances. 3. Avoid touching the patch. Wash your hands with soap and water after contact with the patch.   Ureteral Stent Implantation, Care After This sheet gives you information about how to care for yourself after your procedure. Your health care provider may also give you more specific instructions. If you have problems or questions, contact your health care provider. What can I expect after the procedure? After the procedure, it is common to have:  Nausea.  Mild pain when you urinate. You may feel this pain in your lower back or lower abdomen. The pain  should stop within a few minutes after you urinate. This may last for up to 1 week.  A small amount of blood in your urine for several days. Follow these instructions at home: Medicines  Take over-the-counter and prescription medicines only as told by your health care provider.  If you were prescribed an antibiotic medicine, take it as told by your health care provider. Do not stop taking the antibiotic even if you start to feel better.  Do not drive for 24 hours if you were given a sedative during your procedure.  Ask your health care provider if the medicine prescribed to you requires you to avoid driving or using heavy machinery. Activity  Rest as told by your health care provider.  Avoid sitting for a long time without moving. Get up to take short walks every 1-2 hours. This is important to improve blood flow and breathing. Ask for help if you feel weak or unsteady.  Return to your normal activities as told by your health care provider. Ask your health care provider what activities are safe for you. General instructions   Watch for any blood in your urine. Call your health care provider if the amount of blood in your urine increases.  If you have a catheter: ? Follow instructions from your health care provider about taking care of your catheter and collection bag. ? Do not take baths, swim, or use a hot tub until your health care provider approves. Ask your health care provider if you may take showers. You may only be allowed to take sponge baths.  Drink enough fluid to keep your  urine pale yellow.  Do not use any products that contain nicotine or tobacco, such as cigarettes, e-cigarettes, and chewing tobacco. These can delay healing after surgery. If you need help quitting, ask your health care provider.  Keep all follow-up visits as told by your health care provider. This is important. Contact a health care provider if:  You have pain that gets worse or does not get better  with medicine, especially pain when you urinate.  You have difficulty urinating.  You feel nauseous or you vomit repeatedly during a period of more than 2 days after the procedure. Get help right away if:  Your urine is dark red or has blood clots in it.  You are leaking urine (have incontinence).  The end of the stent comes out of your urethra.  You cannot urinate.  You have sudden, sharp, or severe pain in your abdomen or lower back.  You have a fever.  You have swelling or pain in your legs.  You have difficulty breathing. Summary  After the procedure, it is common to have mild pain when you urinate that goes away within a few minutes after you urinate. This may last for up to 1 week.  Watch for any blood in your urine. Call your health care provider if the amount of blood in your urine increases.  Take over-the-counter and prescription medicines only as told by your health care provider.  Drink enough fluid to keep your urine pale yellow.  You may remove the stent by pulling the attached string on Friday morning.  If you don't feel comfortable doing that, please call the office to have it done.   Please bring your stone to the office for analysis if we have not done that yet.   This information is not intended to replace advice given to you by your health care provider. Make sure you discuss any questions you have with your health care provider. Document Released: 10/29/2012 Document Revised: 12/03/2017 Document Reviewed: 12/04/2017 Elsevier Patient Education  2020 [employer].

## 2019-02-11 ENCOUNTER — Encounter: Payer: Self-pay | Admitting: Internal Medicine

## 2019-02-11 ENCOUNTER — Encounter (HOSPITAL_BASED_OUTPATIENT_CLINIC_OR_DEPARTMENT_OTHER): Payer: Self-pay | Admitting: Urology

## 2019-02-18 IMAGING — CT CT HEART SCORING
2 series · 16 of 20 positions shown, 18 images · non-contrast
Comparison: Chest CT 04/01/2014.

Addendum:
EXAM:
OVER-READ INTERPRETATION  CT CHEST

The following report is an over-read performed by radiologist Dr.
Livjo Rigoli [REDACTED] on 03/21/2018. This
over-read does not include interpretation of cardiac or coronary
anatomy or pathology. The coronary calcium score interpretation by
the cardiologist is attached.
CLINICAL DATA: Risk stratification
Coronary Calcium Score
TECHNIQUE: The patient was scanned on a Siemens Force scanner. Axial
non-contrast 3 mm slices were carried out through the heart. The
data set was analyzed on a dedicated work station and scored using
the Agatson method.

[Series 3: casc 3.0 i36f 2 bestdiast 65 % · axial · 0.35mm/px · z∈[+1182,+1280]mm · 8 of 43 slices shown, 10 images]
[im 5/43  vessel]
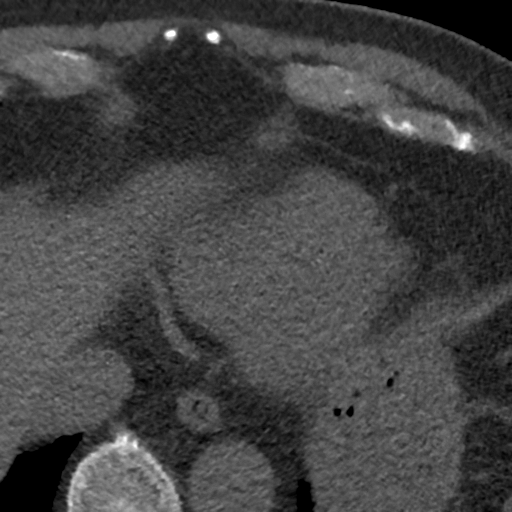
[im 5/43  lung]
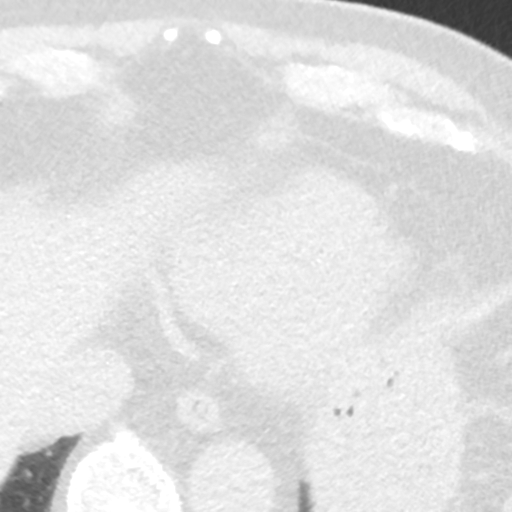
[im 10/43  vessel]
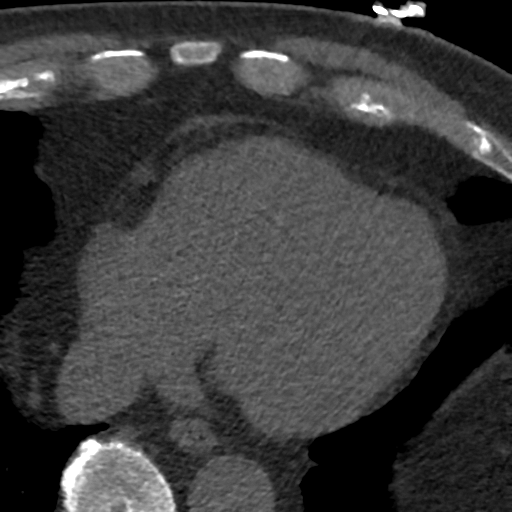
[im 15/43  vessel]
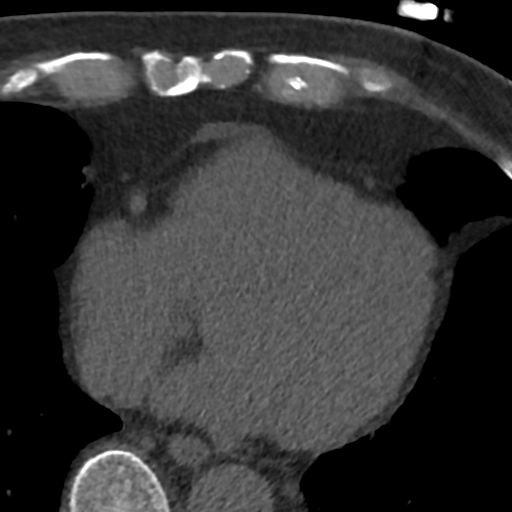
[im 19/43  vessel]
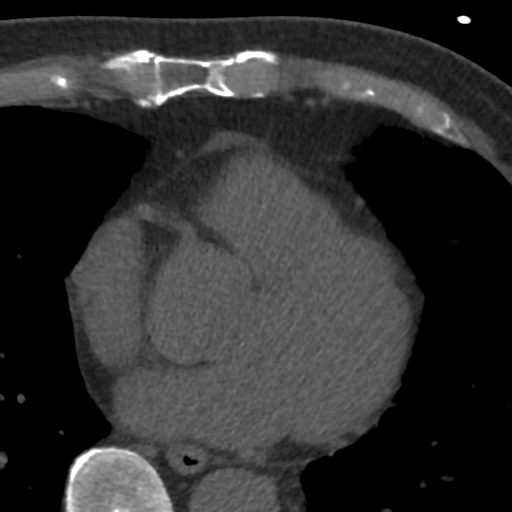
[im 24/43  vessel]
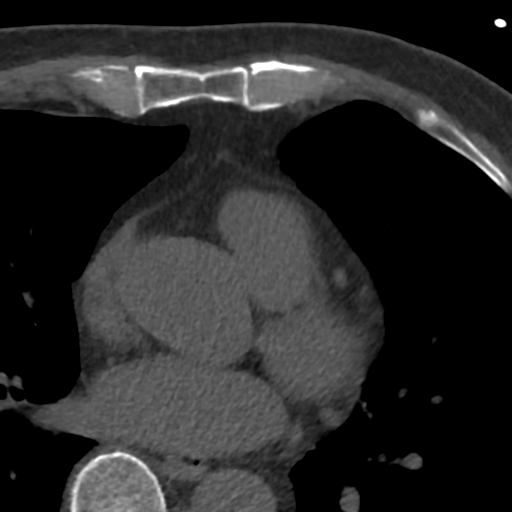
[im 24/43  lung]
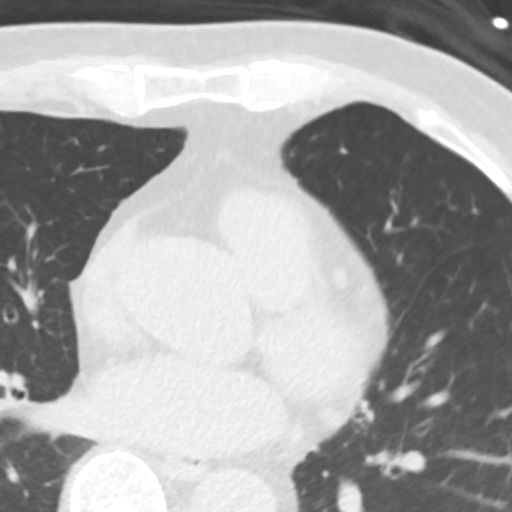
[im 29/43  vessel]
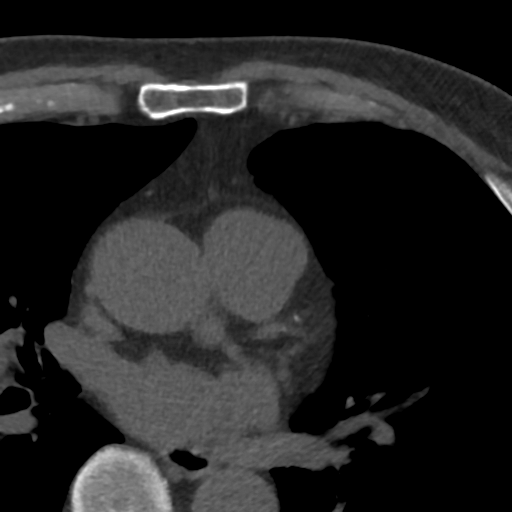
[im 33/43  vessel]
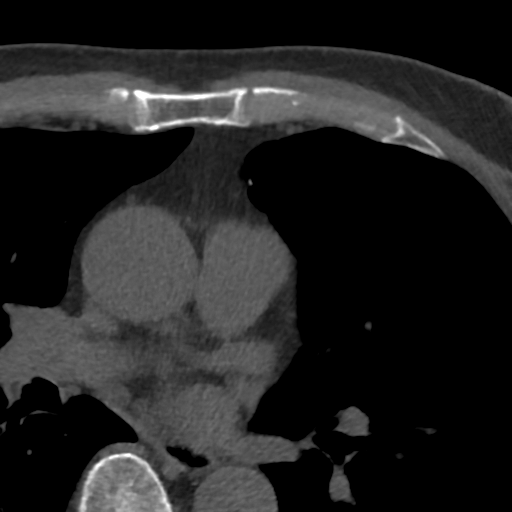
[im 38/43  vessel]
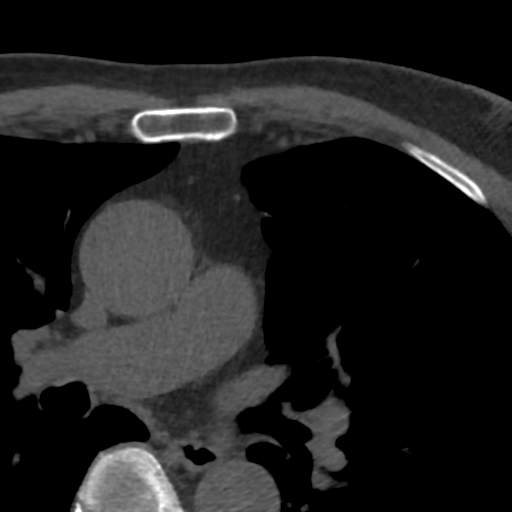

[Series 5: lung st 66 % · axial · 0.73mm/px · z∈[+1182,+1280]mm · 8 of 43 slices shown]
[im 5/43  lung]
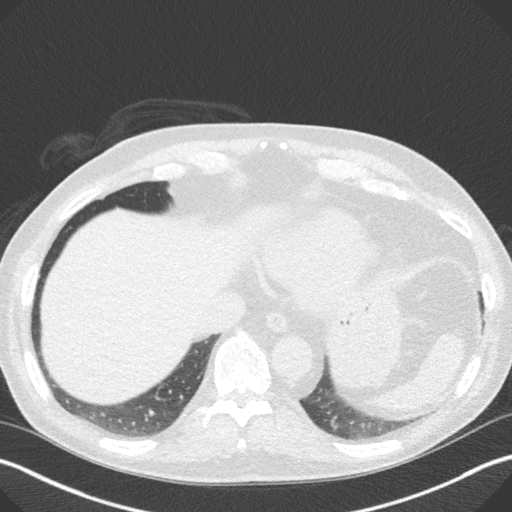
[im 10/43  lung]
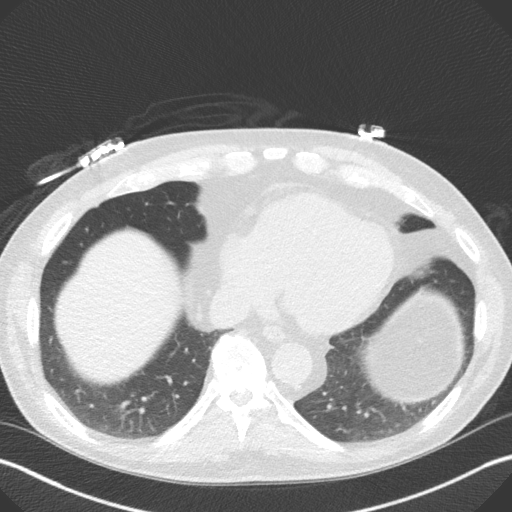
[im 15/43  lung]
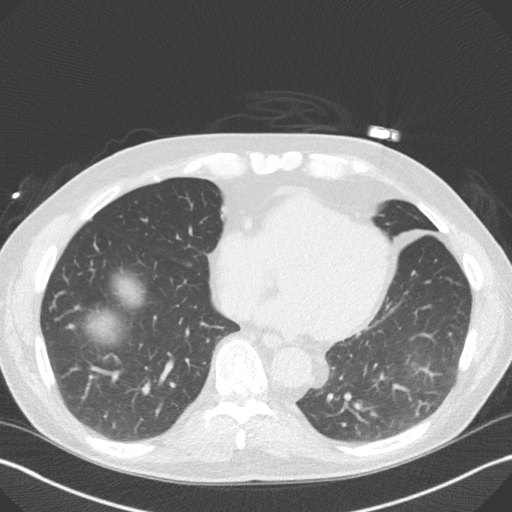
[im 19/43  lung]
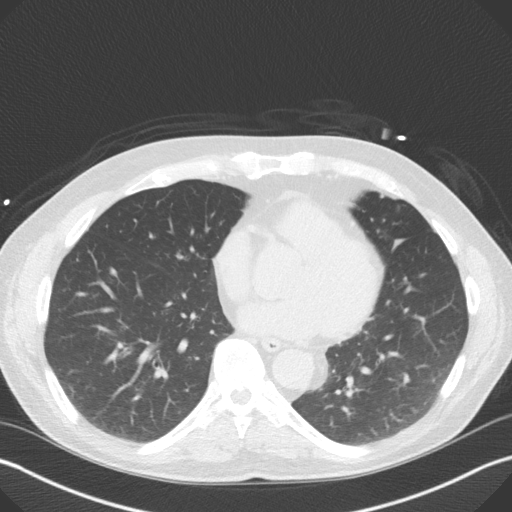
[im 24/43  lung]
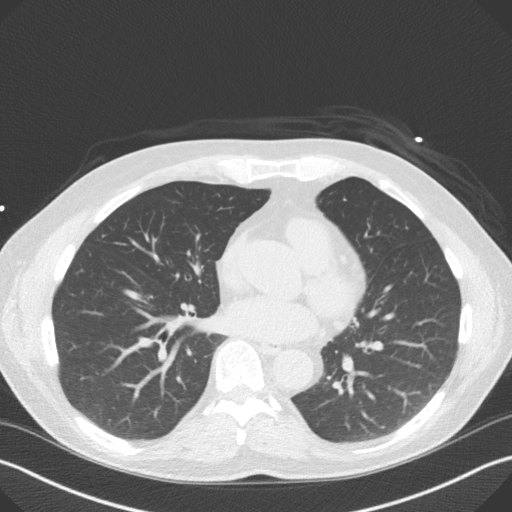
[im 29/43  lung]
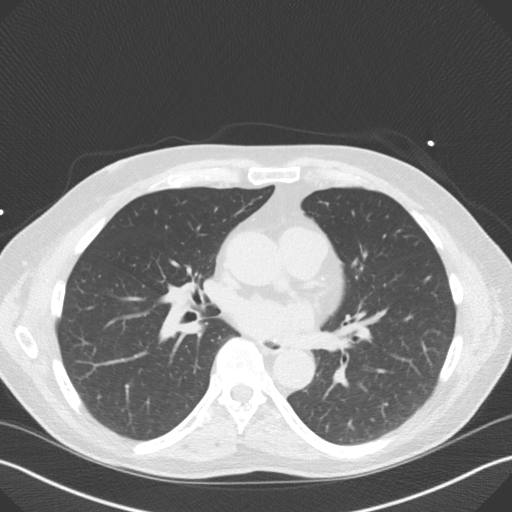
[im 33/43  lung]
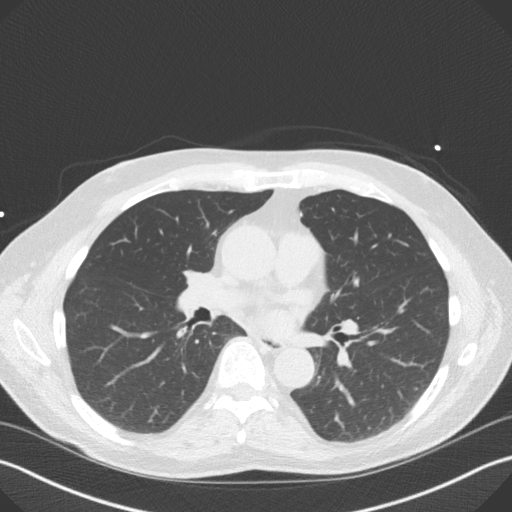
[im 38/43  lung]
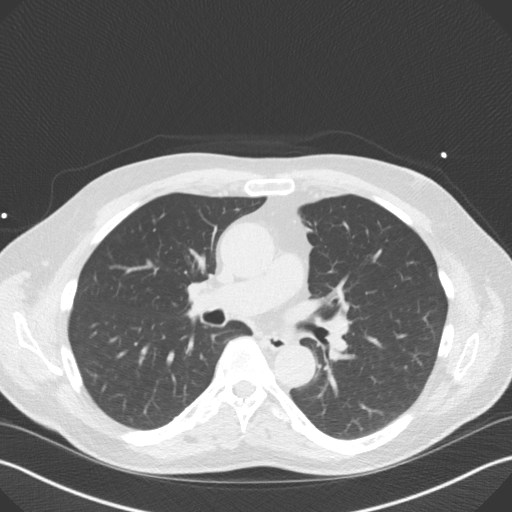

[16 of 20 positions shown; findings below may reference images not displayed]

FINDINGS: Within the visualized portions of the thorax there are no suspicious
appearing pulmonary nodules or masses, there is no acute
consolidative airspace disease, no pleural effusions, no
pneumothorax and no lymphadenopathy. Visualized portions of the
upper abdomen are unremarkable. There are no aggressive appearing
lytic or blastic lesions noted in the visualized portions of the
skeleton.
IMPRESSION: No significant incidental noncardiac findings are noted.
FINDINGS: Non-cardiac: See separate report from [REDACTED].

Ascending Aorta: Upper normal size of the ascending aorta for
age/sex measuring 40 mm in the axial views.

Pericardium: Normal.

Coronary arteries: Normal origin.
IMPRESSION: 1. Coronary calcium score of 8. This was 14 percentile for age and
sex matched control.

*** End of Addendum ***

## 2019-02-19 ENCOUNTER — Ambulatory Visit (INDEPENDENT_AMBULATORY_CARE_PROVIDER_SITE_OTHER): Payer: 59 | Admitting: Internal Medicine

## 2019-02-19 ENCOUNTER — Encounter: Payer: Self-pay | Admitting: Internal Medicine

## 2019-02-19 ENCOUNTER — Other Ambulatory Visit: Payer: Self-pay

## 2019-02-19 DIAGNOSIS — F411 Generalized anxiety disorder: Secondary | ICD-10-CM | POA: Diagnosis not present

## 2019-02-19 DIAGNOSIS — I10 Essential (primary) hypertension: Secondary | ICD-10-CM

## 2019-02-19 DIAGNOSIS — F329 Major depressive disorder, single episode, unspecified: Secondary | ICD-10-CM

## 2019-02-19 DIAGNOSIS — F32A Depression, unspecified: Secondary | ICD-10-CM

## 2019-02-19 DIAGNOSIS — E785 Hyperlipidemia, unspecified: Secondary | ICD-10-CM

## 2019-02-19 MED ORDER — ACETAMINOPHEN-CODEINE 300-60 MG PO TABS: 1.0000 | ORAL_TABLET | Freq: Two times a day (BID) | ORAL | 1 refills | Status: DC | PRN

## 2019-02-19 NOTE — Assessment & Plan Note (Signed)
On wellbutrin XL

## 2019-02-19 NOTE — Assessment & Plan Note (Signed)
2019 Coronary calcium score of 8.

## 2019-02-19 NOTE — Assessment & Plan Note (Signed)
Wellbutrin

## 2019-02-19 NOTE — Assessment & Plan Note (Signed)
[name] 

## 2019-02-19 NOTE — Progress Notes (Signed)
Subjective:  Patient ID: Paul [name]., male    DOB: Aug 02, 1945  Age: 73 y.o. MRN: [id number]  CC: No chief complaint on file.   HPI [employer]ane Pl[employer]. presents for HTN, LBP, dyslipidemia Renal stone procedure - recent  Outpatient Medications Prior to Visit  Medication Sig Dispense Refill  . amLODipine-olmesartan ([name]) 5-40 MG tablet Take 1 tablet by mouth daily. 90 tablet 3  . atorvastatin (LIPITOR) 10 MG tablet Take 1 tablet (10 mg total) by mouth daily. 90 tablet 3  . buPROPion (WELLBUTRIN XL) 300 MG 24 hr tablet Take 1 tablet (300 mg total) by mouth daily. 90 tablet 3  . Cholecalciferol 1000 UNITS tablet Take 1,000 Units by mouth daily.      . Cyanocobalamin (VITAMIN B 12 PO) Take 1 tablet by mouth.    . fenofibrate (TRICOR) 48 MG tablet Take 1 tablet (48 mg total) by mouth daily. 90 tablet 2  . finasteride (PROSCAR) 5 MG tablet Take 1 tablet (5 mg total) by mouth daily. 90 tablet 3  . HYDROcodone-acetaminophen (NORCO/VICODIN) 5-325 MG tablet Take 1 tablet by mouth every 6 (six) hours as needed for moderate pain. 15 tablet 0  . pantoprazole (PROTONIX) 40 MG tablet Take 1 tablet (40 mg total) by mouth daily. 90 tablet 1  . phenazopyridine (PYRIDIUM) 200 MG tablet Take 1 tablet (200 mg total) by mouth 3 (three) times daily as needed for pain. 10 tablet 1  . sildenafil (VIAGRA) 100 MG tablet Take 1 tablet (100 mg total) by mouth daily as needed for erectile dysfunction. 12 tablet 3  . triamterene-hydrochlorothiazide (MAXZIDE-25) 37.5-25 MG tablet Take 1 tablet by mouth daily. 90 tablet 3  . zolpidem (AMBIEN) 10 MG tablet Take 1 tablet (10 mg total) by mouth at bedtime as needed for sleep. 90 tablet 1   No facility-administered medications prior to visit.    ROS: Review of Systems  Constitutional: Negative for appetite change, fatigue and unexpected weight change.  HENT: Negative for congestion, nosebleeds, sneezing, sore throat and trouble swallowing.   Eyes: Negative  for itching and visual disturbance.  Respiratory: Negative for cough.   Cardiovascular: Negative for chest pain, palpitations and leg swelling.  Gastrointestinal: Negative for abdominal distention, blood in stool, diarrhea and nausea.  Genitourinary: Negative for frequency and hematuria.  Musculoskeletal: Positive for arthralgias and back pain. Negative for gait problem, joint swelling and neck pain.  Skin: Negative for rash.  Neurological: Negative for dizziness, tremors, speech difficulty and weakness.  Psychiatric/Behavioral: Negative for agitation, dysphoric mood and sleep disturbance. The patient is not nervous/anxious.     Objective:  BP 126/70 (BP Location: Left Arm, Patient Position: Sitting, Cuff Size: Large)   Pulse 76   Temp 98.3 F (36.8 C) (Oral)   Ht 6' (1.829 m)   Wt 195 lb (88.5 kg)   SpO2 97%   BMI 26.45 kg/m   BP Readings from Last 3 Encounters:  02/19/19 126/70  02/10/19 129/81  12/18/18 (!) 131/91    Wt Readings from Last 3 Encounters:  02/19/19 195 lb (88.5 kg)  02/10/19 196 lb 7 oz (89.1 kg)  12/10/18 196 lb (88.9 kg)    Physical Exam Constitutional:      General: He is not in acute distress.    Appearance: He is well-developed.     Comments: NAD  Eyes:     Conjunctiva/sclera: Conjunctivae normal.     Pupils: Pupils are equal, round, and reactive to light.  Neck:  Thyroid: No thyromegaly.     Vascular: No JVD.  Cardiovascular:     Rate and Rhythm: Normal rate and regular rhythm.     Heart sounds: Normal heart sounds. No murmur. No friction rub. No gallop.   Pulmonary:     Effort: Pulmonary effort is normal. No respiratory distress.     Breath sounds: Normal breath sounds. No wheezing or rales.  Chest:     Chest wall: No tenderness.  Abdominal:     General: Bowel sounds are normal. There is no distension.     Palpations: Abdomen is soft. There is no mass.     Tenderness: There is no abdominal tenderness. There is no guarding or rebound.   Musculoskeletal:        General: Tenderness present. Normal range of motion.     Cervical back: Normal range of motion.  Lymphadenopathy:     Cervical: No cervical adenopathy.  Skin:    General: Skin is warm and dry.     Findings: No rash.  Neurological:     Mental Status: He is alert and oriented to person, place, and time.     Cranial Nerves: No cranial nerve deficit.     Motor: No abnormal muscle tone.     Coordination: Coordination normal.     Gait: Gait normal.     Deep Tendon Reflexes: Reflexes are normal and symmetric.  Psychiatric:        Behavior: Behavior normal.        Thought Content: Thought content normal.        Judgment: Judgment normal.     Lab Results  Component Value Date   WBC 8.8 09/04/2017   HGB 10.9 (L) 02/10/2019   HCT 32.0 (L) 02/10/2019   PLT 309.0 09/04/2017   GLUCOSE 105 (H) 02/10/2019   CHOL 135 09/05/2016   TRIG 220.0 (H) 09/05/2016   HDL 33.00 (L) 09/05/2016   LDLDIRECT 67.0 09/05/2016   LDLCALC 98 01/04/2014   ALT 25 09/04/2017   AST 32 09/04/2017   NA 139 02/10/2019   K 3.9 02/10/2019   CL 100 02/10/2019   CREATININE 0.80 02/10/2019   BUN 21 02/10/2019   CO2 29 09/04/2017   TSH 2.06 09/04/2017   PSA 0.20 05/30/2016    No results found.  Assessment & Plan:   There are no diagnoses linked to this encounter.   No orders of the defined types were placed in this encounter.    Follow-up: No follow-ups on file.  Walker Kehr, MD

## 2019-02-23 ENCOUNTER — Ambulatory Visit: Payer: Self-pay

## 2019-02-23 ENCOUNTER — Encounter: Payer: Self-pay | Admitting: Internal Medicine

## 2019-02-23 NOTE — Telephone Encounter (Signed)
Patient called stating that he has had syncopal episodes. He states it is mainly when he stands up suddenly. He has a near fainting episode on Sunday. He stood and turned everything went blank He could feel himself going out but came back before LOC. He did not fall. He states his BP 140/81 HR 76. He is unsure what causes these episodes. He has no other symptoms. Care advice read to patient. Call transferred to office for scheduling today.  Reason for Disposition . [1] All other patients AND [2] now alert and feels fine(Exception: SIMPLE FAINT due to stress, pain, prolonged standing, or suddenly standing)  Answer Assessment - Initial Assessment Questions 1. ONSET: "How long were you unconscious?" (minutes) "When did it happen?"     Brief second 2. CONTENT: "What happened during period of unconsciousness?" (e.g., seizure activity)      nothing 3. MENTAL STATUS: "Alert and oriented now?" (oriented x 3 = name, month, location)      none 4. TRIGGER: "What do you think caused the fainting?" "What were you doing just before you fainted?"  (e.g., exercise, sudden standing up, prolonged standing)     Suddenly stood up and turned 5. RECURRENT SYMPTOM: "Have you ever passed out before?" If so, ask: "When was the last time?" and "What happened that time?"      A little dizziness sometimes 6. INJURY: "Did you sustain any injury during the fall?"      No 7. CARDIAC SYMPTOMS: "Have you had any of the following symptoms: chest pain, difficulty breathing, palpitations?"     no 8. NEUROLOGIC SYMPTOMS: "Have you had any of the following symptoms: headache, numbness, vertigo, weakness?"    Weakness dizziness just seconds 9. GI SYMPTOMS: "Have you had any of the following symptoms: abdominal pain, vomiting, diarrhea, blood in stools?"    no 10. OTHER SYMPTOMS: "Do you have any other symptoms?"     none 11. PREGNANCY: "Is there any chance you are pregnant?" "When was your last menstrual period?"       N/A  Protocols used: [facility]

## 2019-02-23 NOTE — Telephone Encounter (Signed)
Pt scheduled tomorrow

## 2019-02-24 ENCOUNTER — Encounter: Payer: Self-pay | Admitting: Internal Medicine

## 2019-02-24 ENCOUNTER — Ambulatory Visit (INDEPENDENT_AMBULATORY_CARE_PROVIDER_SITE_OTHER): Payer: 59 | Admitting: Internal Medicine

## 2019-02-24 ENCOUNTER — Other Ambulatory Visit: Payer: Self-pay

## 2019-02-24 DIAGNOSIS — R55 Syncope and collapse: Secondary | ICD-10-CM

## 2019-02-24 DIAGNOSIS — I10 Essential (primary) hypertension: Secondary | ICD-10-CM | POA: Diagnosis not present

## 2019-02-24 MED ORDER — TRIAMTERENE-HCTZ 37.5-25 MG PO TABS: 0.5000 | ORAL_TABLET | Freq: Every day | ORAL | 3 refills | Status: DC

## 2019-02-24 NOTE — Assessment & Plan Note (Signed)
Cont [name], reduce or stop Maxzide

## 2019-02-24 NOTE — Assessment & Plan Note (Addendum)
Orthostatic x1 Reduce Maxzide to 1/2 tab or stop Drink more water ECHO Card ref offered BMET and CBC, UA if not much better soon

## 2019-02-24 NOTE — Progress Notes (Signed)
Subjective:  Patient ID: Paul [name]., male    DOB: April 29, 1945  Age: 73 y.o. MRN: [id number]  CC: No chief complaint on file.   HPI Draken Mckine[employer]. presents for c/o orthostatic lightheadedness x 2-3 months. One time recently on Sunday he got up from a chair and started walking - got lightheaded, stopped feeling his body, knees buckled - he did not fall on the floor. He got pale. No CP. No seizure  Outpatient Medications Prior to Visit  Medication Sig Dispense Refill  . acetaminophen-codeine (TYLENOL #4) 300-60 MG tablet Take 1 tablet by mouth every 12 (twelve) hours as needed. for pain 60 tablet 1  . amLODipine-olmesartan ([name]) 5-40 MG tablet Take 1 tablet by mouth daily. 90 tablet 3  . atorvastatin (LIPITOR) 10 MG tablet Take 1 tablet (10 mg total) by mouth daily. 90 tablet 3  . buPROPion (WELLBUTRIN XL) 300 MG 24 hr tablet Take 1 tablet (300 mg total) by mouth daily. 90 tablet 3  . Cholecalciferol 1000 UNITS tablet Take 1,000 Units by mouth daily.      . Cyanocobalamin (VITAMIN B 12 PO) Take 1 tablet by mouth.    . fenofibrate (TRICOR) 48 MG tablet Take 1 tablet (48 mg total) by mouth daily. 90 tablet 2  . finasteride (PROSCAR) 5 MG tablet Take 1 tablet (5 mg total) by mouth daily. 90 tablet 3  . pantoprazole (PROTONIX) 40 MG tablet Take 1 tablet (40 mg total) by mouth daily. 90 tablet 1  . phenazopyridine (PYRIDIUM) 200 MG tablet Take 1 tablet (200 mg total) by mouth 3 (three) times daily as needed for pain. 10 tablet 1  . sildenafil (VIAGRA) 100 MG tablet Take 1 tablet (100 mg total) by mouth daily as needed for erectile dysfunction. 12 tablet 3  . triamterene-hydrochlorothiazide (MAXZIDE-25) 37.5-25 MG tablet Take 1 tablet by mouth daily. 90 tablet 3  . zolpidem (AMBIEN) 10 MG tablet Take 1 tablet (10 mg total) by mouth at bedtime as needed for sleep. 90 tablet 1   No facility-administered medications prior to visit.    ROS: Review of Systems  Constitutional:  Negative for appetite change, fatigue and unexpected weight change.  HENT: Negative for congestion, nosebleeds, sneezing, sore throat and trouble swallowing.   Eyes: Negative for itching and visual disturbance.  Respiratory: Negative for cough.   Cardiovascular: Negative for chest pain, palpitations and leg swelling.  Gastrointestinal: Negative for abdominal distention, blood in stool, diarrhea and nausea.  Genitourinary: Negative for frequency and hematuria.  Musculoskeletal: Positive for arthralgias and back pain. Negative for gait problem, joint swelling and neck pain.  Skin: Negative for rash.  Neurological: Positive for light-headedness. Negative for dizziness, tremors, syncope, speech difficulty and weakness.  Psychiatric/Behavioral: Negative for agitation, dysphoric mood and sleep disturbance. The patient is not nervous/anxious.     Objective:  BP 138/78 (BP Location: Left Arm, Patient Position: Standing, Cuff Size: Large)   Pulse 65   Temp 97.7 F (36.5 C) (Oral)   Ht 6' (1.829 m)   Wt 198 lb (89.8 kg)   SpO2 97%   BMI 26.85 kg/m   BP Readings from Last 3 Encounters:  02/24/19 138/78  02/19/19 126/70  02/10/19 129/81    Wt Readings from Last 3 Encounters:  02/24/19 198 lb (89.8 kg)  02/19/19 195 lb (88.5 kg)  02/10/19 196 lb 7 oz (89.1 kg)    Physical Exam Constitutional:      General: He is not in acute distress.  Appearance: He is well-developed.     Comments: NAD  Eyes:     Conjunctiva/sclera: Conjunctivae normal.     Pupils: Pupils are equal, round, and reactive to light.  Neck:     Thyroid: No thyromegaly.     Vascular: No JVD.  Cardiovascular:     Rate and Rhythm: Normal rate and regular rhythm.     Heart sounds: Normal heart sounds. No murmur. No friction rub. No gallop.   Pulmonary:     Effort: Pulmonary effort is normal. No respiratory distress.     Breath sounds: Normal breath sounds. No wheezing or rales.  Chest:     Chest wall: No  tenderness.  Abdominal:     General: Bowel sounds are normal. There is no distension.     Palpations: Abdomen is soft. There is no mass.     Tenderness: There is no abdominal tenderness. There is no guarding or rebound.  Musculoskeletal:        General: No tenderness. Normal range of motion.     Cervical back: Normal range of motion.  Lymphadenopathy:     Cervical: No cervical adenopathy.  Skin:    General: Skin is warm and dry.     Findings: No rash.  Neurological:     Mental Status: He is alert and oriented to person, place, and time.     Cranial Nerves: No cranial nerve deficit.     Motor: No abnormal muscle tone.     Coordination: Coordination normal.     Gait: Gait normal.     Deep Tendon Reflexes: Reflexes are normal and symmetric.  Psychiatric:        Behavior: Behavior normal.        Thought Content: Thought content normal.        Judgment: Judgment normal.    Recent EKG, labs reviewed   Lab Results  Component Value Date   WBC 8.8 09/04/2017   HGB 10.9 (L) 02/10/2019   HCT 32.0 (L) 02/10/2019   PLT 309.0 09/04/2017   GLUCOSE 105 (H) 02/10/2019   CHOL 135 09/05/2016   TRIG 220.0 (H) 09/05/2016   HDL 33.00 (L) 09/05/2016   LDLDIRECT 67.0 09/05/2016   LDLCALC 98 01/04/2014   ALT 25 09/04/2017   AST 32 09/04/2017   NA 139 02/10/2019   K 3.9 02/10/2019   CL 100 02/10/2019   CREATININE 0.80 02/10/2019   BUN 21 02/10/2019   CO2 29 09/04/2017   TSH 2.06 09/04/2017   PSA 0.20 05/30/2016    No results found.  Assessment & Plan:   There are no diagnoses linked to this encounter.   No orders of the defined types were placed in this encounter.    Follow-up: No follow-ups on file.  Walker Kehr, MD

## 2019-02-25 NOTE — Addendum Note (Signed)
Addended by: Karren Cobble on: 02/25/2019 03:17 PM   Modules accepted: Orders

## 2019-03-09 ENCOUNTER — Ambulatory Visit (HOSPITAL_COMMUNITY)
Admission: RE | Admit: 2019-03-09 | Discharge: 2019-03-09 | Disposition: A | Discharge status: Home / Self Care | Payer: 59 | Source: Ambulatory Visit | Attending: Cardiovascular Disease | Admitting: Cardiovascular Disease

## 2019-03-09 ENCOUNTER — Other Ambulatory Visit: Payer: Self-pay

## 2019-03-09 ENCOUNTER — Ambulatory Visit (HOSPITAL_BASED_OUTPATIENT_CLINIC_OR_DEPARTMENT_OTHER): Payer: 59

## 2019-03-09 DIAGNOSIS — R55 Syncope and collapse: Secondary | ICD-10-CM

## 2019-03-23 ENCOUNTER — Other Ambulatory Visit: Payer: Self-pay

## 2019-03-26 ENCOUNTER — Other Ambulatory Visit: Payer: Self-pay | Admitting: Internal Medicine

## 2019-03-26 MED ORDER — FENOFIBRATE 48 MG PO TABS: 48.0000 mg | ORAL_TABLET | Freq: Every day | ORAL | 1 refills | Status: DC

## 2019-03-26 MED ORDER — ATORVASTATIN CALCIUM 10 MG PO TABS: 10.0000 mg | ORAL_TABLET | Freq: Every day | ORAL | 1 refills | Status: DC

## 2019-03-26 MED ORDER — BUPROPION HCL ER (XL) 300 MG PO TB24: 300.0000 mg | ORAL_TABLET | Freq: Every day | ORAL | 1 refills | Status: DC

## 2019-03-26 MED ORDER — AMLODIPINE-OLMESARTAN 5-40 MG PO TABS: 1.0000 | ORAL_TABLET | Freq: Every day | ORAL | 1 refills | Status: DC

## 2019-03-26 NOTE — Telephone Encounter (Signed)
Proscar and Protonix too soon to refill at this time. Others approved per protocol.

## 2019-03-26 NOTE — Telephone Encounter (Signed)
Medication: pantoprazole (PROTONIX) 40 MG tablet [id number] , finasteride (PROSCAR) 5 MG tablet [id number] ,buPROPion (WELLBUTRIN XL) 300 MG 24 hr tablet [id number] , atorvastatin (LIPITOR) 10 MG tablet [id number] , fenofibrate (TRICOR) 48 MG tablet [id number] , amLODipine-olmesartan ([name]) 5-40 MG tablet [id number]   Has the patient contacted their pharmacy? Yes  (Agent: If no, request that the patient contact the pharmacy for the refill.) (Agent: If yes, when and what did the pharmacy advise?)  Preferred Pharmacy (with phone number or street name): [facility] [place], [place]  Phone:  [phone] Fax:  [phone]     Agent: Please be advised that RX refills may take up to 3 business days. We ask that you follow-up with your pharmacy.

## 2019-04-13 ENCOUNTER — Telehealth: Payer: Self-pay | Admitting: Internal Medicine

## 2019-04-13 NOTE — Telephone Encounter (Signed)
    [facility] requesting prior auth for amLODipine-olmesartan ([name]) 5-40 MG tablet Ticket # [id number] Phone [phone]

## 2019-04-21 NOTE — Telephone Encounter (Signed)
New(Not sent to plan) Unable to locate member at this time. Please try again later.

## 2019-05-11 ENCOUNTER — Other Ambulatory Visit: Payer: Self-pay | Admitting: Internal Medicine

## 2019-05-20 ENCOUNTER — Ambulatory Visit (INDEPENDENT_AMBULATORY_CARE_PROVIDER_SITE_OTHER): Payer: 59 | Admitting: Internal Medicine

## 2019-05-20 ENCOUNTER — Other Ambulatory Visit: Payer: Self-pay

## 2019-05-20 ENCOUNTER — Encounter: Payer: Self-pay | Admitting: Internal Medicine

## 2019-05-20 VITALS — BP 136/76 | HR 68 | Temp 98.4°F | Ht 72.0 in | Wt 197.0 lb

## 2019-05-20 DIAGNOSIS — I10 Essential (primary) hypertension: Secondary | ICD-10-CM

## 2019-05-20 DIAGNOSIS — F411 Generalized anxiety disorder: Secondary | ICD-10-CM

## 2019-05-20 DIAGNOSIS — N201 Calculus of ureter: Secondary | ICD-10-CM

## 2019-05-20 DIAGNOSIS — Z0000 Encounter for general adult medical examination without abnormal findings: Secondary | ICD-10-CM | POA: Diagnosis not present

## 2019-05-20 LAB — HEPATIC FUNCTION PANEL
ALT: 19 U/L (ref 0–53)
AST: 31 U/L (ref 0–37)
Albumin: 4.1 g/dL (ref 3.5–5.2)
Alkaline Phosphatase: 56 U/L (ref 39–117)
Bilirubin, Direct: 0.1 mg/dL (ref 0.0–0.3)
Total Bilirubin: 0.4 mg/dL (ref 0.2–1.2)
Total Protein: 6.3 g/dL (ref 6.0–8.3)

## 2019-05-20 LAB — TSH: TSH: 3.03 u[IU]/mL (ref 0.35–4.50)

## 2019-05-20 LAB — CBC WITH DIFFERENTIAL/PLATELET
Basophils Absolute: 0 10*3/uL (ref 0.0–0.1)
Basophils Relative: 0.7 % (ref 0.0–3.0)
Eosinophils Absolute: 0.2 10*3/uL (ref 0.0–0.7)
Eosinophils Relative: 2.7 % (ref 0.0–5.0)
HCT: 47.3 % (ref 39.0–52.0)
Hemoglobin: 16 g/dL (ref 13.0–17.0)
Lymphocytes Relative: 22.9 % (ref 12.0–46.0)
Lymphs Abs: 1.6 10*3/uL (ref 0.7–4.0)
MCHC: 33.8 g/dL (ref 30.0–36.0)
MCV: 89.6 fl (ref 78.0–100.0)
Monocytes Absolute: 0.7 10*3/uL (ref 0.1–1.0)
Monocytes Relative: 9.7 % (ref 3.0–12.0)
Neutro Abs: 4.6 10*3/uL (ref 1.4–7.7)
Neutrophils Relative %: 64 % (ref 43.0–77.0)
Platelets: 269 10*3/uL (ref 150.0–400.0)
RBC: 5.27 Mil/uL (ref 4.22–5.81)
RDW: 12.6 % (ref 11.5–15.5)
WBC: 7.2 10*3/uL (ref 4.0–10.5)

## 2019-05-20 LAB — BASIC METABOLIC PANEL
BUN: 25 mg/dL — ABNORMAL HIGH (ref 6–23)
CO2: 26 mEq/L (ref 19–32)
Calcium: 9.1 mg/dL (ref 8.4–10.5)
Chloride: 104 mEq/L (ref 96–112)
Creatinine, Ser: 0.91 mg/dL (ref 0.40–1.50)
GFR: 81.26 mL/min (ref >60.00)
Glucose, Bld: 115 mg/dL — ABNORMAL HIGH (ref 70–99)
Potassium: 3.8 mEq/L (ref 3.5–5.1)
Sodium: 138 mEq/L (ref 135–145)

## 2019-05-20 LAB — LIPID PANEL
Cholesterol: 196 mg/dL (ref 0–200)
HDL: 42.2 mg/dL (ref >39.00)
Total CHOL/HDL Ratio: 5
Triglycerides: 478 mg/dL — ABNORMAL HIGH (ref 0.0–149.0)

## 2019-05-20 LAB — LDL CHOLESTEROL, DIRECT: Direct LDL: 87 mg/dL

## 2019-05-20 LAB — PSA: PSA: 0.15 ng/mL (ref 0.10–4.00)

## 2019-05-20 NOTE — Assessment & Plan Note (Signed)
Paul [name], Paul [name] 

## 2019-05-20 NOTE — Progress Notes (Signed)
Subjective:  Patient ID: Paul [name]., male    DOB: [birth year]  Age: 74 y.o. MRN: [id number]  CC: No chief complaint on file.   HPI Paul [name] [employer]. presents for HTN, LBP, depression, kidney stones, dyslipidemia f/u  Outpatient Medications Prior to Visit  Medication Sig Dispense Refill  . acetaminophen-codeine (TYLENOL #4) 300-60 MG tablet TAKE 1 TABLET BY MOUTH EVERY 12 HOURS AS NEEDED FOR PAIN 60 tablet 1  . amLODipine-olmesartan ([name]) 5-40 MG tablet Take 1 tablet by mouth daily. 90 tablet 1  . atorvastatin (LIPITOR) 10 MG tablet Take 1 tablet (10 mg total) by mouth daily. 90 tablet 1  . buPROPion (WELLBUTRIN XL) 300 MG 24 hr tablet Take 1 tablet (300 mg total) by mouth daily. 90 tablet 1  . Cholecalciferol 1000 UNITS tablet Take 1,000 Units by mouth daily.      . Cyanocobalamin (VITAMIN B 12 PO) Take 1 tablet by mouth.    . fenofibrate (TRICOR) 48 MG tablet Take 1 tablet (48 mg total) by mouth daily. 90 tablet 1  . finasteride (PROSCAR) 5 MG tablet Take 1 tablet (5 mg total) by mouth daily. 90 tablet 3  . pantoprazole (PROTONIX) 40 MG tablet Take 1 tablet (40 mg total) by mouth daily. 90 tablet 1  . sildenafil (VIAGRA) 100 MG tablet Take 1 tablet (100 mg total) by mouth daily as needed for erectile dysfunction. 12 tablet 3  . triamterene-hydrochlorothiazide (MAXZIDE-25) 37.5-25 MG tablet Take 0.5 tablets by mouth daily. 90 tablet 3  . zolpidem (AMBIEN) 10 MG tablet Take 1 tablet (10 mg total) by mouth at bedtime as needed for sleep. 90 tablet 1   No facility-administered medications prior to visit.    ROS: Review of Systems  Constitutional: Negative for appetite change, fatigue and unexpected weight change.  HENT: Negative for congestion, nosebleeds, sneezing, sore throat and trouble swallowing.   Eyes: Negative for itching and visual disturbance.  Respiratory: Negative for cough.   Cardiovascular: Negative for chest pain, palpitations and leg swelling.    Gastrointestinal: Negative for abdominal distention, blood in stool, diarrhea and nausea.  Genitourinary: Negative for frequency and hematuria.  Musculoskeletal: Positive for back pain. Negative for gait problem, joint swelling and neck pain.  Skin: Negative for rash.  Neurological: Negative for dizziness, tremors, speech difficulty and weakness.  Psychiatric/Behavioral: Negative for agitation, dysphoric mood and sleep disturbance. The patient is not nervous/anxious.     Objective:  Ht 6' (1.829 m)   Wt 197 lb (89.4 kg)   BMI 26.72 kg/m   BP Readings from Last 3 Encounters:  02/24/19 138/78  02/19/19 126/70  02/10/19 129/81    Wt Readings from Last 3 Encounters:  05/20/19 197 lb (89.4 kg)  02/24/19 198 lb (89.8 kg)  02/19/19 195 lb (88.5 kg)    Physical Exam Constitutional:      General: He is not in acute distress.    Appearance: He is well-developed.     Comments: NAD  Eyes:     Conjunctiva/sclera: Conjunctivae normal.     Pupils: Pupils are equal, round, and reactive to light.  Neck:     Thyroid: No thyromegaly.     Vascular: No JVD.  Cardiovascular:     Rate and Rhythm: Normal rate and regular rhythm.     Heart sounds: Normal heart sounds. No murmur. No friction rub. No gallop.   Pulmonary:     Effort: Pulmonary effort is normal. No respiratory distress.     Breath  sounds: Normal breath sounds. No wheezing or rales.  Chest:     Chest wall: No tenderness.  Abdominal:     General: Bowel sounds are normal. There is no distension.     Palpations: Abdomen is soft. There is no mass.     Tenderness: There is no abdominal tenderness. There is no guarding or rebound.  Musculoskeletal:        General: Tenderness present. Normal range of motion.     Cervical back: Normal range of motion.  Lymphadenopathy:     Cervical: No cervical adenopathy.  Skin:    General: Skin is warm and dry.     Findings: No rash.  Neurological:     Mental Status: He is alert and oriented  to person, place, and time.     Cranial Nerves: No cranial nerve deficit.     Motor: No abnormal muscle tone.     Coordination: Coordination normal.     Gait: Gait normal.     Deep Tendon Reflexes: Reflexes are normal and symmetric.  Psychiatric:        Behavior: Behavior normal.        Thought Content: Thought content normal.        Judgment: Judgment normal.    LS tender   Lab Results  Component Value Date   WBC 8.8 09/04/2017   HGB 10.9 (L) 02/10/2019   HCT 32.0 (L) 02/10/2019   PLT 309.0 09/04/2017   GLUCOSE 105 (H) 02/10/2019   CHOL 135 09/05/2016   TRIG 220.0 (H) 09/05/2016   HDL 33.00 (L) 09/05/2016   LDLDIRECT 67.0 09/05/2016   LDLCALC 98 01/04/2014   ALT 25 09/04/2017   AST 32 09/04/2017   NA 139 02/10/2019   K 3.9 02/10/2019   CL 100 02/10/2019   CREATININE 0.80 02/10/2019   BUN 21 02/10/2019   CO2 29 09/04/2017   TSH 2.06 09/04/2017   PSA 0.20 05/30/2016    ECHOCARDIOGRAM COMPLETE  Result Date: 03/09/2019   ECHOCARDIOGRAM REPORT   Patient Name:   Paul [name]. Date of Exam: 03/09/2019 Medical Rec #:  [id number]            Height:       72.0 in Accession #:    [id number]           Weight:       198.0 lb Date of Birth:  04-Dec-1945            BSA:          2.12 m Patient Age:    62 years             BP:           126/70 mmHg Patient Gender: M                    HR:           65 bpm. Exam Location:  [facility] Procedure: 2D Echo, Cardiac Doppler and Color Doppler Indications:    R55 Syncope  History:        Patient has no prior history of Echocardiogram examinations.                 Risk Factors:Hypertension and Dyslipidemia.  Sonographer:    Wilford Sports Rodgers-Jones RDCS Referring Phys: [place]  1. Left ventricular ejection fraction, by visual estimation, is 65 to 70%. The left ventricle has hyperdynamic function. There is no left ventricular hypertrophy.  2. Left ventricular diastolic parameters are consistent with Grade I  diastolic dysfunction (impaired relaxation).  3. The left ventricle has no regional wall motion abnormalities.  4. Global right ventricle has normal systolic function.The right ventricular size is normal. No increase in right ventricular wall thickness.  5. Left atrial size was normal.  6. Right atrial size was normal.  7. The mitral valve is normal in structure. Mild mitral valve regurgitation. No evidence of mitral stenosis.  8. The tricuspid valve is normal in structure.  9. The aortic valve is normal in structure. Aortic valve regurgitation is not visualized. No evidence of aortic valve sclerosis or stenosis. 10. The pulmonic valve was normal in structure. Pulmonic valve regurgitation is trivial. 11. Normal pulmonary artery systolic pressure. 12. The tricuspid regurgitant velocity is 2.26 m/s, and with an assumed right atrial pressure of 3 mmHg, the estimated right ventricular systolic pressure is normal at 23.4 mmHg. 13. The inferior vena cava is normal in size with greater than 50% respiratory variability, suggesting right atrial pressure of 3 mmHg. FINDINGS  Left Ventricle: Left ventricular ejection fraction, by visual estimation, is 65 to 70%. The left ventricle has hyperdynamic function. The left ventricle has no regional wall motion abnormalities. There is no left ventricular hypertrophy. Left ventricular diastolic parameters are consistent with Grade I diastolic dysfunction (impaired relaxation). Normal left atrial pressure. Right Ventricle: The right ventricular size is normal. No increase in right ventricular wall thickness. Global RV systolic function is has normal systolic function. The tricuspid regurgitant velocity is 2.26 m/s, and with an assumed right atrial pressure  of 3 mmHg, the estimated right ventricular systolic pressure is normal at 23.4 mmHg. Left Atrium: Left atrial size was normal in size. Right Atrium: Right atrial size was normal in size Pericardium: There is no evidence of  pericardial effusion. Mitral Valve: The mitral valve is normal in structure. Mild mitral valve regurgitation. No evidence of mitral valve stenosis by observation. Tricuspid Valve: The tricuspid valve is normal in structure. Tricuspid valve regurgitation is mild. Aortic Valve: The aortic valve is normal in structure. Aortic valve regurgitation is not visualized. The aortic valve is structurally normal, with no evidence of sclerosis or stenosis. Pulmonic Valve: The pulmonic valve was normal in structure. Pulmonic valve regurgitation is trivial. Pulmonic regurgitation is trivial. Aorta: The aortic root, ascending aorta and aortic arch are all structurally normal, with no evidence of dilitation or obstruction. Venous: The inferior vena cava is normal in size with greater than 50% respiratory variability, suggesting right atrial pressure of 3 mmHg. IAS/Shunts: No atrial level shunt detected by color flow Doppler. There is no evidence of a patent foramen ovale. No ventricular septal defect is seen or detected. There is no evidence of an atrial septal defect.  LEFT VENTRICLE PLAX 2D LVIDd:         4.10 cm  Diastology LVIDs:         2.60 cm  LV e' lateral:   8.59 cm/s LV PW:         0.90 cm  LV E/e' lateral: 8.2 LV IVS:        1.10 cm  LV e' medial:    7.72 cm/s LVOT diam:     2.20 cm  LV E/e' medial:  9.1 LV SV:         50 ml LV SV Index:   23.09 LVOT Area:     3.80 cm  RIGHT VENTRICLE RV Basal diam:  3.70 cm RV S prime:  16.60 cm/s TAPSE (M-mode): 2.3 cm LEFT ATRIUM             Index       RIGHT ATRIUM           Index LA diam:        3.90 cm 1.84 cm/m  RA Area:     12.50 cm LA Vol (A2C):   57.9 ml 27.29 ml/m RA Volume:   29.20 ml  13.76 ml/m LA Vol (A4C):   32.3 ml 15.23 ml/m LA Biplane Vol: 46.6 ml 21.97 ml/m  AORTIC VALVE LVOT Vmax:   92.85 cm/s LVOT Vmean:  66.600 cm/s LVOT VTI:    0.208 m  AORTA Ao Root diam: 4.20 cm Ao Asc diam:  3.90 cm MITRAL VALVE                        TRICUSPID VALVE MV Area (PHT):  4.21 cm             TR Peak grad:   20.4 mmHg MV PHT:        52.20 msec           TR Vmax:        226.00 cm/s MV Decel Time: 180 msec MV E velocity: 70.30 cm/s 103 cm/s  SHUNTS MV A velocity: 73.30 cm/s 70.3 cm/s Systemic VTI:  0.21 m MV E/A ratio:  0.96       1.5       Systemic Diam: 2.20 cm  Ena Dawley MD Electronically signed by Ena Dawley MD Signature Date/Time: 03/09/2019/4:18:25 PM    Final    VAS US CAROTID  Result Date: 03/09/2019 Carotid Arterial Duplex Study Indications:       Patient reports intermittent dizziness x 1 year and one                    presyncope episode occurring a couple of weeks ago. He                    relates his symptoms to blood pressure medication. He states                    since his prescription has be halved he has not experienced                    any of the symptoms. He denies any other cerebrovascular                    symptoms. Risk Factors:      Hypertension, hyperlipidemia, past history of smoking. Comparison Study:  NA Performing Technologist: Sharlett Iles RVT  Examination Guidelines: A complete evaluation includes B-mode imaging, spectral Doppler, color Doppler, and power Doppler as needed of all accessible portions of each vessel. Bilateral testing is considered an integral part of a complete examination. Limited examinations for reoccurring indications may be performed as noted.  Right Carotid Findings: +----------+--------+--------+--------+------------------+--------+           PSV cm/sEDV cm/sStenosisPlaque DescriptionComments +----------+--------+--------+--------+------------------+--------+ CCA Prox  126     14                                         +----------+--------+--------+--------+------------------+--------+ CCA Distal94      11                                         +----------+--------+--------+--------+------------------+--------+  ICA Prox  84      15                                          +----------+--------+--------+--------+------------------+--------+ ICA Mid   74      17                                         +----------+--------+--------+--------+------------------+--------+ ICA Distal79      21                                         +----------+--------+--------+--------+------------------+--------+ ECA       132     9                                          +----------+--------+--------+--------+------------------+--------+ +----------+--------+-------+----------------+-------------------+           PSV cm/sEDV cmsDescribe        Arm Pressure (mmHG) +----------+--------+-------+----------------+-------------------+ [id number]            Multiphasic, [id number]                 +----------+--------+-------+----------------+-------------------+ +---------+--------+--+--------+-+---------+ VertebralPSV cm/s36EDV cm/s7Antegrade +---------+--------+--+--------+-+---------+  Left Carotid Findings: +----------+--------+--------+--------+------------------+------------------+           PSV cm/sEDV cm/sStenosisPlaque DescriptionComments           +----------+--------+--------+--------+------------------+------------------+ CCA Prox  118     14                                                   +----------+--------+--------+--------+------------------+------------------+ CCA Distal88      16                                                   +----------+--------+--------+--------+------------------+------------------+ ICA Prox  81      18      1-39%   heterogenous      intimal thickening +----------+--------+--------+--------+------------------+------------------+ ICA Mid   68      22                                                   +----------+--------+--------+--------+------------------+------------------+ ICA Distal85      29                                                    +----------+--------+--------+--------+------------------+------------------+ ECA       90      9               heterogenous                         +----------+--------+--------+--------+------------------+------------------+ +----------+--------+--------+----------------+-------------------+  PSV cm/sEDV cm/sDescribe        Arm Pressure (mmHG) +----------+--------+--------+----------------+-------------------+ [id number]             Multiphasic, [id number]                 +----------+--------+--------+----------------+-------------------+ +---------+--------+--+--------+-+---------+ VertebralPSV cm/s34EDV cm/s7Antegrade +---------+--------+--+--------+-+---------+  Summary: Right Carotid: There was no evidence of thrombus, dissection, atherosclerotic                plaque or stenosis in the cervical carotid system. Left Carotid: Velocities in the left ICA are consistent with a 1-39% stenosis. Vertebrals:  Bilateral vertebral arteries demonstrate antegrade flow. Subclavians: Normal flow hemodynamics were seen in bilateral subclavian              arteries. *See table(s) above for measurements and observations.  Electronically signed by Jenkins Rouge MD on 03/09/2019 at 11:40:33 AM.    Final     Assessment & Plan:   There are no diagnoses linked to this encounter.   No orders of the defined types were placed in this encounter.    Walker Kehr, MD

## 2019-05-20 NOTE — Assessment & Plan Note (Signed)
Wellbutrin po

## 2019-06-01 ENCOUNTER — Other Ambulatory Visit: Payer: Self-pay | Admitting: Internal Medicine

## 2019-06-03 ENCOUNTER — Other Ambulatory Visit: Payer: Self-pay | Admitting: Internal Medicine

## 2019-06-03 MED ORDER — FENOFIBRATE 145 MG PO TABS: 145.0000 mg | ORAL_TABLET | Freq: Every day | ORAL | 11 refills | Status: DC

## 2019-06-10 ENCOUNTER — Other Ambulatory Visit: Payer: Self-pay | Admitting: Internal Medicine

## 2019-06-22 ENCOUNTER — Other Ambulatory Visit: Payer: Self-pay | Admitting: Internal Medicine

## 2019-07-01 MED ORDER — FENOFIBRATE 145 MG PO TABS: 145.0000 mg | ORAL_TABLET | Freq: Every day | ORAL | 11 refills | Status: DC

## 2019-07-16 ENCOUNTER — Other Ambulatory Visit: Payer: Self-pay | Admitting: Internal Medicine

## 2019-07-17 NOTE — Telephone Encounter (Signed)
Please advise about refill in Dr. Plotnikovs absence. 

## 2019-08-17 ENCOUNTER — Other Ambulatory Visit: Payer: Self-pay | Admitting: Internal Medicine

## 2019-08-17 NOTE — Telephone Encounter (Signed)
[place] Controlled Database Checked Last filled:  07/17/19 # 60 LOV w/you:  05/20/19 Next appt w/you:  11/23/19

## 2019-09-03 ENCOUNTER — Other Ambulatory Visit: Payer: Self-pay | Admitting: Internal Medicine

## 2019-11-02 ENCOUNTER — Other Ambulatory Visit: Payer: Self-pay | Admitting: Internal Medicine

## 2019-11-23 ENCOUNTER — Encounter: Payer: Self-pay | Admitting: Internal Medicine

## 2019-11-23 ENCOUNTER — Ambulatory Visit: Payer: 59 | Admitting: Internal Medicine

## 2019-11-23 ENCOUNTER — Other Ambulatory Visit: Payer: Self-pay

## 2019-11-23 DIAGNOSIS — I10 Essential (primary) hypertension: Secondary | ICD-10-CM

## 2019-11-23 DIAGNOSIS — F32A Depression, unspecified: Secondary | ICD-10-CM

## 2019-11-23 DIAGNOSIS — F411 Generalized anxiety disorder: Secondary | ICD-10-CM | POA: Diagnosis not present

## 2019-11-23 DIAGNOSIS — G4700 Insomnia, unspecified: Secondary | ICD-10-CM | POA: Diagnosis not present

## 2019-11-23 DIAGNOSIS — F329 Major depressive disorder, single episode, unspecified: Secondary | ICD-10-CM | POA: Diagnosis not present

## 2019-11-23 DIAGNOSIS — J4521 Mild intermittent asthma with (acute) exacerbation: Secondary | ICD-10-CM

## 2019-11-23 DIAGNOSIS — E785 Hyperlipidemia, unspecified: Secondary | ICD-10-CM

## 2019-11-23 MED ORDER — ZOLPIDEM TARTRATE 10 MG PO TABS: ORAL_TABLET | ORAL | 1 refills | Status: DC

## 2019-11-23 MED ORDER — ACETAMINOPHEN-CODEINE 300-60 MG PO TABS: 1.0000 | ORAL_TABLET | Freq: Two times a day (BID) | ORAL | 2 refills | Status: DC | PRN

## 2019-11-23 NOTE — Assessment & Plan Note (Signed)
No MDIs T#4 for bad cough

## 2019-11-23 NOTE — Assessment & Plan Note (Signed)
On Wellbutrin XL 

## 2019-11-23 NOTE — Assessment & Plan Note (Signed)
TG is better on Tricor

## 2019-11-23 NOTE — Progress Notes (Signed)
Subjective:  Patient ID: Paul [name]., male    DOB: 1945/03/29  Age: 74 y.o. MRN: [id number]  CC: No chief complaint on file.   HPI Paul [name] [employer]. presents for LBP, insomnia, depression f/u C/o dizzy episodes x 2-3  Outpatient Medications Prior to Visit  Medication Sig Dispense Refill  . acetaminophen-codeine (TYLENOL #4) 300-60 MG tablet TAKE 1 TABLET BY MOUTH EVERY 12 HOURS AS NEEDED FOR PAIN 60 tablet 0  . amLODipine-olmesartan ([name]) 5-40 MG tablet Take 1 tablet by mouth daily. 90 tablet 1  . atorvastatin (LIPITOR) 10 MG tablet Take 1 tablet by mouth daily. 90 tablet 1  . buPROPion (WELLBUTRIN XL) 300 MG 24 hr tablet Take 1 tablet by mouth daily. 90 tablet 1  . Cholecalciferol 1000 UNITS tablet Take 1,000 Units by mouth daily.      . Cyanocobalamin (VITAMIN B 12 PO) Take 1 tablet by mouth.    . fenofibrate (TRICOR) 145 MG tablet Take 1 tablet (145 mg total) by mouth daily. 28 tablet 11  . fenofibrate (TRICOR) 145 MG tablet Take 1 tablet (145 mg total) by mouth daily. 28 tablet 11  . finasteride (PROSCAR) 5 MG tablet Take 1 tablet by mouth daily. 90 tablet 3  . pantoprazole (PROTONIX) 40 MG tablet Take 1 tablet by mouth daily. 90 tablet 3  . sildenafil (VIAGRA) 100 MG tablet Take 1 tablet (100 mg total) by mouth daily as needed for erectile dysfunction. 12 tablet 3  . triamterene-hydrochlorothiazide (MAXZIDE-25) 37.5-25 MG tablet Take 1 tablet by mouth daily. 90 tablet 3  . zolpidem (AMBIEN) 10 MG tablet TAKE 1 TABLET(10 MG) BY MOUTH AT BEDTIME AS NEEDED FOR SLEEP 90 tablet 1   No facility-administered medications prior to visit.    ROS: Review of Systems  Constitutional: Negative for appetite change, fatigue and unexpected weight change.  HENT: Negative for congestion, nosebleeds, sneezing, sore throat and trouble swallowing.   Eyes: Negative for itching and visual disturbance.  Respiratory: Negative for cough.   Cardiovascular: Negative for chest pain,  palpitations and leg swelling.  Gastrointestinal: Negative for abdominal distention, blood in stool, diarrhea and nausea.  Genitourinary: Negative for frequency and hematuria.  Musculoskeletal: Positive for back pain. Negative for gait problem, joint swelling and neck pain.  Skin: Negative for rash.  Neurological: Negative for dizziness, tremors, speech difficulty and weakness.  Psychiatric/Behavioral: Positive for sleep disturbance. Negative for agitation and dysphoric mood. The patient is nervous/anxious.     Objective:  BP (!) 148/80 (BP Location: Right Arm, Patient Position: Sitting, Cuff Size: Large)   Pulse 64   Temp 98.3 F (36.8 C) (Oral)   Ht 6' (1.829 m)   Wt 201 lb (91.2 kg)   SpO2 98%   BMI 27.26 kg/m   BP Readings from Last 3 Encounters:  11/23/19 (!) 148/80  05/20/19 136/76  02/24/19 138/78    Wt Readings from Last 3 Encounters:  11/23/19 201 lb (91.2 kg)  05/20/19 197 lb (89.4 kg)  02/24/19 198 lb (89.8 kg)    Physical Exam Constitutional:      General: He is not in acute distress.    Appearance: He is well-developed. He is obese.     Comments: NAD  Eyes:     Conjunctiva/sclera: Conjunctivae normal.     Pupils: Pupils are equal, round, and reactive to light.  Neck:     Thyroid: No thyromegaly.     Vascular: No JVD.  Cardiovascular:     Rate and  Rhythm: Normal rate and regular rhythm.     Heart sounds: Normal heart sounds. No murmur heard.  No friction rub. No gallop.   Pulmonary:     Effort: Pulmonary effort is normal. No respiratory distress.     Breath sounds: Normal breath sounds. No wheezing or rales.  Chest:     Chest wall: No tenderness.  Abdominal:     General: Bowel sounds are normal. There is no distension.     Palpations: Abdomen is soft. There is no mass.     Tenderness: There is no abdominal tenderness. There is no guarding or rebound.  Musculoskeletal:        General: Tenderness present. Normal range of motion.     Cervical back:  Normal range of motion.  Lymphadenopathy:     Cervical: No cervical adenopathy.  Skin:    General: Skin is warm and dry.     Findings: No rash.  Neurological:     Mental Status: He is alert and oriented to person, place, and time.     Cranial Nerves: No cranial nerve deficit.     Motor: No abnormal muscle tone.     Coordination: Coordination normal.     Gait: Gait normal.     Deep Tendon Reflexes: Reflexes are normal and symmetric.  Psychiatric:        Behavior: Behavior normal.        Thought Content: Thought content normal.        Judgment: Judgment normal.   LS tender  Lab Results  Component Value Date   WBC 7.2 05/20/2019   HGB 16.0 05/20/2019   HCT 47.3 05/20/2019   PLT 269.0 05/20/2019   GLUCOSE 115 (H) 05/20/2019   CHOL 196 05/20/2019   TRIG (H) 05/20/2019    478.0 Triglyceride is over 400; calculations on Lipids are invalid.   HDL 42.20 05/20/2019   LDLDIRECT 87.0 05/20/2019   LDLCALC 98 01/04/2014   ALT 19 05/20/2019   AST 31 05/20/2019   NA 138 05/20/2019   K 3.8 05/20/2019   CL 104 05/20/2019   CREATININE 0.91 05/20/2019   BUN 25 (H) 05/20/2019   CO2 26 05/20/2019   TSH 3.03 05/20/2019   PSA 0.15 05/20/2019    ECHOCARDIOGRAM COMPLETE  Result Date: 03/09/2019   ECHOCARDIOGRAM REPORT   Patient Name:   Paul [name]. Date of Exam: 03/09/2019 Medical Rec #:  [id number]            Height:       72.0 in Accession #:    [id number]           Weight:       198.0 lb Date of Birth:  June 03, 1945            BSA:          2.12 m Patient Age:    32 years             BP:           126/70 mmHg Patient Gender: M                    HR:           65 bpm. Exam Location:  [place] Procedure: 2D Echo, Cardiac Doppler and Color Doppler Indications:    R55 Syncope  History:        Patient has no prior history of Echocardiogram examinations.  Risk Factors:Hypertension and Dyslipidemia.  Sonographer:    Wilford Sports Rodgers-Jones RDCS Referring Phys: [place]  1. Left ventricular ejection fraction, by visual estimation, is 65 to 70%. The left ventricle has hyperdynamic function. There is no left ventricular hypertrophy.  2. Left ventricular diastolic parameters are consistent with Grade I diastolic dysfunction (impaired relaxation).  3. The left ventricle has no regional wall motion abnormalities.  4. Global right ventricle has normal systolic function.The right ventricular size is normal. No increase in right ventricular wall thickness.  5. Left atrial size was normal.  6. Right atrial size was normal.  7. The mitral valve is normal in structure. Mild mitral valve regurgitation. No evidence of mitral stenosis.  8. The tricuspid valve is normal in structure.  9. The aortic valve is normal in structure. Aortic valve regurgitation is not visualized. No evidence of aortic valve sclerosis or stenosis. 10. The pulmonic valve was normal in structure. Pulmonic valve regurgitation is trivial. 11. Normal pulmonary artery systolic pressure. 12. The tricuspid regurgitant velocity is 2.26 m/s, and with an assumed right atrial pressure of 3 mmHg, the estimated right ventricular systolic pressure is normal at 23.4 mmHg. 13. The inferior vena cava is normal in size with greater than 50% respiratory variability, suggesting right atrial pressure of 3 mmHg. FINDINGS  Left Ventricle: Left ventricular ejection fraction, by visual estimation, is 65 to 70%. The left ventricle has hyperdynamic function. The left ventricle has no regional wall motion abnormalities. There is no left ventricular hypertrophy. Left ventricular diastolic parameters are consistent with Grade I diastolic dysfunction (impaired relaxation). Normal left atrial pressure. Right Ventricle: The right ventricular size is normal. No increase in right ventricular wall thickness. Global RV systolic function is has normal systolic function. The tricuspid regurgitant velocity is 2.26 m/s, and with an assumed  right atrial pressure  of 3 mmHg, the estimated right ventricular systolic pressure is normal at 23.4 mmHg. Left Atrium: Left atrial size was normal in size. Right Atrium: Right atrial size was normal in size Pericardium: There is no evidence of pericardial effusion. Mitral Valve: The mitral valve is normal in structure. Mild mitral valve regurgitation. No evidence of mitral valve stenosis by observation. Tricuspid Valve: The tricuspid valve is normal in structure. Tricuspid valve regurgitation is mild. Aortic Valve: The aortic valve is normal in structure. Aortic valve regurgitation is not visualized. The aortic valve is structurally normal, with no evidence of sclerosis or stenosis. Pulmonic Valve: The pulmonic valve was normal in structure. Pulmonic valve regurgitation is trivial. Pulmonic regurgitation is trivial. Aorta: The aortic root, ascending aorta and aortic arch are all structurally normal, with no evidence of dilitation or obstruction. Venous: The inferior vena cava is normal in size with greater than 50% respiratory variability, suggesting right atrial pressure of 3 mmHg. IAS/Shunts: No atrial level shunt detected by color flow Doppler. There is no evidence of a patent foramen ovale. No ventricular septal defect is seen or detected. There is no evidence of an atrial septal defect.  LEFT VENTRICLE PLAX 2D LVIDd:         4.10 cm  Diastology LVIDs:         2.60 cm  LV e' lateral:   8.59 cm/s LV PW:         0.90 cm  LV E/e' lateral: 8.2 LV IVS:        1.10 cm  LV e' medial:    7.72 cm/s LVOT diam:     2.20 cm  LV E/e' medial:  9.1 LV SV:         50 ml LV SV Index:   23.09 LVOT Area:     3.80 cm  RIGHT VENTRICLE RV Basal diam:  3.70 cm RV S prime:     16.60 cm/s TAPSE (M-mode): 2.3 cm LEFT ATRIUM             Index       RIGHT ATRIUM           Index LA diam:        3.90 cm 1.84 cm/m  RA Area:     12.50 cm LA Vol (A2C):   57.9 ml 27.29 ml/m RA Volume:   29.20 ml  13.76 ml/m LA Vol (A4C):   32.3 ml 15.23  ml/m LA Biplane Vol: 46.6 ml 21.97 ml/m  AORTIC VALVE LVOT Vmax:   92.85 cm/s LVOT Vmean:  66.600 cm/s LVOT VTI:    0.208 m  AORTA Ao Root diam: 4.20 cm Ao Asc diam:  3.90 cm MITRAL VALVE                        TRICUSPID VALVE MV Area (PHT): 4.21 cm             TR Peak grad:   20.4 mmHg MV PHT:        52.20 msec           TR Vmax:        226.00 cm/s MV Decel Time: 180 msec MV E velocity: 70.30 cm/s 103 cm/s  SHUNTS MV A velocity: 73.30 cm/s 70.3 cm/s Systemic VTI:  0.21 m MV E/A ratio:  0.96       1.5       Systemic Diam: 2.20 cm  Ena Dawley MD Electronically signed by Ena Dawley MD Signature Date/Time: 03/09/2019/4:18:25 PM    Final    VAS US CAROTID  Result Date: 03/09/2019 Carotid Arterial Duplex Study Indications:       Patient reports intermittent dizziness x 1 year and one                    presyncope episode occurring a couple of weeks ago. He                    relates his symptoms to blood pressure medication. He states                    since his prescription has be halved he has not experienced                    any of the symptoms. He denies any other cerebrovascular                    symptoms. Risk Factors:      Hypertension, hyperlipidemia, past history of smoking. Comparison Study:  NA Performing Technologist: Sharlett Iles RVT  Examination Guidelines: A complete evaluation includes B-mode imaging, spectral Doppler, color Doppler, and power Doppler as needed of all accessible portions of each vessel. Bilateral testing is considered an integral part of a complete examination. Limited examinations for reoccurring indications may be performed as noted.  Right Carotid Findings: +----------+--------+--------+--------+------------------+--------+           PSV cm/sEDV cm/sStenosisPlaque DescriptionComments +----------+--------+--------+--------+------------------+--------+ CCA Prox  126     14                                          +----------+--------+--------+--------+------------------+--------+  CCA Distal94      11                                         +----------+--------+--------+--------+------------------+--------+ ICA Prox  84      15                                         +----------+--------+--------+--------+------------------+--------+ ICA Mid   74      17                                         +----------+--------+--------+--------+------------------+--------+ ICA Distal79      21                                         +----------+--------+--------+--------+------------------+--------+ ECA       132     9                                          +----------+--------+--------+--------+------------------+--------+ +----------+--------+-------+----------------+-------------------+           PSV cm/sEDV cmsDescribe        Arm Pressure (mmHG) +----------+--------+-------+----------------+-------------------+ [id number]            Multiphasic, [id number]                 +----------+--------+-------+----------------+-------------------+ +---------+--------+--+--------+-+---------+ VertebralPSV cm/s36EDV cm/s7Antegrade +---------+--------+--+--------+-+---------+  Left Carotid Findings: +----------+--------+--------+--------+------------------+------------------+           PSV cm/sEDV cm/sStenosisPlaque DescriptionComments           +----------+--------+--------+--------+------------------+------------------+ CCA Prox  118     14                                                   +----------+--------+--------+--------+------------------+------------------+ CCA Distal88      16                                                   +----------+--------+--------+--------+------------------+------------------+ ICA Prox  81      18      1-39%   heterogenous      intimal thickening +----------+--------+--------+--------+------------------+------------------+ ICA  Mid   68      22                                                   +----------+--------+--------+--------+------------------+------------------+ ICA Distal85      29                                                   +----------+--------+--------+--------+------------------+------------------+  ECA       90      9               heterogenous                         +----------+--------+--------+--------+------------------+------------------+ +----------+--------+--------+----------------+-------------------+           PSV cm/sEDV cm/sDescribe        Arm Pressure (mmHG) +----------+--------+--------+----------------+-------------------+ [id number]             Multiphasic, [id number]                 +----------+--------+--------+----------------+-------------------+ +---------+--------+--+--------+-+---------+ VertebralPSV cm/s34EDV cm/s7Antegrade +---------+--------+--+--------+-+---------+  Summary: Right Carotid: There was no evidence of thrombus, dissection, atherosclerotic                plaque or stenosis in the cervical carotid system. Left Carotid: Velocities in the left ICA are consistent with a 1-39% stenosis. Vertebrals:  Bilateral vertebral arteries demonstrate antegrade flow. Subclavians: Normal flow hemodynamics were seen in bilateral subclavian              arteries. *See table(s) above for measurements and observations.  Electronically signed by Jenkins Rouge MD on 03/09/2019 at 11:40:33 AM.    Final     Assessment & Plan:    Walker Kehr, MD

## 2019-11-23 NOTE — Assessment & Plan Note (Signed)
Zolpidem prn  Potential benefits of a long term benzodiazepines  use as well as potential risks  and complications were explained to the patient and were aknowledged. 

## 2019-11-23 NOTE — Assessment & Plan Note (Addendum)
On [name] Maxzide - reduce to 1/2 tab a day due to dizziness

## 2020-02-18 ENCOUNTER — Ambulatory Visit (AMBULATORY_SURGERY_CENTER): Payer: Self-pay

## 2020-02-18 ENCOUNTER — Other Ambulatory Visit: Payer: Self-pay

## 2020-02-18 VITALS — Ht 72.0 in | Wt 200.6 lb

## 2020-02-18 DIAGNOSIS — Z800 Family history of malignant neoplasm of digestive organs: Secondary | ICD-10-CM

## 2020-02-18 DIAGNOSIS — Z86010 Personal history of colonic polyps: Secondary | ICD-10-CM

## 2020-02-18 MED ORDER — SUTAB 1479-225-188 MG PO TABS
12.0000 | ORAL_TABLET | ORAL | 0 refills | Status: DC
Start: 2020-02-18 — End: 2020-02-23

## 2020-02-18 NOTE — Progress Notes (Signed)
No egg or soy allergy known to patient  No issues with past sedation with any surgeries or procedures no intubation problems in the past  No FH of Malignant Hyperthermia No diet pills per patient No home 02 use per patient  No blood thinners per patient  Pt denies issues with constipation  No A fib or A flutter  EMMI video to pt or via [place] 19 guidelines implemented in PV today with Pt and RN   Sutab Coupon given to pt in PV today , Code to Pharmacy   Due to the COVID-19 pandemic we are asking patients to follow these guidelines. Please only bring one care partner. Please be aware that your care partner may wait in the car in the parking lot or if they feel like they will be too hot to wait in the car, they may wait in the lobby on the 4th floor. All care partners are required to wear a mask the entire time (we do not have any that we can provide them), they need to practice social distancing, and we will do a Covid check for all patient's and care partners when you arrive. Also we will check their temperature and your temperature. If the care partner waits in their car they need to stay in the parking lot the entire time and we will call them on their cell phone when the patient is ready for discharge so they can bring the car to the front of the building. Also all patient's will need to wear a mask into building.

## 2020-02-23 ENCOUNTER — Other Ambulatory Visit: Payer: Self-pay

## 2020-02-23 ENCOUNTER — Encounter: Payer: Self-pay | Admitting: Internal Medicine

## 2020-02-23 ENCOUNTER — Ambulatory Visit: Payer: 59 | Admitting: Internal Medicine

## 2020-02-23 DIAGNOSIS — E785 Hyperlipidemia, unspecified: Secondary | ICD-10-CM | POA: Diagnosis not present

## 2020-02-23 DIAGNOSIS — G4700 Insomnia, unspecified: Secondary | ICD-10-CM

## 2020-02-23 DIAGNOSIS — L570 Actinic keratosis: Secondary | ICD-10-CM | POA: Diagnosis not present

## 2020-02-23 DIAGNOSIS — M5412 Radiculopathy, cervical region: Secondary | ICD-10-CM

## 2020-02-23 DIAGNOSIS — I10 Essential (primary) hypertension: Secondary | ICD-10-CM

## 2020-02-23 DIAGNOSIS — L821 Other seborrheic keratosis: Secondary | ICD-10-CM | POA: Diagnosis not present

## 2020-02-23 DIAGNOSIS — B353 Tinea pedis: Secondary | ICD-10-CM | POA: Diagnosis not present

## 2020-02-23 DIAGNOSIS — D485 Neoplasm of uncertain behavior of skin: Secondary | ICD-10-CM | POA: Diagnosis not present

## 2020-02-23 DIAGNOSIS — L578 Other skin changes due to chronic exposure to nonionizing radiation: Secondary | ICD-10-CM | POA: Diagnosis not present

## 2020-02-23 DIAGNOSIS — D0462 Carcinoma in situ of skin of left upper limb, including shoulder: Secondary | ICD-10-CM | POA: Diagnosis not present

## 2020-02-23 DIAGNOSIS — D225 Melanocytic nevi of trunk: Secondary | ICD-10-CM | POA: Diagnosis not present

## 2020-02-23 NOTE — Patient Instructions (Signed)
   B-complex with Niacin 100 mg    Lion's mane  

## 2020-02-23 NOTE — Assessment & Plan Note (Addendum)
Stable  T#4 prn  Potential benefits of a long term opioids use as well as potential risks (i.e. addiction risk, apnea etc) and complications (i.e. Somnolence, constipation and others) were explained to the patient and were aknowledged. CBD oil Try Lion's mane

## 2020-02-23 NOTE — Assessment & Plan Note (Signed)
[name], Maxzide

## 2020-02-23 NOTE — Assessment & Plan Note (Signed)
Zolpidem prn  Potential benefits of a long term benzodiazepines  use as well as potential risks  and complications were explained to the patient and were aknowledged. 

## 2020-02-23 NOTE — Assessment & Plan Note (Signed)
On Lipitor 

## 2020-02-23 NOTE — Progress Notes (Signed)
Subjective:  Patient ID: Paul Baron., male    DOB: [birth year]  Age: 74 y.o. MRN: [id number]  CC: Follow-up (3 month f/u)   HPI Paul Baron. presents for LBP, depression, insomnia C/o stress w/work  Outpatient Medications Prior to Visit  Medication Sig Dispense Refill  . atorvastatin (LIPITOR) 10 MG tablet Take 1 tablet by mouth daily. 90 tablet 1  . buPROPion (WELLBUTRIN XL) 300 MG 24 hr tablet Take 1 tablet by mouth daily. 90 tablet 1  . fenofibrate (TRICOR) 145 MG tablet Take 1 tablet (145 mg total) by mouth daily. 28 tablet 11  . finasteride (PROSCAR) 5 MG tablet Take 1 tablet by mouth daily. 90 tablet 3  . pantoprazole (PROTONIX) 40 MG tablet Take 1 tablet by mouth daily. 90 tablet 3  . sildenafil (VIAGRA) 100 MG tablet Take 1 tablet (100 mg total) by mouth daily as needed for erectile dysfunction. 12 tablet 3  . triamterene-hydrochlorothiazide (MAXZIDE-25) 37.5-25 MG tablet Take 1 tablet by mouth daily. 90 tablet 3  . zolpidem (AMBIEN) 10 MG tablet TAKE 1 TABLET(10 MG) BY MOUTH AT BEDTIME AS NEEDED FOR SLEEP 90 tablet 1  . acetaminophen-codeine (TYLENOL #4) 300-60 MG tablet Take 1 tablet by mouth every 12 (twelve) hours as needed. for pain (Patient not taking: Reported on 02/23/2020) 60 tablet 2  . amLODipine-olmesartan ([name]) 5-40 MG tablet Take 1 tablet by mouth daily. (Patient not taking: Reported on 02/23/2020) 90 tablet 1  . Cholecalciferol 1000 UNITS tablet Take 1,000 Units by mouth daily. (Patient not taking: Reported on 02/23/2020)    . Cyanocobalamin (VITAMIN B 12 PO) Take 1 tablet by mouth. (Patient not taking: Reported on 02/23/2020)    . SUTAB [id number] MG TABS Take 12 tablets by mouth as directed. MANUFACTURER CODES!! BIN: [id number] PCN: CN GROUP: [id number] MEMBER ID: [id number];[employer] ;NO PRIOR AUTHORIZATION (Patient not taking: Reported on 02/23/2020) 24 tablet 0   No facility-administered medications prior to visit.     ROS: Review of Systems  Constitutional: Negative for appetite change, fatigue and unexpected weight change.  HENT: Negative for congestion, nosebleeds, sneezing, sore throat and trouble swallowing.   Eyes: Negative for itching and visual disturbance.  Respiratory: Negative for cough.   Cardiovascular: Negative for chest pain, palpitations and leg swelling.  Gastrointestinal: Negative for abdominal distention, blood in stool, diarrhea and nausea.  Genitourinary: Negative for frequency and hematuria.  Musculoskeletal: Positive for back pain. Negative for gait problem, joint swelling and neck pain.  Skin: Negative for rash.  Neurological: Negative for dizziness, tremors, speech difficulty and weakness.  Psychiatric/Behavioral: Negative for agitation, dysphoric mood and sleep disturbance. The patient is not nervous/anxious.     Objective:  BP 138/82 (BP Location: Left Arm)   Pulse 68   Temp 98.2 F (36.8 C) (Oral)   Wt 201 lb 3.2 oz (91.3 kg)   SpO2 99%   BMI 27.29 kg/m   BP Readings from Last 3 Encounters:  02/23/20 138/82  11/23/19 (!) 148/80  05/20/19 136/76    Wt Readings from Last 3 Encounters:  02/23/20 201 lb 3.2 oz (91.3 kg)  02/18/20 200 lb 9.6 oz (91 kg)  11/23/19 201 lb (91.2 kg)    Physical Exam Constitutional:      General: He is not in acute distress.    Appearance: He is well-developed.     Comments: NAD  HENT:     Mouth/Throat:     Mouth: Oropharynx is clear and moist.  Eyes:     Conjunctiva/sclera: Conjunctivae normal.     Pupils: Pupils are equal, round, and reactive to light.  Neck:     Thyroid: No thyromegaly.     Vascular: No JVD.  Cardiovascular:     Rate and Rhythm: Normal rate and regular rhythm.     Pulses: Intact distal pulses.     Heart sounds: Normal heart sounds. No murmur heard. No friction rub. No gallop.   Pulmonary:     Effort: Pulmonary effort is normal. No respiratory distress.     Breath sounds: Normal breath sounds. No  wheezing or rales.  Chest:     Chest wall: No tenderness.  Abdominal:     General: Bowel sounds are normal. There is no distension.     Palpations: Abdomen is soft. There is no mass.     Tenderness: There is no abdominal tenderness. There is no guarding or rebound.  Musculoskeletal:        General: Tenderness present. No edema. Normal range of motion.     Cervical back: Normal range of motion.  Lymphadenopathy:     Cervical: No cervical adenopathy.  Skin:    General: Skin is warm and dry.     Findings: No rash.  Neurological:     Mental Status: He is alert and oriented to person, place, and time.     Cranial Nerves: No cranial nerve deficit.     Motor: No abnormal muscle tone.     Coordination: He displays a negative Romberg sign. Coordination normal.     Gait: Gait normal.     Deep Tendon Reflexes: Reflexes are normal and symmetric.  Psychiatric:        Mood and Affect: Mood and affect normal.        Behavior: Behavior normal.        Thought Content: Thought content normal.        Judgment: Judgment normal.     Lab Results  Component Value Date   WBC 7.2 05/20/2019   HGB 16.0 05/20/2019   HCT 47.3 05/20/2019   PLT 269.0 05/20/2019   GLUCOSE 115 (H) 05/20/2019   CHOL 196 05/20/2019   TRIG (H) 05/20/2019    478.0 Triglyceride is over 400; calculations on Lipids are invalid.   HDL 42.20 05/20/2019   LDLDIRECT 87.0 05/20/2019   LDLCALC 98 01/04/2014   ALT 19 05/20/2019   AST 31 05/20/2019   NA 138 05/20/2019   K 3.8 05/20/2019   CL 104 05/20/2019   CREATININE 0.91 05/20/2019   BUN 25 (H) 05/20/2019   CO2 26 05/20/2019   TSH 3.03 05/20/2019   PSA 0.15 05/20/2019    ECHOCARDIOGRAM COMPLETE  Result Date: 03/09/2019   ECHOCARDIOGRAM REPORT   Patient Name:   Paul Colquhoun. Date of Exam: 03/09/2019 Medical Rec #:  [id number]            Height:       72.0 in Accession #:    [id number]           Weight:       198.0 lb Date of Birth:  06-27-1945            BSA:           2.12 m Patient Age:    24 years             BP:           126/70 mmHg Patient Gender: M  HR:           65 bpm. Exam Location:  [address] Procedure: 2D Echo, Cardiac Doppler and Color Doppler Indications:    R55 Syncope  History:        Patient has no prior history of Echocardiogram examinations.                 Risk Factors:Hypertension and Dyslipidemia.  Sonographer:    Wilford Sports Rodgers-Jones RDCS Referring Phys: [place]  1. Left ventricular ejection fraction, by visual estimation, is 65 to 70%. The left ventricle has hyperdynamic function. There is no left ventricular hypertrophy.  2. Left ventricular diastolic parameters are consistent with Grade I diastolic dysfunction (impaired relaxation).  3. The left ventricle has no regional wall motion abnormalities.  4. Global right ventricle has normal systolic function.The right ventricular size is normal. No increase in right ventricular wall thickness.  5. Left atrial size was normal.  6. Right atrial size was normal.  7. The mitral valve is normal in structure. Mild mitral valve regurgitation. No evidence of mitral stenosis.  8. The tricuspid valve is normal in structure.  9. The aortic valve is normal in structure. Aortic valve regurgitation is not visualized. No evidence of aortic valve sclerosis or stenosis. 10. The pulmonic valve was normal in structure. Pulmonic valve regurgitation is trivial. 11. Normal pulmonary artery systolic pressure. 12. The tricuspid regurgitant velocity is 2.26 m/s, and with an assumed right atrial pressure of 3 mmHg, the estimated right ventricular systolic pressure is normal at 23.4 mmHg. 13. The inferior vena cava is normal in size with greater than 50% respiratory variability, suggesting right atrial pressure of 3 mmHg. FINDINGS  Left Ventricle: Left ventricular ejection fraction, by visual estimation, is 65 to 70%. The left ventricle has hyperdynamic function. The left ventricle  has no regional wall motion abnormalities. There is no left ventricular hypertrophy. Left ventricular diastolic parameters are consistent with Grade I diastolic dysfunction (impaired relaxation). Normal left atrial pressure. Right Ventricle: The right ventricular size is normal. No increase in right ventricular wall thickness. Global RV systolic function is has normal systolic function. The tricuspid regurgitant velocity is 2.26 m/s, and with an assumed right atrial pressure  of 3 mmHg, the estimated right ventricular systolic pressure is normal at 23.4 mmHg. Left Atrium: Left atrial size was normal in size. Right Atrium: Right atrial size was normal in size Pericardium: There is no evidence of pericardial effusion. Mitral Valve: The mitral valve is normal in structure. Mild mitral valve regurgitation. No evidence of mitral valve stenosis by observation. Tricuspid Valve: The tricuspid valve is normal in structure. Tricuspid valve regurgitation is mild. Aortic Valve: The aortic valve is normal in structure. Aortic valve regurgitation is not visualized. The aortic valve is structurally normal, with no evidence of sclerosis or stenosis. Pulmonic Valve: The pulmonic valve was normal in structure. Pulmonic valve regurgitation is trivial. Pulmonic regurgitation is trivial. Aorta: The aortic root, ascending aorta and aortic arch are all structurally normal, with no evidence of dilitation or obstruction. Venous: The inferior vena cava is normal in size with greater than 50% respiratory variability, suggesting right atrial pressure of 3 mmHg. IAS/Shunts: No atrial level shunt detected by color flow Doppler. There is no evidence of a patent foramen ovale. No ventricular septal defect is seen or detected. There is no evidence of an atrial septal defect.  LEFT VENTRICLE PLAX 2D LVIDd:         4.10 cm  Diastology  LVIDs:         2.60 cm  LV e' lateral:   8.59 cm/s LV PW:         0.90 cm  LV E/e' lateral: 8.2 LV IVS:        1.10  cm  LV e' medial:    7.72 cm/s LVOT diam:     2.20 cm  LV E/e' medial:  9.1 LV SV:         50 ml LV SV Index:   23.09 LVOT Area:     3.80 cm  RIGHT VENTRICLE RV Basal diam:  3.70 cm RV S prime:     16.60 cm/s TAPSE (M-mode): 2.3 cm LEFT ATRIUM             Index       RIGHT ATRIUM           Index LA diam:        3.90 cm 1.84 cm/m  RA Area:     12.50 cm LA Vol (A2C):   57.9 ml 27.29 ml/m RA Volume:   29.20 ml  13.76 ml/m LA Vol (A4C):   32.3 ml 15.23 ml/m LA Biplane Vol: 46.6 ml 21.97 ml/m  AORTIC VALVE LVOT Vmax:   92.85 cm/s LVOT Vmean:  66.600 cm/s LVOT VTI:    0.208 m  AORTA Ao Root diam: 4.20 cm Ao Asc diam:  3.90 cm MITRAL VALVE                        TRICUSPID VALVE MV Area (PHT): 4.21 cm             TR Peak grad:   20.4 mmHg MV PHT:        52.20 msec           TR Vmax:        226.00 cm/s MV Decel Time: 180 msec MV E velocity: 70.30 cm/s 103 cm/s  SHUNTS MV A velocity: 73.30 cm/s 70.3 cm/s Systemic VTI:  0.21 m MV E/A ratio:  0.96       1.5       Systemic Diam: 2.20 cm  Ena Dawley MD Electronically signed by Ena Dawley MD Signature Date/Time: 03/09/2019/4:18:25 PM    Final    VAS US CAROTID  Result Date: 03/09/2019 Carotid Arterial Duplex Study Indications:       Patient reports intermittent dizziness x 1 year and one                    presyncope episode occurring a couple of weeks ago. He                    relates his symptoms to blood pressure medication. He states                    since his prescription has be halved he has not experienced                    any of the symptoms. He denies any other cerebrovascular                    symptoms. Risk Factors:      Hypertension, hyperlipidemia, past history of smoking. Comparison Study:  NA Performing Technologist: Sharlett Iles RVT  Examination Guidelines: A complete evaluation includes B-mode imaging, spectral Doppler, color Doppler, and power Doppler as needed of all accessible portions of  each vessel. Bilateral testing is  considered an integral part of a complete examination. Limited examinations for reoccurring indications may be performed as noted.  Right Carotid Findings: +----------+--------+--------+--------+------------------+--------+           PSV cm/sEDV cm/sStenosisPlaque DescriptionComments +----------+--------+--------+--------+------------------+--------+ CCA Prox  126     14                                         +----------+--------+--------+--------+------------------+--------+ CCA Distal94      11                                         +----------+--------+--------+--------+------------------+--------+ ICA Prox  84      15                                         +----------+--------+--------+--------+------------------+--------+ ICA Mid   74      17                                         +----------+--------+--------+--------+------------------+--------+ ICA Distal79      21                                         +----------+--------+--------+--------+------------------+--------+ ECA       132     9                                          +----------+--------+--------+--------+------------------+--------+ +----------+--------+-------+----------------+-------------------+           PSV cm/sEDV cmsDescribe        Arm Pressure (mmHG) +----------+--------+-------+----------------+-------------------+ [id number]            Multiphasic, [id number]                 +----------+--------+-------+----------------+-------------------+ +---------+--------+--+--------+-+---------+ VertebralPSV cm/s36EDV cm/s7Antegrade +---------+--------+--+--------+-+---------+  Left Carotid Findings: +----------+--------+--------+--------+------------------+------------------+           PSV cm/sEDV cm/sStenosisPlaque DescriptionComments           +----------+--------+--------+--------+------------------+------------------+ CCA Prox  118     14                                                    +----------+--------+--------+--------+------------------+------------------+ CCA Distal88      16                                                   +----------+--------+--------+--------+------------------+------------------+ ICA Prox  81      18      1-39%   heterogenous      intimal thickening +----------+--------+--------+--------+------------------+------------------+ ICA Mid   68      22                                                   +----------+--------+--------+--------+------------------+------------------+  ICA Distal85      29                                                   +----------+--------+--------+--------+------------------+------------------+ ECA       90      9               heterogenous                         +----------+--------+--------+--------+------------------+------------------+ +----------+--------+--------+----------------+-------------------+           PSV cm/sEDV cm/sDescribe        Arm Pressure (mmHG) +----------+--------+--------+----------------+-------------------+ [id number]             Multiphasic, [id number]                 +----------+--------+--------+----------------+-------------------+ +---------+--------+--+--------+-+---------+ VertebralPSV cm/s34EDV cm/s7Antegrade +---------+--------+--+--------+-+---------+  Summary: Right Carotid: There was no evidence of thrombus, dissection, atherosclerotic                plaque or stenosis in the cervical carotid system. Left Carotid: Velocities in the left ICA are consistent with a 1-39% stenosis. Vertebrals:  Bilateral vertebral arteries demonstrate antegrade flow. Subclavians: Normal flow hemodynamics were seen in bilateral subclavian              arteries. *See table(s) above for measurements and observations.  Electronically signed by Jenkins Rouge MD on 03/09/2019 at 11:40:33 AM.    Final     Assessment & Plan:    Follow-up: No  follow-ups on file.  Walker Kehr, MD

## 2020-02-25 ENCOUNTER — Encounter: Payer: Self-pay | Admitting: Internal Medicine

## 2020-03-02 ENCOUNTER — Other Ambulatory Visit: Payer: Self-pay | Admitting: Internal Medicine

## 2020-03-03 ENCOUNTER — Ambulatory Visit (AMBULATORY_SURGERY_CENTER): Payer: BC Managed Care – PPO | Admitting: Internal Medicine

## 2020-03-03 ENCOUNTER — Encounter: Payer: Self-pay | Admitting: Internal Medicine

## 2020-03-03 ENCOUNTER — Other Ambulatory Visit: Payer: Self-pay

## 2020-03-03 ENCOUNTER — Other Ambulatory Visit: Payer: Self-pay | Admitting: Internal Medicine

## 2020-03-03 VITALS — BP 112/68 | HR 69 | Temp 96.9°F | Resp 14

## 2020-03-03 DIAGNOSIS — Z800 Family history of malignant neoplasm of digestive organs: Secondary | ICD-10-CM

## 2020-03-03 DIAGNOSIS — Z860100 Personal history of colon polyps, unspecified: Secondary | ICD-10-CM

## 2020-03-03 DIAGNOSIS — Z1211 Encounter for screening for malignant neoplasm of colon: Secondary | ICD-10-CM | POA: Diagnosis not present

## 2020-03-03 DIAGNOSIS — D122 Benign neoplasm of ascending colon: Secondary | ICD-10-CM | POA: Diagnosis not present

## 2020-03-03 MED ORDER — SODIUM CHLORIDE 0.9 % IV SOLN: 500.0000 mL | Freq: Once | INTRAVENOUS | Status: DC

## 2020-03-03 NOTE — Progress Notes (Signed)
Vitals-NS  Pt's states no medical or surgical changes since previsit or office visit. 

## 2020-03-03 NOTE — Op Note (Signed)
[facility] Patient Name: Paul [name] Procedure Date: 03/03/2020 8:09 AM MRN: [id number] Endoscopist: Docia Chuck. Henrene Pastor , MD Age: 74 Referring MD:  Date of Birth: May 14, 1945 Gender: Male Account #: [id number] Procedure:                Colonoscopy with cold snare polypectomy x 3 Indications:              High risk colon cancer surveillance: Personal                            history of multiple (3 or more) adenomas. Father                            with a history of colon cancer around age 73.                            Previous examinations 1998, 2003, 2011, 2015 Medicines:                Monitored Anesthesia Care Procedure:                Pre-Anesthesia Assessment:                           - Prior to the procedure, a History and Physical                            was performed, and patient medications and                            allergies were reviewed. The patient's tolerance of                            previous anesthesia was also reviewed. The risks                            and benefits of the procedure and the sedation                            options and risks were discussed with the patient.                            All questions were answered, and informed consent                            was obtained. Prior Anticoagulants: The patient has                            taken no previous anticoagulant or antiplatelet                            agents. ASA Grade Assessment: II - A patient with                            mild systemic disease. After reviewing the risks  and benefits, the patient was deemed in                            satisfactory condition to undergo the procedure.                           After obtaining informed consent, the colonoscope                            was passed under direct vision. Throughout the                            procedure, the patient's blood pressure, pulse, and                             oxygen saturations were monitored continuously. The                            Olympus CF-HQ190L [id number]) Colonoscope was                            introduced through the anus and advanced to the the                            cecum, identified by appendiceal orifice and                            ileocecal valve. The ileocecal valve, appendiceal                            orifice, and rectum were photographed. The quality                            of the bowel preparation was excellent. The                            colonoscopy was performed without difficulty. The                            patient tolerated the procedure well. The bowel                            preparation used was SUPREP via split dose                            instruction. Scope In: 8:45:49 AM Scope Out: 9:01:02 AM Scope Withdrawal Time: 0 hours 12 minutes 42 seconds  Total Procedure Duration: 0 hours 15 minutes 13 seconds  Findings:                 Three polyps were found in the ascending colon. The                            polyps were 1 to 3 mm in size. These polyps were  removed with a cold snare. Resection and retrieval                            were complete.                           A few diverticula were found in the colon.                           The exam was otherwise without abnormality on                            direct and retroflexion views. Complications:            No immediate complications. Estimated blood loss:                            None. Estimated Blood Loss:     Estimated blood loss: none. Impression:               - Three 1 to 3 mm polyps in the ascending colon,                            removed with a cold snare. Resected and retrieved.                           - Diverticulosis.                           - The examination was otherwise normal on direct                            and retroflexion views. Recommendation:           - Repeat  colonoscopy in 5 years for surveillance.                           - Patient has a contact number available for                            emergencies. The signs and symptoms of potential                            delayed complications were discussed with the                            patient. Return to normal activities tomorrow.                            Written discharge instructions were provided to the                            patient.                           - Resume previous diet.                           -  Continue present medications.                           - Await pathology results. Docia Chuck. Henrene Pastor, MD 03/03/2020 9:20:07 AM This report has been signed electronically.

## 2020-03-03 NOTE — Progress Notes (Signed)
Called to room to assist during endoscopic procedure.  Patient ID and intended procedure confirmed with present staff. Received instructions for my participation in the procedure from the performing physician.  

## 2020-03-03 NOTE — Progress Notes (Signed)
PT taken to PACU. Monitors in place. VSS. Report given to RN. 

## 2020-03-03 NOTE — Patient Instructions (Signed)
Handouts provided on polyps and diverticulosis.  ? ?YOU HAD AN ENDOSCOPIC PROCEDURE TODAY AT THE [facility]:   Refer to the procedure report that was given to you for any specific questions about what was found during the examination.  If the procedure report does not answer your questions, please call your gastroenterologist to clarify.  If you requested that your care partner not be given the details of your procedure findings, then the procedure report has been included in a sealed envelope for you to review at your convenience later. ? ?YOU SHOULD EXPECT: Some feelings of bloating in the abdomen. Passage of more gas than usual.  Walking can help get rid of the air that was put into your GI tract during the procedure and reduce the bloating. If you had a lower endoscopy (such as a colonoscopy or flexible sigmoidoscopy) you may notice spotting of blood in your stool or on the toilet paper. If you underwent a bowel prep for your procedure, you may not have a normal bowel movement for a few days. ? ?Please Note:  You might notice some irritation and congestion in your nose or some drainage.  This is from the oxygen used during your procedure.  There is no need for concern and it should clear up in a day or so. ? ?SYMPTOMS TO REPORT IMMEDIATELY: ? ?Following lower endoscopy (colonoscopy or flexible sigmoidoscopy): ? Excessive amounts of blood in the stool ? Significant tenderness or worsening of abdominal pains ? Swelling of the abdomen that is new, acute ? Fever of 100?F or higher ? ?For urgent or emergent issues, a gastroenterologist can be reached at any hour by calling [phone]. ?Do not use MyChart messaging for urgent concerns.  ? ? ?DIET:  We do recommend a small meal at first, but then you may proceed to your regular diet.  Drink plenty of fluids but you should avoid alcoholic beverages for 24 hours. ? ?ACTIVITY:  You should plan to take it easy for the rest of today and you should NOT  DRIVE or use heavy machinery until tomorrow (because of the sedation medicines used during the test).   ? ?FOLLOW UP: ?Our staff will call the number listed on your records 48-72 hours following your procedure to check on you and address any questions or concerns that you may have regarding the information given to you following your procedure. If we do not reach you, we will leave a message.  We will attempt to reach you two times.  During this call, we will ask if you have developed any symptoms of COVID 19. If you develop any symptoms (ie: fever, flu-like symptoms, shortness of breath, cough etc.) before then, please call [phone].  If you test positive for Covid 19 in the 2 weeks post procedure, please call and report this information to us.   ? ?If any biopsies were taken you will be contacted by phone or by letter within the next 1-3 weeks.  Please call us at [phone] if you have not heard about the biopsies in 3 weeks.  ? ? ?SIGNATURES/CONFIDENTIALITY: ?You and/or your care partner have signed paperwork which will be entered into your electronic medical record.  These signatures attest to the fact that that the information above on your After Visit Summary has been reviewed and is understood.  Full responsibility of the confidentiality of this discharge information lies with you and/or your care-partner. ? ?

## 2020-03-07 ENCOUNTER — Telehealth: Payer: Self-pay

## 2020-03-07 NOTE — Telephone Encounter (Signed)
NO ANSWER, MESSAGE LEFT FOR PATIENT. 

## 2020-03-10 ENCOUNTER — Telehealth: Payer: Self-pay

## 2020-03-10 DIAGNOSIS — D0462 Carcinoma in situ of skin of left upper limb, including shoulder: Secondary | ICD-10-CM | POA: Diagnosis not present

## 2020-03-10 DIAGNOSIS — C44329 Squamous cell carcinoma of skin of other parts of face: Secondary | ICD-10-CM | POA: Diagnosis not present

## 2020-03-10 NOTE — Telephone Encounter (Signed)
Prior Authorization has been submitted.

## 2020-03-12 ENCOUNTER — Other Ambulatory Visit: Payer: Self-pay | Admitting: Internal Medicine

## 2020-03-15 ENCOUNTER — Encounter: Payer: Self-pay | Admitting: Internal Medicine

## 2020-03-18 ENCOUNTER — Other Ambulatory Visit: Payer: Self-pay | Admitting: Internal Medicine

## 2020-03-18 MED ORDER — PANTOPRAZOLE SODIUM 40 MG PO TBEC: 40.0000 mg | DELAYED_RELEASE_TABLET | Freq: Every day | ORAL | 3 refills | Status: DC

## 2020-03-18 MED ORDER — FENOFIBRATE 145 MG PO TABS: 145.0000 mg | ORAL_TABLET | Freq: Every day | ORAL | 3 refills | Status: DC

## 2020-05-02 ENCOUNTER — Telehealth: Payer: Self-pay | Admitting: Internal Medicine

## 2020-05-02 NOTE — Telephone Encounter (Signed)
Error

## 2020-05-03 ENCOUNTER — Other Ambulatory Visit: Payer: Self-pay | Admitting: Internal Medicine

## 2020-05-06 ENCOUNTER — Telehealth: Payer: Self-pay | Admitting: Internal Medicine

## 2020-05-06 MED ORDER — PANTOPRAZOLE SODIUM 40 MG PO TBEC: 40.0000 mg | DELAYED_RELEASE_TABLET | Freq: Every day | ORAL | 3 refills | Status: DC

## 2020-05-06 NOTE — Telephone Encounter (Signed)
pantoprazole (PROTONIX) 40 MG tablet  [facility] [place], [place] Phone:  [phone]  Fax:  [phone]     Pharmacy calling requesting a refill

## 2020-05-06 NOTE — Telephone Encounter (Signed)
Rx was sent, but received PA which was completed via cover-my-meds w/ Key: [id number]. Waiting on approval status.Marland KitchenJohny Chess

## 2020-05-06 NOTE — Telephone Encounter (Signed)
Rec's PA back med was approved w/approval dates Effective from 05/06/2020 through 05/05/2021...Johny Chess

## 2020-05-25 ENCOUNTER — Other Ambulatory Visit: Payer: Self-pay | Admitting: Internal Medicine

## 2020-05-27 ENCOUNTER — Other Ambulatory Visit: Payer: Self-pay

## 2020-05-30 ENCOUNTER — Ambulatory Visit: Payer: BC Managed Care – PPO | Admitting: Internal Medicine

## 2020-05-30 ENCOUNTER — Encounter: Payer: Self-pay | Admitting: Internal Medicine

## 2020-05-30 ENCOUNTER — Other Ambulatory Visit: Payer: Self-pay

## 2020-05-30 VITALS — BP 142/80 | HR 64 | Temp 98.0°F | Ht 72.0 in | Wt 196.8 lb

## 2020-05-30 DIAGNOSIS — F988 Other specified behavioral and emotional disorders with onset usually occurring in childhood and adolescence: Secondary | ICD-10-CM

## 2020-05-30 DIAGNOSIS — F419 Anxiety disorder, unspecified: Secondary | ICD-10-CM

## 2020-05-30 DIAGNOSIS — Z0000 Encounter for general adult medical examination without abnormal findings: Secondary | ICD-10-CM

## 2020-05-30 DIAGNOSIS — I2510 Atherosclerotic heart disease of native coronary artery without angina pectoris: Secondary | ICD-10-CM | POA: Diagnosis not present

## 2020-05-30 DIAGNOSIS — E785 Hyperlipidemia, unspecified: Secondary | ICD-10-CM | POA: Diagnosis not present

## 2020-05-30 DIAGNOSIS — H9193 Unspecified hearing loss, bilateral: Secondary | ICD-10-CM

## 2020-05-30 DIAGNOSIS — B351 Tinea unguium: Secondary | ICD-10-CM

## 2020-05-30 DIAGNOSIS — H9190 Unspecified hearing loss, unspecified ear: Secondary | ICD-10-CM | POA: Insufficient documentation

## 2020-05-30 DIAGNOSIS — I10 Essential (primary) hypertension: Secondary | ICD-10-CM

## 2020-05-30 DIAGNOSIS — I2583 Coronary atherosclerosis due to lipid rich plaque: Secondary | ICD-10-CM

## 2020-05-30 DIAGNOSIS — Z125 Encounter for screening for malignant neoplasm of prostate: Secondary | ICD-10-CM | POA: Diagnosis not present

## 2020-05-30 LAB — COMPREHENSIVE METABOLIC PANEL
ALT: 22 U/L (ref 0–53)
AST: 37 U/L (ref 0–37)
Albumin: 4.5 g/dL (ref 3.5–5.2)
Alkaline Phosphatase: 54 U/L (ref 39–117)
BUN: 21 mg/dL (ref 6–23)
CO2: 29 mEq/L (ref 19–32)
Calcium: 9.6 mg/dL (ref 8.4–10.5)
Chloride: 102 mEq/L (ref 96–112)
Creatinine, Ser: 1 mg/dL (ref 0.40–1.50)
GFR: 73.46 mL/min (ref >60.00)
Glucose, Bld: 109 mg/dL — ABNORMAL HIGH (ref 70–99)
Potassium: 4.2 mEq/L (ref 3.5–5.1)
Sodium: 140 mEq/L (ref 135–145)
Total Bilirubin: 0.8 mg/dL (ref 0.2–1.2)
Total Protein: 6.3 g/dL (ref 6.0–8.3)

## 2020-05-30 LAB — URINALYSIS
Bilirubin Urine: NEGATIVE
Hgb urine dipstick: NEGATIVE
Ketones, ur: NEGATIVE
Leukocytes,Ua: NEGATIVE
Nitrite: NEGATIVE
Specific Gravity, Urine: 1.025 (ref 1.000–1.030)
Total Protein, Urine: NEGATIVE
Urine Glucose: NEGATIVE
Urobilinogen, UA: 0.2 (ref 0.0–1.0)
pH: 6 (ref 5.0–8.0)

## 2020-05-30 LAB — CBC WITH DIFFERENTIAL/PLATELET
Basophils Absolute: 0.1 10*3/uL (ref 0.0–0.1)
Basophils Relative: 1.2 % (ref 0.0–3.0)
Eosinophils Absolute: 0.2 10*3/uL (ref 0.0–0.7)
Eosinophils Relative: 2.8 % (ref 0.0–5.0)
HCT: 45.5 % (ref 39.0–52.0)
Hemoglobin: 15.7 g/dL (ref 13.0–17.0)
Lymphocytes Relative: 19.8 % (ref 12.0–46.0)
Lymphs Abs: 1.4 10*3/uL (ref 0.7–4.0)
MCHC: 34.4 g/dL (ref 30.0–36.0)
MCV: 90.6 fl (ref 78.0–100.0)
Monocytes Absolute: 0.8 10*3/uL (ref 0.1–1.0)
Monocytes Relative: 11 % (ref 3.0–12.0)
Neutro Abs: 4.5 10*3/uL (ref 1.4–7.7)
Neutrophils Relative %: 65.2 % (ref 43.0–77.0)
Platelets: 267 10*3/uL (ref 150.0–400.0)
RBC: 5.02 Mil/uL (ref 4.22–5.81)
RDW: 12.9 % (ref 11.5–15.5)
WBC: 6.9 10*3/uL (ref 4.0–10.5)

## 2020-05-30 LAB — LIPID PANEL
Cholesterol: 186 mg/dL (ref 0–200)
HDL: 51.3 mg/dL (ref >39.00)
NonHDL: 134.71
Total CHOL/HDL Ratio: 4
Triglycerides: 209 mg/dL — ABNORMAL HIGH (ref 0.0–149.0)
VLDL: 41.8 mg/dL — ABNORMAL HIGH (ref 0.0–40.0)

## 2020-05-30 LAB — TSH: TSH: 3.97 u[IU]/mL (ref 0.35–4.50)

## 2020-05-30 LAB — LDL CHOLESTEROL, DIRECT: Direct LDL: 96 mg/dL

## 2020-05-30 LAB — PSA: PSA: 0.32 ng/mL (ref 0.10–4.00)

## 2020-05-30 MED ORDER — CICLOPIROX 8 % EX SOLN: Freq: Every day | CUTANEOUS | 1 refills | Status: DC

## 2020-05-30 NOTE — Assessment & Plan Note (Signed)
Lions mane 

## 2020-05-30 NOTE — Progress Notes (Signed)
Subjective:  Patient ID: Paul Baron., male    DOB: 14-Mar-1945  Age: 75 y.o. MRN: [id number]  CC: Follow-up (3 month f/u)   HPI Paul Baron. presents for dyslipidemia, fam h/o dementia, DVT/PE F/u LBP.  Outpatient Medications Prior to Visit  Medication Sig Dispense Refill  . acetaminophen-codeine (TYLENOL #4) 300-60 MG tablet TAKE 1 TABLET BY MOUTH EVERY 12 HOURS AS NEEDED FOR PAIN 60 tablet 3  . atorvastatin (LIPITOR) 10 MG tablet Take 1 tablet by mouth daily. 90 tablet 3  . buPROPion (WELLBUTRIN XL) 300 MG 24 hr tablet Take 1 tablet by mouth daily. 90 tablet 1  . fenofibrate (TRICOR) 145 MG tablet TAKE 1 TABLET BY MOUTH EVERY DAY 30 tablet 3  . Multiple Vitamins-Minerals (MENS MULTIVITAMIN) TABS Take by mouth. Take 1 tablet by mouth every day    . pantoprazole (PROTONIX) 40 MG tablet Take 1 tablet (40 mg total) by mouth daily. 90 tablet 3  . sildenafil (VIAGRA) 100 MG tablet TAKE 1 TABLET(100 MG) BY MOUTH DAILY AS NEEDED FOR ERECTILE DYSFUNCTION 12 tablet 2  . triamterene-hydrochlorothiazide (MAXZIDE-25) 37.5-25 MG tablet Take 1 tablet by mouth daily. Annual appt due in March must see provider for future refills 90 tablet 0  . zolpidem (AMBIEN) 10 MG tablet TAKE 1 TABLET(10 MG) BY MOUTH AT BEDTIME AS NEEDED FOR SLEEP 30 tablet 0  . finasteride (PROSCAR) 5 MG tablet Take 1 tablet (5 mg total) by mouth daily. Annual appt due in March must see provider for future refills (Patient not taking: Reported on 05/30/2020) 90 tablet 0   No facility-administered medications prior to visit.    ROS: Review of Systems  Constitutional: Negative for appetite change, fatigue and unexpected weight change.  HENT: Negative for congestion, nosebleeds, sneezing, sore throat and trouble swallowing.   Eyes: Negative for itching and visual disturbance.  Respiratory: Negative for cough.   Cardiovascular: Negative for chest pain, palpitations and leg swelling.  Gastrointestinal: Negative for  abdominal distention, blood in stool, diarrhea and nausea.  Genitourinary: Negative for frequency and hematuria.  Musculoskeletal: Positive for arthralgias and back pain. Negative for gait problem, joint swelling and neck pain.  Skin: Negative for rash.  Neurological: Negative for dizziness, tremors, speech difficulty and weakness.  Psychiatric/Behavioral: Negative for agitation, dysphoric mood, sleep disturbance and suicidal ideas. The patient is not nervous/anxious.     Objective:  BP (!) 142/80 (BP Location: Left Arm)   Pulse 64   Temp 98 F (36.7 C) (Oral)   Ht 6' (1.829 m)   Wt 196 lb 13.6 oz (89.3 kg)   SpO2 98%   BMI 26.70 kg/m   BP Readings from Last 3 Encounters:  05/30/20 (!) 142/80  03/03/20 112/68  02/23/20 138/82    Wt Readings from Last 3 Encounters:  05/30/20 196 lb 13.6 oz (89.3 kg)  02/23/20 201 lb 3.2 oz (91.3 kg)  02/18/20 200 lb 9.6 oz (91 kg)    Physical Exam Constitutional:      General: He is not in acute distress.    Appearance: He is well-developed.     Comments: NAD  Eyes:     Conjunctiva/sclera: Conjunctivae normal.     Pupils: Pupils are equal, round, and reactive to light.  Neck:     Thyroid: No thyromegaly.     Vascular: No JVD.  Cardiovascular:     Rate and Rhythm: Normal rate and regular rhythm.     Heart sounds: Normal heart sounds. No murmur  heard. No friction rub. No gallop.   Pulmonary:     Effort: Pulmonary effort is normal. No respiratory distress.     Breath sounds: Normal breath sounds. No wheezing or rales.  Chest:     Chest wall: No tenderness.  Abdominal:     General: Bowel sounds are normal. There is no distension.     Palpations: Abdomen is soft. There is no mass.     Tenderness: There is no abdominal tenderness. There is no guarding or rebound.  Musculoskeletal:        General: No tenderness. Normal range of motion.     Cervical back: Normal range of motion.  Lymphadenopathy:     Cervical: No cervical adenopathy.   Skin:    General: Skin is warm and dry.     Findings: No rash.  Neurological:     Mental Status: He is alert and oriented to person, place, and time.     Cranial Nerves: No cranial nerve deficit.     Motor: No abnormal muscle tone.     Coordination: Coordination normal.     Gait: Gait normal.     Deep Tendon Reflexes: Reflexes are normal and symmetric.  Psychiatric:        Behavior: Behavior normal.        Thought Content: Thought content normal.        Judgment: Judgment normal.     Lab Results  Component Value Date   WBC 7.2 05/20/2019   HGB 16.0 05/20/2019   HCT 47.3 05/20/2019   PLT 269.0 05/20/2019   GLUCOSE 115 (H) 05/20/2019   CHOL 196 05/20/2019   TRIG (H) 05/20/2019    478.0 Triglyceride is over 400; calculations on Lipids are invalid.   HDL 42.20 05/20/2019   LDLDIRECT 87.0 05/20/2019   LDLCALC 98 01/04/2014   ALT 19 05/20/2019   AST 31 05/20/2019   NA 138 05/20/2019   K 3.8 05/20/2019   CL 104 05/20/2019   CREATININE 0.91 05/20/2019   BUN 25 (H) 05/20/2019   CO2 26 05/20/2019   TSH 3.03 05/20/2019   PSA 0.15 05/20/2019    ECHOCARDIOGRAM COMPLETE  Result Date: 03/09/2019   ECHOCARDIOGRAM REPORT   Patient Name:   Paul [name]. Date of Exam: 03/09/2019 Medical Rec #:  [id number]            Height:       72.0 in Accession #:    [id number]           Weight:       198.0 lb Date of Birth:  [birth year]            BSA:          2.12 m Patient Age:    67 years             BP:           126/70 mmHg Patient Gender: M                    HR:           65 bpm. Exam Location:  [facility] Procedure: 2D Echo, Cardiac Doppler and Color Doppler Indications:    R55 Syncope  History:        Patient has no prior history of Echocardiogram examinations.                 Risk Factors:Hypertension and Dyslipidemia.  Sonographer:    NaTashia Rodgers-Jones RDCS  Referring Phys: Taylor  1. Left ventricular ejection fraction, by visual estimation, is 65  to 70%. The left ventricle has hyperdynamic function. There is no left ventricular hypertrophy.  2. Left ventricular diastolic parameters are consistent with Grade I diastolic dysfunction (impaired relaxation).  3. The left ventricle has no regional wall motion abnormalities.  4. Global right ventricle has normal systolic function.The right ventricular size is normal. No increase in right ventricular wall thickness.  5. Left atrial size was normal.  6. Right atrial size was normal.  7. The mitral valve is normal in structure. Mild mitral valve regurgitation. No evidence of mitral stenosis.  8. The tricuspid valve is normal in structure.  9. The aortic valve is normal in structure. Aortic valve regurgitation is not visualized. No evidence of aortic valve sclerosis or stenosis. 10. The pulmonic valve was normal in structure. Pulmonic valve regurgitation is trivial. 11. Normal pulmonary artery systolic pressure. 12. The tricuspid regurgitant velocity is 2.26 m/s, and with an assumed right atrial pressure of 3 mmHg, the estimated right ventricular systolic pressure is normal at 23.4 mmHg. 13. The inferior vena cava is normal in size with greater than 50% respiratory variability, suggesting right atrial pressure of 3 mmHg. FINDINGS  Left Ventricle: Left ventricular ejection fraction, by visual estimation, is 65 to 70%. The left ventricle has hyperdynamic function. The left ventricle has no regional wall motion abnormalities. There is no left ventricular hypertrophy. Left ventricular diastolic parameters are consistent with Grade I diastolic dysfunction (impaired relaxation). Normal left atrial pressure. Right Ventricle: The right ventricular size is normal. No increase in right ventricular wall thickness. Global RV systolic function is has normal systolic function. The tricuspid regurgitant velocity is 2.26 m/s, and with an assumed right atrial pressure  of 3 mmHg, the estimated right ventricular systolic pressure is  normal at 23.4 mmHg. Left Atrium: Left atrial size was normal in size. Right Atrium: Right atrial size was normal in size Pericardium: There is no evidence of pericardial effusion. Mitral Valve: The mitral valve is normal in structure. Mild mitral valve regurgitation. No evidence of mitral valve stenosis by observation. Tricuspid Valve: The tricuspid valve is normal in structure. Tricuspid valve regurgitation is mild. Aortic Valve: The aortic valve is normal in structure. Aortic valve regurgitation is not visualized. The aortic valve is structurally normal, with no evidence of sclerosis or stenosis. Pulmonic Valve: The pulmonic valve was normal in structure. Pulmonic valve regurgitation is trivial. Pulmonic regurgitation is trivial. Aorta: The aortic root, ascending aorta and aortic arch are all structurally normal, with no evidence of dilitation or obstruction. Venous: The inferior vena cava is normal in size with greater than 50% respiratory variability, suggesting right atrial pressure of 3 mmHg. IAS/Shunts: No atrial level shunt detected by color flow Doppler. There is no evidence of a patent foramen ovale. No ventricular septal defect is seen or detected. There is no evidence of an atrial septal defect.  LEFT VENTRICLE PLAX 2D LVIDd:         4.10 cm  Diastology LVIDs:         2.60 cm  LV e' lateral:   8.59 cm/s LV PW:         0.90 cm  LV E/e' lateral: 8.2 LV IVS:        1.10 cm  LV e' medial:    7.72 cm/s LVOT diam:     2.20 cm  LV E/e' medial:  9.1 LV SV:  50 ml LV SV Index:   23.09 LVOT Area:     3.80 cm  RIGHT VENTRICLE RV Basal diam:  3.70 cm RV S prime:     16.60 cm/s TAPSE (M-mode): 2.3 cm LEFT ATRIUM             Index       RIGHT ATRIUM           Index LA diam:        3.90 cm 1.84 cm/m  RA Area:     12.50 cm LA Vol (A2C):   57.9 ml 27.29 ml/m RA Volume:   29.20 ml  13.76 ml/m LA Vol (A4C):   32.3 ml 15.23 ml/m LA Biplane Vol: 46.6 ml 21.97 ml/m  AORTIC VALVE LVOT Vmax:   92.85 cm/s LVOT  Vmean:  66.600 cm/s LVOT VTI:    0.208 m  AORTA Ao Root diam: 4.20 cm Ao Asc diam:  3.90 cm MITRAL VALVE                        TRICUSPID VALVE MV Area (PHT): 4.21 cm             TR Peak grad:   20.4 mmHg MV PHT:        52.20 msec           TR Vmax:        226.00 cm/s MV Decel Time: 180 msec MV E velocity: 70.30 cm/s 103 cm/s  SHUNTS MV A velocity: 73.30 cm/s 70.3 cm/s Systemic VTI:  0.21 m MV E/A ratio:  0.96       1.5       Systemic Diam: 2.20 cm  Ena Dawley MD Electronically signed by Ena Dawley MD Signature Date/Time: 03/09/2019/4:18:25 PM    Final    VAS US CAROTID  Result Date: 03/09/2019 Carotid Arterial Duplex Study Indications:       Patient reports intermittent dizziness x 1 year and one                    presyncope episode occurring a couple of weeks ago. He                    relates his symptoms to blood pressure medication. He states                    since his prescription has be halved he has not experienced                    any of the symptoms. He denies any other cerebrovascular                    symptoms. Risk Factors:      Hypertension, hyperlipidemia, past history of smoking. Comparison Study:  NA Performing Technologist: Sharlett Iles RVT  Examination Guidelines: A complete evaluation includes B-mode imaging, spectral Doppler, color Doppler, and power Doppler as needed of all accessible portions of each vessel. Bilateral testing is considered an integral part of a complete examination. Limited examinations for reoccurring indications may be performed as noted.  Right Carotid Findings: +----------+--------+--------+--------+------------------+--------+           PSV cm/sEDV cm/sStenosisPlaque DescriptionComments +----------+--------+--------+--------+------------------+--------+ CCA Prox  126     14                                         +----------+--------+--------+--------+------------------+--------+  CCA Distal94      11                                          +----------+--------+--------+--------+------------------+--------+ ICA Prox  84      15                                         +----------+--------+--------+--------+------------------+--------+ ICA Mid   74      17                                         +----------+--------+--------+--------+------------------+--------+ ICA Distal79      21                                         +----------+--------+--------+--------+------------------+--------+ ECA       132     9                                          +----------+--------+--------+--------+------------------+--------+ +----------+--------+-------+----------------+-------------------+           PSV cm/sEDV cmsDescribe        Arm Pressure (mmHG) +----------+--------+-------+----------------+-------------------+ [id number]            Multiphasic, [id number]                 +----------+--------+-------+----------------+-------------------+ +---------+--------+--+--------+-+---------+ VertebralPSV cm/s36EDV cm/s7Antegrade +---------+--------+--+--------+-+---------+  Left Carotid Findings: +----------+--------+--------+--------+------------------+------------------+           PSV cm/sEDV cm/sStenosisPlaque DescriptionComments           +----------+--------+--------+--------+------------------+------------------+ CCA Prox  118     14                                                   +----------+--------+--------+--------+------------------+------------------+ CCA Distal88      16                                                   +----------+--------+--------+--------+------------------+------------------+ ICA Prox  81      18      1-39%   heterogenous      intimal thickening +----------+--------+--------+--------+------------------+------------------+ ICA Mid   68      22                                                    +----------+--------+--------+--------+------------------+------------------+ ICA Distal85      29                                                   +----------+--------+--------+--------+------------------+------------------+  ECA       90      9               heterogenous                         +----------+--------+--------+--------+------------------+------------------+ +----------+--------+--------+----------------+-------------------+           PSV cm/sEDV cm/sDescribe        Arm Pressure (mmHG) +----------+--------+--------+----------------+-------------------+ [id number]             Multiphasic, [id number]                 +----------+--------+--------+----------------+-------------------+ +---------+--------+--+--------+-+---------+ VertebralPSV cm/s34EDV cm/s7Antegrade +---------+--------+--+--------+-+---------+  Summary: Right Carotid: There was no evidence of thrombus, dissection, atherosclerotic                plaque or stenosis in the cervical carotid system. Left Carotid: Velocities in the left ICA are consistent with a 1-39% stenosis. Vertebrals:  Bilateral vertebral arteries demonstrate antegrade flow. Subclavians: Normal flow hemodynamics were seen in bilateral subclavian              arteries. *See table(s) above for measurements and observations.  Electronically signed by Jenkins Rouge MD on 03/09/2019 at 11:40:33 AM.    Final     Assessment & Plan:    Walker Kehr, MD

## 2020-05-30 NOTE — Assessment & Plan Note (Signed)
He will go to [employer]

## 2020-05-30 NOTE — Assessment & Plan Note (Signed)
Toenails B [place]

## 2020-05-30 NOTE — Assessment & Plan Note (Signed)
2019 Coronary calcium score of 8. On Lipitor

## 2020-05-30 NOTE — Assessment & Plan Note (Addendum)
Try Lion's mane Continue Wellbutrin XL

## 2020-05-30 NOTE — Assessment & Plan Note (Signed)
On [name], Maxzide 

## 2020-07-11 ENCOUNTER — Other Ambulatory Visit: Payer: Self-pay | Admitting: Internal Medicine

## 2020-07-11 NOTE — Telephone Encounter (Signed)
Requesting: Ambien Last Visit: 05/30/20 Next Visit: 09/05/20 Last Refill: 05/25/20 Please advise; PMP done

## 2020-07-21 ENCOUNTER — Other Ambulatory Visit: Payer: Self-pay | Admitting: Internal Medicine

## 2020-07-22 ENCOUNTER — Other Ambulatory Visit: Payer: Self-pay | Admitting: Internal Medicine

## 2020-07-22 MED ORDER — ACETAMINOPHEN-CODEINE 300-60 MG PO TABS: 1.0000 | ORAL_TABLET | Freq: Two times a day (BID) | ORAL | 3 refills | Status: DC | PRN

## 2020-08-03 ENCOUNTER — Other Ambulatory Visit: Payer: Self-pay | Admitting: Internal Medicine

## 2020-09-02 ENCOUNTER — Other Ambulatory Visit: Payer: Self-pay | Admitting: Internal Medicine

## 2020-09-05 ENCOUNTER — Encounter: Payer: Self-pay | Admitting: Internal Medicine

## 2020-09-05 ENCOUNTER — Other Ambulatory Visit: Payer: Self-pay

## 2020-09-05 ENCOUNTER — Ambulatory Visit: Payer: BC Managed Care – PPO | Admitting: Internal Medicine

## 2020-09-05 VITALS — BP 140/78 | HR 68 | Temp 97.8°F | Ht 72.0 in | Wt 197.8 lb

## 2020-09-05 DIAGNOSIS — E559 Vitamin D deficiency, unspecified: Secondary | ICD-10-CM | POA: Diagnosis not present

## 2020-09-05 DIAGNOSIS — F32A Depression, unspecified: Secondary | ICD-10-CM

## 2020-09-05 DIAGNOSIS — Z23 Encounter for immunization: Secondary | ICD-10-CM | POA: Diagnosis not present

## 2020-09-05 DIAGNOSIS — E291 Testicular hypofunction: Secondary | ICD-10-CM

## 2020-09-05 DIAGNOSIS — I10 Essential (primary) hypertension: Secondary | ICD-10-CM

## 2020-09-05 DIAGNOSIS — R5383 Other fatigue: Secondary | ICD-10-CM

## 2020-09-05 DIAGNOSIS — F419 Anxiety disorder, unspecified: Secondary | ICD-10-CM

## 2020-09-05 DIAGNOSIS — I2583 Coronary atherosclerosis due to lipid rich plaque: Secondary | ICD-10-CM

## 2020-09-05 DIAGNOSIS — E785 Hyperlipidemia, unspecified: Secondary | ICD-10-CM

## 2020-09-05 DIAGNOSIS — I2510 Atherosclerotic heart disease of native coronary artery without angina pectoris: Secondary | ICD-10-CM

## 2020-09-05 LAB — BASIC METABOLIC PANEL
BUN: 21 mg/dL (ref 6–23)
CO2: 27 mEq/L (ref 19–32)
Calcium: 9.2 mg/dL (ref 8.4–10.5)
Chloride: 102 mEq/L (ref 96–112)
Creatinine, Ser: 0.89 mg/dL (ref 0.40–1.50)
GFR: 83.48 mL/min (ref >60.00)
Glucose, Bld: 90 mg/dL (ref 70–99)
Potassium: 4.2 mEq/L (ref 3.5–5.1)
Sodium: 137 mEq/L (ref 135–145)

## 2020-09-05 LAB — TSH: TSH: 2.73 u[IU]/mL (ref 0.35–4.50)

## 2020-09-05 LAB — TESTOSTERONE: Testosterone: 247.97 ng/dL — ABNORMAL LOW (ref 300.00–890.00)

## 2020-09-05 LAB — VITAMIN D 25 HYDROXY (VIT D DEFICIENCY, FRACTURES): VITD: 50.99 ng/mL (ref 30.00–100.00)

## 2020-09-05 NOTE — Assessment & Plan Note (Signed)
Cont on [name], Maxzide

## 2020-09-05 NOTE — Progress Notes (Signed)
Subjective:  Patient ID: Paul Baron., male    DOB: 02-21-1946  Age: 75 y.o. MRN: [id number]  CC: Follow-up (3 month f/u)   HPI Paul Baron. presents for HTN, LBP, anxiety f/u  Outpatient Medications Prior to Visit  Medication Sig Dispense Refill   acetaminophen-codeine (TYLENOL #4) 300-60 MG tablet Take 1 tablet by mouth every 12 (twelve) hours as needed. for pain 60 tablet 3   atorvastatin (LIPITOR) 10 MG tablet Take 1 tablet by mouth daily. 90 tablet 3   buPROPion (WELLBUTRIN XL) 300 MG 24 hr tablet Take 1 tablet by mouth daily. 90 tablet 2   ciclopirox (PENLAC) 8 % solution Apply topically at bedtime. Apply over nail and surrounding skin daily 6.6 mL 1   fenofibrate (TRICOR) 145 MG tablet TAKE 1 TABLET BY MOUTH EVERY DAY 30 tablet 3   Multiple Vitamins-Minerals (MENS MULTIVITAMIN) TABS Take by mouth. Take 1 tablet by mouth every day     pantoprazole (PROTONIX) 40 MG tablet Take 1 tablet (40 mg total) by mouth daily. 90 tablet 3   sildenafil (VIAGRA) 100 MG tablet TAKE 1 TABLET(100 MG) BY MOUTH DAILY AS NEEDED FOR ERECTILE DYSFUNCTION 12 tablet 2   triamterene-hydrochlorothiazide (MAXZIDE-25) 37.5-25 MG tablet Take 1 tablet by mouth daily. (Patient taking differently: Take 0.5 tablets by mouth daily. Pt takes 1/2 tablet a day) 90 tablet 2   zolpidem (AMBIEN) 10 MG tablet TAKE 1 TABLET(10 MG) BY MOUTH AT BEDTIME AS NEEDED FOR SLEEP 30 tablet 3   finasteride (PROSCAR) 5 MG tablet Take 1 tablet (5 mg total) by mouth daily. (Patient not taking: Reported on 09/05/2020) 90 tablet 2   No facility-administered medications prior to visit.    ROS: Review of Systems  Constitutional:  Negative for appetite change, fatigue and unexpected weight change.  HENT:  Negative for congestion, nosebleeds, sneezing, sore throat and trouble swallowing.   Eyes:  Negative for itching and visual disturbance.  Respiratory:  Negative for cough.   Cardiovascular:  Negative for chest pain,  palpitations and leg swelling.  Gastrointestinal:  Negative for abdominal distention, blood in stool, diarrhea and nausea.  Genitourinary:  Negative for frequency and hematuria.  Musculoskeletal:  Positive for back pain. Negative for gait problem, joint swelling and neck pain.  Skin:  Negative for rash.  Neurological:  Negative for dizziness, tremors, speech difficulty and weakness.  Psychiatric/Behavioral:  Negative for agitation, dysphoric mood, sleep disturbance and suicidal ideas. The patient is nervous/anxious.    Objective:  BP 140/78 (BP Location: Left Arm)   Pulse 68   Temp 97.8 F (36.6 C) (Oral)   Ht 6' (1.829 m)   Wt 197 lb 12.8 oz (89.7 kg)   SpO2 96%   BMI 26.83 kg/m   BP Readings from Last 3 Encounters:  09/05/20 140/78  05/30/20 (!) 142/80  03/03/20 112/68    Wt Readings from Last 3 Encounters:  09/05/20 197 lb 12.8 oz (89.7 kg)  05/30/20 196 lb 13.6 oz (89.3 kg)  02/23/20 201 lb 3.2 oz (91.3 kg)    Physical Exam Constitutional:      General: He is not in acute distress.    Appearance: Normal appearance. He is well-developed.     Comments: NAD  Eyes:     Conjunctiva/sclera: Conjunctivae normal.     Pupils: Pupils are equal, round, and reactive to light.  Neck:     Thyroid: No thyromegaly.     Vascular: No JVD.  Cardiovascular:  Rate and Rhythm: Normal rate and regular rhythm.     Heart sounds: Normal heart sounds. No murmur heard.   No friction rub. No gallop.  Pulmonary:     Effort: Pulmonary effort is normal. No respiratory distress.     Breath sounds: Normal breath sounds. No wheezing or rales.  Chest:     Chest wall: No tenderness.  Abdominal:     General: Bowel sounds are normal. There is no distension.     Palpations: Abdomen is soft. There is no mass.     Tenderness: There is no abdominal tenderness. There is no guarding or rebound.  Musculoskeletal:        General: Tenderness present. Normal range of motion.     Cervical back: Normal  range of motion.  Lymphadenopathy:     Cervical: No cervical adenopathy.  Skin:    General: Skin is warm and dry.     Findings: No rash.  Neurological:     Mental Status: He is alert and oriented to person, place, and time.     Cranial Nerves: No cranial nerve deficit.     Motor: No abnormal muscle tone.     Coordination: Coordination normal.     Gait: Gait normal.     Deep Tendon Reflexes: Reflexes are normal and symmetric.  Psychiatric:        Behavior: Behavior normal.        Thought Content: Thought content normal.        Judgment: Judgment normal.    Lab Results  Component Value Date   WBC 6.9 05/30/2020   HGB 15.7 05/30/2020   HCT 45.5 05/30/2020   PLT 267.0 05/30/2020   GLUCOSE 109 (H) 05/30/2020   CHOL 186 05/30/2020   TRIG 209.0 (H) 05/30/2020   HDL 51.30 05/30/2020   LDLDIRECT 96.0 05/30/2020   LDLCALC 98 01/04/2014   ALT 22 05/30/2020   AST 37 05/30/2020   NA 140 05/30/2020   K 4.2 05/30/2020   CL 102 05/30/2020   CREATININE 1.00 05/30/2020   BUN 21 05/30/2020   CO2 29 05/30/2020   TSH 3.97 05/30/2020   PSA 0.32 05/30/2020    ECHOCARDIOGRAM COMPLETE  Result Date: 03/09/2019   ECHOCARDIOGRAM REPORT   Patient Name:   Paul [name]. Date of Exam: 03/09/2019 Medical Rec #:  [id number]            Height:       72.0 in Accession #:    [id number]           Weight:       198.0 lb Date of Birth:  20-Apr-1945            BSA:          2.12 m Patient Age:    57 years             BP:           126/70 mmHg Patient Gender: M                    HR:           65 bpm. Exam Location:  [facility] Procedure: 2D Echo, Cardiac Doppler and Color Doppler Indications:    R55 Syncope  History:        Patient has no prior history of Echocardiogram examinations.                 Risk Factors:Hypertension and Dyslipidemia.  Sonographer:  NaTashia Rodgers-Jones RDCS Referring Phys: [place]  1. Left ventricular ejection fraction, by visual estimation, is  65 to 70%. The left ventricle has hyperdynamic function. There is no left ventricular hypertrophy.  2. Left ventricular diastolic parameters are consistent with Grade I diastolic dysfunction (impaired relaxation).  3. The left ventricle has no regional wall motion abnormalities.  4. Global right ventricle has normal systolic function.The right ventricular size is normal. No increase in right ventricular wall thickness.  5. Left atrial size was normal.  6. Right atrial size was normal.  7. The mitral valve is normal in structure. Mild mitral valve regurgitation. No evidence of mitral stenosis.  8. The tricuspid valve is normal in structure.  9. The aortic valve is normal in structure. Aortic valve regurgitation is not visualized. No evidence of aortic valve sclerosis or stenosis. 10. The pulmonic valve was normal in structure. Pulmonic valve regurgitation is trivial. 11. Normal pulmonary artery systolic pressure. 12. The tricuspid regurgitant velocity is 2.26 m/s, and with an assumed right atrial pressure of 3 mmHg, the estimated right ventricular systolic pressure is normal at 23.4 mmHg. 13. The inferior vena cava is normal in size with greater than 50% respiratory variability, suggesting right atrial pressure of 3 mmHg. FINDINGS  Left Ventricle: Left ventricular ejection fraction, by visual estimation, is 65 to 70%. The left ventricle has hyperdynamic function. The left ventricle has no regional wall motion abnormalities. There is no left ventricular hypertrophy. Left ventricular diastolic parameters are consistent with Grade I diastolic dysfunction (impaired relaxation). Normal left atrial pressure. Right Ventricle: The right ventricular size is normal. No increase in right ventricular wall thickness. Global RV systolic function is has normal systolic function. The tricuspid regurgitant velocity is 2.26 m/s, and with an assumed right atrial pressure  of 3 mmHg, the estimated right ventricular systolic pressure is  normal at 23.4 mmHg. Left Atrium: Left atrial size was normal in size. Right Atrium: Right atrial size was normal in size Pericardium: There is no evidence of pericardial effusion. Mitral Valve: The mitral valve is normal in structure. Mild mitral valve regurgitation. No evidence of mitral valve stenosis by observation. Tricuspid Valve: The tricuspid valve is normal in structure. Tricuspid valve regurgitation is mild. Aortic Valve: The aortic valve is normal in structure. Aortic valve regurgitation is not visualized. The aortic valve is structurally normal, with no evidence of sclerosis or stenosis. Pulmonic Valve: The pulmonic valve was normal in structure. Pulmonic valve regurgitation is trivial. Pulmonic regurgitation is trivial. Aorta: The aortic root, ascending aorta and aortic arch are all structurally normal, with no evidence of dilitation or obstruction. Venous: The inferior vena cava is normal in size with greater than 50% respiratory variability, suggesting right atrial pressure of 3 mmHg. IAS/Shunts: No atrial level shunt detected by color flow Doppler. There is no evidence of a patent foramen ovale. No ventricular septal defect is seen or detected. There is no evidence of an atrial septal defect.  LEFT VENTRICLE PLAX 2D LVIDd:         4.10 cm  Diastology LVIDs:         2.60 cm  LV e' lateral:   8.59 cm/s LV PW:         0.90 cm  LV E/e' lateral: 8.2 LV IVS:        1.10 cm  LV e' medial:    7.72 cm/s LVOT diam:     2.20 cm  LV E/e' medial:  9.1 LV SV:  50 ml LV SV Index:   23.09 LVOT Area:     3.80 cm  RIGHT VENTRICLE RV Basal diam:  3.70 cm RV S prime:     16.60 cm/s TAPSE (M-mode): 2.3 cm LEFT ATRIUM             Index       RIGHT ATRIUM           Index LA diam:        3.90 cm 1.84 cm/m  RA Area:     12.50 cm LA Vol (A2C):   57.9 ml 27.29 ml/m RA Volume:   29.20 ml  13.76 ml/m LA Vol (A4C):   32.3 ml 15.23 ml/m LA Biplane Vol: 46.6 ml 21.97 ml/m  AORTIC VALVE LVOT Vmax:   92.85 cm/s LVOT  Vmean:  66.600 cm/s LVOT VTI:    0.208 m  AORTA Ao Root diam: 4.20 cm Ao Asc diam:  3.90 cm MITRAL VALVE                        TRICUSPID VALVE MV Area (PHT): 4.21 cm             TR Peak grad:   20.4 mmHg MV PHT:        52.20 msec           TR Vmax:        226.00 cm/s MV Decel Time: 180 msec MV E velocity: 70.30 cm/s 103 cm/s  SHUNTS MV A velocity: 73.30 cm/s 70.3 cm/s Systemic VTI:  0.21 m MV E/A ratio:  0.96       1.5       Systemic Diam: 2.20 cm  Ena Dawley MD Electronically signed by Ena Dawley MD Signature Date/Time: 03/09/2019/4:18:25 PM    Final    VAS US CAROTID  Result Date: 03/09/2019 Carotid Arterial Duplex Study Indications:       Patient reports intermittent dizziness x 1 year and one                    presyncope episode occurring a couple of weeks ago. He                    relates his symptoms to blood pressure medication. He states                    since his prescription has be halved he has not experienced                    any of the symptoms. He denies any other cerebrovascular                    symptoms. Risk Factors:      Hypertension, hyperlipidemia, past history of smoking. Comparison Study:  NA Performing Technologist: Sharlett Iles RVT  Examination Guidelines: A complete evaluation includes B-mode imaging, spectral Doppler, color Doppler, and power Doppler as needed of all accessible portions of each vessel. Bilateral testing is considered an integral part of a complete examination. Limited examinations for reoccurring indications may be performed as noted.  Right Carotid Findings: +----------+--------+--------+--------+------------------+--------+           PSV cm/sEDV cm/sStenosisPlaque DescriptionComments +----------+--------+--------+--------+------------------+--------+ CCA Prox  126     14                                         +----------+--------+--------+--------+------------------+--------+  CCA Distal94      11                                          +----------+--------+--------+--------+------------------+--------+ ICA Prox  84      15                                         +----------+--------+--------+--------+------------------+--------+ ICA Mid   74      17                                         +----------+--------+--------+--------+------------------+--------+ ICA Distal79      21                                         +----------+--------+--------+--------+------------------+--------+ ECA       132     9                                          +----------+--------+--------+--------+------------------+--------+ +----------+--------+-------+----------------+-------------------+           PSV cm/sEDV cmsDescribe        Arm Pressure (mmHG) +----------+--------+-------+----------------+-------------------+ [id number]            Multiphasic, [id number]                 +----------+--------+-------+----------------+-------------------+ +---------+--------+--+--------+-+---------+ VertebralPSV cm/s36EDV cm/s7Antegrade +---------+--------+--+--------+-+---------+  Left Carotid Findings: +----------+--------+--------+--------+------------------+------------------+           PSV cm/sEDV cm/sStenosisPlaque DescriptionComments           +----------+--------+--------+--------+------------------+------------------+ CCA Prox  118     14                                                   +----------+--------+--------+--------+------------------+------------------+ CCA Distal88      16                                                   +----------+--------+--------+--------+------------------+------------------+ ICA Prox  81      18      1-39%   heterogenous      intimal thickening +----------+--------+--------+--------+------------------+------------------+ ICA Mid   68      22                                                    +----------+--------+--------+--------+------------------+------------------+ ICA Distal85      29                                                   +----------+--------+--------+--------+------------------+------------------+  ECA       90      9               heterogenous                         +----------+--------+--------+--------+------------------+------------------+ +----------+--------+--------+----------------+-------------------+           PSV cm/sEDV cm/sDescribe        Arm Pressure (mmHG) +----------+--------+--------+----------------+-------------------+ [id number]             Multiphasic, [id number]                 +----------+--------+--------+----------------+-------------------+ +---------+--------+--+--------+-+---------+ VertebralPSV cm/s34EDV cm/s7Antegrade +---------+--------+--+--------+-+---------+  Summary: Right Carotid: There was no evidence of thrombus, dissection, atherosclerotic                plaque or stenosis in the cervical carotid system. Left Carotid: Velocities in the left ICA are consistent with a 1-39% stenosis. Vertebrals:  Bilateral vertebral arteries demonstrate antegrade flow. Subclavians: Normal flow hemodynamics were seen in bilateral subclavian              arteries. *See table(s) above for measurements and observations.  Electronically signed by Jenkins Rouge MD on 03/09/2019 at 11:40:33 AM.    Final     Assessment & Plan:    Walker Kehr, MD

## 2020-09-05 NOTE — Assessment & Plan Note (Signed)
On Wellbutrin XL 

## 2020-09-05 NOTE — Assessment & Plan Note (Signed)
On Lipitor 

## 2020-09-06 DIAGNOSIS — E291 Testicular hypofunction: Secondary | ICD-10-CM | POA: Insufficient documentation

## 2020-09-06 NOTE — Assessment & Plan Note (Signed)
Mild, asymptomatic See lab comments

## 2020-10-17 DIAGNOSIS — L578 Other skin changes due to chronic exposure to nonionizing radiation: Secondary | ICD-10-CM | POA: Diagnosis not present

## 2020-10-17 DIAGNOSIS — L570 Actinic keratosis: Secondary | ICD-10-CM | POA: Diagnosis not present

## 2020-10-17 DIAGNOSIS — C44320 Squamous cell carcinoma of skin of unspecified parts of face: Secondary | ICD-10-CM | POA: Diagnosis not present

## 2020-10-17 DIAGNOSIS — D225 Melanocytic nevi of trunk: Secondary | ICD-10-CM | POA: Diagnosis not present

## 2020-10-17 DIAGNOSIS — D692 Other nonthrombocytopenic purpura: Secondary | ICD-10-CM | POA: Diagnosis not present

## 2020-11-10 ENCOUNTER — Ambulatory Visit: Payer: BC Managed Care – PPO | Admitting: Internal Medicine

## 2020-11-10 ENCOUNTER — Encounter: Payer: Self-pay | Admitting: Internal Medicine

## 2020-11-10 ENCOUNTER — Other Ambulatory Visit: Payer: Self-pay

## 2020-11-10 DIAGNOSIS — M5441 Lumbago with sciatica, right side: Secondary | ICD-10-CM

## 2020-11-10 DIAGNOSIS — I2583 Coronary atherosclerosis due to lipid rich plaque: Secondary | ICD-10-CM

## 2020-11-10 DIAGNOSIS — I2510 Atherosclerotic heart disease of native coronary artery without angina pectoris: Secondary | ICD-10-CM | POA: Diagnosis not present

## 2020-11-10 DIAGNOSIS — F32A Depression, unspecified: Secondary | ICD-10-CM

## 2020-11-10 DIAGNOSIS — G8929 Other chronic pain: Secondary | ICD-10-CM

## 2020-11-10 DIAGNOSIS — E291 Testicular hypofunction: Secondary | ICD-10-CM

## 2020-11-10 DIAGNOSIS — M5442 Lumbago with sciatica, left side: Secondary | ICD-10-CM

## 2020-11-10 LAB — CBC WITH DIFFERENTIAL/PLATELET
Basophils Absolute: 0.1 10*3/uL (ref 0.0–0.1)
Basophils Relative: 0.7 % (ref 0.0–3.0)
Eosinophils Absolute: 0.2 10*3/uL (ref 0.0–0.7)
Eosinophils Relative: 3 % (ref 0.0–5.0)
HCT: 46.4 % (ref 39.0–52.0)
Hemoglobin: 15.3 g/dL (ref 13.0–17.0)
Lymphocytes Relative: 22.5 % (ref 12.0–46.0)
Lymphs Abs: 1.7 10*3/uL (ref 0.7–4.0)
MCHC: 33 g/dL (ref 30.0–36.0)
MCV: 93 fl (ref 78.0–100.0)
Monocytes Absolute: 0.9 10*3/uL (ref 0.1–1.0)
Monocytes Relative: 12.7 % — ABNORMAL HIGH (ref 3.0–12.0)
Neutro Abs: 4.5 10*3/uL (ref 1.4–7.7)
Neutrophils Relative %: 61.1 % (ref 43.0–77.0)
Platelets: 260 10*3/uL (ref 150.0–400.0)
RBC: 4.99 Mil/uL (ref 4.22–5.81)
RDW: 12.8 % (ref 11.5–15.5)
WBC: 7.4 10*3/uL (ref 4.0–10.5)

## 2020-11-10 LAB — COMPREHENSIVE METABOLIC PANEL
ALT: 19 U/L (ref 0–53)
AST: 30 U/L (ref 0–37)
Albumin: 4.3 g/dL (ref 3.5–5.2)
Alkaline Phosphatase: 56 U/L (ref 39–117)
BUN: 21 mg/dL (ref 6–23)
CO2: 30 mEq/L (ref 19–32)
Calcium: 9.5 mg/dL (ref 8.4–10.5)
Chloride: 101 mEq/L (ref 96–112)
Creatinine, Ser: 0.97 mg/dL (ref 0.40–1.50)
GFR: 75.95 mL/min (ref >60.00)
Glucose, Bld: 95 mg/dL (ref 70–99)
Potassium: 4.4 mEq/L (ref 3.5–5.1)
Sodium: 137 mEq/L (ref 135–145)
Total Bilirubin: 0.6 mg/dL (ref 0.2–1.2)
Total Protein: 6.5 g/dL (ref 6.0–8.3)

## 2020-11-10 LAB — TESTOSTERONE: Testosterone: 335.42 ng/dL (ref 300.00–890.00)

## 2020-11-10 LAB — PSA: PSA: 0.17 ng/mL (ref 0.10–4.00)

## 2020-11-10 MED ORDER — TESTOSTERONE CYPIONATE 200 MG/ML IM SOLN: 200.0000 mg | INTRAMUSCULAR | 5 refills | Status: DC

## 2020-11-10 MED ORDER — ZOLPIDEM TARTRATE 10 MG PO TABS: ORAL_TABLET | ORAL | 1 refills | Status: DC

## 2020-11-10 MED ORDER — ACETAMINOPHEN-CODEINE 300-60 MG PO TABS: 1.0000 | ORAL_TABLET | Freq: Two times a day (BID) | ORAL | 3 refills | Status: DC | PRN

## 2020-11-10 MED ORDER — BD ECLIPSE SYRINGE 25G X 1" 3 ML MISC
3 refills | Status: DC
Start: 2020-11-10 — End: 2021-06-23

## 2020-11-10 NOTE — Assessment & Plan Note (Signed)
Will try testosterone inj IM  Potential benefits of a long term sex steroid  use as well as potential risks  and complications were explained to the patient and were aknowledged.

## 2020-11-10 NOTE — Assessment & Plan Note (Signed)
On Wellbutrin XL 

## 2020-11-10 NOTE — Assessment & Plan Note (Signed)
On Lipitor 

## 2020-11-10 NOTE — Assessment & Plan Note (Signed)
Continue with T#4 prn  Potential benefits of a long term opioids use as well as potential risks (i.e. addiction risk, apnea etc) and complications (i.e. Somnolence, constipation and others) were explained to the patient and were aknowledged.

## 2020-11-10 NOTE — Addendum Note (Signed)
Addended by: Jacobo Forest on: 11/10/2020 10:19 AM   Modules accepted: Orders

## 2020-11-10 NOTE — Progress Notes (Signed)
Subjective:  Patient ID: Paul [name]., male    DOB: 19-Nov-1945  Age: 75 y.o. MRN: [id number]  CC: Follow-up (F/U on BP & Testostrone)   HPI Paul [name]. presents for dyslipidemia, depression, fatigue, low testosterone    Outpatient Medications Prior to Visit  Medication Sig Dispense Refill   acetaminophen-codeine (TYLENOL #4) 300-60 MG tablet Take 1 tablet by mouth every 12 (twelve) hours as needed. for pain 60 tablet 3   atorvastatin (LIPITOR) 10 MG tablet Take 1 tablet by mouth daily. 90 tablet 3   buPROPion (WELLBUTRIN XL) 300 MG 24 hr tablet Take 1 tablet by mouth daily. 90 tablet 2   ciclopirox (PENLAC) 8 % solution Apply topically at bedtime. Apply over nail and surrounding skin daily 6.6 mL 1   fenofibrate (TRICOR) 145 MG tablet TAKE 1 TABLET BY MOUTH EVERY DAY 30 tablet 3   Multiple Vitamins-Minerals (MENS MULTIVITAMIN) TABS Take by mouth. Take 1 tablet by mouth every day     pantoprazole (PROTONIX) 40 MG tablet Take 1 tablet (40 mg total) by mouth daily. 90 tablet 3   sildenafil (VIAGRA) 100 MG tablet TAKE 1 TABLET(100 MG) BY MOUTH DAILY AS NEEDED FOR ERECTILE DYSFUNCTION 12 tablet 2   triamterene-hydrochlorothiazide (MAXZIDE-25) 37.5-25 MG tablet Take 1 tablet by mouth daily. (Patient taking differently: Take 0.5 tablets by mouth daily. Pt takes 1/2 tablet a day) 90 tablet 2   zolpidem (AMBIEN) 10 MG tablet TAKE 1 TABLET(10 MG) BY MOUTH AT BEDTIME AS NEEDED FOR SLEEP 30 tablet 3   finasteride (PROSCAR) 5 MG tablet Take 5 mg by mouth daily. Take 1 by mouth daily     No facility-administered medications prior to visit.    ROS: Review of Systems  Constitutional:  Positive for fatigue. Negative for appetite change and unexpected weight change.  HENT:  Negative for congestion, nosebleeds, sneezing, sore throat and trouble swallowing.   Eyes:  Negative for itching and visual disturbance.  Respiratory:  Negative for cough.   Cardiovascular:  Negative for chest  pain, palpitations and leg swelling.  Gastrointestinal:  Negative for abdominal distention, blood in stool, diarrhea and nausea.  Genitourinary:  Negative for frequency and hematuria.  Musculoskeletal:  Positive for back pain. Negative for gait problem, joint swelling and neck pain.  Skin:  Negative for rash.  Neurological:  Negative for dizziness, tremors, speech difficulty and weakness.  Psychiatric/Behavioral:  Positive for dysphoric mood. Negative for agitation, sleep disturbance and suicidal ideas. The patient is not nervous/anxious.    Objective:  BP 132/62 (BP Location: Left Arm)   Pulse 66   Temp 97.9 F (36.6 C) (Oral)   Ht 6' (1.829 m)   Wt 202 lb 3.2 oz (91.7 kg)   SpO2 97%   BMI 27.42 kg/m   BP Readings from Last 3 Encounters:  11/10/20 132/62  09/05/20 140/78  05/30/20 (!) 142/80    Wt Readings from Last 3 Encounters:  11/10/20 202 lb 3.2 oz (91.7 kg)  09/05/20 197 lb 12.8 oz (89.7 kg)  05/30/20 196 lb 13.6 oz (89.3 kg)    Physical Exam Constitutional:      General: He is not in acute distress.    Appearance: He is well-developed.     Comments: NAD  Eyes:     Conjunctiva/sclera: Conjunctivae normal.     Pupils: Pupils are equal, round, and reactive to light.  Neck:     Thyroid: No thyromegaly.     Vascular: No JVD.  Cardiovascular:  Rate and Rhythm: Normal rate and regular rhythm.     Heart sounds: Normal heart sounds. No murmur heard.   No friction rub. No gallop.  Pulmonary:     Effort: Pulmonary effort is normal. No respiratory distress.     Breath sounds: Normal breath sounds. No wheezing or rales.  Chest:     Chest wall: No tenderness.  Abdominal:     General: Bowel sounds are normal. There is no distension.     Palpations: Abdomen is soft. There is no mass.     Tenderness: There is no abdominal tenderness. There is no guarding or rebound.  Musculoskeletal:        General: Tenderness present. Normal range of motion.     Cervical back:  Normal range of motion.  Lymphadenopathy:     Cervical: No cervical adenopathy.  Skin:    General: Skin is warm and dry.     Findings: No rash.  Neurological:     Mental Status: He is alert and oriented to person, place, and time.     Cranial Nerves: No cranial nerve deficit.     Motor: No abnormal muscle tone.     Coordination: Coordination normal.     Gait: Gait normal.     Deep Tendon Reflexes: Reflexes are normal and symmetric.  Psychiatric:        Behavior: Behavior normal.        Thought Content: Thought content normal.        Judgment: Judgment normal.  LS w/pain Small NT R inguinal heria  Lab Results  Component Value Date   WBC 6.9 05/30/2020   HGB 15.7 05/30/2020   HCT 45.5 05/30/2020   PLT 267.0 05/30/2020   GLUCOSE 90 09/05/2020   CHOL 186 05/30/2020   TRIG 209.0 (H) 05/30/2020   HDL 51.30 05/30/2020   LDLDIRECT 96.0 05/30/2020   LDLCALC 98 01/04/2014   ALT 22 05/30/2020   AST 37 05/30/2020   NA 137 09/05/2020   K 4.2 09/05/2020   CL 102 09/05/2020   CREATININE 0.89 09/05/2020   BUN 21 09/05/2020   CO2 27 09/05/2020   TSH 2.73 09/05/2020   PSA 0.32 05/30/2020    ECHOCARDIOGRAM COMPLETE  Result Date: 03/09/2019   ECHOCARDIOGRAM REPORT   Patient Name:   Chais Berger. Date of Exam: 03/09/2019 Medical Rec #:  [id number]            Height:       72.0 in Accession #:    [id number]           Weight:       198.0 lb Date of Birth:  Aug 16, 1945            BSA:          2.12 m Patient Age:    20 years             BP:           126/70 mmHg Patient Gender: M                    HR:           65 bpm. Exam Location:  [facility] Procedure: 2D Echo, Cardiac Doppler and Color Doppler Indications:    R55 Syncope  History:        Patient has no prior history of Echocardiogram examinations.                 Risk  Factors:Hypertension and Dyslipidemia.  Sonographer:    Wilford Sports Rodgers-Jones RDCS Referring Phys: [place]  1. Left ventricular  ejection fraction, by visual estimation, is 65 to 70%. The left ventricle has hyperdynamic function. There is no left ventricular hypertrophy.  2. Left ventricular diastolic parameters are consistent with Grade I diastolic dysfunction (impaired relaxation).  3. The left ventricle has no regional wall motion abnormalities.  4. Global right ventricle has normal systolic function.The right ventricular size is normal. No increase in right ventricular wall thickness.  5. Left atrial size was normal.  6. Right atrial size was normal.  7. The mitral valve is normal in structure. Mild mitral valve regurgitation. No evidence of mitral stenosis.  8. The tricuspid valve is normal in structure.  9. The aortic valve is normal in structure. Aortic valve regurgitation is not visualized. No evidence of aortic valve sclerosis or stenosis. 10. The pulmonic valve was normal in structure. Pulmonic valve regurgitation is trivial. 11. Normal pulmonary artery systolic pressure. 12. The tricuspid regurgitant velocity is 2.26 m/s, and with an assumed right atrial pressure of 3 mmHg, the estimated right ventricular systolic pressure is normal at 23.4 mmHg. 13. The inferior vena cava is normal in size with greater than 50% respiratory variability, suggesting right atrial pressure of 3 mmHg. FINDINGS  Left Ventricle: Left ventricular ejection fraction, by visual estimation, is 65 to 70%. The left ventricle has hyperdynamic function. The left ventricle has no regional wall motion abnormalities. There is no left ventricular hypertrophy. Left ventricular diastolic parameters are consistent with Grade I diastolic dysfunction (impaired relaxation). Normal left atrial pressure. Right Ventricle: The right ventricular size is normal. No increase in right ventricular wall thickness. Global RV systolic function is has normal systolic function. The tricuspid regurgitant velocity is 2.26 m/s, and with an assumed right atrial pressure  of 3 mmHg, the  estimated right ventricular systolic pressure is normal at 23.4 mmHg. Left Atrium: Left atrial size was normal in size. Right Atrium: Right atrial size was normal in size Pericardium: There is no evidence of pericardial effusion. Mitral Valve: The mitral valve is normal in structure. Mild mitral valve regurgitation. No evidence of mitral valve stenosis by observation. Tricuspid Valve: The tricuspid valve is normal in structure. Tricuspid valve regurgitation is mild. Aortic Valve: The aortic valve is normal in structure. Aortic valve regurgitation is not visualized. The aortic valve is structurally normal, with no evidence of sclerosis or stenosis. Pulmonic Valve: The pulmonic valve was normal in structure. Pulmonic valve regurgitation is trivial. Pulmonic regurgitation is trivial. Aorta: The aortic root, ascending aorta and aortic arch are all structurally normal, with no evidence of dilitation or obstruction. Venous: The inferior vena cava is normal in size with greater than 50% respiratory variability, suggesting right atrial pressure of 3 mmHg. IAS/Shunts: No atrial level shunt detected by color flow Doppler. There is no evidence of a patent foramen ovale. No ventricular septal defect is seen or detected. There is no evidence of an atrial septal defect.  LEFT VENTRICLE PLAX 2D LVIDd:         4.10 cm  Diastology LVIDs:         2.60 cm  LV e' lateral:   8.59 cm/s LV PW:         0.90 cm  LV E/e' lateral: 8.2 LV IVS:        1.10 cm  LV e' medial:    7.72 cm/s LVOT diam:     2.20 cm  LV E/e' medial:  9.1 LV SV:         50 ml LV SV Index:   23.09 LVOT Area:     3.80 cm  RIGHT VENTRICLE RV Basal diam:  3.70 cm RV S prime:     16.60 cm/s TAPSE (M-mode): 2.3 cm LEFT ATRIUM             Index       RIGHT ATRIUM           Index LA diam:        3.90 cm 1.84 cm/m  RA Area:     12.50 cm LA Vol (A2C):   57.9 ml 27.29 ml/m RA Volume:   29.20 ml  13.76 ml/m LA Vol (A4C):   32.3 ml 15.23 ml/m LA Biplane Vol: 46.6 ml 21.97  ml/m  AORTIC VALVE LVOT Vmax:   92.85 cm/s LVOT Vmean:  66.600 cm/s LVOT VTI:    0.208 m  AORTA Ao Root diam: 4.20 cm Ao Asc diam:  3.90 cm MITRAL VALVE                        TRICUSPID VALVE MV Area (PHT): 4.21 cm             TR Peak grad:   20.4 mmHg MV PHT:        52.20 msec           TR Vmax:        226.00 cm/s MV Decel Time: 180 msec MV E velocity: 70.30 cm/s 103 cm/s  SHUNTS MV A velocity: 73.30 cm/s 70.3 cm/s Systemic VTI:  0.21 m MV E/A ratio:  0.96       1.5       Systemic Diam: 2.20 cm  Ena Dawley MD Electronically signed by Ena Dawley MD Signature Date/Time: 03/09/2019/4:18:25 PM    Final    VAS US CAROTID  Result Date: 03/09/2019 Carotid Arterial Duplex Study Indications:       Patient reports intermittent dizziness x 1 year and one                    presyncope episode occurring a couple of weeks ago. He                    relates his symptoms to blood pressure medication. He states                    since his prescription has be halved he has not experienced                    any of the symptoms. He denies any other cerebrovascular                    symptoms. Risk Factors:      Hypertension, hyperlipidemia, past history of smoking. Comparison Study:  NA Performing Technologist: Sharlett Iles RVT  Examination Guidelines: A complete evaluation includes B-mode imaging, spectral Doppler, color Doppler, and power Doppler as needed of all accessible portions of each vessel. Bilateral testing is considered an integral part of a complete examination. Limited examinations for reoccurring indications may be performed as noted.  Right Carotid Findings: +----------+--------+--------+--------+------------------+--------+           PSV cm/sEDV cm/sStenosisPlaque DescriptionComments +----------+--------+--------+--------+------------------+--------+ CCA Prox  126     14                                         +----------+--------+--------+--------+------------------+--------+  CCA  Distal94      11                                         +----------+--------+--------+--------+------------------+--------+ ICA Prox  84      15                                         +----------+--------+--------+--------+------------------+--------+ ICA Mid   74      17                                         +----------+--------+--------+--------+------------------+--------+ ICA Distal79      21                                         +----------+--------+--------+--------+------------------+--------+ ECA       132     9                                          +----------+--------+--------+--------+------------------+--------+ +----------+--------+-------+----------------+-------------------+           PSV cm/sEDV cmsDescribe        Arm Pressure (mmHG) +----------+--------+-------+----------------+-------------------+ [id number]            Multiphasic, [id number]                 +----------+--------+-------+----------------+-------------------+ +---------+--------+--+--------+-+---------+ VertebralPSV cm/s36EDV cm/s7Antegrade +---------+--------+--+--------+-+---------+  Left Carotid Findings: +----------+--------+--------+--------+------------------+------------------+           PSV cm/sEDV cm/sStenosisPlaque DescriptionComments           +----------+--------+--------+--------+------------------+------------------+ CCA Prox  118     14                                                   +----------+--------+--------+--------+------------------+------------------+ CCA Distal88      16                                                   +----------+--------+--------+--------+------------------+------------------+ ICA Prox  81      18      1-39%   heterogenous      intimal thickening +----------+--------+--------+--------+------------------+------------------+ ICA Mid   68      22                                                    +----------+--------+--------+--------+------------------+------------------+ ICA Distal85      29                                                   +----------+--------+--------+--------+------------------+------------------+  ECA       90      9               heterogenous                         +----------+--------+--------+--------+------------------+------------------+ +----------+--------+--------+----------------+-------------------+           PSV cm/sEDV cm/sDescribe        Arm Pressure (mmHG) +----------+--------+--------+----------------+-------------------+ [id number]             Multiphasic, [id number]                 +----------+--------+--------+----------------+-------------------+ +---------+--------+--+--------+-+---------+ VertebralPSV cm/s34EDV cm/s7Antegrade +---------+--------+--+--------+-+---------+  Summary: Right Carotid: There was no evidence of thrombus, dissection, atherosclerotic                plaque or stenosis in the cervical carotid system. Left Carotid: Velocities in the left ICA are consistent with a 1-39% stenosis. Vertebrals:  Bilateral vertebral arteries demonstrate antegrade flow. Subclavians: Normal flow hemodynamics were seen in bilateral subclavian              arteries. *See table(s) above for measurements and observations.  Electronically signed by Jenkins Rouge MD on 03/09/2019 at 11:40:33 AM.    Final     Assessment & Plan:    Walker Kehr, MD

## 2021-01-30 ENCOUNTER — Other Ambulatory Visit: Payer: Self-pay | Admitting: Internal Medicine

## 2021-02-13 ENCOUNTER — Other Ambulatory Visit: Payer: Self-pay

## 2021-02-13 ENCOUNTER — Encounter: Payer: Self-pay | Admitting: Internal Medicine

## 2021-02-13 ENCOUNTER — Ambulatory Visit: Payer: BC Managed Care – PPO | Admitting: Internal Medicine

## 2021-02-13 DIAGNOSIS — F419 Anxiety disorder, unspecified: Secondary | ICD-10-CM

## 2021-02-13 DIAGNOSIS — M25562 Pain in left knee: Secondary | ICD-10-CM | POA: Diagnosis not present

## 2021-02-13 DIAGNOSIS — F32A Depression, unspecified: Secondary | ICD-10-CM

## 2021-02-13 DIAGNOSIS — G8929 Other chronic pain: Secondary | ICD-10-CM

## 2021-02-13 DIAGNOSIS — M5441 Lumbago with sciatica, right side: Secondary | ICD-10-CM

## 2021-02-13 DIAGNOSIS — M5442 Lumbago with sciatica, left side: Secondary | ICD-10-CM

## 2021-02-13 MED ORDER — TRIAMCINOLONE ACETONIDE 0.5 % EX CREA: 1.0000 "application " | TOPICAL_CREAM | Freq: Three times a day (TID) | CUTANEOUS | 2 refills | Status: AC | PRN

## 2021-02-13 MED ORDER — ACETAMINOPHEN-CODEINE 300-60 MG PO TABS: 1.0000 | ORAL_TABLET | Freq: Two times a day (BID) | ORAL | 3 refills | Status: DC | PRN

## 2021-02-13 NOTE — Patient Instructions (Signed)
Voltaren gel 

## 2021-02-13 NOTE — Assessment & Plan Note (Addendum)
New Exercise/ info given Voltaren gel Sports med ref offered

## 2021-02-13 NOTE — Assessment & Plan Note (Addendum)
Patellofemoral sx's L Info given

## 2021-02-13 NOTE — Progress Notes (Signed)
Subjective:  Patient ID: Paul [name]., male    DOB: [birth year]  Age: 75 y.o. MRN: [id number]  CC: Follow-up (3 MONTH F/U)   HPI Paul [name]. presents for chronic LBP, HTN, insomnia C/o L knee pain under the knee cap - worse w/walking steps   Outpatient Medications Prior to Visit  Medication Sig Dispense Refill   atorvastatin (LIPITOR) 10 MG tablet Take 1 tablet by mouth daily. 90 tablet 3   buPROPion (WELLBUTRIN XL) 300 MG 24 hr tablet Take 1 tablet by mouth daily. 90 tablet 2   ciclopirox (PENLAC) 8 % solution Apply topically at bedtime. Apply over nail and surrounding skin daily 6.6 mL 1   fenofibrate (TRICOR) 145 MG tablet TAKE 1 TABLET BY MOUTH EVERY DAY 30 tablet 3   finasteride (PROSCAR) 5 MG tablet Take 5 mg by mouth daily. Take 1 by mouth daily     Multiple Vitamins-Minerals (MENS MULTIVITAMIN) TABS Take by mouth. Take 1 tablet by mouth every day     pantoprazole (PROTONIX) 40 MG tablet Take 1 tablet (40 mg total) by mouth daily. 90 tablet 3   sildenafil (VIAGRA) 100 MG tablet TAKE 1 TABLET(100 MG) BY MOUTH DAILY AS NEEDED FOR ERECTILE DYSFUNCTION 12 tablet 2   SYRINGE-NEEDLE, DISP, 3 ML (BD ECLIPSE SYRINGE) 25G X 1" 3 ML MISC For IM injections 50 each 3   testosterone cypionate (DEPOTESTOSTERONE CYPIONATE) 200 MG/ML injection Inject 1 mL (200 mg total) into the muscle every 14 (fourteen) days. 10 mL 5   triamterene-hydrochlorothiazide (MAXZIDE-25) 37.5-25 MG tablet Take 1 tablet by mouth daily. (Patient taking differently: Take 0.5 tablets by mouth daily. Pt takes 1/2 tablet a day) 90 tablet 2   zolpidem (AMBIEN) 10 MG tablet 1 po qhs prn 90 tablet 1   acetaminophen-codeine (TYLENOL #4) 300-60 MG tablet Take 1 tablet by mouth every 12 (twelve) hours as needed. for pain 60 tablet 3   No facility-administered medications prior to visit.    ROS: Review of Systems  Constitutional:  Negative for appetite change, fatigue and unexpected weight change.  HENT:   Negative for congestion, nosebleeds, sneezing, sore throat and trouble swallowing.   Eyes:  Negative for itching and visual disturbance.  Respiratory:  Negative for cough.   Cardiovascular:  Negative for chest pain, palpitations and leg swelling.  Gastrointestinal:  Negative for abdominal distention, blood in stool, diarrhea and nausea.  Genitourinary:  Negative for frequency and hematuria.  Musculoskeletal:  Positive for arthralgias, back pain and gait problem. Negative for joint swelling and neck pain.  Skin:  Negative for rash.  Neurological:  Negative for dizziness, tremors, speech difficulty and weakness.  Psychiatric/Behavioral:  Negative for agitation, dysphoric mood and sleep disturbance. The patient is not nervous/anxious.    Objective:  BP 140/78 (BP Location: Left Arm)   Pulse 67   Temp 97.9 F (36.6 C) (Oral)   Ht 6' (1.829 m)   Wt 197 lb 12.8 oz (89.7 kg)   SpO2 96%   BMI 26.83 kg/m   BP Readings from Last 3 Encounters:  02/13/21 140/78  11/10/20 132/62  09/05/20 140/78    Wt Readings from Last 3 Encounters:  02/13/21 197 lb 12.8 oz (89.7 kg)  11/10/20 202 lb 3.2 oz (91.7 kg)  09/05/20 197 lb 12.8 oz (89.7 kg)    Physical Exam Constitutional:      General: He is not in acute distress.    Appearance: He is well-developed.     Comments:  NAD  Eyes:     Conjunctiva/sclera: Conjunctivae normal.     Pupils: Pupils are equal, round, and reactive to light.  Neck:     Thyroid: No thyromegaly.     Vascular: No JVD.  Cardiovascular:     Rate and Rhythm: Normal rate and regular rhythm.     Heart sounds: Normal heart sounds. No murmur heard.   No friction rub. No gallop.  Pulmonary:     Effort: Pulmonary effort is normal. No respiratory distress.     Breath sounds: Normal breath sounds. No wheezing or rales.  Chest:     Chest wall: No tenderness.  Abdominal:     General: Bowel sounds are normal. There is no distension.     Palpations: Abdomen is soft. There is  no mass.     Tenderness: There is no abdominal tenderness. There is no guarding or rebound.  Musculoskeletal:        General: No tenderness. Normal range of motion.     Cervical back: Normal range of motion.  Lymphadenopathy:     Cervical: No cervical adenopathy.  Skin:    General: Skin is warm and dry.     Findings: No rash.  Neurological:     Mental Status: He is alert and oriented to person, place, and time.     Cranial Nerves: No cranial nerve deficit.     Motor: No abnormal muscle tone.     Coordination: Coordination normal.     Gait: Gait normal.     Deep Tendon Reflexes: Reflexes are normal and symmetric.  Psychiatric:        Behavior: Behavior normal.        Thought Content: Thought content normal.        Judgment: Judgment normal.    Lab Results  Component Value Date   WBC 7.4 11/10/2020   HGB 15.3 11/10/2020   HCT 46.4 11/10/2020   PLT 260.0 11/10/2020   GLUCOSE 95 11/10/2020   CHOL 186 05/30/2020   TRIG 209.0 (H) 05/30/2020   HDL 51.30 05/30/2020   LDLDIRECT 96.0 05/30/2020   LDLCALC 98 01/04/2014   ALT 19 11/10/2020   AST 30 11/10/2020   NA 137 11/10/2020   K 4.4 11/10/2020   CL 101 11/10/2020   CREATININE 0.97 11/10/2020   BUN 21 11/10/2020   CO2 30 11/10/2020   TSH 2.73 09/05/2020   PSA 0.17 11/10/2020    ECHOCARDIOGRAM COMPLETE  Result Date: 03/09/2019   ECHOCARDIOGRAM REPORT   Patient Name:   Mamoudou Mulvehill. Date of Exam: 03/09/2019 Medical Rec #:  [id number]            Height:       72.0 in Accession #:    [id number]           Weight:       198.0 lb Date of Birth:  Feb 04, 1946            BSA:          2.12 m Patient Age:    75 years             BP:           126/70 mmHg Patient Gender: M                    HR:           65 bpm. Exam Location:  [facility] Procedure: 2D Echo, Cardiac Doppler and Color Doppler Indications:  R55 Syncope  History:        Patient has no prior history of Echocardiogram examinations.                 Risk  Factors:Hypertension and Dyslipidemia.  Sonographer:    Wilford Sports Rodgers-Jones RDCS Referring Phys: [place]  1. Left ventricular ejection fraction, by visual estimation, is 65 to 70%. The left ventricle has hyperdynamic function. There is no left ventricular hypertrophy.  2. Left ventricular diastolic parameters are consistent with Grade I diastolic dysfunction (impaired relaxation).  3. The left ventricle has no regional wall motion abnormalities.  4. Global right ventricle has normal systolic function.The right ventricular size is normal. No increase in right ventricular wall thickness.  5. Left atrial size was normal.  6. Right atrial size was normal.  7. The mitral valve is normal in structure. Mild mitral valve regurgitation. No evidence of mitral stenosis.  8. The tricuspid valve is normal in structure.  9. The aortic valve is normal in structure. Aortic valve regurgitation is not visualized. No evidence of aortic valve sclerosis or stenosis. 10. The pulmonic valve was normal in structure. Pulmonic valve regurgitation is trivial. 11. Normal pulmonary artery systolic pressure. 12. The tricuspid regurgitant velocity is 2.26 m/s, and with an assumed right atrial pressure of 3 mmHg, the estimated right ventricular systolic pressure is normal at 23.4 mmHg. 13. The inferior vena cava is normal in size with greater than 50% respiratory variability, suggesting right atrial pressure of 3 mmHg. FINDINGS  Left Ventricle: Left ventricular ejection fraction, by visual estimation, is 65 to 70%. The left ventricle has hyperdynamic function. The left ventricle has no regional wall motion abnormalities. There is no left ventricular hypertrophy. Left ventricular diastolic parameters are consistent with Grade I diastolic dysfunction (impaired relaxation). Normal left atrial pressure. Right Ventricle: The right ventricular size is normal. No increase in right ventricular wall thickness. Global RV  systolic function is has normal systolic function. The tricuspid regurgitant velocity is 2.26 m/s, and with an assumed right atrial pressure  of 3 mmHg, the estimated right ventricular systolic pressure is normal at 23.4 mmHg. Left Atrium: Left atrial size was normal in size. Right Atrium: Right atrial size was normal in size Pericardium: There is no evidence of pericardial effusion. Mitral Valve: The mitral valve is normal in structure. Mild mitral valve regurgitation. No evidence of mitral valve stenosis by observation. Tricuspid Valve: The tricuspid valve is normal in structure. Tricuspid valve regurgitation is mild. Aortic Valve: The aortic valve is normal in structure. Aortic valve regurgitation is not visualized. The aortic valve is structurally normal, with no evidence of sclerosis or stenosis. Pulmonic Valve: The pulmonic valve was normal in structure. Pulmonic valve regurgitation is trivial. Pulmonic regurgitation is trivial. Aorta: The aortic root, ascending aorta and aortic arch are all structurally normal, with no evidence of dilitation or obstruction. Venous: The inferior vena cava is normal in size with greater than 50% respiratory variability, suggesting right atrial pressure of 3 mmHg. IAS/Shunts: No atrial level shunt detected by color flow Doppler. There is no evidence of a patent foramen ovale. No ventricular septal defect is seen or detected. There is no evidence of an atrial septal defect.  LEFT VENTRICLE PLAX 2D LVIDd:         4.10 cm  Diastology LVIDs:         2.60 cm  LV e' lateral:   8.59 cm/s LV PW:  0.90 cm  LV E/e' lateral: 8.2 LV IVS:        1.10 cm  LV e' medial:    7.72 cm/s LVOT diam:     2.20 cm  LV E/e' medial:  9.1 LV SV:         50 ml LV SV Index:   23.09 LVOT Area:     3.80 cm  RIGHT VENTRICLE RV Basal diam:  3.70 cm RV S prime:     16.60 cm/s TAPSE (M-mode): 2.3 cm LEFT ATRIUM             Index       RIGHT ATRIUM           Index LA diam:        3.90 cm 1.84 cm/m  RA  Area:     12.50 cm LA Vol (A2C):   57.9 ml 27.29 ml/m RA Volume:   29.20 ml  13.76 ml/m LA Vol (A4C):   32.3 ml 15.23 ml/m LA Biplane Vol: 46.6 ml 21.97 ml/m  AORTIC VALVE LVOT Vmax:   92.85 cm/s LVOT Vmean:  66.600 cm/s LVOT VTI:    0.208 m  AORTA Ao Root diam: 4.20 cm Ao Asc diam:  3.90 cm MITRAL VALVE                        TRICUSPID VALVE MV Area (PHT): 4.21 cm             TR Peak grad:   20.4 mmHg MV PHT:        52.20 msec           TR Vmax:        226.00 cm/s MV Decel Time: 180 msec MV E velocity: 70.30 cm/s 103 cm/s  SHUNTS MV A velocity: 73.30 cm/s 70.3 cm/s Systemic VTI:  0.21 m MV E/A ratio:  0.96       1.5       Systemic Diam: 2.20 cm  Ena Dawley MD Electronically signed by Ena Dawley MD Signature Date/Time: 03/09/2019/4:18:25 PM    Final    VAS US CAROTID  Result Date: 03/09/2019 Carotid Arterial Duplex Study Indications:       Patient reports intermittent dizziness x 1 year and one                    presyncope episode occurring a couple of weeks ago. He                    relates his symptoms to blood pressure medication. He states                    since his prescription has be halved he has not experienced                    any of the symptoms. He denies any other cerebrovascular                    symptoms. Risk Factors:      Hypertension, hyperlipidemia, past history of smoking. Comparison Study:  NA Performing Technologist: Sharlett Iles RVT  Examination Guidelines: A complete evaluation includes B-mode imaging, spectral Doppler, color Doppler, and power Doppler as needed of all accessible portions of each vessel. Bilateral testing is considered an integral part of a complete examination. Limited examinations for reoccurring indications may be performed as noted.  Right Carotid Findings: +----------+--------+--------+--------+------------------+--------+  PSV cm/sEDV cm/sStenosisPlaque DescriptionComments  +----------+--------+--------+--------+------------------+--------+ CCA Prox  126     14                                         +----------+--------+--------+--------+------------------+--------+ CCA Distal94      11                                         +----------+--------+--------+--------+------------------+--------+ ICA Prox  84      15                                         +----------+--------+--------+--------+------------------+--------+ ICA Mid   74      17                                         +----------+--------+--------+--------+------------------+--------+ ICA Distal79      21                                         +----------+--------+--------+--------+------------------+--------+ ECA       132     9                                          +----------+--------+--------+--------+------------------+--------+ +----------+--------+-------+----------------+-------------------+           PSV cm/sEDV cmsDescribe        Arm Pressure (mmHG) +----------+--------+-------+----------------+-------------------+ [id number]            Multiphasic, [id number]                 +----------+--------+-------+----------------+-------------------+ +---------+--------+--+--------+-+---------+ VertebralPSV cm/s36EDV cm/s7Antegrade +---------+--------+--+--------+-+---------+  Left Carotid Findings: +----------+--------+--------+--------+------------------+------------------+           PSV cm/sEDV cm/sStenosisPlaque DescriptionComments           +----------+--------+--------+--------+------------------+------------------+ CCA Prox  118     14                                                   +----------+--------+--------+--------+------------------+------------------+ CCA Distal88      16                                                   +----------+--------+--------+--------+------------------+------------------+ ICA Prox  81      18       1-39%   heterogenous      intimal thickening +----------+--------+--------+--------+------------------+------------------+ ICA Mid   68      22                                                   +----------+--------+--------+--------+------------------+------------------+  ICA Distal85      29                                                   +----------+--------+--------+--------+------------------+------------------+ ECA       90      9               heterogenous                         +----------+--------+--------+--------+------------------+------------------+ +----------+--------+--------+----------------+-------------------+           PSV cm/sEDV cm/sDescribe        Arm Pressure (mmHG) +----------+--------+--------+----------------+-------------------+ [id number]             Multiphasic, [id number]                 +----------+--------+--------+----------------+-------------------+ +---------+--------+--+--------+-+---------+ VertebralPSV cm/s34EDV cm/s7Antegrade +---------+--------+--+--------+-+---------+  Summary: Right Carotid: There was no evidence of thrombus, dissection, atherosclerotic                plaque or stenosis in the cervical carotid system. Left Carotid: Velocities in the left ICA are consistent with a 1-39% stenosis. Vertebrals:  Bilateral vertebral arteries demonstrate antegrade flow. Subclavians: Normal flow hemodynamics were seen in bilateral subclavian              arteries. *See table(s) above for measurements and observations.  Electronically signed by Jenkins Rouge MD on 03/09/2019 at 11:40:33 AM.    Final     Assessment & Plan:   Problem List Items Addressed This Visit     Anxiety disorder    Chronic  On Wellbutrin XL Lion's mane      Chronic low back pain    Patellofemoral sx's L Info given      Relevant Medications   acetaminophen-codeine (TYLENOL #4) 300-60 MG tablet   Depression    Cont on Wellbutrin XL       Patellofemoral arthralgia of left knee    New Exercise/ info given Voltaren gel Sports med ref offered          Meds ordered this encounter  Medications   triamcinolone cream (KENALOG) 0.5 %    Sig: Apply 1 application topically 3 (three) times daily as needed.    Dispense:  45 g    Refill:  2   acetaminophen-codeine (TYLENOL #4) 300-60 MG tablet    Sig: Take 1 tablet by mouth every 12 (twelve) hours as needed. for pain    Dispense:  60 tablet    Refill:  3       Follow-up: Return in about 3 months (around 05/14/2021).  Walker Kehr, MD

## 2021-02-13 NOTE — Assessment & Plan Note (Signed)
Cont on Wellbutrin XL 

## 2021-02-13 NOTE — Assessment & Plan Note (Signed)
Chronic  On Wellbutrin XL Lion's mane

## 2021-03-09 ENCOUNTER — Other Ambulatory Visit: Payer: Self-pay | Admitting: Internal Medicine

## 2021-03-14 ENCOUNTER — Telehealth: Payer: Self-pay

## 2021-03-14 NOTE — Telephone Encounter (Signed)
Pt called in requesting refill on acetaminophen-codeine (TYLENOL #4) 300-60 MG tablet. I see where you ordered it on 12/29. Pharmacy states  Total Potential Morphine Milligram Equivalents Per Day Unknown  An error was encountered while attempting to calculate the morphine milligram equivalents per day for at least one order.   Please resend order.

## 2021-03-14 NOTE — Telephone Encounter (Signed)
I called pharmacy to see what information that was needed and pharmacy tech has stated rx was filled and ready for pick-up.  I called pt and informed him that his rx is ready for pick up.

## 2021-03-27 ENCOUNTER — Other Ambulatory Visit: Payer: Self-pay | Admitting: Internal Medicine
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
## 2021-03-29 ENCOUNTER — Ambulatory Visit: Payer: BC Managed Care – PPO | Admitting: Family Medicine

## 2021-03-29 ENCOUNTER — Other Ambulatory Visit: Payer: Self-pay

## 2021-03-29 ENCOUNTER — Ambulatory Visit (INDEPENDENT_AMBULATORY_CARE_PROVIDER_SITE_OTHER): Payer: BC Managed Care – PPO

## 2021-03-29 ENCOUNTER — Ambulatory Visit: Payer: Self-pay

## 2021-03-29 ENCOUNTER — Encounter: Payer: Self-pay | Admitting: Family Medicine

## 2021-03-29 VITALS — BP 140/78 | HR 67 | Ht 72.0 in | Wt 202.0 lb

## 2021-03-29 DIAGNOSIS — M25562 Pain in left knee: Secondary | ICD-10-CM

## 2021-03-29 NOTE — Assessment & Plan Note (Signed)
Patient does have what appears to be patellofemoral arthritis.  Likely worsening.  Does have x-rays from 2018 showing that there was some potential loose bodies.  On ultrasound today no significant Baker's cyst noted.  Trace effusion of the patellofemoral joint now as well as narrowing.  Patient given brace.  Home exercises and work with Product/process development scientist.  Discussed worsening pain will consider the possibility of injections and viscosupplementation.  Follow-up again in 6 weeks

## 2021-03-29 NOTE — Patient Instructions (Addendum)
Xray today Tru-pull lite w activity Tart cherry extract 1200mg  at night Voltaren gel Ice 20 min 2x a day See me in 5-6 weeks

## 2021-04-19 DIAGNOSIS — L821 Other seborrheic keratosis: Secondary | ICD-10-CM | POA: Diagnosis not present

## 2021-04-19 DIAGNOSIS — D485 Neoplasm of uncertain behavior of skin: Secondary | ICD-10-CM | POA: Diagnosis not present

## 2021-04-19 DIAGNOSIS — L578 Other skin changes due to chronic exposure to nonionizing radiation: Secondary | ICD-10-CM | POA: Diagnosis not present

## 2021-04-19 DIAGNOSIS — D225 Melanocytic nevi of trunk: Secondary | ICD-10-CM | POA: Diagnosis not present

## 2021-04-19 DIAGNOSIS — L570 Actinic keratosis: Secondary | ICD-10-CM | POA: Diagnosis not present

## 2021-04-26 ENCOUNTER — Other Ambulatory Visit: Payer: Self-pay | Admitting: Internal Medicine
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
## 2021-05-03 ENCOUNTER — Ambulatory Visit: Payer: BC Managed Care – PPO | Admitting: Family Medicine

## 2021-05-03 ENCOUNTER — Other Ambulatory Visit: Payer: Self-pay

## 2021-05-03 ENCOUNTER — Encounter: Payer: Self-pay | Admitting: Family Medicine

## 2021-05-03 DIAGNOSIS — M25562 Pain in left knee: Secondary | ICD-10-CM | POA: Diagnosis not present

## 2021-05-03 DIAGNOSIS — M5412 Radiculopathy, cervical region: Secondary | ICD-10-CM | POA: Diagnosis not present

## 2021-05-03 MED ORDER — GABAPENTIN 100 MG PO CAPS: 200.0000 mg | ORAL_CAPSULE | Freq: Every day | ORAL | 0 refills | Status: DC

## 2021-05-03 NOTE — Patient Instructions (Signed)
Scapular exercises Gabapentin 200mg  at night Hand outs to read about See me in 2 months

## 2021-05-03 NOTE — Assessment & Plan Note (Signed)
Continued difficulty.  We discussed the possibility of injections which patient wants to decline at the moment.  He is doing well with the bracing.  Patient given information about viscosupplementation as well as PRP as another potential treatment option.  Follow-up with me again in 4 to 6 weeks.

## 2021-05-03 NOTE — Assessment & Plan Note (Signed)
History of the cervical radiculopathy and multiple surgeries on the neck.  Given home exercises to work on the scapular strengthening.  We discussed icing regimen and home exercises otherwise.  Discussed with patient that gabapentin could be beneficial and given prescription.  Warned of potential side effects.  Discussed discontinuing the Ambien.  I expect patient to do relatively well though.  Follow-up with me again in 6 to 8 weeks.

## 2021-05-23 ENCOUNTER — Ambulatory Visit: Payer: BC Managed Care – PPO | Admitting: Internal Medicine

## 2021-05-25 ENCOUNTER — Other Ambulatory Visit: Payer: Self-pay | Admitting: Internal Medicine

## 2021-05-26 ENCOUNTER — Other Ambulatory Visit: Payer: Self-pay | Admitting: Internal Medicine

## 2021-06-08 ENCOUNTER — Other Ambulatory Visit: Payer: Self-pay | Admitting: Internal Medicine

## 2021-06-09 ENCOUNTER — Encounter: Payer: Self-pay | Admitting: Internal Medicine

## 2021-06-12 ENCOUNTER — Encounter: Payer: Self-pay | Admitting: Internal Medicine

## 2021-06-12 NOTE — Telephone Encounter (Signed)
Pt states pharmacy informed him there was an error when rx was sent ? ?Pt requesting rx to be resent ?

## 2021-06-12 NOTE — Telephone Encounter (Signed)
LOV 04/05/20

## 2021-06-13 ENCOUNTER — Telehealth: Payer: Self-pay

## 2021-06-13 NOTE — Telephone Encounter (Signed)
Pt is requesting a refill on: ?acetaminophen-codeine (TYLENOL #4) 300-60 MG tablet for a cough and for his back pain. ? ?Pharmacy: ?[facility], [address] [facility] [address] ? ?LOV 02/13/21 ?ROV 06/26/21 ?  ? ?

## 2021-06-15 ENCOUNTER — Encounter: Payer: Self-pay | Admitting: Internal Medicine

## 2021-06-15 NOTE — Telephone Encounter (Signed)
Was done.  Keep ROV ? Thanks ?

## 2021-06-19 ENCOUNTER — Other Ambulatory Visit: Payer: Self-pay | Admitting: Internal Medicine

## 2021-06-19 NOTE — Telephone Encounter (Signed)
Called pt MD states can see this Friday. Front office (Minisha) made appt for 8:30am. Pt is aware,..../lmb ?

## 2021-06-19 NOTE — Telephone Encounter (Signed)
Duplicate msg.. med already approved. See previous mychart  note.../lmb ?

## 2021-06-20 NOTE — Telephone Encounter (Signed)
Last filled 05/28/2021 #30. Next appt 06/23/21.Marland KitchenJohny [name] ?

## 2021-06-23 ENCOUNTER — Encounter: Payer: Self-pay | Admitting: Internal Medicine

## 2021-06-23 ENCOUNTER — Ambulatory Visit: Payer: BC Managed Care – PPO | Admitting: Internal Medicine

## 2021-06-23 DIAGNOSIS — Z85828 Personal history of other malignant neoplasm of skin: Secondary | ICD-10-CM | POA: Insufficient documentation

## 2021-06-23 DIAGNOSIS — C44329 Squamous cell carcinoma of skin of other parts of face: Secondary | ICD-10-CM | POA: Insufficient documentation

## 2021-06-23 DIAGNOSIS — F419 Anxiety disorder, unspecified: Secondary | ICD-10-CM | POA: Diagnosis not present

## 2021-06-23 DIAGNOSIS — M5442 Lumbago with sciatica, left side: Secondary | ICD-10-CM

## 2021-06-23 DIAGNOSIS — Z808 Family history of malignant neoplasm of other organs or systems: Secondary | ICD-10-CM | POA: Insufficient documentation

## 2021-06-23 DIAGNOSIS — D225 Melanocytic nevi of trunk: Secondary | ICD-10-CM | POA: Insufficient documentation

## 2021-06-23 DIAGNOSIS — J4520 Mild intermittent asthma, uncomplicated: Secondary | ICD-10-CM

## 2021-06-23 DIAGNOSIS — M5441 Lumbago with sciatica, right side: Secondary | ICD-10-CM

## 2021-06-23 DIAGNOSIS — G8929 Other chronic pain: Secondary | ICD-10-CM

## 2021-06-23 DIAGNOSIS — K2100 Gastro-esophageal reflux disease with esophagitis, without bleeding: Secondary | ICD-10-CM

## 2021-06-23 DIAGNOSIS — L821 Other seborrheic keratosis: Secondary | ICD-10-CM | POA: Insufficient documentation

## 2021-06-23 MED ORDER — TRELEGY ELLIPTA 100-62.5-25 MCG/ACT IN AEPB
1.0000 | INHALATION_SPRAY | Freq: Every day | RESPIRATORY_TRACT | 11 refills | Status: DC
Start: 2021-06-23 — End: 2021-12-11

## 2021-06-23 NOTE — Patient Instructions (Signed)
Blue-Emu cream -- use 2-3 times a day ? ?

## 2021-06-23 NOTE — Assessment & Plan Note (Signed)
Worse ?Will try Trelegy ?

## 2021-06-23 NOTE — Assessment & Plan Note (Addendum)
Blue-Emu cream was recommended to use 2-3 times a day ?T#4 prn ?

## 2021-06-23 NOTE — Progress Notes (Signed)
? ?Subjective:  ?Patient ID: Paul Baron., male    DOB: Mar 09, 1946  Age: 76 y.o. MRN: [id number] ? ?CC: No chief complaint on file. ? ? ?HPI ?Paul Baron. presents for LBP, anxiety, GERD f/u ?C/o occasional spastic cough x 12 years. Occasional wheezing. Remote h/o asthma ? ?Outpatient Medications Prior to Visit  ?Medication Sig Dispense Refill  ? acetaminophen-codeine (TYLENOL #4) 300-60 MG tablet TAKE 1 TABLET BY MOUTH EVERY 12 HOURS AS NEEDED FOR PAIN 60 tablet 0  ? atorvastatin (LIPITOR) 10 MG tablet Take 1 tablet by mouth daily. 90 tablet 3  ? buPROPion (WELLBUTRIN XL) 300 MG 24 hr tablet Take 1 tablet by mouth daily. 90 tablet 0  ? ciclopirox (PENLAC) 8 % solution Apply topically at bedtime. Apply over nail and surrounding skin daily 6.6 mL 1  ? fenofibrate (TRICOR) 145 MG tablet TAKE 1 TABLET BY MOUTH EVERY DAY 30 tablet 3  ? finasteride (PROSCAR) 5 MG tablet Take 1 tablet by mouth daily. 90 tablet 3  ? gabapentin (NEURONTIN) 100 MG capsule Take 2 capsules (200 mg total) by mouth at bedtime. 180 capsule 0  ? Multiple Vitamins-Minerals (MENS MULTIVITAMIN) TABS Take by mouth. Take 1 tablet by mouth every day    ? sildenafil (VIAGRA) 100 MG tablet TAKE 1 TABLET(100 MG) BY MOUTH DAILY AS NEEDED FOR ERECTILE DYSFUNCTION 12 tablet 2  ? triamcinolone cream (KENALOG) 0.5 % Apply 1 application topically 3 (three) times daily as needed. 45 g 2  ? triamterene-hydrochlorothiazide (MAXZIDE-25) 37.5-25 MG tablet Take 1 tablet by mouth daily. 90 tablet 3  ? zolpidem (AMBIEN) 10 MG tablet TAKE 1 TABLET(10 MG) BY MOUTH AT BEDTIME AS NEEDED FOR SLEEP 30 tablet 0  ? pantoprazole (PROTONIX) 40 MG tablet Take 1 tablet (40 mg total) by mouth daily. Annual appt due in March must see provider for future refills 90 tablet 0  ? SYRINGE-NEEDLE, DISP, 3 ML (BD ECLIPSE SYRINGE) 25G X 1" 3 ML MISC For IM injections 50 each 3  ? testosterone cypionate (DEPOTESTOSTERONE CYPIONATE) 200 MG/ML injection Inject 1 mL (200 mg total)  into the muscle every 14 (fourteen) days. 10 mL 5  ? ?No facility-administered medications prior to visit.  ? ? ?ROS: ?Review of Systems  ?Constitutional:  Negative for appetite change, fatigue and unexpected weight change.  ?HENT:  Negative for congestion, nosebleeds, sneezing, sore throat and trouble swallowing.   ?Eyes:  Negative for itching and visual disturbance.  ?Respiratory:  Positive for cough and wheezing. Negative for shortness of breath.   ?Cardiovascular:  Negative for chest pain, palpitations and leg swelling.  ?Gastrointestinal:  Negative for abdominal distention, blood in stool, diarrhea and nausea.  ?Genitourinary:  Negative for frequency and hematuria.  ?Musculoskeletal:  Positive for back pain. Negative for gait problem, joint swelling and neck pain.  ?Skin:  Negative for rash.  ?Neurological:  Negative for dizziness, tremors, speech difficulty and weakness.  ?Psychiatric/Behavioral:  Negative for agitation, dysphoric mood and sleep disturbance. The patient is not nervous/anxious.   ? ?Objective:  ?BP 110/68 (BP Location: Left Arm, Patient Position: Sitting, Cuff Size: Large)   Pulse 68   Temp 98.2 ?F (36.8 ?C) (Oral)   Ht 6' (1.829 m)   Wt 202 lb (91.6 kg)   SpO2 94%   BMI 27.40 kg/m?  ? ?BP Readings from Last 3 Encounters:  ?06/23/21 110/68  ?05/03/21 140/80  ?03/29/21 140/78  ? ? ?Wt Readings from Last 3 Encounters:  ?06/23/21 202 lb (91.6 kg)  ?  05/03/21 201 lb (91.2 kg)  ?03/29/21 202 lb (91.6 kg)  ? ? ?Physical Exam ?Constitutional:   ?   General: He is not in acute distress. ?   Appearance: He is well-developed.  ?   Comments: NAD  ?Eyes:  ?   Conjunctiva/sclera: Conjunctivae normal.  ?   Pupils: Pupils are equal, round, and reactive to light.  ?Neck:  ?   Thyroid: No thyromegaly.  ?   Vascular: No JVD.  ?Cardiovascular:  ?   Rate and Rhythm: Normal rate and regular rhythm.  ?   Heart sounds: Normal heart sounds. No murmur heard. ?  No friction rub. No gallop.  ?Pulmonary:  ?   Effort:  Pulmonary effort is normal. No respiratory distress.  ?   Breath sounds: Normal breath sounds. No wheezing or rales.  ?Chest:  ?   Chest wall: No tenderness.  ?Abdominal:  ?   General: Bowel sounds are normal. There is no distension.  ?   Palpations: Abdomen is soft. There is no mass.  ?   Tenderness: There is no abdominal tenderness. There is no guarding or rebound.  ?Musculoskeletal:     ?   General: No tenderness. Normal range of motion.  ?   Cervical back: Normal range of motion.  ?Lymphadenopathy:  ?   Cervical: No cervical adenopathy.  ?Skin: ?   General: Skin is warm and dry.  ?   Findings: No rash.  ?Neurological:  ?   Mental Status: He is alert and oriented to person, place, and time.  ?   Cranial Nerves: No cranial nerve deficit.  ?   Motor: No abnormal muscle tone.  ?   Coordination: Coordination normal.  ?   Gait: Gait normal.  ?   Deep Tendon Reflexes: Reflexes are normal and symmetric.  ?Psychiatric:     ?   Behavior: Behavior normal.     ?   Thought Content: Thought content normal.     ?   Judgment: Judgment normal.  ? ?I personally provided Trelegy inhaler use teaching. After teaching the patient should be able to use effectively. All questions were answered ? ?Lab Results  ?Component Value Date  ? WBC 7.4 11/10/2020  ? HGB 15.3 11/10/2020  ? HCT 46.4 11/10/2020  ? PLT 260.0 11/10/2020  ? GLUCOSE 95 11/10/2020  ? CHOL 186 05/30/2020  ? TRIG 209.0 (H) 05/30/2020  ? HDL 51.30 05/30/2020  ? LDLDIRECT 96.0 05/30/2020  ? [place] 98 01/04/2014  ? ALT 19 11/10/2020  ? AST 30 11/10/2020  ? NA 137 11/10/2020  ? K 4.4 11/10/2020  ? CL 101 11/10/2020  ? CREATININE 0.97 11/10/2020  ? BUN 21 11/10/2020  ? CO2 30 11/10/2020  ? TSH 2.73 09/05/2020  ? PSA 0.17 11/10/2020  ? ? ?ECHOCARDIOGRAM COMPLETE ? ?Result Date: 03/09/2019 ?  ECHOCARDIOGRAM REPORT   Patient Name:   Paul Mroczka. Date of Exam: 03/09/2019 Medical Rec #:  [id number]            Height:       72.0 in Accession #:    [id number]           Weight:        198.0 lb Date of Birth:  January 23, 1946            BSA:          2.12 m? Patient Age:    35 years  BP:           126/70 mmHg Patient Gender: M                    HR:           65 bpm. Exam Location:  [address] Procedure: 2D Echo, Cardiac Doppler and Color Doppler Indications:    R55 Syncope  History:        Patient has no prior history of Echocardiogram examinations.                 Risk Factors:Hypertension and Dyslipidemia.  Sonographer:    [facility] [employer] Rodgers-Jones RDCS Referring Phys: [place]  1. Left ventricular ejection fraction, by visual estimation, is 65 to 70%. The left ventricle has hyperdynamic function. There is no left ventricular hypertrophy.  2. Left ventricular diastolic parameters are consistent with Grade I diastolic dysfunction (impaired relaxation).  3. The left ventricle has no regional wall motion abnormalities.  4. Global right ventricle has normal systolic function.The right ventricular size is normal. No increase in right ventricular wall thickness.  5. Left atrial size was normal.  6. Right atrial size was normal.  7. The mitral valve is normal in structure. Mild mitral valve regurgitation. No evidence of mitral stenosis.  8. The tricuspid valve is normal in structure.  9. The aortic valve is normal in structure. Aortic valve regurgitation is not visualized. No evidence of aortic valve sclerosis or stenosis. 10. The pulmonic valve was normal in structure. Pulmonic valve regurgitation is trivial. 11. Normal pulmonary artery systolic pressure. 12. The tricuspid regurgitant velocity is 2.26 m/s, and with an assumed right atrial pressure of 3 mmHg, the estimated right ventricular systolic pressure is normal at 23.4 mmHg. 13. The inferior vena cava is normal in size with greater than 50% respiratory variability, suggesting right atrial pressure of 3 mmHg. FINDINGS  Left Ventricle: Left ventricular ejection fraction, by visual estimation, is 65 to 70%.  The left ventricle has hyperdynamic function. The left ventricle has no regional wall motion abnormalities. There is no left ventricular hypertrophy. Left ventricular diastolic parameters are consistent w

## 2021-06-26 ENCOUNTER — Ambulatory Visit: Payer: BC Managed Care – PPO | Admitting: Internal Medicine

## 2021-06-29 ENCOUNTER — Ambulatory Visit: Payer: BC Managed Care – PPO | Admitting: Family Medicine

## 2021-06-29 ENCOUNTER — Other Ambulatory Visit: Payer: Self-pay | Admitting: Internal Medicine

## 2021-07-02 NOTE — Assessment & Plan Note (Signed)
On Protonix  Potential benefits of a long term PPI use as well as potential risks  and complications were explained to the patient and were aknowledged. 

## 2021-07-02 NOTE — Assessment & Plan Note (Signed)
Continue on Wellbutrin ?

## 2021-07-12 DIAGNOSIS — L918 Other hypertrophic disorders of the skin: Secondary | ICD-10-CM | POA: Diagnosis not present

## 2021-07-17 ENCOUNTER — Encounter: Payer: Self-pay | Admitting: Internal Medicine
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
## 2021-07-27 ENCOUNTER — Ambulatory Visit: Payer: BC Managed Care – PPO | Admitting: Family Medicine

## 2021-07-28 ENCOUNTER — Encounter: Payer: Self-pay | Admitting: *Deleted

## 2021-08-08 ENCOUNTER — Ambulatory Visit: Payer: BC Managed Care – PPO | Admitting: Sports Medicine

## 2021-08-08 ENCOUNTER — Ambulatory Visit (INDEPENDENT_AMBULATORY_CARE_PROVIDER_SITE_OTHER): Payer: BC Managed Care – PPO

## 2021-08-08 VITALS — BP 122/72 | HR 41 | Ht 72.0 in | Wt 202.0 lb

## 2021-08-08 DIAGNOSIS — M25552 Pain in left hip: Secondary | ICD-10-CM

## 2021-08-08 DIAGNOSIS — M25512 Pain in left shoulder: Secondary | ICD-10-CM

## 2021-08-08 MED ORDER — MELOXICAM 15 MG PO TABS: 15.0000 mg | ORAL_TABLET | Freq: Every day | ORAL | 0 refills | Status: DC

## 2021-08-08 NOTE — Patient Instructions (Addendum)
Good to see you  - Start meloxicam 15 mg daily x2 weeks.  If still having pain after 2 weeks, complete 3rd-week of meloxicam. May use remaining meloxicam as needed once daily for pain control.  Do not to use additional NSAIDs while taking meloxicam.  May use Tylenol [phone] mg 2 to 3 times a day for breakthrough pain. HEP for rotator cuff glute  and hamstring 3 week follow up

## 2021-08-08 NOTE — Progress Notes (Signed)
Paul [name] D.[place] Phone: [phone]   Assessment and Plan:     1. Acute pain of left shoulder 2. Left hip pain -Acute, uncomplicated, initial sports medicine visit - Acute pains of left shoulder and left hip after fall caused by patient's dog running away and yanking patient to the ground while attached to leash.  Pains likely represent rotator cuff strain and lateral/posterior hip and hamstring contusion with relatively unremarkable imaging at today's visit - X-rays obtained in clinic.  My interpretation: No acute fracture or dislocation of shoulder or hip.  Moderate cortical changes of intra-articular left hip.  Trace intra-articular changes of glenohumeral joint - Start meloxicam 15 mg daily x2 weeks.  If still having pain after 2 weeks, complete 3rd-week of meloxicam. May use remaining meloxicam as needed once daily for pain control.  Do not to use additional NSAIDs while taking meloxicam.  May use Tylenol [phone] mg 2 to 3 times a day for breakthrough pain. - Start HEP for hamstring, glutes, rotator cuff - DG HIP UNILAT WITH PELVIS 2-3 VIEWS LEFT; Future    Pertinent previous records reviewed include none   Follow Up: 3 weeks.  would consider ultrasound if continued pain    Subjective:   I, Paul [name], am serving as a Education administrator for Doctor Paul [name]  Chief Complaint: left shoulder and left hamstring pain   HPI:   08/08/21 Patient is a 76 year old male complaining of left shoulder and hamstring pain. Patient states that he was in the rain with his dog and the dog took of running and he extended his arm and he was over extended and fell to the ground and he hit his shoulder and leg the hip is in pain, this happened Sunday afternoon, no numbness or tingling down both hip or shoulder just pain, no radiating pain on either, getting in and out of car is a nightmare, putting on sucks is painful  as well, has not taken any meds for the pain since yesterday  Relevant Historical Information: Hypertension, GERD  Additional pertinent review of systems negative.   Current Outpatient Medications:    meloxicam (MOBIC) 15 MG tablet, Take 1 tablet (15 mg total) by mouth daily., Disp: 30 tablet, Rfl: 0   acetaminophen-codeine (TYLENOL #4) 300-60 MG tablet, TAKE 1 TABLET BY MOUTH EVERY 12 HOURS AS NEEDED FOR PAIN, Disp: 60 tablet, Rfl: 0   atorvastatin (LIPITOR) 10 MG tablet, Take 1 tablet by mouth daily., Disp: 90 tablet, Rfl: 3   buPROPion (WELLBUTRIN XL) 300 MG 24 hr tablet, Take 1 tablet by mouth daily., Disp: 90 tablet, Rfl: 0   ciclopirox (PENLAC) 8 % solution, Apply topically at bedtime. Apply over nail and surrounding skin daily, Disp: 6.6 mL, Rfl: 1   fenofibrate (TRICOR) 145 MG tablet, TAKE 1 TABLET BY MOUTH EVERY DAY, Disp: 30 tablet, Rfl: 3   finasteride (PROSCAR) 5 MG tablet, Take 1 tablet by mouth daily., Disp: 90 tablet, Rfl: 3   Fluticasone-Umeclidin-Vilant (TRELEGY ELLIPTA) 100-62.5-25 MCG/ACT AEPB, Inhale 1 puff into the lungs daily., Disp: 1 each, Rfl: 11   gabapentin (NEURONTIN) 100 MG capsule, Take 2 capsules (200 mg total) by mouth at bedtime., Disp: 180 capsule, Rfl: 0   Multiple Vitamins-Minerals (MENS MULTIVITAMIN) TABS, Take by mouth. Take 1 tablet by mouth every day, Disp: , Rfl:    pantoprazole (PROTONIX) 40 MG tablet, Take 1 tablet (40 mg total) by mouth daily. Must  keep July appt for future refills, Disp: 90 tablet, Rfl: 0   sildenafil (VIAGRA) 100 MG tablet, TAKE 1 TABLET(100 MG) BY MOUTH DAILY AS NEEDED FOR ERECTILE DYSFUNCTION, Disp: 12 tablet, Rfl: 2   triamcinolone cream (KENALOG) 0.5 %, Apply 1 application topically 3 (three) times daily as needed., Disp: 45 g, Rfl: 2   triamterene-hydrochlorothiazide (MAXZIDE-25) 37.5-25 MG tablet, Take 1 tablet by mouth daily., Disp: 90 tablet, Rfl: 3   zolpidem (AMBIEN) 10 MG tablet, TAKE 1 TABLET(10 MG) BY MOUTH AT BEDTIME  AS NEEDED FOR SLEEP, Disp: 30 tablet, Rfl: 0   Objective:     Vitals:   08/08/21 0949  BP: 122/72  Pulse: (!) 41  SpO2: 99%  Weight: 202 lb (91.6 kg)  Height: 6' (1.829 m)      Body mass index is 27.4 kg/m.    Physical Exam:    General: awake, alert, and oriented no acute distress, nontoxic Skin: no suspicious lesions or rashes Neuro:sensation intact distally with no dificits, normal muscle tone, no atrophy, strength 5/5 in all tested lower ext groups Psych: normal mood and affect, speech clear  Left hip: No deformity, swelling or wasting ROM Flexion 90, ext 30, IR 45, ER 45 TTP gluteal musculature, proximal hamstring NTTP over the hip flexors, greater troch, si joint, lumbar spine Negative log roll with FROM Negative FABER Positive FADIR Negative Piriformis test Negative trendelenberg Gait abnormal, favoring right side    Left shoulder: no deformity, swelling or muscle wasting No scapular winging FF 170, abd 170 (painful arc), int 0, ext 90 NTTP over the [place], clavicle, ac, coracoid, biceps groove, humerus, deltoid, trapezius, cervical spine Positive neer, hawkings, empty can, obriens, crossarm Neg ant drawer, sulcus sign, apprehension Negative subscap liftoff, speeds,  Negative Spurling's test bilat FROM of neck    Electronically signed by:  Paul [name] D.[facility] 10:24 AM 08/08/21

## 2021-08-14 ENCOUNTER — Ambulatory Visit (INDEPENDENT_AMBULATORY_CARE_PROVIDER_SITE_OTHER): Payer: BC Managed Care – PPO

## 2021-08-14 ENCOUNTER — Encounter: Payer: Self-pay | Admitting: Family Medicine

## 2021-08-14 ENCOUNTER — Ambulatory Visit: Payer: BC Managed Care – PPO | Admitting: Sports Medicine

## 2021-08-14 VITALS — BP 140/80 | HR 76 | Ht 72.0 in | Wt 202.0 lb

## 2021-08-14 DIAGNOSIS — M79672 Pain in left foot: Secondary | ICD-10-CM

## 2021-08-14 DIAGNOSIS — M7989 Other specified soft tissue disorders: Secondary | ICD-10-CM

## 2021-08-14 DIAGNOSIS — S76312D Strain of muscle, fascia and tendon of the posterior muscle group at thigh level, left thigh, subsequent encounter: Secondary | ICD-10-CM | POA: Diagnosis not present

## 2021-08-14 NOTE — Progress Notes (Signed)
Paul [name] D.[place] Phone: [phone]   Assessment and Plan:     1. Swelling of left foot 2. Strain of left hamstring muscle, subsequent encounter -Acute, uncomplicated, subsequent with - Patient presents today with significant swelling in left lower extremity with bruising and foot.  Based on HPI, physical exam, this likely represents a proximal hamstring strain/tear with edema and bruising slowly progressing down lower extremity over the past 1 week since patient's fall.  No red flag signs on physical exam and hip, femur, knee, tib-fib, ankle, foot with unremarkable imaging of foot.  Patient can walk relatively pain-free and does not have weakness of left hamstring - Recommend pain-free ambulation for the next 2 weeks.  Can use crutches or cane if ambulation becomes painful with weightbearing as tolerated - Continue meloxicam 15 mg daily x2 weeks - Recommend RICE therapy, focusing on elevation of left lower extremity when possible and using compression/athletic socks -X-ray obtained in clinic.  My interpretation: No acute fracture or dislocation.  Cortical irregularity at first cuneiform that does NOT correlate to TTP on physical exam  Pertinent previous records reviewed include none   Follow Up: 2 weeks for reevaluation.  If patient is improving, would advance HEP for hamstring to include strengthening.  If no improvement or worsening of symptoms, would perform ultrasound for further evaluation of potential hamstring tear   Subjective:   I, Paul [name], am serving as a Education administrator for Doctor Paul [name]  Chief Complaint: left foot pain   HPI:   08/14/21 Patient is a 76 year old male complaining of left foot pain. Patient states that he noticed the bruising on Friday , slight pain its just all the swelling that feels like its stretching his skin out, is TTP to the whole leg and shin area  Relevant  Historical Information: Hypertension, GERD  Additional pertinent review of systems negative.   Current Outpatient Medications:    acetaminophen-codeine (TYLENOL #4) 300-60 MG tablet, TAKE 1 TABLET BY MOUTH EVERY 12 HOURS AS NEEDED FOR PAIN, Disp: 60 tablet, Rfl: 0   atorvastatin (LIPITOR) 10 MG tablet, Take 1 tablet by mouth daily., Disp: 90 tablet, Rfl: 3   buPROPion (WELLBUTRIN XL) 300 MG 24 hr tablet, Take 1 tablet by mouth daily., Disp: 90 tablet, Rfl: 0   ciclopirox (PENLAC) 8 % solution, Apply topically at bedtime. Apply over nail and surrounding skin daily, Disp: 6.6 mL, Rfl: 1   fenofibrate (TRICOR) 145 MG tablet, TAKE 1 TABLET BY MOUTH EVERY DAY, Disp: 30 tablet, Rfl: 3   finasteride (PROSCAR) 5 MG tablet, Take 1 tablet by mouth daily., Disp: 90 tablet, Rfl: 3   Fluticasone-Umeclidin-Vilant (TRELEGY ELLIPTA) 100-62.5-25 MCG/ACT AEPB, Inhale 1 puff into the lungs daily., Disp: 1 each, Rfl: 11   gabapentin (NEURONTIN) 100 MG capsule, Take 2 capsules (200 mg total) by mouth at bedtime., Disp: 180 capsule, Rfl: 0   meloxicam (MOBIC) 15 MG tablet, Take 1 tablet (15 mg total) by mouth daily., Disp: 30 tablet, Rfl: 0   Multiple Vitamins-Minerals (MENS MULTIVITAMIN) TABS, Take by mouth. Take 1 tablet by mouth every day, Disp: , Rfl:    pantoprazole (PROTONIX) 40 MG tablet, Take 1 tablet (40 mg total) by mouth daily. Must keep July appt for future refills, Disp: 90 tablet, Rfl: 0   sildenafil (VIAGRA) 100 MG tablet, TAKE 1 TABLET(100 MG) BY MOUTH DAILY AS NEEDED FOR ERECTILE DYSFUNCTION, Disp: 12 tablet, Rfl: 2  triamcinolone cream (KENALOG) 0.5 %, Apply 1 application topically 3 (three) times daily as needed., Disp: 45 g, Rfl: 2   triamterene-hydrochlorothiazide (MAXZIDE-25) 37.5-25 MG tablet, Take 1 tablet by mouth daily., Disp: 90 tablet, Rfl: 3   zolpidem (AMBIEN) 10 MG tablet, TAKE 1 TABLET(10 MG) BY MOUTH AT BEDTIME AS NEEDED FOR SLEEP, Disp: 30 tablet, Rfl: 0   Objective:     Vitals:    08/14/21 1316  BP: 140/80  Pulse: 76  SpO2: 97%  Weight: 202 lb (91.6 kg)  Height: 6' (1.829 m)      Body mass index is 27.4 kg/m.    Physical Exam:    General: awake, alert, and oriented no acute distress, nontoxic Skin: no suspicious lesions or rashes Neuro:sensation intact distally with no dificits, normal muscle tone, no atrophy, strength 5/5 in all tested lower ext groups Psych: normal mood and affect, speech clear  Left hip: No deformity or wasting 2+ pitting edema distal to knee with ecchymosis over hamstring, anterior shin, ankle, foot.  Neurovascularly intact ROM Flexion 90, ext 15, IR 45, ER 45 TTP gluteal musculature and proximal hamstring NTTP over the hip flexors, greater troch,   si joint, lumbar spine Negative log roll with FROM Negative FABER Positive FADIR Negative Piriformis test   Gait normal    Electronically signed by:  Paul [name] D.[facility] 1:53 PM 08/14/21

## 2021-08-14 NOTE — Patient Instructions (Addendum)
Good to see you  Continue meloxicam 15 mg daily for 2 weeks Start tylenol 1000 mg nightly  Continue HEP for hamstring and shoulder Keep follow up appointment

## 2021-08-21 ENCOUNTER — Ambulatory Visit (INDEPENDENT_AMBULATORY_CARE_PROVIDER_SITE_OTHER): Payer: BC Managed Care – PPO

## 2021-08-21 ENCOUNTER — Ambulatory Visit: Payer: BC Managed Care – PPO | Admitting: Sports Medicine

## 2021-08-21 ENCOUNTER — Ambulatory Visit: Payer: Self-pay

## 2021-08-21 VITALS — BP 120/70 | HR 73 | Ht 72.0 in | Wt 202.0 lb

## 2021-08-21 DIAGNOSIS — M79662 Pain in left lower leg: Secondary | ICD-10-CM | POA: Diagnosis not present

## 2021-08-21 DIAGNOSIS — M79605 Pain in left leg: Secondary | ICD-10-CM | POA: Diagnosis not present

## 2021-08-21 DIAGNOSIS — M25562 Pain in left knee: Secondary | ICD-10-CM | POA: Diagnosis not present

## 2021-08-21 DIAGNOSIS — S76312D Strain of muscle, fascia and tendon of the posterior muscle group at thigh level, left thigh, subsequent encounter: Secondary | ICD-10-CM

## 2021-08-21 MED ORDER — TRAMADOL HCL 50 MG PO TABS: 50.0000 mg | ORAL_TABLET | Freq: Three times a day (TID) | ORAL | 0 refills | Status: AC | PRN

## 2021-08-21 NOTE — Progress Notes (Signed)
Benito Mccreedy D.[place] Phone: [phone]   Assessment and Plan:     1. Strain of left hamstring muscle, subsequent encounter 2. Tear of left hamstring, subsequent encounter 3. Acute pain of left knee 4. Left leg pain -Acute, uncertain prognosis, subsequent visit - Tear of proximal left hamstring tendon from hamstring origin without bony abnormality based on ultrasound and prior x-ray. - Due to continued pain and pressure with large hematoma seen on ultrasound, patient elected to proceed with drainage.  Tolerated well per note below - Patient has no decrease in function since his fall and tear of hamstring 2 weeks ago.  He is able to walk without pain.  Patient's pain comes from pressure of sitting on hematoma as well as lower extremity edema that builds up through the day and becomes a throbbing pain overnight.  As patient has no loss of function, no evidence of fracture based on x-ray or ultrasound, I believe he will do well with nonsurgical management. - Start Tylenol 500 to 1000 mg tablets 2-3 times a day for day-to-day pain relief - Start tramadol 50 mg every 8 hours as needed for severe pain - X-rays obtained in clinic.  My interpretation: No acute fracture or dislocation.  Free bodies in posterior knee, and patellar spurring.  No acute explanation for patient's lower extremity pain based on imaging.  I believe pain is primarily due to swelling that builds up throughout the day - Recommend elevation of left lower extremity above heart, as much as possible during the day   Procedure: Ultrasound Guided Hamstring Origin (Ischial Tuberosity) drainage Side: Left Diagnosis: Left hamstring origin tear [place] Indication:  - accuracy is paramount for diagnosis - to ensure therapeutic efficacy or procedural success - to reduce procedural risk  After explaining the procedure, viable alternatives, risks, and answering  any questions, consent was given verbally. The site was cleaned with chlorhexidine prep. An ultrasound transducer was placed on the posterior upper thigh/buttock.  There was large fluid collection of   tear in tendon.  2 mL 1% lidocaine was injected to subcutaneous area, and then an 18-gauge needle was advanced through this area   to the hypoechoic area under ultrasound guidance with sterile technique.  95 mL bloody aspiration was removed, and the needle was removed.  This was well tolerated and resulted in  relief.    dressing placed and post injection instructions were given. Pt was advised to call or return to clinic if these symptoms worsen or fail to improve as anticipated.   Pertinent previous records reviewed include none   Follow Up: 2 weeks for reevaluation.  If patient is improving, would advance HEP for hamstring to include strengthening.  If no improvement or worsening of symptoms, could repeat ultrasound versus discussing advanced imaging   Subjective:    I, [place], am serving as a Education administrator for Doctor Glennon Mac   Chief Complaint: left foot pain    HPI:    08/14/21 Patient is a 76 year old male complaining of left foot pain. Patient states that he noticed the bruising on Friday , slight pain its just all the swelling that feels like its stretching his skin out, is TTP to the whole leg and shin area  08/21/2021 Patient states that he is still in pain, isn't able to sleep more than 4 hours    Relevant Historical Information: Hypertension, GERD  Additional pertinent review of systems negative.  Current Outpatient Medications:    traMADol (ULTRAM) 50 MG tablet, Take 1 tablet (50 mg total) by mouth every 8 (eight) hours as needed for up to 5 days for severe pain., Disp: 15 tablet, Rfl: 0   acetaminophen-codeine (TYLENOL #4) 300-60 MG tablet, TAKE 1 TABLET BY MOUTH EVERY 12 HOURS AS NEEDED FOR PAIN, Disp: 60 tablet, Rfl: 0   atorvastatin (LIPITOR) 10 MG tablet, Take 1  tablet by mouth daily., Disp: 90 tablet, Rfl: 3   buPROPion (WELLBUTRIN XL) 300 MG 24 hr tablet, Take 1 tablet by mouth daily., Disp: 90 tablet, Rfl: 0   ciclopirox (PENLAC) 8 % solution, Apply topically at bedtime. Apply over nail and surrounding skin daily, Disp: 6.6 mL, Rfl: 1   fenofibrate (TRICOR) 145 MG tablet, TAKE 1 TABLET BY MOUTH EVERY DAY, Disp: 30 tablet, Rfl: 3   finasteride (PROSCAR) 5 MG tablet, Take 1 tablet by mouth daily., Disp: 90 tablet, Rfl: 3   Fluticasone-Umeclidin-Vilant (TRELEGY ELLIPTA) 100-62.5-25 MCG/ACT AEPB, Inhale 1 puff into the lungs daily., Disp: 1 each, Rfl: 11   gabapentin (NEURONTIN) 100 MG capsule, Take 2 capsules (200 mg total) by mouth at bedtime., Disp: 180 capsule, Rfl: 0   meloxicam (MOBIC) 15 MG tablet, Take 1 tablet (15 mg total) by mouth daily., Disp: 30 tablet, Rfl: 0   Multiple Vitamins-Minerals (MENS MULTIVITAMIN) TABS, Take by mouth. Take 1 tablet by mouth every day, Disp: , Rfl:    pantoprazole (PROTONIX) 40 MG tablet, Take 1 tablet (40 mg total) by mouth daily. Must keep July appt for future refills, Disp: 90 tablet, Rfl: 0   sildenafil (VIAGRA) 100 MG tablet, TAKE 1 TABLET(100 MG) BY MOUTH DAILY AS NEEDED FOR ERECTILE DYSFUNCTION, Disp: 12 tablet, Rfl: 2   triamcinolone cream (KENALOG) 0.5 %, Apply 1 application topically 3 (three) times daily as needed., Disp: 45 g, Rfl: 2   triamterene-hydrochlorothiazide (MAXZIDE-25) 37.5-25 MG tablet, Take 1 tablet by mouth daily., Disp: 90 tablet, Rfl: 3   zolpidem (AMBIEN) 10 MG tablet, TAKE 1 TABLET(10 MG) BY MOUTH AT BEDTIME AS NEEDED FOR SLEEP, Disp: 30 tablet, Rfl: 0   Objective:     Vitals:   08/21/21 0955  BP: 120/70  Pulse: 73  SpO2: 100%  Weight: 202 lb (91.6 kg)  Height: 6' (1.829 m)      Body mass index is 27.4 kg/m.    Physical Exam:    General: awake, alert, and oriented no acute distress, nontoxic Skin: no suspicious lesions or rashes Neuro:sensation intact distally with no  dificits, normal muscle tone, no atrophy, strength 5/5 in all tested lower ext groups Psych: normal mood and affect, speech clear   Left hip: No deformity or wasting 2+ pitting edema distal to knee with ecchymosis over hamstring, anterior shin, ankle, foot.  Neurovascularly intact ROM Flexion 90, ext 15, IR 45, ER 45 TTP gluteal musculature and proximal hamstring, and globally to lower extremity distal to knee NTTP over the hip flexors, greater troch,   si joint, lumbar spine Negative log roll with FROM Negative FABER Positive FADIR Negative Piriformis test   Gait normal    Electronically signed by:  Benito Mccreedy D.[facility] 11:15 AM 08/21/21

## 2021-08-21 NOTE — Patient Instructions (Addendum)
Good to see you  Tylenol [phone] mg 2-3 times a day for pain relief  Start tramadol '50mg'$  3 times a day as needed Elevate leg above heart as much as possible during the day  Xrays on the way out  2 week follow up

## 2021-08-29 ENCOUNTER — Ambulatory Visit: Payer: BC Managed Care – PPO | Admitting: Sports Medicine

## 2021-08-31 NOTE — Progress Notes (Unsigned)
    Paul [name] D.[place] Phone: [phone]   Assessment and Plan:     There are no diagnoses linked to this encounter.  ***   Pertinent previous records reviewed include ***   Follow Up: ***     Subjective:   I, Paul [name], am serving as a Education administrator for Doctor Paul [name]   Chief Complaint: left foot pain    HPI:    08/14/21 Patient is a 76 year old male complaining of left foot pain. Patient states that he noticed the bruising on Friday , slight pain its just all the swelling that feels like its stretching his skin out, is TTP to the whole leg and shin area   08/21/2021 Patient states that he is still in pain, isn't able to sleep more than 4 hours   09/01/2021 Patient states    Relevant Historical Information: Hypertension, GERD  Additional pertinent review of systems negative.   Current Outpatient Medications:    acetaminophen-codeine (TYLENOL #4) 300-60 MG tablet, TAKE 1 TABLET BY MOUTH EVERY 12 HOURS AS NEEDED FOR PAIN, Disp: 60 tablet, Rfl: 0   atorvastatin (LIPITOR) 10 MG tablet, Take 1 tablet by mouth daily., Disp: 90 tablet, Rfl: 3   buPROPion (WELLBUTRIN XL) 300 MG 24 hr tablet, Take 1 tablet by mouth daily., Disp: 90 tablet, Rfl: 0   ciclopirox (PENLAC) 8 % solution, Apply topically at bedtime. Apply over nail and surrounding skin daily, Disp: 6.6 mL, Rfl: 1   fenofibrate (TRICOR) 145 MG tablet, TAKE 1 TABLET BY MOUTH EVERY DAY, Disp: 30 tablet, Rfl: 3   finasteride (PROSCAR) 5 MG tablet, Take 1 tablet by mouth daily., Disp: 90 tablet, Rfl: 3   Fluticasone-Umeclidin-Vilant (TRELEGY ELLIPTA) 100-62.5-25 MCG/ACT AEPB, Inhale 1 puff into the lungs daily., Disp: 1 each, Rfl: 11   gabapentin (NEURONTIN) 100 MG capsule, Take 2 capsules (200 mg total) by mouth at bedtime., Disp: 180 capsule, Rfl: 0   meloxicam (MOBIC) 15 MG tablet, Take 1 tablet (15 mg total) by mouth daily., Disp: 30  tablet, Rfl: 0   Multiple Vitamins-Minerals (MENS MULTIVITAMIN) TABS, Take by mouth. Take 1 tablet by mouth every day, Disp: , Rfl:    pantoprazole (PROTONIX) 40 MG tablet, Take 1 tablet (40 mg total) by mouth daily. Must keep July appt for future refills, Disp: 90 tablet, Rfl: 0   sildenafil (VIAGRA) 100 MG tablet, TAKE 1 TABLET(100 MG) BY MOUTH DAILY AS NEEDED FOR ERECTILE DYSFUNCTION, Disp: 12 tablet, Rfl: 2   triamcinolone cream (KENALOG) 0.5 %, Apply 1 application topically 3 (three) times daily as needed., Disp: 45 g, Rfl: 2   triamterene-hydrochlorothiazide (MAXZIDE-25) 37.5-25 MG tablet, Take 1 tablet by mouth daily., Disp: 90 tablet, Rfl: 3   zolpidem (AMBIEN) 10 MG tablet, TAKE 1 TABLET(10 MG) BY MOUTH AT BEDTIME AS NEEDED FOR SLEEP, Disp: 30 tablet, Rfl: 0   Objective:     There were no vitals filed for this visit.    There is no height or weight on file to calculate BMI.    Physical Exam:    ***   Electronically signed by:  Paul [name] D.[facility] 2:22 PM 08/31/21

## 2021-09-01 ENCOUNTER — Ambulatory Visit: Payer: Self-pay

## 2021-09-01 ENCOUNTER — Ambulatory Visit: Payer: BC Managed Care – PPO | Admitting: Sports Medicine

## 2021-09-01 VITALS — BP 128/80 | HR 78 | Ht 72.0 in | Wt 197.0 lb

## 2021-09-01 DIAGNOSIS — M79605 Pain in left leg: Secondary | ICD-10-CM

## 2021-09-01 DIAGNOSIS — S76312D Strain of muscle, fascia and tendon of the posterior muscle group at thigh level, left thigh, subsequent encounter: Secondary | ICD-10-CM | POA: Diagnosis not present

## 2021-09-07 ENCOUNTER — Other Ambulatory Visit: Payer: Self-pay | Admitting: Internal Medicine

## 2021-09-08 ENCOUNTER — Other Ambulatory Visit: Payer: Self-pay | Admitting: Internal Medicine

## 2021-09-08 ENCOUNTER — Encounter: Payer: Self-pay | Admitting: Internal Medicine

## 2021-09-08 DIAGNOSIS — S76312D Strain of muscle, fascia and tendon of the posterior muscle group at thigh level, left thigh, subsequent encounter: Secondary | ICD-10-CM | POA: Diagnosis not present

## 2021-09-11 DIAGNOSIS — S76312D Strain of muscle, fascia and tendon of the posterior muscle group at thigh level, left thigh, subsequent encounter: Secondary | ICD-10-CM | POA: Diagnosis not present

## 2021-09-13 ENCOUNTER — Other Ambulatory Visit: Payer: Self-pay | Admitting: Internal Medicine

## 2021-09-13 DIAGNOSIS — S76312D Strain of muscle, fascia and tendon of the posterior muscle group at thigh level, left thigh, subsequent encounter: Secondary | ICD-10-CM | POA: Diagnosis not present

## 2021-09-13 MED ORDER — ACETAMINOPHEN-CODEINE 300-60 MG PO TABS: 1.0000 | ORAL_TABLET | Freq: Two times a day (BID) | ORAL | 1 refills | Status: DC | PRN

## 2021-09-21 ENCOUNTER — Ambulatory Visit: Payer: BC Managed Care – PPO | Admitting: Internal Medicine

## 2021-09-22 ENCOUNTER — Encounter: Payer: Self-pay | Admitting: Internal Medicine

## 2021-09-22 ENCOUNTER — Ambulatory Visit: Payer: BC Managed Care – PPO | Admitting: Internal Medicine

## 2021-09-22 DIAGNOSIS — I2510 Atherosclerotic heart disease of native coronary artery without angina pectoris: Secondary | ICD-10-CM | POA: Diagnosis not present

## 2021-09-22 DIAGNOSIS — F419 Anxiety disorder, unspecified: Secondary | ICD-10-CM | POA: Diagnosis not present

## 2021-09-22 DIAGNOSIS — I10 Essential (primary) hypertension: Secondary | ICD-10-CM

## 2021-09-22 DIAGNOSIS — S76312D Strain of muscle, fascia and tendon of the posterior muscle group at thigh level, left thigh, subsequent encounter: Secondary | ICD-10-CM | POA: Diagnosis not present

## 2021-09-22 DIAGNOSIS — G8929 Other chronic pain: Secondary | ICD-10-CM

## 2021-09-22 DIAGNOSIS — G4700 Insomnia, unspecified: Secondary | ICD-10-CM | POA: Diagnosis not present

## 2021-09-22 DIAGNOSIS — I2583 Coronary atherosclerosis due to lipid rich plaque: Secondary | ICD-10-CM

## 2021-09-22 DIAGNOSIS — M5442 Lumbago with sciatica, left side: Secondary | ICD-10-CM

## 2021-09-22 DIAGNOSIS — M5441 Lumbago with sciatica, right side: Secondary | ICD-10-CM

## 2021-09-22 NOTE — Assessment & Plan Note (Signed)
On Lipitor 

## 2021-09-22 NOTE — Progress Notes (Signed)
Subjective:  Patient ID: Paul [name]., male    DOB: [birth year]  Age: 76 y.o. MRN: [id number]  CC: Follow-up (3 month follow up)   HPI Paul [name]. presents for R leg injury in May - he fell and ruptured a muscle in the L leg - seeing Dr Glennon Mac F/u OA/LBP, HTN, insomnia  Outpatient Medications Prior to Visit  Medication Sig Dispense Refill   acetaminophen-codeine (TYLENOL #4) 300-60 MG tablet Take 1 tablet by mouth every 12 (twelve) hours as needed. for pain 60 tablet 1   atorvastatin (LIPITOR) 10 MG tablet Take 1 tablet by mouth daily. 90 tablet 3   buPROPion (WELLBUTRIN XL) 300 MG 24 hr tablet Take 1 tablet by mouth daily. 90 tablet 1   ciclopirox (PENLAC) 8 % solution Apply topically at bedtime. Apply over nail and surrounding skin daily 6.6 mL 1   fenofibrate (TRICOR) 145 MG tablet TAKE 1 TABLET BY MOUTH EVERY DAY 30 tablet 3   finasteride (PROSCAR) 5 MG tablet Take 1 tablet by mouth daily. 90 tablet 3   Fluticasone-Umeclidin-Vilant (TRELEGY ELLIPTA) 100-62.5-25 MCG/ACT AEPB Inhale 1 puff into the lungs daily. 1 each 11   gabapentin (NEURONTIN) 100 MG capsule Take 2 capsules (200 mg total) by mouth at bedtime. 180 capsule 0   meloxicam (MOBIC) 15 MG tablet Take 1 tablet (15 mg total) by mouth daily. 30 tablet 0   Multiple Vitamins-Minerals (MENS MULTIVITAMIN) TABS Take by mouth. Take 1 tablet by mouth every day     pantoprazole (PROTONIX) 40 MG tablet Take 1 tablet (40 mg total) by mouth daily. Must keep July appt for future refills 90 tablet 0   sildenafil (VIAGRA) 100 MG tablet TAKE 1 TABLET(100 MG) BY MOUTH DAILY AS NEEDED FOR ERECTILE DYSFUNCTION 12 tablet 2   triamcinolone cream (KENALOG) 0.5 % Apply 1 application topically 3 (three) times daily as needed. 45 g 2   zolpidem (AMBIEN) 10 MG tablet TAKE 1 TABLET(10 MG) BY MOUTH AT BEDTIME AS NEEDED FOR SLEEP 30 tablet 0   triamterene-hydrochlorothiazide (MAXZIDE-25) 37.5-25 MG tablet Take 1 tablet by mouth daily.  (Patient not taking: Reported on 09/22/2021) 90 tablet 3   No facility-administered medications prior to visit.    ROS: Review of Systems  Constitutional:  Negative for appetite change, fatigue and unexpected weight change.  HENT:  Negative for congestion, nosebleeds, sneezing, sore throat and trouble swallowing.   Eyes:  Negative for itching and visual disturbance.  Respiratory:  Negative for cough.   Cardiovascular:  Negative for chest pain, palpitations and leg swelling.  Gastrointestinal:  Negative for abdominal distention, blood in stool, diarrhea and nausea.  Genitourinary:  Negative for frequency and hematuria.  Musculoskeletal:  Positive for arthralgias and back pain. Negative for gait problem, joint swelling and neck pain.  Skin:  Negative for rash.  Neurological:  Negative for dizziness, tremors, speech difficulty and weakness.  Psychiatric/Behavioral:  Positive for decreased concentration. Negative for agitation, dysphoric mood, sleep disturbance and suicidal ideas. The patient is not nervous/anxious.     Objective:  BP 124/70 (BP Location: Left Arm, Patient Position: Sitting, Cuff Size: Large)   Pulse 72   Temp 98 F (36.7 C) (Oral)   Ht 6' (1.829 m)   Wt 202 lb 6.4 oz (91.8 kg)   SpO2 97%   BMI 27.45 kg/m   BP Readings from Last 3 Encounters:  09/22/21 124/70  09/01/21 128/80  08/21/21 120/70    Wt Readings from Last 3 Encounters:  09/22/21 202 lb 6.4 oz (91.8 kg)  09/01/21 197 lb (89.4 kg)  08/21/21 202 lb (91.6 kg)    Physical Exam Constitutional:      General: He is not in acute distress.    Appearance: He is well-developed.     Comments: NAD  Eyes:     Conjunctiva/sclera: Conjunctivae normal.     Pupils: Pupils are equal, round, and reactive to light.  Neck:     Thyroid: No thyromegaly.     Vascular: No JVD.  Cardiovascular:     Rate and Rhythm: Normal rate and regular rhythm.     Heart sounds: Normal heart sounds. No murmur heard.    No  friction rub. No gallop.  Pulmonary:     Effort: Pulmonary effort is normal. No respiratory distress.     Breath sounds: Normal breath sounds. No wheezing or rales.  Chest:     Chest wall: No tenderness.  Abdominal:     General: Bowel sounds are normal. There is no distension.     Palpations: Abdomen is soft. There is no mass.     Tenderness: There is no abdominal tenderness. There is no guarding or rebound.  Musculoskeletal:        General: No tenderness. Normal range of motion.     Cervical back: Normal range of motion.  Lymphadenopathy:     Cervical: No cervical adenopathy.  Skin:    General: Skin is warm and dry.     Findings: No rash.  Neurological:     Mental Status: He is alert and oriented to person, place, and time.     Cranial Nerves: No cranial nerve deficit.     Motor: No abnormal muscle tone.     Coordination: Coordination normal.     Gait: Gait normal.     Deep Tendon Reflexes: Reflexes are normal and symmetric.  Psychiatric:        Behavior: Behavior normal.        Thought Content: Thought content normal.        Judgment: Judgment normal.   L thigh is sensitive  Lab Results  Component Value Date   WBC 7.4 11/10/2020   HGB 15.3 11/10/2020   HCT 46.4 11/10/2020   PLT 260.0 11/10/2020   GLUCOSE 95 11/10/2020   CHOL 186 05/30/2020   TRIG 209.0 (H) 05/30/2020   HDL 51.30 05/30/2020   LDLDIRECT 96.0 05/30/2020   LDLCALC 98 01/04/2014   ALT 19 11/10/2020   AST 30 11/10/2020   NA 137 11/10/2020   K 4.4 11/10/2020   CL 101 11/10/2020   CREATININE 0.97 11/10/2020   BUN 21 11/10/2020   CO2 30 11/10/2020   TSH 2.73 09/05/2020   PSA 0.17 11/10/2020    ECHOCARDIOGRAM COMPLETE  Result Date: 03/09/2019   ECHOCARDIOGRAM REPORT   Patient Name:   Lilton Pare. Date of Exam: 03/09/2019 Medical Rec #:  [id number]            Height:       72.0 in Accession #:    [id number]           Weight:       198.0 lb Date of Birth:  June 29, 1945            BSA:           2.12 m Patient Age:    10 years             BP:  126/70 mmHg Patient Gender: M                    HR:           65 bpm. Exam Location:  [address] Procedure: 2D Echo, Cardiac Doppler and Color Doppler Indications:    R55 Syncope  History:        Patient has no prior history of Echocardiogram examinations.                 Risk Factors:Hypertension and Dyslipidemia.  Sonographer:    Wilford Sports Rodgers-Jones RDCS Referring Phys: Morris  1. Left ventricular ejection fraction, by visual estimation, is 65 to 70%. The left ventricle has hyperdynamic function. There is no left ventricular hypertrophy.  2. Left ventricular diastolic parameters are consistent with Grade I diastolic dysfunction (impaired relaxation).  3. The left ventricle has no regional wall motion abnormalities.  4. Global right ventricle has normal systolic function.The right ventricular size is normal. No increase in right ventricular wall thickness.  5. Left atrial size was normal.  6. Right atrial size was normal.  7. The mitral valve is normal in structure. Mild mitral valve regurgitation. No evidence of mitral stenosis.  8. The tricuspid valve is normal in structure.  9. The aortic valve is normal in structure. Aortic valve regurgitation is not visualized. No evidence of aortic valve sclerosis or stenosis. 10. The pulmonic valve was normal in structure. Pulmonic valve regurgitation is trivial. 11. Normal pulmonary artery systolic pressure. 12. The tricuspid regurgitant velocity is 2.26 m/s, and with an assumed right atrial pressure of 3 mmHg, the estimated right ventricular systolic pressure is normal at 23.4 mmHg. 13. The inferior vena cava is normal in size with greater than 50% respiratory variability, suggesting right atrial pressure of 3 mmHg. FINDINGS  Left Ventricle: Left ventricular ejection fraction, by visual estimation, is 65 to 70%. The left ventricle has hyperdynamic function. The left ventricle  has no regional wall motion abnormalities. There is no left ventricular hypertrophy. Left ventricular diastolic parameters are consistent with Grade I diastolic dysfunction (impaired relaxation). Normal left atrial pressure. Right Ventricle: The right ventricular size is normal. No increase in right ventricular wall thickness. Global RV systolic function is has normal systolic function. The tricuspid regurgitant velocity is 2.26 m/s, and with an assumed right atrial pressure  of 3 mmHg, the estimated right ventricular systolic pressure is normal at 23.4 mmHg. Left Atrium: Left atrial size was normal in size. Right Atrium: Right atrial size was normal in size Pericardium: There is no evidence of pericardial effusion. Mitral Valve: The mitral valve is normal in structure. Mild mitral valve regurgitation. No evidence of mitral valve stenosis by observation. Tricuspid Valve: The tricuspid valve is normal in structure. Tricuspid valve regurgitation is mild. Aortic Valve: The aortic valve is normal in structure. Aortic valve regurgitation is not visualized. The aortic valve is structurally normal, with no evidence of sclerosis or stenosis. Pulmonic Valve: The pulmonic valve was normal in structure. Pulmonic valve regurgitation is trivial. Pulmonic regurgitation is trivial. Aorta: The aortic root, ascending aorta and aortic arch are all structurally normal, with no evidence of dilitation or obstruction. Venous: The inferior vena cava is normal in size with greater than 50% respiratory variability, suggesting right atrial pressure of 3 mmHg. IAS/Shunts: No atrial level shunt detected by color flow Doppler. There is no evidence of a patent foramen ovale. No ventricular septal defect is seen or detected. There is no  evidence of an atrial septal defect.  LEFT VENTRICLE PLAX 2D LVIDd:         4.10 cm  Diastology LVIDs:         2.60 cm  LV e' lateral:   8.59 cm/s LV PW:         0.90 cm  LV E/e' lateral: 8.2 LV IVS:        1.10  cm  LV e' medial:    7.72 cm/s LVOT diam:     2.20 cm  LV E/e' medial:  9.1 LV SV:         50 ml LV SV Index:   23.09 LVOT Area:     3.80 cm  RIGHT VENTRICLE RV Basal diam:  3.70 cm RV S prime:     16.60 cm/s TAPSE (M-mode): 2.3 cm LEFT ATRIUM             Index       RIGHT ATRIUM           Index LA diam:        3.90 cm 1.84 cm/m  RA Area:     12.50 cm LA Vol (A2C):   57.9 ml 27.29 ml/m RA Volume:   29.20 ml  13.76 ml/m LA Vol (A4C):   32.3 ml 15.23 ml/m LA Biplane Vol: 46.6 ml 21.97 ml/m  AORTIC VALVE LVOT Vmax:   92.85 cm/s LVOT Vmean:  66.600 cm/s LVOT VTI:    0.208 m  AORTA Ao Root diam: 4.20 cm Ao Asc diam:  3.90 cm MITRAL VALVE                        TRICUSPID VALVE MV Area (PHT): 4.21 cm             TR Peak grad:   20.4 mmHg MV PHT:        52.20 msec           TR Vmax:        226.00 cm/s MV Decel Time: 180 msec MV E velocity: 70.30 cm/s 103 cm/s  SHUNTS MV A velocity: 73.30 cm/s 70.3 cm/s Systemic VTI:  0.21 m MV E/A ratio:  0.96       1.5       Systemic Diam: 2.20 cm  Ena Dawley MD Electronically signed by Ena Dawley MD Signature Date/Time: 03/09/2019/4:18:25 PM    Final    VAS US CAROTID  Result Date: 03/09/2019 Carotid Arterial Duplex Study Indications:       Patient reports intermittent dizziness x 1 year and one                    presyncope episode occurring a couple of weeks ago. He                    relates his symptoms to blood pressure medication. He states                    since his prescription has be halved he has not experienced                    any of the symptoms. He denies any other cerebrovascular                    symptoms. Risk Factors:      Hypertension, hyperlipidemia, past history of smoking. Comparison Study:  NA Performing Technologist: Sharlett Iles  RVT  Examination Guidelines: A complete evaluation includes B-mode imaging, spectral Doppler, color Doppler, and power Doppler as needed of all accessible portions of each vessel. Bilateral testing is  considered an integral part of a complete examination. Limited examinations for reoccurring indications may be performed as noted.  Right Carotid Findings: +----------+--------+--------+--------+------------------+--------+           PSV cm/sEDV cm/sStenosisPlaque DescriptionComments +----------+--------+--------+--------+------------------+--------+ CCA Prox  126     14                                         +----------+--------+--------+--------+------------------+--------+ CCA Distal94      11                                         +----------+--------+--------+--------+------------------+--------+ ICA Prox  84      15                                         +----------+--------+--------+--------+------------------+--------+ ICA Mid   74      17                                         +----------+--------+--------+--------+------------------+--------+ ICA Distal79      21                                         +----------+--------+--------+--------+------------------+--------+ ECA       132     9                                          +----------+--------+--------+--------+------------------+--------+ +----------+--------+-------+----------------+-------------------+           PSV cm/sEDV cmsDescribe        Arm Pressure (mmHG) +----------+--------+-------+----------------+-------------------+ [id number]            Multiphasic, [id number]                 +----------+--------+-------+----------------+-------------------+ +---------+--------+--+--------+-+---------+ VertebralPSV cm/s36EDV cm/s7Antegrade +---------+--------+--+--------+-+---------+  Left Carotid Findings: +----------+--------+--------+--------+------------------+------------------+           PSV cm/sEDV cm/sStenosisPlaque DescriptionComments           +----------+--------+--------+--------+------------------+------------------+ CCA Prox  118     14                                                    +----------+--------+--------+--------+------------------+------------------+ CCA Distal88      16                                                   +----------+--------+--------+--------+------------------+------------------+ ICA Prox  81      18  1-39%   heterogenous      intimal thickening +----------+--------+--------+--------+------------------+------------------+ ICA Mid   68      22                                                   +----------+--------+--------+--------+------------------+------------------+ ICA Distal85      29                                                   +----------+--------+--------+--------+------------------+------------------+ ECA       90      9               heterogenous                         +----------+--------+--------+--------+------------------+------------------+ +----------+--------+--------+----------------+-------------------+           PSV cm/sEDV cm/sDescribe        Arm Pressure (mmHG) +----------+--------+--------+----------------+-------------------+ [id number]             Multiphasic, [id number]                 +----------+--------+--------+----------------+-------------------+ +---------+--------+--+--------+-+---------+ VertebralPSV cm/s34EDV cm/s7Antegrade +---------+--------+--+--------+-+---------+  Summary: Right Carotid: There was no evidence of thrombus, dissection, atherosclerotic                plaque or stenosis in the cervical carotid system. Left Carotid: Velocities in the left ICA are consistent with a 1-39% stenosis. Vertebrals:  Bilateral vertebral arteries demonstrate antegrade flow. Subclavians: Normal flow hemodynamics were seen in bilateral subclavian              arteries. *See table(s) above for measurements and observations.  Electronically signed by Jenkins Rouge MD on 03/09/2019 at 11:40:33 AM.    Final     Assessment & Plan:   Problem List Items  Addressed This Visit     Anxiety disorder    On Wellbutrin XL Lion's mane      Chronic low back pain    Blue-Emu cream was recommended to use 2-3 times a day T#4 prn  Potential benefits of a long term opioids use as well as potential risks (i.e. addiction risk, apnea etc) and complications (i.e. Somnolence, constipation and others) were explained to the patient and were aknowledged.       Coronary atherosclerosis    On Lipitor      Hypertension    On [name], Maxzide      INSOMNIA, CHRONIC    Zolpidem prn  Potential benefits of a long term benzodiazepines  use as well as potential risks  and complications were explained to the patient and were aknowledged.         No orders of the defined types were placed in this encounter.     Follow-up: Return in about 3 months (around 12/23/2021) for a follow-up visit.  Walker Kehr, MD

## 2021-09-22 NOTE — Assessment & Plan Note (Signed)
Blue-Emu cream was recommended to use 2-3 times a day T#4 prn  Potential benefits of a long term opioids use as well as potential risks (i.e. addiction risk, apnea etc) and complications (i.e. Somnolence, constipation and others) were explained to the patient and were aknowledged.

## 2021-09-22 NOTE — Assessment & Plan Note (Signed)
On [name], Maxzide

## 2021-09-22 NOTE — Assessment & Plan Note (Signed)
Zolpidem prn  Potential benefits of a long term benzodiazepines  use as well as potential risks  and complications were explained to the patient and were aknowledged. 

## 2021-09-22 NOTE — Assessment & Plan Note (Signed)
On Wellbutrin XL Lion's mane

## 2021-09-25 ENCOUNTER — Other Ambulatory Visit: Payer: Self-pay | Admitting: Internal Medicine

## 2021-09-25 DIAGNOSIS — S76312D Strain of muscle, fascia and tendon of the posterior muscle group at thigh level, left thigh, subsequent encounter: Secondary | ICD-10-CM | POA: Diagnosis not present

## 2021-09-28 NOTE — Progress Notes (Deleted)
Paul [name] D.[place] Phone: [phone]   Assessment and Plan:     There are no diagnoses linked to this encounter.  ***   Pertinent previous records reviewed include ***   Follow Up: ***     Subjective:   I, Paul [name], am serving as a Education administrator for Paul [name]   Chief Complaint: left foot pain    HPI:    08/14/21 Patient is a 76 year old male complaining of left foot pain. Patient states that he noticed the bruising on Friday , slight pain its just all the swelling that feels like its stretching his skin out, is TTP to the whole leg and shin area   08/21/2021 Patient states that he is still in pain, isn't able to sleep more than 4 hours    09/01/2021 Patient states that he is pretty good has some knots in his leg and it seems to be consistent for the last week after the drainage he had relief but now he has some pain again , is able to walk better now,  but he has to apply some pressure to his hamstring he is now getting right side low back pain from altered gait , has knots all through his leg , uncomfortable to sit because it feels like he is sitting on a golf ball   09/29/2021 Patient states      Relevant Historical Information: Hypertension, GERD  Additional pertinent review of systems negative.   Current Outpatient Medications:    acetaminophen-codeine (TYLENOL #4) 300-60 MG tablet, Take 1 tablet by mouth every 12 (twelve) hours as needed. for pain, Disp: 60 tablet, Rfl: 1   atorvastatin (LIPITOR) 10 MG tablet, Take 1 tablet by mouth daily., Disp: 90 tablet, Rfl: 3   buPROPion (WELLBUTRIN XL) 300 MG 24 hr tablet, Take 1 tablet by mouth daily., Disp: 90 tablet, Rfl: 1   ciclopirox (PENLAC) 8 % solution, Apply topically at bedtime. Apply over nail and surrounding skin daily, Disp: 6.6 mL, Rfl: 1   fenofibrate (TRICOR) 145 MG tablet, TAKE 1 TABLET BY MOUTH EVERY DAY, Disp:  30 tablet, Rfl: 3   finasteride (PROSCAR) 5 MG tablet, Take 1 tablet by mouth daily., Disp: 90 tablet, Rfl: 3   Fluticasone-Umeclidin-Vilant (TRELEGY ELLIPTA) 100-62.5-25 MCG/ACT AEPB, Inhale 1 puff into the lungs daily., Disp: 1 each, Rfl: 11   gabapentin (NEURONTIN) 100 MG capsule, Take 2 capsules (200 mg total) by mouth at bedtime., Disp: 180 capsule, Rfl: 0   meloxicam (MOBIC) 15 MG tablet, Take 1 tablet (15 mg total) by mouth daily., Disp: 30 tablet, Rfl: 0   Multiple Vitamins-Minerals (MENS MULTIVITAMIN) TABS, Take by mouth. Take 1 tablet by mouth every day, Disp: , Rfl:    pantoprazole (PROTONIX) 40 MG tablet, Take 1 tablet (40 mg total) by mouth daily., Disp: 90 tablet, Rfl: 3   sildenafil (VIAGRA) 100 MG tablet, TAKE 1 TABLET(100 MG) BY MOUTH DAILY AS NEEDED FOR ERECTILE DYSFUNCTION, Disp: 12 tablet, Rfl: 2   triamcinolone cream (KENALOG) 0.5 %, Apply 1 application topically 3 (three) times daily as needed., Disp: 45 g, Rfl: 2   triamterene-hydrochlorothiazide (MAXZIDE-25) 37.5-25 MG tablet, Take 1 tablet by mouth daily. (Patient not taking: Reported on 09/22/2021), Disp: 90 tablet, Rfl: 3   zolpidem (AMBIEN) 10 MG tablet, TAKE 1 TABLET(10 MG) BY MOUTH AT BEDTIME AS NEEDED FOR SLEEP, Disp: 30 tablet, Rfl: 0   Objective:  There were no vitals filed for this visit.    There is no height or weight on file to calculate BMI.    Physical Exam:    ***   Electronically signed by:  Paul [name] D.[name] 7:45 AM 09/28/21

## 2021-09-29 ENCOUNTER — Ambulatory Visit: Payer: BC Managed Care – PPO | Admitting: Sports Medicine

## 2021-10-02 DIAGNOSIS — S76312D Strain of muscle, fascia and tendon of the posterior muscle group at thigh level, left thigh, subsequent encounter: Secondary | ICD-10-CM | POA: Diagnosis not present

## 2021-10-04 DIAGNOSIS — S76312D Strain of muscle, fascia and tendon of the posterior muscle group at thigh level, left thigh, subsequent encounter: Secondary | ICD-10-CM | POA: Diagnosis not present

## 2021-10-19 DIAGNOSIS — B359 Dermatophytosis, unspecified: Secondary | ICD-10-CM | POA: Diagnosis not present

## 2021-10-19 DIAGNOSIS — L578 Other skin changes due to chronic exposure to nonionizing radiation: Secondary | ICD-10-CM | POA: Diagnosis not present

## 2021-10-19 DIAGNOSIS — D225 Melanocytic nevi of trunk: Secondary | ICD-10-CM | POA: Diagnosis not present

## 2021-10-19 DIAGNOSIS — L570 Actinic keratosis: Secondary | ICD-10-CM | POA: Diagnosis not present

## 2021-10-19 DIAGNOSIS — B351 Tinea unguium: Secondary | ICD-10-CM | POA: Diagnosis not present

## 2021-11-07 ENCOUNTER — Other Ambulatory Visit: Payer: Self-pay | Admitting: Internal Medicine

## 2021-11-08 NOTE — Telephone Encounter (Signed)
Check [place] registry last filled 10/13/2021.Marland Kitchen/LMB

## 2021-11-10 ENCOUNTER — Encounter: Payer: Self-pay | Admitting: Internal Medicine

## 2021-11-28 ENCOUNTER — Other Ambulatory Visit: Payer: Self-pay | Admitting: Internal Medicine

## 2021-12-11 ENCOUNTER — Encounter: Payer: Self-pay | Admitting: Internal Medicine

## 2021-12-11 ENCOUNTER — Ambulatory Visit (INDEPENDENT_AMBULATORY_CARE_PROVIDER_SITE_OTHER): Payer: BC Managed Care – PPO

## 2021-12-11 ENCOUNTER — Ambulatory Visit: Payer: BC Managed Care – PPO | Admitting: Internal Medicine

## 2021-12-11 VITALS — BP 142/72 | HR 66 | Temp 98.4°F | Ht 72.0 in | Wt 200.0 lb

## 2021-12-11 DIAGNOSIS — R053 Chronic cough: Secondary | ICD-10-CM | POA: Diagnosis not present

## 2021-12-11 DIAGNOSIS — G4700 Insomnia, unspecified: Secondary | ICD-10-CM

## 2021-12-11 DIAGNOSIS — Z0000 Encounter for general adult medical examination without abnormal findings: Secondary | ICD-10-CM

## 2021-12-11 DIAGNOSIS — Z23 Encounter for immunization: Secondary | ICD-10-CM | POA: Diagnosis not present

## 2021-12-11 DIAGNOSIS — I10 Essential (primary) hypertension: Secondary | ICD-10-CM | POA: Diagnosis not present

## 2021-12-11 DIAGNOSIS — J4520 Mild intermittent asthma, uncomplicated: Secondary | ICD-10-CM

## 2021-12-11 DIAGNOSIS — G8929 Other chronic pain: Secondary | ICD-10-CM

## 2021-12-11 DIAGNOSIS — M5442 Lumbago with sciatica, left side: Secondary | ICD-10-CM

## 2021-12-11 DIAGNOSIS — F419 Anxiety disorder, unspecified: Secondary | ICD-10-CM | POA: Diagnosis not present

## 2021-12-11 DIAGNOSIS — M5441 Lumbago with sciatica, right side: Secondary | ICD-10-CM

## 2021-12-11 DIAGNOSIS — K2100 Gastro-esophageal reflux disease with esophagitis, without bleeding: Secondary | ICD-10-CM

## 2021-12-11 MED ORDER — SILDENAFIL CITRATE 100 MG PO TABS
ORAL_TABLET | ORAL | 5 refills | Status: DC
Start: 2021-12-11 — End: 2023-04-16

## 2021-12-11 MED ORDER — TRELEGY ELLIPTA 100-62.5-25 MCG/ACT IN AEPB
1.0000 | INHALATION_SPRAY | Freq: Every day | RESPIRATORY_TRACT | 3 refills | Status: DC
Start: 2021-12-11 — End: 2022-01-08

## 2021-12-11 NOTE — Assessment & Plan Note (Signed)
On Wellbutrin XL

## 2021-12-11 NOTE — Assessment & Plan Note (Signed)
On Protonix  Potential benefits of a long term PPI use as well as potential risks  and complications were explained to the patient and were aknowledged.

## 2021-12-11 NOTE — Progress Notes (Signed)
Subjective:  Patient ID: Paul [name]., male    DOB: 21-Nov-1945  Age: 76 y.o. MRN: [id number]  CC: Follow-up (3 month f/u- flu shot)   HPI [employer]. presents for LBP, HTN, GERD  Outpatient Medications Prior to Visit  Medication Sig Dispense Refill   acetaminophen-codeine (TYLENOL #4) 300-60 MG tablet TAKE 1 TABLET BY MOUTH EVERY 12 HOURS AS NEEDED FOR PAIN 60 tablet 2   atorvastatin (LIPITOR) 10 MG tablet Take 1 tablet by mouth daily. 90 tablet 3   buPROPion (WELLBUTRIN XL) 300 MG 24 hr tablet Take 1 tablet by mouth daily. 90 tablet 1   ciclopirox (PENLAC) 8 % solution Apply topically at bedtime. Apply over nail and surrounding skin daily 6.6 mL 1   fenofibrate (TRICOR) 145 MG tablet TAKE 1 TABLET BY MOUTH EVERY DAY 30 tablet 3   finasteride (PROSCAR) 5 MG tablet Take 1 tablet by mouth daily. 90 tablet 3   gabapentin (NEURONTIN) 100 MG capsule Take 2 capsules (200 mg total) by mouth at bedtime. 180 capsule 0   meloxicam (MOBIC) 15 MG tablet Take 1 tablet (15 mg total) by mouth daily. 30 tablet 0   Multiple Vitamins-Minerals (MENS MULTIVITAMIN) TABS Take by mouth. Take 1 tablet by mouth every day     pantoprazole (PROTONIX) 40 MG tablet Take 1 tablet (40 mg total) by mouth daily. 90 tablet 3   triamcinolone cream (KENALOG) 0.5 % Apply 1 application topically 3 (three) times daily as needed. 45 g 2   triamterene-hydrochlorothiazide (MAXZIDE-25) 37.5-25 MG tablet Take 1 tablet by mouth daily. 90 tablet 3   zolpidem (AMBIEN) 10 MG tablet TAKE 1 TABLET(10 MG) BY MOUTH AT BEDTIME AS NEEDED FOR SLEEP 30 tablet 3   Fluticasone-Umeclidin-Vilant (TRELEGY ELLIPTA) 100-62.5-25 MCG/ACT AEPB Inhale 1 puff into the lungs daily. 1 each 11   sildenafil (VIAGRA) 100 MG tablet TAKE 1 TABLET(100 MG) BY MOUTH DAILY AS NEEDED FOR ERECTILE DYSFUNCTION 12 tablet 2   No facility-administered medications prior to visit.    ROS: Review of Systems  Constitutional:  Negative for appetite  change, fatigue and unexpected weight change.  HENT:  Negative for congestion, nosebleeds, sneezing, sore throat and trouble swallowing.   Eyes:  Negative for itching and visual disturbance.  Respiratory:  Positive for cough.   Cardiovascular:  Negative for chest pain, palpitations and leg swelling.  Gastrointestinal:  Negative for abdominal distention, blood in stool, diarrhea and nausea.  Genitourinary:  Negative for frequency and hematuria.  Musculoskeletal:  Positive for back pain and neck pain. Negative for gait problem and joint swelling.  Skin:  Negative for rash.  Neurological:  Negative for dizziness, tremors, speech difficulty and weakness.  Psychiatric/Behavioral:  Negative for agitation, dysphoric mood and sleep disturbance. The patient is not nervous/anxious.     Objective:  BP (!) 142/72 (BP Location: Left Arm)   Pulse 66   Temp 98.4 F (36.9 C) (Oral)   Ht 6' (1.829 m)   Wt 200 lb (90.7 kg)   SpO2 97%   BMI 27.12 kg/m   BP Readings from Last 3 Encounters:  12/11/21 (!) 142/72  09/22/21 124/70  09/01/21 128/80    Wt Readings from Last 3 Encounters:  12/11/21 200 lb (90.7 kg)  09/22/21 202 lb 6.4 oz (91.8 kg)  09/01/21 197 lb (89.4 kg)    Physical Exam Constitutional:      General: He is not in acute distress.    Appearance: Normal appearance. He is well-developed.  Comments: NAD  Eyes:     Conjunctiva/sclera: Conjunctivae normal.     Pupils: Pupils are equal, round, and reactive to light.  Neck:     Thyroid: No thyromegaly.     Vascular: No JVD.  Cardiovascular:     Rate and Rhythm: Normal rate and regular rhythm.     Heart sounds: Normal heart sounds. No murmur heard.    No friction rub. No gallop.  Pulmonary:     Effort: Pulmonary effort is normal. No respiratory distress.     Breath sounds: Normal breath sounds. No wheezing or rales.  Chest:     Chest wall: No tenderness.  Abdominal:     General: Bowel sounds are normal. There is no  distension.     Palpations: Abdomen is soft. There is no mass.     Tenderness: There is no abdominal tenderness. There is no guarding or rebound.  Musculoskeletal:        General: Tenderness present. Normal range of motion.     Cervical back: Normal range of motion.  Lymphadenopathy:     Cervical: No cervical adenopathy.  Skin:    General: Skin is warm and dry.     Findings: No rash.  Neurological:     Mental Status: He is alert and oriented to person, place, and time.     Cranial Nerves: No cranial nerve deficit.     Motor: No abnormal muscle tone.     Coordination: Coordination normal.     Gait: Gait normal.     Deep Tendon Reflexes: Reflexes are normal and symmetric.  Psychiatric:        Behavior: Behavior normal.        Thought Content: Thought content normal.        Judgment: Judgment normal.   Stiff neck  Lab Results  Component Value Date   WBC 7.4 11/10/2020   HGB 15.3 11/10/2020   HCT 46.4 11/10/2020   PLT 260.0 11/10/2020   GLUCOSE 95 11/10/2020   CHOL 186 05/30/2020   TRIG 209.0 (H) 05/30/2020   HDL 51.30 05/30/2020   LDLDIRECT 96.0 05/30/2020   LDLCALC 98 01/04/2014   ALT 19 11/10/2020   AST 30 11/10/2020   NA 137 11/10/2020   K 4.4 11/10/2020   CL 101 11/10/2020   CREATININE 0.97 11/10/2020   BUN 21 11/10/2020   CO2 30 11/10/2020   TSH 2.73 09/05/2020   PSA 0.17 11/10/2020    ECHOCARDIOGRAM COMPLETE  Result Date: 03/09/2019   ECHOCARDIOGRAM REPORT   Patient Name:   Dj Senteno. Date of Exam: 03/09/2019 Medical Rec #:  [id number]            Height:       72.0 in Accession #:    [id number]           Weight:       198.0 lb Date of Birth:  February 28, 1946            BSA:          2.12 m Patient Age:    10 years             BP:           126/70 mmHg Patient Gender: M                    HR:           65 bpm. Exam Location:  [facility] Procedure: 2D Echo, Cardiac Doppler  and Color Doppler Indications:    R55 Syncope  History:        Patient has no prior  history of Echocardiogram examinations.                 Risk Factors:Hypertension and Dyslipidemia.  Sonographer:    Wilford Sports Rodgers-Jones RDCS Referring Phys: [place]  1. Left ventricular ejection fraction, by visual estimation, is 65 to 70%. The left ventricle has hyperdynamic function. There is no left ventricular hypertrophy.  2. Left ventricular diastolic parameters are consistent with Grade I diastolic dysfunction (impaired relaxation).  3. The left ventricle has no regional wall motion abnormalities.  4. Global right ventricle has normal systolic function.The right ventricular size is normal. No increase in right ventricular wall thickness.  5. Left atrial size was normal.  6. Right atrial size was normal.  7. The mitral valve is normal in structure. Mild mitral valve regurgitation. No evidence of mitral stenosis.  8. The tricuspid valve is normal in structure.  9. The aortic valve is normal in structure. Aortic valve regurgitation is not visualized. No evidence of aortic valve sclerosis or stenosis. 10. The pulmonic valve was normal in structure. Pulmonic valve regurgitation is trivial. 11. Normal pulmonary artery systolic pressure. 12. The tricuspid regurgitant velocity is 2.26 m/s, and with an assumed right atrial pressure of 3 mmHg, the estimated right ventricular systolic pressure is normal at 23.4 mmHg. 13. The inferior vena cava is normal in size with greater than 50% respiratory variability, suggesting right atrial pressure of 3 mmHg. FINDINGS  Left Ventricle: Left ventricular ejection fraction, by visual estimation, is 65 to 70%. The left ventricle has hyperdynamic function. The left ventricle has no regional wall motion abnormalities. There is no left ventricular hypertrophy. Left ventricular diastolic parameters are consistent with Grade I diastolic dysfunction (impaired relaxation). Normal left atrial pressure. Right Ventricle: The right ventricular size is normal. No  increase in right ventricular wall thickness. Global RV systolic function is has normal systolic function. The tricuspid regurgitant velocity is 2.26 m/s, and with an assumed right atrial pressure  of 3 mmHg, the estimated right ventricular systolic pressure is normal at 23.4 mmHg. Left Atrium: Left atrial size was normal in size. Right Atrium: Right atrial size was normal in size Pericardium: There is no evidence of pericardial effusion. Mitral Valve: The mitral valve is normal in structure. Mild mitral valve regurgitation. No evidence of mitral valve stenosis by observation. Tricuspid Valve: The tricuspid valve is normal in structure. Tricuspid valve regurgitation is mild. Aortic Valve: The aortic valve is normal in structure. Aortic valve regurgitation is not visualized. The aortic valve is structurally normal, with no evidence of sclerosis or stenosis. Pulmonic Valve: The pulmonic valve was normal in structure. Pulmonic valve regurgitation is trivial. Pulmonic regurgitation is trivial. Aorta: The aortic root, ascending aorta and aortic arch are all structurally normal, with no evidence of dilitation or obstruction. Venous: The inferior vena cava is normal in size with greater than 50% respiratory variability, suggesting right atrial pressure of 3 mmHg. IAS/Shunts: No atrial level shunt detected by color flow Doppler. There is no evidence of a patent foramen ovale. No ventricular septal defect is seen or detected. There is no evidence of an atrial septal defect.  LEFT VENTRICLE PLAX 2D LVIDd:         4.10 cm  Diastology LVIDs:         2.60 cm  LV e' lateral:   8.59 cm/s LV PW:  0.90 cm  LV E/e' lateral: 8.2 LV IVS:        1.10 cm  LV e' medial:    7.72 cm/s LVOT diam:     2.20 cm  LV E/e' medial:  9.1 LV SV:         50 ml LV SV Index:   23.09 LVOT Area:     3.80 cm  RIGHT VENTRICLE RV Basal diam:  3.70 cm RV S prime:     16.60 cm/s TAPSE (M-mode): 2.3 cm LEFT ATRIUM             Index       RIGHT ATRIUM            Index LA diam:        3.90 cm 1.84 cm/m  RA Area:     12.50 cm LA Vol (A2C):   57.9 ml 27.29 ml/m RA Volume:   29.20 ml  13.76 ml/m LA Vol (A4C):   32.3 ml 15.23 ml/m LA Biplane Vol: 46.6 ml 21.97 ml/m  AORTIC VALVE LVOT Vmax:   92.85 cm/s LVOT Vmean:  66.600 cm/s LVOT VTI:    0.208 m  AORTA Ao Root diam: 4.20 cm Ao Asc diam:  3.90 cm MITRAL VALVE                        TRICUSPID VALVE MV Area (PHT): 4.21 cm             TR Peak grad:   20.4 mmHg MV PHT:        52.20 msec           TR Vmax:        226.00 cm/s MV Decel Time: 180 msec MV E velocity: 70.30 cm/s 103 cm/s  SHUNTS MV A velocity: 73.30 cm/s 70.3 cm/s Systemic VTI:  0.21 m MV E/A ratio:  0.96       1.5       Systemic Diam: 2.20 cm  Ena Dawley MD Electronically signed by Ena Dawley MD Signature Date/Time: 03/09/2019/4:18:25 PM    Final    VAS US CAROTID  Result Date: 03/09/2019 Carotid Arterial Duplex Study Indications:       Patient reports intermittent dizziness x 1 year and one                    presyncope episode occurring a couple of weeks ago. He                    relates his symptoms to blood pressure medication. He states                    since his prescription has be halved he has not experienced                    any of the symptoms. He denies any other cerebrovascular                    symptoms. Risk Factors:      Hypertension, hyperlipidemia, past history of smoking. Comparison Study:  NA Performing Technologist: Sharlett Iles RVT  Examination Guidelines: A complete evaluation includes B-mode imaging, spectral Doppler, color Doppler, and power Doppler as needed of all accessible portions of each vessel. Bilateral testing is considered an integral part of a complete examination. Limited examinations for reoccurring indications may be performed as noted.  Right Carotid Findings: +----------+--------+--------+--------+------------------+--------+  PSV cm/sEDV cm/sStenosisPlaque DescriptionComments  +----------+--------+--------+--------+------------------+--------+ CCA Prox  126     14                                         +----------+--------+--------+--------+------------------+--------+ CCA Distal94      11                                         +----------+--------+--------+--------+------------------+--------+ ICA Prox  84      15                                         +----------+--------+--------+--------+------------------+--------+ ICA Mid   74      17                                         +----------+--------+--------+--------+------------------+--------+ ICA Distal79      21                                         +----------+--------+--------+--------+------------------+--------+ ECA       132     9                                          +----------+--------+--------+--------+------------------+--------+ +----------+--------+-------+----------------+-------------------+           PSV cm/sEDV cmsDescribe        Arm Pressure (mmHG) +----------+--------+-------+----------------+-------------------+ [id number]            Multiphasic, [id number]                 +----------+--------+-------+----------------+-------------------+ +---------+--------+--+--------+-+---------+ VertebralPSV cm/s36EDV cm/s7Antegrade +---------+--------+--+--------+-+---------+  Left Carotid Findings: +----------+--------+--------+--------+------------------+------------------+           PSV cm/sEDV cm/sStenosisPlaque DescriptionComments           +----------+--------+--------+--------+------------------+------------------+ CCA Prox  118     14                                                   +----------+--------+--------+--------+------------------+------------------+ CCA Distal88      16                                                   +----------+--------+--------+--------+------------------+------------------+ ICA Prox  81      18       1-39%   heterogenous      intimal thickening +----------+--------+--------+--------+------------------+------------------+ ICA Mid   68      22                                                   +----------+--------+--------+--------+------------------+------------------+  ICA Distal85      29                                                   +----------+--------+--------+--------+------------------+------------------+ ECA       90      9               heterogenous                         +----------+--------+--------+--------+------------------+------------------+ +----------+--------+--------+----------------+-------------------+           PSV cm/sEDV cm/sDescribe        Arm Pressure (mmHG) +----------+--------+--------+----------------+-------------------+ [id number]             Multiphasic, [id number]                 +----------+--------+--------+----------------+-------------------+ +---------+--------+--+--------+-+---------+ VertebralPSV cm/s34EDV cm/s7Antegrade +---------+--------+--+--------+-+---------+  Summary: Right Carotid: There was no evidence of thrombus, dissection, atherosclerotic                plaque or stenosis in the cervical carotid system. Left Carotid: Velocities in the left ICA are consistent with a 1-39% stenosis. Vertebrals:  Bilateral vertebral arteries demonstrate antegrade flow. Subclavians: Normal flow hemodynamics were seen in bilateral subclavian              arteries. *See table(s) above for measurements and observations.  Electronically signed by Jenkins Rouge MD on 03/09/2019 at 11:40:33 AM.    Final     Assessment & Plan:   Problem List Items Addressed This Visit     Anxiety disorder    On Wellbutrin XL      Asthmatic bronchitis    Cont on Trelegy      Relevant Medications   Fluticasone-Umeclidin-Vilant (TRELEGY ELLIPTA) 100-62.5-25 MCG/ACT AEPB   Chronic low back pain    T#4 prn  Potential benefits of a long term  opioids use as well as potential risks (i.e. addiction risk, apnea etc) and complications (i.e. Somnolence, constipation and others) were explained to the patient and were aknowledged.      Cough    Using T#4 prn       Relevant Orders   DG Chest 2 View   GERD (gastroesophageal reflux disease)    On Protonix  Potential benefits of a long term PPI use as well as potential risks  and complications were explained to the patient and were aknowledged.      Hypertension    On [name], Maxzide      Relevant Medications   sildenafil (VIAGRA) 100 MG tablet   INSOMNIA, CHRONIC    Zolpidem prn  Potential benefits of a long term benzodiazepines  use as well as potential risks  and complications were explained to the patient and were aknowledged.      Well adult exam - Primary    We discussed age appropriate health related issues, including available/recomended screening tests and vaccinations. We discussed a need for adhering to healthy diet and exercise. Labs/EKG were reviewed/ordered. All questions were answered. Last colon 2021 Dr Henrene Pastor, next is due in 2026      Other Visit Diagnoses     Needs flu shot       Relevant Orders   Flu Vaccine QUAD High Dose(Fluad) (Completed)         Meds ordered this encounter  Medications  sildenafil (VIAGRA) 100 MG tablet    Sig: TAKE 1 TABLET(100 MG) BY MOUTH DAILY AS NEEDED FOR ERECTILE DYSFUNCTION    Dispense:  12 tablet    Refill:  5   Fluticasone-Umeclidin-Vilant (TRELEGY ELLIPTA) 100-62.5-25 MCG/ACT AEPB    Sig: Inhale 1 puff into the lungs daily.    Dispense:  3 each    Refill:  3      Follow-up: Return in about 3 months (around 03/13/2022) for a follow-up visit.  Walker Kehr, MD

## 2021-12-11 NOTE — Assessment & Plan Note (Signed)
We discussed age appropriate health related issues, including available/recomended screening tests and vaccinations. We discussed a need for adhering to healthy diet and exercise. Labs/EKG were reviewed/ordered. All questions were answered. Last colon 2021 Dr Henrene Pastor, next is due in 2026

## 2021-12-11 NOTE — Assessment & Plan Note (Signed)
T#4 prn  Potential benefits of a long term opioids use as well as potential risks (i.e. addiction risk, apnea etc) and complications (i.e. Somnolence, constipation and others) were explained to the patient and were aknowledged.

## 2021-12-11 NOTE — Assessment & Plan Note (Signed)
Cont on Trelegy

## 2021-12-11 NOTE — Assessment & Plan Note (Signed)
Using T#4 prn

## 2021-12-11 NOTE — Assessment & Plan Note (Signed)
On [name], Maxzide

## 2021-12-11 NOTE — Assessment & Plan Note (Signed)
Zolpidem prn  Potential benefits of a long term benzodiazepines  use as well as potential risks  and complications were explained to the patient and were aknowledged. 

## 2021-12-14 ENCOUNTER — Other Ambulatory Visit: Payer: Self-pay | Admitting: Internal Medicine

## 2021-12-14 DIAGNOSIS — R918 Other nonspecific abnormal finding of lung field: Secondary | ICD-10-CM

## 2021-12-20 ENCOUNTER — Ambulatory Visit: Payer: BC Managed Care – PPO | Admitting: Internal Medicine

## 2021-12-22 ENCOUNTER — Ambulatory Visit
Admission: RE | Admit: 2021-12-22 | Discharge: 2021-12-22 | Disposition: A | Discharge status: Home / Self Care | Payer: BC Managed Care – PPO | Source: Ambulatory Visit | Attending: Internal Medicine | Admitting: Internal Medicine

## 2021-12-22 DIAGNOSIS — R918 Other nonspecific abnormal finding of lung field: Secondary | ICD-10-CM

## 2021-12-22 DIAGNOSIS — I700 Atherosclerosis of aorta: Secondary | ICD-10-CM | POA: Diagnosis not present

## 2021-12-22 DIAGNOSIS — J479 Bronchiectasis, uncomplicated: Secondary | ICD-10-CM | POA: Diagnosis not present

## 2021-12-22 DIAGNOSIS — J439 Emphysema, unspecified: Secondary | ICD-10-CM | POA: Diagnosis not present

## 2021-12-25 ENCOUNTER — Ambulatory Visit: Payer: BC Managed Care – PPO | Admitting: Internal Medicine

## 2021-12-25 ENCOUNTER — Encounter: Payer: Self-pay | Admitting: Internal Medicine

## 2021-12-26 ENCOUNTER — Other Ambulatory Visit: Payer: Self-pay | Admitting: Internal Medicine

## 2021-12-27 NOTE — Telephone Encounter (Signed)
PT calls today in regards to this matter. Stated with his recent MRI showing possible mild emphysema he was requesting to be seen by a specialist on this (preferably through [facility]).   PT did also note that the fax machine was being repaired at [facility] and when the referrals team sends the refferral to have them use   FAX: [phone]  CB for PT if needed: [phone]

## 2022-01-01 ENCOUNTER — Other Ambulatory Visit: Payer: Self-pay | Admitting: Internal Medicine

## 2022-01-01 DIAGNOSIS — R053 Chronic cough: Secondary | ICD-10-CM

## 2022-01-08 ENCOUNTER — Encounter: Payer: Self-pay | Admitting: Internal Medicine

## 2022-01-08 ENCOUNTER — Ambulatory Visit: Payer: BC Managed Care – PPO | Admitting: Internal Medicine

## 2022-01-08 VITALS — BP 132/80 | HR 63 | Temp 97.9°F | Ht 72.0 in | Wt 198.6 lb

## 2022-01-08 DIAGNOSIS — J4520 Mild intermittent asthma, uncomplicated: Secondary | ICD-10-CM | POA: Diagnosis not present

## 2022-01-08 DIAGNOSIS — E291 Testicular hypofunction: Secondary | ICD-10-CM

## 2022-01-08 DIAGNOSIS — I10 Essential (primary) hypertension: Secondary | ICD-10-CM | POA: Diagnosis not present

## 2022-01-08 DIAGNOSIS — R490 Dysphonia: Secondary | ICD-10-CM | POA: Diagnosis not present

## 2022-01-08 DIAGNOSIS — R053 Chronic cough: Secondary | ICD-10-CM

## 2022-01-08 LAB — TESTOSTERONE: Testosterone: 269.77 ng/dL — ABNORMAL LOW (ref 300.00–890.00)

## 2022-01-08 MED ORDER — BREZTRI AEROSPHERE 160-9-4.8 MCG/ACT IN AERO: 2.0000 | INHALATION_SPRAY | Freq: Two times a day (BID) | RESPIRATORY_TRACT | 11 refills | Status: DC

## 2022-01-08 MED ORDER — ACETAMINOPHEN-CODEINE 300-60 MG PO TABS: 1.0000 | ORAL_TABLET | Freq: Two times a day (BID) | ORAL | 2 refills | Status: DC | PRN

## 2022-01-08 NOTE — Progress Notes (Signed)
Subjective:  Patient ID: Paul [name]., male    DOB: Aug 29, 1945  Age: 76 y.o. MRN: [id number]  CC: Follow-up (F/U on CT)   HPI [employer]. presents for ?hypogonadism F/u abn CT - emphysema C/o mild hoarseness, GERD  Outpatient Medications Prior to Visit  Medication Sig Dispense Refill   atorvastatin (LIPITOR) 10 MG tablet Take 1 tablet by mouth daily. 90 tablet 3   buPROPion (WELLBUTRIN XL) 300 MG 24 hr tablet Take 1 tablet by mouth daily. 90 tablet 1   ciclopirox (PENLAC) 8 % solution Apply topically at bedtime. Apply over nail and surrounding skin daily 6.6 mL 1   fenofibrate (TRICOR) 145 MG tablet TAKE 1 TABLET BY MOUTH EVERY DAY 30 tablet 3   finasteride (PROSCAR) 5 MG tablet Take 1 tablet by mouth daily. 90 tablet 3   gabapentin (NEURONTIN) 100 MG capsule Take 2 capsules (200 mg total) by mouth at bedtime. 180 capsule 0   meloxicam (MOBIC) 15 MG tablet Take 1 tablet (15 mg total) by mouth daily. 30 tablet 0   Multiple Vitamins-Minerals (MENS MULTIVITAMIN) TABS Take by mouth. Take 1 tablet by mouth every day     pantoprazole (PROTONIX) 40 MG tablet Take 1 tablet (40 mg total) by mouth daily. 90 tablet 3   sildenafil (VIAGRA) 100 MG tablet TAKE 1 TABLET(100 MG) BY MOUTH DAILY AS NEEDED FOR ERECTILE DYSFUNCTION 12 tablet 5   triamcinolone cream (KENALOG) 0.5 % Apply 1 application topically 3 (three) times daily as needed. 45 g 2   triamterene-hydrochlorothiazide (MAXZIDE-25) 37.5-25 MG tablet Take 1 tablet by mouth daily. 90 tablet 3   zolpidem (AMBIEN) 10 MG tablet TAKE 1 TABLET(10 MG) BY MOUTH AT BEDTIME AS NEEDED FOR SLEEP 30 tablet 3   acetaminophen-codeine (TYLENOL #4) 300-60 MG tablet TAKE 1 TABLET BY MOUTH EVERY 12 HOURS AS NEEDED FOR PAIN 60 tablet 2   Fluticasone-Umeclidin-Vilant (TRELEGY ELLIPTA) 100-62.5-25 MCG/ACT AEPB Inhale 1 puff into the lungs daily. 3 each 3   No facility-administered medications prior to visit.    ROS: Review of Systems   Constitutional:  Positive for fatigue. Negative for appetite change and unexpected weight change.  HENT:  Negative for congestion, nosebleeds, sneezing, sore throat and trouble swallowing.   Eyes:  Negative for itching and visual disturbance.  Respiratory:  Negative for cough.   Cardiovascular:  Negative for chest pain, palpitations and leg swelling.  Gastrointestinal:  Negative for abdominal distention, blood in stool, diarrhea and nausea.  Genitourinary:  Negative for frequency and hematuria.  Musculoskeletal:  Negative for back pain, gait problem, joint swelling and neck pain.  Skin:  Negative for rash.  Neurological:  Negative for dizziness, tremors, speech difficulty and weakness.  Psychiatric/Behavioral:  Negative for agitation, dysphoric mood and sleep disturbance. The patient is not nervous/anxious.     Objective:  BP 132/80 (BP Location: Left Arm)   Pulse 63   Temp 97.9 F (36.6 C) (Oral)   Ht 6' (1.829 m)   Wt 198 lb 9.6 oz (90.1 kg)   SpO2 96%   BMI 26.94 kg/m   BP Readings from Last 3 Encounters:  01/08/22 132/80  12/11/21 (!) 142/72  09/22/21 124/70    Wt Readings from Last 3 Encounters:  01/08/22 198 lb 9.6 oz (90.1 kg)  12/11/21 200 lb (90.7 kg)  09/22/21 202 lb 6.4 oz (91.8 kg)    Physical Exam Constitutional:      General: He is not in acute distress.  Appearance: He is well-developed.     Comments: NAD  Eyes:     Conjunctiva/sclera: Conjunctivae normal.     Pupils: Pupils are equal, round, and reactive to light.  Neck:     Thyroid: No thyromegaly.     Vascular: No JVD.  Cardiovascular:     Rate and Rhythm: Normal rate and regular rhythm.     Heart sounds: Normal heart sounds. No murmur heard.    No friction rub. No gallop.  Pulmonary:     Effort: Pulmonary effort is normal. No respiratory distress.     Breath sounds: Normal breath sounds. No wheezing or rales.  Chest:     Chest wall: No tenderness.  Abdominal:     General: Bowel sounds  are normal. There is no distension.     Palpations: Abdomen is soft. There is no mass.     Tenderness: There is no abdominal tenderness. There is no guarding or rebound.  Musculoskeletal:        General: No tenderness. Normal range of motion.     Cervical back: Normal range of motion.  Lymphadenopathy:     Cervical: No cervical adenopathy.  Skin:    General: Skin is warm and dry.     Findings: No rash.  Neurological:     Mental Status: He is alert and oriented to person, place, and time.     Cranial Nerves: No cranial nerve deficit.     Motor: No abnormal muscle tone.     Coordination: Coordination normal.     Gait: Gait normal.     Deep Tendon Reflexes: Reflexes are normal and symmetric.  Psychiatric:        Behavior: Behavior normal.        Thought Content: Thought content normal.        Judgment: Judgment normal.    L hand ganglion cyst vs  Lab Results  Component Value Date   WBC 7.4 11/10/2020   HGB 15.3 11/10/2020   HCT 46.4 11/10/2020   PLT 260.0 11/10/2020   GLUCOSE 95 11/10/2020   CHOL 186 05/30/2020   TRIG 209.0 (H) 05/30/2020   HDL 51.30 05/30/2020   LDLDIRECT 96.0 05/30/2020   LDLCALC 98 01/04/2014   ALT 19 11/10/2020   AST 30 11/10/2020   NA 137 11/10/2020   K 4.4 11/10/2020   CL 101 11/10/2020   CREATININE 0.97 11/10/2020   BUN 21 11/10/2020   CO2 30 11/10/2020   TSH 2.73 09/05/2020   PSA 0.17 11/10/2020    CT Chest Wo Contrast  Result Date: 12/24/2021 CLINICAL DATA:  Abnormal xray - lung nodule, < 1 cm, mod-high risk Abnormal chest x-ray with a questioned left lung nodule. Thank you EXAM: CT CHEST WITHOUT CONTRAST TECHNIQUE: Multidetector CT imaging of the chest was performed following the standard protocol without IV contrast. RADIATION DOSE REDUCTION: This exam was performed according to the departmental dose-optimization program which includes automated exposure control, adjustment of the mA and/or kV according to patient size and/or use of  iterative reconstruction technique. COMPARISON:  Radiograph 12/11/2021. Included portions from cardiac CT 03/21/2018. Chest CT 04/01/2014 reviewed FINDINGS: Cardiovascular: The heart is normal in size. Occasional coronary artery calcifications. No pericardial effusion. Minimal fusiform dilatation of the ascending aorta, maximal dimension 4 cm. Mild aortic atherosclerosis. Aberrant right subclavian artery, congenital variant, unchanged. Mediastinum/Nodes: No enlarged mediastinal lymph nodes. There is no obvious bulky hilar adenopathy on this unenhanced exam. Decompressed esophagus. No thyroid nodule. Lungs/Pleura: Chronic biapical pleuroparenchymal scarring. Mild emphysema.  Chronic scarring in the anterior left upper lobe with focal bronchiectasis. No pulmonary mass or suspicious nodule. Particularly, no nodule in the left lung corresponding to that questioned on radiograph. No pleural effusion. The trachea and central bronchi are patent. Upper Abdomen: Simple cyst arising from the upper pole of the right kidney was also seen on prior exam. No specific imaging follow-up is recommended. No acute upper abdominal findings. Musculoskeletal: There is spurring at the left first rib costochondral junction that may account for radiographic abnormality. There are no acute or suspicious osseous abnormalities. Mild scoliosis. There is mild ossification of the posterior longitudinal ligament from the T8-T9 through T10-T11 level causing mild narrowing of the spinal canal. No chest wall soft tissue abnormalities. IMPRESSION: 1. No pulmonary mass or suspicious nodule. Particularly, no nodule in the left lung corresponding to that questioned on radiograph. 2. Minimal emphysema with biapical pleuroparenchymal scarring. Stable scarring in the anterior left upper lobe. Aortic Atherosclerosis (ICD10-I70.0) and Emphysema (ICD10-J43.9). Electronically Signed   By: Keith Rake M.D.   On: 12/24/2021 13:10    Assessment & Plan:    Problem List Items Addressed This Visit     Asthmatic bronchitis    Chest CT w/mild emphesema Cont on Trelegy - d/c C/o mild hoarseness - start [facility] consult is pending      Relevant Medications   Budeson-Glycopyrrol-Formoterol (BREZTRI AEROSPHERE) 160-9-4.8 MCG/ACT AERO   Cough     Cont on Trelegy - d/c C/o mild hoarseness - start [employer] consult is pending      Hoarse voice quality     Cont on Trelegy - d/c C/o mild hoarseness - start [facility] consult is pending      Hypertension    On Rx      Hypogonadism in male - Primary   Relevant Orders   Testosterone      Meds ordered this encounter  Medications   Budeson-Glycopyrrol-Formoterol (BREZTRI AEROSPHERE) 160-9-4.8 MCG/ACT AERO    Sig: Inhale 2 puffs into the lungs 2 (two) times daily.    Dispense:  10.7 g    Refill:  11   acetaminophen-codeine (TYLENOL #4) 300-60 MG tablet    Sig: Take 1 tablet by mouth every 12 (twelve) hours as needed. for pain    Dispense:  60 tablet    Refill:  2      Follow-up: Return in about 3 months (around 04/10/2022) for a follow-up visit.  Walker Kehr, MD

## 2022-01-08 NOTE — Assessment & Plan Note (Signed)
On Rx 

## 2022-01-08 NOTE — Assessment & Plan Note (Signed)
  Cont on Trelegy - d/c C/o mild hoarseness - start [facility] consult is pending

## 2022-01-08 NOTE — Assessment & Plan Note (Signed)
Chest CT w/mild emphesema Cont on Trelegy - d/c C/o mild hoarseness - start [facility] consult is pending

## 2022-01-08 NOTE — Assessment & Plan Note (Signed)
  Cont on Trelegy - d/c C/o mild hoarseness - start [facility] consult is pending

## 2022-01-10 ENCOUNTER — Encounter: Payer: Self-pay | Admitting: Internal Medicine

## 2022-01-18 ENCOUNTER — Institutional Professional Consult (permissible substitution): Payer: BC Managed Care – PPO | Admitting: Pulmonary Disease

## 2022-02-12 ENCOUNTER — Institutional Professional Consult (permissible substitution): Payer: BC Managed Care – PPO | Admitting: Pulmonary Disease
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
## 2022-02-19 ENCOUNTER — Encounter: Payer: Self-pay | Admitting: Internal Medicine

## 2022-02-20 ENCOUNTER — Other Ambulatory Visit: Payer: Self-pay | Admitting: Internal Medicine

## 2022-02-20 MED ORDER — XYOSTED 75 MG/0.5ML ~~LOC~~ SOAJ: 75.0000 mg | SUBCUTANEOUS | 5 refills | Status: DC

## 2022-02-24 ENCOUNTER — Other Ambulatory Visit: Payer: Self-pay | Admitting: Internal Medicine

## 2022-02-27 ENCOUNTER — Ambulatory Visit: Payer: BC Managed Care – PPO | Admitting: Internal Medicine

## 2022-02-27 ENCOUNTER — Encounter: Payer: Self-pay | Admitting: Internal Medicine

## 2022-02-27 ENCOUNTER — Other Ambulatory Visit: Payer: Self-pay | Admitting: *Deleted

## 2022-02-27 VITALS — BP 136/80 | HR 71 | Temp 97.9°F | Ht 72.0 in | Wt 200.0 lb

## 2022-02-27 DIAGNOSIS — J189 Pneumonia, unspecified organism: Secondary | ICD-10-CM | POA: Insufficient documentation

## 2022-02-27 DIAGNOSIS — J4520 Mild intermittent asthma, uncomplicated: Secondary | ICD-10-CM

## 2022-02-27 DIAGNOSIS — J0100 Acute maxillary sinusitis, unspecified: Secondary | ICD-10-CM

## 2022-02-27 HISTORY — DX: Pneumonia, unspecified organism: J18.9

## 2022-02-27 MED ORDER — LEVOFLOXACIN 500 MG PO TABS: 500.0000 mg | ORAL_TABLET | Freq: Every day | ORAL | 0 refills | Status: DC

## 2022-02-27 MED ORDER — BUPROPION HCL ER (XL) 300 MG PO TB24: 300.0000 mg | ORAL_TABLET | Freq: Every day | ORAL | 3 refills | Status: DC

## 2022-02-27 MED ORDER — HYDROCODONE BIT-HOMATROP MBR 5-1.5 MG/5ML PO SOLN: 5.0000 mL | Freq: Four times a day (QID) | ORAL | 0 refills | Status: DC | PRN

## 2022-02-27 MED ORDER — METHYLPREDNISOLONE 4 MG PO TBPK: ORAL_TABLET | ORAL | 0 refills | Status: DC

## 2022-02-27 NOTE — Assessment & Plan Note (Signed)
Medrol pac Cont Breztri Start Levaquin x 10 d Hycodan prn

## 2022-02-27 NOTE — Assessment & Plan Note (Signed)
  Start Levaquin x 10 d Hycodan prn

## 2022-02-27 NOTE — Progress Notes (Signed)
Subjective:  Patient ID: Paul Baron., male    DOB: 1945/06/03  Age: 76 y.o. MRN: [id number]  CC: Cough (And wheezing from being sick 2 weeks ago )   HPI [employer]. presents for cough x 1 month - productive, wheezy; hard cough C/o sinus congstion  Outpatient Medications Prior to Visit  Medication Sig Dispense Refill   acetaminophen-codeine (TYLENOL #4) 300-60 MG tablet Take 1 tablet by mouth every 12 (twelve) hours as needed. for pain 60 tablet 2   atorvastatin (LIPITOR) 10 MG tablet Take 1 tablet by mouth daily. 90 tablet 3   Budeson-Glycopyrrol-Formoterol (BREZTRI AEROSPHERE) 160-9-4.8 MCG/ACT AERO Inhale 2 puffs into the lungs 2 (two) times daily. 10.7 g 11   buPROPion (WELLBUTRIN XL) 300 MG 24 hr tablet Take 1 tablet by mouth daily. 90 tablet 1   ciclopirox (PENLAC) 8 % solution Apply topically at bedtime. Apply over nail and surrounding skin daily 6.6 mL 1   fenofibrate (TRICOR) 145 MG tablet TAKE 1 TABLET BY MOUTH EVERY DAY 30 tablet 3   finasteride (PROSCAR) 5 MG tablet Take 1 tablet by mouth daily. 90 tablet 3   gabapentin (NEURONTIN) 100 MG capsule Take 2 capsules (200 mg total) by mouth at bedtime. 180 capsule 0   ketoconazole (NIZORAL) 2 % cream Apply topically 2 (two) times daily.     meloxicam (MOBIC) 15 MG tablet Take 1 tablet (15 mg total) by mouth daily. 30 tablet 0   Multiple Vitamins-Minerals (MENS MULTIVITAMIN) TABS Take by mouth. Take 1 tablet by mouth every day     pantoprazole (PROTONIX) 40 MG tablet Take 1 tablet (40 mg total) by mouth daily. 90 tablet 3   sildenafil (VIAGRA) 100 MG tablet TAKE 1 TABLET(100 MG) BY MOUTH DAILY AS NEEDED FOR ERECTILE DYSFUNCTION 12 tablet 5   Testosterone Enanthate (XYOSTED) 75 MG/0.5ML SOAJ Inject 75 mg into the skin once a week. 1.96 mL 5   triamterene-hydrochlorothiazide (MAXZIDE-25) 37.5-25 MG tablet Take 1 tablet by mouth daily. 90 tablet 3   zolpidem (AMBIEN) 10 MG tablet TAKE 1 TABLET(10 MG) BY MOUTH AT  BEDTIME AS NEEDED FOR SLEEP 30 tablet 3   No facility-administered medications prior to visit.    ROS: Review of Systems  Constitutional:  Negative for appetite change, fatigue and unexpected weight change.  HENT:  Negative for congestion, nosebleeds, sneezing, sore throat and trouble swallowing.   Eyes:  Negative for itching and visual disturbance.  Respiratory:  Positive for cough and wheezing.   Cardiovascular:  Negative for chest pain, palpitations and leg swelling.  Gastrointestinal:  Negative for abdominal distention, blood in stool, diarrhea and nausea.  Genitourinary:  Negative for frequency and hematuria.  Musculoskeletal:  Negative for back pain, gait problem, joint swelling and neck pain.  Skin:  Negative for rash.  Neurological:  Negative for dizziness, tremors, speech difficulty and weakness.  Psychiatric/Behavioral:  Negative for agitation, dysphoric mood and sleep disturbance. The patient is not nervous/anxious.     Objective:  BP 136/80 (BP Location: Right Arm, Patient Position: Sitting, Cuff Size: Normal)   Pulse 71   Temp 97.9 F (36.6 C) (Oral)   Ht 6' (1.829 m)   Wt 200 lb (90.7 kg)   SpO2 97%   BMI 27.12 kg/m   BP Readings from Last 3 Encounters:  02/27/22 136/80  01/08/22 132/80  12/11/21 (!) 142/72    Wt Readings from Last 3 Encounters:  02/27/22 200 lb (90.7 kg)  01/08/22 198 lb 9.6  oz (90.1 kg)  12/11/21 200 lb (90.7 kg)    Physical Exam Constitutional:      General: He is not in acute distress.    Appearance: Normal appearance. He is well-developed.     Comments: NAD  Eyes:     Conjunctiva/sclera: Conjunctivae normal.     Pupils: Pupils are equal, round, and reactive to light.  Neck:     Thyroid: No thyromegaly.     Vascular: No JVD.  Cardiovascular:     Rate and Rhythm: Normal rate and regular rhythm.     Heart sounds: Normal heart sounds. No murmur heard.    No friction rub. No gallop.  Pulmonary:     Effort: Pulmonary effort is  normal. No respiratory distress.     Breath sounds: Normal breath sounds. No wheezing or rales.  Chest:     Chest wall: No tenderness.  Abdominal:     General: Bowel sounds are normal. There is no distension.     Palpations: Abdomen is soft. There is no mass.     Tenderness: There is no abdominal tenderness. There is no guarding or rebound.  Musculoskeletal:        General: No tenderness. Normal range of motion.     Cervical back: Normal range of motion.  Lymphadenopathy:     Cervical: No cervical adenopathy.  Skin:    General: Skin is warm and dry.     Findings: No rash.  Neurological:     Mental Status: He is alert and oriented to person, place, and time.     Cranial Nerves: No cranial nerve deficit.     Motor: No abnormal muscle tone.     Coordination: Coordination normal.     Gait: Gait normal.     Deep Tendon Reflexes: Reflexes are normal and symmetric.  Psychiatric:        Behavior: Behavior normal.        Thought Content: Thought content normal.        Judgment: Judgment normal.   Crackles at the R base  Lab Results  Component Value Date   WBC 7.4 11/10/2020   HGB 15.3 11/10/2020   HCT 46.4 11/10/2020   PLT 260.0 11/10/2020   GLUCOSE 95 11/10/2020   CHOL 186 05/30/2020   TRIG 209.0 (H) 05/30/2020   HDL 51.30 05/30/2020   LDLDIRECT 96.0 05/30/2020   LDLCALC 98 01/04/2014   ALT 19 11/10/2020   AST 30 11/10/2020   NA 137 11/10/2020   K 4.4 11/10/2020   CL 101 11/10/2020   CREATININE 0.97 11/10/2020   BUN 21 11/10/2020   CO2 30 11/10/2020   TSH 2.73 09/05/2020   PSA 0.17 11/10/2020    CT Chest Wo Contrast  Result Date: 12/24/2021 CLINICAL DATA:  Abnormal xray - lung nodule, < 1 cm, mod-high risk Abnormal chest x-ray with a questioned left lung nodule. Thank you EXAM: CT CHEST WITHOUT CONTRAST TECHNIQUE: Multidetector CT imaging of the chest was performed following the standard protocol without IV contrast. RADIATION DOSE REDUCTION: This exam was performed  according to the departmental dose-optimization program which includes automated exposure control, adjustment of the mA and/or kV according to patient size and/or use of iterative reconstruction technique. COMPARISON:  Radiograph 12/11/2021. Included portions from cardiac CT 03/21/2018. Chest CT 04/01/2014 reviewed FINDINGS: Cardiovascular: The heart is normal in size. Occasional coronary artery calcifications. No pericardial effusion. Minimal fusiform dilatation of the ascending aorta, maximal dimension 4 cm. Mild aortic atherosclerosis. Aberrant right subclavian artery, congenital  variant, unchanged. Mediastinum/Nodes: No enlarged mediastinal lymph nodes. There is no obvious bulky hilar adenopathy on this unenhanced exam. Decompressed esophagus. No thyroid nodule. Lungs/Pleura: Chronic biapical pleuroparenchymal scarring. Mild emphysema. Chronic scarring in the anterior left upper lobe with focal bronchiectasis. No pulmonary mass or suspicious nodule. Particularly, no nodule in the left lung corresponding to that questioned on radiograph. No pleural effusion. The trachea and central bronchi are patent. Upper Abdomen: Simple cyst arising from the upper pole of the right kidney was also seen on prior exam. No specific imaging follow-up is recommended. No acute upper abdominal findings. Musculoskeletal: There is spurring at the left first rib costochondral junction that may account for radiographic abnormality. There are no acute or suspicious osseous abnormalities. Mild scoliosis. There is mild ossification of the posterior longitudinal ligament from the T8-T9 through T10-T11 level causing mild narrowing of the spinal canal. No chest wall soft tissue abnormalities. IMPRESSION: 1. No pulmonary mass or suspicious nodule. Particularly, no nodule in the left lung corresponding to that questioned on radiograph. 2. Minimal emphysema with biapical pleuroparenchymal scarring. Stable scarring in the anterior left upper lobe.  Aortic Atherosclerosis (ICD10-I70.0) and Emphysema (ICD10-J43.9). Electronically Signed   By: Keith Rake M.D.   On: 12/24/2021 13:10    Assessment & Plan:   Problem List Items Addressed This Visit     SINUSITIS- ACUTE-NOS     Start Levaquin x 10 d Hycodan prn      Relevant Medications   HYDROcodone bit-homatropine (HYCODAN) 5-1.5 MG/5ML syrup   methylPREDNISolone (MEDROL DOSEPAK) 4 MG TBPK tablet   levofloxacin (LEVAQUIN) 500 MG tablet   CAP (community acquired pneumonia) - Primary    Probable RLL - CXR Start Levaquin x 10 d Hycodan prn      Relevant Medications   HYDROcodone bit-homatropine (HYCODAN) 5-1.5 MG/5ML syrup   levofloxacin (LEVAQUIN) 500 MG tablet   Other Relevant Orders   DG Chest 2 View   Asthmatic bronchitis    Medrol pac Cont Breztri Start Levaquin x 10 d Hycodan prn      Relevant Medications   methylPREDNISolone (MEDROL DOSEPAK) 4 MG TBPK tablet   Other Relevant Orders   DG Chest 2 View      Meds ordered this encounter  Medications   HYDROcodone bit-homatropine (HYCODAN) 5-1.5 MG/5ML syrup    Sig: Take 5 mLs by mouth every 6 (six) hours as needed for cough.    Dispense:  240 mL    Refill:  0   methylPREDNISolone (MEDROL DOSEPAK) 4 MG TBPK tablet    Sig: As directed    Dispense:  21 tablet    Refill:  0   levofloxacin (LEVAQUIN) 500 MG tablet    Sig: Take 1 tablet (500 mg total) by mouth daily.    Dispense:  10 tablet    Refill:  0      Follow-up: No follow-ups on file.  Walker Kehr, MD

## 2022-02-27 NOTE — Assessment & Plan Note (Signed)
Probable RLL - CXR Start Levaquin x 10 d Hycodan prn

## 2022-03-02 ENCOUNTER — Other Ambulatory Visit: Payer: Self-pay | Admitting: Internal Medicine

## 2022-03-13 ENCOUNTER — Ambulatory Visit: Payer: BC Managed Care – PPO | Admitting: Internal Medicine

## 2022-03-15 ENCOUNTER — Encounter: Payer: Self-pay | Admitting: Pulmonary Disease

## 2022-03-15 ENCOUNTER — Ambulatory Visit: Payer: BC Managed Care – PPO | Admitting: Pulmonary Disease

## 2022-03-15 VITALS — BP 110/80 | HR 71 | Ht 72.0 in | Wt 199.8 lb

## 2022-03-15 DIAGNOSIS — L719 Rosacea, unspecified: Secondary | ICD-10-CM | POA: Diagnosis not present

## 2022-03-15 DIAGNOSIS — R058 Other specified cough: Secondary | ICD-10-CM

## 2022-03-15 DIAGNOSIS — J4520 Mild intermittent asthma, uncomplicated: Secondary | ICD-10-CM

## 2022-03-15 DIAGNOSIS — R053 Chronic cough: Secondary | ICD-10-CM

## 2022-03-15 DIAGNOSIS — D485 Neoplasm of uncertain behavior of skin: Secondary | ICD-10-CM | POA: Diagnosis not present

## 2022-03-15 DIAGNOSIS — L570 Actinic keratosis: Secondary | ICD-10-CM | POA: Diagnosis not present

## 2022-03-15 DIAGNOSIS — L814 Other melanin hyperpigmentation: Secondary | ICD-10-CM | POA: Diagnosis not present

## 2022-03-15 MED ORDER — ALBUTEROL SULFATE HFA 108 (90 BASE) MCG/ACT IN AERS: 2.0000 | INHALATION_SPRAY | Freq: Four times a day (QID) | RESPIRATORY_TRACT | 6 refills | Status: DC | PRN

## 2022-03-15 NOTE — Patient Instructions (Signed)
Thank you for visiting Dr. Valeta Harms at [facility]. Today we recommend the following:  Continue breztri   Meds ordered this encounter  Medications   albuterol (VENTOLIN HFA) 108 (90 Base) MCG/ACT inhaler    Sig: Inhale 2 puffs into the lungs every 6 (six) hours as needed for wheezing or shortness of breath.    Dispense:  8 g    Refill:  6   Return in about 3 months (around 06/14/2022) for with APP or Dr. Valeta Harms.    Please do your part to reduce the spread of COVID-19.
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
## 2022-03-26 ENCOUNTER — Other Ambulatory Visit: Payer: Self-pay | Admitting: *Deleted

## 2022-03-26 ENCOUNTER — Other Ambulatory Visit: Payer: Self-pay | Admitting: Internal Medicine

## 2022-03-26 NOTE — Telephone Encounter (Signed)
Both refills for Triamterene/HCTZ & Finasteride has been sent to pharmacy

## 2022-04-10 ENCOUNTER — Encounter: Payer: Self-pay | Admitting: Internal Medicine

## 2022-04-10 ENCOUNTER — Ambulatory Visit: Payer: BC Managed Care – PPO | Admitting: Internal Medicine

## 2022-04-10 VITALS — BP 136/82 | HR 75 | Temp 97.8°F | Ht 72.0 in | Wt 201.0 lb

## 2022-04-10 DIAGNOSIS — I10 Essential (primary) hypertension: Secondary | ICD-10-CM | POA: Diagnosis not present

## 2022-04-10 DIAGNOSIS — J4520 Mild intermittent asthma, uncomplicated: Secondary | ICD-10-CM

## 2022-04-10 DIAGNOSIS — E291 Testicular hypofunction: Secondary | ICD-10-CM

## 2022-04-10 MED ORDER — PROPRANOLOL HCL 10 MG PO TABS: 10.0000 mg | ORAL_TABLET | Freq: Three times a day (TID) | ORAL | 3 refills | Status: DC | PRN

## 2022-04-10 MED ORDER — ACETAMINOPHEN-CODEINE 300-60 MG PO TABS: 1.0000 | ORAL_TABLET | Freq: Two times a day (BID) | ORAL | 2 refills | Status: DC | PRN

## 2022-04-10 NOTE — Assessment & Plan Note (Signed)
F/u w/Dr Valeta Harms On MDI

## 2022-04-10 NOTE — Assessment & Plan Note (Signed)
On testosterone inj

## 2022-04-10 NOTE — Progress Notes (Signed)
Subjective:  Patient ID: Paul [name]., male    DOB: [birth year]  Age: 77 y.o. MRN: [id number]  CC: Follow-up (Had a spike in bp lastnight took about an hour to go down)   HPI [employer]. presents for BP 177/108 yesterday when he was stressed at work, later it came down...  Outpatient Medications Prior to Visit  Medication Sig Dispense Refill   albuterol (VENTOLIN HFA) 108 (90 Base) MCG/ACT inhaler Inhale 2 puffs into the lungs every 6 (six) hours as needed for wheezing or shortness of breath. 8 g 6   atorvastatin (LIPITOR) 10 MG tablet Take 1 tablet by mouth daily. 90 tablet 3   Budeson-Glycopyrrol-Formoterol (BREZTRI AEROSPHERE) 160-9-4.8 MCG/ACT AERO Inhale 2 puffs into the lungs 2 (two) times daily. 10.7 g 11   buPROPion (WELLBUTRIN XL) 300 MG 24 hr tablet Take 1 tablet (300 mg total) by mouth daily. 90 tablet 3   ciclopirox (PENLAC) 8 % solution Apply topically at bedtime. Apply over nail and surrounding skin daily 6.6 mL 1   fenofibrate (TRICOR) 145 MG tablet TAKE 1 TABLET BY MOUTH EVERY DAY 30 tablet 3   finasteride (PROSCAR) 5 MG tablet Take 1 tablet by mouth daily. 90 tablet 2   ketoconazole (NIZORAL) 2 % cream Apply topically 2 (two) times daily.     meloxicam (MOBIC) 15 MG tablet Take 1 tablet (15 mg total) by mouth daily. 30 tablet 0   methylPREDNISolone (MEDROL DOSEPAK) 4 MG TBPK tablet As directed 21 tablet 0   Multiple Vitamins-Minerals (MENS MULTIVITAMIN) TABS Take by mouth. Take 1 tablet by mouth every day     pantoprazole (PROTONIX) 40 MG tablet Take 1 tablet (40 mg total) by mouth daily. 90 tablet 3   sildenafil (VIAGRA) 100 MG tablet TAKE 1 TABLET(100 MG) BY MOUTH DAILY AS NEEDED FOR ERECTILE DYSFUNCTION 12 tablet 5   Testosterone Enanthate (XYOSTED) 75 MG/0.5ML SOAJ Inject 75 mg into the skin once a week. 1.96 mL 5   triamterene-hydrochlorothiazide (MAXZIDE-25) 37.5-25 MG tablet Take 1 tablet by mouth daily. 90 tablet 2   zolpidem (AMBIEN) 10 MG tablet  TAKE 1 TABLET BY MOUTH AT BEDTIME AS NEEDED 90 tablet 1   acetaminophen-codeine (TYLENOL #4) 300-60 MG tablet TAKE 1 TABLET BY MOUTH EVERY 12 HOURS AS NEEDED FOR PAIN 60 tablet 2   HYDROcodone bit-homatropine (HYCODAN) 5-1.5 MG/5ML syrup Take 5 mLs by mouth every 6 (six) hours as needed for cough. 240 mL 0   levofloxacin (LEVAQUIN) 500 MG tablet Take 1 tablet (500 mg total) by mouth daily. (Patient not taking: Reported on 03/15/2022) 10 tablet 0   No facility-administered medications prior to visit.    ROS: Review of Systems  Constitutional:  Negative for appetite change, fatigue and unexpected weight change.  HENT:  Negative for congestion, nosebleeds, sneezing, sore throat and trouble swallowing.   Eyes:  Negative for itching and visual disturbance.  Respiratory:  Positive for cough.   Cardiovascular:  Negative for chest pain, palpitations and leg swelling.  Gastrointestinal:  Negative for abdominal distention, blood in stool, diarrhea and nausea.  Genitourinary:  Negative for frequency and hematuria.  Musculoskeletal:  Positive for back pain. Negative for gait problem, joint swelling and neck pain.  Skin:  Negative for rash.  Neurological:  Negative for dizziness, tremors, speech difficulty and weakness.  Psychiatric/Behavioral:  Positive for sleep disturbance. Negative for agitation, dysphoric mood and suicidal ideas. The patient is nervous/anxious.     Objective:  BP 136/82 (BP  Location: Right Arm, Patient Position: Sitting, Cuff Size: Normal)   Pulse 75   Temp 97.8 F (36.6 C) (Oral)   Ht 6' (1.829 m)   Wt 201 lb (91.2 kg)   SpO2 98%   BMI 27.26 kg/m   BP Readings from Last 3 Encounters:  04/10/22 136/82  03/15/22 110/80  02/27/22 136/80    Wt Readings from Last 3 Encounters:  04/10/22 201 lb (91.2 kg)  03/15/22 199 lb 12.8 oz (90.6 kg)  02/27/22 200 lb (90.7 kg)    Physical Exam Constitutional:      General: He is not in acute distress.    Appearance: He is  well-developed.     Comments: NAD  Eyes:     Conjunctiva/sclera: Conjunctivae normal.     Pupils: Pupils are equal, round, and reactive to light.  Neck:     Thyroid: No thyromegaly.     Vascular: No JVD.  Cardiovascular:     Rate and Rhythm: Normal rate and regular rhythm.     Heart sounds: Normal heart sounds. No murmur heard.    No friction rub. No gallop.  Pulmonary:     Effort: Pulmonary effort is normal. No respiratory distress.     Breath sounds: Normal breath sounds. No wheezing or rales.  Chest:     Chest wall: No tenderness.  Abdominal:     General: Bowel sounds are normal. There is no distension.     Palpations: Abdomen is soft. There is no mass.     Tenderness: There is no abdominal tenderness. There is no guarding or rebound.  Musculoskeletal:        General: No tenderness. Normal range of motion.     Cervical back: Normal range of motion.  Lymphadenopathy:     Cervical: No cervical adenopathy.  Skin:    General: Skin is warm and dry.     Findings: No rash.  Neurological:     Mental Status: He is alert and oriented to person, place, and time.     Cranial Nerves: No cranial nerve deficit.     Motor: No abnormal muscle tone.     Coordination: Coordination normal.     Gait: Gait normal.     Deep Tendon Reflexes: Reflexes are normal and symmetric.  Psychiatric:        Behavior: Behavior normal.        Thought Content: Thought content normal.        Judgment: Judgment normal.     Lab Results  Component Value Date   WBC 7.4 11/10/2020   HGB 15.3 11/10/2020   HCT 46.4 11/10/2020   PLT 260.0 11/10/2020   GLUCOSE 95 11/10/2020   CHOL 186 05/30/2020   TRIG 209.0 (H) 05/30/2020   HDL 51.30 05/30/2020   LDLDIRECT 96.0 05/30/2020   LDLCALC 98 01/04/2014   ALT 19 11/10/2020   AST 30 11/10/2020   NA 137 11/10/2020   K 4.4 11/10/2020   CL 101 11/10/2020   CREATININE 0.97 11/10/2020   BUN 21 11/10/2020   CO2 30 11/10/2020   TSH 2.73 09/05/2020   PSA 0.17  11/10/2020    CT Chest Wo Contrast  Result Date: 12/24/2021 CLINICAL DATA:  Abnormal xray - lung nodule, < 1 cm, mod-high risk Abnormal chest x-ray with a questioned left lung nodule. Thank you EXAM: CT CHEST WITHOUT CONTRAST TECHNIQUE: Multidetector CT imaging of the chest was performed following the standard protocol without IV contrast. RADIATION DOSE REDUCTION: This exam was performed according  to the departmental dose-optimization program which includes automated exposure control, adjustment of the mA and/or kV according to patient size and/or use of iterative reconstruction technique. COMPARISON:  Radiograph 12/11/2021. Included portions from cardiac CT 03/21/2018. Chest CT 04/01/2014 reviewed FINDINGS: Cardiovascular: The heart is normal in size. Occasional coronary artery calcifications. No pericardial effusion. Minimal fusiform dilatation of the ascending aorta, maximal dimension 4 cm. Mild aortic atherosclerosis. Aberrant right subclavian artery, congenital variant, unchanged. Mediastinum/Nodes: No enlarged mediastinal lymph nodes. There is no obvious bulky hilar adenopathy on this unenhanced exam. Decompressed esophagus. No thyroid nodule. Lungs/Pleura: Chronic biapical pleuroparenchymal scarring. Mild emphysema. Chronic scarring in the anterior left upper lobe with focal bronchiectasis. No pulmonary mass or suspicious nodule. Particularly, no nodule in the left lung corresponding to that questioned on radiograph. No pleural effusion. The trachea and central bronchi are patent. Upper Abdomen: Simple cyst arising from the upper pole of the right kidney was also seen on prior exam. No specific imaging follow-up is recommended. No acute upper abdominal findings. Musculoskeletal: There is spurring at the left first rib costochondral junction that may account for radiographic abnormality. There are no acute or suspicious osseous abnormalities. Mild scoliosis. There is mild ossification of the posterior  longitudinal ligament from the T8-T9 through T10-T11 level causing mild narrowing of the spinal canal. No chest wall soft tissue abnormalities. IMPRESSION: 1. No pulmonary mass or suspicious nodule. Particularly, no nodule in the left lung corresponding to that questioned on radiograph. 2. Minimal emphysema with biapical pleuroparenchymal scarring. Stable scarring in the anterior left upper lobe. Aortic Atherosclerosis (ICD10-I70.0) and Emphysema (ICD10-J43.9). Electronically Signed   By: Keith Rake M.D.   On: 12/24/2021 13:10    Assessment & Plan:   Problem List Items Addressed This Visit       Cardiovascular and Mediastinum   Hypertension - Primary    BP spike due to a stressful day Discussed Propranolol 10 mg 1-2 po tid prn HTN or anxiety      Relevant Medications   propranolol (INDERAL) 10 MG tablet     Respiratory   Asthmatic bronchitis    F/u w/Dr Valeta Harms On MDI        Endocrine   Hypogonadism in male    On testosterone inj         Meds ordered this encounter  Medications   propranolol (INDERAL) 10 MG tablet    Sig: Take 1-2 tablets (10-20 mg total) by mouth 3 (three) times daily as needed.    Dispense:  90 tablet    Refill:  3   acetaminophen-codeine (TYLENOL #4) 300-60 MG tablet    Sig: Take 1 tablet by mouth every 12 (twelve) hours as needed for severe pain. for pain    Dispense:  60 tablet    Refill:  2      Follow-up: Return in about 3 months (around 07/10/2022) for a follow-up visit.  Walker Kehr, MD

## 2022-04-10 NOTE — Assessment & Plan Note (Signed)
BP spike due to a stressful day Discussed Propranolol 10 mg 1-2 po tid prn HTN or anxiety

## 2022-05-06 ENCOUNTER — Encounter: Payer: Self-pay | Admitting: Internal Medicine

## 2022-05-07 NOTE — Telephone Encounter (Signed)
Pls advise on email...Paul [name]

## 2022-05-12 ENCOUNTER — Other Ambulatory Visit: Payer: Self-pay | Admitting: Internal Medicine

## 2022-05-12 MED ORDER — VALSARTAN 160 MG PO TABS: 160.0000 mg | ORAL_TABLET | Freq: Every day | ORAL | 11 refills | Status: DC
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
## 2022-05-30 ENCOUNTER — Ambulatory Visit (INDEPENDENT_AMBULATORY_CARE_PROVIDER_SITE_OTHER): Payer: BC Managed Care – PPO

## 2022-05-30 ENCOUNTER — Encounter: Payer: Self-pay | Admitting: Family Medicine

## 2022-05-30 ENCOUNTER — Other Ambulatory Visit: Payer: Self-pay

## 2022-05-30 ENCOUNTER — Ambulatory Visit: Payer: BC Managed Care – PPO | Admitting: Family Medicine

## 2022-05-30 VITALS — BP 158/98 | HR 72 | Ht 72.0 in | Wt 199.0 lb

## 2022-05-30 DIAGNOSIS — M25512 Pain in left shoulder: Secondary | ICD-10-CM

## 2022-05-30 DIAGNOSIS — S43432A Superior glenoid labrum lesion of left shoulder, initial encounter: Secondary | ICD-10-CM

## 2022-05-30 DIAGNOSIS — G8929 Other chronic pain: Secondary | ICD-10-CM

## 2022-05-30 NOTE — Patient Instructions (Addendum)
Injected shoulder today Exercises Ice in 6 hours See me again in 7-8 weeks

## 2022-05-30 NOTE — Assessment & Plan Note (Signed)
Patient given injection and tolerated the procedure well, discussed icing regimen and home exercise, discussed which activities to do and which ones to avoid.  We discussed that we do not see any true rotator cuff tear noted.  Increase activity slowly.  Follow-up with me again in 6 to 8 weeks.  If worsening pain or weakness do need to consider advanced imaging but hopeful that patient will do well with conservative therapy.

## 2022-05-31 ENCOUNTER — Encounter: Payer: Self-pay | Admitting: Internal Medicine

## 2022-05-31 ENCOUNTER — Ambulatory Visit: Payer: BC Managed Care – PPO | Admitting: Internal Medicine

## 2022-05-31 VITALS — BP 128/84 | HR 70 | Temp 98.6°F | Ht 72.0 in | Wt 197.0 lb

## 2022-05-31 DIAGNOSIS — Z125 Encounter for screening for malignant neoplasm of prostate: Secondary | ICD-10-CM | POA: Diagnosis not present

## 2022-05-31 DIAGNOSIS — I10 Essential (primary) hypertension: Secondary | ICD-10-CM | POA: Diagnosis not present

## 2022-05-31 DIAGNOSIS — Z0000 Encounter for general adult medical examination without abnormal findings: Secondary | ICD-10-CM | POA: Diagnosis not present

## 2022-05-31 DIAGNOSIS — R053 Chronic cough: Secondary | ICD-10-CM | POA: Diagnosis not present

## 2022-05-31 DIAGNOSIS — G4700 Insomnia, unspecified: Secondary | ICD-10-CM | POA: Diagnosis not present

## 2022-05-31 LAB — CBC WITH DIFFERENTIAL/PLATELET
Basophils Absolute: 0.1 10*3/uL (ref 0.0–0.1)
Basophils Relative: 0.8 % (ref 0.0–3.0)
Eosinophils Absolute: 0.1 10*3/uL (ref 0.0–0.7)
Eosinophils Relative: 0.5 % (ref 0.0–5.0)
HCT: 51.1 % (ref 39.0–52.0)
Hemoglobin: 17.4 g/dL — ABNORMAL HIGH (ref 13.0–17.0)
Lymphocytes Relative: 13.7 % (ref 12.0–46.0)
Lymphs Abs: 1.4 10*3/uL (ref 0.7–4.0)
MCHC: 34 g/dL (ref 30.0–36.0)
MCV: 92 fl (ref 78.0–100.0)
Monocytes Absolute: 0.8 10*3/uL (ref 0.1–1.0)
Monocytes Relative: 8 % (ref 3.0–12.0)
Neutro Abs: 7.9 10*3/uL — ABNORMAL HIGH (ref 1.4–7.7)
Neutrophils Relative %: 77 % (ref 43.0–77.0)
Platelets: 281 10*3/uL (ref 150.0–400.0)
RBC: 5.55 Mil/uL (ref 4.22–5.81)
RDW: 12.9 % (ref 11.5–15.5)
WBC: 10.2 10*3/uL (ref 4.0–10.5)

## 2022-05-31 LAB — COMPREHENSIVE METABOLIC PANEL
ALT: 18 U/L (ref 0–53)
AST: 28 U/L (ref 0–37)
Albumin: 4.7 g/dL (ref 3.5–5.2)
Alkaline Phosphatase: 52 U/L (ref 39–117)
BUN: 25 mg/dL — ABNORMAL HIGH (ref 6–23)
CO2: 30 mEq/L (ref 19–32)
Calcium: 9.5 mg/dL (ref 8.4–10.5)
Chloride: 99 mEq/L (ref 96–112)
Creatinine, Ser: 0.95 mg/dL (ref 0.40–1.50)
GFR: 77.03 mL/min (ref >60.00)
Glucose, Bld: 109 mg/dL — ABNORMAL HIGH (ref 70–99)
Potassium: 4.4 mEq/L (ref 3.5–5.1)
Sodium: 137 mEq/L (ref 135–145)
Total Bilirubin: 0.8 mg/dL (ref 0.2–1.2)
Total Protein: 7 g/dL (ref 6.0–8.3)

## 2022-05-31 LAB — URINALYSIS
Bilirubin Urine: NEGATIVE
Hgb urine dipstick: NEGATIVE
Ketones, ur: NEGATIVE
Leukocytes,Ua: NEGATIVE
Nitrite: NEGATIVE
Specific Gravity, Urine: 1.02 (ref 1.000–1.030)
Total Protein, Urine: NEGATIVE
Urine Glucose: NEGATIVE
Urobilinogen, UA: 0.2 (ref 0.0–1.0)
pH: 6 (ref 5.0–8.0)

## 2022-05-31 LAB — LIPID PANEL
Cholesterol: 190 mg/dL (ref 0–200)
HDL: 57.8 mg/dL (ref >39.00)
LDL Cholesterol: 103 mg/dL — ABNORMAL HIGH (ref 0–99)
NonHDL: 131.73
Total CHOL/HDL Ratio: 3
Triglycerides: 144 mg/dL (ref 0.0–149.0)
VLDL: 28.8 mg/dL (ref 0.0–40.0)

## 2022-05-31 LAB — PSA: PSA: 0.16 ng/mL (ref 0.10–4.00)

## 2022-05-31 LAB — TSH: TSH: 1.63 u[IU]/mL (ref 0.35–5.50)

## 2022-05-31 MED ORDER — ACETAMINOPHEN-CODEINE 300-60 MG PO TABS: 1.0000 | ORAL_TABLET | Freq: Two times a day (BID) | ORAL | 2 refills | Status: DC | PRN

## 2022-05-31 MED ORDER — ZOLPIDEM TARTRATE 10 MG PO TABS: ORAL_TABLET | ORAL | 1 refills | Status: DC

## 2022-05-31 NOTE — Progress Notes (Signed)
Subjective:  Patient ID: Paul Baron., male    DOB: Nov 28, 1945  Age: 77 y.o. MRN: [id number]  CC: Follow-up (3 mnth f/u, ringing in ears)   HPI [employer]. presents for elevated BP - SBP 156 Not taking Valsartan Dizzy w/Maxzide Follow-up on insomnia, cough   Outpatient Medications Prior to Visit  Medication Sig Dispense Refill   albuterol (VENTOLIN HFA) 108 (90 Base) MCG/ACT inhaler Inhale 2 puffs into the lungs every 6 (six) hours as needed for wheezing or shortness of breath. 8 g 6   atorvastatin (LIPITOR) 10 MG tablet Take 1 tablet by mouth daily. 90 tablet 3   Budeson-Glycopyrrol-Formoterol (BREZTRI AEROSPHERE) 160-9-4.8 MCG/ACT AERO Inhale 2 puffs into the lungs 2 (two) times daily. 10.7 g 11   buPROPion (WELLBUTRIN XL) 300 MG 24 hr tablet Take 1 tablet (300 mg total) by mouth daily. 90 tablet 3   ciclopirox (PENLAC) 8 % solution Apply topically at bedtime. Apply over nail and surrounding skin daily 6.6 mL 1   fenofibrate (TRICOR) 145 MG tablet TAKE 1 TABLET BY MOUTH EVERY DAY 30 tablet 3   finasteride (PROSCAR) 5 MG tablet Take 1 tablet by mouth daily. 90 tablet 2   ketoconazole (NIZORAL) 2 % cream Apply topically 2 (two) times daily.     meloxicam (MOBIC) 15 MG tablet Take 1 tablet (15 mg total) by mouth daily. 30 tablet 0   methylPREDNISolone (MEDROL DOSEPAK) 4 MG TBPK tablet As directed 21 tablet 0   Multiple Vitamins-Minerals (MENS MULTIVITAMIN) TABS Take by mouth. Take 1 tablet by mouth every day     pantoprazole (PROTONIX) 40 MG tablet Take 1 tablet (40 mg total) by mouth daily. 90 tablet 3   sildenafil (VIAGRA) 100 MG tablet TAKE 1 TABLET(100 MG) BY MOUTH DAILY AS NEEDED FOR ERECTILE DYSFUNCTION 12 tablet 5   Testosterone Enanthate (XYOSTED) 75 MG/0.5ML SOAJ Inject 75 mg into the skin once a week. 1.96 mL 5   triamterene-hydrochlorothiazide (MAXZIDE-25) 37.5-25 MG tablet Take 1 tablet by mouth daily. 90 tablet 2   valsartan (DIOVAN) 160 MG tablet Take 1  tablet (160 mg total) by mouth daily. 30 tablet 11   acetaminophen-codeine (TYLENOL #4) 300-60 MG tablet Take 1 tablet by mouth every 12 (twelve) hours as needed for severe pain. for pain 60 tablet 2   zolpidem (AMBIEN) 10 MG tablet TAKE 1 TABLET BY MOUTH AT BEDTIME AS NEEDED 90 tablet 1   No facility-administered medications prior to visit.    ROS: Review of Systems  Constitutional:  Negative for appetite change, fatigue and unexpected weight change.  HENT:  Positive for congestion. Negative for nosebleeds, sneezing, sore throat and trouble swallowing.   Eyes:  Negative for itching and visual disturbance.  Respiratory:  Negative for cough.   Cardiovascular:  Negative for chest pain, palpitations and leg swelling.  Gastrointestinal:  Negative for abdominal distention, blood in stool, diarrhea and nausea.  Genitourinary:  Negative for frequency and hematuria.  Musculoskeletal:  Positive for back pain. Negative for gait problem, joint swelling and neck pain.  Skin:  Negative for rash.  Neurological:  Negative for dizziness, tremors, speech difficulty and weakness.  Psychiatric/Behavioral:  Positive for sleep disturbance. Negative for agitation, dysphoric mood and suicidal ideas. The patient is nervous/anxious.     Objective:  BP 128/84 (BP Location: Left Arm, Patient Position: Sitting, Cuff Size: Large)   Pulse 70   Temp 98.6 F (37 C) (Oral)   Ht 6' (1.829 m)  Wt 197 lb (89.4 kg)   SpO2 97%   BMI 26.72 kg/m   BP Readings from Last 3 Encounters:  05/31/22 128/84  05/30/22 (!) 158/98  04/10/22 136/82    Wt Readings from Last 3 Encounters:  05/31/22 197 lb (89.4 kg)  05/30/22 199 lb (90.3 kg)  04/10/22 201 lb (91.2 kg)    Physical Exam Constitutional:      General: He is not in acute distress.    Appearance: He is well-developed.     Comments: NAD  Eyes:     Conjunctiva/sclera: Conjunctivae normal.     Pupils: Pupils are equal, round, and reactive to light.  Neck:      Thyroid: No thyromegaly.     Vascular: No JVD.  Cardiovascular:     Rate and Rhythm: Normal rate and regular rhythm.     Heart sounds: Normal heart sounds. No murmur heard.    No friction rub. No gallop.  Pulmonary:     Effort: Pulmonary effort is normal. No respiratory distress.     Breath sounds: Normal breath sounds. No wheezing or rales.  Chest:     Chest wall: No tenderness.  Abdominal:     General: Bowel sounds are normal. There is no distension.     Palpations: Abdomen is soft. There is no mass.     Tenderness: There is no abdominal tenderness. There is no guarding or rebound.  Musculoskeletal:        General: Tenderness present. Normal range of motion.     Cervical back: Normal range of motion.  Lymphadenopathy:     Cervical: No cervical adenopathy.  Skin:    General: Skin is warm and dry.     Findings: No rash.  Neurological:     Mental Status: He is alert and oriented to person, place, and time.     Cranial Nerves: No cranial nerve deficit.     Motor: No abnormal muscle tone.     Coordination: Coordination normal.     Gait: Gait normal.     Deep Tendon Reflexes: Reflexes are normal and symmetric.  Psychiatric:        Behavior: Behavior normal.        Thought Content: Thought content normal.        Judgment: Judgment normal.   LS w/pain   Lab Results  Component Value Date   WBC 10.2 05/31/2022   HGB 17.4 (H) 05/31/2022   HCT 51.1 05/31/2022   PLT 281.0 05/31/2022   GLUCOSE 109 (H) 05/31/2022   CHOL 190 05/31/2022   TRIG 144.0 05/31/2022   HDL 57.80 05/31/2022   LDLDIRECT 96.0 05/30/2020   LDLCALC 103 (H) 05/31/2022   ALT 18 05/31/2022   AST 28 05/31/2022   NA 137 05/31/2022   K 4.4 05/31/2022   CL 99 05/31/2022   CREATININE 0.95 05/31/2022   BUN 25 (H) 05/31/2022   CO2 30 05/31/2022   TSH 1.63 05/31/2022   PSA 0.16 05/31/2022    CT Chest Wo Contrast  Result Date: 12/24/2021 CLINICAL DATA:  Abnormal xray - lung nodule, < 1 cm, mod-high risk  Abnormal chest x-ray with a questioned left lung nodule. Thank you EXAM: CT CHEST WITHOUT CONTRAST TECHNIQUE: Multidetector CT imaging of the chest was performed following the standard protocol without IV contrast. RADIATION DOSE REDUCTION: This exam was performed according to the departmental dose-optimization program which includes automated exposure control, adjustment of the mA and/or kV according to patient size and/or use of iterative reconstruction  technique. COMPARISON:  Radiograph 12/11/2021. Included portions from cardiac CT 03/21/2018. Chest CT 04/01/2014 reviewed FINDINGS: Cardiovascular: The heart is normal in size. Occasional coronary artery calcifications. No pericardial effusion. Minimal fusiform dilatation of the ascending aorta, maximal dimension 4 cm. Mild aortic atherosclerosis. Aberrant right subclavian artery, congenital variant, unchanged. Mediastinum/Nodes: No enlarged mediastinal lymph nodes. There is no obvious bulky hilar adenopathy on this unenhanced exam. Decompressed esophagus. No thyroid nodule. Lungs/Pleura: Chronic biapical pleuroparenchymal scarring. Mild emphysema. Chronic scarring in the anterior left upper lobe with focal bronchiectasis. No pulmonary mass or suspicious nodule. Particularly, no nodule in the left lung corresponding to that questioned on radiograph. No pleural effusion. The trachea and central bronchi are patent. Upper Abdomen: Simple cyst arising from the upper pole of the right kidney was also seen on prior exam. No specific imaging follow-up is recommended. No acute upper abdominal findings. Musculoskeletal: There is spurring at the left first rib costochondral junction that may account for radiographic abnormality. There are no acute or suspicious osseous abnormalities. Mild scoliosis. There is mild ossification of the posterior longitudinal ligament from the T8-T9 through T10-T11 level causing mild narrowing of the spinal canal. No chest wall soft tissue  abnormalities. IMPRESSION: 1. No pulmonary mass or suspicious nodule. Particularly, no nodule in the left lung corresponding to that questioned on radiograph. 2. Minimal emphysema with biapical pleuroparenchymal scarring. Stable scarring in the anterior left upper lobe. Aortic Atherosclerosis (ICD10-I70.0) and Emphysema (ICD10-J43.9). Electronically Signed   By: Keith Rake M.D.   On: 12/24/2021 13:10    Assessment & Plan:   Problem List Items Addressed This Visit       Cardiovascular and Mediastinum   Hypertension - Primary    Not taking Valsartan - re-start valsartan Dizzy w/Maxzide- take 1/2 tab daily Hydrate well        Other   Well adult exam   INSOMNIA, CHRONIC    Chronic  Continue zolpidem prn  Potential benefits of a long term benzodiazepines  use as well as potential risks  and complications were explained to the patient and were aknowledged.      Cough    Better Continue with Breztri         Meds ordered this encounter  Medications   acetaminophen-codeine (TYLENOL #4) 300-60 MG tablet    Sig: Take 1 tablet by mouth every 12 (twelve) hours as needed for severe pain. for pain    Dispense:  60 tablet    Refill:  2   zolpidem (AMBIEN) 10 MG tablet    Sig: TAKE 1 TABLET BY MOUTH AT BEDTIME AS NEEDED    Dispense:  90 tablet    Refill:  1      Follow-up: Return in about 3 months (around 08/31/2022) for a follow-up visit.  Walker Kehr, MD

## 2022-05-31 NOTE — Progress Notes (Signed)
Subjective:  Patient ID: Paul [name]., male    DOB: [birth year]  Age: 77 y.o. MRN: [id number]  CC: Follow-up (3 mnth f/u, ringing in ears)   HPI [employer]. presents for [employer]. presents for elevated BP - SBP 156 Not taking Valsartan Dizzy w/Maxzide  Outpatient Medications Prior to Visit  Medication Sig Dispense Refill   acetaminophen-codeine (TYLENOL #4) 300-60 MG tablet Take 1 tablet by mouth every 12 (twelve) hours as needed for severe pain. for pain 60 tablet 2   albuterol (VENTOLIN HFA) 108 (90 Base) MCG/ACT inhaler Inhale 2 puffs into the lungs every 6 (six) hours as needed for wheezing or shortness of breath. 8 g 6   atorvastatin (LIPITOR) 10 MG tablet Take 1 tablet by mouth daily. 90 tablet 3   Budeson-Glycopyrrol-Formoterol (BREZTRI AEROSPHERE) 160-9-4.8 MCG/ACT AERO Inhale 2 puffs into the lungs 2 (two) times daily. 10.7 g 11   buPROPion (WELLBUTRIN XL) 300 MG 24 hr tablet Take 1 tablet (300 mg total) by mouth daily. 90 tablet 3   ciclopirox (PENLAC) 8 % solution Apply topically at bedtime. Apply over nail and surrounding skin daily 6.6 mL 1   fenofibrate (TRICOR) 145 MG tablet TAKE 1 TABLET BY MOUTH EVERY DAY 30 tablet 3   finasteride (PROSCAR) 5 MG tablet Take 1 tablet by mouth daily. 90 tablet 2   ketoconazole (NIZORAL) 2 % cream Apply topically 2 (two) times daily.     meloxicam (MOBIC) 15 MG tablet Take 1 tablet (15 mg total) by mouth daily. 30 tablet 0   methylPREDNISolone (MEDROL DOSEPAK) 4 MG TBPK tablet As directed 21 tablet 0   Multiple Vitamins-Minerals (MENS MULTIVITAMIN) TABS Take by mouth. Take 1 tablet by mouth every day     pantoprazole (PROTONIX) 40 MG tablet Take 1 tablet (40 mg total) by mouth daily. 90 tablet 3   sildenafil (VIAGRA) 100 MG tablet TAKE 1 TABLET(100 MG) BY MOUTH DAILY AS NEEDED FOR ERECTILE DYSFUNCTION 12 tablet 5   Testosterone Enanthate (XYOSTED) 75 MG/0.5ML SOAJ Inject 75 mg into the skin once a week. 1.96 mL 5    triamterene-hydrochlorothiazide (MAXZIDE-25) 37.5-25 MG tablet Take 1 tablet by mouth daily. 90 tablet 2   valsartan (DIOVAN) 160 MG tablet Take 1 tablet (160 mg total) by mouth daily. 30 tablet 11   zolpidem (AMBIEN) 10 MG tablet TAKE 1 TABLET BY MOUTH AT BEDTIME AS NEEDED 90 tablet 1   No facility-administered medications prior to visit.    ROS: Review of Systems  Objective:  BP 128/84 (BP Location: Left Arm, Patient Position: Sitting, Cuff Size: Large)   Pulse 70   Temp 98.6 F (37 C) (Oral)   Ht 6' (1.829 m)   Wt 197 lb (89.4 kg)   SpO2 97%   BMI 26.72 kg/m   BP Readings from Last 3 Encounters:  05/31/22 128/84  05/30/22 (!) 158/98  04/10/22 136/82    Wt Readings from Last 3 Encounters:  05/31/22 197 lb (89.4 kg)  05/30/22 199 lb (90.3 kg)  04/10/22 201 lb (91.2 kg)    Physical Exam  Lab Results  Component Value Date   WBC 7.4 11/10/2020   HGB 15.3 11/10/2020   HCT 46.4 11/10/2020   PLT 260.0 11/10/2020   GLUCOSE 95 11/10/2020   CHOL 186 05/30/2020   TRIG 209.0 (H) 05/30/2020   HDL 51.30 05/30/2020   LDLDIRECT 96.0 05/30/2020   LDLCALC 98 01/04/2014   ALT 19 11/10/2020   AST  30 11/10/2020   NA 137 11/10/2020   K 4.4 11/10/2020   CL 101 11/10/2020   CREATININE 0.97 11/10/2020   BUN 21 11/10/2020   CO2 30 11/10/2020   TSH 2.73 09/05/2020   PSA 0.17 11/10/2020    CT Chest Wo Contrast  Result Date: 12/24/2021 CLINICAL DATA:  Abnormal xray - lung nodule, < 1 cm, mod-high risk Abnormal chest x-ray with a questioned left lung nodule. Thank you EXAM: CT CHEST WITHOUT CONTRAST TECHNIQUE: Multidetector CT imaging of the chest was performed following the standard protocol without IV contrast. RADIATION DOSE REDUCTION: This exam was performed according to the departmental dose-optimization program which includes automated exposure control, adjustment of the mA and/or kV according to patient size and/or use of iterative reconstruction technique. COMPARISON:   Radiograph 12/11/2021. Included portions from cardiac CT 03/21/2018. Chest CT 04/01/2014 reviewed FINDINGS: Cardiovascular: The heart is normal in size. Occasional coronary artery calcifications. No pericardial effusion. Minimal fusiform dilatation of the ascending aorta, maximal dimension 4 cm. Mild aortic atherosclerosis. Aberrant right subclavian artery, congenital variant, unchanged. Mediastinum/Nodes: No enlarged mediastinal lymph nodes. There is no obvious bulky hilar adenopathy on this unenhanced exam. Decompressed esophagus. No thyroid nodule. Lungs/Pleura: Chronic biapical pleuroparenchymal scarring. Mild emphysema. Chronic scarring in the anterior left upper lobe with focal bronchiectasis. No pulmonary mass or suspicious nodule. Particularly, no nodule in the left lung corresponding to that questioned on radiograph. No pleural effusion. The trachea and central bronchi are patent. Upper Abdomen: Simple cyst arising from the upper pole of the right kidney was also seen on prior exam. No specific imaging follow-up is recommended. No acute upper abdominal findings. Musculoskeletal: There is spurring at the left first rib costochondral junction that may account for radiographic abnormality. There are no acute or suspicious osseous abnormalities. Mild scoliosis. There is mild ossification of the posterior longitudinal ligament from the T8-T9 through T10-T11 level causing mild narrowing of the spinal canal. No chest wall soft tissue abnormalities. IMPRESSION: 1. No pulmonary mass or suspicious nodule. Particularly, no nodule in the left lung corresponding to that questioned on radiograph. 2. Minimal emphysema with biapical pleuroparenchymal scarring. Stable scarring in the anterior left upper lobe. Aortic Atherosclerosis (ICD10-I70.0) and Emphysema (ICD10-J43.9). Electronically Signed   By: Keith Rake M.D.   On: 12/24/2021 13:10    Assessment & Plan:   Problem List Items Addressed This Visit   None      No orders of the defined types were placed in this encounter.     Follow-up: Return in about 3 months (around 08/31/2022) for a follow-up visit.  Walker Kehr, MD

## 2022-05-31 NOTE — Assessment & Plan Note (Addendum)
Not taking Valsartan - re-start valsartan Dizzy w/Maxzide- take 1/2 tab daily Hydrate well

## 2022-05-31 NOTE — Patient Instructions (Addendum)
Take Maxzide 1/2 tab daily Re-start Valsartan 160 mg/d

## 2022-06-03 ENCOUNTER — Encounter: Payer: Self-pay | Admitting: Internal Medicine

## 2022-06-03 NOTE — Assessment & Plan Note (Signed)
Chronic  Continue zolpidem prn  Potential benefits of a long term benzodiazepines  use as well as potential risks  and complications were explained to the patient and were aknowledged.

## 2022-06-03 NOTE — Assessment & Plan Note (Signed)
Better Continue with [employer]

## 2022-06-13 ENCOUNTER — Other Ambulatory Visit: Payer: Self-pay | Admitting: *Deleted

## 2022-06-14 MED ORDER — XYOSTED 75 MG/0.5ML ~~LOC~~ SOAJ: 75.0000 mg | SUBCUTANEOUS | 5 refills | Status: DC

## 2022-06-18 ENCOUNTER — Ambulatory Visit: Payer: BC Managed Care – PPO | Admitting: Family Medicine

## 2022-06-18 ENCOUNTER — Telehealth: Payer: Self-pay | Admitting: Internal Medicine

## 2022-06-27 ENCOUNTER — Encounter: Payer: Self-pay | Admitting: Internal Medicine

## 2022-07-02 ENCOUNTER — Ambulatory Visit: Payer: BC Managed Care – PPO | Admitting: Internal Medicine

## 2022-07-09 ENCOUNTER — Encounter: Payer: Self-pay | Admitting: Pulmonary Disease

## 2022-07-09 ENCOUNTER — Ambulatory Visit: Payer: BC Managed Care – PPO | Admitting: Pulmonary Disease

## 2022-07-09 VITALS — BP 138/80 | HR 62 | Ht 72.0 in | Wt 201.8 lb

## 2022-07-09 DIAGNOSIS — J4520 Mild intermittent asthma, uncomplicated: Secondary | ICD-10-CM | POA: Diagnosis not present

## 2022-07-09 DIAGNOSIS — R053 Chronic cough: Secondary | ICD-10-CM

## 2022-07-09 NOTE — Patient Instructions (Signed)
Thank you for visiting Dr. Tonia Brooms at [facility]. Today we recommend the following:  Orders Placed This Encounter  Procedures   Pulmonary Function Test    Return in about 4 weeks (around 08/06/2022) for with APP, after PFTs.    Please do your part to reduce the spread of COVID-19.
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
## 2022-07-10 NOTE — Telephone Encounter (Signed)
error 
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
## 2022-07-19 ENCOUNTER — Other Ambulatory Visit: Payer: Self-pay

## 2022-07-19 ENCOUNTER — Ambulatory Visit: Payer: BC Managed Care – PPO | Admitting: Family Medicine

## 2022-07-19 ENCOUNTER — Encounter: Payer: Self-pay | Admitting: Family Medicine

## 2022-07-19 VITALS — BP 122/84 | HR 65 | Ht 72.0 in | Wt 203.0 lb

## 2022-07-19 DIAGNOSIS — S43432A Superior glenoid labrum lesion of left shoulder, initial encounter: Secondary | ICD-10-CM

## 2022-07-19 DIAGNOSIS — M25512 Pain in left shoulder: Secondary | ICD-10-CM | POA: Diagnosis not present

## 2022-07-19 DIAGNOSIS — G8929 Other chronic pain: Secondary | ICD-10-CM | POA: Diagnosis not present

## 2022-07-19 NOTE — Patient Instructions (Signed)
Injected shoulder today Read about PRP See me in  6-8 weeks

## 2022-07-19 NOTE — Assessment & Plan Note (Signed)
Still has findings consistent with a SLAP tear of the ultrasound.  Still has some hypoechoic changes.  Patient wanted to continue with some of the physical therapy, and injection today secondary to the swelling.  Patient is hopeful that he will have as much improvement if not more.  Discussed otherwise we will need to consider advanced imaging.  Patient will follow-up again in 6 to 8 weeks.

## 2022-07-30 ENCOUNTER — Encounter: Payer: Self-pay | Admitting: Family Medicine
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
## 2022-08-10 ENCOUNTER — Encounter: Payer: Self-pay | Admitting: Family Medicine

## 2022-08-10 ENCOUNTER — Other Ambulatory Visit: Payer: Self-pay

## 2022-08-10 ENCOUNTER — Ambulatory Visit (INDEPENDENT_AMBULATORY_CARE_PROVIDER_SITE_OTHER): Payer: Self-pay | Admitting: Family Medicine

## 2022-08-10 VITALS — BP 132/86 | HR 67 | Ht 72.0 in | Wt 200.0 lb

## 2022-08-10 DIAGNOSIS — M25512 Pain in left shoulder: Secondary | ICD-10-CM

## 2022-08-10 DIAGNOSIS — S43432A Superior glenoid labrum lesion of left shoulder, initial encounter: Secondary | ICD-10-CM

## 2022-08-10 NOTE — Assessment & Plan Note (Signed)
PRP given today, tolerated the procedure well.  Post PRP protocol given.  Follow-up again in 6 to 8 weeks

## 2022-08-10 NOTE — Patient Instructions (Signed)
No ice or IBU for 3 days Heat and Tylenol are ok See me again in 6 weeks 

## 2022-08-23 ENCOUNTER — Other Ambulatory Visit: Payer: Self-pay | Admitting: Internal Medicine
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
## 2022-08-30 ENCOUNTER — Ambulatory Visit: Payer: BC Managed Care – PPO | Admitting: Nurse Practitioner

## 2022-09-03 ENCOUNTER — Ambulatory Visit: Payer: BC Managed Care – PPO | Admitting: Family Medicine

## 2022-09-03 ENCOUNTER — Ambulatory Visit: Payer: BC Managed Care – PPO | Admitting: Internal Medicine

## 2022-09-12 ENCOUNTER — Encounter: Payer: Self-pay | Admitting: Internal Medicine

## 2022-09-12 ENCOUNTER — Ambulatory Visit: Payer: BC Managed Care – PPO | Admitting: Internal Medicine

## 2022-09-12 VITALS — BP 120/60 | HR 67 | Temp 98.3°F | Ht 72.0 in | Wt 201.0 lb

## 2022-09-12 DIAGNOSIS — I2583 Coronary atherosclerosis due to lipid rich plaque: Secondary | ICD-10-CM

## 2022-09-12 DIAGNOSIS — I2510 Atherosclerotic heart disease of native coronary artery without angina pectoris: Secondary | ICD-10-CM

## 2022-09-12 DIAGNOSIS — F419 Anxiety disorder, unspecified: Secondary | ICD-10-CM

## 2022-09-12 DIAGNOSIS — I10 Essential (primary) hypertension: Secondary | ICD-10-CM | POA: Diagnosis not present

## 2022-09-12 DIAGNOSIS — E291 Testicular hypofunction: Secondary | ICD-10-CM | POA: Diagnosis not present

## 2022-09-12 MED ORDER — ACETAMINOPHEN-CODEINE 300-60 MG PO TABS: 1.0000 | ORAL_TABLET | Freq: Two times a day (BID) | ORAL | 2 refills | Status: DC | PRN

## 2022-09-12 MED ORDER — ZOLPIDEM TARTRATE 10 MG PO TABS: ORAL_TABLET | ORAL | 1 refills | Status: DC

## 2022-09-12 NOTE — Assessment & Plan Note (Signed)
Not taking Valsartan - re-start valsartan Dizzy w/Maxzide- take 1/2 tab daily Hydrate well 

## 2022-09-12 NOTE — Assessment & Plan Note (Signed)
On Wellbutrin XL 

## 2022-09-12 NOTE — Progress Notes (Signed)
Subjective:  Patient ID: Paul Blazer., male    DOB: 07-17-1945  Age: 77 y.o. MRN: [id number]  CC: Follow-up   HPI Paul Oba. presents for LBP, anxiety, dyslipidemia  Outpatient Medications Prior to Visit  Medication Sig Dispense Refill   albuterol (VENTOLIN HFA) 108 (90 Base) MCG/ACT inhaler Inhale 2 puffs into the lungs every 6 (six) hours as needed for wheezing or shortness of breath. 8 g 6   atorvastatin (LIPITOR) 10 MG tablet Take 1 tablet by mouth daily. 90 tablet 3   Budeson-Glycopyrrol-Formoterol (BREZTRI AEROSPHERE) 160-9-4.8 MCG/ACT AERO Inhale 2 puffs into the lungs 2 (two) times daily. 10.7 g 11   buPROPion (WELLBUTRIN XL) 300 MG 24 hr tablet Take 1 tablet (300 mg total) by mouth daily. 90 tablet 3   ciclopirox (PENLAC) 8 % solution Apply topically at bedtime. Apply over nail and surrounding skin daily 6.6 mL 1   fenofibrate (TRICOR) 145 MG tablet TAKE 1 TABLET BY MOUTH EVERY DAY 30 tablet 3   finasteride (PROSCAR) 5 MG tablet Take 1 tablet by mouth daily. 90 tablet 2   ketoconazole (NIZORAL) 2 % cream Apply topically 2 (two) times daily.     meloxicam (MOBIC) 15 MG tablet Take 1 tablet (15 mg total) by mouth daily. 30 tablet 0   Multiple Vitamins-Minerals (MENS MULTIVITAMIN) TABS Take by mouth. Take 1 tablet by mouth every day     pantoprazole (PROTONIX) 40 MG tablet Take 1 tablet (40 mg total) by mouth daily. Annual appt due in Oct must see provider for future refills 90 tablet 1   sildenafil (VIAGRA) 100 MG tablet TAKE 1 TABLET(100 MG) BY MOUTH DAILY AS NEEDED FOR ERECTILE DYSFUNCTION 12 tablet 5   Testosterone Enanthate (XYOSTED) 75 MG/0.5ML SOAJ Inject 75 mg into the skin once a week. 1.96 mL 5   triamterene-hydrochlorothiazide (MAXZIDE-25) 37.5-25 MG tablet Take 1 tablet by mouth daily. 90 tablet 2   valsartan (DIOVAN) 160 MG tablet Take 1 tablet (160 mg total) by mouth daily. 30 tablet 11   acetaminophen-codeine (TYLENOL #4) 300-60 MG tablet Take 1  tablet by mouth every 12 (twelve) hours as needed for severe pain. for pain 60 tablet 2   zolpidem (AMBIEN) 10 MG tablet TAKE 1 TABLET BY MOUTH AT BEDTIME AS NEEDED 90 tablet 1   methylPREDNISolone (MEDROL DOSEPAK) 4 MG TBPK tablet As directed 21 tablet 0   No facility-administered medications prior to visit.    ROS: Review of Systems  Constitutional:  Negative for appetite change, fatigue and unexpected weight change.  HENT:  Negative for congestion, nosebleeds, sneezing, sore throat and trouble swallowing.   Eyes:  Negative for itching and visual disturbance.  Respiratory:  Negative for cough.   Cardiovascular:  Negative for chest pain, palpitations and leg swelling.  Gastrointestinal:  Negative for abdominal distention, blood in stool, diarrhea and nausea.  Genitourinary:  Negative for frequency and hematuria.  Musculoskeletal:  Positive for back pain. Negative for gait problem, joint swelling and neck pain.  Skin:  Negative for rash.  Neurological:  Negative for dizziness, tremors, speech difficulty and weakness.  Psychiatric/Behavioral:  Negative for agitation, dysphoric mood, sleep disturbance and suicidal ideas. The patient is nervous/anxious.     Objective:  BP 120/60 (BP Location: Left Arm, Patient Position: Sitting, Cuff Size: Large)   Pulse 67   Temp 98.3 F (36.8 C) (Oral)   Ht 6' (1.829 m)   Wt 201 lb (91.2 kg)   SpO2 97%  BMI 27.26 kg/m   BP Readings from Last 3 Encounters:  09/25/22 120/68  09/12/22 120/60  08/10/22 132/86    Wt Readings from Last 3 Encounters:  09/25/22 197 lb (89.4 kg)  09/12/22 201 lb (91.2 kg)  08/10/22 200 lb (90.7 kg)    Physical Exam Constitutional:      General: He is not in acute distress.    Appearance: Normal appearance. He is well-developed.     Comments: NAD  Eyes:     Conjunctiva/sclera: Conjunctivae normal.     Pupils: Pupils are equal, round, and reactive to light.  Neck:     Thyroid: No thyromegaly.     Vascular:  No JVD.  Cardiovascular:     Rate and Rhythm: Normal rate and regular rhythm.     Heart sounds: Normal heart sounds. No murmur heard.    No friction rub. No gallop.  Pulmonary:     Effort: Pulmonary effort is normal. No respiratory distress.     Breath sounds: Normal breath sounds. No wheezing or rales.  Chest:     Chest wall: No tenderness.  Abdominal:     General: Bowel sounds are normal. There is no distension.     Palpations: Abdomen is soft. There is no mass.     Tenderness: There is no abdominal tenderness. There is no guarding or rebound.  Musculoskeletal:        General: Tenderness present. Normal range of motion.     Cervical back: Normal range of motion.  Lymphadenopathy:     Cervical: No cervical adenopathy.  Skin:    General: Skin is warm and dry.     Findings: No rash.  Neurological:     Mental Status: He is alert and oriented to person, place, and time.     Cranial Nerves: No cranial nerve deficit.     Motor: No abnormal muscle tone.     Coordination: Coordination normal.     Gait: Gait normal.     Deep Tendon Reflexes: Reflexes are normal and symmetric.  Psychiatric:        Behavior: Behavior normal.        Thought Content: Thought content normal.        Judgment: Judgment normal.     Lab Results  Component Value Date   WBC 10.2 05/31/2022   HGB 17.4 (H) 05/31/2022   HCT 51.1 05/31/2022   PLT 281.0 05/31/2022   GLUCOSE 109 (H) 05/31/2022   CHOL 190 05/31/2022   TRIG 144.0 05/31/2022   HDL 57.80 05/31/2022   LDLDIRECT 96.0 05/30/2020   LDLCALC 103 (H) 05/31/2022   ALT 18 05/31/2022   AST 28 05/31/2022   NA 137 05/31/2022   K 4.4 05/31/2022   CL 99 05/31/2022   CREATININE 0.95 05/31/2022   BUN 25 (H) 05/31/2022   CO2 30 05/31/2022   TSH 1.63 05/31/2022   PSA 0.16 05/31/2022    CT Chest Wo Contrast  Result Date: 12/24/2021 CLINICAL DATA:  Abnormal xray - lung nodule, < 1 cm, mod-high risk Abnormal chest x-ray with a questioned left lung  nodule. Thank you EXAM: CT CHEST WITHOUT CONTRAST TECHNIQUE: Multidetector CT imaging of the chest was performed following the standard protocol without IV contrast. RADIATION DOSE REDUCTION: This exam was performed according to the departmental dose-optimization program which includes automated exposure control, adjustment of the mA and/or kV according to patient size and/or use of iterative reconstruction technique. COMPARISON:  Radiograph 12/11/2021. Included portions from cardiac CT 03/21/2018. Chest  CT 04/01/2014 reviewed FINDINGS: Cardiovascular: The heart is normal in size. Occasional coronary artery calcifications. No pericardial effusion. Minimal fusiform dilatation of the ascending aorta, maximal dimension 4 cm. Mild aortic atherosclerosis. Aberrant right subclavian artery, congenital variant, unchanged. Mediastinum/Nodes: No enlarged mediastinal lymph nodes. There is no obvious bulky hilar adenopathy on this unenhanced exam. Decompressed esophagus. No thyroid nodule. Lungs/Pleura: Chronic biapical pleuroparenchymal scarring. Mild emphysema. Chronic scarring in the anterior left upper lobe with focal bronchiectasis. No pulmonary mass or suspicious nodule. Particularly, no nodule in the left lung corresponding to that questioned on radiograph. No pleural effusion. The trachea and central bronchi are patent. Upper Abdomen: Simple cyst arising from the upper pole of the right kidney was also seen on prior exam. No specific imaging follow-up is recommended. No acute upper abdominal findings. Musculoskeletal: There is spurring at the left first rib costochondral junction that may account for radiographic abnormality. There are no acute or suspicious osseous abnormalities. Mild scoliosis. There is mild ossification of the posterior longitudinal ligament from the T8-T9 through T10-T11 level causing mild narrowing of the spinal canal. No chest wall soft tissue abnormalities. IMPRESSION: 1. No pulmonary mass or  suspicious nodule. Particularly, no nodule in the left lung corresponding to that questioned on radiograph. 2. Minimal emphysema with biapical pleuroparenchymal scarring. Stable scarring in the anterior left upper lobe. Aortic Atherosclerosis (ICD10-I70.0) and Emphysema (ICD10-J43.9). Electronically Signed   By: Narda Rutherford M.D.   On: 12/24/2021 13:10    Assessment & Plan:   Problem List Items Addressed This Visit     Anxiety disorder - Primary    On Wellbutrin XL      Hypertension    Not taking Valsartan - re-start valsartan Dizzy w/Maxzide- take 1/2 tab daily Hydrate well      Coronary atherosclerosis    On Lipitor      Hypogonadism in male    Will try testosterone inj IM  Potential benefits of a long term sex steroid  use as well as potential risks  and complications were explained to the patient and were aknowledged.         Meds ordered this encounter  Medications   acetaminophen-codeine (TYLENOL #4) 300-60 MG tablet    Sig: Take 1 tablet by mouth every 12 (twelve) hours as needed for severe pain. for pain    Dispense:  60 tablet    Refill:  2   zolpidem (AMBIEN) 10 MG tablet    Sig: TAKE 1 TABLET BY MOUTH AT BEDTIME AS NEEDED    Dispense:  90 tablet    Refill:  1      Follow-up: Return in about 3 months (around 12/13/2022) for a follow-up visit.  Sonda Primes, MD

## 2022-09-12 NOTE — Assessment & Plan Note (Signed)
On Lipitor 

## 2022-09-12 NOTE — Assessment & Plan Note (Signed)
Will try testosterone inj IM  Potential benefits of a long term sex steroid  use as well as potential risks  and complications were explained to the patient and were aknowledged.   

## 2022-09-19 ENCOUNTER — Encounter: Payer: Self-pay | Admitting: Family Medicine

## 2022-09-24 ENCOUNTER — Ambulatory Visit: Payer: BC Managed Care – PPO | Admitting: Family Medicine
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
## 2022-09-25 ENCOUNTER — Encounter: Payer: Self-pay | Admitting: Family Medicine

## 2022-09-25 ENCOUNTER — Other Ambulatory Visit: Payer: Self-pay

## 2022-09-25 ENCOUNTER — Ambulatory Visit: Payer: BC Managed Care – PPO | Admitting: Family Medicine

## 2022-09-25 VITALS — BP 120/68 | HR 71 | Ht 72.0 in | Wt 197.0 lb

## 2022-09-25 DIAGNOSIS — S43432A Superior glenoid labrum lesion of left shoulder, initial encounter: Secondary | ICD-10-CM

## 2022-09-25 DIAGNOSIS — G8929 Other chronic pain: Secondary | ICD-10-CM

## 2022-09-25 DIAGNOSIS — M25512 Pain in left shoulder: Secondary | ICD-10-CM

## 2022-09-25 NOTE — Assessment & Plan Note (Signed)
Still some very mild hypoechoic changes of the glenohumeral joint but does seem to be making progress at the moment.  Discussed which activities to do and which ones to avoid.  Increase activity slowly otherwise.  Follow-up again in 6 to 8 weeks.

## 2022-09-25 NOTE — Patient Instructions (Signed)
Good to see you Shoulder will be fine Keep hands in peripheral vision Ok to golf only if you win See me in 2 months

## 2022-09-26 ENCOUNTER — Encounter: Payer: Self-pay | Admitting: Internal Medicine

## 2022-09-27 NOTE — Telephone Encounter (Signed)
Noted../lmb 

## 2022-10-04 DIAGNOSIS — Z808 Family history of malignant neoplasm of other organs or systems: Secondary | ICD-10-CM | POA: Diagnosis not present

## 2022-10-04 DIAGNOSIS — Z85828 Personal history of other malignant neoplasm of skin: Secondary | ICD-10-CM | POA: Diagnosis not present

## 2022-10-04 DIAGNOSIS — D225 Melanocytic nevi of trunk: Secondary | ICD-10-CM | POA: Diagnosis not present

## 2022-10-04 DIAGNOSIS — L820 Inflamed seborrheic keratosis: Secondary | ICD-10-CM | POA: Diagnosis not present

## 2022-10-04 DIAGNOSIS — L821 Other seborrheic keratosis: Secondary | ICD-10-CM | POA: Diagnosis not present

## 2022-10-04 DIAGNOSIS — L570 Actinic keratosis: Secondary | ICD-10-CM | POA: Diagnosis not present

## 2022-10-09 ENCOUNTER — Ambulatory Visit: Payer: BC Managed Care – PPO | Admitting: Family Medicine

## 2022-10-11 ENCOUNTER — Encounter: Payer: Self-pay | Admitting: Internal Medicine

## 2022-10-16 ENCOUNTER — Other Ambulatory Visit: Payer: Self-pay | Admitting: Internal Medicine

## 2022-10-16 MED ORDER — TESTOSTERONE CYPIONATE 200 MG/ML IM SOLN: 100.0000 mg | INTRAMUSCULAR | 3 refills | Status: DC

## 2022-10-16 MED ORDER — "SYRINGE/NEEDLE (DISP) 23G X 1"" 3 ML MISC": 0 refills | Status: DC

## 2022-10-25 ENCOUNTER — Encounter: Payer: Self-pay | Admitting: Nurse Practitioner

## 2022-10-25 ENCOUNTER — Ambulatory Visit (INDEPENDENT_AMBULATORY_CARE_PROVIDER_SITE_OTHER): Payer: BC Managed Care – PPO | Admitting: Pulmonary Disease

## 2022-10-25 ENCOUNTER — Ambulatory Visit: Payer: BC Managed Care – PPO | Admitting: Nurse Practitioner

## 2022-10-25 VITALS — BP 128/72 | HR 75 | Ht 72.0 in | Wt 191.0 lb

## 2022-10-25 DIAGNOSIS — J399 Disease of upper respiratory tract, unspecified: Secondary | ICD-10-CM | POA: Diagnosis not present

## 2022-10-25 DIAGNOSIS — J383 Other diseases of vocal cords: Secondary | ICD-10-CM | POA: Diagnosis not present

## 2022-10-25 DIAGNOSIS — E079 Disorder of thyroid, unspecified: Secondary | ICD-10-CM

## 2022-10-25 DIAGNOSIS — R053 Chronic cough: Secondary | ICD-10-CM

## 2022-10-25 DIAGNOSIS — R490 Dysphonia: Secondary | ICD-10-CM | POA: Diagnosis not present

## 2022-10-25 DIAGNOSIS — J449 Chronic obstructive pulmonary disease, unspecified: Secondary | ICD-10-CM | POA: Insufficient documentation

## 2022-10-25 DIAGNOSIS — J4520 Mild intermittent asthma, uncomplicated: Secondary | ICD-10-CM

## 2022-10-25 NOTE — Assessment & Plan Note (Signed)
Right lobe greater than left lobe upon palpation.  See above plan.

## 2022-10-25 NOTE — Assessment & Plan Note (Signed)
See above plan. 
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
## 2022-10-25 NOTE — Patient Instructions (Signed)
Full PFT performed today. °

## 2022-10-25 NOTE — Assessment & Plan Note (Signed)
Very mild obstruction with moderate diffusing defect. No dyspnea burden. Cough seems to be more so upper airway in nature given sensation of throat irritation prior to urge to cough, voice hoarseness, and changes in voice quality/pitch. He has concern for fixed upper airway obstruction on flow volume loop. He also has asymmetry to his thyroid. Will obtain CT imaging of the neck/soft tissues. Referral to ENT for direct laryngoscopy dependent on CT imaging results.   Patient Instructions  Continue Albuterol inhaler 2 puffs every 6 hours as needed for shortness of breath or wheezing. Notify if symptoms persist despite rescue inhaler/neb use. Continue pantoprazole 1 tab daily   Referral to Ear, Nose, and Throat  CT neck - someone will contact you for scheduling  Pulmonary function testing showed very mild COPD with a moderate gas exchange defect, which is likely reflective of your emphysema. We will monitor your symptoms and if you start having trouble with your breathing or flare ups, we will start a daily inhaler   Follow up in 4 months with Dr. Tonia [name] or Paul Addison [name],NP. If symptoms do not improve or worsen, please contact office for sooner follow up or seek emergency care.

## 2022-10-25 NOTE — Progress Notes (Signed)
Full PFT performed today. °

## 2022-10-25 NOTE — Assessment & Plan Note (Signed)
Very mild obstruction without reversibility with FEV 105, ratio 69. Moderate diffusing defect.  No issues with dyspnea.  Discussed the role of maintenance therapy.  He would prefer to hold off for right now.  He will continue to use albuterol as needed.  Will notify if he is using this on a consistent basis or has any recurrent exacerbations.  Encouraged to remain active.  See above plan.

## 2022-10-25 NOTE — Patient Instructions (Addendum)
Continue Albuterol inhaler 2 puffs every 6 hours as needed for shortness of breath or wheezing. Notify if symptoms persist despite rescue inhaler/neb use. Continue pantoprazole 1 tab daily   Referral to Ear, Nose, and Throat  CT neck - someone will contact you for scheduling  Pulmonary function testing showed very mild COPD with a moderate gas exchange defect, which is likely reflective of your emphysema. We will monitor your symptoms and if you start having trouble with your breathing or flare ups, we will start a daily inhaler   Follow up in 4 months with Dr. Tonia Brooms or Florentina Addison Taelar Gronewold,NP. If symptoms do not improve or worsen, please contact office for sooner follow up or seek emergency care.

## 2022-10-29 ENCOUNTER — Ambulatory Visit
Admission: RE | Admit: 2022-10-29 | Discharge: 2022-10-29 | Disposition: A | Discharge status: Home / Self Care | Payer: BC Managed Care – PPO | Source: Ambulatory Visit | Attending: Nurse Practitioner | Admitting: Nurse Practitioner

## 2022-10-29 DIAGNOSIS — J383 Other diseases of vocal cords: Secondary | ICD-10-CM

## 2022-10-29 DIAGNOSIS — J399 Disease of upper respiratory tract, unspecified: Secondary | ICD-10-CM

## 2022-10-29 DIAGNOSIS — J387 Other diseases of larynx: Secondary | ICD-10-CM | POA: Diagnosis not present

## 2022-10-29 LAB — PULMONARY FUNCTION TEST
DL/VA % pred: 121 %
DL/VA: 4.73 ml/min/mmHg/L
DLCO cor % pred: 53 %
DLCO cor: 14.06 ml/min/mmHg
DLCO unc % pred: 53 %
DLCO unc: 14.06 ml/min/mmHg
FEF 25-75 Post: 2.75 L/s
FEF 25-75 Pre: 2.91 L/s
FEF2575-%Change-Post: -5 %
FEF2575-%Pred-Post: 121 %
FEF2575-%Pred-Pre: 128 %
FEV1-%Change-Post: -3 %
FEV1-%Pred-Post: 100 %
FEV1-%Pred-Pre: 103 %
FEV1-Post: 3.21 L
FEV1-Pre: 3.33 L
FEV1FVC-%Change-Post: -2 %
FEV1FVC-%Pred-Pre: 98 %
FEV6-%Change-Post: 0 %
FEV6-%Pred-Post: 108 %
FEV6-%Pred-Pre: 109 %
FEV6-Post: 4.55 L
FEV6-Pre: 4.57 L
FEV6FVC-%Change-Post: 0 %
FEV6FVC-%Pred-Post: 106 %
FEV6FVC-%Pred-Pre: 105 %
FVC-%Change-Post: -1 %
FVC-%Pred-Post: 104 %
FVC-%Pred-Pre: 105 %
FVC-Post: 4.65 L
Post FEV1/FVC ratio: 69 %
Post FEV6/FVC ratio: 100 %
Pre FEV1/FVC ratio: 71 %
Pre FEV6/FVC Ratio: 99 %
RV % pred: 57 %
RV: 1.55 L
TLC % pred: 117 %
TLC: 8.74 L

## 2022-11-01 ENCOUNTER — Telehealth: Payer: Self-pay

## 2022-11-01 NOTE — Telephone Encounter (Signed)
*  Primary  Pharmacy Patient Advocate Encounter  Received notification from  FAX  that Prior Authorization for testosterone has been CANCELLED due to patient pays out of pocket due to medication not being covered.
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
## 2022-11-06 ENCOUNTER — Ambulatory Visit: Payer: BC Managed Care – PPO | Admitting: Family Medicine

## 2022-11-06 ENCOUNTER — Other Ambulatory Visit: Payer: Self-pay

## 2022-11-06 ENCOUNTER — Encounter: Payer: Self-pay | Admitting: Family Medicine

## 2022-11-06 VITALS — BP 118/64 | HR 85 | Ht 72.0 in | Wt 195.0 lb

## 2022-11-06 DIAGNOSIS — S43432A Superior glenoid labrum lesion of left shoulder, initial encounter: Secondary | ICD-10-CM | POA: Diagnosis not present

## 2022-11-06 DIAGNOSIS — M25512 Pain in left shoulder: Secondary | ICD-10-CM | POA: Diagnosis not present

## 2022-11-06 DIAGNOSIS — G8929 Other chronic pain: Secondary | ICD-10-CM | POA: Diagnosis not present

## 2022-11-06 NOTE — Patient Instructions (Signed)
You made my day  So much better! Still a little healing to go but almost done  See me again in 10-12 weeks if not perfect!

## 2022-11-06 NOTE — Assessment & Plan Note (Signed)
PRP is seem to make a significant difference at this time.  3 months out at the moment.  Improvement in range of motion.  I obtain and not believe actually at the amount of improvement already.  Follow-up with me again in 3 months.  No significant restrictions whatsoever.

## 2022-11-08 DIAGNOSIS — J399 Disease of upper respiratory tract, unspecified: Secondary | ICD-10-CM

## 2022-11-08 DIAGNOSIS — R053 Chronic cough: Secondary | ICD-10-CM

## 2022-11-13 ENCOUNTER — Encounter: Payer: Self-pay | Admitting: Pulmonary Disease

## 2022-11-16 ENCOUNTER — Other Ambulatory Visit: Payer: Self-pay | Admitting: Internal Medicine

## 2022-11-16 ENCOUNTER — Other Ambulatory Visit: Payer: Self-pay

## 2022-11-16 MED ORDER — ATORVASTATIN CALCIUM 10 MG PO TABS: 10.0000 mg | ORAL_TABLET | Freq: Every day | ORAL | 3 refills | Status: DC

## 2022-12-16 ENCOUNTER — Other Ambulatory Visit: Payer: Self-pay | Admitting: Internal Medicine

## 2022-12-17 ENCOUNTER — Ambulatory Visit: Payer: BC Managed Care – PPO | Admitting: Internal Medicine

## 2022-12-17 ENCOUNTER — Encounter: Payer: Self-pay | Admitting: Internal Medicine

## 2022-12-17 VITALS — BP 146/80 | HR 64 | Temp 97.9°F | Ht 72.0 in | Wt 197.6 lb

## 2022-12-17 DIAGNOSIS — H9319 Tinnitus, unspecified ear: Secondary | ICD-10-CM | POA: Insufficient documentation

## 2022-12-17 DIAGNOSIS — R053 Chronic cough: Secondary | ICD-10-CM | POA: Diagnosis not present

## 2022-12-17 DIAGNOSIS — H9313 Tinnitus, bilateral: Secondary | ICD-10-CM | POA: Diagnosis not present

## 2022-12-17 DIAGNOSIS — Z23 Encounter for immunization: Secondary | ICD-10-CM

## 2022-12-17 MED ORDER — BENZONATATE 200 MG PO CAPS: 200.0000 mg | ORAL_CAPSULE | Freq: Three times a day (TID) | ORAL | 3 refills | Status: DC | PRN

## 2022-12-17 NOTE — Progress Notes (Signed)
Subjective:  Patient ID: Paul Blazer., male    DOB: 1946-02-06  Age: 77 y.o. MRN: [id number]  CC: Medical Management of Chronic Issues (3 month f/u )   HPI Paul Blazer. presents for cough - pt stopped T#4. Seeing Dr Tonia Brooms (Pulm) F/u LBP C/o tinnitus  Outpatient Medications Prior to Visit  Medication Sig Dispense Refill   albuterol (VENTOLIN HFA) 108 (90 Base) MCG/ACT inhaler Inhale 2 puffs into the lungs every 6 (six) hours as needed for wheezing or shortness of breath. 8 g 6   atorvastatin (LIPITOR) 10 MG tablet Take 1 tablet (10 mg total) by mouth daily. 90 tablet 3   buPROPion (WELLBUTRIN XL) 300 MG 24 hr tablet Take 1 tablet (300 mg total) by mouth daily. 90 tablet 3   ciclopirox (PENLAC) 8 % solution Apply topically at bedtime. Apply over nail and surrounding skin daily 6.6 mL 1   fenofibrate (TRICOR) 145 MG tablet TAKE 1 TABLET BY MOUTH EVERY DAY 30 tablet 3   finasteride (PROSCAR) 5 MG tablet Take 1 tablet by mouth daily. 90 tablet 2   ketoconazole (NIZORAL) 2 % cream Apply topically 2 (two) times daily.     Multiple Vitamins-Minerals (MENS MULTIVITAMIN) TABS Take by mouth. Take 1 tablet by mouth every day     pantoprazole (PROTONIX) 40 MG tablet Take 1 tablet (40 mg total) by mouth daily. Annual appt due in Oct must see provider for future refills 90 tablet 1   SYRINGE-NEEDLE, DISP, 3 ML 23G X 1" 3 ML MISC As directed IM weekly 50 each 0   testosterone cypionate (DEPOTESTOSTERONE CYPIONATE) 200 MG/ML injection Inject 0.5 mLs (100 mg total) into the muscle once a week. 10 mL 3   triamterene-hydrochlorothiazide (MAXZIDE-25) 37.5-25 MG tablet Take 1 tablet by mouth daily. 90 tablet 2   valsartan (DIOVAN) 160 MG tablet Take 1 tablet (160 mg total) by mouth daily. 30 tablet 11   zolpidem (AMBIEN) 10 MG tablet TAKE 1 TABLET BY MOUTH AT BEDTIME AS NEEDED 90 tablet 1   acetaminophen-codeine (TYLENOL #4) 300-60 MG tablet Take 1 tablet by mouth every 12 (twelve) hours as  needed for severe pain. for pain (Patient not taking: Reported on 10/25/2022) 60 tablet 2   Budeson-Glycopyrrol-Formoterol (BREZTRI AEROSPHERE) 160-9-4.8 MCG/ACT AERO Inhale 2 puffs into the lungs 2 (two) times daily. (Patient not taking: Reported on 10/25/2022) 10.7 g 11   sildenafil (VIAGRA) 100 MG tablet TAKE 1 TABLET(100 MG) BY MOUTH DAILY AS NEEDED FOR ERECTILE DYSFUNCTION (Patient not taking: Reported on 12/17/2022) 12 tablet 5   Testosterone Enanthate (XYOSTED) 75 MG/0.5ML SOAJ Inject 75 mg into the skin once a week. (Patient not taking: Reported on 12/17/2022) 1.96 mL 5   No facility-administered medications prior to visit.    ROS: Review of Systems  Constitutional:  Negative for appetite change, fatigue and unexpected weight change.  HENT:  Positive for tinnitus. Negative for congestion, nosebleeds, sneezing, sore throat and trouble swallowing.   Eyes:  Negative for itching and visual disturbance.  Respiratory:  Positive for cough.   Cardiovascular:  Negative for chest pain, palpitations and leg swelling.  Gastrointestinal:  Negative for abdominal distention, blood in stool, diarrhea and nausea.  Genitourinary:  Negative for frequency and hematuria.  Musculoskeletal:  Positive for arthralgias, back pain, neck pain and neck stiffness. Negative for gait problem and joint swelling.  Skin:  Negative for rash.  Neurological:  Negative for dizziness, tremors, speech difficulty and weakness.  Psychiatric/Behavioral:  Negative  for agitation, dysphoric mood and sleep disturbance. The patient is not nervous/anxious.     Objective:  BP (!) 150/86 (BP Location: Left Arm, Patient Position: Sitting, Cuff Size: Normal)   Pulse 64   Temp 97.9 F (36.6 C) (Oral)   Ht 6' (1.829 m)   Wt 197 lb 9.6 oz (89.6 kg)   SpO2 98%   BMI 26.80 kg/m   BP Readings from Last 3 Encounters:  12/17/22 (!) 150/86  11/06/22 118/64  10/25/22 128/72    Wt Readings from Last 3 Encounters:  12/17/22 197 lb 9.6  oz (89.6 kg)  11/06/22 195 lb (88.5 kg)  10/25/22 191 lb (86.6 kg)    Physical Exam Constitutional:      General: He is not in acute distress.    Appearance: Normal appearance. He is well-developed.     Comments: NAD  Eyes:     Conjunctiva/sclera: Conjunctivae normal.     Pupils: Pupils are equal, round, and reactive to light.  Neck:     Thyroid: No thyromegaly.     Vascular: No JVD.  Cardiovascular:     Rate and Rhythm: Normal rate and regular rhythm.     Heart sounds: Normal heart sounds. No murmur heard.    No friction rub. No gallop.  Pulmonary:     Effort: Pulmonary effort is normal. No respiratory distress.     Breath sounds: Normal breath sounds. No wheezing or rales.  Chest:     Chest wall: No tenderness.  Abdominal:     General: Bowel sounds are normal. There is no distension.     Palpations: Abdomen is soft. There is no mass.     Tenderness: There is no abdominal tenderness. There is no guarding or rebound.  Musculoskeletal:        General: No tenderness. Normal range of motion.     Cervical back: Normal range of motion.     Right lower leg: No edema.     Left lower leg: No edema.  Lymphadenopathy:     Cervical: No cervical adenopathy.  Skin:    General: Skin is warm and dry.     Findings: No rash.  Neurological:     Mental Status: He is alert and oriented to person, place, and time.     Cranial Nerves: No cranial nerve deficit.     Motor: No abnormal muscle tone.     Coordination: Coordination normal.     Gait: Gait normal.     Deep Tendon Reflexes: Reflexes are normal and symmetric.  Psychiatric:        Behavior: Behavior normal.        Thought Content: Thought content normal.        Judgment: Judgment normal.   LS and C spine - pain w/ROM  Lab Results  Component Value Date   WBC 10.2 05/31/2022   HGB 17.4 (H) 05/31/2022   HCT 51.1 05/31/2022   PLT 281.0 05/31/2022   GLUCOSE 109 (H) 05/31/2022   CHOL 190 05/31/2022   TRIG 144.0 05/31/2022    HDL 57.80 05/31/2022   LDLDIRECT 96.0 05/30/2020   LDLCALC 103 (H) 05/31/2022   ALT 18 05/31/2022   AST 28 05/31/2022   NA 137 05/31/2022   K 4.4 05/31/2022   CL 99 05/31/2022   CREATININE 0.95 05/31/2022   BUN 25 (H) 05/31/2022   CO2 30 05/31/2022   TSH 1.63 05/31/2022   PSA 0.16 05/31/2022    CT SOFT TISSUE NECK WO CONTRAST  Result Date: 11/13/2022 CLINICAL DATA:  77 year old male "vocal cord disease, vocal cord nodule or polyp". Patient reports having chronic issues with upper airway. EXAM: CT NECK WITHOUT CONTRAST TECHNIQUE: Multidetector CT imaging of the neck was performed following the standard protocol without intravenous contrast. RADIATION DOSE REDUCTION: This exam was performed according to the departmental dose-optimization program which includes automated exposure control, adjustment of the mA and/or kV according to patient size and/or use of iterative reconstruction technique. COMPARISON:  Chest CT 12/22/2021. FINDINGS: Pharynx and larynx: The glottis is closed on this noncontrast exam. There is no laryngeal asymmetry. No evidence of discrete laryngeal polyp or mass in the absence of IV contrast. Pharyngeal soft tissue contours are within normal limits. Noncontrast parapharyngeal and retropharyngeal spaces are within normal limits. Salivary glands: Negative noncontrast appearance. Thyroid: Negative. Lymph nodes: No cervical lymphadenopathy. Vascular: Vascular patency is not evaluated in the absence of IV contrast. Left carotid calcified atherosclerosis. Limited intracranial: Negative. Visualized orbits: Postoperative changes to both globes, otherwise negative. Mastoids and visualized paranasal sinuses: Visualized paranasal sinuses and mastoids are well aerated. Skeleton: Cervical spine fusion C3 through C7. ACDF hardware remains in place C3 through C5. Solid-appearing arthrodesis throughout. No acute or suspicious osseous lesion. Upper chest: Calcified aortic atherosclerosis. Aberrant  origin of the right subclavian artery (normal variant). Mild apical lung scarring. No lymphadenopathy in the visible superior mediastinum. IMPRESSION: 1. Negative noncontrast CT appearance of the larynx and neck. No discrete laryngeal or pharyngeal mass or cervical lymphadenopathy identified. 2. Previous cervical spine fusion C3 through C7. 3.  Aortic Atherosclerosis (ICD10-I70.0). Electronically Signed   By: Odessa Fleming M.D.   On: 11/13/2022 14:31    Assessment & Plan:   Problem List Items Addressed This Visit     Cough    Chronic cough - pt stopped T#4. Seeing Dr Tonia Brooms Erline Hau) Will try [facility] ENT ref is pending      Tinnitus   Other Visit Diagnoses     Need for immunization against influenza    -  Primary   Relevant Orders   Flu Vaccine Trivalent High Dose (Fluad)         Meds ordered this encounter  Medications   benzonatate (TESSALON) 200 MG capsule    Sig: Take 1 capsule (200 mg total) by mouth 3 (three) times daily as needed for cough.    Dispense:  90 capsule    Refill:  3      Follow-up: Return in about 3 months (around 03/19/2023) for a follow-up visit.  Sonda Primes, MD

## 2022-12-17 NOTE — Assessment & Plan Note (Signed)
Chronic cough - pt stopped T#4. Seeing Dr Tonia Brooms Erline Hau) Will try [facility] ENT ref is pending

## 2023-01-14 ENCOUNTER — Ambulatory Visit (INDEPENDENT_AMBULATORY_CARE_PROVIDER_SITE_OTHER): Payer: BC Managed Care – PPO | Admitting: Otolaryngology

## 2023-01-14 ENCOUNTER — Encounter (INDEPENDENT_AMBULATORY_CARE_PROVIDER_SITE_OTHER): Payer: Self-pay

## 2023-01-14 DIAGNOSIS — K219 Gastro-esophageal reflux disease without esophagitis: Secondary | ICD-10-CM

## 2023-01-14 DIAGNOSIS — R053 Chronic cough: Secondary | ICD-10-CM

## 2023-01-14 DIAGNOSIS — R490 Dysphonia: Secondary | ICD-10-CM

## 2023-01-14 DIAGNOSIS — R0989 Other specified symptoms and signs involving the circulatory and respiratory systems: Secondary | ICD-10-CM

## 2023-01-14 DIAGNOSIS — J383 Other diseases of vocal cords: Secondary | ICD-10-CM | POA: Diagnosis not present
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
## 2023-01-15 ENCOUNTER — Other Ambulatory Visit: Payer: Self-pay | Admitting: Internal Medicine
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
## 2023-01-16 ENCOUNTER — Encounter: Payer: Self-pay | Admitting: Family Medicine

## 2023-01-16 ENCOUNTER — Telehealth (INDEPENDENT_AMBULATORY_CARE_PROVIDER_SITE_OTHER): Payer: Self-pay

## 2023-01-16 ENCOUNTER — Ambulatory Visit: Payer: BC Managed Care – PPO | Admitting: Family Medicine

## 2023-01-16 ENCOUNTER — Other Ambulatory Visit: Payer: Self-pay

## 2023-01-16 VITALS — BP 142/82 | HR 76 | Ht 72.0 in | Wt 201.0 lb

## 2023-01-16 DIAGNOSIS — S43432A Superior glenoid labrum lesion of left shoulder, initial encounter: Secondary | ICD-10-CM

## 2023-01-16 DIAGNOSIS — G8929 Other chronic pain: Secondary | ICD-10-CM | POA: Diagnosis not present

## 2023-01-16 DIAGNOSIS — M25512 Pain in left shoulder: Secondary | ICD-10-CM

## 2023-01-16 NOTE — Assessment & Plan Note (Signed)
Patient has made exceptional improvement at this time.  Discussed with patient that he can do anything without any restrictions.  Increase activity slowly.  Follow-up with me as needed

## 2023-01-16 NOTE — Patient Instructions (Signed)
See me as needed 

## 2023-01-25 ENCOUNTER — Telehealth (INDEPENDENT_AMBULATORY_CARE_PROVIDER_SITE_OTHER): Payer: Self-pay

## 2023-01-25 NOTE — Telephone Encounter (Signed)
Spoke with patient and gave speech therapy number [phone]. He said he will call them. I told if any problems to call me and I will call them for him.

## 2023-02-06 ENCOUNTER — Encounter: Payer: Self-pay | Admitting: Speech Pathology

## 2023-02-06 ENCOUNTER — Ambulatory Visit: Payer: BC Managed Care – PPO | Attending: Otolaryngology | Admitting: Speech Pathology

## 2023-02-06 ENCOUNTER — Other Ambulatory Visit: Payer: Self-pay

## 2023-02-06 DIAGNOSIS — R053 Chronic cough: Secondary | ICD-10-CM | POA: Insufficient documentation

## 2023-02-06 DIAGNOSIS — R0989 Other specified symptoms and signs involving the circulatory and respiratory systems: Secondary | ICD-10-CM | POA: Insufficient documentation

## 2023-02-06 DIAGNOSIS — R498 Other voice and resonance disorders: Secondary | ICD-10-CM | POA: Diagnosis not present

## 2023-02-06 NOTE — Patient Instructions (Addendum)
   Superior Laryngeal Nerve block for vocal cord dysfunction  Reflux Raft or Reflux Gourmet - stops the reflux from coming up   ABDOMINAL BREATHING FOR VCD   Shoulders down - this is a cue to relax Breathe in through your nose and fill your belly with air, watching your hand move outward Breathe out through your mouth and watch. An audible "sh"  may help   Now as you breathe out, get a picture of relaxing in your mind Feel the constant in-out of your breathing with your belly Picture the tension in your throat and chest evaporate like steam, melting away and FEEL it do so   Practice this throughout the day when you are not having symptoms. For example: in the car, when watching TV, before medications. Regular practice when you are feeling well is important.  Make it automatic and use it at the first sense of throat tightness to prevent or suppress the VCD. You may start with the inhale or exhale.    Be patient when completing the breathing. It may take several minutes to start feeling relief  Use the breathing to "pre-treat" yourself before a known trigger for VCD. Possible triggers could be: change in air temperature, strong odors, perfume, and exercise.   Provided by: Clinton Sawyer. SLP [phone]

## 2023-02-06 NOTE — Addendum Note (Signed)
Addended by: Clinton Sawyer A on: 02/06/2023 01:42 PM   Modules accepted: Orders
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
## 2023-02-25 ENCOUNTER — Encounter: Payer: Self-pay | Admitting: Speech Pathology

## 2023-02-25 ENCOUNTER — Ambulatory Visit: Payer: BC Managed Care – PPO | Attending: Otolaryngology | Admitting: Speech Pathology

## 2023-02-25 ENCOUNTER — Ambulatory Visit: Payer: BC Managed Care – PPO | Admitting: Nurse Practitioner

## 2023-02-25 DIAGNOSIS — R498 Other voice and resonance disorders: Secondary | ICD-10-CM | POA: Diagnosis not present
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
## 2023-02-25 NOTE — Patient Instructions (Addendum)
   Great job using warm sips and lozenges to suppress the cough  Try the sniff-sniff blow and some warm sips when your are on your way to meetings  to "pre-treat"  Do try to keep your throat open and relaxed when you are  trying to suppress or stop coughing   You have a lot of tools to help reduce this throat clear and cough - the sniff-blow, lozenges, warm sips, water sips, hard swallows - use all of them as needed  You can continue to work on this  If you can suppress throat clears with me you can do it any time  Sniff and blow relaxed throat on your way to work and on the way home  Softer voice is good - make sure at home you are not straining your voice to talk over the TV, dish washer music etc  Try not to talk when you are suppressing a throat clear or cough until the sensation has passed

## 2023-02-27 ENCOUNTER — Encounter: Payer: BC Managed Care – PPO | Admitting: Speech Pathology

## 2023-02-27 ENCOUNTER — Telehealth: Payer: BC Managed Care – PPO | Admitting: Nurse Practitioner

## 2023-02-27 DIAGNOSIS — J449 Chronic obstructive pulmonary disease, unspecified: Secondary | ICD-10-CM

## 2023-02-27 DIAGNOSIS — J4530 Mild persistent asthma, uncomplicated: Secondary | ICD-10-CM | POA: Diagnosis not present

## 2023-02-27 DIAGNOSIS — R058 Other specified cough: Secondary | ICD-10-CM | POA: Diagnosis not present

## 2023-02-27 DIAGNOSIS — E079 Disorder of thyroid, unspecified: Secondary | ICD-10-CM

## 2023-02-27 DIAGNOSIS — K2100 Gastro-esophageal reflux disease with esophagitis, without bleeding: Secondary | ICD-10-CM | POA: Diagnosis not present

## 2023-02-27 MED ORDER — SPACER/AERO-HOLDING CHAMBERS DEVI: 1 refills | Status: DC

## 2023-02-27 MED ORDER — BUDESONIDE-FORMOTEROL FUMARATE 80-4.5 MCG/ACT IN AERO: 2.0000 | INHALATION_SPRAY | Freq: Two times a day (BID) | RESPIRATORY_TRACT | 12 refills | Status: DC

## 2023-02-27 NOTE — Patient Instructions (Addendum)
Continue Albuterol inhaler 2 puffs every 6 hours as needed for shortness of breath or wheezing. Notify if symptoms persist despite rescue inhaler/neb use. Continue pantoprazole 1 tab daily    Start symbicort 2 puffs Twice daily. Brush tongue and rinse mouth afterwards. Use with spacer  Follow up with ENT as scheduled    Pulmonary function testing showed very mild COPD with a moderate gas exchange defect, which is likely reflective of your emphysema. We will monitor your symptoms and if you start having trouble with your breathing or flare ups, we will start a daily inhaler    Follow up in 4 months with Dr. Tonia Brooms or Florentina Addison Lucienne Sawyers,NP. If symptoms do not improve or worsen, please contact office for sooner follow up or seek emergency care.

## 2023-02-28 ENCOUNTER — Encounter: Payer: Self-pay | Admitting: Nurse Practitioner

## 2023-02-28 DIAGNOSIS — R058 Other specified cough: Secondary | ICD-10-CM | POA: Insufficient documentation

## 2023-02-28 NOTE — Assessment & Plan Note (Signed)
Well-controlled on PPI therapy. ?

## 2023-02-28 NOTE — Assessment & Plan Note (Signed)
Cough seems to be more so upper airway in nature given sensation of throat irritation prior to urge to cough, voice hoarseness, and changes in voice quality/pitch. No abnormal findings on CT neck. Direct laryngoscopy with some vocal cord atrophy. Referred to speech therapy. May consider treatment for neuropathic cough with gabapentin; however, patient is not very keen on this. Will continue with exercises and follow up with ENT as scheduled. Reviewed measures to limit upper airway irritation.   Patient Instructions  Continue Albuterol inhaler 2 puffs every 6 hours as needed for shortness of breath or wheezing. Notify if symptoms persist despite rescue inhaler/neb use. Continue pantoprazole 1 tab daily    Start symbicort 2 puffs Twice daily. Brush tongue and rinse mouth afterwards. Use with spacer  Follow up with ENT as scheduled    Pulmonary function testing showed very mild COPD with a moderate gas exchange defect, which is likely reflective of your emphysema. We will monitor your symptoms and if you start having trouble with your breathing or flare ups, we will start a daily inhaler    Follow up in 4 months with Dr. Tonia Brooms or Florentina Addison Breeanna Galgano,NP. If symptoms do not improve or worsen, please contact office for sooner follow up or seek emergency care.
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
## 2023-02-28 NOTE — Assessment & Plan Note (Addendum)
Very mild obstruction without reversibility with FEV 105, ratio 69. Moderate diffusing defect.  Minimal dyspnea.  Does receive benefit from rescue inhaler. Discussed the role of maintenance therapy.  Will retrial scheduled bronchodilators with Symbicort - provide with spacer to limit upper airway irritation. Side effect profile reviewed. Educated on proper use. Continue PRN SABA. Encouraged to remain active.  See above plan.

## 2023-02-28 NOTE — Assessment & Plan Note (Signed)
No abnormalities on CT neck. Advised to follow up with PCP.

## 2023-03-16 ENCOUNTER — Other Ambulatory Visit: Payer: Self-pay | Admitting: Internal Medicine

## 2023-03-18 ENCOUNTER — Encounter: Payer: BC Managed Care – PPO | Admitting: Speech Pathology

## 2023-03-20 ENCOUNTER — Encounter: Payer: BC Managed Care – PPO | Admitting: Speech Pathology

## 2023-03-25 ENCOUNTER — Encounter: Payer: BC Managed Care – PPO | Admitting: Speech Pathology

## 2023-03-27 ENCOUNTER — Encounter: Payer: BC Managed Care – PPO | Admitting: Speech Pathology

## 2023-03-31 ENCOUNTER — Encounter: Payer: Self-pay | Admitting: Internal Medicine

## 2023-04-01 ENCOUNTER — Other Ambulatory Visit: Payer: Self-pay

## 2023-04-01 MED ORDER — PANTOPRAZOLE SODIUM 40 MG PO TBEC: 40.0000 mg | DELAYED_RELEASE_TABLET | Freq: Every day | ORAL | 1 refills | Status: DC

## 2023-04-16 ENCOUNTER — Ambulatory Visit: Payer: BC Managed Care – PPO | Admitting: Internal Medicine

## 2023-04-16 ENCOUNTER — Encounter: Payer: Self-pay | Admitting: Internal Medicine

## 2023-04-16 VITALS — BP 110/70 | HR 71 | Temp 98.0°F | Ht 72.0 in | Wt 202.0 lb

## 2023-04-16 DIAGNOSIS — E785 Hyperlipidemia, unspecified: Secondary | ICD-10-CM | POA: Diagnosis not present

## 2023-04-16 DIAGNOSIS — F419 Anxiety disorder, unspecified: Secondary | ICD-10-CM | POA: Diagnosis not present

## 2023-04-16 DIAGNOSIS — R413 Other amnesia: Secondary | ICD-10-CM | POA: Diagnosis not present

## 2023-04-16 MED ORDER — BUPROPION HCL ER (XL) 150 MG PO TB24: 150.0000 mg | ORAL_TABLET | Freq: Every day | ORAL | 1 refills | Status: DC

## 2023-04-16 MED ORDER — ZOLPIDEM TARTRATE 10 MG PO TABS: ORAL_TABLET | ORAL | 1 refills | Status: DC

## 2023-04-16 NOTE — Assessment & Plan Note (Signed)
Hold Lipitor and Fenofibrate due to c/o memory issues according to his wife x months.Paul KitchenMarland [name]

## 2023-04-16 NOTE — Addendum Note (Signed)
Addended by: Tresa Garter on: 04/16/2023 12:31 PM   Modules accepted: Level of Service

## 2023-04-16 NOTE — Patient Instructions (Addendum)
Sleep 7 h/night  Hold Lipitor and Fenofibrate

## 2023-04-16 NOTE — Progress Notes (Addendum)
 Subjective:  Patient ID: Paul [name]., male    DOB: January 31, 1946  Age: 78 y.o. MRN: [id number]  CC: Medical Management of Chronic Issues (3 mnth f/u)   HPI Albaraa Swingle. presents for memory issues according to his wife x months... Not getting enough  Pt is c/o ADD sx's all his life Pt stopped T#3  Sleeping 4-6 hrs at night C/o tremor at times  Outpatient Medications Prior to Visit  Medication Sig Dispense Refill   albuterol  (VENTOLIN  HFA) 108 (90 Base) MCG/ACT inhaler Inhale 2 puffs into the lungs every 6 (six) hours as needed for wheezing or shortness of breath. 8 g 6   atorvastatin  (LIPITOR) 10 MG tablet Take 1 tablet (10 mg total) by mouth daily. 90 tablet 3   budesonide -formoterol  (SYMBICORT ) 80-4.5 MCG/ACT inhaler Inhale 2 puffs into the lungs 2 (two) times daily. 1 each 12   fenofibrate  (TRICOR ) 145 MG tablet TAKE 1 TABLET BY MOUTH EVERY DAY 30 tablet 3   finasteride  (PROSCAR ) 5 MG tablet Take 1 tablet by mouth daily. 90 tablet 1   ketoconazole (NIZORAL) 2 % cream Apply topically 2 (two) times daily.     Multiple Vitamins-Minerals (MENS MULTIVITAMIN) TABS Take by mouth. Take 1 tablet by mouth every day     pantoprazole  (PROTONIX ) 40 MG tablet Take 1 tablet (40 mg total) by mouth daily. Annual appt due in Oct must see provider for future refills 90 tablet 1   Spacer/Aero-Holding [facility] Use with Symbicort  inhaler 1 each 1   SYRINGE-NEEDLE, DISP, 3 ML 23G X 1 3 ML MISC As directed IM weekly 50 each 0   testosterone  cypionate (DEPOTESTOSTERONE CYPIONATE) 200 MG/ML injection Inject 0.5 mLs (100 mg total) into the muscle once a week. 10 mL 3   triamterene -hydrochlorothiazide  (MAXZIDE -25) 37.5-25 MG tablet Take 1 tablet by mouth daily. 90 tablet 1   valsartan  (DIOVAN ) 160 MG tablet Take 1 tablet (160 mg total) by mouth daily. 30 tablet 11   buPROPion  (WELLBUTRIN  XL) 300 MG 24 hr tablet Take 1 tablet by mouth daily. 90 tablet 1   zolpidem  (AMBIEN ) 10 MG tablet TAKE  1 TABLET BY MOUTH AT BEDTIME AS NEEDED 90 tablet 1   acetaminophen -codeine  (TYLENOL  #4) 300-60 MG tablet Take 1 tablet by mouth every 12 (twelve) hours as needed for severe pain. for pain (Patient not taking: Reported on 04/16/2023) 60 tablet 2   benzonatate  (TESSALON ) 200 MG capsule Take 1 capsule (200 mg total) by mouth 3 (three) times daily as needed for cough. (Patient not taking: Reported on 04/16/2023) 90 capsule 3   Budeson-Glycopyrrol-Formoterol  (BREZTRI  AEROSPHERE) 160-9-4.8 MCG/ACT AERO INHALE 2 PUFFS INTO THE LUNGS 2 TIMES DAILY 10.7 g 5   ciclopirox  (PENLAC ) 8 % solution Apply topically at bedtime. Apply over nail and surrounding skin daily 6.6 mL 1   sildenafil  (VIAGRA ) 100 MG tablet TAKE 1 TABLET(100 MG) BY MOUTH DAILY AS NEEDED FOR ERECTILE DYSFUNCTION 12 tablet 5   Testosterone  Enanthate (XYOSTED ) 75 MG/0.5ML SOAJ Inject 75 mg into the skin once a week. 1.96 mL 5   No facility-administered medications prior to visit.    ROS: Review of Systems  Constitutional:  Negative for appetite change, fatigue and unexpected weight change.  HENT:  Negative for congestion, nosebleeds, sneezing, sore throat and trouble swallowing.   Eyes:  Negative for itching and visual disturbance.  Respiratory:  Negative for cough.   Cardiovascular:  Negative for chest pain, palpitations and leg swelling.  Gastrointestinal:  Negative  for abdominal distention, blood in stool, diarrhea and nausea.  Genitourinary:  Negative for frequency and hematuria.  Musculoskeletal:  Negative for back pain, gait problem, joint swelling and neck pain.  Skin:  Negative for rash.  Neurological:  Negative for dizziness, tremors, speech difficulty and weakness.  Psychiatric/Behavioral:  Negative for agitation, dysphoric mood and sleep disturbance. The patient is not nervous/anxious.     Objective:  BP 110/70 (BP Location: Left Arm, Patient Position: Sitting, Cuff Size: Normal)   Pulse 71   Temp 98 F (36.7 C) (Oral)   Ht  6' (1.829 m)   Wt 202 lb (91.6 kg)   SpO2 97%   BMI 27.40 kg/m   BP Readings from Last 3 Encounters:  04/16/23 110/70  01/16/23 (!) 142/82  12/17/22 (!) 146/80    Wt Readings from Last 3 Encounters:  04/16/23 202 lb (91.6 kg)  01/16/23 201 lb (91.2 kg)  12/17/22 197 lb 9.6 oz (89.6 kg)    Physical Exam Constitutional:      General: He is not in acute distress.    Appearance: He is well-developed.     Comments: NAD  Eyes:     Conjunctiva/sclera: Conjunctivae normal.     Pupils: Pupils are equal, round, and reactive to light.  Neck:     Thyroid : No thyromegaly.     Vascular: No JVD.  Cardiovascular:     Rate and Rhythm: Normal rate and regular rhythm.     Heart sounds: Normal heart sounds. No murmur heard.    No friction rub. No gallop.  Pulmonary:     Effort: Pulmonary effort is normal. No respiratory distress.     Breath sounds: Normal breath sounds. No wheezing or rales.  Chest:     Chest wall: No tenderness.  Abdominal:     General: Bowel sounds are normal. There is no distension.     Palpations: Abdomen is soft. There is no mass.     Tenderness: There is no abdominal tenderness. There is no guarding or rebound.  Musculoskeletal:        General: No tenderness. Normal range of motion.     Cervical back: Normal range of motion.  Lymphadenopathy:     Cervical: No cervical adenopathy.  Skin:    General: Skin is warm and dry.     Findings: No rash.  Neurological:     Mental Status: He is alert and oriented to person, place, and time.     Cranial Nerves: No cranial nerve deficit.     Motor: No abnormal muscle tone.     Coordination: Coordination normal.     Gait: Gait normal.     Deep Tendon Reflexes: Reflexes are normal and symmetric.  Psychiatric:        Behavior: Behavior normal.        Thought Content: Thought content normal.        Judgment: Judgment normal.   A/o/c Neuro - nonfocal    A total time of 45 minutes was spent preparing to see the  patient, reviewing tests, x-rays, operative reports and other medical records.  Also, obtaining history and performing comprehensive physical exam.  Additionally, counseling the patient regarding the above listed issues - MCD, ?ADD.   Finally, documenting clinical information in the health records, coordination of care, educating the patient.  Lab Results  Component Value Date   WBC 10.2 05/31/2022   HGB 17.4 (H) 05/31/2022   HCT 51.1 05/31/2022   PLT 281.0 05/31/2022   GLUCOSE  109 (H) 05/31/2022   CHOL 190 05/31/2022   TRIG 144.0 05/31/2022   HDL 57.80 05/31/2022   LDLDIRECT 96.0 05/30/2020   LDLCALC 103 (H) 05/31/2022   ALT 18 05/31/2022   AST 28 05/31/2022   NA 137 05/31/2022   K 4.4 05/31/2022   CL 99 05/31/2022   CREATININE 0.95 05/31/2022   BUN 25 (H) 05/31/2022   CO2 30 05/31/2022   TSH 1.63 05/31/2022   PSA 0.16 05/31/2022    CT SOFT TISSUE NECK WO CONTRAST Result Date: 11/13/2022 CLINICAL DATA:  78 year old male vocal cord disease, vocal cord nodule or polyp. Patient reports having chronic issues with upper airway. EXAM: CT NECK WITHOUT CONTRAST TECHNIQUE: Multidetector CT imaging of the neck was performed following the standard protocol without intravenous contrast. RADIATION DOSE REDUCTION: This exam was performed according to the departmental dose-optimization program which includes automated exposure control, adjustment of the mA and/or kV according to patient size and/or use of iterative reconstruction technique. COMPARISON:  Chest CT 12/22/2021. FINDINGS: Pharynx and larynx: The glottis is closed on this noncontrast exam. There is no laryngeal asymmetry. No evidence of discrete laryngeal polyp or mass in the absence of IV contrast. Pharyngeal soft tissue contours are within normal limits. Noncontrast parapharyngeal and retropharyngeal spaces are within normal limits. Salivary glands: Negative noncontrast appearance. Thyroid : Negative. Lymph nodes: No cervical  lymphadenopathy. Vascular: Vascular patency is not evaluated in the absence of IV contrast. Left carotid calcified atherosclerosis. Limited intracranial: Negative. Visualized orbits: Postoperative changes to both globes, otherwise negative. Mastoids and visualized paranasal sinuses: Visualized paranasal sinuses and mastoids are well aerated. Skeleton: Cervical spine fusion C3 through C7. ACDF hardware remains in place C3 through C5. Solid-appearing arthrodesis throughout. No acute or suspicious osseous lesion. Upper chest: Calcified aortic atherosclerosis. 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 Mild apical lung scarring. No lymphadenopathy in the visible superior mediastinum. IMPRESSION: 1. Negative noncontrast CT appearance of the larynx and neck. No discrete laryngeal or pharyngeal mass or cervical lymphadenopathy identified. 2. Previous cervical spine fusion C3 through C7. 3.  Aortic Atherosclerosis (ICD10-I70.0). Electronically Signed   By: VEAR Hurst M.D.   On: 11/13/2022 14:31    Assessment & Plan:   Problem List Items Addressed This Visit     Dyslipidemia - Primary    Hold Lipitor and Fenofibrate  due to c/o memory issues according to his wife x months...       Anxiety disorder    Hold Lipitor and Fenofibrate  due to c/o memory issues according to his wife x months...      Relevant Medications   buPROPion  (WELLBUTRIN  XL) 150 MG 24 hr tablet   Memory problem    MCD Hold Lipitor and Fenofibrate  due to c/o memory issues according to his wife x months... Reduce Wellbutrin  XL dose Neurology ref was offered      Relevant Orders   Ambulatory referral to Neurology      Meds ordered this encounter  Medications   zolpidem  (AMBIEN ) 10 MG tablet    Sig: TAKE 1 TABLET BY MOUTH AT BEDTIME AS NEEDED    Dispense:  90 tablet    Refill:  1   buPROPion  (WELLBUTRIN  XL) 150 MG 24 hr tablet    Sig: Take 1 tablet (150 mg total) by mouth daily.    Dispense:  90 tablet     Refill:  1      Follow-up: Return in about 3 months (around 07/14/2023) for a follow-up visit.  Marolyn Noel, MD

## 2023-04-16 NOTE — Assessment & Plan Note (Signed)
Hold Lipitor and Fenofibrate due to c/o memory issues according to his wife x months.Marland KitchenMarland Kitchen

## 2023-04-16 NOTE — Assessment & Plan Note (Addendum)
MCD Hold Lipitor and Fenofibrate due to c/o memory issues according to his wife x months... Reduce Wellbutrin XL dose Neurology ref was offered

## 2023-04-18 ENCOUNTER — Other Ambulatory Visit: Payer: Self-pay | Admitting: Internal Medicine

## 2023-04-28 ENCOUNTER — Encounter (HOSPITAL_COMMUNITY): Payer: Self-pay

## 2023-04-28 ENCOUNTER — Inpatient Hospital Stay (HOSPITAL_COMMUNITY)
Admission: EM | Admit: 2023-04-28 | Discharge: 2023-05-03 | DRG: 501 | Disposition: A | Discharge status: Rehabilitation Facility | Payer: BC Managed Care – PPO | Attending: Internal Medicine | Admitting: Internal Medicine

## 2023-04-28 ENCOUNTER — Other Ambulatory Visit: Payer: Self-pay

## 2023-04-28 ENCOUNTER — Emergency Department (HOSPITAL_COMMUNITY): Payer: BC Managed Care – PPO

## 2023-04-28 DIAGNOSIS — Y93K1 Activity, walking an animal: Secondary | ICD-10-CM | POA: Diagnosis not present

## 2023-04-28 DIAGNOSIS — J4489 Other specified chronic obstructive pulmonary disease: Secondary | ICD-10-CM | POA: Diagnosis not present

## 2023-04-28 DIAGNOSIS — S76111S Strain of right quadriceps muscle, fascia and tendon, sequela: Secondary | ICD-10-CM | POA: Diagnosis not present

## 2023-04-28 DIAGNOSIS — R2689 Other abnormalities of gait and mobility: Secondary | ICD-10-CM | POA: Diagnosis not present

## 2023-04-28 DIAGNOSIS — M1711 Unilateral primary osteoarthritis, right knee: Secondary | ICD-10-CM | POA: Diagnosis present

## 2023-04-28 DIAGNOSIS — F32A Depression, unspecified: Secondary | ICD-10-CM | POA: Diagnosis present

## 2023-04-28 DIAGNOSIS — Z7901 Long term (current) use of anticoagulants: Secondary | ICD-10-CM | POA: Diagnosis not present

## 2023-04-28 DIAGNOSIS — M25562 Pain in left knee: Secondary | ICD-10-CM | POA: Diagnosis not present

## 2023-04-28 DIAGNOSIS — I10 Essential (primary) hypertension: Secondary | ICD-10-CM

## 2023-04-28 DIAGNOSIS — E119 Type 2 diabetes mellitus without complications: Secondary | ICD-10-CM | POA: Diagnosis present

## 2023-04-28 DIAGNOSIS — E782 Mixed hyperlipidemia: Secondary | ICD-10-CM | POA: Diagnosis present

## 2023-04-28 DIAGNOSIS — S83231A Complex tear of medial meniscus, current injury, right knee, initial encounter: Secondary | ICD-10-CM | POA: Diagnosis not present

## 2023-04-28 DIAGNOSIS — R001 Bradycardia, unspecified: Secondary | ICD-10-CM | POA: Diagnosis not present

## 2023-04-28 DIAGNOSIS — R066 Hiccough: Secondary | ICD-10-CM | POA: Diagnosis not present

## 2023-04-28 DIAGNOSIS — I2510 Atherosclerotic heart disease of native coronary artery without angina pectoris: Secondary | ICD-10-CM | POA: Diagnosis present

## 2023-04-28 DIAGNOSIS — S76119A Strain of unspecified quadriceps muscle, fascia and tendon, initial encounter: Secondary | ICD-10-CM | POA: Diagnosis present

## 2023-04-28 DIAGNOSIS — Z7951 Long term (current) use of inhaled steroids: Secondary | ICD-10-CM | POA: Diagnosis not present

## 2023-04-28 DIAGNOSIS — S7012XA Contusion of left thigh, initial encounter: Secondary | ICD-10-CM | POA: Diagnosis present

## 2023-04-28 DIAGNOSIS — S76111A Strain of right quadriceps muscle, fascia and tendon, initial encounter: Secondary | ICD-10-CM | POA: Diagnosis present

## 2023-04-28 DIAGNOSIS — Z87891 Personal history of nicotine dependence: Secondary | ICD-10-CM

## 2023-04-28 DIAGNOSIS — W19XXXA Unspecified fall, initial encounter: Secondary | ICD-10-CM | POA: Diagnosis not present

## 2023-04-28 DIAGNOSIS — M25061 Hemarthrosis, right knee: Secondary | ICD-10-CM | POA: Diagnosis present

## 2023-04-28 DIAGNOSIS — K219 Gastro-esophageal reflux disease without esophagitis: Secondary | ICD-10-CM | POA: Diagnosis not present

## 2023-04-28 DIAGNOSIS — S83241A Other tear of medial meniscus, current injury, right knee, initial encounter: Secondary | ICD-10-CM | POA: Diagnosis not present

## 2023-04-28 DIAGNOSIS — J449 Chronic obstructive pulmonary disease, unspecified: Secondary | ICD-10-CM | POA: Diagnosis present

## 2023-04-28 DIAGNOSIS — K5900 Constipation, unspecified: Secondary | ICD-10-CM | POA: Diagnosis not present

## 2023-04-28 DIAGNOSIS — I4891 Unspecified atrial fibrillation: Secondary | ICD-10-CM | POA: Diagnosis not present

## 2023-04-28 DIAGNOSIS — M62838 Other muscle spasm: Secondary | ICD-10-CM | POA: Diagnosis present

## 2023-04-28 DIAGNOSIS — F419 Anxiety disorder, unspecified: Secondary | ICD-10-CM | POA: Diagnosis not present

## 2023-04-28 DIAGNOSIS — Z800 Family history of malignant neoplasm of digestive organs: Secondary | ICD-10-CM | POA: Diagnosis not present

## 2023-04-28 DIAGNOSIS — T796XXA Traumatic ischemia of muscle, initial encounter: Secondary | ICD-10-CM | POA: Diagnosis present

## 2023-04-28 DIAGNOSIS — I700 Atherosclerosis of aorta: Secondary | ICD-10-CM | POA: Diagnosis not present

## 2023-04-28 DIAGNOSIS — S83421A Sprain of lateral collateral ligament of right knee, initial encounter: Secondary | ICD-10-CM | POA: Diagnosis not present

## 2023-04-28 DIAGNOSIS — G8918 Other acute postprocedural pain: Secondary | ICD-10-CM | POA: Diagnosis not present

## 2023-04-28 DIAGNOSIS — Y92830 Public park as the place of occurrence of the external cause: Secondary | ICD-10-CM

## 2023-04-28 DIAGNOSIS — I480 Paroxysmal atrial fibrillation: Secondary | ICD-10-CM | POA: Diagnosis not present

## 2023-04-28 DIAGNOSIS — S7010XA Contusion of unspecified thigh, initial encounter: Secondary | ICD-10-CM | POA: Diagnosis present

## 2023-04-28 DIAGNOSIS — N400 Enlarged prostate without lower urinary tract symptoms: Secondary | ICD-10-CM | POA: Diagnosis present

## 2023-04-28 DIAGNOSIS — Z823 Family history of stroke: Secondary | ICD-10-CM | POA: Diagnosis not present

## 2023-04-28 DIAGNOSIS — I110 Hypertensive heart disease with heart failure: Secondary | ICD-10-CM | POA: Diagnosis not present

## 2023-04-28 DIAGNOSIS — K5901 Slow transit constipation: Secondary | ICD-10-CM | POA: Diagnosis not present

## 2023-04-28 DIAGNOSIS — S76119D Strain of unspecified quadriceps muscle, fascia and tendon, subsequent encounter: Secondary | ICD-10-CM | POA: Diagnosis not present

## 2023-04-28 DIAGNOSIS — M7122 Synovial cyst of popliteal space [Baker], left knee: Secondary | ICD-10-CM | POA: Diagnosis not present

## 2023-04-28 DIAGNOSIS — D72829 Elevated white blood cell count, unspecified: Secondary | ICD-10-CM | POA: Diagnosis present

## 2023-04-28 DIAGNOSIS — I5032 Chronic diastolic (congestive) heart failure: Secondary | ICD-10-CM | POA: Diagnosis present

## 2023-04-28 DIAGNOSIS — W010XXA Fall on same level from slipping, tripping and stumbling without subsequent striking against object, initial encounter: Secondary | ICD-10-CM | POA: Diagnosis present

## 2023-04-28 DIAGNOSIS — J309 Allergic rhinitis, unspecified: Secondary | ICD-10-CM | POA: Diagnosis not present

## 2023-04-28 DIAGNOSIS — E871 Hypo-osmolality and hyponatremia: Secondary | ICD-10-CM | POA: Diagnosis not present

## 2023-04-28 DIAGNOSIS — S7011XA Contusion of right thigh, initial encounter: Secondary | ICD-10-CM | POA: Diagnosis present

## 2023-04-28 DIAGNOSIS — K5903 Drug induced constipation: Secondary | ICD-10-CM | POA: Diagnosis not present

## 2023-04-28 DIAGNOSIS — R931 Abnormal findings on diagnostic imaging of heart and coronary circulation: Secondary | ICD-10-CM | POA: Diagnosis not present

## 2023-04-28 DIAGNOSIS — S83411A Sprain of medial collateral ligament of right knee, initial encounter: Secondary | ICD-10-CM | POA: Diagnosis not present

## 2023-04-28 DIAGNOSIS — Z79899 Other long term (current) drug therapy: Secondary | ICD-10-CM | POA: Diagnosis not present

## 2023-04-28 DIAGNOSIS — S76112S Strain of left quadriceps muscle, fascia and tendon, sequela: Secondary | ICD-10-CM | POA: Diagnosis not present

## 2023-04-28 DIAGNOSIS — S8002XA Contusion of left knee, initial encounter: Secondary | ICD-10-CM | POA: Diagnosis present

## 2023-04-28 DIAGNOSIS — S76112A Strain of left quadriceps muscle, fascia and tendon, initial encounter: Secondary | ICD-10-CM | POA: Diagnosis not present

## 2023-04-28 DIAGNOSIS — M25462 Effusion, left knee: Secondary | ICD-10-CM | POA: Diagnosis not present

## 2023-04-28 DIAGNOSIS — M7041 Prepatellar bursitis, right knee: Secondary | ICD-10-CM | POA: Diagnosis not present

## 2023-04-28 DIAGNOSIS — Z043 Encounter for examination and observation following other accident: Secondary | ICD-10-CM | POA: Diagnosis not present

## 2023-04-28 DIAGNOSIS — W19XXXS Unspecified fall, sequela: Secondary | ICD-10-CM | POA: Diagnosis present

## 2023-04-28 DIAGNOSIS — S83412A Sprain of medial collateral ligament of left knee, initial encounter: Secondary | ICD-10-CM | POA: Diagnosis not present

## 2023-04-28 DIAGNOSIS — M25461 Effusion, right knee: Secondary | ICD-10-CM | POA: Diagnosis not present

## 2023-04-28 DIAGNOSIS — S8001XA Contusion of right knee, initial encounter: Secondary | ICD-10-CM | POA: Diagnosis not present

## 2023-04-28 DIAGNOSIS — G4700 Insomnia, unspecified: Secondary | ICD-10-CM | POA: Diagnosis present

## 2023-04-28 DIAGNOSIS — S83422A Sprain of lateral collateral ligament of left knee, initial encounter: Secondary | ICD-10-CM | POA: Diagnosis not present

## 2023-04-28 DIAGNOSIS — Z820 Family history of epilepsy and other diseases of the nervous system: Secondary | ICD-10-CM

## 2023-04-28 DIAGNOSIS — M1712 Unilateral primary osteoarthritis, left knee: Secondary | ICD-10-CM | POA: Diagnosis not present

## 2023-04-28 DIAGNOSIS — Z818 Family history of other mental and behavioral disorders: Secondary | ICD-10-CM

## 2023-04-28 DIAGNOSIS — M7042 Prepatellar bursitis, left knee: Secondary | ICD-10-CM | POA: Diagnosis not present

## 2023-04-28 DIAGNOSIS — Z860100 Personal history of colon polyps, unspecified: Secondary | ICD-10-CM | POA: Diagnosis not present

## 2023-04-28 DIAGNOSIS — M79604 Pain in right leg: Secondary | ICD-10-CM | POA: Diagnosis not present

## 2023-04-28 DIAGNOSIS — E785 Hyperlipidemia, unspecified: Secondary | ICD-10-CM | POA: Diagnosis present

## 2023-04-28 DIAGNOSIS — M79605 Pain in left leg: Secondary | ICD-10-CM | POA: Diagnosis not present

## 2023-04-28 DIAGNOSIS — R52 Pain, unspecified: Secondary | ICD-10-CM | POA: Diagnosis present

## 2023-04-28 DIAGNOSIS — M79651 Pain in right thigh: Secondary | ICD-10-CM | POA: Diagnosis not present

## 2023-04-28 DIAGNOSIS — Z91048 Other nonmedicinal substance allergy status: Secondary | ICD-10-CM | POA: Diagnosis not present

## 2023-04-28 LAB — COMPREHENSIVE METABOLIC PANEL
ALT: 27 U/L (ref 0–44)
AST: 53 U/L — ABNORMAL HIGH (ref 15–41)
Albumin: 3.7 g/dL (ref 3.5–5.0)
Alkaline Phosphatase: 38 U/L (ref 38–126)
Anion gap: 10 (ref 5–15)
BUN: 16 mg/dL (ref 8–23)
CO2: 24 mmol/L (ref 22–32)
Calcium: 8.9 mg/dL (ref 8.9–10.3)
Chloride: 103 mmol/L (ref 98–111)
Creatinine, Ser: 0.85 mg/dL (ref 0.61–1.24)
GFR, Estimated: >60 mL/min (ref >60)
Glucose, Bld: 106 mg/dL — ABNORMAL HIGH (ref 70–99)
Potassium: 3.5 mmol/L (ref 3.5–5.1)
Sodium: 137 mmol/L (ref 135–145)
Total Bilirubin: 0.8 mg/dL (ref 0.0–1.2)
Total Protein: 6.3 g/dL — ABNORMAL LOW (ref 6.5–8.1)

## 2023-04-28 LAB — CBC WITH DIFFERENTIAL/PLATELET
Abs Immature Granulocytes: 0.15 10*3/uL — ABNORMAL HIGH (ref 0.00–0.07)
Basophils Absolute: 0.1 10*3/uL (ref 0.0–0.1)
Basophils Relative: 0 %
Eosinophils Absolute: 0.1 10*3/uL (ref 0.0–0.5)
Eosinophils Relative: 0 %
HCT: 49.3 % (ref 39.0–52.0)
Hemoglobin: 16.7 g/dL (ref 13.0–17.0)
Immature Granulocytes: 1 %
Lymphocytes Relative: 5 %
Lymphs Abs: 1 10*3/uL (ref 0.7–4.0)
MCH: 31.5 pg (ref 26.0–34.0)
MCHC: 33.9 g/dL (ref 30.0–36.0)
MCV: 93 fL (ref 80.0–100.0)
Monocytes Absolute: 1.4 10*3/uL — ABNORMAL HIGH (ref 0.1–1.0)
Monocytes Relative: 7 %
Neutro Abs: 16.4 10*3/uL — ABNORMAL HIGH (ref 1.7–7.7)
Neutrophils Relative %: 87 %
Platelets: 234 10*3/uL (ref 150–400)
RBC: 5.3 MIL/uL (ref 4.22–5.81)
RDW: 12.5 % (ref 11.5–15.5)
WBC: 19 10*3/uL — ABNORMAL HIGH (ref 4.0–10.5)
nRBC: 0 % (ref 0.0–0.2)

## 2023-04-28 LAB — CK: Total CK: 702 U/L — ABNORMAL HIGH (ref 49–397)

## 2023-04-28 MED ORDER — SODIUM CHLORIDE 0.9 % IV BOLUS
1000.0000 mL | Freq: Once | INTRAVENOUS | Status: AC
  Administered 2023-04-28: 1000 mL via INTRAVENOUS

## 2023-04-28 MED ORDER — HYDROMORPHONE HCL 1 MG/ML IJ SOLN
1.0000 mg | Freq: Once | INTRAMUSCULAR | Status: AC
  Administered 2023-04-28: 1 mg via INTRAVENOUS
  Filled 2023-04-28: qty 1

## 2023-04-28 MED ORDER — LIDOCAINE HCL 2 % IJ SOLN
10.0000 mL | Freq: Once | INTRAMUSCULAR | Status: AC
  Administered 2023-04-28: 200 mg
  Filled 2023-04-28: qty 20

## 2023-04-28 MED ORDER — HYDROMORPHONE HCL 1 MG/ML IJ SOLN: 0.5000 mg | INTRAMUSCULAR | Status: DC | PRN

## 2023-04-28 MED ORDER — DIAZEPAM 5 MG/ML IJ SOLN
2.5000 mg | Freq: Once | INTRAMUSCULAR | Status: AC
  Administered 2023-04-28: 2.5 mg via INTRAVENOUS
  Filled 2023-04-28: qty 2

## 2023-04-28 MED ORDER — SODIUM CHLORIDE 0.9 % IV SOLN: INTRAVENOUS | Status: DC

## 2023-04-28 MED ORDER — IOHEXOL 300 MG/ML  SOLN
100.0000 mL | Freq: Once | INTRAMUSCULAR | Status: AC | PRN
  Administered 2023-04-28: 100 mL via INTRAVENOUS
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
## 2023-04-28 NOTE — Assessment & Plan Note (Signed)
 Pt states now off statin and tricor

## 2023-04-28 NOTE — ED Notes (Signed)
 Patient transported to X-ray

## 2023-04-28 NOTE — Assessment & Plan Note (Signed)
 Restrt Diovan or the equivalent

## 2023-04-28 NOTE — Subjective & Objective (Signed)
 Pt had a mechanical fall With severe swelling  CT showed large hematoma  Orthopedics will see in AM

## 2023-04-28 NOTE — ED Provider Notes (Addendum)
 [facility]ERGENCY DEPARTMENT AT [facility] Provider Note   CSN: [id number] Arrival date & time: 04/28/23  1629     History  Chief Complaint  Patient presents with   Paul [name]. is a 78 y.o. male history of diabetes, hypertension, hyperlipidemia here presenting with fall.  Patient states that he was walking his dog at the dog park.  He states that the dog suddenly ran and he got pulled and fell on his knees.  Patient states that he then had muscle spasm and unable to walk.  He states that his right thigh hurts in particular.  He was given fentanyl by EMS.  Denies any head injury or other injuries.  The history is provided by the patient.       Home Medications Prior to Admission medications   Medication Sig Start Date End Date Taking? Authorizing Provider  albuterol (VENTOLIN HFA) 108 (90 Base) MCG/ACT inhaler Inhale 2 puffs into the lungs every 6 (six) hours as needed for wheezing or shortness of breath. 03/15/22   Icard, Rachel Bo, DO  atorvastatin (LIPITOR) 10 MG tablet Take 1 tablet (10 mg total) by mouth daily. 11/16/22   [name], Georgina Quint, MD  budesonide-formoterol (SYMBICORT) 80-4.5 MCG/ACT inhaler Inhale 2 puffs into the lungs 2 (two) times daily. 02/27/23   Cobb, Ruby Cola, NP  buPROPion (WELLBUTRIN XL) 150 MG 24 hr tablet Take 1 tablet (150 mg total) by mouth daily. 04/16/23   [name], Georgina Quint, MD  fenofibrate (TRICOR) 145 MG tablet TAKE 1 TABLET BY MOUTH EVERY DAY 03/18/20   [name], Georgina Quint, MD  finasteride (PROSCAR) 5 MG tablet Take 1 tablet by mouth daily. 12/18/22   [name], Georgina Quint, MD  ketoconazole (NIZORAL) 2 % cream Apply topically 2 (two) times daily. 02/06/22   [provider]  Multiple Vitamins-Minerals (MENS MULTIVITAMIN) TABS Take by mouth. Take 1 tablet by mouth every day    [provider]  pantoprazole (PROTONIX) 40 MG tablet Take 1 tablet (40 mg total) by mouth daily. Annual appt due in Oct must see  provider for future refills 04/01/23   [name], Georgina Quint, MD  Spacer/Aero-Holding Rudean Curt Use with Symbicort inhaler 02/27/23   Cobb, Ruby Cola, NP  SYRINGE-NEEDLE, DISP, 3 ML 23G X 1" 3 ML MISC As directed IM weekly 10/16/22   [name], Georgina Quint, MD  testosterone cypionate (DEPOTESTOSTERONE CYPIONATE) 200 MG/ML injection ADMINISTER 0.5 ML(100 MG) IN THE MUSCLE 1 TIME A WEEK 04/19/23   [name], Georgina Quint, MD  triamterene-hydrochlorothiazide (MAXZIDE-25) 37.5-25 MG tablet Take 1 tablet by mouth daily. 12/18/22   [name], Georgina Quint, MD  valsartan (DIOVAN) 160 MG tablet Take 1 tablet (160 mg total) by mouth daily. 05/12/22   [name], Georgina Quint, MD  zolpidem (AMBIEN) 10 MG tablet TAKE 1 TABLET BY MOUTH AT BEDTIME AS NEEDED 04/16/23   [name], Georgina Quint, MD      Allergies    Other    Review of Systems   Review of Systems  Musculoskeletal:        Bilateral knee pain and right thigh pain  All other systems reviewed and are negative.   Physical Exam Updated Vital Signs BP (!) 172/92 (BP Location: Right Arm)   Pulse 88   Temp 98.4 F (36.9 C) (Oral)   Resp 17   Ht 6' (1.829 m)   Wt 90.7 kg   SpO2 99%   BMI 27.12 kg/m  Physical Exam Vitals and nursing  note reviewed.  Constitutional:      Comments: Uncomfortable and writhing in pain  HENT:     Head: Normocephalic and atraumatic.     Comments: No signs of head injury    Nose: Nose normal.     Mouth/Throat:     Mouth: Mucous membranes are moist.  Eyes:     Extraocular Movements: Extraocular movements intact.     Pupils: Pupils are equal, round, and reactive to light.  Cardiovascular:     Rate and Rhythm: Normal rate and regular rhythm.     Pulses: Normal pulses.     Heart sounds: Normal heart sounds.  Pulmonary:     Effort: Pulmonary effort is normal.     Breath sounds: Normal breath sounds.  Abdominal:     General: Abdomen is flat.     Palpations: Abdomen is soft.  Musculoskeletal:     Cervical back: Normal range  of motion.     Comments: Patient has muscle spasm of the lateral aspect of the right thigh.  Patient has an obvious hematoma of the right knee.  Patient also has a small hematoma of the left knee.  Patient has 2+ femoral and popliteal pulses bilaterally.  Skin:    General: Skin is warm.     Capillary Refill: Capillary refill takes less than 2 seconds.  Neurological:     General: No focal deficit present.     Mental Status: He is alert and oriented to person, place, and time.  Psychiatric:        Mood and Affect: Mood normal.        Behavior: Behavior normal.     ED Results / Procedures / Treatments   Labs (all labs ordered are listed, but only abnormal results are displayed) Labs Reviewed  CBC WITH DIFFERENTIAL/PLATELET - Abnormal; Notable for the following components:      Result Value   WBC 19.0 (*)    Neutro Abs 16.4 (*)    Monocytes Absolute 1.4 (*)    Abs Immature Granulocytes 0.15 (*)    All other components within normal limits  COMPREHENSIVE METABOLIC PANEL - Abnormal; Notable for the following components:   Glucose, Bld 106 (*)    Total Protein 6.3 (*)    AST 53 (*)    All other components within normal limits  CK - Abnormal; Notable for the following components:   Total CK 702 (*)    All other components within normal limits    EKG None  Radiology CT FEMUR RIGHT W CONTRAST Result Date: 04/28/2023 CLINICAL DATA:  Upper leg trauma R upper leg trauma r/o muscle hematoma. Fall EXAM: CT OF THE LOWER RIGHT EXTREMITY WITH CONTRAST TECHNIQUE: Multidetector CT imaging of the lower right extremity was performed according to the standard protocol following intravenous contrast administration. RADIATION DOSE REDUCTION: This exam was performed according to the departmental dose-optimization program which includes automated exposure control, adjustment of the mA and/or kV according to patient size and/or use of iterative reconstruction technique. CONTRAST:  OMNIPAQUE IOHEXOL  300 MG/ML  SOLN COMPARISON:  X-ray right knee 04/28/2023 FINDINGS: Bones/Joint/Cartilage No evidence of fracture or dislocation. Trace joint effusion. No evidence of severe arthropathy. No aggressive appearing focal bone abnormality. Ligaments Suboptimally assessed by CT. Muscles and Tendons Grossly unremarkable. No definite heterogeneity of the musculature to suggest underlying intramuscular hematoma. No left-sided to compare symmetry. Soft tissues Large volume anterior knee subcutaneus soft tissue hematoma formation. Atherosclerotic plaque of the femoral artery and popliteal artery. Vasectomy.  IMPRESSION: 1. Large volume anterior knee subcutaneus soft tissue hematoma formation. 2.  Trace joint effusion. Electronically Signed   By: Tish Frederickson M.D.   On: 04/28/2023 20:18   DG Femur Min 2 Views Right Result Date: 04/28/2023 CLINICAL DATA:  Fall, thigh pain EXAM: RIGHT FEMUR 2 VIEWS COMPARISON:  Knee radiographs 04/28/2023 FINDINGS: Atheromatous vascular calcifications in the thigh. Knee effusion with fluid in the suprapatellar bursa. Prepatellar soft tissue swelling, cannot exclude prepatellar bursitis. Indistinct distal quadriceps tendon contour, cannot exclude distal quadriceps tendon injury. IMPRESSION: 1. Knee effusion with fluid in the suprapatellar bursa. 2. Prepatellar soft tissue swelling, cannot exclude prepatellar bursitis. 3. Indistinct distal quadriceps tendon contour, cannot exclude distal quadriceps tendon injury. 4. Atheromatous vascular calcifications in the thigh. Electronically Signed   By: Gaylyn Rong M.D.   On: 04/28/2023 17:38   DG Knee Complete 4 Views Left Result Date: 04/28/2023 CLINICAL DATA:  Fall EXAM: LEFT KNEE - COMPLETE 4+ VIEW COMPARISON:  08/21/2021 FINDINGS: As on the prior exam, ossific structures posterior to the knee joint probably represent loose osteochondral fragments, possibly in a Baker's cyst. A previously fragmented osteophyte from the anterior superior  patella appears proximally retracted by 3.5 cm, raising suspicion for quadriceps avulsion or rupture. Indistinct tissue planes around the distal quadriceps tendon. Suspected fluid in the suprapatellar bursa. No other fracture identified. IMPRESSION: 1. A previously fragmented osteophyte from the anterior superior patella appears proximally retracted by 3.5 cm, raising suspicion for quadriceps avulsion or rupture. 2. Suspected fluid in the suprapatellar bursa. 3. Ossific structures posterior to the knee joint probably represent loose osteochondral fragments, possibly in a Baker's cyst. Electronically Signed   By: Gaylyn Rong M.D.   On: 04/28/2023 17:36   DG Knee Complete 4 Views Right Result Date: 04/28/2023 CLINICAL DATA:  Status post fall.  Complains of right thigh pain. EXAM: RIGHT KNEE - COMPLETE 4+ VIEW COMPARISON:  None Available. FINDINGS: Small to moderate suprapatellar joint effusion identified. No signs of acute fracture or dislocation. Mild degenerative changes identified within the patellofemoral compartment. IMPRESSION: 1. Small to moderate suprapatellar joint effusion. 2. No acute fracture. 3. Mild patellofemoral osteoarthritis. Electronically Signed   By: Signa Kell M.D.   On: 04/28/2023 17:32    Procedures .Joint Aspiration/Arthrocentesis  Date/Time: 04/28/2023 11:14 PM  Performed by: Charlynne Pander, MD Authorized by: Charlynne Pander, MD   Consent:    Consent obtained:  Verbal   Consent given by:  Patient   Risks, benefits, and alternatives were discussed: yes     Risks discussed:  Bleeding Universal protocol:    Procedure explained and questions answered to patient or proxy's satisfaction: yes     Patient identity confirmed:  Verbally with patient Location:    Location: R suprapatellar area. Anesthesia:    Anesthesia method:  Local infiltration   Local anesthetic:  Lidocaine 2% w/o epi Procedure details:    Preparation: Patient was prepped and draped in  usual sterile fashion     Needle gauge:  20 G   Ultrasound guidance: no     Approach:  Medial   Aspirate amount:  30 cc   Aspirate characteristics:  Bloody   Steroid injected: no     Specimen collected: no   Post-procedure details:    Dressing:  Gauze roll   Procedure completion:  Tolerated Comments:     I was able to aspirate 30 cc of dark blood.  Compression dressing placed     Medications Ordered in ED Medications  sodium chloride 0.9 % bolus 1,000 mL (0 mLs Intravenous Stopped 04/28/23 1932)  HYDROmorphone (DILAUDID) injection 1 mg (1 mg Intravenous Given 04/28/23 1750)  diazepam (VALIUM) injection 2.5 mg (2.5 mg Intravenous Given 04/28/23 1750)  HYDROmorphone (DILAUDID) injection 1 mg (1 mg Intravenous Given 04/28/23 1929)  iohexol (OMNIPAQUE) 300 MG/ML solution 100 mL (100 mLs Intravenous Contrast Given 04/28/23 1942)  HYDROmorphone (DILAUDID) injection 1 mg (1 mg Intravenous Given 04/28/23 2215)    ED Course/ Medical Decision Making/ A&P                                 Medical Decision Making Orlandus Borowski. is a 78 y.o. male here presenting with fall.  Patient had a mechanical fall and hit bilateral knees and right thigh.  Patient has obvious muscle spasm of the right thigh.  Concern for possible muscle hematoma versus femur fracture versus rhabdomyolysis.  Plan to get x-rays and will likely need CT of the right femur with contrast.  Will get CK level and CBC and CMP and give pain medicine and muscle relaxant.  10:29 PM Patient's white count is 19.  CK level is 700.  X-rays showed no obvious fracture.  CT showed large volume subcutaneous tissue hematoma.  There is only trace effusion.  I was unable to control his pain.  I discussed case with Dr. Eulah Pont.  He recommend trying to draw off some fluid.  I performed bedside ultrasound and was unable to see a large fluid pocket but just diffuse subcutaneous tissue edema. At this point patient will be admitted by medicine service.   Dr. Eulah Pont will see patient tomorrow as a consult.  Concern for possible quadricep muscle rupture with surrounding hematoma.   11:15 PM Dr. Eulah Pont called back and requested that I perform aspiration for pain control.  I was able to aspirate about 30 cc of dark blood from the right suprapatellar area.  I was able to put a compressive dressing to prevent reaccumulation.  Patient admitted for pain control and Ortho to see in the morning.    Problems Addressed: Traumatic hematoma of knee, left, initial encounter: acute illness or injury Traumatic hematoma of right knee, initial encounter: acute illness or injury  Amount and/or Complexity of Data Reviewed Labs: ordered. Decision-making details documented in ED Course. Radiology: ordered and independent interpretation performed. Decision-making details documented in ED Course.  Risk Prescription drug management. Decision regarding hospitalization.    Final Clinical Impression(s) / ED Diagnoses Final diagnoses:  Traumatic hematoma of right knee, initial encounter  Traumatic hematoma of knee, left, initial encounter    Rx / DC Orders ED Discharge Orders     None         Charlynne Pander, MD 04/28/23 2231    Charlynne Pander, MD 04/28/23 2316    Charlynne Pander, MD 04/29/23 [phone]

## 2023-04-28 NOTE — Assessment & Plan Note (Signed)
Chronic stable continue home meds

## 2023-04-28 NOTE — ED Notes (Addendum)
 Admitting provider Therisa Doyne at bedside speaking with patient

## 2023-04-28 NOTE — Assessment & Plan Note (Signed)
 Dr. Herma Carson ER has attempted evacuation in ER

## 2023-04-28 NOTE — H&P (Signed)
 I spoke with EDP about patient. He has a sub cutaneous hematoma. Plan is for EDP to perform ultrasound guided aspiration. Patient can follow up with me or if needing admission will see in consultation awaiting results of that aspiration.

## 2023-04-29 ENCOUNTER — Encounter (HOSPITAL_COMMUNITY): Payer: Self-pay | Admitting: Internal Medicine

## 2023-04-29 ENCOUNTER — Observation Stay (HOSPITAL_COMMUNITY): Payer: BC Managed Care – PPO

## 2023-04-29 DIAGNOSIS — W010XXA Fall on same level from slipping, tripping and stumbling without subsequent striking against object, initial encounter: Secondary | ICD-10-CM | POA: Diagnosis present

## 2023-04-29 DIAGNOSIS — R52 Pain, unspecified: Secondary | ICD-10-CM | POA: Diagnosis present

## 2023-04-29 DIAGNOSIS — E119 Type 2 diabetes mellitus without complications: Secondary | ICD-10-CM | POA: Diagnosis present

## 2023-04-29 DIAGNOSIS — S7011XA Contusion of right thigh, initial encounter: Secondary | ICD-10-CM | POA: Diagnosis not present

## 2023-04-29 DIAGNOSIS — I10 Essential (primary) hypertension: Secondary | ICD-10-CM | POA: Diagnosis not present

## 2023-04-29 DIAGNOSIS — R066 Hiccough: Secondary | ICD-10-CM | POA: Diagnosis not present

## 2023-04-29 DIAGNOSIS — Y93K1 Activity, walking an animal: Secondary | ICD-10-CM | POA: Diagnosis not present

## 2023-04-29 DIAGNOSIS — S76111A Strain of right quadriceps muscle, fascia and tendon, initial encounter: Secondary | ICD-10-CM | POA: Diagnosis not present

## 2023-04-29 DIAGNOSIS — M79604 Pain in right leg: Secondary | ICD-10-CM | POA: Diagnosis not present

## 2023-04-29 DIAGNOSIS — Z800 Family history of malignant neoplasm of digestive organs: Secondary | ICD-10-CM | POA: Diagnosis not present

## 2023-04-29 DIAGNOSIS — W19XXXS Unspecified fall, sequela: Secondary | ICD-10-CM | POA: Diagnosis present

## 2023-04-29 DIAGNOSIS — E782 Mixed hyperlipidemia: Secondary | ICD-10-CM | POA: Diagnosis present

## 2023-04-29 DIAGNOSIS — M79605 Pain in left leg: Secondary | ICD-10-CM | POA: Diagnosis not present

## 2023-04-29 DIAGNOSIS — D72829 Elevated white blood cell count, unspecified: Secondary | ICD-10-CM | POA: Diagnosis present

## 2023-04-29 DIAGNOSIS — J309 Allergic rhinitis, unspecified: Secondary | ICD-10-CM | POA: Diagnosis present

## 2023-04-29 DIAGNOSIS — Z823 Family history of stroke: Secondary | ICD-10-CM | POA: Diagnosis not present

## 2023-04-29 DIAGNOSIS — M25562 Pain in left knee: Secondary | ICD-10-CM | POA: Diagnosis not present

## 2023-04-29 DIAGNOSIS — T796XXA Traumatic ischemia of muscle, initial encounter: Secondary | ICD-10-CM | POA: Diagnosis present

## 2023-04-29 DIAGNOSIS — K5900 Constipation, unspecified: Secondary | ICD-10-CM | POA: Diagnosis not present

## 2023-04-29 DIAGNOSIS — K5901 Slow transit constipation: Secondary | ICD-10-CM | POA: Diagnosis not present

## 2023-04-29 DIAGNOSIS — K5903 Drug induced constipation: Secondary | ICD-10-CM | POA: Diagnosis not present

## 2023-04-29 DIAGNOSIS — S76119A Strain of unspecified quadriceps muscle, fascia and tendon, initial encounter: Secondary | ICD-10-CM | POA: Diagnosis not present

## 2023-04-29 DIAGNOSIS — R2689 Other abnormalities of gait and mobility: Secondary | ICD-10-CM | POA: Diagnosis not present

## 2023-04-29 DIAGNOSIS — E785 Hyperlipidemia, unspecified: Secondary | ICD-10-CM | POA: Diagnosis not present

## 2023-04-29 DIAGNOSIS — G4700 Insomnia, unspecified: Secondary | ICD-10-CM | POA: Diagnosis not present

## 2023-04-29 DIAGNOSIS — S76112A Strain of left quadriceps muscle, fascia and tendon, initial encounter: Secondary | ICD-10-CM | POA: Diagnosis not present

## 2023-04-29 DIAGNOSIS — M1712 Unilateral primary osteoarthritis, left knee: Secondary | ICD-10-CM | POA: Diagnosis not present

## 2023-04-29 DIAGNOSIS — S83241A Other tear of medial meniscus, current injury, right knee, initial encounter: Secondary | ICD-10-CM | POA: Diagnosis present

## 2023-04-29 DIAGNOSIS — I110 Hypertensive heart disease with heart failure: Secondary | ICD-10-CM | POA: Diagnosis present

## 2023-04-29 DIAGNOSIS — Y92830 Public park as the place of occurrence of the external cause: Secondary | ICD-10-CM | POA: Diagnosis not present

## 2023-04-29 DIAGNOSIS — M1711 Unilateral primary osteoarthritis, right knee: Secondary | ICD-10-CM | POA: Diagnosis not present

## 2023-04-29 DIAGNOSIS — Z87891 Personal history of nicotine dependence: Secondary | ICD-10-CM | POA: Diagnosis not present

## 2023-04-29 DIAGNOSIS — S76112S Strain of left quadriceps muscle, fascia and tendon, sequela: Secondary | ICD-10-CM | POA: Diagnosis not present

## 2023-04-29 DIAGNOSIS — I4891 Unspecified atrial fibrillation: Secondary | ICD-10-CM | POA: Diagnosis not present

## 2023-04-29 DIAGNOSIS — Z7901 Long term (current) use of anticoagulants: Secondary | ICD-10-CM | POA: Diagnosis not present

## 2023-04-29 DIAGNOSIS — M25461 Effusion, right knee: Secondary | ICD-10-CM | POA: Diagnosis not present

## 2023-04-29 DIAGNOSIS — Z79899 Other long term (current) drug therapy: Secondary | ICD-10-CM | POA: Diagnosis not present

## 2023-04-29 DIAGNOSIS — J4489 Other specified chronic obstructive pulmonary disease: Secondary | ICD-10-CM | POA: Diagnosis present

## 2023-04-29 DIAGNOSIS — I700 Atherosclerosis of aorta: Secondary | ICD-10-CM | POA: Diagnosis present

## 2023-04-29 DIAGNOSIS — Z7951 Long term (current) use of inhaled steroids: Secondary | ICD-10-CM | POA: Diagnosis not present

## 2023-04-29 DIAGNOSIS — I480 Paroxysmal atrial fibrillation: Secondary | ICD-10-CM | POA: Diagnosis present

## 2023-04-29 DIAGNOSIS — F419 Anxiety disorder, unspecified: Secondary | ICD-10-CM | POA: Diagnosis present

## 2023-04-29 DIAGNOSIS — F32A Depression, unspecified: Secondary | ICD-10-CM | POA: Diagnosis present

## 2023-04-29 DIAGNOSIS — I5032 Chronic diastolic (congestive) heart failure: Secondary | ICD-10-CM | POA: Diagnosis present

## 2023-04-29 DIAGNOSIS — J449 Chronic obstructive pulmonary disease, unspecified: Secondary | ICD-10-CM | POA: Diagnosis not present

## 2023-04-29 DIAGNOSIS — Z818 Family history of other mental and behavioral disorders: Secondary | ICD-10-CM | POA: Diagnosis not present

## 2023-04-29 DIAGNOSIS — S76119D Strain of unspecified quadriceps muscle, fascia and tendon, subsequent encounter: Secondary | ICD-10-CM | POA: Diagnosis not present

## 2023-04-29 DIAGNOSIS — Z860100 Personal history of colon polyps, unspecified: Secondary | ICD-10-CM | POA: Diagnosis not present

## 2023-04-29 DIAGNOSIS — K219 Gastro-esophageal reflux disease without esophagitis: Secondary | ICD-10-CM | POA: Diagnosis present

## 2023-04-29 DIAGNOSIS — R931 Abnormal findings on diagnostic imaging of heart and coronary circulation: Secondary | ICD-10-CM | POA: Diagnosis not present

## 2023-04-29 DIAGNOSIS — S76111S Strain of right quadriceps muscle, fascia and tendon, sequela: Secondary | ICD-10-CM | POA: Diagnosis not present

## 2023-04-29 DIAGNOSIS — S7010XA Contusion of unspecified thigh, initial encounter: Secondary | ICD-10-CM | POA: Diagnosis present

## 2023-04-29 DIAGNOSIS — I2510 Atherosclerotic heart disease of native coronary artery without angina pectoris: Secondary | ICD-10-CM | POA: Diagnosis present

## 2023-04-29 DIAGNOSIS — Z820 Family history of epilepsy and other diseases of the nervous system: Secondary | ICD-10-CM | POA: Diagnosis not present

## 2023-04-29 DIAGNOSIS — E871 Hypo-osmolality and hyponatremia: Secondary | ICD-10-CM | POA: Diagnosis present

## 2023-04-29 DIAGNOSIS — N400 Enlarged prostate without lower urinary tract symptoms: Secondary | ICD-10-CM | POA: Diagnosis present

## 2023-04-29 DIAGNOSIS — Z91048 Other nonmedicinal substance allergy status: Secondary | ICD-10-CM | POA: Diagnosis not present

## 2023-04-29 DIAGNOSIS — M25462 Effusion, left knee: Secondary | ICD-10-CM | POA: Diagnosis not present

## 2023-04-29 DIAGNOSIS — S83231A Complex tear of medial meniscus, current injury, right knee, initial encounter: Secondary | ICD-10-CM | POA: Diagnosis not present

## 2023-04-29 LAB — CBC
HCT: 47.9 % (ref 39.0–52.0)
Hemoglobin: 15.6 g/dL (ref 13.0–17.0)
MCH: 31.3 pg (ref 26.0–34.0)
MCHC: 32.6 g/dL (ref 30.0–36.0)
MCV: 96.2 fL (ref 80.0–100.0)
Platelets: 222 10*3/uL (ref 150–400)
RBC: 4.98 MIL/uL (ref 4.22–5.81)
RDW: 12.8 % (ref 11.5–15.5)
WBC: 8.3 10*3/uL (ref 4.0–10.5)
nRBC: 0 % (ref 0.0–0.2)

## 2023-04-29 LAB — COMPREHENSIVE METABOLIC PANEL
ALT: 25 U/L (ref 0–44)
AST: 55 U/L — ABNORMAL HIGH (ref 15–41)
Albumin: 3.5 g/dL (ref 3.5–5.0)
Alkaline Phosphatase: 37 U/L — ABNORMAL LOW (ref 38–126)
Anion gap: 9 (ref 5–15)
BUN: 15 mg/dL (ref 8–23)
CO2: 24 mmol/L (ref 22–32)
Calcium: 8.2 mg/dL — ABNORMAL LOW (ref 8.9–10.3)
Chloride: 104 mmol/L (ref 98–111)
Creatinine, Ser: 0.74 mg/dL (ref 0.61–1.24)
GFR, Estimated: >60 mL/min (ref >60)
Glucose, Bld: 109 mg/dL — ABNORMAL HIGH (ref 70–99)
Potassium: 3.9 mmol/L (ref 3.5–5.1)
Sodium: 137 mmol/L (ref 135–145)
Total Bilirubin: 1.3 mg/dL — ABNORMAL HIGH (ref 0.0–1.2)
Total Protein: 5.8 g/dL — ABNORMAL LOW (ref 6.5–8.1)

## 2023-04-29 LAB — PHOSPHORUS: Phosphorus: 2.4 mg/dL — ABNORMAL LOW (ref 2.5–4.6)

## 2023-04-29 LAB — MAGNESIUM: Magnesium: 1.8 mg/dL (ref 1.7–2.4)

## 2023-04-29 LAB — CK: Total CK: 1098 U/L — ABNORMAL HIGH (ref 49–397)

## 2023-04-29 MED ORDER — IRBESARTAN 150 MG PO TABS
150.0000 mg | ORAL_TABLET | Freq: Every day | ORAL | Status: DC
  Administered 2023-04-29 – 2023-05-02 (×4): 150 mg via ORAL
  Filled 2023-04-29 (×4): qty 1

## 2023-04-29 MED ORDER — HYDROMORPHONE HCL 1 MG/ML IJ SOLN
0.5000 mg | INTRAMUSCULAR | Status: DC | PRN
  Administered 2023-04-29 – 2023-04-30 (×9): 1 mg via INTRAVENOUS
  Filled 2023-04-29 (×9): qty 1

## 2023-04-29 MED ORDER — ALBUTEROL SULFATE (2.5 MG/3ML) 0.083% IN NEBU: 2.5000 mg | INHALATION_SOLUTION | RESPIRATORY_TRACT | Status: DC | PRN

## 2023-04-29 MED ORDER — ACETAMINOPHEN 325 MG PO TABS: 650.0000 mg | ORAL_TABLET | Freq: Four times a day (QID) | ORAL | Status: DC | PRN

## 2023-04-29 MED ORDER — PANTOPRAZOLE SODIUM 40 MG PO TBEC
40.0000 mg | DELAYED_RELEASE_TABLET | Freq: Every day | ORAL | Status: DC
  Administered 2023-04-29 – 2023-05-03 (×4): 40 mg via ORAL
  Filled 2023-04-29 (×4): qty 1

## 2023-04-29 MED ORDER — FENTANYL CITRATE PF 50 MCG/ML IJ SOSY
50.0000 ug | PREFILLED_SYRINGE | Freq: Once | INTRAMUSCULAR | Status: AC
  Administered 2023-04-29: 50 ug via INTRAVENOUS
  Filled 2023-04-29: qty 1

## 2023-04-29 MED ORDER — ONDANSETRON HCL 4 MG PO TABS: 4.0000 mg | ORAL_TABLET | Freq: Four times a day (QID) | ORAL | Status: DC | PRN

## 2023-04-29 MED ORDER — SODIUM CHLORIDE 0.9 % IV SOLN: INTRAVENOUS | Status: AC

## 2023-04-29 MED ORDER — POVIDONE-IODINE 10 % EX SWAB: 2.0000 | Freq: Once | CUTANEOUS | Status: DC

## 2023-04-29 MED ORDER — METHOCARBAMOL 500 MG PO TABS
750.0000 mg | ORAL_TABLET | Freq: Three times a day (TID) | ORAL | Status: DC | PRN
  Administered 2023-04-29: 750 mg via ORAL
  Filled 2023-04-29: qty 2

## 2023-04-29 MED ORDER — HYDROCODONE-ACETAMINOPHEN 5-325 MG PO TABS
1.0000 | ORAL_TABLET | ORAL | Status: DC | PRN
  Administered 2023-04-30: 2 via ORAL
  Filled 2023-04-29: qty 2

## 2023-04-29 MED ORDER — DEXAMETHASONE SODIUM PHOSPHATE 10 MG/ML IJ SOLN: 8.0000 mg | INTRAMUSCULAR | Status: DC

## 2023-04-29 MED ORDER — ONDANSETRON HCL 4 MG/2ML IJ SOLN: 4.0000 mg | Freq: Four times a day (QID) | INTRAMUSCULAR | Status: DC | PRN

## 2023-04-29 MED ORDER — MOMETASONE FURO-FORMOTEROL FUM 100-5 MCG/ACT IN AERO
2.0000 | INHALATION_SPRAY | Freq: Two times a day (BID) | RESPIRATORY_TRACT | Status: DC
  Administered 2023-04-29 – 2023-05-03 (×9): 2 via RESPIRATORY_TRACT
  Filled 2023-04-29: qty 8.8

## 2023-04-29 MED ORDER — ACETAMINOPHEN 650 MG RE SUPP: 650.0000 mg | Freq: Four times a day (QID) | RECTAL | Status: DC | PRN

## 2023-04-29 MED ORDER — CEFAZOLIN SODIUM-DEXTROSE 2-4 GM/100ML-% IV SOLN
2.0000 g | INTRAVENOUS | Status: AC
  Administered 2023-04-30: 2 g via INTRAVENOUS
  Filled 2023-04-29: qty 100

## 2023-04-29 NOTE — ED Notes (Signed)
 Patient resting in bed breathing eyes closed

## 2023-04-29 NOTE — Progress Notes (Addendum)
 PROGRESS NOTE  Paul [name]. RUE:[id number] DOB: [birth year] DOA: 04/28/2023 PCP: Tresa Garter, MD   LOS: 0 days   Brief narrative:  Paul [name]. is a 78 y.o. male with past medical history significant for hypertension, hyperlipidemia, COPD left hamstring injury presented to hospital with severe right leg swelling after mechanical fall.  This happened while he was walking the dog.  In the ED initial vitals were notable for slightly elevated blood pressure.  Lab was notable for leukocytosis with WBC at 19.0.  CK slightly elevated at 702.  CT scan of the femur on the right showed large volume anterior knee subcutaneous soft tissue hematom Patient received normal saline bolus, Dilaudid, Valium and IV Dilaudid during ED stay.a.   Orthopedics was consulted and patient was admitted hospital for further evaluation and treatment.      Assessment/Plan: Principal Problem:   Thigh hematoma Active Problems:   Dyslipidemia   Hypertension   COPD, mild (HCC)   Hemarthrosis of right knee   Intractable pain  Right knee hemarthrosis.  Left thigh hematoma.  Bilateral quadricep tendon rupture.  Status post arthrocentesis with removal of 30 cc of dark blood from left suprapatellar area in the ED from the right knee...  Orthopedic Dr. Eulah Pont was notified for further evaluation.  Continue pain control.  No evidence of compartment syndrome.  Concern for injury to extensor mechanism at either quadriceps tendon or patellar tendon of bilateral knees.  Orthopedic recommended MRI of bilateral knees and MRI of the left knee shows complete rupture of distal left quadriceps tendon and high-grade to strains of vastus lateralis.Marland Kitchen  MRI of the right knee shows a distal right quadriceps tendon rupture with complete retraction.  Keep n.p.o. after midnight likely surgical intervention on 04/30/2023.  Mild leukocytosis.  Could be reactive.  Check CBC in AM.  Hypertension, continue Diovan  Hyperlipidemia.   Off statin and Tricor.  Chronic diastolic heart failure.  Last 2D echocardiogram on 1220 with grade 1 diastolic dysfunction and LV ejection fraction of 65 to 70%.  Mildly elevated CK levels.  Will continue to monitor.  Renal function okay.  Hold statins.  Continue IV fluids.  COPD.  Follows up with pulmonary as outpatient.  Not on oxygen at baseline.   DVT prophylaxis: SCDs Start: 04/29/23 0844   Disposition: Uncertain at this time might need rehabilitation.  Status is: Inpatient Remains inpatient appropriate because: Intractable pain, thigh and knee hematoma.,  Need for surgical intervention.    Code Status:     Code Status: Full Code  Family Communication: Spoke with the patient's spouse on the phone and updated her about the clinical condition of the patient.  Consultants: Orthopedics  Procedures: Arthrocentesis of the right knee  Anti-infectives:  Cefazolin preop  Anti-infectives (From admission, onward)    Start     Dose/Rate Route Frequency Ordered Stop   04/30/23 0600  ceFAZolin (ANCEF) IVPB 2g/100 mL premix        2 g 200 mL/hr over 30 Minutes Intravenous On call to O.R. 04/29/23 1228 05/01/23 0559        Subjective: Today, patient was seen and examined at bedside.  Complains of bilateral knee and thigh pain.  Unable to move his legs.  Denies any shortness of breath dyspnea.  Objective: Vitals:   04/29/23 1043 04/29/23 1201  BP: (!) 146/79 (!) 140/81  Pulse: 89 86  Resp: 18 17  Temp: 98.3 F (36.8 C) 98.8 F (37.1 C)  SpO2: 99% 94%  No intake or output data in the 24 hours ending 04/29/23 1350 Filed Weights   04/28/23 1635  Weight: 90.7 kg   Body mass index is 27.12 kg/m.   Physical Exam:  GENERAL: Patient is alert awake and oriented. Not in obvious distress. HENT: No scleral pallor or icterus. Pupils equally reactive to light. Oral mucosa is moist NECK: is supple, no gross swelling noted. CHEST: Clear to auscultation. No crackles or  wheezes.   CVS: S1 and S2 heard, no murmur. Regular rate and rhythm.  ABDOMEN: Soft, non-tender, bowel sounds are present. EXTREMITIES: Hematoma over the lower thigh on the left, right knee covered with Ace wrap. CNS: Cranial nerves are intact.  Unable to move the bilateral lower extremities. SKIN: warm and dry without rashes.  Data Review: I have personally reviewed the following laboratory data and studies,  CBC: Recent Labs  Lab 04/28/23 1753 04/29/23 1053  WBC 19.0* 8.3  NEUTROABS 16.4*  --   HGB 16.7 15.6  HCT 49.3 47.9  MCV 93.0 96.2  PLT 234 222   Basic Metabolic Panel: Recent Labs  Lab 04/28/23 1753 04/29/23 0807  NA 137 137  K 3.5 3.9  CL 103 104  CO2 24 24  GLUCOSE 106* 109*  BUN 16 15  CREATININE 0.85 0.74  CALCIUM 8.9 8.2*  MG  --  1.8  PHOS  --  2.4*   Liver Function Tests: Recent Labs  Lab 04/28/23 1753 04/29/23 0807  AST 53* 55*  ALT 27 25  ALKPHOS 38 37*  BILITOT 0.8 1.3*  PROT 6.3* 5.8*  ALBUMIN 3.7 3.5   No results for input(s): "LIPASE", "AMYLASE" in the last 168 hours. No results for input(s): "AMMONIA" in the last 168 hours. Cardiac Enzymes: Recent Labs  Lab 04/28/23 1753 04/29/23 0807  CKTOTAL 702* 1,098*   BNP (last 3 results) No results for input(s): "BNP" in the last 8760 hours.  ProBNP (last 3 results) No results for input(s): "PROBNP" in the last 8760 hours.  CBG: No results for input(s): "GLUCAP" in the last 168 hours. No results found for this or any previous visit (from the past 240 hours).   Studies: MR KNEE LEFT WO CONTRAST Result Date: 04/29/2023 CLINICAL DATA:  Pain status post fall. EXAM: MRI OF THE LEFT KNEE WITHOUT CONTRAST TECHNIQUE: Multiplanar, multisequence MR imaging of the knee was performed. No intravenous contrast was administered. COMPARISON:  Left knee radiographs dated April 28, 2023. FINDINGS: MENISCI Medial: Intact. Lateral: Intact. LIGAMENTS Cruciates: ACL and PCL are intact. Collaterals:  Medial collateral ligament is intact. Lateral collateral ligament complex is intact. CARTILAGE Patellofemoral: Moderate to high-grade fissuring of the medial patellar facet articular cartilage. Medial: Mild partial-thickness cartilage loss of the medial femorotibial compartment. Lateral:  No chondral defect. JOINT: Moderate-sized joint effusion. Normal Hoffa's fat-pad. No plical thickening. POPLITEAL FOSSA: Popliteus tendon is intact. There is a small Baker's cyst with low signal intra-articular bodies measuring up to 9 mm. EXTENSOR MECHANISM: There is complete rupture of the distal left quadriceps tendon at the level of the superior patellar pole insertion with intervening fluid gap with hematoma and approximately 2.2 cm of retraction. The patellar tendon is intact. BONES: No aggressive osseous lesion. Fragmented and retracted osteophyte from the superior patellar pole is not clearly visualized on this exam. No additional acute fracture identified. Other: Pronounced subcutaneous edema anteriorly with hematoma. Moderate to severe edema of the vastus lateralis, rectus femoris, and vastus medialis muscles. IMPRESSION: 1. Complete rupture of the distal left quadriceps tendon  at the level of the superior patellar pole insertion with retraction and associated edema/hematoma. 2. High grade 2 strains of the vastus lateralis, vastus medialis, and rectus femoris muscles. 3. Mild degenerative changes of the knee. 4. Moderate-sized joint effusion. Small Baker's cyst with loose bodies. Electronically Signed   By: Hart Robinsons M.D.   On: 04/29/2023 12:49   MR KNEE RIGHT WO CONTRAST Result Date: 04/29/2023 CLINICAL DATA:  Pain status post fall. EXAM: MRI OF THE RIGHT KNEE WITHOUT CONTRAST TECHNIQUE: Multiplanar, multisequence MR imaging of the knee was performed. No intravenous contrast was administered. COMPARISON:  CT of the right knee and right knee radiographs dated 04/28/2023. FINDINGS: MENISCI Medial: Complex tear at  the posterior horn of the medial meniscus with extrusion into the medial gutter. A 5 x 5 mm low signal structure within the medial gutter, along the undersurface of the body of the medial meniscus, is favored to represent a displaced meniscal fragment. Lateral: Increased intrasubstance signal of the posterior horn of the lateral meniscus. LIGAMENTS Cruciates: The ACL is thickened with increased signal, most compatible with mucoid degeneration. PCL is intact. Collaterals: Medial collateral ligament is intact. Lateral collateral ligament complex is intact. CARTILAGE Patellofemoral: Suspected chondral delamination of the medial patellar facet articular cartilage above the level of the equator measuring 6 x 5 mm with moderate chondral thinning of the medial patellar facet and patellar apex. Medial: Mild partial-thickness cartilage loss of the medial femorotibial compartment. Lateral:  No chondral defect. JOINT: Small to moderate-sized joint effusion. Normal Hoffa's fat-pad. POPLITEAL FOSSA: Popliteus tendon is intact. Tiny Baker's cyst. EXTENSOR MECHANISM: There is rupture of the distal quadriceps tendon, predominantly involving the vastus medialis component which is completely torn at its insertion on the superior patellar pole with at least approximately 2.5-3 cm of retraction. High-grade tearing of the distal vastus lateralis and rectus femoris components at and proximal to the superior patellar pole insertion. Patellar tendon is intact. BONES: No aggressive osseous lesion. No fracture or dislocation. Other: Pronounced subcutaneous edema anteriorly with hematoma. Moderate edema of the visualized distal vastus lateralis and medialis muscles. IMPRESSION: 1. Distal right quadriceps tendon rupture with complete retracted insertional tear of the distal vastus medialis component and high-grade tearing of the distal vastus lateralis and rectus femoris components. 2. Complex tear of the posterior horn of the medial meniscus  with extrusion and suspected displaced meniscal fragment within the medial gutter. 3. Grade 2 strains of the vastus lateralis and medialis muscles. 4. Degeneration of the posterior horn of the lateral meniscus. 5. Mucoid degeneration of the ACL. 6. Mild degenerative changes of the knee with suspected chondral delamination of the medial patellar facet. 7. Small to moderate-sized joint effusion. Electronically Signed   By: Hart Robinsons M.D.   On: 04/29/2023 12:31   CT FEMUR RIGHT W CONTRAST Result Date: 04/28/2023 CLINICAL DATA:  Upper leg trauma R upper leg trauma r/o muscle hematoma. Fall EXAM: CT OF THE LOWER RIGHT EXTREMITY WITH CONTRAST TECHNIQUE: Multidetector CT imaging of the lower right extremity was performed according to the standard protocol following intravenous contrast administration. RADIATION DOSE REDUCTION: This exam was performed according to the departmental dose-optimization program which includes automated exposure control, adjustment of the mA and/or kV according to patient size and/or use of iterative reconstruction technique. CONTRAST:  OMNIPAQUE IOHEXOL 300 MG/ML  SOLN COMPARISON:  X-ray right knee 04/28/2023 FINDINGS: Bones/Joint/Cartilage No evidence of fracture or dislocation. Trace joint effusion. No evidence of severe arthropathy. No aggressive appearing focal bone abnormality. Ligaments  Suboptimally assessed by CT. Muscles and Tendons Grossly unremarkable. No definite heterogeneity of the musculature to suggest underlying intramuscular hematoma. No left-sided to compare symmetry. Soft tissues Large volume anterior knee subcutaneus soft tissue hematoma formation. Atherosclerotic plaque of the femoral artery and popliteal artery. Vasectomy. IMPRESSION: 1. Large volume anterior knee subcutaneus soft tissue hematoma formation. 2.  Trace joint effusion. Electronically Signed   By: Tish Frederickson M.D.   On: 04/28/2023 20:18   DG Femur Min 2 Views Right Result Date:  04/28/2023 CLINICAL DATA:  Fall, thigh pain EXAM: RIGHT FEMUR 2 VIEWS COMPARISON:  Knee radiographs 04/28/2023 FINDINGS: Atheromatous vascular calcifications in the thigh. Knee effusion with fluid in the suprapatellar bursa. Prepatellar soft tissue swelling, cannot exclude prepatellar bursitis. Indistinct distal quadriceps tendon contour, cannot exclude distal quadriceps tendon injury. IMPRESSION: 1. Knee effusion with fluid in the suprapatellar bursa. 2. Prepatellar soft tissue swelling, cannot exclude prepatellar bursitis. 3. Indistinct distal quadriceps tendon contour, cannot exclude distal quadriceps tendon injury. 4. Atheromatous vascular calcifications in the thigh. Electronically Signed   By: Gaylyn Rong M.D.   On: 04/28/2023 17:38   DG Knee Complete 4 Views Left Result Date: 04/28/2023 CLINICAL DATA:  Fall EXAM: LEFT KNEE - COMPLETE 4+ VIEW COMPARISON:  08/21/2021 FINDINGS: As on the prior exam, ossific structures posterior to the knee joint probably represent loose osteochondral fragments, possibly in a Baker's cyst. A previously fragmented osteophyte from the anterior superior patella appears proximally retracted by 3.5 cm, raising suspicion for quadriceps avulsion or rupture. Indistinct tissue planes around the distal quadriceps tendon. Suspected fluid in the suprapatellar bursa. No other fracture identified. IMPRESSION: 1. A previously fragmented osteophyte from the anterior superior patella appears proximally retracted by 3.5 cm, raising suspicion for quadriceps avulsion or rupture. 2. Suspected fluid in the suprapatellar bursa. 3. Ossific structures posterior to the knee joint probably represent loose osteochondral fragments, possibly in a Baker's cyst. Electronically Signed   By: Gaylyn Rong M.D.   On: 04/28/2023 17:36   DG Knee Complete 4 Views Right Result Date: 04/28/2023 CLINICAL DATA:  Status post fall.  Complains of right thigh pain. EXAM: RIGHT KNEE - COMPLETE 4+ VIEW  COMPARISON:  None Available. FINDINGS: Small to moderate suprapatellar joint effusion identified. No signs of acute fracture or dislocation. Mild degenerative changes identified within the patellofemoral compartment. IMPRESSION: 1. Small to moderate suprapatellar joint effusion. 2. No acute fracture. 3. Mild patellofemoral osteoarthritis. Electronically Signed   By: Signa Kell M.D.   On: 04/28/2023 17:32      Joycelyn Das, MD  [facility] 04/29/2023  If 7PM-7AM, please contact night-coverage
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
## 2023-04-29 NOTE — ED Notes (Signed)
 Provided patient with beverage at this time and removed urinal that contained 400 cc's of urine

## 2023-04-29 NOTE — ED Notes (Signed)
 Messaged provider about given patient more pain medication in between what is ordered due to his pain at this time

## 2023-04-29 NOTE — ED Notes (Signed)
 Assumed care of patient. Patient resting comfortably in bed with no signs of acute distress noted. Waiting on ready hospital bed.

## 2023-04-29 NOTE — Hospital Course (Signed)
 Paul [name]. is a 78 y.o. male with past medical history significant for hypertension, hyperlipidemia, COPD left hamstring injury presented to hospital with severe right leg swelling after mechanical fall.  This happened while he was walking the dog.  In the ED initial vitals were notable for slightly elevated blood pressure.  Lab was notable for leukocytosis with WBC at 19.0.  CK slightly elevated at 702.  CT scan of the femur on the right showed large volume anterior knee subcutaneous soft tissue hematom Patient received normal saline bolus, Dilaudid, Valium and IV Dilaudid during ED stay.a.   Orthopedics was consulted and patient was admitted hospital for further evaluation and treatment.      Right knee hemarthrosis.  Right thigh hematoma.   Status post arthrocentesis with removal of 30 cc of dark blood from left suprapatellar area in the ED..  Orthopedic Dr. Eulah Pont was notified for further evaluation.  Continue pain control.  No evidence of compartment syndrome.  Mild leukocytosis.  Could be reactive.  Check CBC in AM.  Hypertension, continue Diovan  Hyperlipidemia.  Off statin and Tricor.  Chronic diastolic heart failure.  Last 2D echocardiogram on 1220 with grade 1 diastolic dysfunction and LV ejection fraction of 65 to 70%.  Mildly elevated CK levels.  Will continue to monitor.  Renal function okay.  COPD.  Follows up with pulmonary as outpatient.  Not on oxygen at baseline.

## 2023-04-30 ENCOUNTER — Inpatient Hospital Stay (HOSPITAL_COMMUNITY): Payer: BC Managed Care – PPO | Admitting: Anesthesiology

## 2023-04-30 ENCOUNTER — Other Ambulatory Visit: Payer: Self-pay

## 2023-04-30 ENCOUNTER — Encounter (HOSPITAL_COMMUNITY)
Admission: EM | Disposition: A | Discharge status: Rehabilitation Facility | Payer: Self-pay | Source: Home / Self Care | Attending: Internal Medicine

## 2023-04-30 DIAGNOSIS — S76119A Strain of unspecified quadriceps muscle, fascia and tendon, initial encounter: Secondary | ICD-10-CM | POA: Diagnosis not present

## 2023-04-30 HISTORY — PX: QUADRICEPS TENDON REPAIR: SHX756

## 2023-04-30 LAB — CBC
HCT: 46 % (ref 39.0–52.0)
HCT: 44.1 % (ref 39.0–52.0)
Hemoglobin: 15.1 g/dL (ref 13.0–17.0)
Hemoglobin: 14.6 g/dL (ref 13.0–17.0)
MCH: 31.4 pg (ref 26.0–34.0)
MCH: 31.7 pg (ref 26.0–34.0)
MCHC: 32.8 g/dL (ref 30.0–36.0)
MCHC: 33.1 g/dL (ref 30.0–36.0)
MCV: 95.6 fL (ref 80.0–100.0)
MCV: 95.7 fL (ref 80.0–100.0)
Platelets: 208 10*3/uL (ref 150–400)
Platelets: 197 10*3/uL (ref 150–400)
RBC: 4.81 MIL/uL (ref 4.22–5.81)
RBC: 4.61 MIL/uL (ref 4.22–5.81)
RDW: 12.7 % (ref 11.5–15.5)
RDW: 12.7 % (ref 11.5–15.5)
WBC: 11.2 10*3/uL — ABNORMAL HIGH (ref 4.0–10.5)
WBC: 13.2 10*3/uL — ABNORMAL HIGH (ref 4.0–10.5)
nRBC: 0 % (ref 0.0–0.2)
nRBC: 0 % (ref 0.0–0.2)

## 2023-04-30 LAB — BASIC METABOLIC PANEL
Anion gap: 7 (ref 5–15)
BUN: 18 mg/dL (ref 8–23)
CO2: 24 mmol/L (ref 22–32)
Calcium: 8 mg/dL — ABNORMAL LOW (ref 8.9–10.3)
Chloride: 101 mmol/L (ref 98–111)
Creatinine, Ser: 0.89 mg/dL (ref 0.61–1.24)
GFR, Estimated: >60 mL/min (ref >60)
Glucose, Bld: 108 mg/dL — ABNORMAL HIGH (ref 70–99)
Potassium: 3.7 mmol/L (ref 3.5–5.1)
Sodium: 132 mmol/L — ABNORMAL LOW (ref 135–145)

## 2023-04-30 LAB — CREATININE, SERUM
Creatinine, Ser: 1.12 mg/dL (ref 0.61–1.24)
GFR, Estimated: >60 mL/min (ref >60)

## 2023-04-30 LAB — CK: Total CK: 1202 U/L — ABNORMAL HIGH (ref 49–397)

## 2023-04-30 LAB — MAGNESIUM: Magnesium: 1.7 mg/dL (ref 1.7–2.4)

## 2023-04-30 SURGERY — REPAIR, TENDON, QUADRICEPS
Anesthesia: Regional | Laterality: Bilateral

## 2023-04-30 MED ORDER — ACETAMINOPHEN 325 MG PO TABS: 325.0000 mg | ORAL_TABLET | Freq: Four times a day (QID) | ORAL | Status: DC | PRN

## 2023-04-30 MED ORDER — ONDANSETRON HCL 4 MG PO TABS: 4.0000 mg | ORAL_TABLET | Freq: Four times a day (QID) | ORAL | Status: DC | PRN

## 2023-04-30 MED ORDER — ROCURONIUM BROMIDE 10 MG/ML (PF) SYRINGE
PREFILLED_SYRINGE | INTRAVENOUS | Status: AC
  Filled 2023-04-30: qty 10

## 2023-04-30 MED ORDER — PHENYLEPHRINE 80 MCG/ML (10ML) SYRINGE FOR IV PUSH (FOR BLOOD PRESSURE SUPPORT)
PREFILLED_SYRINGE | INTRAVENOUS | Status: AC
  Filled 2023-04-30: qty 10

## 2023-04-30 MED ORDER — FENTANYL CITRATE (PF) 100 MCG/2ML IJ SOLN
INTRAMUSCULAR | Status: AC
  Filled 2023-04-30: qty 2

## 2023-04-30 MED ORDER — METOCLOPRAMIDE HCL 5 MG PO TABS: 5.0000 mg | ORAL_TABLET | Freq: Three times a day (TID) | ORAL | Status: DC | PRN

## 2023-04-30 MED ORDER — PROPOFOL 10 MG/ML IV BOLUS
INTRAVENOUS | Status: AC
  Filled 2023-04-30: qty 20

## 2023-04-30 MED ORDER — DEXAMETHASONE SODIUM PHOSPHATE 4 MG/ML IJ SOLN
INTRAMUSCULAR | Status: DC | PRN
  Administered 2023-04-30: 8 mg via INTRAVENOUS

## 2023-04-30 MED ORDER — FENTANYL CITRATE PF 50 MCG/ML IJ SOSY: 50.0000 ug | PREFILLED_SYRINGE | INTRAMUSCULAR | Status: DC

## 2023-04-30 MED ORDER — ONDANSETRON HCL 4 MG/2ML IJ SOLN
INTRAMUSCULAR | Status: DC | PRN
  Administered 2023-04-30: 4 mg via INTRAVENOUS

## 2023-04-30 MED ORDER — POLYETHYLENE GLYCOL 3350 17 G PO PACK
17.0000 g | PACK | Freq: Every day | ORAL | Status: DC | PRN
  Administered 2023-05-02: 17 g via ORAL
  Filled 2023-04-30: qty 1

## 2023-04-30 MED ORDER — ENOXAPARIN SODIUM 40 MG/0.4ML IJ SOSY
40.0000 mg | PREFILLED_SYRINGE | INTRAMUSCULAR | Status: DC
  Administered 2023-05-01 – 2023-05-03 (×3): 40 mg via SUBCUTANEOUS
  Filled 2023-04-30 (×3): qty 0.4

## 2023-04-30 MED ORDER — FINASTERIDE 5 MG PO TABS
5.0000 mg | ORAL_TABLET | Freq: Every day | ORAL | Status: DC
  Administered 2023-05-01 – 2023-05-03 (×3): 5 mg via ORAL
  Filled 2023-04-30 (×3): qty 1

## 2023-04-30 MED ORDER — 0.9 % SODIUM CHLORIDE (POUR BTL) OPTIME
TOPICAL | Status: DC | PRN
  Administered 2023-04-30: 1000 mL

## 2023-04-30 MED ORDER — ACETAMINOPHEN 500 MG PO TABS
1000.0000 mg | ORAL_TABLET | Freq: Four times a day (QID) | ORAL | Status: AC
  Administered 2023-04-30 – 2023-05-01 (×4): 1000 mg via ORAL
  Filled 2023-04-30 (×4): qty 2

## 2023-04-30 MED ORDER — DIPHENHYDRAMINE HCL 12.5 MG/5ML PO ELIX: 12.5000 mg | ORAL_SOLUTION | ORAL | Status: DC | PRN

## 2023-04-30 MED ORDER — LACTATED RINGERS IV SOLN
INTRAVENOUS | Status: DC | PRN
Start: 2023-04-30 — End: 2023-04-30

## 2023-04-30 MED ORDER — FENTANYL CITRATE PF 50 MCG/ML IJ SOSY: 25.0000 ug | PREFILLED_SYRINGE | INTRAMUSCULAR | Status: DC | PRN

## 2023-04-30 MED ORDER — OXYCODONE HCL 5 MG PO TABS
5.0000 mg | ORAL_TABLET | ORAL | Status: DC | PRN
  Administered 2023-05-01: 10 mg via ORAL
  Administered 2023-05-01: 5 mg via ORAL
  Filled 2023-04-30: qty 2
  Filled 2023-04-30: qty 1

## 2023-04-30 MED ORDER — PHENYLEPHRINE HCL (PRESSORS) 10 MG/ML IV SOLN
INTRAVENOUS | Status: DC | PRN
  Administered 2023-04-30 (×6): 160 ug via INTRAVENOUS

## 2023-04-30 MED ORDER — EPHEDRINE SULFATE (PRESSORS) 50 MG/ML IJ SOLN
INTRAMUSCULAR | Status: DC | PRN
  Administered 2023-04-30: 10 mg via INTRAVENOUS
  Administered 2023-04-30 (×3): 5 mg via INTRAVENOUS

## 2023-04-30 MED ORDER — DOCUSATE SODIUM 100 MG PO CAPS
100.0000 mg | ORAL_CAPSULE | Freq: Two times a day (BID) | ORAL | Status: DC
  Administered 2023-04-30 – 2023-05-03 (×6): 100 mg via ORAL
  Filled 2023-04-30 (×7): qty 1

## 2023-04-30 MED ORDER — FENTANYL CITRATE PF 50 MCG/ML IJ SOSY
PREFILLED_SYRINGE | INTRAMUSCULAR | Status: AC
  Administered 2023-04-30: 100 ug via INTRAVENOUS
  Filled 2023-04-30: qty 2

## 2023-04-30 MED ORDER — METHOCARBAMOL 1000 MG/10ML IJ SOLN: 500.0000 mg | Freq: Four times a day (QID) | INTRAMUSCULAR | Status: DC | PRN

## 2023-04-30 MED ORDER — EPHEDRINE 5 MG/ML INJ
INTRAVENOUS | Status: AC
  Filled 2023-04-30: qty 5

## 2023-04-30 MED ORDER — BISACODYL 10 MG RE SUPP: 10.0000 mg | Freq: Every day | RECTAL | Status: DC | PRN

## 2023-04-30 MED ORDER — DEXMEDETOMIDINE HCL IN NACL 80 MCG/20ML IV SOLN
INTRAVENOUS | Status: DC | PRN
  Administered 2023-04-30: 8 ug via INTRAVENOUS

## 2023-04-30 MED ORDER — BUPIVACAINE LIPOSOME 1.3 % IJ SUSP
INTRAMUSCULAR | Status: DC | PRN
  Administered 2023-04-30 (×2): 10 mL via PERINEURAL

## 2023-04-30 MED ORDER — TRANEXAMIC ACID-NACL 1000-0.7 MG/100ML-% IV SOLN
INTRAVENOUS | Status: DC | PRN
  Administered 2023-04-30: 1000 mg via INTRAVENOUS

## 2023-04-30 MED ORDER — CEFAZOLIN SODIUM-DEXTROSE 2-4 GM/100ML-% IV SOLN
2.0000 g | Freq: Four times a day (QID) | INTRAVENOUS | Status: AC
  Administered 2023-04-30 – 2023-05-01 (×2): 2 g via INTRAVENOUS
  Filled 2023-04-30 (×2): qty 100

## 2023-04-30 MED ORDER — FENTANYL CITRATE (PF) 100 MCG/2ML IJ SOLN
INTRAMUSCULAR | Status: AC
Start: 2023-04-30 — End: ?
  Filled 2023-04-30: qty 2

## 2023-04-30 MED ORDER — HYDROMORPHONE HCL 1 MG/ML IJ SOLN
0.5000 mg | INTRAMUSCULAR | Status: DC | PRN
  Administered 2023-05-01 – 2023-05-03 (×7): 1 mg via INTRAVENOUS
  Filled 2023-04-30 (×7): qty 1

## 2023-04-30 MED ORDER — BUPIVACAINE HCL (PF) 0.5 % IJ SOLN
INTRAMUSCULAR | Status: DC | PRN
  Administered 2023-04-30 (×2): 20 mL via PERINEURAL

## 2023-04-30 MED ORDER — METOCLOPRAMIDE HCL 5 MG/ML IJ SOLN: 5.0000 mg | Freq: Three times a day (TID) | INTRAMUSCULAR | Status: DC | PRN

## 2023-04-30 MED ORDER — FENTANYL CITRATE (PF) 100 MCG/2ML IJ SOLN
INTRAMUSCULAR | Status: DC | PRN
Start: 2023-04-30 — End: 2023-04-30
  Administered 2023-04-30: 25 ug via INTRAVENOUS
  Administered 2023-04-30: 50 ug via INTRAVENOUS
  Administered 2023-04-30: 25 ug via INTRAVENOUS

## 2023-04-30 MED ORDER — OXYCODONE HCL 5 MG PO TABS
10.0000 mg | ORAL_TABLET | ORAL | Status: DC | PRN
  Administered 2023-05-01: 15 mg via ORAL
  Administered 2023-05-01: 10 mg via ORAL
  Administered 2023-05-01 – 2023-05-03 (×10): 15 mg via ORAL
  Filled 2023-04-30 (×5): qty 3
  Filled 2023-04-30: qty 2
  Filled 2023-04-30 (×6): qty 3

## 2023-04-30 MED ORDER — METHOCARBAMOL 500 MG PO TABS
500.0000 mg | ORAL_TABLET | Freq: Four times a day (QID) | ORAL | Status: DC | PRN
  Administered 2023-05-01 – 2023-05-03 (×8): 500 mg via ORAL
  Filled 2023-04-30 (×9): qty 1

## 2023-04-30 MED ORDER — TRANEXAMIC ACID-NACL 1000-0.7 MG/100ML-% IV SOLN
INTRAVENOUS | Status: AC
Start: 2023-04-30 — End: 2023-05-01
  Filled 2023-04-30: qty 100

## 2023-04-30 MED ORDER — PROPOFOL 10 MG/ML IV BOLUS
INTRAVENOUS | Status: DC | PRN
  Administered 2023-04-30: 150 mg via INTRAVENOUS

## 2023-04-30 MED ORDER — ONDANSETRON HCL 4 MG/2ML IJ SOLN: 4.0000 mg | Freq: Four times a day (QID) | INTRAMUSCULAR | Status: DC | PRN

## 2023-04-30 SURGICAL SUPPLY — 47 items
BAG COUNTER SPONGE SURGICOUNT (BAG) IMPLANT
BANDAGE ESMARK 6X9 LF (GAUZE/BANDAGES/DRESSINGS) ×1 IMPLANT
BIT DRILL 2.0X128 (BIT) ×1 IMPLANT
BLADE SURG SZ10 CARB STEEL (BLADE) ×1 IMPLANT
BNDG COHESIVE 6X5 TAN ST LF (GAUZE/BANDAGES/DRESSINGS) ×1 IMPLANT
BNDG ELASTIC 6INX 5YD STR LF (GAUZE/BANDAGES/DRESSINGS) ×1 IMPLANT
BNDG ELASTIC 6X10 VLCR STRL LF (GAUZE/BANDAGES/DRESSINGS) IMPLANT
BNDG ESMARK 6X9 LF (GAUZE/BANDAGES/DRESSINGS) ×1 IMPLANT
CHLORAPREP W/TINT 26 (MISCELLANEOUS) ×1 IMPLANT
CLSR STERI-STRIP ANTIMIC 1/2X4 (GAUZE/BANDAGES/DRESSINGS) ×1 IMPLANT
CUFF TRNQT CYL 34X4.125X (TOURNIQUET CUFF) ×1 IMPLANT
DRAPE BILATERAL LIMB T (DRAPES) IMPLANT
DRAPE U-SHAPE 47X51 STRL (DRAPES) ×1 IMPLANT
DRSG ADAPTIC 3X8 NADH LF (GAUZE/BANDAGES/DRESSINGS) ×1 IMPLANT
DRSG MEPILEX POST OP 4X8 (GAUZE/BANDAGES/DRESSINGS) IMPLANT
ELECT REM PT RETURN 15FT ADLT (MISCELLANEOUS) ×1 IMPLANT
GAUZE PAD ABD 8X10 STRL (GAUZE/BANDAGES/DRESSINGS) ×1 IMPLANT
GAUZE SPONGE 4X4 12PLY STRL (GAUZE/BANDAGES/DRESSINGS) ×1 IMPLANT
GLOVE BIO SURGEON STRL SZ7.5 (GLOVE) ×2 IMPLANT
GLOVE BIOGEL PI IND STRL 7.5 (GLOVE) ×1 IMPLANT
GLOVE BIOGEL PI IND STRL 8 (GLOVE) ×1 IMPLANT
GLOVE INDICATOR 7.5 STRL GRN (GLOVE) ×1 IMPLANT
GOWN STRL REUS W/ TWL LRG LVL3 (GOWN DISPOSABLE) ×1 IMPLANT
IMMOBILIZER KNEE 20 (SOFTGOODS) ×2 IMPLANT
IMMOBILIZER KNEE 20 THIGH 36 (SOFTGOODS) IMPLANT
IMMOBILIZER KNEE 22 UNIV (SOFTGOODS) IMPLANT
NDL MA TROC 1/2 (NEEDLE) ×1 IMPLANT
NDL SUT 6 .5 CRC .975X.05 MAYO (NEEDLE) IMPLANT
NEEDLE MA TROC 1/2 (NEEDLE) ×1 IMPLANT
NS IRRIG 1000ML POUR BTL (IV SOLUTION) ×1 IMPLANT
PACK ORTHO EXTREMITY (CUSTOM PROCEDURE TRAY) ×1 IMPLANT
PADDING CAST COTTON 6X4 STRL (CAST SUPPLIES) ×1 IMPLANT
RETRIEVER SUT HEWSON (MISCELLANEOUS) IMPLANT
SPIKE FLUID TRANSFER (MISCELLANEOUS) IMPLANT
STOCKINETTE 8 INCH (MISCELLANEOUS) ×1 IMPLANT
SUT FIBERWIRE #2 38 REV NDL BL (SUTURE) IMPLANT
SUT MNCRL AB 4-0 PS2 18 (SUTURE) ×1 IMPLANT
SUT VIC AB 0 CT1 27XBRD ANBCTR (SUTURE) IMPLANT
SUT VIC AB 0 CT1 36 (SUTURE) IMPLANT
SUT VIC AB 1 CT1 36 (SUTURE) ×2 IMPLANT
SUT VIC AB 2-0 CT1 TAPERPNT 27 (SUTURE) IMPLANT
SUT VIC AB 2-0 SH 27XBRD (SUTURE) ×2 IMPLANT
SUTURE FIBERWR#2 38 REV NDL BL (SUTURE) IMPLANT
TOWEL OR 17X26 10 PK STRL BLUE (TOWEL DISPOSABLE) ×1 IMPLANT
UNDERPAD 30X36 HEAVY ABSORB (UNDERPADS AND DIAPERS) ×1 IMPLANT
WRAP KNEE MAXI GEL POST OP (GAUZE/BANDAGES/DRESSINGS) IMPLANT
YANKAUER SUCT BULB TIP 10FT TU (MISCELLANEOUS) ×1 IMPLANT

## 2023-04-30 NOTE — Plan of Care (Signed)

## 2023-04-30 NOTE — Anesthesia Preprocedure Evaluation (Addendum)
 Anesthesia Evaluation  Patient identified by MRN, date of birth, ID band Patient awake    Reviewed: Allergy & Precautions, NPO status , Patient's Chart, lab work & pertinent test results  Airway Mallampati: II  TM Distance: >3 FB Neck ROM: Full    Dental no notable dental hx. (+) Teeth Intact, Dental Advisory Given   Pulmonary asthma , COPD,  COPD inhaler, former smoker   Pulmonary exam normal breath sounds clear to auscultation       Cardiovascular hypertension, Pt. on medications + CAD  Normal cardiovascular exam Rhythm:Regular Rate:Normal     Neuro/Psych  Headaches PSYCHIATRIC DISORDERS Anxiety Depression       GI/Hepatic ,GERD  Medicated,,(+) Hepatitis -, C  Endo/Other  negative endocrine ROS    Renal/GU negative Renal ROS  negative genitourinary   Musculoskeletal negative musculoskeletal ROS (+)    Abdominal   Peds  Hematology negative hematology ROS (+)   Anesthesia Other Findings   Reproductive/Obstetrics                             Anesthesia Physical Anesthesia Plan  ASA: 2  Anesthesia Plan: General and Regional   Post-op Pain Management: Regional block* and Tylenol PO (pre-op)*   Induction: Intravenous  PONV Risk Score and Plan: 2 and Ondansetron, Dexamethasone and Treatment may vary due to age or medical condition  Airway Management Planned: LMA  Additional Equipment:   Intra-op Plan:   Post-operative Plan: Extubation in OR  Informed Consent: I have reviewed the patients History and Physical, chart, labs and discussed the procedure including the risks, benefits and alternatives for the proposed anesthesia with the patient or authorized representative who has indicated his/her understanding and acceptance.     Dental advisory given  Plan Discussed with: CRNA  Anesthesia Plan Comments:        Anesthesia Quick Evaluation

## 2023-04-30 NOTE — Discharge Instructions (Signed)

## 2023-04-30 NOTE — Transfer of Care (Signed)
 Immediate Anesthesia Transfer of Care Note  Patient: Paul [name].  Procedure(s) Performed: Procedure(s): BILATERAL QUADRICEP TENDON REPAIR (Bilateral)  Patient Location: PACU  Anesthesia Type:General  Level of Consciousness:  sedated, patient cooperative and responds to stimulation  Airway & Oxygen Therapy:Patient Spontanous Breathing and Patient connected to face mask oxgen  Post-op Assessment:  Report given to PACU RN and Post -op Vital signs reviewed and stable  Post vital signs:  Reviewed and stable  Last Vitals:  Vitals:   04/30/23 1303 04/30/23 1540  BP:    Pulse: 75 80  Resp: (!) 21 (!) 21  Temp:    SpO2: 96% 98%    Complications: No apparent anesthesia complications

## 2023-04-30 NOTE — Anesthesia Procedure Notes (Addendum)
 Anesthesia Regional Block: Femoral nerve block   Pre-Anesthetic Checklist: , timeout performed,  Correct Patient, Correct Site, Correct Laterality,  Correct Procedure, Correct Position, site marked,  Risks and benefits discussed,  Pre-op evaluation,  At surgeon's request and post-op pain management  Laterality: Left  Prep: Maximum Sterile Barrier Precautions used, chloraprep       Needles:  Injection technique: Single-shot  Needle Type: Echogenic Stimulator Needle     Needle Length: 9cm  Needle Gauge: 21     Additional Needles:   Procedures:,,,, ultrasound used (permanent image in chart),,    Narrative:  Start time: 04/30/2023 12:50 PM End time: 04/30/2023 12:55 PM Injection made incrementally with aspirations every 5 mL. Anesthesiologist: Elmer Picker, MD

## 2023-04-30 NOTE — Anesthesia Procedure Notes (Signed)
 Procedure Name: LMA Insertion Date/Time: 04/30/2023 1:42 PM  Performed by: Theodosia Quay, CRNAPre-anesthesia Checklist: Patient identified, Emergency Drugs available, Suction available, Patient being monitored and Timeout performed Patient Re-evaluated:Patient Re-evaluated prior to induction Oxygen Delivery Method: Circle system utilized Preoxygenation: Pre-oxygenation with 100% oxygen Induction Type: IV induction Ventilation: Mask ventilation without difficulty LMA: LMA inserted LMA Size: 4.0 Tube size: 4.0 mm Number of attempts: 1 Placement Confirmation: positive ETCO2 and breath sounds checked- equal and bilateral Tube secured with: Tape Dental Injury: Teeth and Oropharynx as per pre-operative assessment

## 2023-04-30 NOTE — Plan of Care (Signed)

## 2023-04-30 NOTE — Anesthesia Procedure Notes (Addendum)
 Anesthesia Regional Block: Femoral nerve block   Pre-Anesthetic Checklist: , timeout performed,  Correct Patient, Correct Site, Correct Laterality,  Correct Procedure, Correct Position, site marked,  Risks and benefits discussed,  Pre-op evaluation,  At surgeon's request and post-op pain management  Laterality: Right  Prep: Maximum Sterile Barrier Precautions used, chloraprep       Needles:  Injection technique: Single-shot  Needle Type: Echogenic Stimulator Needle     Needle Length: 9cm  Needle Gauge: 21     Additional Needles:   Procedures:,,,, ultrasound used (permanent image in chart),,    Narrative:  Start time: 04/30/2023 12:44 PM End time: 04/30/2023 12:48 PM Injection made incrementally with aspirations every 5 mL. Anesthesiologist: Elmer Picker, MD

## 2023-04-30 NOTE — Progress Notes (Signed)
 PROGRESS NOTE Paul [name].  ZOX:[id number] DOB: 1945/12/26 DOA: 04/28/2023 PCP: Tresa Garter, MD  Brief Narrative/Hospital Course: 68 yom w/ Hx of  HTN HLD , COPD, left hamstring injury presented to hospital with severe right leg swelling after mechanical fall while walking the dog. In the ED: Initially slightly low blood pressure labs showed leukocytosis at 19.0.  CK slightly elevated at 702. CT scan of the femur on the right  > showed large volume anterior knee subcutaneous soft tissue hematom Placed on IV fluids pain management orthopedic consulted and admitted    Subjective: Seen and examined this morning Overall feels well reports pain medicine not lasting longer, some more pain on proximal right hip Overnight patient afebrile BP stable on room air. Labs reviewed mild hyponatremia leukocytosis improved last ck 702> 1098   Assessment and Plan: Principal Problem:   Rupture of bilateral distal quadriceps tendon Active Problems:   Dyslipidemia   Hypertension   COPD, mild (HCC)   Thigh hematoma   Hemarthrosis of right knee   Intractable pain    Right knee hemarthrosis Right thigh hematoma Distal right quadricep tendon rupture/complex tear of the posterior horn of the medial meniscus Complete rupture of the distal left quadricep tendon, high-grade 2 strains of the vastus lateralis vastus medialis and rectus femoris: Concern for injury to extensor mechanism and MRI  knees positive see the result-with significant muscle tear injuries. Continue pain management compression ice and POC as per orthopedics- OR today?.Add oral pain regimen to IV.  Mild leukocytosis: Resolved.  Monitor  Hypertension: Cont Diovan  HLD: Hold off statins and tricor  Chronic diastolic heart failure: Last echo with G1 DD EF 65 to 70%, monitor volume status  Mild traumatic rhabdomyolysis: Monitor CK level  COPD: Not in exacerbation followed by pulmonary outpatient continue inhaler as  needed  DVT prophylaxis: SCDs Start: 04/29/23 0844 Code Status:   Code Status: Full Code Family Communication: plan of care discussed with patient at bedside. Patient status is: Remains hospitalized because of severity of illness Level of care: Telemetry   Dispo: The patient is from: Home            Anticipated disposition: TBD Objective: Vitals last 24 hrs: Vitals:   04/30/23 0051 04/30/23 0523 04/30/23 0852 04/30/23 1148  BP: 122/60 115/70  132/76  Pulse: 82 78  85  Resp: 18 18  16   Temp: 97.7 F (36.5 C) 98.4 F (36.9 C)  98.4 F (36.9 C)  TempSrc:    Oral  SpO2: 97% 97% 97% 95%  Weight:      Height:       Weight change:   Physical Examination: General exam: alert awake,at baseline, older than stated age HEENT:Oral mucosa moist, Ear/Nose WNL grossly Respiratory system: Bilaterally clear BS,no use of accessory muscle Cardiovascular system: S1 & S2 +, No JVD. Gastrointestinal system: Abdomen soft,NT,ND, BS+ Nervous System: Alert, awake, moving all extremities,and following commands. Extremities: Discolored bilateral knee area with tenderness  LE edema neg,distal peripheral pulses palpable and warm.  Skin: No rashes,no icterus. MSK: Normal muscle bulk,tone, power   Medications reviewed:  Scheduled Meds:  dexamethasone (DECADRON) injection  8 mg Intravenous On Call to OR   [MAR Hold] irbesartan  150 mg Oral Daily   [MAR Hold] mometasone-formoterol  2 puff Inhalation BID   [MAR Hold] pantoprazole  40 mg Oral Daily   povidone-iodine  2 Application Topical Once   Continuous Infusions:  sodium chloride Stopped (04/29/23 0916)    ceFAZolin (  ANCEF) IV        Diet Order             Diet NPO time specified Except for: Sips with Meds  Diet effective midnight                   Intake/Output Summary (Last 24 hours) at 04/30/2023 1206 Last data filed at 04/30/2023 0341 Gross per 24 hour  Intake --  Output 350 ml  Net -350 ml   Net IO Since Admission: -350 mL  [04/30/23 1206]  Wt Readings from Last 3 Encounters:  04/28/23 90.7 kg  04/16/23 91.6 kg  01/16/23 91.2 kg     Unresulted Labs (From admission, onward)     Start     Ordered   05/01/23 0500  Basic metabolic panel  Daily,   R      04/30/23 0903   05/01/23 0500  CBC  Daily,   R      04/30/23 0903   05/01/23 0500  CK  Tomorrow morning,   R        04/30/23 0903          Data Reviewed: I have personally reviewed following labs and imaging studies CBC: Recent Labs  Lab 04/28/23 1753 04/29/23 1053 04/30/23 0544  WBC 19.0* 8.3 11.2*  NEUTROABS 16.4*  --   --   HGB 16.7 15.6 15.1  HCT 49.3 47.9 46.0  MCV 93.0 96.2 95.6  PLT 234 222 208   Basic Metabolic Panel:  Recent Labs  Lab 04/28/23 1753 04/29/23 0807 04/30/23 0544  NA 137 137 132*  K 3.5 3.9 3.7  CL 103 104 101  CO2 24 24 24   GLUCOSE 106* 109* 108*  BUN 16 15 18   CREATININE 0.85 0.74 0.89  CALCIUM 8.9 8.2* 8.0*  MG  --  1.8 1.7  PHOS  --  2.4*  --    GFR: Estimated Creatinine Clearance: 75.1 mL/min (by C-G formula based on SCr of 0.89 mg/dL). Liver Function Tests:  Recent Labs  Lab 04/28/23 1753 04/29/23 0807  AST 53* 55*  ALT 27 25  ALKPHOS 38 37*  BILITOT 0.8 1.3*  PROT 6.3* 5.8*  ALBUMIN 3.7 3.5  Sepsis Labs: No results for input(s): "PROCALCITON", "LATICACIDVEN" in the last 168 hours. No results found for this or any previous visit (from the past 240 hours).  Antimicrobials/Microbiology: Anti-infectives (From admission, onward)    Start     Dose/Rate Route Frequency Ordered Stop   04/30/23 1300  ceFAZolin (ANCEF) IVPB 2g/100 mL premix        2 g 200 mL/hr over 30 Minutes Intravenous On call to O.R. 04/29/23 1228 05/01/23 0559      No results found for: "SDES", "SPECREQUEST", "CULT", "REPTSTATUS"   Radiology Studies: MR KNEE LEFT WO CONTRAST Result Date: 04/29/2023 CLINICAL DATA:  Pain status post fall. EXAM: MRI OF THE LEFT KNEE WITHOUT CONTRAST TECHNIQUE: Multiplanar, multisequence MR  imaging of the knee was performed. No intravenous contrast was administered. COMPARISON:  Left knee radiographs dated April 28, 2023. FINDINGS: MENISCI Medial: Intact. Lateral: Intact. LIGAMENTS Cruciates: ACL and PCL are intact. Collaterals: Medial collateral ligament is intact. Lateral collateral ligament complex is intact. CARTILAGE Patellofemoral: Moderate to high-grade fissuring of the medial patellar facet articular cartilage. Medial: Mild partial-thickness cartilage loss of the medial femorotibial compartment. Lateral:  No chondral defect. JOINT: Moderate-sized joint effusion. Normal Hoffa's fat-pad. No plical thickening. POPLITEAL FOSSA: Popliteus tendon is intact. There is a small  Baker's cyst with low signal intra-articular bodies measuring up to 9 mm. EXTENSOR MECHANISM: There is complete rupture of the distal left quadriceps tendon at the level of the superior patellar pole insertion with intervening fluid gap with hematoma and approximately 2.2 cm of retraction. The patellar tendon is intact. BONES: No aggressive osseous lesion. Fragmented and retracted osteophyte from the superior patellar pole is not clearly visualized on this exam. No additional acute fracture identified. Other: Pronounced subcutaneous edema anteriorly with hematoma. Moderate to severe edema of the vastus lateralis, rectus femoris, and vastus medialis muscles. IMPRESSION: 1. Complete rupture of the distal left quadriceps tendon at the level of the superior patellar pole insertion with retraction and associated edema/hematoma. 2. High grade 2 strains of the vastus lateralis, vastus medialis, and rectus femoris muscles. 3. Mild degenerative changes of the knee. 4. Moderate-sized joint effusion. Small Baker's cyst with loose bodies. Electronically Signed   By: Hart Robinsons M.D.   On: 04/29/2023 12:49   MR KNEE RIGHT WO CONTRAST Result Date: 04/29/2023 CLINICAL DATA:  Pain status post fall. EXAM: MRI OF THE RIGHT KNEE WITHOUT  CONTRAST TECHNIQUE: Multiplanar, multisequence MR imaging of the knee was performed. No intravenous contrast was administered. COMPARISON:  CT of the right knee and right knee radiographs dated 04/28/2023. FINDINGS: MENISCI Medial: Complex tear at the posterior horn of the medial meniscus with extrusion into the medial gutter. A 5 x 5 mm low signal structure within the medial gutter, along the undersurface of the body of the medial meniscus, is favored to represent a displaced meniscal fragment. Lateral: Increased intrasubstance signal of the posterior horn of the lateral meniscus. LIGAMENTS Cruciates: The ACL is thickened with increased signal, most compatible with mucoid degeneration. PCL is intact. Collaterals: Medial collateral ligament is intact. Lateral collateral ligament complex is intact. CARTILAGE Patellofemoral: Suspected chondral delamination of the medial patellar facet articular cartilage above the level of the equator measuring 6 x 5 mm with moderate chondral thinning of the medial patellar facet and patellar apex. Medial: Mild partial-thickness cartilage loss of the medial femorotibial compartment. Lateral:  No chondral defect. JOINT: Small to moderate-sized joint effusion. Normal Hoffa's fat-pad. POPLITEAL FOSSA: Popliteus tendon is intact. Tiny Baker's cyst. EXTENSOR MECHANISM: There is rupture of the distal quadriceps tendon, predominantly involving the vastus medialis component which is completely torn at its insertion on the superior patellar pole with at least approximately 2.5-3 cm of retraction. High-grade tearing of the distal vastus lateralis and rectus femoris components at and proximal to the superior patellar pole insertion. Patellar tendon is intact. BONES: No aggressive osseous lesion. No fracture or dislocation. Other: Pronounced subcutaneous edema anteriorly with hematoma. Moderate edema of the visualized distal vastus lateralis and medialis muscles. IMPRESSION: 1. Distal right  quadriceps tendon rupture with complete retracted insertional tear of the distal vastus medialis component and high-grade tearing of the distal vastus lateralis and rectus femoris components. 2. Complex tear of the posterior horn of the medial meniscus with extrusion and suspected displaced meniscal fragment within the medial gutter. 3. Grade 2 strains of the vastus lateralis and medialis muscles. 4. Degeneration of the posterior horn of the lateral meniscus. 5. Mucoid degeneration of the ACL. 6. Mild degenerative changes of the knee with suspected chondral delamination of the medial patellar facet. 7. Small to moderate-sized joint effusion. Electronically Signed   By: Hart Robinsons M.D.   On: 04/29/2023 12:31   CT FEMUR RIGHT W CONTRAST Result Date: 04/28/2023 CLINICAL DATA:  Upper leg trauma R upper  leg trauma r/o muscle hematoma. Fall EXAM: CT OF THE LOWER RIGHT EXTREMITY WITH CONTRAST TECHNIQUE: Multidetector CT imaging of the lower right extremity was performed according to the standard protocol following intravenous contrast administration. RADIATION DOSE REDUCTION: This exam was performed according to the departmental dose-optimization program which includes automated exposure control, adjustment of the mA and/or kV according to patient size and/or use of iterative reconstruction technique. CONTRAST:  OMNIPAQUE IOHEXOL 300 MG/ML  SOLN COMPARISON:  X-ray right knee 04/28/2023 FINDINGS: Bones/Joint/Cartilage No evidence of fracture or dislocation. Trace joint effusion. No evidence of severe arthropathy. No aggressive appearing focal bone abnormality. Ligaments Suboptimally assessed by CT. Muscles and Tendons Grossly unremarkable. No definite heterogeneity of the musculature to suggest underlying intramuscular hematoma. No left-sided to compare symmetry. Soft tissues Large volume anterior knee subcutaneus soft tissue hematoma formation. Atherosclerotic plaque of the femoral artery and popliteal  artery. Vasectomy. IMPRESSION: 1. Large volume anterior knee subcutaneus soft tissue hematoma formation. 2.  Trace joint effusion. Electronically Signed   By: Tish Frederickson M.D.   On: 04/28/2023 20:18   DG Femur Min 2 Views Right Result Date: 04/28/2023 CLINICAL DATA:  Fall, thigh pain EXAM: RIGHT FEMUR 2 VIEWS COMPARISON:  Knee radiographs 04/28/2023 FINDINGS: Atheromatous vascular calcifications in the thigh. Knee effusion with fluid in the suprapatellar bursa. Prepatellar soft tissue swelling, cannot exclude prepatellar bursitis. Indistinct distal quadriceps tendon contour, cannot exclude distal quadriceps tendon injury. IMPRESSION: 1. Knee effusion with fluid in the suprapatellar bursa. 2. Prepatellar soft tissue swelling, cannot exclude prepatellar bursitis. 3. Indistinct distal quadriceps tendon contour, cannot exclude distal quadriceps tendon injury. 4. Atheromatous vascular calcifications in the thigh. Electronically Signed   By: Gaylyn Rong M.D.   On: 04/28/2023 17:38   DG Knee Complete 4 Views Left Result Date: 04/28/2023 CLINICAL DATA:  Fall EXAM: LEFT KNEE - COMPLETE 4+ VIEW COMPARISON:  08/21/2021 FINDINGS: As on the prior exam, ossific structures posterior to the knee joint probably represent loose osteochondral fragments, possibly in a Baker's cyst. A previously fragmented osteophyte from the anterior superior patella appears proximally retracted by 3.5 cm, raising suspicion for quadriceps avulsion or rupture. Indistinct tissue planes around the distal quadriceps tendon. Suspected fluid in the suprapatellar bursa. No other fracture identified. IMPRESSION: 1. A previously fragmented osteophyte from the anterior superior patella appears proximally retracted by 3.5 cm, raising suspicion for quadriceps avulsion or rupture. 2. Suspected fluid in the suprapatellar bursa. 3. Ossific structures posterior to the knee joint probably represent loose osteochondral fragments, possibly in a Baker's  cyst. Electronically Signed   By: Gaylyn Rong M.D.   On: 04/28/2023 17:36   DG Knee Complete 4 Views Right Result Date: 04/28/2023 CLINICAL DATA:  Status post fall.  Complains of right thigh pain. EXAM: RIGHT KNEE - COMPLETE 4+ VIEW COMPARISON:  None Available. FINDINGS: Small to moderate suprapatellar joint effusion identified. No signs of acute fracture or dislocation. Mild degenerative changes identified within the patellofemoral compartment. IMPRESSION: 1. Small to moderate suprapatellar joint effusion. 2. No acute fracture. 3. Mild patellofemoral osteoarthritis. Electronically Signed   By: Signa Kell M.D.   On: 04/28/2023 17:32     LOS: 1 day   Total time spent in review of labs and imaging, patient evaluation, formulation of plan, documentation and communication with family: 35 minutes  Lanae Boast, MD  [facility]  04/30/2023, 12:06 PM

## 2023-04-30 NOTE — Op Note (Signed)
 PRE-OPERATIVE DIAGNOSIS:  Bilateral Quad Tendon Rupture   POST-OPERATIVE DIAGNOSIS:  Same   PROCEDURE:  QUADRICEP TENDON REPAIR   SURGEON:  Sheral Apley, MD   ASSISTANT: Levester Fresh, PA-C, he was present and scrubbed throughout the case, critical for completion in a timely fashion, and for retraction, instrumentation, and closure.     ANESTHESIA:   gen   PREOPERATIVE INDICATIONS: He is a male with a diagnosis of Bilateral Quad Tendon Rupture who elected for surgical management.     The risks benefits and alternatives were discussed with the patient preoperatively including but not limited to the risks of infection, bleeding, nerve injury, cardiopulmonary complications, the need for revision surgery, among others, and the patient was willing to proceed.   OPERATIVE FINDINGS: complete rupture   BLOOD LOSS: min   TOURNIQUET TIME: 75   OPERATIVE PROCEDURE:  Patient was identified in the preoperative holding area and site was marked by me He was transported to the operating theater and placed on the table in supine position taking care to pad all bony prominences. After a preincinduction time out anesthesia was induced. The left lower extremity was prepped and draped in normal sterile fashion and a pre-incision timeout was performed. He received ancef for preoperative antibiotics.    I made an incision directly over his traumatic injury. I dissected down to the level of the peritenon and elevated skin flaps over top of this there were full-thickness.   I then incised the peritenon it was partially ruptured as well. Identified his rupture tendon.   I debrided tendon from the patella allowing a good bony bed for healing.   I then used 2 #2 FiberWire as a whipstitched up and back in the Tendon leaving me with 4 total strands coming out.   I thoroughly irrigated the joint   Next I used a drill bit to drill 3 holes in the patella taking care to not penetrate the articular surface. I  used a [place] suture passer to pass all 4 stitches through these holes.   The patella reapproximated well to the tendon. I tied the stitches over top of the bone bridge of the patella.   I then stressed the repair and it was stable to 45 degrees.   I then thoroughly irrigated the wound again.   I repaired the medial collateral capsule with a vicryl stitch   I then repaired the lateral collateral capsule with a vicryl stitch   I closed the ruptured peritenon as well as the surgically incised peritenon with an 0 Vicryl. Then closed the skin with a monocryl stitch   Sterile dressing was applied the knee was placed in a knee immobilizer and taken the PACU in stable condition.   POST OPERATIVE PLAN: WBAT in immobilizer, DVT px: early ambulation and chemical px   04/12/2023 - 04/14/2023   I then turned my attention to the R leg and perfroming the below mentioned procedure    The Right lower extremity was prepped and draped in normal sterile fashion and a pre-incision timeout was performed. He received ancef for preoperative antibiotics.    I made an incision directly over his traumatic injury. I dissected down to the level of the peritenon and elevated skin flaps over top of this there were full-thickness.   I then incised the peritenon it was partially ruptured as well. Identified his rupture tendon.   I debrided tendon from the patella allowing a good bony bed for healing.   I then used  2 #2 FiberWire as a whipstitched up and back in the Tendon leaving me with 4 total strands coming out.   I thoroughly irrigated the joint   Next I used a drill bit to drill 3 holes in the patella taking care to not penetrate the articular surface. I used a [place] suture passer to pass all 4 stitches through these holes.   The patella reapproximated well to the tendon. I tied the stitches over top of the bone bridge of the patella.   I then stressed the repair and it was stable to 45 degrees.   I  then thoroughly irrigated the wound again.   I repaired the medial collateral capsule with a vicryl stitch   I then repaired the lateral collateral capsule with a vicryl stitch   I closed the ruptured peritenon as well as the surgically incised peritenon with an 0 Vicryl. Then closed the skin with a monocryl stitch   Sterile dressing was applied the knee was placed in a knee immobilizer and taken the PACU in stable condition.   POST OPERATIVE PLAN: WBAT in immobilizer for transfers and protected gait with a walker, DVT px: early ambulation and chemical px

## 2023-05-01 DIAGNOSIS — S76119A Strain of unspecified quadriceps muscle, fascia and tendon, initial encounter: Secondary | ICD-10-CM | POA: Diagnosis not present

## 2023-05-01 LAB — BASIC METABOLIC PANEL
Anion gap: 8 (ref 5–15)
BUN: 24 mg/dL — ABNORMAL HIGH (ref 8–23)
CO2: 23 mmol/L (ref 22–32)
Calcium: 7.9 mg/dL — ABNORMAL LOW (ref 8.9–10.3)
Chloride: 103 mmol/L (ref 98–111)
Creatinine, Ser: 1.04 mg/dL (ref 0.61–1.24)
GFR, Estimated: >60 mL/min (ref >60)
Glucose, Bld: 144 mg/dL — ABNORMAL HIGH (ref 70–99)
Potassium: 3.8 mmol/L (ref 3.5–5.1)
Sodium: 134 mmol/L — ABNORMAL LOW (ref 135–145)

## 2023-05-01 LAB — CBC
HCT: 42.2 % (ref 39.0–52.0)
Hemoglobin: 14 g/dL (ref 13.0–17.0)
MCH: 31.9 pg (ref 26.0–34.0)
MCHC: 33.2 g/dL (ref 30.0–36.0)
MCV: 96.1 fL (ref 80.0–100.0)
Platelets: 214 10*3/uL (ref 150–400)
RBC: 4.39 MIL/uL (ref 4.22–5.81)
RDW: 12.5 % (ref 11.5–15.5)
WBC: 14.4 10*3/uL — ABNORMAL HIGH (ref 4.0–10.5)
nRBC: 0 % (ref 0.0–0.2)

## 2023-05-01 LAB — CK: Total CK: 1553 U/L — ABNORMAL HIGH (ref 49–397)

## 2023-05-01 MED ORDER — CHLORPROMAZINE HCL 25 MG PO TABS
25.0000 mg | ORAL_TABLET | Freq: Three times a day (TID) | ORAL | Status: AC | PRN
  Administered 2023-05-02 – 2023-05-03 (×3): 25 mg via ORAL
  Filled 2023-05-01 (×5): qty 1

## 2023-05-01 NOTE — Evaluation (Signed)
 Occupational Therapy Evaluation Patient Details Name: Paul [name]. MRN: [id number] DOB: [birth year] Today's Date: 05/01/2023   History of Present Illness   Pt is a 78 y.o. male who fell onto B knees while side stepping on wet grass walking his dog and hyperflexed B knees sustaining a B quad tendon tear and large hematoma. S/p operative repair and is now WBAT in immobilizers for transfers only otherwise NWB PMH: HTN HLD, COPD, CHF     Clinical Impressions Pt seen for skilled OT initial evaluation. + 2 for safety and mobility required. Pt open to all therapy presented and rec'd pain meds by nursing prior. Pt educated in [employer] and need for B KI on at all times. Able to mobilize with max A + 2 from EOB to recliner. Educated pt and nursing for use of lateral scoot transfer to [facility] or bed. Pt presents with decreased activity tolerance and balance with pain and WB precautions limiting ADL's and mobility. Pt will have 24 support from wife at home. Discussed DME needs of w/c, RW, and DABSC as well as potential to rent seat lift recliner as pt able to set up on 1st floor.  Pt will continue to require OT while in acute to address LB AE training and mobility. Pt will benefit from [facility] upon d/c home.      If plan is discharge home, recommend the following:   A lot of help with walking and/or transfers;A lot of help with bathing/dressing/bathroom;Assistance with cooking/housework;Assist for transportation;Help with stairs or ramp for entrance     Functional Status Assessment   Patient has had a recent decline in their functional status and demonstrates the ability to make significant improvements in function in a reasonable and predictable amount of time.     Equipment Recommendations   BSC/3in1;Other (comment) (DABSC)      Precautions/Restrictions   Precautions Precautions: Knee Precaution/Restrictions Comments: B knee immobilizers Required Braces or Orthoses: Knee Immobilizer -  Right;Knee Immobilizer - Left Knee Immobilizer - Right: On at all times Knee Immobilizer - Left: On at all times Restrictions Weight Bearing Restrictions Per Provider Order: Yes LLE Weight Bearing Per Provider Order: Non weight bearing Other Position/Activity Restrictions: ok to weightbear for STP's only     Mobility Bed Mobility Overal bed mobility: Needs Assistance Bed Mobility: Rolling, Sidelying to Sit, Supine to Sit Rolling: Mod assist Sidelying to sit: +2 for physical assistance, Mod assist, Used rails, HOB elevated Supine to sit: Mod assist, +2 for physical assistance, HOB elevated, Used rails     General bed mobility comments: Educated in B KI mobility for EOB sitting    Transfers Overall transfer level: Needs assistance Equipment used: Rolling walker (2 wheels) Transfers: Sit to/from Stand, Bed to chair/wheelchair/BSC Sit to Stand: +2 physical assistance, Max assist, From elevated surface Stand pivot transfers: +2 physical assistance, Max assist        Lateral/Scoot Transfers: +2 physical assistance, Mod assist, From elevated surface General transfer comment: lateral transfer with +2 for LE management for DABSC and + 2 with max A for SPT with RW to recliner      Balance Overall balance assessment: Needs assistance Sitting-balance support: Single extremity supported Sitting balance-Leahy Scale: Fair     Standing balance support: Bilateral upper extremity supported Standing balance-Leahy Scale: Poor Standing balance comment: needs elevated surface and +2 support for balance in standing due to post op WB prec and B knee immobilization  ADL either performed or assessed with clinical judgement   ADL Overall ADL's : Needs assistance/impaired Eating/Feeding: Independent Eating/Feeding Details (indicate cue type and reason): Recliner and bed level Grooming: Wash/dry face;Oral care;Set up Grooming Details (indicate cue type and  reason): Recliner and bed level Upper Body Bathing: Contact guard assist Upper Body Bathing Details (indicate cue type and reason): Recliner and bed level Lower Body Bathing: Maximal assistance Lower Body Bathing Details (indicate cue type and reason): unable to reach below upper LE's due to B KI Upper Body Dressing : Contact guard assist Upper Body Dressing Details (indicate cue type and reason): hospital gown only Lower Body Dressing: Maximal assistance Lower Body Dressing Details (indicate cue type and reason): unable to reach due to B KI Toilet Transfer: +2 for physical assistance;Maximal assistance;Requires drop arm Toilet Transfer Details (indicate cue type and reason): lateral transfer Toileting- Clothing Manipulation and Hygiene: Maximal assistance   Tub/ Shower Transfer: Total assistance   Functional mobility during ADLs: +2 for safety/equipment;+2 for physical assistance;Maximal assistance;Rolling walker (2 wheels) General ADL Comments: UB grooming with set up, hospital gown UB with CGA/min A, LB self care with max A due to unable to reach with B KI, lateral transfer with +2 for LE management for DABSC and + 2 with max A for SPT with RW to recliner     Vision Baseline Vision/History: 1 Wears glasses Ability to See in Adequate Light: 0 Adequate Patient Visual Report: No change from baseline       Perception Perception: Within Functional Limits       Praxis Praxis: WFL       Pertinent Vitals/Pain Pain Assessment Pain Assessment: 0-10 Pain Score: 2  Pain Location: B LE's Pain Descriptors / Indicators: Sore Pain Intervention(s): Limited activity within patient's tolerance, Premedicated before session, Repositioned, Relaxation     Extremity/Trunk Assessment Upper Extremity Assessment Upper Extremity Assessment: Overall WFL for tasks assessed;Right hand dominant   Lower Extremity Assessment Lower Extremity Assessment: Defer to PT evaluation   Cervical / Trunk  Assessment Cervical / Trunk Assessment: Normal   Communication     Cognition Arousal: Alert Behavior During Therapy: WFL for tasks assessed/performed Cognition: No apparent impairments                                                          Home Living Family/patient expects to be discharged to:: Private residence Living Arrangements: Spouse/significant other Available Help at Discharge: Family Type of Home: House Home Access: Stairs to enter Secretary/administrator of Steps: 3 Entrance Stairs-Rails: None Home Layout: Two level Alternate Level Stairs-Number of Steps: garage has 5 steps with rails, can set up on 1st floor Alternate Level Stairs-Rails: Right;Left Bathroom Shower/Tub: Producer, television/film/video: Standard Bathroom Accessibility: Yes How Accessible: Accessible via walker;Accessible via wheelchair Home Equipment: Shower seat;Hand held shower head          Prior Functioning/Environment Prior Level of Function : Independent/Modified Independent;Working/employed;Driving             Mobility Comments: indep without AD ADLs Comments: pt completely indep with all aspects of A/IADL's including pet care, working and driving    OT Problem List: Decreased activity tolerance;Impaired balance (sitting and/or standing);Decreased knowledge of precautions;Decreased knowledge of use of DME or AE;Pain   OT Treatment/Interventions: Self-care/ADL training;DME and/or AE  instruction;Therapeutic activities;Balance training;Energy conservation;Patient/family education      OT Goals(Current goals can be found in the care plan section)   Acute Rehab OT Goals Patient Stated Goal: to go home with his wife with therapy if able OT Goal Formulation: With patient Time For Goal Achievement: 05/15/23 Potential to Achieve Goals: Good   OT Frequency:  Min 2X/week    Co-evaluation              AM-PAC OT "6 Clicks" Daily Activity     Outcome  Measure Help from another person eating meals?: None Help from another person taking care of personal grooming?: A Little Help from another person toileting, which includes using toliet, bedpan, or urinal?: A Lot Help from another person bathing (including washing, rinsing, drying)?: A Lot Help from another person to put on and taking off regular upper body clothing?: A Little Help from another person to put on and taking off regular lower body clothing?: Total 6 Click Score: 15   End of Session Equipment Utilized During Treatment: Gait belt;Rolling walker (2 wheels);Right knee immobilizer;Left knee immobilizer Nurse Communication: Mobility status;Precautions;Weight bearing status  Activity Tolerance: Patient tolerated treatment well;No increased pain Patient left: in chair;with call bell/phone within reach;with chair alarm set  OT Visit Diagnosis: Unsteadiness on feet (R26.81);Other abnormalities of gait and mobility (R26.89);History of falling (Z91.81)                Time: 0454-0981 OT Time Calculation (min): 40 min Charges:  OT General Charges $OT Visit: 1 Visit OT Evaluation $OT Eval Moderate Complexity: 1 Mod OT Treatments $Self Care/Home Management : 8-22 mins Emerie Vanderkolk OT/L Acute Rehabilitation Department  [phone]  05/01/2023, 9:53 AM

## 2023-05-01 NOTE — Progress Notes (Signed)

## 2023-05-01 NOTE — TOC Initial Note (Signed)
 Transition of Care (TOC) - Initial/Assessment Note    Patient Details  Name: Paul [name]. MRN: [id number] Date of Birth: October 22, 1945  Transition [facility]) CM/SW Contact:    Amada Jupiter, LCSW Phone Number: 05/01/2023, 1:42 PM  Clinical Narrative:                  Met with pt today to introduce TOC/ CSW role with dc planning.  Pt very pleasant and engaged as well as motivated for therapy.  He confirms wife in the home, however, does not feel she could meet his current level of assistance.  He is aware that PT has recommended CIR/ AIR and hopeful this can happen.  Explained that CSW will continue follow along for dc needs.  Expected Discharge Plan:  (TBD; possibly CIR) Barriers to Discharge: Continued Medical Work up   Patient Goals and CMS Choice Patient states their goals for this hospitalization and ongoing recovery are:: pt hopeful for CIR rehab and then return home          Expected Discharge Plan and Services In-house Referral: Clinical Social Work     Living arrangements for the past 2 months: Single Family Home                                      Prior Living Arrangements/Services Living arrangements for the past 2 months: Single Family Home Lives with:: Spouse Patient language and need for interpreter reviewed:: Yes Do you feel safe going back to the place where you live?: Yes      Need for Family Participation in Patient Care: Yes (Comment) Care giver support system in place?: Yes (comment)   Criminal Activity/Legal Involvement Pertinent to Current Situation/Hospitalization: No - Comment as needed  Activities of Daily Living   ADL Screening (condition at time of admission) Independently performs ADLs?: No Does the patient have a NEW difficulty with bathing/dressing/toileting/self-feeding that is expected to last >3 days?: Yes (Initiates electronic notice to provider for possible OT consult) Does the patient have a NEW difficulty with getting  in/out of bed, walking, or climbing stairs that is expected to last >3 days?: Yes (Initiates electronic notice to provider for possible PT consult) Does the patient have a NEW difficulty with communication that is expected to last >3 days?: No Is the patient deaf or have difficulty hearing?: No Does the patient have difficulty seeing, even when wearing glasses/contacts?: No Does the patient have difficulty concentrating, remembering, or making decisions?: No  Permission Sought/Granted Permission sought to share information with : Family Supports Permission granted to share information with : Yes, Verbal Permission Granted  Share Information with NAME: wife, Deundre Thong @ [phone]           Emotional Assessment Appearance:: Appears stated age Attitude/Demeanor/Rapport: Gracious, Ambitious, Engaged Affect (typically observed): Accepting Orientation: : Oriented to Self, Oriented to Place, Oriented to  Time, Oriented to Situation Alcohol / Substance Use: Not Applicable Psych Involvement: No (comment)  Admission diagnosis:  Thigh hematoma [S70.10XA] Intractable pain [R52] Traumatic hematoma of knee, left, initial encounter [S80.02XA] Traumatic hematoma of right knee, initial encounter [S80.01XA] Patient Active Problem List   Diagnosis Date Noted   Rupture of bilateral distal quadriceps tendon 04/30/2023   Intractable pain 04/29/2023   Thigh hematoma 04/28/2023   Hemarthrosis of right knee 04/28/2023   Memory problem 04/16/2023   Upper airway cough syndrome 02/28/2023   Tinnitus  12/17/2022   COPD, mild (HCC) 10/25/2022   Asymmetrical thyroid 10/25/2022   Labral tear of shoulder, left, initial encounter 05/30/2022   CAP (community acquired pneumonia) 02/27/2022   Hoarse voice quality 01/08/2022   Family history of skin cancer 06/23/2021   History of malignant neoplasm of skin 06/23/2021   Melanocytic nevi of trunk 06/23/2021   Other seborrheic keratosis 06/23/2021    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   Hypogonadism in male 09/06/2020   Coronary atherosclerosis 05/30/2020   Hearing loss 05/30/2020   Onychomycosis 05/30/2020   Near syncope 02/24/2019   Gross hematuria 12/10/2018   Constipation 12/10/2018   Intertrigo 11/19/2018   Cervical radiculopathy 10/03/2017   Bursitis of left shoulder 10/03/2017   Trigger point of left shoulder region 08/16/2017   Periscapular pain 08/06/2017   Reactive airway disease 06/08/2017   Rotator cuff tear 12/05/2016   Easy bruising 11/09/2016   Subscapular bursitis 10/12/2016   Supraspinatus tendinitis, right 10/12/2016   Shoulder pain, acute 10/11/2016   Patellofemoral arthralgia of left knee 10/11/2016   Rash and nonspecific skin eruption 04/06/2016   Inguinal hernia 10/10/2015   Asthmatic bronchitis 03/18/2014   GERD (gastroesophageal reflux disease) 02/11/2014   Dysphagia 02/11/2014   Cough 02/11/2014   Gum abscess 10/02/2013   Neck pain 07/03/2013   Hypertension 01/22/2012   TMJ arthralgia 04/17/2011   Well adult exam 01/08/2011   Chronic low back pain 10/16/2010   Kidney cyst, acquired 09/11/2010   URINARY CALCULUS 05/05/2010   ABSCESS, PERIRECTAL 04/07/2010   EPISTAXIS 04/07/2010   Headache 02/24/2010   PARESTHESIA 02/24/2010   INSOMNIA, CHRONIC 11/10/2009   SINUSITIS- ACUTE-NOS 10/21/2009   Allergic rhinitis 01/07/2009   ELBOW PAIN 01/07/2009   Anxiety disorder 12/16/2007   Depression 12/16/2007   MYALGIA 12/16/2007   CARPAL TUNNEL SYNDROME 01/31/2007   HEPATITIS C CARRIER 01/31/2007   HEPATITIS C 07/31/2006   Dyslipidemia 07/31/2006   Attention deficit disorder 07/31/2006   BENIGN PROSTATIC HYPERTROPHY, HX OF 07/31/2006   TONSILLECTOMY AND ADENOIDECTOMY, HX OF 07/31/2006   UMBILICAL HERNIORRHAPHY, HX OF 07/31/2006   PCP:  Tresa Garter, MD Pharmacy:   [facility] - [place], [place] - [address] [place] [address] Phone: [phone] Fax: [phone]  [id number] [place], [place] - [address] AT [facility] [address] & [facility] [address] [place] [address] Phone: [phone] Fax: [phone]     Social Drivers of Health (SDOH) Social History: SDOH Screenings   Food Insecurity: No Food Insecurity (04/29/2023)  Housing: Low Risk  (04/29/2023)  Transportation Needs: No Transportation Needs (04/29/2023)  Utilities: Not At Risk (04/29/2023)  Depression (PHQ2-9): Low Risk  (04/16/2023)  Social Connections: Socially Integrated (04/29/2023)  Tobacco Use: Medium Risk (04/29/2023)   SDOH Interventions:     Readmission Risk Interventions    05/01/2023    1:40 PM  Readmission Risk Prevention Plan  Transportation Screening Complete  PCP or Specialist Appt within 5-7 Days Complete  Home Care Screening Complete  Medication Review (RN CM) Complete
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
## 2023-05-01 NOTE — Progress Notes (Signed)
 PROGRESS NOTE Paul [name].  ZOX:[id number] DOB: 1945-11-23 DOA: 04/28/2023 PCP: Tresa Garter, MD  Brief Narrative/Hospital Course: 46 yom w/ Hx of  HTN HLD , COPD, left hamstring injury presented to hospital with severe right leg swelling after mechanical fall while walking the dog. In the ED: Initially slightly low blood pressure labs showed leukocytosis at 19.0.  CK slightly elevated at 702. CT scan of the femur on the right  > showed large volume anterior knee subcutaneous soft tissue hematom Placed on IV fluids pain management orthopedic consulted and admitted MRI of bilateral knee area-has bilateral quad tendon rupture and other findings and subsequently underwent quadricep tendon repair 04/30/23    Subjective: Patient seen and examined this morning Overnight afebrile BP Fairly stable on room air Labs shows WBC slightly up CK at 1553, electrolytes stable Reports he feels fine pain is controlled  Assessment and Plan: Principal Problem:   Rupture of bilateral distal quadriceps tendon Active Problems:   Dyslipidemia   Hypertension   COPD, mild (HCC)   Thigh hematoma   Hemarthrosis of right knee   Intractable pain  Bilateral quadricep tendon rupture 2/2 fall: MRI b/l knees bilateral quad tendon rupture w/ knee DJD, knee joint effusion 2/2 fall. underwent quadricep tendon repair 04/30/23. CK slightly elevated, appreciate orthopedic input continue pain management with scheduled Tylenol, p.o./IV opiates PRN and PT OT and further plan as per orthopedics  Mild leukocytosis: Resolved.  Monitor  Hypertension: BP stable on Diovan  HLD: Hold off statins and tricor 2/2 mild rhabdomyolysis  Chronic diastolic heart failure: Last echo with G1 DD EF 65 to 70%, monitor volume status  Mild traumatic rhabdomyolysis: Monitor CK level will continue gentle IV fluids Recent Labs  Lab 04/28/23 1753 04/29/23 0807 04/30/23 0544 05/01/23 0324  CKTOTAL 702* 1,098* 1,202* 1,553*     COPD: Not in exacerbation followed by pulmonary outpatient continue inhaler as needed  DVT prophylaxis: enoxaparin (LOVENOX) injection 40 mg Start: 05/01/23 0800 SCDs Start: 04/30/23 1804 Code Status:   Code Status: Full Code Family Communication: plan of care discussed with patient at bedside. Patient status is: Remains hospitalized because of severity of illness Level of care: Telemetry   Dispo: The patient is from: Home            Anticipated disposition: TBD will plan CIR on discharge Objective: Vitals last 24 hrs: Vitals:   04/30/23 2125 05/01/23 0133 05/01/23 0651 05/01/23 0932  BP: 124/63 (!) 104/55 125/68 122/61  Pulse: 100 87 85 89  Resp: 16 16 16 17   Temp: 98 F (36.7 C) 98.1 F (36.7 C) 97.8 F (36.6 C) 97.6 F (36.4 C)  TempSrc: Oral Oral Oral   SpO2: 94% 94% 94% 96%  Weight:      Height:       Weight change:   Physical Examination: General exam: alert awake, oriented at baseline, older than stated age HEENT:Oral mucosa moist, Ear/Nose WNL grossly Respiratory system: Bilaterally clear BS,no use of accessory muscle Cardiovascular system: S1 & S2 +, No JVD. Gastrointestinal system: Abdomen soft,NT,ND, BS+ Nervous System: Alert, awake, moving all extremities,and following commands. Extremities: Bilateral knee area with respiratory place dressing intact did not remove distal extremities perfused Skin: No rashes,no icterus. MSK: Normal muscle bulk,tone, power   Medications reviewed:  Scheduled Meds:  acetaminophen  1,000 mg Oral Q6H   docusate sodium  100 mg Oral BID   enoxaparin (LOVENOX) injection  40 mg Subcutaneous Q24H   finasteride  5 mg Oral Daily  irbesartan  150 mg Oral Daily   mometasone-formoterol  2 puff Inhalation BID   pantoprazole  40 mg Oral Daily   Continuous Infusions:  sodium chloride 100 mL/hr at 05/01/23 0307      Diet Order             Diet Heart Room service appropriate? Yes; Fluid consistency: Thin  Diet effective now                    Intake/Output Summary (Last 24 hours) at 05/01/2023 1138 Last data filed at 05/01/2023 1024 Gross per 24 hour  Intake 4105.97 ml  Output 1000 ml  Net 3105.97 ml   Net IO Since Admission: 2,755.97 mL [05/01/23 1138]  Wt Readings from Last 3 Encounters:  04/28/23 90.7 kg  04/16/23 91.6 kg  01/16/23 91.2 kg     Unresulted Labs (From admission, onward)     Start     Ordered   05/07/23 0500  Creatinine, serum  (enoxaparin (LOVENOX)    CrCl >/= 30 ml/min)  Weekly,   R     Comments: while on enoxaparin therapy    04/30/23 1803   05/01/23 0500  Basic metabolic panel  Daily,   R      04/30/23 0903   05/01/23 0500  CBC  Daily,   R      04/30/23 0903          Data Reviewed: I have personally reviewed following labs and imaging studies CBC: Recent Labs  Lab 04/28/23 1753 04/29/23 1053 04/30/23 0544 04/30/23 1834 05/01/23 0324  WBC 19.0* 8.3 11.2* 13.2* 14.4*  NEUTROABS 16.4*  --   --   --   --   HGB 16.7 15.6 15.1 14.6 14.0  HCT 49.3 47.9 46.0 44.1 42.2  MCV 93.0 96.2 95.6 95.7 96.1  PLT 234 222 208 197 214   Basic Metabolic Panel:  Recent Labs  Lab 04/28/23 1753 04/29/23 0807 04/30/23 0544 04/30/23 1834 05/01/23 0324  NA 137 137 132*  --  134*  K 3.5 3.9 3.7  --  3.8  CL 103 104 101  --  103  CO2 24 24 24   --  23  GLUCOSE 106* 109* 108*  --  144*  BUN 16 15 18   --  24*  CREATININE 0.85 0.74 0.89 1.12 1.04  CALCIUM 8.9 8.2* 8.0*  --  7.9*  MG  --  1.8 1.7  --   --   PHOS  --  2.4*  --   --   --    GFR: Estimated Creatinine Clearance: 64.3 mL/min (by C-G formula based on SCr of 1.04 mg/dL). Liver Function Tests:  Recent Labs  Lab 04/28/23 1753 04/29/23 0807  AST 53* 55*  ALT 27 25  ALKPHOS 38 37*  BILITOT 0.8 1.3*  PROT 6.3* 5.8*  ALBUMIN 3.7 3.5  Sepsis Labs: No results for input(s): "PROCALCITON", "LATICACIDVEN" in the last 168 hours. No results found for this or any previous visit (from the past 240 hours).   Antimicrobials/Microbiology: Anti-infectives (From admission, onward)    Start     Dose/Rate Route Frequency Ordered Stop   04/30/23 2000  ceFAZolin (ANCEF) IVPB 2g/100 mL premix        2 g 200 mL/hr over 30 Minutes Intravenous Every 6 hours 04/30/23 1803 05/01/23 0232   04/30/23 1300  ceFAZolin (ANCEF) IVPB 2g/100 mL premix        2 g 200 mL/hr over 30 Minutes Intravenous  On call to O.R. 04/29/23 1228 04/30/23 1334      No results found for: "SDES", "SPECREQUEST", "CULT", "REPTSTATUS"   Radiology Studies: No results found.    LOS: 2 days   Total time spent in review of labs and imaging, patient evaluation, formulation of plan, documentation and communication with family: 35 minutes  Lanae Boast, MD  [facility]  05/01/2023, 11:38 AM

## 2023-05-01 NOTE — Progress Notes (Signed)
  Inpatient Rehabilitation Admissions Coordinator   I spoke with patient by phone for rehab assessment. We discussed goals and expectations of a possible CIR admit. Noted WB restrictions of pivot only so limited mobility goals.They prefer CIR for rehab. Wife retired and can assist at home. I will discuss with Dr Riley Kill for clarification of candidacy for CIR admit and begin insurance Auth with [employer]. I will follow up in the am.. Please call me with any questions.   Ottie Glazier, RN, MSN Rehab Admissions Coordinator [phone]

## 2023-05-01 NOTE — Progress Notes (Signed)
 Subjective: Patient reports pain as mild.  Tolerating diet.  Urinating.   No CP, SOB.  Has not mobilized much OOB with PT/OT yet. Eager to progress.  Objective:   VITALS:   Vitals:   04/30/23 2111 04/30/23 2125 05/01/23 0133 05/01/23 0651  BP:  124/63 (!) 104/55 125/68  Pulse:  100 87 85  Resp:  16 16 16   Temp:  98 F (36.7 C) 98.1 F (36.7 C) 97.8 F (36.6 C)  TempSrc:  Oral Oral Oral  SpO2: 94% 94% 94% 94%  Weight:      Height:          Latest Ref Rng & Units 05/01/2023    3:24 AM 04/30/2023    6:34 PM 04/30/2023    5:44 AM  CBC  WBC 4.0 - 10.5 K/uL 14.4  13.2  11.2   Hemoglobin 13.0 - 17.0 g/dL 81.1  91.4  78.2   Hematocrit 39.0 - 52.0 % 42.2  44.1  46.0   Platelets 150 - 400 K/uL 214  197  208       Latest Ref Rng & Units 05/01/2023    3:24 AM 04/30/2023    6:34 PM 04/30/2023    5:44 AM  BMP  Glucose 70 - 99 mg/dL 956   213   BUN 8 - 23 mg/dL 24   18   Creatinine 0.86 - 1.24 mg/dL 5.78  4.69  6.29   Sodium 135 - 145 mmol/L 134   132   Potassium 3.5 - 5.1 mmol/L 3.8   3.7   Chloride 98 - 111 mmol/L 103   101   CO2 22 - 32 mmol/L 23   24   Calcium 8.9 - 10.3 mg/dL 7.9   8.0    Intake/Output      02/18 0701 02/19 0700 02/19 0701 02/20 0700   P.O. 600    I.V. (mL/kg) 2629 (29)    IV Piggyback 197    Total Intake(mL/kg) 3426 (37.8)    Urine (mL/kg/hr) 900 (0.4)    Blood 100    Total Output 1000    Net +2426            Physical Exam: General: NAD.  Sitting up in bedside chair, calm, eating breakfast Resp: No increased wob Cardio: regular rate and rhythm ABD soft Neurologically intact MSK Neurovascularly intact Sensation intact distally Intact pulses distally Dorsiflexion/Plantar flexion intact Incision: dressing C/D/I KI in place to B/L legs  Assessment: 1 Day Post-Op  S/P Procedure(s) (LRB): BILATERAL QUADRICEP TENDON REPAIR (Bilateral) by Dr. Jewel Baize. Eulah Pont on 04/30/23  Principal Problem:   Rupture of bilateral distal quadriceps  tendon Active Problems:   Dyslipidemia   Hypertension   COPD, mild (HCC)   Thigh hematoma   Hemarthrosis of right knee   Intractable pain   Plan:  Advance diet Up with therapy as able Incentive Spirometry Elevate and Apply ice  Weightbearing: WBAT RLE and LLE in KI for pivot transfers and small steps with walker, essentially "peg leg" movement, no knee ROM Insicional and dressing care: Dressings left intact until follow-up and Reinforce dressings as needed Orthopedic device(s):  KI and will switch to Bledsoe braces at 1st postop or sooner is needed Showering: Keep dressing dry VTE prophylaxis: Lovenox 40mg  qd  while inpatient, can switch to Eliquis or Xarelto for 30 days postop upon d/c , SCDs, ambulation Pain control: PRN Follow - up plan:  10-14 days postop Contact information:  Margarita Rana MD, Lindie Spruce  Mackena Plummer PA-C  Dispo:  TBD based on pain control and PT/OT evaluations.     Jenne Pane, PA-C Office [phone] 05/01/2023, 9:08 AM

## 2023-05-01 NOTE — Evaluation (Signed)
 Physical Therapy Evaluation Patient Details Name: Paul [name]. MRN: [id number] DOB: April 18, 1945 Today's Date: 05/01/2023  History of Present Illness  Pt is a 78 y.o. male who fell onto B knees while side stepping on wet grass walking his dog and hyperflexed B knees sustaining a B quad tendon tear and large hematoma. S/p operative repair and is now WBAT in immobilizers for transfers only otherwise NWB PMH: HTN HLD, COPD, CHF  Clinical Impression  Pt admitted with above diagnosis. +2 max assist sit to stand (40%) from recliner, pt then able to take a few pivotal steps to bed with RW. Assist to bring LEs into bed. Pt puts forth good effort. He is a good candidate for AIR. Pt plans to rent a WC ramp for entry into home. He will need to be at a +1 assist level in order to DC home. Pt currently with functional limitations due to the deficits listed below (see PT Problem List). Pt will benefit from acute skilled PT to increase their independence and safety with mobility to allow discharge.           If plan is discharge home, recommend the following: Two people to help with walking and/or transfers;A lot of help with bathing/dressing/bathroom;Assistance with cooking/housework;Assist for transportation;Help with stairs or ramp for entrance   Can travel by private vehicle        Equipment Recommendations Wheelchair (measurements PT);Wheelchair cushion (measurements PT);BSC/3in1;Rolling walker (2 wheels);Other (comment) (WC with elevating leg rests)  Recommendations for Other Services  Rehab consult    Functional Status Assessment Patient has had a recent decline in their functional status and demonstrates the ability to make significant improvements in function in a reasonable and predictable amount of time.     Precautions / Restrictions Precautions Precautions: Knee Precaution/Restrictions Comments: B knee immobilizers Required Braces or Orthoses: Knee Immobilizer - Right;Knee  Immobilizer - Left Knee Immobilizer - Right: On at all times Knee Immobilizer - Left: On at all times Restrictions Weight Bearing Restrictions Per Provider Order: Yes RLE Weight Bearing Per Provider Order: Non weight bearing LLE Weight Bearing Per Provider Order: Non weight bearing Other Position/Activity Restrictions: ok to weightbear for STP's only      Mobility  Bed Mobility Overal bed mobility: Needs Assistance Bed Mobility: Sit to Supine       Sit to supine: Mod assist   General bed mobility comments: mod A for BLEs into bed    Transfers Overall transfer level: Needs assistance Equipment used: Rolling walker (2 wheels) Transfers: Sit to/from Stand, Bed to chair/wheelchair/BSC Sit to Stand: +2 physical assistance, Max assist, From elevated surface   Step pivot transfers: +2 safety/equipment, Min assist       General transfer comment: stood from recliner with +2 assist to power up, pt able to then walk feet back underneath him and take a few pivotal steps to the recliner with RW    Ambulation/Gait                  Stairs            Wheelchair Mobility     Tilt Bed    Modified Rankin (Stroke Patients Only)       Balance Overall balance assessment: Needs assistance Sitting-balance support: Feet supported, No upper extremity supported Sitting balance-Leahy Scale: Fair     Standing balance support: Bilateral upper extremity supported, Reliant on assistive device for balance, During functional activity Standing balance-Leahy Scale: Poor Standing balance comment: reliant upon BUE  support                             Pertinent Vitals/Pain Pain Assessment Pain Score: 5  Pain Location: B LEs Pain Descriptors / Indicators: Dull Pain Intervention(s): Limited activity within patient's tolerance, Monitored during session, Premedicated before session, Repositioned    Home Living Family/patient expects to be discharged to:: Private  residence Living Arrangements: Spouse/significant other Available Help at Discharge: Family Type of Home: House Home Access: Stairs to enter Entrance Stairs-Rails: None Entrance Stairs-Number of Steps: 3 Alternate Level Stairs-Number of Steps: garage has 5 steps with rails, can set up on 1st floor Home Layout: Two level Home Equipment: Shower seat;Hand held shower head      Prior Function Prior Level of Function : Independent/Modified Independent             Mobility Comments: indep without AD ADLs Comments: pt completely indep with all aspects of A/IADL's including pet care, working and driving     Extremity/Trunk Assessment   Upper Extremity Assessment Upper Extremity Assessment: Overall WFL for tasks assessed    Lower Extremity Assessment Lower Extremity Assessment: RLE deficits/detail;LLE deficits/detail RLE Deficits / Details: SLR -3/5 RLE: Unable to fully assess due to immobilization RLE Sensation: WNL LLE Deficits / Details: SLR -3/5 LLE: Unable to fully assess due to immobilization LLE Sensation: WNL    Cervical / Trunk Assessment Cervical / Trunk Assessment: Normal  Communication   Communication Communication: No apparent difficulties    Cognition Arousal: Alert Behavior During Therapy: WFL for tasks assessed/performed   PT - Cognitive impairments: No apparent impairments                         Following commands: Intact       Cueing       General Comments General comments (skin integrity, edema, etc.): BKIs donned throughout session    Exercises General Exercises - Lower Extremity Ankle Circles/Pumps: AROM, Both, 10 reps, Supine   Assessment/Plan    PT Assessment Patient needs continued PT services  PT Problem List Decreased activity tolerance;Decreased strength;Decreased mobility;Pain;Decreased knowledge of use of DME;Decreased range of motion       PT Treatment Interventions Gait training;Therapeutic activities;Therapeutic  exercise;Balance training;Patient/family education;Wheelchair mobility training    PT Goals (Current goals can be found in the Care Plan section)  Acute Rehab PT Goals 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)) PT Goal Formulation: With patient Time For Goal Achievement: 05/15/23 Potential to Achieve Goals: Good    Frequency Min 1X/week     Co-evaluation               AM-PAC PT "6 Clicks" Mobility  Outcome Measure Help needed turning from your back to your side while in a flat bed without using bedrails?: A Little Help needed moving from lying on your back to sitting on the side of a flat bed without using bedrails?: A Lot Help needed moving to and from a bed to a chair (including a wheelchair)?: A Lot Help needed standing up from a chair using your arms (e.g., wheelchair or bedside chair)?: Total Help needed to walk in hospital room?: Total Help needed climbing 3-5 steps with a railing? : Total 6 Click Score: 10    End of Session Equipment Utilized During Treatment: Gait belt Activity Tolerance: Patient tolerated treatment well Patient left: in bed;with bed alarm set;with nursing/sitter in room;with family/visitor present Nurse  Communication: Mobility status PT Visit Diagnosis: Difficulty in walking, not elsewhere classified (R26.2);Pain;Muscle weakness (generalized) (M62.81);Other abnormalities of gait and mobility (R26.89) Pain - Right/Left:  (both) Pain - part of body: Knee    Time: 1610-9604 PT Time Calculation (min) (ACUTE ONLY): 29 min   Charges:   PT Evaluation $PT Eval Moderate Complexity: 1 Mod PT Treatments $Therapeutic Activity: 8-22 mins PT General Charges $$ ACUTE PT VISIT: 1 Visit        Tamala Ser PT 05/01/2023  Acute Rehabilitation Services  Office [phone]

## 2023-05-02 ENCOUNTER — Encounter (HOSPITAL_COMMUNITY): Payer: Self-pay | Admitting: Orthopedic Surgery

## 2023-05-02 DIAGNOSIS — S76119A Strain of unspecified quadriceps muscle, fascia and tendon, initial encounter: Secondary | ICD-10-CM | POA: Diagnosis not present

## 2023-05-02 LAB — CBC
HCT: 39.5 % (ref 39.0–52.0)
Hemoglobin: 13.1 g/dL (ref 13.0–17.0)
MCH: 32 pg (ref 26.0–34.0)
MCHC: 33.2 g/dL (ref 30.0–36.0)
MCV: 96.3 fL (ref 80.0–100.0)
Platelets: 226 K/uL (ref 150–400)
RBC: 4.1 MIL/uL — ABNORMAL LOW (ref 4.22–5.81)
RDW: 12.8 % (ref 11.5–15.5)
WBC: 16.2 K/uL — ABNORMAL HIGH (ref 4.0–10.5)
nRBC: 0 % (ref 0.0–0.2)

## 2023-05-02 LAB — BASIC METABOLIC PANEL WITH GFR
Anion gap: 8 (ref 5–15)
BUN: 18 mg/dL (ref 8–23)
CO2: 24 mmol/L (ref 22–32)
Calcium: 7.9 mg/dL — ABNORMAL LOW (ref 8.9–10.3)
Chloride: 104 mmol/L (ref 98–111)
Creatinine, Ser: 0.9 mg/dL (ref 0.61–1.24)
GFR, Estimated: >60 mL/min
Glucose, Bld: 101 mg/dL — ABNORMAL HIGH (ref 70–99)
Potassium: 3.9 mmol/L (ref 3.5–5.1)
Sodium: 136 mmol/L (ref 135–145)

## 2023-05-02 LAB — CK: Total CK: 1545 U/L — ABNORMAL HIGH (ref 49–397)

## 2023-05-02 MED ORDER — HYDROCORTISONE 0.5 % EX CREA
TOPICAL_CREAM | Freq: Three times a day (TID) | CUTANEOUS | Status: DC | PRN
  Filled 2023-05-02: qty 28.35

## 2023-05-02 NOTE — Progress Notes (Signed)
 Inpatient Rehabilitation Admissions Coordinator   I await insurance approval for possible Cir admit.  Ottie Glazier, RN, MSN Rehab Admissions Coordinator [phone] 05/02/2023 10:52 AM

## 2023-05-02 NOTE — Progress Notes (Signed)
 Inpatient Rehabilitation Admissions Coordinator   I have insurance approval and can admit him to CIR tomorrow. I spoke with patient by phone and he is aware and in agreement. Acute team and TOC made aware. I will follow up in the am to make the arrangements.  Ottie Glazier, RN, MSN Rehab Admissions Coordinator [phone] 05/02/2023 3:50 PM

## 2023-05-02 NOTE — Progress Notes (Signed)
 PROGRESS NOTE Paul [name].  [name]:[id number] DOB: 12/09/1945 DOA: 04/28/2023 PCP: Tresa Garter, MD  Brief Narrative/Hospital Course: 65 yom w/ Hx of  HTN HLD , COPD, left hamstring injury presented to hospital with severe right leg swelling after mechanical fall while walking the dog.In the ED: Initially slightly low blood pressure labs showed leukocytosis at 19.0.  CK slightly elevated at 702. CT scan of the femur on the right  > showed large volume anterior knee subcutaneous soft tissue hematom Placed on IV fluids pain management orthopedic consulted and admitted MRI of bilateral knee area-has bilateral quad tendon rupture and other findings and subsequently underwent quadricep tendon repair 04/30/23 Orthopedics following advised WBAT RLE and LLE in KI for people transfer and small steps with walker essentially "tingling" movement no knee ROM.  PT OT following and recommending CIR.  DVT prophylaxis with Lovenox while inpatient switch to Eliquis or Xarelto x 30 days discharge and follow-up with orthopedics in 10 to 14 days postop.   Subjective: Patient seen examined this morning  Complains of soreness in his legs more on the left knee  area No fever no chills, WBC trending up   Assessment and Plan: Principal Problem:   Rupture of bilateral distal quadriceps tendon Active Problems:   Dyslipidemia   Hypertension   COPD, mild (HCC)   Thigh hematoma   Hemarthrosis of right knee   Intractable pain  Bilateral quadricep tendon rupture 2/2 fall: MRI b/l knees bilateral quad tendon rupture w/ knee DJD, knee joint effusion 2/2 fall. underwent quadricep tendon repair 04/30/23. CK slightly elevated and has leukocytosis likely postop, no fever.  Trend CBC CMP and keep on IV fluid hydration Orthopedics advised WBAT RLE and LLE in KI for people transfer and small steps with walker essentially "tingling" movement no knee ROM.  PT OT following and recommending CIR.  DVT prophylaxis with  Lovenox while inpatient switch to Eliquis or Xarelto x 30 days discharge and follow-up with orthopedics in 10 to 14 days postopappreciate orthopedic input continue pain management with scheduled Tylenol, p.o./IV opiates PRN and PT OT and further plan as per orthopedics  Mild leukocytosis: Likely reactive, postop possibly from muscle tear.  Monitor Recent Labs  Lab 04/29/23 1053 04/30/23 0544 04/30/23 1834 05/01/23 0324 05/02/23 0322  WBC 8.3 11.2* 13.2* 14.4* 16.2*    Hypertension: BP stable on Diovan  HLD: Hold off statins and tricor 2/2 mild rhabdomyolysis  Chronic diastolic heart failure: Last echo with G1 DD EF 65 to 70%, monitor volume status  Mild traumatic rhabdomyolysis: Monitor CK level will continue gentle IV fluids Recent Labs  Lab 04/28/23 1753 04/29/23 0807 04/30/23 0544 05/01/23 0324 05/02/23 0322  CKTOTAL 702* 1,098* 1,202* 1,553* 1,545*    COPD: Not in exacerbation followed by pulmonary outpatient continue inhaler as needed  DVT prophylaxis: enoxaparin (LOVENOX) injection 40 mg Start: 05/01/23 0800 SCDs Start: 04/30/23 1804 Code Status:   Code Status: Full Code Family Communication: plan of care discussed with patient at bedside. Patient status is: Remains hospitalized because of severity of illness Level of care: Telemetry   Dispo: The patient is from: Home            Anticipated disposition: Planning for CIR admission likely tomorrow if insurance approved  Objective: Vitals last 24 hrs: Vitals:   05/01/23 1321 05/01/23 2117 05/01/23 2118 05/02/23 0443  BP: 117/61  128/68 (!) 154/73  Pulse: 76  79 76  Resp: 17  18 18   Temp: (!) 97.5 F (36.4  C)  97.8 F (36.6 C) (!) 97.4 F (36.3 C)  TempSrc:   Oral Oral  SpO2: 97% 94% 96% 98%  Weight:      Height:       Weight change:   Physical Examination: General exam: alert awake, oriented at baseline, older than stated age HEENT:Oral mucosa moist, Ear/Nose WNL grossly Respiratory system:  Bilaterally clear BS,no use of accessory muscle Cardiovascular system: S1 & S2 +, No JVD. Gastrointestinal system: Abdomen soft,NT,ND, BS+ Nervous System: Alert, awake, moving all extremities,and following commands. Extremities: LE edema neg, bilateral knee area with dressing and splint in place did not remove  Skin: No rashes,no icterus. MSK: Normal muscle bulk,tone, power   Medications reviewed:  Scheduled Meds:  docusate sodium  100 mg Oral BID   enoxaparin (LOVENOX) injection  40 mg Subcutaneous Q24H   finasteride  5 mg Oral Daily   irbesartan  150 mg Oral Daily   mometasone-formoterol  2 puff Inhalation BID   pantoprazole  40 mg Oral Daily   Continuous Infusions:  sodium chloride 125 mL/hr at 05/02/23 4098      Diet Order             Diet Heart Room service appropriate? Yes; Fluid consistency: Thin  Diet effective now                   Intake/Output Summary (Last 24 hours) at 05/02/2023 1138 Last data filed at 05/02/2023 1010 Gross per 24 hour  Intake 2304 ml  Output 1690 ml  Net 614 ml   Net IO Since Admission: 3,369.97 mL [05/02/23 1138]  Wt Readings from Last 3 Encounters:  04/28/23 90.7 kg  04/16/23 91.6 kg  01/16/23 91.2 kg     Unresulted Labs (From admission, onward)     Start     Ordered   05/07/23 0500  Creatinine, serum  (enoxaparin (LOVENOX)    CrCl >/= 30 ml/min)  Weekly,   R     Comments: while on enoxaparin therapy    04/30/23 1803   05/02/23 0500  CK  Daily,   R      05/01/23 1138   05/01/23 0500  Basic metabolic panel  Daily,   R      04/30/23 0903   05/01/23 0500  CBC  Daily,   R      04/30/23 0903          Data Reviewed: I have personally reviewed following labs and imaging studies CBC: Recent Labs  Lab 04/28/23 1753 04/29/23 1053 04/30/23 0544 04/30/23 1834 05/01/23 0324 05/02/23 0322  WBC 19.0* 8.3 11.2* 13.2* 14.4* 16.2*  NEUTROABS 16.4*  --   --   --   --   --   HGB 16.7 15.6 15.1 14.6 14.0 13.1  HCT 49.3 47.9 46.0  44.1 42.2 39.5  MCV 93.0 96.2 95.6 95.7 96.1 96.3  PLT 234 222 208 197 214 226   Basic Metabolic Panel:  Recent Labs  Lab 04/28/23 1753 04/29/23 0807 04/30/23 0544 04/30/23 1834 05/01/23 0324 05/02/23 0322  NA 137 137 132*  --  134* 136  K 3.5 3.9 3.7  --  3.8 3.9  CL 103 104 101  --  103 104  CO2 24 24 24   --  23 24  GLUCOSE 106* 109* 108*  --  144* 101*  BUN 16 15 18   --  24* 18  CREATININE 0.85 0.74 0.89 1.12 1.04 0.90  CALCIUM 8.9 8.2* 8.0*  --  7.9* 7.9*  MG  --  1.8 1.7  --   --   --   PHOS  --  2.4*  --   --   --   --    GFR: Estimated Creatinine Clearance: 74.2 mL/min (by C-G formula based on SCr of 0.9 mg/dL). Liver Function Tests:  Recent Labs  Lab 04/28/23 1753 04/29/23 0807  AST 53* 55*  ALT 27 25  ALKPHOS 38 37*  BILITOT 0.8 1.3*  PROT 6.3* 5.8*  ALBUMIN 3.7 3.5  Sepsis Labs: No results for input(s): "PROCALCITON", "LATICACIDVEN" in the last 168 hours. No results found for this or any previous visit (from the past 240 hours).  Antimicrobials/Microbiology: Anti-infectives (From admission, onward)    Start     Dose/Rate Route Frequency Ordered Stop   04/30/23 2000  ceFAZolin (ANCEF) IVPB 2g/100 mL premix        2 g 200 mL/hr over 30 Minutes Intravenous Every 6 hours 04/30/23 1803 05/01/23 0232   04/30/23 1300  ceFAZolin (ANCEF) IVPB 2g/100 mL premix        2 g 200 mL/hr over 30 Minutes Intravenous On call to O.R. 04/29/23 1228 04/30/23 1334      No results found for: "SDES", "SPECREQUEST", "CULT", "REPTSTATUS"   Radiology Studies: No results found.   LOS: 3 days   Total time spent in review of labs and imaging, patient evaluation, formulation of plan, documentation and communication with family: 35 minutes  Lanae Boast, MD  [facility]  05/02/2023, 11:38 AM

## 2023-05-02 NOTE — Progress Notes (Signed)
 Subjective: Patient reports pain as increased now that the nerve blocks have worn off. The PRN pain medicines do help though.  Tolerating diet.  Urinating.   No CP, SOB.  Has mobilized OOB with PT/OT. Eager to progress.  Objective:   VITALS:   Vitals:   05/01/23 1321 05/01/23 2117 05/01/23 2118 05/02/23 0443  BP: 117/61  128/68 (!) 154/73  Pulse: 76  79 76  Resp: 17  18 18   Temp: (!) 97.5 F (36.4 C)  97.8 F (36.6 C) (!) 97.4 F (36.3 C)  TempSrc:   Oral Oral  SpO2: 97% 94% 96% 98%  Weight:      Height:          Latest Ref Rng & Units 05/02/2023    3:22 AM 05/01/2023    3:24 AM 04/30/2023    6:34 PM  CBC  WBC 4.0 - 10.5 K/uL 16.2  14.4  13.2   Hemoglobin 13.0 - 17.0 g/dL 40.9  81.1  91.4   Hematocrit 39.0 - 52.0 % 39.5  42.2  44.1   Platelets 150 - 400 K/uL 226  214  197       Latest Ref Rng & Units 05/02/2023    3:22 AM 05/01/2023    3:24 AM 04/30/2023    6:34 PM  BMP  Glucose 70 - 99 mg/dL 782  956    BUN 8 - 23 mg/dL 18  24    Creatinine 2.13 - 1.24 mg/dL 0.86  5.78  4.69   Sodium 135 - 145 mmol/L 136  134    Potassium 3.5 - 5.1 mmol/L 3.9  3.8    Chloride 98 - 111 mmol/L 104  103    CO2 22 - 32 mmol/L 24  23    Calcium 8.9 - 10.3 mg/dL 7.9  7.9     Intake/Output      02/19 0701 02/20 0700 02/20 0701 02/21 0700   P.O. 960    I.V. (mL/kg) 1717.3 (18.9)    IV Piggyback     Total Intake(mL/kg) 2677.3 (29.5)    Urine (mL/kg/hr) 740 (0.3)    Blood     Total Output 740    Net +1937.3            Physical Exam: General: NAD.  Sitting up in bedside chair, calm, comfortable  Resp: No increased wob Cardio: regular rate and rhythm ABD soft Neurologically intact MSK Neurovascularly intact Sensation intact distally Intact pulses distally Dorsiflexion/Plantar flexion intact Incision: dressing C/D/I KI in place to B/L legs  Assessment: 2 Days Post-Op  S/P Procedure(s) (LRB): BILATERAL QUADRICEP TENDON REPAIR (Bilateral) by Dr. Jewel Baize. Eulah Pont on  04/30/23  Principal Problem:   Rupture of bilateral distal quadriceps tendon Active Problems:   Dyslipidemia   Hypertension   COPD, mild (HCC)   Thigh hematoma   Hemarthrosis of right knee   Intractable pain   Plan:  Advance diet Up with therapy as able Incentive Spirometry Elevate and Apply ice  CK continued to increase since first drawn in ER. Currently stable ~1550 Likely due to the massive traumatic injuries to the knees that resulted in ruptures of the quad tendons and large hematomas. Kidney function appears normal. Recommend continued hydration IV and PO.   Weightbearing: WBAT RLE and LLE in KI for pivot transfers and small steps with walker, essentially "peg leg" movement, no knee ROM Insicional and dressing care: Dressings left intact until follow-up and Reinforce dressings as needed Orthopedic device(s):  KI and will switch to Bledsoe braces at 1st postop or sooner if needed Showering: Keep dressing dry VTE prophylaxis: Lovenox 40mg  qd  while inpatient, can switch to Eliquis or Xarelto for 30 days postop upon d/c , SCDs, ambulation Pain control: PRN Follow - up plan:  10-14 days postop Contact information:  Margarita Rana MD, Levester Fresh PA-C  Dispo:  PT recommends CIR. Patient hopeful to qualify as he worries his wife cannot safely help him currently at home. Ok to d/c from ortho standpoint whenever medically ready.     Jenne Pane, PA-C Office [phone] 05/02/2023, 10:04 AM

## 2023-05-02 NOTE — Plan of Care (Signed)

## 2023-05-02 NOTE — Anesthesia Postprocedure Evaluation (Signed)
 Anesthesia Post Note  Patient: Paul [name].  Procedure(s) Performed: BILATERAL QUADRICEP TENDON REPAIR (Bilateral)     Patient location during evaluation: PACU Anesthesia Type: Regional and General Level of consciousness: awake and alert Pain management: pain level controlled Vital Signs Assessment: post-procedure vital signs reviewed and stable Respiratory status: spontaneous breathing, nonlabored ventilation, respiratory function stable and patient connected to nasal cannula oxygen Cardiovascular status: blood pressure returned to baseline and stable Postop Assessment: no apparent nausea or vomiting Anesthetic complications: no  No notable events documented.  Last Vitals:  Vitals:   05/02/23 0443 05/02/23 1358  BP: (!) 154/73 138/74  Pulse: 76 92  Resp: 18 18  Temp: (!) 36.3 C 36.7 C  SpO2: 98% 96%    Last Pain:  Vitals:   05/02/23 1627  TempSrc:   PainSc: 6    Pain Goal: Patients Stated Pain Goal: 2 (05/02/23 1550)                 [name]

## 2023-05-02 NOTE — Progress Notes (Signed)
 Physical Therapy Treatment Patient Details Name: Paul [name]. MRN: [id number] DOB: 11-08-1945 Today's Date: 05/02/2023   History of Present Illness Pt is a 78 y.o. male who fell onto B knees while side stepping on wet grass walking his dog and hyperflexed B knees sustaining a B quad tendon tear and large hematoma. S/p operative repair and is now WBAT in immobilizers for transfers only otherwise NWB PMH: HTN HLD, COPD, CHF    PT Comments  Pt utilized gait belts looped on B feet as leg lifters to advance BLEs to edge of bed, min assist to raise trunk upright into sitting. +2 mod assist to stand from elevated bed, pt then able to take a few steps to transfer bed to recliner. Pt reports increased pain level today, likely due to blocks wearing off. Good progress with mobility today.     If plan is discharge home, recommend the following: Two people to help with walking and/or transfers;A lot of help with bathing/dressing/bathroom;Assistance with cooking/housework;Assist for transportation;Help with stairs or ramp for entrance   Can travel by private vehicle        Equipment Recommendations  Wheelchair (measurements PT);Wheelchair cushion (measurements PT);BSC/3in1;Rolling walker (2 wheels);Other (comment) (WC with elevating leg rests)    Recommendations for Other Services Rehab consult     Precautions / Restrictions Precautions Precaution/Restrictions Comments: B knee immobilizers Required Braces or Orthoses: Knee Immobilizer - Right;Knee Immobilizer - Left Knee Immobilizer - Right: On at all times Knee Immobilizer - Left: On at all times Restrictions Weight Bearing Restrictions Per Provider Order: Yes RLE Weight Bearing Per Provider Order: Non weight bearing LLE Weight Bearing Per Provider Order: Non weight bearing Other Position/Activity Restrictions: Per orders : ok to weightbear for STPs only; per secure chat with Meghan, ortho PA, pt may do quad sets and SLR     Mobility   Bed Mobility Overal bed mobility: Needs Assistance Bed Mobility: Supine to Sit     Supine to sit: HOB elevated, Min assist, Used rails     General bed mobility comments: pt advanced BLEs to EOB using gait belts looped around feet as leg lifters, min A to raise trunk with HOB elevated, used rail    Transfers Overall transfer level: Needs assistance Equipment used: Rolling walker (2 wheels) Transfers: Sit to/from Stand Sit to Stand: +2 physical assistance, From elevated surface, Mod assist   Step pivot transfers: +2 safety/equipment, Min assist       General transfer comment: +2 to power up from highly elevated bed, pt able to take several pivotal steps with RW bed to recliner    Ambulation/Gait                   Stairs             Wheelchair Mobility     Tilt Bed    Modified Rankin (Stroke Patients Only)       Balance Overall balance assessment: Needs assistance Sitting-balance support: Feet supported, No upper extremity supported Sitting balance-Leahy Scale: Fair     Standing balance support: Bilateral upper extremity supported, Reliant on assistive device for balance, During functional activity Standing balance-Leahy Scale: Poor Standing balance comment: reliant upon BUE support                            Communication Communication Communication: No apparent difficulties  Cognition Arousal: Alert Behavior During Therapy: WFL for tasks assessed/performed   PT - Cognitive  impairments: No apparent impairments                         Following commands: Intact      Cueing    Exercises General Exercises - Lower Extremity Ankle Circles/Pumps: AROM, Both, 10 reps, Supine Quad Sets: AROM, Both, 5 reps, Supine    General Comments        Pertinent Vitals/Pain Pain Assessment Pain Score: 7  Pain Location: B LEs (L more than R) Pain Descriptors / Indicators: Sore Pain Intervention(s): Limited activity within  patient's tolerance, Monitored during session, Premedicated before session, Patient requesting pain meds-RN notified, RN gave pain meds during session, Repositioned    Home Living                          Prior Function            PT Goals (current goals can now be found in the care plan section) Acute Rehab PT Goals 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 Progress towards PT goals: Progressing toward goals    Frequency    Min 1X/week      PT Plan      Co-evaluation              AM-PAC PT "6 Clicks" Mobility   Outcome Measure  Help needed turning from your back to your side while in a flat bed without using bedrails?: A Little Help needed moving from lying on your back to sitting on the side of a flat bed without using bedrails?: A Lot Help needed moving to and from a bed to a chair (including a wheelchair)?: A Little Help needed standing up from a chair using your arms (e.g., wheelchair or bedside chair)?: Total Help needed to walk in hospital room?: Total Help needed climbing 3-5 steps with a railing? : Total 6 Click Score: 11    End of Session Equipment Utilized During Treatment: Gait belt Activity Tolerance: Patient tolerated treatment well Patient left: in chair;with call bell/phone within reach Nurse Communication: Mobility status PT Visit Diagnosis: Difficulty in walking, not elsewhere classified (R26.2);Pain;Muscle weakness (generalized) (M62.81);Other abnormalities of gait and mobility (R26.89) Pain - Right/Left:  (both) Pain - part of body: Knee     Time: 1120-1146 PT Time Calculation (min) (ACUTE ONLY): 26 min  Charges:    $Therapeutic Activity: 23-37 mins PT General Charges $$ ACUTE PT VISIT: 1  Visit                     Tamala Ser PT 05/02/2023  Acute Rehabilitation Services  Office [phone]

## 2023-05-03 ENCOUNTER — Other Ambulatory Visit (HOSPITAL_COMMUNITY): Payer: Self-pay

## 2023-05-03 ENCOUNTER — Other Ambulatory Visit: Payer: Self-pay

## 2023-05-03 ENCOUNTER — Inpatient Hospital Stay (HOSPITAL_COMMUNITY)
Admission: AD | Admit: 2023-05-03 | Discharge: 2023-05-09 | DRG: 092 | Disposition: A | Discharge status: Home / Self Care | Payer: BC Managed Care – PPO | Source: Other Acute Inpatient Hospital | Attending: Physical Medicine & Rehabilitation | Admitting: Physical Medicine & Rehabilitation

## 2023-05-03 ENCOUNTER — Encounter (HOSPITAL_COMMUNITY): Payer: Self-pay | Admitting: Internal Medicine

## 2023-05-03 ENCOUNTER — Telehealth (HOSPITAL_COMMUNITY): Payer: Self-pay | Admitting: Pharmacy Technician

## 2023-05-03 ENCOUNTER — Inpatient Hospital Stay (HOSPITAL_COMMUNITY): Payer: BC Managed Care – PPO

## 2023-05-03 ENCOUNTER — Encounter (HOSPITAL_COMMUNITY): Payer: Self-pay | Admitting: Physical Medicine & Rehabilitation

## 2023-05-03 DIAGNOSIS — G4700 Insomnia, unspecified: Secondary | ICD-10-CM | POA: Diagnosis present

## 2023-05-03 DIAGNOSIS — J4489 Other specified chronic obstructive pulmonary disease: Secondary | ICD-10-CM | POA: Diagnosis not present

## 2023-05-03 DIAGNOSIS — M79604 Pain in right leg: Secondary | ICD-10-CM | POA: Diagnosis not present

## 2023-05-03 DIAGNOSIS — W19XXXS Unspecified fall, sequela: Secondary | ICD-10-CM | POA: Diagnosis present

## 2023-05-03 DIAGNOSIS — I4891 Unspecified atrial fibrillation: Secondary | ICD-10-CM | POA: Diagnosis present

## 2023-05-03 DIAGNOSIS — Z860100 Personal history of colon polyps, unspecified: Secondary | ICD-10-CM

## 2023-05-03 DIAGNOSIS — S76119D Strain of unspecified quadriceps muscle, fascia and tendon, subsequent encounter: Secondary | ICD-10-CM

## 2023-05-03 DIAGNOSIS — N400 Enlarged prostate without lower urinary tract symptoms: Secondary | ICD-10-CM | POA: Diagnosis not present

## 2023-05-03 DIAGNOSIS — E782 Mixed hyperlipidemia: Secondary | ICD-10-CM

## 2023-05-03 DIAGNOSIS — K219 Gastro-esophageal reflux disease without esophagitis: Secondary | ICD-10-CM | POA: Diagnosis not present

## 2023-05-03 DIAGNOSIS — K5900 Constipation, unspecified: Secondary | ICD-10-CM | POA: Diagnosis not present

## 2023-05-03 DIAGNOSIS — Z7951 Long term (current) use of inhaled steroids: Secondary | ICD-10-CM | POA: Diagnosis not present

## 2023-05-03 DIAGNOSIS — I10 Essential (primary) hypertension: Secondary | ICD-10-CM

## 2023-05-03 DIAGNOSIS — F419 Anxiety disorder, unspecified: Secondary | ICD-10-CM | POA: Diagnosis not present

## 2023-05-03 DIAGNOSIS — R2689 Other abnormalities of gait and mobility: Principal | ICD-10-CM | POA: Diagnosis present

## 2023-05-03 DIAGNOSIS — Z91048 Other nonmedicinal substance allergy status: Secondary | ICD-10-CM | POA: Diagnosis not present

## 2023-05-03 DIAGNOSIS — M5412 Radiculopathy, cervical region: Secondary | ICD-10-CM | POA: Diagnosis not present

## 2023-05-03 DIAGNOSIS — S76119A Strain of unspecified quadriceps muscle, fascia and tendon, initial encounter: Secondary | ICD-10-CM | POA: Diagnosis not present

## 2023-05-03 DIAGNOSIS — I700 Atherosclerosis of aorta: Secondary | ICD-10-CM

## 2023-05-03 DIAGNOSIS — R931 Abnormal findings on diagnostic imaging of heart and coronary circulation: Secondary | ICD-10-CM | POA: Diagnosis not present

## 2023-05-03 DIAGNOSIS — J449 Chronic obstructive pulmonary disease, unspecified: Secondary | ICD-10-CM

## 2023-05-03 DIAGNOSIS — M79605 Pain in left leg: Secondary | ICD-10-CM | POA: Diagnosis not present

## 2023-05-03 DIAGNOSIS — Z79899 Other long term (current) drug therapy: Secondary | ICD-10-CM

## 2023-05-03 DIAGNOSIS — S76112S Strain of left quadriceps muscle, fascia and tendon, sequela: Secondary | ICD-10-CM | POA: Diagnosis not present

## 2023-05-03 DIAGNOSIS — F988 Other specified behavioral and emotional disorders with onset usually occurring in childhood and adolescence: Secondary | ICD-10-CM | POA: Diagnosis not present

## 2023-05-03 DIAGNOSIS — F32A Depression, unspecified: Secondary | ICD-10-CM | POA: Diagnosis not present

## 2023-05-03 DIAGNOSIS — Z800 Family history of malignant neoplasm of digestive organs: Secondary | ICD-10-CM

## 2023-05-03 DIAGNOSIS — J309 Allergic rhinitis, unspecified: Secondary | ICD-10-CM | POA: Diagnosis present

## 2023-05-03 DIAGNOSIS — K5903 Drug induced constipation: Secondary | ICD-10-CM | POA: Diagnosis not present

## 2023-05-03 DIAGNOSIS — I110 Hypertensive heart disease with heart failure: Secondary | ICD-10-CM | POA: Diagnosis not present

## 2023-05-03 DIAGNOSIS — E785 Hyperlipidemia, unspecified: Secondary | ICD-10-CM | POA: Diagnosis present

## 2023-05-03 DIAGNOSIS — S76111S Strain of right quadriceps muscle, fascia and tendon, sequela: Secondary | ICD-10-CM | POA: Diagnosis not present

## 2023-05-03 DIAGNOSIS — M7989 Other specified soft tissue disorders: Secondary | ICD-10-CM | POA: Diagnosis not present

## 2023-05-03 DIAGNOSIS — Z7901 Long term (current) use of anticoagulants: Secondary | ICD-10-CM

## 2023-05-03 DIAGNOSIS — R066 Hiccough: Secondary | ICD-10-CM | POA: Diagnosis not present

## 2023-05-03 DIAGNOSIS — I5032 Chronic diastolic (congestive) heart failure: Secondary | ICD-10-CM | POA: Diagnosis present

## 2023-05-03 DIAGNOSIS — K5901 Slow transit constipation: Secondary | ICD-10-CM | POA: Diagnosis not present

## 2023-05-03 LAB — ECHOCARDIOGRAM COMPLETE
Area-P 1/2: 4.49 cm2
Height: 72 in
S' Lateral: 2.8 cm
Weight: 3200 [oz_av]

## 2023-05-03 LAB — BASIC METABOLIC PANEL
Anion gap: 10 (ref 5–15)
BUN: 17 mg/dL (ref 8–23)
CO2: 23 mmol/L (ref 22–32)
Calcium: 7.5 mg/dL — ABNORMAL LOW (ref 8.9–10.3)
Chloride: 103 mmol/L (ref 98–111)
Creatinine, Ser: 0.8 mg/dL (ref 0.61–1.24)
GFR, Estimated: >60 mL/min (ref >60)
Glucose, Bld: 112 mg/dL — ABNORMAL HIGH (ref 70–99)
Potassium: 3.7 mmol/L (ref 3.5–5.1)
Sodium: 136 mmol/L (ref 135–145)

## 2023-05-03 LAB — CBC
HCT: 41.5 % (ref 39.0–52.0)
HCT: 40.4 % (ref 39.0–52.0)
Hemoglobin: 13.6 g/dL (ref 13.0–17.0)
Hemoglobin: 13.4 g/dL (ref 13.0–17.0)
MCH: 31.7 pg (ref 26.0–34.0)
MCH: 31.5 pg (ref 26.0–34.0)
MCHC: 32.8 g/dL (ref 30.0–36.0)
MCHC: 33.2 g/dL (ref 30.0–36.0)
MCV: 96.7 fL (ref 80.0–100.0)
MCV: 94.8 fL (ref 80.0–100.0)
Platelets: 252 10*3/uL (ref 150–400)
Platelets: 275 10*3/uL (ref 150–400)
RBC: 4.29 MIL/uL (ref 4.22–5.81)
RBC: 4.26 MIL/uL (ref 4.22–5.81)
RDW: 12.8 % (ref 11.5–15.5)
RDW: 12.9 % (ref 11.5–15.5)
WBC: 9.8 10*3/uL (ref 4.0–10.5)
WBC: 10.3 10*3/uL (ref 4.0–10.5)
nRBC: 0 % (ref 0.0–0.2)
nRBC: 0 % (ref 0.0–0.2)

## 2023-05-03 LAB — HEPATIC FUNCTION PANEL
ALT: 21 U/L (ref 0–44)
AST: 56 U/L — ABNORMAL HIGH (ref 15–41)
Albumin: 2.8 g/dL — ABNORMAL LOW (ref 3.5–5.0)
Alkaline Phosphatase: 34 U/L — ABNORMAL LOW (ref 38–126)
Bilirubin, Direct: 0.2 mg/dL (ref 0.0–0.2)
Indirect Bilirubin: 0.8 mg/dL (ref 0.3–0.9)
Total Bilirubin: 1 mg/dL (ref 0.0–1.2)
Total Protein: 5.6 g/dL — ABNORMAL LOW (ref 6.5–8.1)

## 2023-05-03 LAB — T4, FREE: Free T4: 1.29 ng/dL — ABNORMAL HIGH (ref 0.61–1.12)

## 2023-05-03 LAB — PHOSPHORUS: Phosphorus: 2.4 mg/dL — ABNORMAL LOW (ref 2.5–4.6)

## 2023-05-03 LAB — TSH: TSH: 5.875 u[IU]/mL — ABNORMAL HIGH (ref 0.350–4.500)

## 2023-05-03 LAB — CK: Total CK: 636 U/L — ABNORMAL HIGH (ref 49–397)

## 2023-05-03 LAB — MAGNESIUM: Magnesium: 2.1 mg/dL (ref 1.7–2.4)

## 2023-05-03 LAB — CREATININE, SERUM
Creatinine, Ser: 0.98 mg/dL (ref 0.61–1.24)
GFR, Estimated: >60 mL/min (ref >60)

## 2023-05-03 MED ORDER — BISACODYL 10 MG RE SUPP: 10.0000 mg | Freq: Every day | RECTAL | Status: DC | PRN

## 2023-05-03 MED ORDER — METOPROLOL TARTRATE 25 MG PO TABS
25.0000 mg | ORAL_TABLET | Freq: Two times a day (BID) | ORAL | Status: DC
  Administered 2023-05-03: 25 mg via ORAL
  Filled 2023-05-03: qty 1

## 2023-05-03 MED ORDER — APIXABAN 5 MG PO TABS
5.0000 mg | ORAL_TABLET | Freq: Two times a day (BID) | ORAL | Status: DC
  Administered 2023-05-03 – 2023-05-09 (×12): 5 mg via ORAL
  Filled 2023-05-03 (×12): qty 1

## 2023-05-03 MED ORDER — SORBITOL 70 % SOLN
45.0000 mL | Freq: Once | Status: AC
  Administered 2023-05-03: 45 mL via ORAL
  Filled 2023-05-03: qty 60

## 2023-05-03 MED ORDER — ONDANSETRON HCL 4 MG PO TABS: 4.0000 mg | ORAL_TABLET | Freq: Four times a day (QID) | ORAL | Status: DC | PRN

## 2023-05-03 MED ORDER — MOMETASONE FURO-FORMOTEROL FUM 100-5 MCG/ACT IN AERO
2.0000 | INHALATION_SPRAY | Freq: Two times a day (BID) | RESPIRATORY_TRACT | Status: DC
  Administered 2023-05-03 – 2023-05-09 (×12): 2 via RESPIRATORY_TRACT
  Filled 2023-05-03: qty 8.8

## 2023-05-03 MED ORDER — APIXABAN 5 MG PO TABS
5.0000 mg | ORAL_TABLET | Freq: Two times a day (BID) | ORAL | Status: DC
  Administered 2023-05-03: 5 mg via ORAL
  Filled 2023-05-03: qty 1

## 2023-05-03 MED ORDER — METHOCARBAMOL 1000 MG/10ML IJ SOLN: 500.0000 mg | Freq: Four times a day (QID) | INTRAMUSCULAR | Status: DC | PRN

## 2023-05-03 MED ORDER — ZOLPIDEM TARTRATE 5 MG PO TABS
5.0000 mg | ORAL_TABLET | Freq: Every evening | ORAL | Status: DC | PRN
  Administered 2023-05-04 – 2023-05-08 (×5): 5 mg via ORAL
  Filled 2023-05-03 (×6): qty 1

## 2023-05-03 MED ORDER — HYDROCORTISONE 0.5 % EX CREA
TOPICAL_CREAM | Freq: Three times a day (TID) | CUTANEOUS | Status: DC | PRN
  Filled 2023-05-03: qty 28.35

## 2023-05-03 MED ORDER — FINASTERIDE 5 MG PO TABS
5.0000 mg | ORAL_TABLET | Freq: Every day | ORAL | Status: DC
  Administered 2023-05-04 – 2023-05-09 (×6): 5 mg via ORAL
  Filled 2023-05-03 (×6): qty 1

## 2023-05-03 MED ORDER — METOPROLOL TARTRATE 25 MG PO TABS: 25.0000 mg | ORAL_TABLET | Freq: Two times a day (BID) | ORAL | Status: DC

## 2023-05-03 MED ORDER — DOCUSATE SODIUM 100 MG PO CAPS
100.0000 mg | ORAL_CAPSULE | Freq: Two times a day (BID) | ORAL | Status: DC
  Administered 2023-05-03 – 2023-05-05 (×4): 100 mg via ORAL
  Filled 2023-05-03 (×4): qty 1

## 2023-05-03 MED ORDER — BUPROPION HCL 75 MG PO TABS
150.0000 mg | ORAL_TABLET | Freq: Every day | ORAL | Status: DC
  Administered 2023-05-04 – 2023-05-07 (×4): 150 mg via ORAL
  Filled 2023-05-03 (×4): qty 2

## 2023-05-03 MED ORDER — APIXABAN 5 MG PO TABS: 5.0000 mg | ORAL_TABLET | Freq: Two times a day (BID) | ORAL | Status: DC

## 2023-05-03 MED ORDER — OXYCODONE HCL 5 MG PO TABS
5.0000 mg | ORAL_TABLET | ORAL | Status: DC | PRN
  Administered 2023-05-03: 5 mg via ORAL
  Administered 2023-05-03 – 2023-05-04 (×5): 10 mg via ORAL
  Filled 2023-05-03 (×6): qty 2

## 2023-05-03 MED ORDER — ALBUTEROL SULFATE (2.5 MG/3ML) 0.083% IN NEBU: 2.5000 mg | INHALATION_SOLUTION | RESPIRATORY_TRACT | Status: DC | PRN

## 2023-05-03 MED ORDER — METOPROLOL TARTRATE 5 MG/5ML IV SOLN
2.5000 mg | INTRAVENOUS | Status: AC | PRN
  Administered 2023-05-03 (×2): 2.5 mg via INTRAVENOUS
  Filled 2023-05-03 (×2): qty 5

## 2023-05-03 MED ORDER — ONDANSETRON HCL 4 MG/2ML IJ SOLN: 4.0000 mg | Freq: Four times a day (QID) | INTRAMUSCULAR | Status: DC | PRN

## 2023-05-03 MED ORDER — POLYETHYLENE GLYCOL 3350 17 G PO PACK: 17.0000 g | PACK | Freq: Every day | ORAL | 0 refills | Status: DC | PRN

## 2023-05-03 MED ORDER — ACETAMINOPHEN 325 MG PO TABS
325.0000 mg | ORAL_TABLET | Freq: Four times a day (QID) | ORAL | Status: DC | PRN
  Administered 2023-05-05: 325 mg via ORAL
  Administered 2023-05-07 – 2023-05-08 (×3): 650 mg via ORAL
  Filled 2023-05-03 (×4): qty 2

## 2023-05-03 MED ORDER — POLYETHYLENE GLYCOL 3350 17 G PO PACK
17.0000 g | PACK | Freq: Every day | ORAL | Status: DC | PRN
  Administered 2023-05-03 – 2023-05-04 (×2): 17 g via ORAL
  Filled 2023-05-03 (×2): qty 1

## 2023-05-03 MED ORDER — METOPROLOL TARTRATE 25 MG PO TABS
25.0000 mg | ORAL_TABLET | Freq: Two times a day (BID) | ORAL | Status: DC
Start: 2023-05-03 — End: 2023-05-09
  Administered 2023-05-03 – 2023-05-09 (×12): 25 mg via ORAL
  Filled 2023-05-03 (×13): qty 1

## 2023-05-03 MED ORDER — METHOCARBAMOL 500 MG PO TABS
500.0000 mg | ORAL_TABLET | Freq: Four times a day (QID) | ORAL | Status: DC | PRN
  Administered 2023-05-04 – 2023-05-08 (×8): 500 mg via ORAL
  Filled 2023-05-03 (×9): qty 1

## 2023-05-03 MED ORDER — PANTOPRAZOLE SODIUM 40 MG PO TBEC
40.0000 mg | DELAYED_RELEASE_TABLET | Freq: Every day | ORAL | Status: DC
  Administered 2023-05-04 – 2023-05-09 (×6): 40 mg via ORAL
  Filled 2023-05-03 (×6): qty 1

## 2023-05-03 NOTE — H&P (Addendum)
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 documentation from the acute care chart have been noted above.  Fanny Dance, MD, [place]

## 2023-05-03 NOTE — Progress Notes (Signed)
 Pt converted from NSR to Afib Rhythmin on telemetry monitor. HR sustained in 120's up to 150's. Pt asymptomatic. Notified A.Virgel Manifold, NP and obtained an EKG which confirmed that pt was in Afib with RVR.  Rest of VS stable. Pt given prn pain medication for pain on bilateral knees. Pt resting comfortably in bed.
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
## 2023-05-03 NOTE — Telephone Encounter (Signed)
 Patient Product/process development scientist completed.    The patient is insured through [facility]. Patient has [employer], may use a copay card, and/or apply for patient assistance if available.    Ran test claim for Eliquis 5 mg and the current 30 day co-pay is $60.00.  Ran test claim for Xarelto 20 mg and the current 30 day co-pay is $60.00.  This test claim was processed through [facility]- copay amounts may vary at other pharmacies due to pharmacy/plan contracts, or as the patient moves through the different stages of their insurance plan.     Roland Earl, CPHT Pharmacy Technician III Certified Patient Advocate [facility] Patient Advocate Team Direct Number: [phone]  Fax: [phone]

## 2023-05-03 NOTE — Progress Notes (Addendum)
 Physical Therapy Treatment Patient Details Name: Paul [name]. MRN: [id number] DOB: 1945/11/27 Today's Date: 05/03/2023   History of Present Illness Pt is a 78 y.o. male who fell onto B knees while side stepping on wet grass walking his dog and hyperflexed B knees sustaining a B quad tendon tear and large hematoma. S/p operative repair and is now WBAT in immobilizers for transfers only otherwise NWB PMH: HTN HLD, COPD, CHF    PT Comments  Noted pt had afib event last night, per nurse cardiac workup has been negative, he is now in sinus rhythm. Pt is progressing with mobility. +2 min assist to stand from highly elevated bed with B KIs donned. Pt able to take several pivotal steps to recliner with RW. Verbally reviewed HEP (quad sets, SLR, ankle pumps) to be done independently, did not perform these as cardiologist entered room.  He reports decreased pain today compared to yesterday. Pt puts forth good effort. Plan is to DC to inpatient rehab later today.     If plan is discharge home, recommend the following: Two people to help with walking and/or transfers;A lot of help with bathing/dressing/bathroom;Assistance with cooking/housework;Assist for transportation;Help with stairs or ramp for entrance   Can travel by private vehicle        Equipment Recommendations  Wheelchair (measurements PT);Wheelchair cushion (measurements PT);BSC/3in1;Rolling walker (2 wheels);Other (comment) (WC with elevating leg rests)    Recommendations for Other Services Rehab consult     Precautions / Restrictions Precautions Precaution/Restrictions Comments: B knee immobilizers Required Braces or Orthoses: Knee Immobilizer - Right;Knee Immobilizer - Left Knee Immobilizer - Right: On at all times Knee Immobilizer - Left: On at all times Restrictions Weight Bearing Restrictions Per Provider Order: Yes RLE Weight Bearing Per Provider Order: Non weight bearing LLE Weight Bearing Per Provider Order: Non weight  bearing Other Position/Activity Restrictions: Per orders : ok to weightbear for STPs only; per secure chat with Meghan, ortho PA, pt may do quad sets and SLR     Mobility  Bed Mobility         Supine to sit: HOB elevated, Min assist, Used rails     General bed mobility comments: pt advanced BLEs to EOB using gait belts looped around feet as leg lifters,  with HOB elevated, used rail    Transfers Overall transfer level: Needs assistance Equipment used: Rolling walker (2 wheels) Transfers: Sit to/from Stand Sit to Stand: +2 physical assistance, From elevated surface, Min assist   Step pivot transfers: +2 safety/equipment, Min assist       General transfer comment: +2 to power up from highly elevated bed, pt able to take several pivotal steps with RW bed to recliner; HR 80s with activity    Ambulation/Gait                   Stairs             Wheelchair Mobility     Tilt Bed    Modified Rankin (Stroke Patients Only)       Balance Overall balance assessment: Needs assistance Sitting-balance support: Feet supported, No upper extremity supported Sitting balance-Leahy Scale: Fair     Standing balance support: Bilateral upper extremity supported, Reliant on assistive device for balance, During functional activity Standing balance-Leahy Scale: Poor Standing balance comment: reliant upon BUE support  Communication Communication Communication: No apparent difficulties  Cognition Arousal: Alert Behavior During Therapy: WFL for tasks assessed/performed   PT - Cognitive impairments: No apparent impairments                         Following commands: Intact      Cueing    Exercises      General Comments        Pertinent Vitals/Pain Pain Assessment Pain Score: 5  Pain Location: B LEs (L more than R) Pain Descriptors / Indicators: Sore Pain Intervention(s): Limited activity within patient's  tolerance, Monitored during session, Repositioned, Premedicated before session    Home Living                          Prior Function            PT Goals (current goals can now be found in the care plan section) Acute Rehab PT Goals 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 Progress towards PT goals: Progressing toward goals    Frequency    Min 1X/week      PT Plan      Co-evaluation              AM-PAC PT "6 Clicks" Mobility   Outcome Measure  Help needed turning from your back to your side while in a flat bed without using bedrails?: A Little Help needed moving from lying on your back to sitting on the side of a flat bed without using bedrails?: A Lot Help needed moving to and from a bed to a chair (including a wheelchair)?: A Little Help needed standing up from a chair using your arms (e.g., wheelchair or bedside chair)?: A Lot Help needed to walk in hospital room?: A Lot Help needed climbing 3-5 steps with a railing? : Total 6 Click Score: 13    End of Session Equipment Utilized During Treatment: Gait belt Activity Tolerance: Patient tolerated treatment well Patient left: in chair;with call bell/phone within reach;with chair alarm set Nurse Communication: Mobility status PT Visit Diagnosis: Difficulty in walking, not elsewhere classified (R26.2);Pain;Muscle weakness (generalized) (M62.81);Other abnormalities of gait and mobility (R26.89) Pain - Right/Left:  (both) Pain - part of body: Knee     Time: 1214-1229 PT Time Calculation (min) (ACUTE ONLY): 15 min  Charges:    $Therapeutic Activity: 8-22 mins PT General Charges $$ ACUTE PT VISIT: 1 Visit                     Tamala Ser PT  05/03/2023  Acute Rehabilitation Services  Office [phone]

## 2023-05-03 NOTE — Progress Notes (Signed)
 Cardiology is signing off, but per chat with IM and CIR, EKG planned for tomorrow morning at CIR to ensure maintaining NSR. Please reach out to cardiology of there are any concerns based on this EKG. Otherwise plan outpatient follow-up.
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
## 2023-05-03 NOTE — TOC Transition Note (Signed)
 Transition of Care [facility]) - Discharge Note   Patient Details  Name: Paul [name]. MRN: [id number] Date of Birth: Jun 09, 1945  Transition of [facility]) CM/SW Contact:  Larrie Kass, LCSW Phone Number: 05/03/2023, 1:33 PM   Clinical Narrative:     Pt to d/c to CIR , no further TOC needs TOC sign off.   Final next level of care: IP Rehab Facility Barriers to Discharge: No Barriers Identified   Patient Goals and CMS Choice Patient states their goals for this hospitalization and ongoing recovery are:: CIR to get stronger          Discharge Placement                       Discharge Plan and Services Additional resources added to the After Visit Summary for   In-house Referral: Clinical Social Work                                   Social Drivers of Health (SDOH) Interventions SDOH Screenings   Food Insecurity: No Food Insecurity (04/29/2023)  Housing: Low Risk  (04/29/2023)  Transportation Needs: No Transportation Needs (04/29/2023)  Utilities: Not At Risk (04/29/2023)  Depression (PHQ2-9): Low Risk  (04/16/2023)  Social Connections: Socially Integrated (04/29/2023)  Tobacco Use: Medium Risk (04/29/2023)     Readmission Risk Interventions    05/01/2023    1:40 PM  Readmission Risk Prevention Plan  Transportation Screening Complete  PCP or Specialist Appt within 5-7 Days Complete  Home Care Screening Complete  Medication Review (RN CM) Complete

## 2023-05-03 NOTE — Progress Notes (Signed)
 Inpatient Rehabilitation Admissions Coordinator   I have insurance approval and CIR bed to admit him to today. I await medical clearance to admit. Acute team and TOC made aware. Noted Cardiology consultation due to afib with RVR.Ottie Glazier, RN, MSN Rehab Admissions Coordinator [phone] 05/03/2023 11:15 AM

## 2023-05-03 NOTE — Progress Notes (Signed)
  Inpatient Rehabilitation Admissions Coordinator   I will arrange [facility] transport to CIR with Dr Riley Kill admitting MD. Acute team and [facility] made aware.  Ottie Glazier, RN, MSN Rehab Admissions Coordinator [phone] 05/03/2023 1:28 PM

## 2023-05-03 NOTE — Progress Notes (Addendum)
    Patient Name: Paul [name].           DOB: 07-Apr-1945  MRN: [id number]      Admission Date: 04/28/2023  Attending Provider: Lanae Boast, MD  Primary Diagnosis: Rupture of distal quadriceps tendon   Level of care: Telemetry    CROSS COVER NOTE   Date of Service   05/03/2023   Paul [name]., 78 y.o. male, was admitted on 04/28/2023 for Rupture of distal quadriceps tendon.    HPI/Events of Note   Atrial Fibrillation with RVR; HR 120-130 New onset  Currently asymptomatic.  Denies lightheadedness, CP, palpitations, abdominal discomfort, N/V. Hemodynamically stable. BP 128/77. Patient has received IV metoprolol. HR now controlled, HR 90- 100's Admitted for traumatic hematoma of right knee and left knee with quadriceps tendon. Underwent quadricep tendon repair 04/30/23. Currently on Lovenox for VTE prophylaxis.    Interventions/ Plan   EKG --> A-fib RVR Cardiac Telemetry IV lopressor Labs --> BMP, Mag, phosp, TSH        Anthoney Harada, DNP, [employer] [name] [place]

## 2023-05-03 NOTE — Discharge Summary (Signed)
 Physician Discharge Summary  Paul [name]. ZOX:[id number] DOB: Dec 27, 1945 DOA: 04/28/2023  PCP: Tresa Garter, MD  Admit date: 04/28/2023 Discharge date: 05/03/2023 Recommendations for Outpatient Follow-up:  Follow up with PCP in 1 weeks-call for appointment Please obtain BMP/CBC in one week  Discharge Dispo: CIR Discharge Condition: Stable Code Status:   Code Status: Full Code Diet recommendation:  Diet Order             Diet Heart Room service appropriate? Yes; Fluid consistency: Thin  Diet effective now                    Brief/Interim Summary: 78 yom w/ Hx of  HTN HLD , COPD, left hamstring injury presented to hospital with severe right leg swelling after mechanical fall while walking the dog.In the ED: Initially slightly low blood pressure labs showed leukocytosis at 19.0.  CK slightly elevated at 702. CT scan of the femur on the right  > showed large volume anterior knee subcutaneous soft tissue hematom Placed on IV fluids pain management orthopedic consulted and admitted MRI of bilateral knee area-has bilateral quad tendon rupture and other findings and subsequently underwent quadricep tendon repair 04/30/23 Orthopedics following advised WBAT RLE and LLE in KI for people transfer and small steps with walker essentially "tingling" movement no knee ROM.  PT OT following and recommending CIR.  DVT prophylaxis with Lovenox while inpatient switch to Eliquis or Xarelto x 30 days discharge and follow-up with orthopedics in 10 to 14 days postop.  With ongoing IV fluid hydration leukocytosis resolved CK downtrending.  At this time CIR has been approved and is stable for discharge. 2/20 night went into new onset a fib-asymptomatic and rate is controlled-cardiology consulted TSH echo are ordered Echo showed EF 60-65% no RWMA, RV systolic function normal size mildly enlarged.  Patient converted to normal sinus rhythm overall doing well.  Ortho has cleared the patient for DOAC  which patient will need given PAF and planing for 4 wk for now per cardio.  TSH up slightly at 5.8. so FT4 ordered pending at the time of discharge. Patient is being discharged to CIR today.   Discharge Diagnoses:  Principal Problem:   Rupture of bilateral distal quadriceps tendon Active Problems:   Dyslipidemia   Hypertension   COPD, mild (HCC)   Thigh hematoma   Hemarthrosis of right knee   Intractable pain   New onset atrial fibrillation (HCC)   Atherosclerosis of aorta (HCC)   Mixed hyperlipidemia   Agatston coronary artery calcium score less than 100   Benign hypertension  Bilateral quadricep tendon rupture 2/2 fall: MRI b/l knees bilateral quad tendon rupture w/ knee DJD, knee joint effusion 2/2 fall. underwent quadricep tendon repair 04/30/23. CK slightly elevated and has leukocytosis likely postop, no fever.  Trend CBC CMP and keep on IV fluid hydration Orthopedics advised WBAT RLE and LLE in KI for people transfer and small steps with walker essentially "tingling" movement no knee ROM.  PT OT following and recommending CIR.  DVT prophylaxis with Lovenox while inpatient switch to Eliquis or Xarelto x 30 days discharge and follow-up with orthopedics in 10 to 14 days postopappreciate orthopedic input continue pain management with scheduled Tylenol, p.o./IV opiates PRN and PT OT and further plan as per orthopedics Patient is being transferred to CIR  Mild leukocytosis: Likely reactive, postop possibly from muscle tear.  Resolved Recent Labs  Lab 04/30/23 0544 04/30/23 1834 05/01/23 0324 05/02/23 0322 05/03/23 0315  WBC  11.2* 13.2* 14.4* 16.2* 9.8   Paf: 2/20 night went into new onset a fib-asymptomatic and rate is controlled-cardiology consulted TSH echo are ordered Echo showed EF 60-65% no RWMA, RV systolic function normal size mildly enlarged.  Patient converted to normal sinus rhythm overall doing well.  Ortho has cleared the patient for DOAC which patient will need given  PAF and planing for 4 wk for now per cardio.  TSH up slightly at 5.8. so FT4 ordered pending at the time of discharge. Patient is being discharged to CIR today.   Hypertension: BP stable on Diovan  HLD: Hold off statins and tricor 2/2 mild rhabdomyolysis  Chronic diastolic heart failure: Last echo with G1 DD EF 65 to 70%, monitor volume status  Mild traumatic rhabdomyolysis: Improving encourage oral hydration  Recent Labs  Lab 04/28/23 1753 04/29/23 0807 04/30/23 0544 05/01/23 0324 05/02/23 0322 05/03/23 0315  CKTOTAL 702* 1,098* 1,202* 1,553* 1,545* 636*    COPD: Not in exacerbation followed by pulmonary outpatient continue inhaler as needed   Consults: Orthopedics Subjective: Alert awake oriented resting comfortably continue to rehab today.  Feels tired but no palpitation or chest pain  Discharge Exam: Vitals:   05/03/23 0558 05/03/23 0936  BP:  139/71  Pulse: 92 89  Resp:    Temp:    SpO2:     General: Pt is alert, awake, not in acute distress Cardiovascular: RRR, S1/S2 +, no rubs, no gallops Respiratory: CTA bilaterally, no wheezing, no rhonchi Abdominal: Soft, NT, ND, bowel sounds + Extremities: no edema, no cyanosis  Discharge Instructions  Discharge Instructions     Discharge instructions   Complete by: As directed    Please call call MD or return to ER for similar or worsening recurring problem that brought you to hospital or if any fever,nausea/vomiting,abdominal pain, uncontrolled pain, chest pain,  shortness of breath or any other alarming symptoms.  Please follow-up your doctor as instructed in a week time and call the office for appointment.  Please avoid alcohol, smoking, or any other illicit substance and maintain healthy habits including taking your regular medications as prescribed.  You were cared for by a hospitalist during your hospital stay. If you have any questions about your discharge medications or the care you received while you were  in the hospital after you are discharged, you can call the unit and ask to speak with the hospitalist on call if the hospitalist that took care of you is not available.  Once you are discharged, your primary care physician will handle any further medical issues. Please note that NO REFILLS for any discharge medications will be authorized once you are discharged, as it is imperative that you return to your primary care physician (or establish a relationship with a primary care physician if you do not have one) for your aftercare needs so that they can reassess your need for medications and monitor your lab values   Increase activity slowly   Complete by: As directed       Allergies as of 05/03/2023       Reactions   Other Itching   PATCHES. Patient states that any patch on his skin causes him to itch         Medication List     STOP taking these medications    valsartan 160 MG tablet Commonly known as: DIOVAN       TAKE these medications    albuterol 108 (90 Base) MCG/ACT inhaler Commonly known as: VENTOLIN HFA  Inhale 2 puffs into the lungs every 6 (six) hours as needed for wheezing or shortness of breath.   apixaban 5 MG Tabs tablet Commonly known as: ELIQUIS Take 1 tablet (5 mg total) by mouth 2 (two) times daily for 27 days.   atorvastatin 10 MG tablet Commonly known as: LIPITOR Take 1 tablet (10 mg total) by mouth daily.   budesonide-formoterol 80-4.5 MCG/ACT inhaler Commonly known as: Symbicort Inhale 2 puffs into the lungs 2 (two) times daily.   buPROPion 150 MG 24 hr tablet Commonly known as: Wellbutrin XL Take 1 tablet (150 mg total) by mouth daily.   cholecalciferol 25 MCG (1000 UNIT) tablet Commonly known as: VITAMIN D3 Take 1,000 Units by mouth daily.   finasteride 5 MG tablet Commonly known as: PROSCAR Take 1 tablet by mouth daily.   metoprolol tartrate 25 MG tablet Commonly known as: LOPRESSOR Take 1 tablet (25 mg total) by mouth 2 (two) times  daily.   multivitamin with minerals Tabs tablet Take 1 tablet by mouth daily.   pantoprazole 40 MG tablet Commonly known as: PROTONIX Take 1 tablet (40 mg total) by mouth daily. Annual appt due in Oct must see provider for future refills   polyethylene glycol 17 g packet Commonly known as: MIRALAX / GLYCOLAX Take 17 g by mouth daily as needed for mild constipation.   Spacer/Aero-Holding [employer] Use with Symbicort inhaler   SYRINGE-NEEDLE (DISP) 3 ML 23G X 1" 3 ML Misc As directed IM weekly   testosterone cypionate 200 MG/ML injection Commonly known as: DEPOTESTOSTERONE CYPIONATE ADMINISTER 0.5 ML(100 MG) IN THE MUSCLE 1 TIME A WEEK   WELLMIND MENTAL FOCUS PO Take 1 tablet by mouth daily.   zolpidem 10 MG tablet Commonly known as: AMBIEN TAKE 1 TABLET BY MOUTH AT BEDTIME AS NEEDED        Follow-up Information     Sheral Apley, MD. Schedule an appointment as soon as possible for a visit in 2 week(s).   Specialty: Orthopedic Surgery Why: You need to be seen in the office 10-14 weeks after surgery for wound checks Contact information: [address] [place] [address] [phone]         Laurann Montana, PA-C Follow up.   Specialties: Cardiology, Radiology Why: [facility] - [address] location - cardiology follow-up with PA Dayna on Tuesday May 28, 2023 1:55 PM (Arrive by 1:40 PM). Contact information: [address] [place] [address] [phone]                Allergies  Allergen Reactions   Other Itching    PATCHES. Patient states that any patch on his skin causes him to itch     The results of significant diagnostics from this hospitalization (including imaging, microbiology, ancillary and laboratory) are listed below for reference.    Microbiology: No results found for this or any previous visit (from the past 240 hours).  Procedures/Studies: ECHOCARDIOGRAM COMPLETE Result Date:  05/03/2023    ECHOCARDIOGRAM REPORT   Patient Name:   Zayan Delvecchio. Date of Exam: 05/03/2023 Medical Rec #:  [id number]            Height:       72.0 in Accession #:    [id number]           Weight:       200.0 lb Date of Birth:  11-21-1945  BSA:          2.131 m Patient Age:    77 years             BP:           139/71 mmHg Patient Gender: M                    HR:           73 bpm. Exam Location:  Inpatient Procedure: 2D Echo, Color Doppler and Cardiac Doppler (Both Spectral and Color            Flow Doppler were utilized during procedure). Indications:    Atrial Fibrillation I48.91  History:        Patient has prior history of Echocardiogram examinations, most                 recent 03/09/2019. COPD; Risk Factors:Hypertension and                 Dyslipidemia.  Sonographer:    Delton Coombes Referring Phys: [id number] Rufino Staup IMPRESSIONS  1. Left ventricular ejection fraction, by estimation, is 60 to 65%. The left ventricle has normal function. The left ventricle has no regional wall motion abnormalities. There is mild concentric left ventricular hypertrophy. Left ventricular diastolic parameters were normal.  2. Right ventricular systolic function is normal. The right ventricular size is mildly enlarged. There is normal pulmonary artery systolic pressure. The estimated right ventricular systolic pressure is 23.1 mmHg.  3. The mitral valve is grossly normal. Trivial mitral valve regurgitation. No evidence of mitral stenosis.  4. The aortic valve is tricuspid. Aortic valve regurgitation is not visualized. No aortic stenosis is present.  5. The inferior vena cava is dilated in size with >50% respiratory variability, suggesting right atrial pressure of 8 mmHg. FINDINGS  Left Ventricle: Left ventricular ejection fraction, by estimation, is 60 to 65%. The left ventricle has normal function. The left ventricle has no regional wall motion abnormalities. Strain imaging was not performed. The left ventricular  internal cavity  size was normal in size. There is mild concentric left ventricular hypertrophy. Left ventricular diastolic parameters were normal. Right Ventricle: The right ventricular size is mildly enlarged. No increase in right ventricular wall thickness. Right ventricular systolic function is normal. There is normal pulmonary artery systolic pressure. The tricuspid regurgitant velocity is 1.94  m/s, and with an assumed right atrial pressure of 8 mmHg, the estimated right ventricular systolic pressure is 23.1 mmHg. Left Atrium: Left atrial size was normal in size. Right Atrium: Right atrial size was normal in size. Pericardium: There is no evidence of pericardial effusion. Mitral Valve: The mitral valve is grossly normal. Trivial mitral valve regurgitation. No evidence of mitral valve stenosis. Tricuspid Valve: The tricuspid valve is grossly normal. Tricuspid valve regurgitation is trivial. No evidence of tricuspid stenosis. Aortic Valve: The aortic valve is tricuspid. Aortic valve regurgitation is not visualized. No aortic stenosis is present. Pulmonic Valve: The pulmonic valve was grossly normal. Pulmonic valve regurgitation is not visualized. No evidence of pulmonic stenosis. Aorta: The aortic root and ascending aorta are structurally normal, with no evidence of dilitation. Venous: The inferior vena cava is dilated in size with greater than 50% respiratory variability, suggesting right atrial pressure of 8 mmHg. IAS/Shunts: The atrial septum is grossly normal. Additional Comments: 3D imaging was not performed.  LEFT VENTRICLE PLAX 2D LVIDd:         4.40 cm   Diastology LVIDs:  2.80 cm   LV e' medial:    9.25 cm/s LV PW:         1.10 cm   LV E/e' medial:  9.2 LV IVS:        1.30 cm   LV e' lateral:   8.05 cm/s LVOT diam:     2.20 cm   LV E/e' lateral: 10.5 LV SV:         65 LV SV Index:   31 LVOT Area:     3.80 cm  RIGHT VENTRICLE             IVC RV Basal diam:  4.30 cm     IVC diam: 2.50 cm RV Mid  diam:    3.40 cm RV S prime:     15.20 cm/s TAPSE (M-mode): 2.7 cm LEFT ATRIUM             Index        RIGHT ATRIUM           Index LA diam:        3.40 cm 1.60 cm/m   RA Area:     18.20 cm LA Vol (A2C):   23.1 ml 10.84 ml/m  RA Volume:   45.60 ml  21.40 ml/m LA Vol (A4C):   41.2 ml 19.34 ml/m LA Biplane Vol: 30.8 ml 14.46 ml/m  AORTIC VALVE LVOT Vmax:   87.50 cm/s LVOT Vmean:  59.400 cm/s LVOT VTI:    0.171 m  AORTA Ao Root diam: 3.30 cm Ao Asc diam:  3.70 cm MITRAL VALVE               TRICUSPID VALVE MV Area (PHT): 4.49 cm    TR Peak grad:   15.1 mmHg MV Decel Time: 169 msec    TR Vmax:        194.00 cm/s MV E velocity: 84.80 cm/s MV A velocity: 54.20 cm/s  SHUNTS MV E/A ratio:  1.56        Systemic VTI:  0.17 m                            Systemic Diam: 2.20 cm Lennie Odor MD Electronically signed by Lennie Odor MD Signature Date/Time: 05/03/2023/11:15:07 AM    Final    MR KNEE LEFT WO CONTRAST Result Date: 04/29/2023 CLINICAL DATA:  Pain status post fall. EXAM: MRI OF THE LEFT KNEE WITHOUT CONTRAST TECHNIQUE: Multiplanar, multisequence MR imaging of the knee was performed. No intravenous contrast was administered. COMPARISON:  Left knee radiographs dated April 28, 2023. FINDINGS: MENISCI Medial: Intact. Lateral: Intact. LIGAMENTS Cruciates: ACL and PCL are intact. Collaterals: Medial collateral ligament is intact. Lateral collateral ligament complex is intact. CARTILAGE Patellofemoral: Moderate to high-grade fissuring of the medial patellar facet articular cartilage. Medial: Mild partial-thickness cartilage loss of the medial femorotibial compartment. Lateral:  No chondral defect. JOINT: Moderate-sized joint effusion. Normal Hoffa's fat-pad. No plical thickening. POPLITEAL FOSSA: Popliteus tendon is intact. There is a small Baker's cyst with low signal intra-articular bodies measuring up to 9 mm. EXTENSOR MECHANISM: There is complete rupture of the distal left quadriceps tendon at the level of  the superior patellar pole insertion with intervening fluid gap with hematoma and approximately 2.2 cm of retraction. The patellar tendon is intact. BONES: No aggressive osseous lesion. Fragmented and retracted osteophyte from the superior patellar pole is not clearly visualized on this exam. No additional acute fracture identified. Other: Pronounced  subcutaneous edema anteriorly with hematoma. Moderate to severe edema of the vastus lateralis, rectus femoris, and vastus medialis muscles. IMPRESSION: 1. Complete rupture of the distal left quadriceps tendon at the level of the superior patellar pole insertion with retraction and associated edema/hematoma. 2. High grade 2 strains of the vastus lateralis, vastus medialis, and rectus femoris muscles. 3. Mild degenerative changes of the knee. 4. Moderate-sized joint effusion. Small Baker's cyst with loose bodies. Electronically Signed   By: Hart Robinsons M.D.   On: 04/29/2023 12:49   MR KNEE RIGHT WO CONTRAST Result Date: 04/29/2023 CLINICAL DATA:  Pain status post fall. EXAM: MRI OF THE RIGHT KNEE WITHOUT CONTRAST TECHNIQUE: Multiplanar, multisequence MR imaging of the knee was performed. No intravenous contrast was administered. COMPARISON:  CT of the right knee and right knee radiographs dated 04/28/2023. FINDINGS: MENISCI Medial: Complex tear at the posterior horn of the medial meniscus with extrusion into the medial gutter. A 5 x 5 mm low signal structure within the medial gutter, along the undersurface of the body of the medial meniscus, is favored to represent a displaced meniscal fragment. Lateral: Increased intrasubstance signal of the posterior horn of the lateral meniscus. LIGAMENTS Cruciates: The ACL is thickened with increased signal, most compatible with mucoid degeneration. PCL is intact. Collaterals: Medial collateral ligament is intact. Lateral collateral ligament complex is intact. CARTILAGE Patellofemoral: Suspected chondral delamination of the  medial patellar facet articular cartilage above the level of the equator measuring 6 x 5 mm with moderate chondral thinning of the medial patellar facet and patellar apex. Medial: Mild partial-thickness cartilage loss of the medial femorotibial compartment. Lateral:  No chondral defect. JOINT: Small to moderate-sized joint effusion. Normal Hoffa's fat-pad. POPLITEAL FOSSA: Popliteus tendon is intact. Tiny Baker's cyst. EXTENSOR MECHANISM: There is rupture of the distal quadriceps tendon, predominantly involving the vastus medialis component which is completely torn at its insertion on the superior patellar pole with at least approximately 2.5-3 cm of retraction. High-grade tearing of the distal vastus lateralis and rectus femoris components at and proximal to the superior patellar pole insertion. Patellar tendon is intact. BONES: No aggressive osseous lesion. No fracture or dislocation. Other: Pronounced subcutaneous edema anteriorly with hematoma. Moderate edema of the visualized distal vastus lateralis and medialis muscles. IMPRESSION: 1. Distal right quadriceps tendon rupture with complete retracted insertional tear of the distal vastus medialis component and high-grade tearing of the distal vastus lateralis and rectus femoris components. 2. Complex tear of the posterior horn of the medial meniscus with extrusion and suspected displaced meniscal fragment within the medial gutter. 3. Grade 2 strains of the vastus lateralis and medialis muscles. 4. Degeneration of the posterior horn of the lateral meniscus. 5. Mucoid degeneration of the ACL. 6. Mild degenerative changes of the knee with suspected chondral delamination of the medial patellar facet. 7. Small to moderate-sized joint effusion. Electronically Signed   By: Hart Robinsons M.D.   On: 04/29/2023 12:31   CT FEMUR RIGHT W CONTRAST Result Date: 04/28/2023 CLINICAL DATA:  Upper leg trauma R upper leg trauma r/o muscle hematoma. Fall EXAM: CT OF THE LOWER  RIGHT EXTREMITY WITH CONTRAST TECHNIQUE: Multidetector CT imaging of the lower right extremity was performed according to the standard protocol following intravenous contrast administration. RADIATION DOSE REDUCTION: This exam was performed according to the departmental dose-optimization program which includes automated exposure control, adjustment of the mA and/or kV according to patient size and/or use of iterative reconstruction technique. CONTRAST:  OMNIPAQUE IOHEXOL 300 MG/ML  SOLN  COMPARISON:  X-ray right knee 04/28/2023 FINDINGS: Bones/Joint/Cartilage No evidence of fracture or dislocation. Trace joint effusion. No evidence of severe arthropathy. No aggressive appearing focal bone abnormality. Ligaments Suboptimally assessed by CT. Muscles and Tendons Grossly unremarkable. No definite heterogeneity of the musculature to suggest underlying intramuscular hematoma. No left-sided to compare symmetry. Soft tissues Large volume anterior knee subcutaneus soft tissue hematoma formation. Atherosclerotic plaque of the femoral artery and popliteal artery. Vasectomy. IMPRESSION: 1. Large volume anterior knee subcutaneus soft tissue hematoma formation. 2.  Trace joint effusion. Electronically Signed   By: Tish Frederickson M.D.   On: 04/28/2023 20:18   DG Femur Min 2 Views Right Result Date: 04/28/2023 CLINICAL DATA:  Fall, thigh pain EXAM: RIGHT FEMUR 2 VIEWS COMPARISON:  Knee radiographs 04/28/2023 FINDINGS: Atheromatous vascular calcifications in the thigh. Knee effusion with fluid in the suprapatellar bursa. Prepatellar soft tissue swelling, cannot exclude prepatellar bursitis. Indistinct distal quadriceps tendon contour, cannot exclude distal quadriceps tendon injury. IMPRESSION: 1. Knee effusion with fluid in the suprapatellar bursa. 2. Prepatellar soft tissue swelling, cannot exclude prepatellar bursitis. 3. Indistinct distal quadriceps tendon contour, cannot exclude distal quadriceps tendon injury. 4.  Atheromatous vascular calcifications in the thigh. Electronically Signed   By: Gaylyn Rong M.D.   On: 04/28/2023 17:38   DG Knee Complete 4 Views Left Result Date: 04/28/2023 CLINICAL DATA:  Fall EXAM: LEFT KNEE - COMPLETE 4+ VIEW COMPARISON:  08/21/2021 FINDINGS: As on the prior exam, ossific structures posterior to the knee joint probably represent loose osteochondral fragments, possibly in a Baker's cyst. A previously fragmented osteophyte from the anterior superior patella appears proximally retracted by 3.5 cm, raising suspicion for quadriceps avulsion or rupture. Indistinct tissue planes around the distal quadriceps tendon. Suspected fluid in the suprapatellar bursa. No other fracture identified. IMPRESSION: 1. A previously fragmented osteophyte from the anterior superior patella appears proximally retracted by 3.5 cm, raising suspicion for quadriceps avulsion or rupture. 2. Suspected fluid in the suprapatellar bursa. 3. Ossific structures posterior to the knee joint probably represent loose osteochondral fragments, possibly in a Baker's cyst. Electronically Signed   By: Gaylyn Rong M.D.   On: 04/28/2023 17:36   DG Knee Complete 4 Views Right Result Date: 04/28/2023 CLINICAL DATA:  Status post fall.  Complains of right thigh pain. EXAM: RIGHT KNEE - COMPLETE 4+ VIEW COMPARISON:  None Available. FINDINGS: Small to moderate suprapatellar joint effusion identified. No signs of acute fracture or dislocation. Mild degenerative changes identified within the patellofemoral compartment. IMPRESSION: 1. Small to moderate suprapatellar joint effusion. 2. No acute fracture. 3. Mild patellofemoral osteoarthritis. Electronically Signed   By: Signa Kell M.D.   On: 04/28/2023 17:32    Labs: BNP (last 3 results) No results for input(s): "BNP" in the last 8760 hours. Basic Metabolic Panel: Recent Labs  Lab 04/29/23 0807 04/30/23 0544 04/30/23 1834 05/01/23 0324 05/02/23 0322 05/03/23 0315   NA 137 132*  --  134* 136 136  K 3.9 3.7  --  3.8 3.9 3.7  CL 104 101  --  103 104 103  CO2 24 24  --  23 24 23   GLUCOSE 109* 108*  --  144* 101* 112*  BUN 15 18  --  24* 18 17  CREATININE 0.74 0.89 1.12 1.04 0.90 0.80  CALCIUM 8.2* 8.0*  --  7.9* 7.9* 7.5*  MG 1.8 1.7  --   --   --  2.1  PHOS 2.4*  --   --   --   --  2.4*   Liver Function Tests: Recent Labs  Lab 04/28/23 1753 04/29/23 0807 05/03/23 0313  AST 53* 55* 56*  ALT 27 25 21   ALKPHOS 38 37* 34*  BILITOT 0.8 1.3* 1.0  PROT 6.3* 5.8* 5.6*  ALBUMIN 3.7 3.5 2.8*   No results for input(s): "LIPASE", "AMYLASE" in the last 168 hours. No results for input(s): "AMMONIA" in the last 168 hours. CBC: Recent Labs  Lab 04/28/23 1753 04/29/23 1053 04/30/23 0544 04/30/23 1834 05/01/23 0324 05/02/23 0322 05/03/23 0315  WBC 19.0*   < > 11.2* 13.2* 14.4* 16.2* 9.8  NEUTROABS 16.4*  --   --   --   --   --   --   HGB 16.7   < > 15.1 14.6 14.0 13.1 13.6  HCT 49.3   < > 46.0 44.1 42.2 39.5 41.5  MCV 93.0   < > 95.6 95.7 96.1 96.3 96.7  PLT 234   < > 208 197 214 226 252   < > = values in this interval not displayed.   Cardiac Enzymes: Recent Labs  Lab 04/29/23 0807 04/30/23 0544 05/01/23 0324 05/02/23 0322 05/03/23 0315  CKTOTAL 1,098* 1,202* 1,553* 1,545* 636*   Thyroid function studies Recent Labs    05/03/23 0313  TSH 5.875*   Anemia work up No results for input(s): "VITAMINB12", "FOLATE", "FERRITIN", "TIBC", "IRON", "RETICCTPCT" in the last 72 hours. Urinalysis    Component Value Date/Time   COLORURINE YELLOW 05/31/2022 1013   APPEARANCEUR CLEAR 05/31/2022 1013   LABSPEC 1.020 05/31/2022 1013   PHURINE 6.0 05/31/2022 1013   GLUCOSEU NEGATIVE 05/31/2022 1013   HGBUR NEGATIVE 05/31/2022 1013   BILIRUBINUR NEGATIVE 05/31/2022 1013   KETONESUR NEGATIVE 05/31/2022 1013   PROTEINUR NEGATIVE 03/13/2016 1648   UROBILINOGEN 0.2 05/31/2022 1013   NITRITE NEGATIVE 05/31/2022 1013   LEUKOCYTESUR NEGATIVE  05/31/2022 1013   Sepsis Labs Recent Labs  Lab 04/30/23 1834 05/01/23 0324 05/02/23 0322 05/03/23 0315  WBC 13.2* 14.4* 16.2* 9.8   Microbiology No results found for this or any previous visit (from the past 240 hours).   Time coordinating discharge: 35  minutes  SIGNED: Lanae Boast, MD  [facility] 05/03/2023, 1:28 PM  If 7PM-7AM, please contact night-coverage [email or web address]

## 2023-05-03 NOTE — Consult Note (Addendum)
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 atherosclerosis Hypertension. Hyperlipidemia. Minimal coronary artery calcification  Recommendations:  New onset of atrial fibrillation, postoperative state. Spontaneously converted to normal sinus rhythm. Based on telemetry he was likely in A-fib RVR for approximately 8 hours. Started on Lopressor 25 mg p.o. twice daily CHA2DS2-VASc SCORE is 4 We  discussed the risks, benefits, and alternatives to oral anticoagulation.  My concern is that his chads Vascor is high and given the fact that he is in the postoperative phase he may have intermittent episodes of A-fib.  He is not aware of when he goes in and out of A-fib as of now.  Given the duration of his paroxysmal A-fib recommend at least 4 weeks of anticoagulation. Orthopedic service (point of contact [facility]) has cleared him to be on  anticoagulation if needed. Primary team to determine which DOAC to utilize based on his insurance coverage. Long-term oral anticoagulation can be discussed at follow-up visit.  May consider cardiac monitor to evaluate for A-fib burden.  Patient also endorses that he has an apple iWatch which is able to do EKGs and he will start using it function. We also discussed considering sleep apnea evaluation as outpatient -the episode occurred while he was asleep. He does consume alcohol but no more than 1 beverage per day.  Status post mechanical fall with quadriceps injury/surgical intervention: Being followed by orthopedic service.  Aortic atherosclerosis Minimal coronary artery calcification Continue home atorvastatin 10 mg p.o. daily. Will hold off aspirin 81 mg p.o. daily as he is going to be on anticoagulation-this will minimize risk of bleeding May consider ischemic workup as outpatient based on his clinical trajectory Reemphasized the importance of secondary prevention with focus on improving her modifiable cardiovascular risk factors such as glycemic control, lipid management, blood pressure control, weight loss.  From a cardiology standpoint patient is stable to be transferred to CIR.  Outpatient follow-up appointment has been created.  Patient is agreeable with the plan of care and his questions and concerns were addressed to his satisfaction.  As part of this consultation recommendations and plan of care discussed with attending physician, patient, nursing staff.  We also reached out to orthopedic service as discussed above.  Further recommendations to follow as the case evolves.   This note was created using a voice recognition software as a result there may be grammatical errors inadvertently enclosed that do not reflect the nature of this encounter. Every attempt is made to correct such errors.   [name], DO, [facility]  [address] [place], [place]  [address] Pager: [phone] Office: [phone] 05/03/2023 12:54 PM

## 2023-05-04 ENCOUNTER — Inpatient Hospital Stay (HOSPITAL_COMMUNITY): Payer: BC Managed Care – PPO

## 2023-05-04 DIAGNOSIS — M7989 Other specified soft tissue disorders: Secondary | ICD-10-CM

## 2023-05-04 DIAGNOSIS — M79605 Pain in left leg: Secondary | ICD-10-CM

## 2023-05-04 DIAGNOSIS — I4891 Unspecified atrial fibrillation: Secondary | ICD-10-CM | POA: Diagnosis not present

## 2023-05-04 DIAGNOSIS — S76119D Strain of unspecified quadriceps muscle, fascia and tendon, subsequent encounter: Secondary | ICD-10-CM | POA: Diagnosis not present

## 2023-05-04 DIAGNOSIS — K5903 Drug induced constipation: Secondary | ICD-10-CM

## 2023-05-04 DIAGNOSIS — M79604 Pain in right leg: Secondary | ICD-10-CM

## 2023-05-04 LAB — COMPREHENSIVE METABOLIC PANEL
ALT: 30 U/L (ref 0–44)
AST: 68 U/L — ABNORMAL HIGH (ref 15–41)
Albumin: 2.8 g/dL — ABNORMAL LOW (ref 3.5–5.0)
Alkaline Phosphatase: 38 U/L (ref 38–126)
Anion gap: 10 (ref 5–15)
BUN: 18 mg/dL (ref 8–23)
CO2: 25 mmol/L (ref 22–32)
Calcium: 8.3 mg/dL — ABNORMAL LOW (ref 8.9–10.3)
Chloride: 98 mmol/L (ref 98–111)
Creatinine, Ser: 0.91 mg/dL (ref 0.61–1.24)
GFR, Estimated: >60 mL/min (ref >60)
Glucose, Bld: 80 mg/dL (ref 70–99)
Potassium: 4.2 mmol/L (ref 3.5–5.1)
Sodium: 133 mmol/L — ABNORMAL LOW (ref 135–145)
Total Bilirubin: 1.4 mg/dL — ABNORMAL HIGH (ref 0.0–1.2)
Total Protein: 4.9 g/dL — ABNORMAL LOW (ref 6.5–8.1)

## 2023-05-04 MED ORDER — FLEET ENEMA RE ENEM
1.0000 | ENEMA | Freq: Every day | RECTAL | Status: DC | PRN
  Administered 2023-05-04: 1 via RECTAL
  Filled 2023-05-04 (×2): qty 1

## 2023-05-04 MED ORDER — MAGNESIUM CITRATE PO SOLN
0.5000 | Freq: Once | ORAL | Status: AC
  Administered 2023-05-04: 0.5 via ORAL
  Filled 2023-05-04 (×2): qty 296

## 2023-05-04 MED ORDER — OXYCODONE HCL 5 MG PO TABS
10.0000 mg | ORAL_TABLET | ORAL | Status: DC | PRN
  Administered 2023-05-04 – 2023-05-06 (×9): 15 mg via ORAL
  Administered 2023-05-06: 10 mg via ORAL
  Administered 2023-05-06 – 2023-05-09 (×14): 15 mg via ORAL
  Filled 2023-05-04 (×26): qty 3

## 2023-05-04 MED ORDER — GABAPENTIN 100 MG PO CAPS
100.0000 mg | ORAL_CAPSULE | Freq: Three times a day (TID) | ORAL | Status: DC
  Administered 2023-05-04 – 2023-05-05 (×4): 100 mg via ORAL
  Filled 2023-05-04 (×4): qty 1

## 2023-05-04 NOTE — Progress Notes (Signed)
 Inpatient Rehabilitation Admission Medication Review by a Pharmacist  A complete drug regimen review was completed for this patient to identify any potential clinically significant medication issues.  High Risk Drug Classes Is patient taking? Indication by Medication  Antipsychotic No   Anticoagulant Yes Apixaban- NoAF / post-op Ortho  Antibiotic No   Opioid Yes OxyIR- acute pain  Antiplatelet No   Hypoglycemics/insulin No   Vasoactive Medication Yes Lopressor- HTN  Chemotherapy No   Other Yes Proscar- BPH Wellbutrin- MDD Protonix- GERD Robaxin- muscle spasms Ambien- sleep     Type of Medication Issue Identified Description of Issue Recommendation(s)  Drug Interaction(s) (clinically significant)     Duplicate Therapy     Allergy     No Medication Administration End Date     Incorrect Dose     Additional Drug Therapy Needed     Significant med changes from prior encounter (inform family/care partners about these prior to discharge).    Other  PTA meds: Testosterone cypionate 100 mg IM qweekly Diovan Restart PTA meds when and if necessary during CIR admission or at time of discharge, if warranted     Clinically significant medication issues were identified that warrant physician communication and completion of prescribed/recommended actions by midnight of the next day:  No    Time spent performing this drug regimen review (minutes):  30   Nicolae Vasek BS, PharmD, BCPS Clinical Pharmacist 05/04/2023 7:06 AM  Contact: [phone] after 3 PM  "Be curious, not judgmental..." -Debbora Dus
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
## 2023-05-04 NOTE — Progress Notes (Signed)
 PROGRESS NOTE   Subjective/Complaints: Patient reports pain has been very severe and his leg and knee.  Activity with therapy has been very painful.  Has occasional hiccups, not particularly bothersome at this time.  Has not had bowel movement in a few days.  ROS: Patient denies fever, new vision changes, dizziness, nausea, vomiting, diarrhea,  shortness of breath or chest pain, headache, or mood change.  + constipation +hiccups   Objective:   ECHOCARDIOGRAM COMPLETE Result Date: 05/03/2023    ECHOCARDIOGRAM REPORT   Patient Name:   Paul [name]. Date of Exam: 05/03/2023 Medical Rec #:  [id number]            Height:       72.0 in Accession #:    [id number]           Weight:       200.0 lb Date of Birth:  July 09, 1945            BSA:          2.131 m Patient Age:    78 years             BP:           139/71 mmHg Patient Gender: M                    HR:           73 bpm. Exam Location:  Inpatient Procedure: 2D Echo, Color Doppler and Cardiac Doppler (Both Spectral and Color            Flow Doppler were utilized during procedure). Indications:    Atrial Fibrillation I48.91  History:        Patient has prior history of Echocardiogram examinations, most                 recent 03/09/2019. COPD; Risk Factors:Hypertension and                 Dyslipidemia.  Sonographer:    Delton Coombes Referring Phys: [id number] RAMESH KC IMPRESSIONS  1. Left ventricular ejection fraction, by estimation, is 60 to 65%. The left ventricle has normal function. The left ventricle has no regional wall motion abnormalities. There is mild concentric left ventricular hypertrophy. Left ventricular diastolic parameters were normal.  2. Right ventricular systolic function is normal. The right ventricular size is mildly enlarged. There is normal pulmonary artery systolic pressure. The estimated right ventricular systolic pressure is 23.1 mmHg.  3. The mitral valve is grossly normal.  Trivial mitral valve regurgitation. No evidence of mitral stenosis.  4. The aortic valve is tricuspid. Aortic valve regurgitation is not visualized. No aortic stenosis is present.  5. The inferior vena cava is dilated in size with >50% respiratory variability, suggesting right atrial pressure of 8 mmHg. FINDINGS  Left Ventricle: Left ventricular ejection fraction, by estimation, is 60 to 65%. The left ventricle has normal function. The left ventricle has no regional wall motion abnormalities. Strain imaging was not performed. The left ventricular internal cavity  size was normal in size. There is mild concentric left ventricular hypertrophy. Left ventricular diastolic parameters were normal. Right Ventricle: The right  ventricular size is mildly enlarged. No increase in right ventricular wall thickness. Right ventricular systolic function is normal. There is normal pulmonary artery systolic pressure. The tricuspid regurgitant velocity is 1.94  m/s, and with an assumed right atrial pressure of 8 mmHg, the estimated right ventricular systolic pressure is 23.1 mmHg. Left Atrium: Left atrial size was normal in size. Right Atrium: Right atrial size was normal in size. Pericardium: There is no evidence of pericardial effusion. Mitral Valve: The mitral valve is grossly normal. Trivial mitral valve regurgitation. No evidence of mitral valve stenosis. Tricuspid Valve: The tricuspid valve is grossly normal. Tricuspid valve regurgitation is trivial. No evidence of tricuspid stenosis. Aortic Valve: The aortic valve is tricuspid. Aortic valve regurgitation is not visualized. No aortic stenosis is present. Pulmonic Valve: The pulmonic valve was grossly normal. Pulmonic valve regurgitation is not visualized. No evidence of pulmonic stenosis. Aorta: The aortic root and ascending aorta are structurally normal, with no evidence of dilitation. Venous: The inferior vena cava is dilated in size with greater than 50% respiratory  variability, suggesting right atrial pressure of 8 mmHg. IAS/Shunts: The atrial septum is grossly normal. Additional Comments: 3D imaging was not performed.  LEFT VENTRICLE PLAX 2D LVIDd:         4.40 cm   Diastology LVIDs:         2.80 cm   LV e' medial:    9.25 cm/s LV PW:         1.10 cm   LV E/e' medial:  9.2 LV IVS:        1.30 cm   LV e' lateral:   8.05 cm/s LVOT diam:     2.20 cm   LV E/e' lateral: 10.5 LV SV:         65 LV SV Index:   31 LVOT Area:     3.80 cm  RIGHT VENTRICLE             IVC RV Basal diam:  4.30 cm     IVC diam: 2.50 cm RV Mid diam:    3.40 cm RV S prime:     15.20 cm/s TAPSE (M-mode): 2.7 cm LEFT ATRIUM             Index        RIGHT ATRIUM           Index LA diam:        3.40 cm 1.60 cm/m   RA Area:     18.20 cm LA Vol (A2C):   23.1 ml 10.84 ml/m  RA Volume:   45.60 ml  21.40 ml/m LA Vol (A4C):   41.2 ml 19.34 ml/m LA Biplane Vol: 30.8 ml 14.46 ml/m  AORTIC VALVE LVOT Vmax:   87.50 cm/s LVOT Vmean:  59.400 cm/s LVOT VTI:    0.171 m  AORTA Ao Root diam: 3.30 cm Ao Asc diam:  3.70 cm MITRAL VALVE               TRICUSPID VALVE MV Area (PHT): 4.49 cm    TR Peak grad:   15.1 mmHg MV Decel Time: 169 msec    TR Vmax:        194.00 cm/s MV E velocity: 84.80 cm/s MV A velocity: 54.20 cm/s  SHUNTS MV E/A ratio:  1.56        Systemic VTI:  0.17 m  Systemic Diam: 2.20 cm Lennie Odor MD Electronically signed by Lennie Odor MD Signature Date/Time: 05/03/2023/11:15:07 AM    Final    Recent Labs    05/03/23 0315 05/03/23 1657  WBC 9.8 10.3  HGB 13.6 13.4  HCT 41.5 40.4  PLT 252 275   Recent Labs    05/03/23 0315 05/03/23 1657 05/04/23 0952  NA 136  --  133*  K 3.7  --  4.2  CL 103  --  98  CO2 23  --  25  GLUCOSE 112*  --  80  BUN 17  --  18  CREATININE 0.80 0.98 0.91  CALCIUM 7.5*  --  8.3*    Intake/Output Summary (Last 24 hours) at 05/04/2023 1544 Last data filed at 05/04/2023 1202 Gross per 24 hour  Intake 240 ml  Output 1725 ml  Net  -1485 ml        Physical Exam: Vital Signs Blood pressure 137/84, pulse 78, temperature 98.5 F (36.9 C), resp. rate 18, height 6' (1.829 m), weight 99.7 kg, SpO2 98%.  Physical Exam Constitutional:      General: He is not in acute distress.  Occasional hiccups    Appearance: Normal appearance.  HENT:     Head: Normocephalic and atraumatic.     Right Ear: External ear normal.     Left Ear: External ear normal.     Nose: Nose normal.     Mouth/Throat:     Mouth: Mucous membranes are moist.  Eyes:     Extraocular Movements: Extraocular movements intact.     Pupils: Pupils are equal, round, and reactive to light.  Cardiovascular:     Rate and Rhythm: Normal rate and regular rhythm.     Heart sounds: No murmur heard.    No gallop.  Pulmonary:     Effort: Pulmonary effort is normal. No respiratory distress.     Breath sounds: No wheezing.  Abdominal:     General: There is no distension.     Palpations: Abdomen is soft.     Tenderness: There is no abdominal tenderness.     Comments: BS present by a little hypoactive  Musculoskeletal:     Cervical back: No tenderness.     Comments: Bilateral knee immobilizers in place, knees swollen, tender to palpation  Skin:    Comments: Warm and dry  Neurological:     Mental Status: He is alert and oriented to person, place, and time.     Cranial Nerves: Cranial nerves 2-12 are intact.     Comments: Alert and oriented x 3. Normal insight and awareness. Intact Memory. Normal language and speech.  Nerves II through XII grossly intact. MMT: 5/5 BUE prox to distal. BLE limited by KI's, ortho.  sensory exam normal for light touch and pain in all 4 limbs.  Able to move both ankles.  No limb ataxia or cerebellar signs. No abnormal tone appreciated.  Marland Kitchen    Psychiatric:        Mood and Affect: Mood normal.        Behavior: Behavior normal.    Prior Exam: Right hip flexion 4 out of 5, left hip flexion 4- out of 5, ankle PF and DF 5 out of 5  bilaterally.  Assessment/Plan: 1. Functional deficits which require 3+ hours per day of interdisciplinary therapy in a comprehensive inpatient rehab setting. Physiatrist is providing close team supervision and 24 hour management of active medical problems listed below. Physiatrist and rehab team continue  to assess barriers to discharge/monitor patient progress toward functional and medical goals  Care Tool:  Bathing              Bathing assist       Upper Body Dressing/Undressing Upper body dressing        Upper body assist      Lower Body Dressing/Undressing Lower body dressing            Lower body assist       Toileting Toileting    Toileting assist       Transfers Chair/bed transfer  Transfers assist     Chair/bed transfer assist level: Minimal Assistance - Patient > 75%     Locomotion Ambulation   Ambulation assist   Ambulation activity did not occur: Safety/medical concerns          Walk 10 feet activity   Assist  Walk 10 feet activity did not occur: Safety/medical concerns        Walk 50 feet activity   Assist Walk 50 feet with 2 turns activity did not occur: Safety/medical concerns         Walk 150 feet activity   Assist Walk 150 feet activity did not occur: Safety/medical concerns         Walk 10 feet on uneven surface  activity   Assist Walk 10 feet on uneven surfaces activity did not occur: Safety/medical concerns         Wheelchair     Assist Is the patient using a wheelchair?: Yes Type of Wheelchair: Manual    Wheelchair assist level: Supervision/Verbal cueing Max wheelchair distance: 150    Wheelchair 50 feet with 2 turns activity    Assist        Assist Level: Supervision/Verbal cueing   Wheelchair 150 feet activity     Assist      Assist Level: Supervision/Verbal cueing   Blood pressure 137/84, pulse 78, temperature 98.5 F (36.9 C), resp. rate 18, height 6' (1.829 m),  weight 99.7 kg, SpO2 98%.   Medical Problem List and Plan: 1. Functional deficits secondary to bilateral quadricep tendon rupture with large subcutaneous hematoma.  Status post quadricep tendon repair 04/30/2023 ultrasound-guided aspiration of subcutaneous hematoma.  Bilateral knee immobilizers at all times nonweightbearing except okay to weightbearing as tolerated for pivot transfers.             -patient may shower             -ELOS/Goals: 7-10 days, supervision to min assist goals             -Continue CIR 2.  Antithrombotics: -DVT/anticoagulation: Eliquis initiated 05/03/2023             -antiplatelet therapy: N/A 3. Pain Management: Robaxin and oxycodone as needed  -2/22- increase oxycodone to 10 to 15 mg every 4 hours as needed.   4. Mood/Behavior/Sleep: Wellbutrin 150 mg daily.  Provide emotional support             -antipsychotic agents: N/A 5. Neuropsych/cognition: This patient is capable of making decisions on his own behalf. 6. Skin/Wound Care: Routine skin checks 7. Fluids/Electrolytes/Nutrition: Routine in and outs with follow-up chemistries 8.  New onset A-fib with RVR.  Patient responded to IV metoprolol transitioned to Lopressor 25 mg BID.   Eliquis initiated 05/03/2023.             -HR controlled and regular  -2/22 repeat EKG -remains in sinus rhythm 9.  Hypertension.  Monitor with increased mobility  -2/22 BP fair control, continue to monitor     05/04/2023    2:50 PM 05/04/2023    7:40 AM 05/04/2023    4:47 AM  Vitals with BMI  Systolic 137 146 440  Diastolic 84 79 72  Pulse 78 85 81    10.  BPH.  Proscar 5 mg daily.  Check PVR.  Denies difficulty voiding his bladder 11.  COPD with tobacco abuse.  Check oxygen saturations every shift.  Continue inhalers as directed.  Provide counseling 12.  Diastolic congestive heart failure.  Monitor for any signs of fluid overload.             -daily weights  Filed Weights   05/03/23 1619  Weight: 99.7 kg    13.  GERD.   Protonix 14.  Hyperlipidemia.  Mild rhabdo and lipitor held 15. Constipation              -LBM 2/17, order Sorbitol 45ml  -2/22 Mg citrate today, can try suppository if no results.  Will also add enema as needed severe constipation. 16.  Insomnia               -Restart Ambien 5 mg as needed-he takes this at home 17.  Hiccups  -Had Thorazine 25 mg as needed during acute hospitalization  -2/22 will start gabapentin 100 mg 3 times daily     LOS: 1 days A FACE TO FACE EVALUATION WAS PERFORMED  Fanny Dance 05/04/2023, 3:44 PM
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
## 2023-05-04 NOTE — Progress Notes (Signed)
   Patient seen in consult yesterday morning.  We were asked to follow up on his EKG this morning.  He remains in Sinus Rhythm.  Call us back if needed.   No charge.   Orenthal Debski, DO, FACC  10:35 AM

## 2023-05-04 NOTE — Progress Notes (Signed)
 Physical Therapy Session Note  Patient Details  Name: [name]. MRN: [id number] Date of Birth: 1945/07/25  Today's Date: 05/04/2023 PT Individual Time: 1002-1045 PT Individual Time Calculation (min): 43 min   Short Term Goals: Week 1:  PT Short Term Goal 1 (Week 1): STGs = LTGs  Skilled Therapeutic Interventions/Progress Updates:  Pt was seen bedside sitting up in w/c. Pt propelled w/c to gym with B UEs and S. Pt transferred sit to stand and stand pivot multiple times with contact guard to min A and verbal cues. Pt propelled w/c back to room with B UEs and S. Pt left sitting in w/c with all needs within reach.   Therapy Documentation Precautions:  Precautions Precautions: Knee Precaution/Restrictions Comments: B knee immobilizers Required Braces or Orthoses: Knee Immobilizer - Right, Knee Immobilizer - Left Knee Immobilizer - Right: On at all times Knee Immobilizer - Left: On at all times Restrictions Weight Bearing Restrictions Per Provider Order: Yes RLE Weight Bearing Per Provider Order: Non weight bearing LLE Weight Bearing Per Provider Order: Non weight bearing Other Position/Activity Restrictions: Per orders : ok to weightbear for STPs only; per secure chat with Meghan, ortho PA, pt may do quad sets and SLR General:  Pain: Pt c/o pain 5/10 L knee.  Therapy/Group: Individual Therapy  Rayford Halsted 05/04/2023, 12:55 PM

## 2023-05-04 NOTE — Plan of Care (Signed)
  Problem: RH Bathing Goal: LTG Patient will bathe all body parts with assist levels (OT) Description: LTG: Patient will bathe all body parts with assist levels (OT) Flowsheets (Taken 05/04/2023 1623) LTG: Pt will perform bathing with assistance level/cueing: Independent with assistive device  LTG: Position pt will perform bathing: At sink   Problem: RH Dressing Goal: LTG Patient will perform lower body dressing w/assist (OT) Description: LTG: Patient will perform lower body dressing with assist, with/without cues in positioning using equipment (OT) Flowsheets (Taken 05/04/2023 1623) LTG: Pt will perform lower body dressing with assistance level of: Set up assist   Problem: RH Toileting Goal: LTG Patient will perform toileting task (3/3 steps) with assistance level (OT) Description: LTG: Patient will perform toileting task (3/3 steps) with assistance level (OT)  Flowsheets (Taken 05/04/2023 1623) LTG: Pt will perform toileting task (3/3 steps) with assistance level: Set up assist   Problem: RH Toilet Transfers Goal: LTG Patient will perform toilet transfers w/assist (OT) Description: LTG: Patient will perform toilet transfers with assist, with/without cues using equipment (OT) Flowsheets (Taken 05/04/2023 1623) LTG: Pt will perform toilet transfers with assistance level of: Supervision/Verbal cueing   Problem: RH Tub/Shower Transfers Goal: LTG Patient will perform tub/shower transfers w/assist (OT) Description: LTG: Patient will perform tub/shower transfers with assist, with/without cues using equipment (OT) Flowsheets (Taken 05/04/2023 1623) LTG: Pt will perform tub/shower stall transfers with assistance level of: Supervision/Verbal cueing LTG: Pt will perform tub/shower transfers from: Walk in shower

## 2023-05-04 NOTE — Progress Notes (Signed)
 Patient had large bowel movement after prn fleets administered this evening.

## 2023-05-04 NOTE — Progress Notes (Signed)
 Bilateral lower extremity venous duplex has been completed. Preliminary results can be found in CV Proc through chart review.   05/04/23 3:44 PM Olen Cordial RVT

## 2023-05-05 DIAGNOSIS — M79604 Pain in right leg: Secondary | ICD-10-CM | POA: Diagnosis not present

## 2023-05-05 DIAGNOSIS — R066 Hiccough: Secondary | ICD-10-CM

## 2023-05-05 DIAGNOSIS — S76119D Strain of unspecified quadriceps muscle, fascia and tendon, subsequent encounter: Secondary | ICD-10-CM | POA: Diagnosis not present

## 2023-05-05 DIAGNOSIS — I4891 Unspecified atrial fibrillation: Secondary | ICD-10-CM | POA: Diagnosis not present

## 2023-05-05 DIAGNOSIS — K5903 Drug induced constipation: Secondary | ICD-10-CM | POA: Diagnosis not present

## 2023-05-05 MED ORDER — POLYETHYLENE GLYCOL 3350 17 G PO PACK
17.0000 g | PACK | Freq: Every day | ORAL | Status: DC
  Administered 2023-05-07: 17 g via ORAL
  Filled 2023-05-05 (×3): qty 1

## 2023-05-05 MED ORDER — SENNOSIDES-DOCUSATE SODIUM 8.6-50 MG PO TABS
2.0000 | ORAL_TABLET | Freq: Every day | ORAL | Status: DC
  Administered 2023-05-05 – 2023-05-08 (×4): 2 via ORAL
  Filled 2023-05-05 (×4): qty 2

## 2023-05-05 NOTE — Discharge Instructions (Signed)
 Inpatient Rehab Discharge Instructions  Paul [name]. Discharge date and time: No discharge date for patient encounter.   Activities/Precautions/ Functional Status: Activity: Nonweightbearing bilateral lower extremity except weightbearing as tolerated for squat pivot transfers.  Knee immobilizers at all times Diet: Regular Wound Care: Routine skin checks Functional status:  ___ No restrictions     ___ Walk up steps independently ___ 24/7 supervision/assistance   ___ Walk up steps with assistance ___ Intermittent supervision/assistance  ___ Bathe/dress independently ___ Walk with walker     _x__ Bathe/dress with assistance ___ Walk Independently    ___ Shower independently ___ Walk with assistance    ___ Shower with assistance ___ No alcohol     ___ Return to work/school ________  Special Instructions:  No driving smoking or alcohol  My questions have been answered and I understand these instructions. I will adhere to these goals and the provided educational materials after my discharge from the hospital.  Patient/Caregiver Signature _______________________________ Date __________  Clinician Signature _______________________________________ Date __________  Please bring this form and your medication list with you to all your follow-up doctor's appointments.

## 2023-05-05 NOTE — Discharge Summary (Signed)
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 without any extra sounds or murmur heard Abdomen.  Soft nontender positive bowel sounds without rebound Respiratory effort normal no respiratory distress without wheeze Skin.  Warm and dry Musculoskeletal.  Bilateral knee immobilizers in place Neurologic.  Alert and oriented x 3.  Manual muscle testing 5/5 bilateral upper extremities proximal to distal bilateral  lower extremities limited by KI.  Right hip flexion 4 out of 5 left hip flexion 4 out of 5 ankle plantar dorsiflexion 5 out of 5.  He/She  has had improvement in activity tolerance, balance, postural control as well as ability to compensate for deficits. He/She has had improvement in functional use RUE/LUE  and RLE/LLE as well as improvement in awareness.  Supervision to propel wheelchair.  Stand pivot transfer wheelchair to bed supervision.  Sit to supine with supervision.  Upper body ADLs sink side sponge bathing with distant supervision.  Lower body contact-guard for standing PERI care and management of shorts.  Full family teaching completed plan  discharge to home       Disposition: 06 Home with home health    Diet: Regular  Special Instructions: No driving smoking or alcohol  Bilateral knee Bledsoe braces at all times.  Nonweightbearing bilateral extremities weightbearing as tolerated for pivot transfers only  Medications at discharge. 1.  Tylenol as needed 2.  Eliquis 5 mg p.o. twice daily 3.  Wellbutrin XL 150 mg p.o. daily 4.  Ambien 10 mg nightly as needed 5.  Proscar 5 mg p.o. daily 6.  Neurontin 300 mg p.o. 3 times daily 7.  Robaxin 500 mg every 6 hours as needed muscle spasms 8.  Lopressor 25 mg p.o. twice daily 9.  Dulera 100-5 mcg 2 puffs twice daily 10.  Oxycodone 10 to 15 mg mg every 4 hours as needed pain 11.  Protonix 40 mg p.o. daily 12.  Magnesium oxide 400 mg daily 13.  MiraLAX twice daily hold for loose stools 14.  Senokot S2 tablets nightly 15.  Vitamin D 1000 units daily 16.  Multivitamin daily 17.  Depo testosterone 0.5 mL 1 time a week 18.  Ventolin inhaler 2 puffs every 6 hours as needed 19.  Symbicort 80-4.5 mcg 2 puffs into the lungs twice daily 20.  Baclofen 5 mg 3 times daily as needed hiccups 21.  Keflex 250 mg every 6 hours completing course 22.  Robaxin 500 mg every 6 hours as needed muscle spasms 23.  Narcan 4 mg use as needed in case of overdose 24.  Testosterone injection 200 mg in the muscle 1 time a week 25.  Ambien 10 mg nightly as needed   30-35 minutes were spent completing discharge summary and discharge planning     Follow-up Information     Fanny Dance, MD Follow up.   Specialty: Physical Medicine and Rehabilitation Why: No formal follow-up needed Contact information: [address] [place] [address] [phone]         Sheral Apley, MD Follow up.   Specialty: Orthopedic Surgery Why: Call for appointment Contact information: [address] [place] [address] [phone]         [name], DO Follow up.   Specialties: Cardiology, Vascular Surgery Why: Call for appointment Contact information: [address] [place] [address] [phone]                 Signed: Charlton Amor 05/05/2023, 3:02 PM

## 2023-05-05 NOTE — Progress Notes (Signed)
 Occupational Therapy Session Note  Patient Details  Name: Paul [name]. MRN: [id number] Date of Birth: 1945-08-03  Today's Date: 05/05/2023 OT Individual Time: 1050-1200 OT Individual Time Calculation (min): 70 min    Short Term Goals: Week 1:     Skilled Therapeutic Interventions/Progress Updates:    Patient in bed at the time of arrival indicating that he rested well last night. Patient report a pain response of 6 in association with bilateral knees. The pt also state that he has had the hiccups since Tuesday of last week. The pt presents with a willingness to participate. The pt was able to come from supine to EOB with SBA, he was able to transfer from EOB to the w/c by incorporating the RW and doing a stand pivot transfer. The pt went on to complete UB exercises using a 2lb dowel 2 sets of 10 for shld flexion, horizontal abduction, shld rotation and lg circles with rest breaks as needed. The pt required 1 rest break with each exercise for incorporating relaxation  breathing .  The pt went on to complete zoom ball in sit 3x with 2 rest breaks. The pt went on to complete UB exercises using red theraband for shld flexion, chest press, elbow extension with rest breaks as needed.  The pt required 1 rest break with each exercise.  The pt remained at w/c LOFwith BLE supported. The call light and bedside table within reach and all additional needs addressed.  Therapy Documentation Precautions:  Precautions Precautions: Knee Precaution/Restrictions Comments: B knee immobilizers Required Braces or Orthoses: Knee Immobilizer - Right, Knee Immobilizer - Left Knee Immobilizer - Right: On at all times Knee Immobilizer - Left: On at all times Restrictions Weight Bearing Restrictions Per Provider Order: No RLE Weight Bearing Per Provider Order: Weight bearing as tolerated (with sp tranfer only) LLE Weight Bearing Per Provider Order: Weight bearing as tolerated (with sp transfer only) Other  Position/Activity Restrictions: Per orders : ok to weightbear for STPs only; per secure chat with Meghan, ortho PA, pt may do quad sets and SLR   Therapy/Group: Individual Therapy  Lavona Mound 05/05/2023, 12:29 PM

## 2023-05-05 NOTE — Progress Notes (Signed)
 PROGRESS NOTE   Subjective/Complaints: Patient reports knee pain is better controlled with oxycodone increased.  Hiccups are better today, potentially since starting gabapentin.  He had bowel movements after enema yesterday-feels better.  ROS: Patient denies fever, new vision changes, dizziness, nausea, vomiting, diarrhea,  shortness of breath or chest pain, headache, or mood change.  + constipation-improved after enema +hiccups-improved   Objective:   VAS [place] LOWER EXTREMITY VENOUS (DVT) Result Date: 05/04/2023  Lower Venous DVT Study Patient Name:  Paul [name].  Date of Exam:   05/04/2023 Medical Rec #: [id number]             Accession #:    [id number] Date of Birth: Sep 09, 1945             Patient Gender: M Patient Age:   78 years Exam Location:  [facility] Procedure:      VAS [place] LOWER EXTREMITY VENOUS (DVT) Referring Phys: Mariam Dollar --------------------------------------------------------------------------------  Indications: Swelling.  Risk Factors: None identified. Limitations: Poor ultrasound/tissue interface. Comparison Study: No prior studies. Performing Technologist: Chanda Busing RVT  Examination Guidelines: A complete evaluation includes B-mode imaging, spectral Doppler, color Doppler, and power Doppler as needed of all accessible portions of each vessel. Bilateral testing is considered an integral part of a complete examination. Limited examinations for reoccurring indications may be performed as noted. The reflux portion of the exam is performed with the patient in reverse Trendelenburg.  +---------+---------------+---------+-----------+----------+--------------+ RIGHT    CompressibilityPhasicitySpontaneityPropertiesThrombus Aging +---------+---------------+---------+-----------+----------+--------------+ CFV      Full           Yes      Yes                                  +---------+---------------+---------+-----------+----------+--------------+ SFJ      Full                                                        +---------+---------------+---------+-----------+----------+--------------+ FV Prox  Full                                                        +---------+---------------+---------+-----------+----------+--------------+ FV Mid                  Yes      Yes                                 +---------+---------------+---------+-----------+----------+--------------+ FV Distal               Yes      Yes                                 +---------+---------------+---------+-----------+----------+--------------+  PFV      Full                                                        +---------+---------------+---------+-----------+----------+--------------+ POP      Full           Yes      Yes                                 +---------+---------------+---------+-----------+----------+--------------+ PTV      Full                                                        +---------+---------------+---------+-----------+----------+--------------+ PERO     Full                                                        +---------+---------------+---------+-----------+----------+--------------+   +---------+---------------+---------+-----------+----------+--------------+ LEFT     CompressibilityPhasicitySpontaneityPropertiesThrombus Aging +---------+---------------+---------+-----------+----------+--------------+ CFV      Full           Yes      Yes                                 +---------+---------------+---------+-----------+----------+--------------+ SFJ      Full                                                        +---------+---------------+---------+-----------+----------+--------------+ FV Prox  Full                                                         +---------+---------------+---------+-----------+----------+--------------+ FV Mid   Full                                                        +---------+---------------+---------+-----------+----------+--------------+ FV Distal               Yes      Yes                                 +---------+---------------+---------+-----------+----------+--------------+ PFV      Full                                                        +---------+---------------+---------+-----------+----------+--------------+  POP      Full           Yes      Yes                                 +---------+---------------+---------+-----------+----------+--------------+ PTV      Full                                                        +---------+---------------+---------+-----------+----------+--------------+ PERO     Full                                                        +---------+---------------+---------+-----------+----------+--------------+     Summary: RIGHT: - There is no evidence of deep vein thrombosis in the lower extremity. However, portions of this examination were limited- see technologist comments above.  - No cystic structure found in the popliteal fossa.  LEFT: - There is no evidence of deep vein thrombosis in the lower extremity. However, portions of this examination were limited- see technologist comments above.  - No cystic structure found in the popliteal fossa.  *See table(s) above for measurements and observations. Electronically signed by Sherald Hess MD on 05/04/2023 at 4:52:20 PM.    Final    Recent Labs    05/03/23 0315 05/03/23 1657  WBC 9.8 10.3  HGB 13.6 13.4  HCT 41.5 40.4  PLT 252 275   Recent Labs    05/03/23 0315 05/03/23 1657 05/04/23 0952  NA 136  --  133*  K 3.7  --  4.2  CL 103  --  98  CO2 23  --  25  GLUCOSE 112*  --  80  BUN 17  --  18  CREATININE 0.80 0.98 0.91  CALCIUM 7.5*  --  8.3*    Intake/Output Summary (Last 24  hours) at 05/05/2023 1551 Last data filed at 05/05/2023 1400 Gross per 24 hour  Intake 477 ml  Output 2725 ml  Net -2248 ml        Physical Exam: Vital Signs Blood pressure 120/69, pulse 70, temperature 97.9 F (36.6 C), temperature source Oral, resp. rate 17, height 6' (1.829 m), weight 96.6 kg, SpO2 99%.  Physical Exam Constitutional:      General: He is not in acute distress.  No hiccups noted today well.    Appearance: Normal appearance.  HENT:     Head: Normocephalic and atraumatic.     Right Ear: External ear normal.     Left Ear: External ear normal.     Nose: Nose normal.     Mouth/Throat:     Mouth: Mucous membranes are moist.  Eyes:     Extraocular Movements: Extraocular movements intact.     Pupils: Pupils are equal, round, and reactive to light.  Cardiovascular:     Rate and Rhythm: Normal rate and regular rhythm.     Heart sounds: No murmur heard.    No gallop.  Pulmonary:     Effort: Pulmonary effort is normal. No respiratory distress.     Breath sounds: No wheezing.  Abdominal:  General: There is no distension.     Palpations: Abdomen is soft.     Tenderness: There is no abdominal tenderness.     Comments: BS normoactive today -more hypoactive yesterday  musculoskeletal:     Cervical back: No tenderness.     Comments: Bilateral knee immobilizers in place, knees swollen, tender to palpation  Skin:    Comments: Warm and dry  Neurological:     Mental Status: He is alert and oriented to person, place, and time.     Cranial Nerves: Cranial nerves 2-12 are intact.     Comments: Alert and oriented x 3. Normal insight and awareness. Intact Memory. Normal language and speech.  Nerves II through XII grossly intact. MMT: 5/5 BUE prox to distal. BLE limited by KI's, ortho.  sensory exam normal for light touch and pain in all 4 limbs.  Able to move both ankles.  No limb ataxia or cerebellar signs. No abnormal tone appreciated.  Marland Kitchen    Psychiatric:        Mood and  Affect: Mood normal.        Behavior: Behavior normal.    Prior Exam: Right hip flexion 4 out of 5, left hip flexion 4- out of 5, ankle PF and DF 5 out of 5 bilaterally.  Assessment/Plan: 1. Functional deficits which require 3+ hours per day of interdisciplinary therapy in a comprehensive inpatient rehab setting. Physiatrist is providing close team supervision and 24 hour management of active medical problems listed below. Physiatrist and rehab team continue to assess barriers to discharge/monitor patient progress toward functional and medical goals  Care Tool:  Bathing    Body parts bathed by patient: Right arm, Left arm, Left upper leg, Face, Chest, Abdomen, Front perineal area, Right upper leg   Body parts bathed by helper: Buttocks Body parts n/a: Right lower leg, Left lower leg   Bathing assist Assist Level: Minimal Assistance - Patient > 75%     Upper Body Dressing/Undressing Upper body dressing   What is the patient wearing?: Hospital gown only    Upper body assist Assist Level: Set up assist    Lower Body Dressing/Undressing Lower body dressing      What is the patient wearing?: Underwear/pull up     Lower body assist Assist for lower body dressing: Minimal Assistance - Patient > 75%     Toileting Toileting    Toileting assist Assist for toileting: Maximal Assistance - Patient 25 - 49%     Transfers Chair/bed transfer  Transfers assist     Chair/bed transfer assist level: Minimal Assistance - Patient > 75%     Locomotion Ambulation   Ambulation assist   Ambulation activity did not occur: Safety/medical concerns          Walk 10 feet activity   Assist  Walk 10 feet activity did not occur: Safety/medical concerns        Walk 50 feet activity   Assist Walk 50 feet with 2 turns activity did not occur: Safety/medical concerns         Walk 150 feet activity   Assist Walk 150 feet activity did not occur: Safety/medical  concerns         Walk 10 feet on uneven surface  activity   Assist Walk 10 feet on uneven surfaces activity did not occur: Safety/medical concerns         Wheelchair     Assist Is the patient using a wheelchair?: Yes Type of Wheelchair: Manual  Wheelchair assist level: Supervision/Verbal cueing Max wheelchair distance: 150    Wheelchair 50 feet with 2 turns activity    Assist        Assist Level: Supervision/Verbal cueing   Wheelchair 150 feet activity     Assist      Assist Level: Supervision/Verbal cueing   Blood pressure 120/69, pulse 70, temperature 97.9 F (36.6 C), temperature source Oral, resp. rate 17, height 6' (1.829 m), weight 96.6 kg, SpO2 99%.   Medical Problem List and Plan: 1. Functional deficits secondary to bilateral quadricep tendon rupture with large subcutaneous hematoma.  Status post quadricep tendon repair 04/30/2023 ultrasound-guided aspiration of subcutaneous hematoma.  Bilateral knee immobilizers at all times nonweightbearing except okay to weightbearing as tolerated for pivot transfers.             -patient may shower             -ELOS/Goals: 7-10 days, supervision to min assist goals             -Continue CIR 2.  Antithrombotics: -DVT/anticoagulation: Eliquis initiated 05/03/2023             -antiplatelet therapy: N/A 3. Pain Management: Robaxin and oxycodone as needed  -2/22- increase oxycodone to 10 to 15 mg every 4 hours as needed.   4. Mood/Behavior/Sleep: Wellbutrin 150 mg daily.  Provide emotional support             -antipsychotic agents: N/A 5. Neuropsych/cognition: This patient is capable of making decisions on his own behalf. 6. Skin/Wound Care: Routine skin checks 7. Fluids/Electrolytes/Nutrition: Routine in and outs with follow-up chemistries 8.  New onset A-fib with RVR.  Patient responded to IV metoprolol transitioned to Lopressor 25 mg BID.   Eliquis initiated 05/03/2023.             -HR controlled and  regular  -2/22 repeat EKG -remains in sinus rhythm  -2/23 heart rate sounds regular today 9.  Hypertension.   Monitor with increased mobility  -2/23 BP controlled, continue to monitor     05/05/2023   12:00 PM 05/05/2023    8:02 AM 05/05/2023    4:57 AM  Vitals with BMI  Weight   212 lbs 15 oz  BMI   28.88  Systolic 120 121   Diastolic 69 84   Pulse 70 72     10.  BPH.  Proscar 5 mg daily.  Check PVR.  Denies difficulty voiding his bladder 11.  COPD with tobacco abuse.  Check oxygen saturations every shift.  Continue inhalers as directed.  Provide counseling 12.  Diastolic congestive heart failure.  Monitor for any signs of fluid overload.             -daily weights-stable  Filed Weights   05/03/23 1619 05/05/23 0457  Weight: 99.7 kg 96.6 kg    13.  GERD.  Protonix 14.  Hyperlipidemia.  Mild rhabdo and lipitor held 15. Constipation              -LBM 2/17, order Sorbitol 45ml  -2/22 Mg citrate today, can try suppository if no results.  Will also add enema as needed severe constipation.  -2/23 BM yesterday after enema. daily MiraLAX and Senokot help prevent opioid related constipation 16.  Insomnia               -Restart Ambien 5 mg as needed-he takes this at home 17.  Hiccups  -Had Thorazine 25 mg as needed during acute hospitalization  -2/22  will start gabapentin 100 mg 3 times daily  -12/23 hiccups improved, could consider increasing gabapentin dose hiccups continue  Addendum- continued intermittent hiccups- increase dose gabapentin to 200mg  TID     LOS: 2 days A FACE TO FACE EVALUATION WAS PERFORMED  Fanny Dance 05/05/2023, 3:51 PM

## 2023-05-05 NOTE — Plan of Care (Signed)
  Problem: Consults Goal: RH GENERAL PATIENT EDUCATION Description: See Patient Education module for education specifics. Outcome: Progressing   Problem: RH BOWEL ELIMINATION Goal: RH STG MANAGE BOWEL WITH ASSISTANCE Description: STG Manage Bowel with toileting Assistance. Outcome: Progressing Goal: RH STG MANAGE BOWEL W/MEDICATION W/ASSISTANCE Description: STG Manage Bowel with Medication with mod I Assistance. Outcome: Progressing   Problem: RH BLADDER ELIMINATION Goal: RH STG MANAGE BLADDER WITH ASSISTANCE Description: STG Manage Bladder With toileting Assistance Outcome: Progressing Goal: RH STG MANAGE BLADDER WITH MEDICATION WITH ASSISTANCE Description: STG Manage Bladder With Medication With mod I Assistance. Outcome: Progressing   Problem: RH SKIN INTEGRITY Goal: RH STG ABLE TO PERFORM INCISION/WOUND CARE W/ASSISTANCE Description: STG Able To Perform Incision/Wound Care With min  Assistance. Outcome: Progressing   Problem: RH SAFETY Goal: RH STG ADHERE TO SAFETY PRECAUTIONS W/ASSISTANCE/DEVICE Description: STG Adhere to Safety Precautions With cues Assistance/Device. Outcome: Progressing   Problem: RH PAIN MANAGEMENT Goal: RH STG PAIN MANAGED AT OR BELOW PT'S PAIN GOAL Description: Pain < 4 with prns Outcome: Progressing   Problem: RH KNOWLEDGE DEFICIT GENERAL Goal: RH STG INCREASE KNOWLEDGE OF SELF CARE AFTER HOSPITALIZATION Description: Patient and wife will be able to manage care at discharge using educational resources for medications, skin care, dietary modification, etc. independently Outcome: Progressing

## 2023-05-05 NOTE — Progress Notes (Incomplete)
 Occupational Therapy Session Note  Patient Details  Name: Paul [name]. MRN: [id number] Date of Birth: [birth year]  {CHL IP REHAB OT TIME CALCULATIONS:304400400}  {CHL IP REHAB OT TIME CALCULATIONS:304400400}  Short Term Goals: Week 1:  OT Short Term Goal 1 (Week 1): STGs=LTGs due to patient's estimated length of stay.  Skilled Therapeutic Interventions/Progress Updates:   Session 1: Pt greeted *** for skilled OT session with focus on ***.   Pain: Pt reported ***/10 pain, stating "***" in reference to ***. OT offering intermediate rest breaks and positioning suggestions throughout session to address pain/fatigue and maximize participation/safety in session.   Functional Transfers:  Self Care Tasks: Pt completes the following self care tasks with levels of assistance noted below, UB: LB:   Therapeutic Activities:  Therapeutic Exercise:   Education:  Pt remained *** with 4Ps assessed and immediate needs met. Pt continues to be appropriate for skilled OT intervention to promote further functional independence in ADLs/IADLs.    Session 2:  Pt greeted *** for skilled OT session with focus on ***.   Pain: Pt reported ***/10 pain, stating "***" in reference to ***. OT offering intermediate rest breaks and positioning suggestions throughout session to address pain/fatigue and maximize participation/safety in session.   Functional Transfers:  Self Care Tasks: Pt completes the following self care tasks with levels of assistance noted below, UB: LB:   Therapeutic Activities:  Therapeutic Exercise:   Education:  Pt remained *** with 4Ps assessed and immediate needs met. Pt continues to be appropriate for skilled OT intervention to promote further functional independence in ADLs/IADLs.    Therapy Documentation Precautions:  Precautions Precautions: Knee Precaution/Restrictions Comments: B knee immobilizers Required Braces or Orthoses: Knee Immobilizer - Right, Knee  Immobilizer - Left Knee Immobilizer - Right: On at all times Knee Immobilizer - Left: On at all times Restrictions Weight Bearing Restrictions Per Provider Order: No RLE Weight Bearing Per Provider Order: Weight bearing as tolerated (with sp tranfer only) LLE Weight Bearing Per Provider Order: Weight bearing as tolerated (with sp transfer only) Other Position/Activity Restrictions: Per orders : ok to weightbear for STPs only; per secure chat with Meghan, ortho PA, pt may do quad sets and SLR  Therapy/Group: Individual Therapy  Lou Cal, OTR/L, MSOT  05/05/2023, 9:18 PM

## 2023-05-05 NOTE — Progress Notes (Signed)
 Physical Therapy Session Note  Patient Details  Name: Paul [name]. MRN: [id number] Date of Birth: Aug 26, 1945  Today's Date: 05/05/2023 PT Individual Time: 9562-1308, 6578-4696 PT Individual Time Calculation (min): 77 min, 42 min   Short Term Goals: Week 1:  PT Short Term Goal 1 (Week 1): STGs = LTGs  Skilled Therapeutic Interventions/Progress Updates:      Treatment Session 1  Pt supine in bed upon arrival. Pt agreeable to therapy. Pt reports 5/10 pain in B knee. Pt requesting to wait until after session to take pain medicine. Therapist offered rest breaks and repositioning as needed.   Pt able to recall precautions.   Pt performed the following therex for B LE strengthening/ROM/contracture prevention, education provided for breathing in through the nose and out through the mouth. Handout provided for these exercises, recommended pt complete at least once a day.   Quad sets 1x10 holding for 5 seconds Glute sets 1x10 holding for 5 seconds 2x5 B SLR, active assisted on L LE-first set with therapist providing assist, 2nd set with use gait belt.  Ankle pumps 1x15   Discussed home set up and DME: home measurement handout provided, and handout provided for how to build a ramp. Education provided for retning ramps from [employer], recommended pt measure height of stairs and ensure for every inch of rise he has a foot of run for safety.   DME: pt will also need a WC and RW.   Pt performed stand pivot transfer with RW and min A progressing to CGA for power up, and CGA for pivot, from elevated bed, and WC, pt demos improved ease with B UE on arm rest of WC.   Therapist switched pt 22x18 WC to 18x18 inch WC for improved fit, and positioning. Pt self propelled WC 150+ feet with supervision, intermittent verbal cues provided for efficiency with UE propulsion.   Education provided for donning and doffing leg rests, pt donned and doffed with supervision, with use of gait  belt to lift L LE onto L leg rest. Pt requires intermittent verbal cues for locking brakes prior to standing.   Sit to supine with supervision with bed flat and use of bed rail and gait belt to lift L LE.   Pt supine in bed at end of session with all needs within reach and bed alarm on.   Treatment Session 2   Pt seated in WC upon arrival. Pt agreeable to therapy. Pt reports 4/10 B knee pain, pt refusing medication at this time. Therapist provided rest breaks and repositioning throughout session as needed. Pt wife present throughout session.   Pt self propelled WC room to ortho gym with B UE  and supervision, verbal cues provided for UE positioning for improved efficiency with navigating turns.   Pt performed stand pivot transfer WC to car simulator at height of SUV with CGA, pt required min A to lift B LE for posterior scoot onto seat, verbal cues provided for technique and UE positioning. Education provided for 2 options for car entry: posterior scoot in back seat, or scooting passenger seat back to furthest position to allow B knee extension. Pt reports preference for passenger seat option. Discussed with pt and wife trialing in their actual car. Plan to practice Tuesday at 1:00. Notified scheduler.   Pt donned and doffed leg rests with supervision-min A, with use of gait belt to lift B LE, education provided not to lift straps of knee immobilizer as pt is pulling  straps loose.   Pt supine in bed at end of session with all needs within reach and bed alarm on.       Therapy Documentation Precautions:  Precautions Precautions: Knee Precaution/Restrictions Comments: B knee immobilizers Required Braces or Orthoses: Knee Immobilizer - Right, Knee Immobilizer - Left Knee Immobilizer - Right: On at all times Knee Immobilizer - Left: On at all times Restrictions Weight Bearing Restrictions Per Provider Order: No RLE Weight Bearing Per Provider Order: Weight bearing as tolerated (with sp  tranfer only) LLE Weight Bearing Per Provider Order: Weight bearing as tolerated (with sp transfer only) Other Position/Activity Restrictions: Per orders : ok to weightbear for STPs only; per secure chat with Meghan, ortho PA, pt may do quad sets and SLR  Therapy/Group: Individual Therapy  [facility] Ambrose Finland, [place], DPT  05/05/2023, 8:54 AM

## 2023-05-06 DIAGNOSIS — I4891 Unspecified atrial fibrillation: Secondary | ICD-10-CM | POA: Diagnosis not present

## 2023-05-06 DIAGNOSIS — K5901 Slow transit constipation: Secondary | ICD-10-CM | POA: Diagnosis not present

## 2023-05-06 DIAGNOSIS — S76119D Strain of unspecified quadriceps muscle, fascia and tendon, subsequent encounter: Secondary | ICD-10-CM | POA: Diagnosis not present

## 2023-05-06 DIAGNOSIS — R066 Hiccough: Secondary | ICD-10-CM | POA: Diagnosis not present

## 2023-05-06 LAB — CBC WITH DIFFERENTIAL/PLATELET
Abs Immature Granulocytes: 0.38 10*3/uL — ABNORMAL HIGH (ref 0.00–0.07)
Basophils Absolute: 0.1 10*3/uL (ref 0.0–0.1)
Basophils Relative: 1 %
Eosinophils Absolute: 0.3 10*3/uL (ref 0.0–0.5)
Eosinophils Relative: 2 %
HCT: 40.4 % (ref 39.0–52.0)
Hemoglobin: 13.9 g/dL (ref 13.0–17.0)
Immature Granulocytes: 4 %
Lymphocytes Relative: 13 %
Lymphs Abs: 1.4 10*3/uL (ref 0.7–4.0)
MCH: 31.8 pg (ref 26.0–34.0)
MCHC: 34.4 g/dL (ref 30.0–36.0)
MCV: 92.4 fL (ref 80.0–100.0)
Monocytes Absolute: 1.4 10*3/uL — ABNORMAL HIGH (ref 0.1–1.0)
Monocytes Relative: 13 %
Neutro Abs: 7.4 10*3/uL (ref 1.7–7.7)
Neutrophils Relative %: 67 %
Platelets: 342 10*3/uL (ref 150–400)
RBC: 4.37 MIL/uL (ref 4.22–5.81)
RDW: 12.4 % (ref 11.5–15.5)
WBC: 10.9 10*3/uL — ABNORMAL HIGH (ref 4.0–10.5)
nRBC: 0 % (ref 0.0–0.2)

## 2023-05-06 MED ORDER — GABAPENTIN 100 MG PO CAPS
200.0000 mg | ORAL_CAPSULE | Freq: Three times a day (TID) | ORAL | Status: DC
  Administered 2023-05-06 – 2023-05-07 (×4): 200 mg via ORAL
  Filled 2023-05-06 (×4): qty 2

## 2023-05-06 NOTE — Progress Notes (Signed)
 Physical Therapy Session Note  Patient Details  Name: Paul [name]. MRN: [id number] Date of Birth: 25-Jun-1945  Today's Date: 05/06/2023 PT Individual Time: 1003-1115 PT Individual Time Calculation (min): 72 min   Short Term Goals: Week 1:  PT Short Term Goal 1 (Week 1): STGs = LTGs  Skilled Therapeutic Interventions/Progress Updates:      Pt seated in WC upon arrival. Pt agreeable to therapy. Pt reports 6/10 pain in B knee, pt requesting pain medicine. Notified nusing. Pt also reports skin irriation from knee immobilizing brace. Scott from [employer] present to adjust knee immobilizer brace and lateral bars. Therapist also adjusted leg rests to promote knee extension. Pt reports pain relief. Reached out to PA to determine if pt will be discharging with knee immobilizer brace or bledsoe brace. PA is going to verify with ortho.   Pt self propelled WC Up/down ramp with close supervision/intermittent CGA with acceleration of speed at bottom of ramp  Pt positioned WC next to hospital bed, and doffed leg rests with supervision. Stand pivot transfer WC to hospital bed at height of 27 inches to simulate height of bed at home with supervision, however pt required CGA for power up from bed 2/2 27 inch height, bed squishy surface and pt hands and butt at same height, trailed sit to stand from bed with B UE with on yoga blocks, with CGA/close supervision. Will continue to practice this.   Sit to supine with supervision, verbal cues provided for posterior scoot, pt able to raise B LE into bed today witout use of gait belt  Education provided for importance of pressure relief to reduce risk of pressure injury. Education provided for technique and frequency when in bed. Pt performed rolling B with supervision, verbal cues provided for technique. Will practice pressure relief in WC next session.   Pt supine in bed at end of session with all needs within reach and bed alarm on.   Therapy  Documentation Precautions:  Precautions Precautions: Knee Precaution/Restrictions Comments: B knee immobilizers Required Braces or Orthoses: Knee Immobilizer - Right, Knee Immobilizer - Left Knee Immobilizer - Right: On at all times Knee Immobilizer - Left: On at all times Restrictions Weight Bearing Restrictions Per Provider Order: No RLE Weight Bearing Per Provider Order: Weight bearing as tolerated (with sp tranfer only) LLE Weight Bearing Per Provider Order: Weight bearing as tolerated (with sp transfer only) Other Position/Activity Restrictions: Per orders : ok to weightbear for STPs only; per secure chat with Meghan, ortho PA, pt may do quad sets and SLR  Therapy/Group: Individual Therapy  [facility] Ambrose Finland, [place], DPT  05/06/2023, 7:41 AM

## 2023-05-06 NOTE — Progress Notes (Signed)
 Inpatient Rehabilitation  Patient information reviewed and entered into eRehab system by [facility]. Karen Kays., CCC/SLP, PPS Coordinator.  Information including medical coding, functional ability and quality indicators will be reviewed and updated through discharge.

## 2023-05-06 NOTE — Progress Notes (Signed)
 PROGRESS NOTE   Subjective/Complaints: Pain reasonably controlled but having problems with bars on KI LLE rubbing his thigh. Hiccups are present but better.   ROS: Patient denies fever, rash, sore throat, blurred vision, dizziness, nausea, vomiting, diarrhea, cough, shortness of breath or chest pain,  headache, or mood change.    Objective:   VAS [place] LOWER EXTREMITY VENOUS (DVT) Result Date: 05/04/2023  Lower Venous DVT Study Patient Name:  Paul [name].  Date of Exam:   05/04/2023 Medical Rec #: [id number]             Accession #:    [id number] Date of Birth: [birth year]             Patient Gender: M Patient Age:   78 years Exam Location:  [facility] Procedure:      VAS [place] LOWER EXTREMITY VENOUS (DVT) Referring Phys: Mariam Dollar --------------------------------------------------------------------------------  Indications: Swelling.  Risk Factors: None identified. Limitations: Poor ultrasound/tissue interface. Comparison Study: No prior studies. Performing Technologist: Chanda Busing RVT  Examination Guidelines: A complete evaluation includes B-mode imaging, spectral Doppler, color Doppler, and power Doppler as needed of all accessible portions of each vessel. Bilateral testing is considered an integral part of a complete examination. Limited examinations for reoccurring indications may be performed as noted. The reflux portion of the exam is performed with the patient in reverse Trendelenburg.  +---------+---------------+---------+-----------+----------+--------------+ RIGHT    CompressibilityPhasicitySpontaneityPropertiesThrombus Aging +---------+---------------+---------+-----------+----------+--------------+ CFV      Full           Yes      Yes                                 +---------+---------------+---------+-----------+----------+--------------+ SFJ      Full                                                         +---------+---------------+---------+-----------+----------+--------------+ FV Prox  Full                                                        +---------+---------------+---------+-----------+----------+--------------+ FV Mid                  Yes      Yes                                 +---------+---------------+---------+-----------+----------+--------------+ FV Distal               Yes      Yes                                 +---------+---------------+---------+-----------+----------+--------------+ PFV  Full                                                        +---------+---------------+---------+-----------+----------+--------------+ POP      Full           Yes      Yes                                 +---------+---------------+---------+-----------+----------+--------------+ PTV      Full                                                        +---------+---------------+---------+-----------+----------+--------------+ PERO     Full                                                        +---------+---------------+---------+-----------+----------+--------------+   +---------+---------------+---------+-----------+----------+--------------+ LEFT     CompressibilityPhasicitySpontaneityPropertiesThrombus Aging +---------+---------------+---------+-----------+----------+--------------+ CFV      Full           Yes      Yes                                 +---------+---------------+---------+-----------+----------+--------------+ SFJ      Full                                                        +---------+---------------+---------+-----------+----------+--------------+ FV Prox  Full                                                        +---------+---------------+---------+-----------+----------+--------------+ FV Mid   Full                                                         +---------+---------------+---------+-----------+----------+--------------+ FV Distal               Yes      Yes                                 +---------+---------------+---------+-----------+----------+--------------+ PFV      Full                                                        +---------+---------------+---------+-----------+----------+--------------+  POP      Full           Yes      Yes                                 +---------+---------------+---------+-----------+----------+--------------+ PTV      Full                                                        +---------+---------------+---------+-----------+----------+--------------+ PERO     Full                                                        +---------+---------------+---------+-----------+----------+--------------+     Summary: RIGHT: - There is no evidence of deep vein thrombosis in the lower extremity. However, portions of this examination were limited- see technologist comments above.  - No cystic structure found in the popliteal fossa.  LEFT: - There is no evidence of deep vein thrombosis in the lower extremity. However, portions of this examination were limited- see technologist comments above.  - No cystic structure found in the popliteal fossa.  *See table(s) above for measurements and observations. Electronically signed by Sherald Hess MD on 05/04/2023 at 4:52:20 PM.    Final    Recent Labs    05/03/23 1657 05/06/23 0636  WBC 10.3 10.9*  HGB 13.4 13.9  HCT 40.4 40.4  PLT 275 342   Recent Labs    05/03/23 1657 05/04/23 0952  NA  --  133*  K  --  4.2  CL  --  98  CO2  --  25  GLUCOSE  --  80  BUN  --  18  CREATININE 0.98 0.91  CALCIUM  --  8.3*    Intake/Output Summary (Last 24 hours) at 05/06/2023 0934 Last data filed at 05/06/2023 0806 Gross per 24 hour  Intake 237 ml  Output 2050 ml  Net -1813 ml        Physical Exam: Vital Signs Blood pressure 130/68, pulse  69, temperature 98.2 F (36.8 C), temperature source Oral, resp. rate 18, height 6' (1.829 m), weight 96.5 kg, SpO2 97%.  Physical Exam Constitutional: No distress . Vital signs reviewed. Consistent hiccups HEENT: NCAT, EOMI, oral membranes moist Neck: supple Cardiovascular: RRR without murmur. No JVD    Respiratory/Chest: CTA Bilaterally without wheezes or rales. Normal effort    GI/Abdomen: BS +, non-tender, non-distended Ext: no clubbing, cyanosis, or edema Psych: pleasant and cooperative  musculoskeletal:     Cervical back: No tenderness.     Comments: Left KI applied incorrectly, with both bars under leg Skin:    Comments: Warm and dry, both wounds dressed, no visible drainage Neurological:     Mental Status: He is alert and oriented to person, place, and time.     Cranial Nerves: Cranial nerves 2-12 are intact.     Comments: Alert and oriented x 3. Normal insight and awareness. Intact Memory. Normal language and speech.  Nerves II through XII grossly intact. MMT: 5/5 BUE prox to distal. BLE remain limited by KI's, ortho.  sensory exam  normal for light touch and pain in all 4 limbs.  Able to move both ankles.  No limb ataxia or cerebellar signs. No abnormal tone appreciated.  Neuro exam normal 2/24        Assessment/Plan: 1. Functional deficits which require 3+ hours per day of interdisciplinary therapy in a comprehensive inpatient rehab setting. Physiatrist is providing close team supervision and 24 hour management of active medical problems listed below. Physiatrist and rehab team continue to assess barriers to discharge/monitor patient progress toward functional and medical goals  Care Tool:  Bathing    Body parts bathed by patient: Right arm, Left arm, Left upper leg, Face, Chest, Abdomen, Front perineal area, Right upper leg   Body parts bathed by helper: Buttocks Body parts n/a: Right lower leg, Left lower leg   Bathing assist Assist Level: Minimal Assistance - Patient  > 75%     Upper Body Dressing/Undressing Upper body dressing   What is the patient wearing?: Hospital gown only    Upper body assist Assist Level: Set up assist    Lower Body Dressing/Undressing Lower body dressing      What is the patient wearing?: Underwear/pull up     Lower body assist Assist for lower body dressing: Minimal Assistance - Patient > 75%     Toileting Toileting    Toileting assist Assist for toileting: Maximal Assistance - Patient 25 - 49%     Transfers Chair/bed transfer  Transfers assist     Chair/bed transfer assist level: Minimal Assistance - Patient > 75%     Locomotion Ambulation   Ambulation assist   Ambulation activity did not occur: Safety/medical concerns          Walk 10 feet activity   Assist  Walk 10 feet activity did not occur: Safety/medical concerns        Walk 50 feet activity   Assist Walk 50 feet with 2 turns activity did not occur: Safety/medical concerns         Walk 150 feet activity   Assist Walk 150 feet activity did not occur: Safety/medical concerns         Walk 10 feet on uneven surface  activity   Assist Walk 10 feet on uneven surfaces activity did not occur: Safety/medical concerns         Wheelchair     Assist Is the patient using a wheelchair?: Yes Type of Wheelchair: Manual    Wheelchair assist level: Supervision/Verbal cueing Max wheelchair distance: 150    Wheelchair 50 feet with 2 turns activity    Assist        Assist Level: Supervision/Verbal cueing   Wheelchair 150 feet activity     Assist      Assist Level: Supervision/Verbal cueing   Blood pressure 130/68, pulse 69, temperature 98.2 F (36.8 C), temperature source Oral, resp. rate 18, height 6' (1.829 m), weight 96.5 kg, SpO2 97%.   Medical Problem List and Plan: 1. Functional deficits secondary to bilateral quadricep tendon rupture with large subcutaneous hematoma.  Status post quadricep  tendon repair 04/30/2023 ultrasound-guided aspiration of subcutaneous hematoma.  Bilateral knee immobilizers at all times nonweightbearing except okay to weightbearing as tolerated for pivot transfers.             -patient may shower             -ELOS/Goals: 7-10 days, supervision to min assist goals             -Continue CIR therapies  including PT, OT  2.  Antithrombotics: -DVT/anticoagulation: Eliquis initiated 05/03/2023             -antiplatelet therapy: N/A 3. Pain Management: Robaxin and oxycodone as needed  -2/22- increased oxycodone to 10 to 15 mg every 4 hours as needed.   4. Mood/Behavior/Sleep: Wellbutrin 150 mg daily.  Provide emotional support             -antipsychotic agents: N/A 5. Neuropsych/cognition: This patient is capable of making decisions on his own behalf. 6. Skin/Wound Care: Routine skin checks 7. Fluids/Electrolytes/Nutrition: labs reviewed 8.  New onset A-fib with RVR.  Patient responded to IV metoprolol transitioned to Lopressor 25 mg BID.   Eliquis initiated 05/03/2023.             -HR controlled and regular  -2/22 repeat EKG -remains in sinus rhythm  -2/24 heart rate remains controlled,  regular today 9.  Hypertension.   Monitor with increased mobility  -2/23 BP controlled, continue to monitor     05/06/2023    7:52 AM 05/06/2023    4:57 AM 05/06/2023    4:54 AM  Vitals with BMI  Weight  212 lbs 12 oz   BMI  28.85   Systolic 130  124  Diastolic 68  72  Pulse 69  72    10.  BPH.  Proscar 5 mg daily.  Check PVR.  Denies difficulty voiding his bladder 11.  COPD with tobacco abuse.  Check oxygen saturations every shift.  Continue inhalers as directed.  Provide counseling 12.  Diastolic congestive heart failure.  Monitor for any signs of fluid overload.             -daily weights-stable to decreased  Filed Weights   05/03/23 1619 05/05/23 0457 05/06/23 0457  Weight: 99.7 kg 96.6 kg 96.5 kg    13.  GERD.  Protonix 14.  Hyperlipidemia.  Mild rhabdo and  lipitor held 15. Constipation              -LBM 2/17, order Sorbitol 45ml  -2/22 Mg citrate today, can try suppository if no results.  Will also add enema as needed severe constipation.  -2/22 BM   after enema. daily MiraLAX and Senokot help prevent opioid related constipation 16.  Insomnia               -Restarted Ambien 5 mg as needed-he takes this at home 17.  Hiccups  -Had Thorazine 25 mg as needed during acute hospitalization  -2/22 will start gabapentin 100 mg 3 times daily  -12/24 gabapentin increased to 200mg  tid effective today. -obsv     LOS: 3 days A FACE TO FACE EVALUATION WAS PERFORMED  Ranelle Oyster 05/06/2023, 9:34 AM

## 2023-05-06 NOTE — Plan of Care (Signed)
 Downgraded for equipment management.  Problem: RH Bed to Chair Transfers Goal: LTG Patient will perform bed/chair transfers w/assist (PT) Description: LTG: Patient will perform bed to chair transfers with assistance (PT). Flowsheets (Taken 05/06/2023 1233) LTG: Pt will perform Bed to Chair Transfers with assistance level: Set up assist

## 2023-05-06 NOTE — Plan of Care (Signed)
  Problem: RH Bathing Goal: LTG Patient will bathe all body parts with assist levels (OT) Description: LTG: Patient will bathe all body parts with assist levels (OT) Flowsheets (Taken 05/06/2023 1253) LTG: Pt will perform bathing with assistance level/cueing:  Supervision/Verbal cueing  Set up assist Note: Goal downgraded due to assistance needed with BLE bracing.

## 2023-05-06 NOTE — Progress Notes (Signed)
 Patient ID: Paul [name]., male   DOB: 1945/07/20, 78 y.o.   MRN: [id number]  Have ordered wheelchair and 3 in 1 in preparation for discharge later this week. Wife to be here tomorrow for actual car transfer to see how it goes

## 2023-05-06 NOTE — Progress Notes (Signed)
 Inpatient Rehabilitation Center Individual Statement of Services  Patient Name:  Paul [name].  Date:  05/06/2023  Welcome to the Inpatient Rehabilitation Center.  Our goal is to provide you with an individualized program based on your diagnosis and situation, designed to meet your specific needs.  With this comprehensive rehabilitation program, you will be expected to participate in at least 3 hours of rehabilitation therapies Monday-Friday, with modified therapy programming on the weekends.  Your rehabilitation program will include the following services:  Physical Therapy (PT), Occupational Therapy (OT), 24 hour per day rehabilitation nursing, Therapeutic Recreaction (TR), Neuropsychology, Care Coordinator, Rehabilitation Medicine, Nutrition Services, and Pharmacy Services  Weekly team conferences will be held on Wednesday to discuss your progress.  Your Inpatient Rehabilitation Care Coordinator will talk with you frequently to get your input and to update you on team discussions.  Team conferences with you and your family in attendance may also be held.  Expected length of stay: 5-7 days  Overall anticipated outcome: mod/I-supervision level  Depending on your progress and recovery, your program may change. Your Inpatient Rehabilitation Care Coordinator will coordinate services and will keep you informed of any changes. Your Inpatient Rehabilitation Care Coordinator's name and contact numbers are listed  below.  The following services may also be recommended but are not provided by the Inpatient Rehabilitation Center:  Driving Evaluations Home Health Rehabiltiation Services Outpatient Rehabilitation Services Vocational Rehabilitation   Arrangements will be made to provide these services after discharge if needed.  Arrangements include referral to agencies that provide these services.  Your insurance has been verified to be:  BCBS and Medicare part A Your primary doctor is:  Macarthur Critchley  [place]  Pertinent information will be shared with your doctor and your insurance company.  Inpatient Rehabilitation Care Coordinator:  Dossie Der, [place] [phone] or Luna Glasgow  Information discussed with and copy given to patient by: Lucy Chris, 05/06/2023, 9:25 AM

## 2023-05-06 NOTE — Plan of Care (Signed)
  Problem: Consults Goal: RH GENERAL PATIENT EDUCATION Description: See Patient Education module for education specifics. Outcome: Progressing   Problem: RH BOWEL ELIMINATION Goal: RH STG MANAGE BOWEL WITH ASSISTANCE Description: STG Manage Bowel with toileting Assistance. Outcome: Progressing Goal: RH STG MANAGE BOWEL W/MEDICATION W/ASSISTANCE Description: STG Manage Bowel with Medication with mod I Assistance. Outcome: Not Progressing Note: Patient refused Miralax x2. Patients states that he does not eat very much.    Problem: RH BLADDER ELIMINATION Goal: RH STG MANAGE BLADDER WITH ASSISTANCE Description: STG Manage Bladder With toileting Assistance Outcome: Progressing Goal: RH STG MANAGE BLADDER WITH MEDICATION WITH ASSISTANCE Description: STG Manage Bladder With Medication With mod I Assistance. Outcome: Progressing   Problem: RH SKIN INTEGRITY Goal: RH STG ABLE TO PERFORM INCISION/WOUND CARE W/ASSISTANCE Description: STG Able To Perform Incision/Wound Care With min  Assistance. Outcome: Progressing   Problem: RH SAFETY Goal: RH STG ADHERE TO SAFETY PRECAUTIONS W/ASSISTANCE/DEVICE Description: STG Adhere to Safety Precautions With cues Assistance/Device. Outcome: Progressing   Problem: RH PAIN MANAGEMENT Goal: RH STG PAIN MANAGED AT OR BELOW PT'S PAIN GOAL Description: Pain < 4 with prns Outcome: Progressing   Problem: RH KNOWLEDGE DEFICIT GENERAL Goal: RH STG INCREASE KNOWLEDGE OF SELF CARE AFTER HOSPITALIZATION Description: Patient and wife will be able to manage care at discharge using educational resources for medications, skin care, dietary modification, etc. independently Outcome: Progressing

## 2023-05-06 NOTE — Progress Notes (Signed)
 Orthopedic Tech Progress Note Patient Details:  Paul [name] 1946-01-31 [id number]  Called in order to [employer] for BLE BLEDSOE BRACES locked in full extension   Patient ID: Paul Blazer., male   DOB: 03/29/1945, 78 y.o.   MRN: [id number]  Paul [name] 05/06/2023, 11:39 AM
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
## 2023-05-07 DIAGNOSIS — S76119D Strain of unspecified quadriceps muscle, fascia and tendon, subsequent encounter: Secondary | ICD-10-CM | POA: Diagnosis not present

## 2023-05-07 DIAGNOSIS — I10 Essential (primary) hypertension: Secondary | ICD-10-CM | POA: Diagnosis not present

## 2023-05-07 DIAGNOSIS — K5903 Drug induced constipation: Secondary | ICD-10-CM | POA: Diagnosis not present

## 2023-05-07 DIAGNOSIS — I4891 Unspecified atrial fibrillation: Secondary | ICD-10-CM | POA: Diagnosis not present

## 2023-05-07 MED ORDER — SENNOSIDES-DOCUSATE SODIUM 8.6-50 MG PO TABS: 2.0000 | ORAL_TABLET | Freq: Every day | ORAL | Status: DC

## 2023-05-07 MED ORDER — BUPROPION HCL ER (XL) 150 MG PO TB24
150.0000 mg | ORAL_TABLET | Freq: Every day | ORAL | Status: DC
  Administered 2023-05-08 – 2023-05-09 (×2): 150 mg via ORAL
  Filled 2023-05-07 (×2): qty 1

## 2023-05-07 MED ORDER — POLYETHYLENE GLYCOL 3350 17 G PO PACK: 17.0000 g | PACK | Freq: Two times a day (BID) | ORAL | Status: DC

## 2023-05-07 MED ORDER — ACETAMINOPHEN 325 MG PO TABS
325.0000 mg | ORAL_TABLET | Freq: Four times a day (QID) | ORAL | Status: DC | PRN
Start: 2023-05-07 — End: 2023-05-09

## 2023-05-07 MED ORDER — MAGNESIUM OXIDE -MG SUPPLEMENT 400 (240 MG) MG PO TABS
400.0000 mg | ORAL_TABLET | Freq: Every day | ORAL | Status: DC
  Administered 2023-05-07 – 2023-05-09 (×3): 400 mg via ORAL
  Filled 2023-05-07 (×3): qty 1

## 2023-05-07 MED ORDER — GABAPENTIN 300 MG PO CAPS
300.0000 mg | ORAL_CAPSULE | Freq: Three times a day (TID) | ORAL | Status: DC
  Administered 2023-05-07 – 2023-05-09 (×6): 300 mg via ORAL
  Filled 2023-05-07 (×6): qty 1

## 2023-05-07 MED ORDER — SORBITOL 70 % SOLN
30.0000 mL | Freq: Every day | Status: DC | PRN
  Administered 2023-05-07: 30 mL via ORAL
  Filled 2023-05-07: qty 30

## 2023-05-07 MED ORDER — POLYETHYLENE GLYCOL 3350 17 G PO PACK
17.0000 g | PACK | Freq: Two times a day (BID) | ORAL | Status: DC
  Administered 2023-05-08 (×2): 17 g via ORAL
  Filled 2023-05-07 (×4): qty 1

## 2023-05-07 NOTE — Progress Notes (Signed)
 Physical Therapy Session Note  Patient Details  Name: Paul [name]. MRN: [id number] Date of Birth: [birth year]  Today's Date: 05/07/2023 PT Individual Time: 3664-4034, 1330-1359, 1500-1530 PT Individual Time Calculation (min): 58 min, 29 min, 30 min   Short Term Goals: Week 1:  PT Short Term Goal 1 (Week 1): STGs = LTGs  Skilled Therapeutic Interventions/Progress Updates:      Treatment session 1  Pt seated in WC upon arrival. Pt agreeable to therapy. Pt reports 4/10 pain in B knees, premedicated. Pt reports medicine has been helping with pain management.   Called pt wife and provided education on where to go for family training this afternoon to practice car transfer. Discussed DME: pt is going to purchase RW, reacher, and sock aid on [place], pt is going to pay copay, and 3 in 1 BSC and WC will be delivered to room  Followed up with pt wife over phone regarding ramp, Pt wife plans to purchase ramp from [employer] [facility] and have it installed by Thursday.   Pt able to recall technique and frequeny for pressure relief in the bed. Education provided for pressure relief while in the WC. Education provided for technqiue to do WC push up with legs on elevating leg rests, and brakes locked, and/or standing with RW. Recommneded pt perform pressure relief every 30 min.   Pt positioned WC, doffed leg rests, and performed stand pivot transfer WC to bed at height of 27 inches with set up assist for RW. Sit to supine with mod I. Pt demos increased pain with sit to supine today L knee > R knee, requiring supine rest break. Education provided for use of gait belt to assist L LE as needed for pain management. Pt also contributes some of the pain to his shoes 2/2 the friction. Attempted supine to sit and sit to supine without shoes however discontinued 2/2 increase in pain. Pt reports decreased pain with use of gait belt.   Therapist also adjusted bledose brace, into proper positioning, as it  had slid down slightly. Therapist also adjusted velcro straps to accommodate for fluctuate in swelling. Education provided on proper positioning of brace to lock knee into extension. Education provided if it slides down to first just unbuckle the brace and slide it up. Education/and demonstration provided on how to adjust the velcro straps to accommodate for swelling. Pt able to correctly articulate if strap is too tight/loose, and able to educate caregiver how to adjust it.  Pt supine in bed with all needs within reach and bed alarm on.   Treatment Session 2   Pt supine in bed upon arrival. Pt agreeable to therapy. Pt denies any pain.   Supine to sit with mod I, stand pivot transfer bed to [facility] with set up assist. Therapist donned leg rest and transported pt dependently in WC to time conservation.   Pt performed stand pivot transfer WC to pt car with supervision, verbal cues provided for technique with stand to sit, education provided to wife on equipment management, including ensuring brakes are locked, how to fold WC, donning/doffing leg rests, and setting up/folding RW. Pt and pt wife returned demonstration. Pt and pt wife denied any questions or concerns.   Pt seated in WC at end of session with all needs within reach and wife in room.   Treatment Session 3   Pt seated in WC upon arrival. Pt agreeable to therapy. Pt denies any pain. Pt wife present for continued family training.  Pt wife reports she went to [employer] with the dimension of her stairs, and they expressed concern that she would need a custom ramp as the steps are not 3 standard 6 inch step (1 step is 6 inches, 1 is 7.5 inches, and 1 is 8 inches). Pt wife reprots they are coming tomorrow to determine if the ramp they have is adequate or if they need custom ramp. Education provided on non emergency medical transport for assist into home entry in the event pt unable to have ramp installed prior to discharge date 2/27. Notified  Child psychotherapist.   Education provided to pt wife regarding pt NWB B LE precautions, with exception of WBAT for stand pivot transfer only.   Education provided for set up assist needed for bed to chair transfer. Pt performed stand pivot transfer WC to hopsitla bed at height of 27 inches (to simulate bed at home) with pt wife demonstrating carry over of technique and equipment management (from practice with car transfer).   Education provided on pt needing assist for adjusting brace (with fluctuation in swelling, as well as brace sliding down intermittently), therapist provided demonstration to pt wife regarding proper positioning, as well as adjustment of velcro straps for accomodation for swelling. Pt able to walk therapist through how to assist. Pt and pt wife deny any questions/concerns.   Discussed recommendation for follow up OPPT once restrictions are lifted. Pt and wife verbalized understanding and agreeable.       Therapy Documentation Precautions:  Precautions Precautions: Knee Precaution/Restrictions Comments: B knee immobilizers Required Braces or Orthoses: Knee Immobilizer - Right, Knee Immobilizer - Left Knee Immobilizer - Right: On at all times Knee Immobilizer - Left: On at all times Restrictions Weight Bearing Restrictions Per Provider Order: Yes RLE Weight Bearing Per Provider Order: Weight bearing as tolerated LLE Weight Bearing Per Provider Order: Weight bearing as tolerated Other Position/Activity Restrictions: Per orders : ok to weightbear for STPs only; per secure chat with Meghan, ortho PA, pt may do quad sets and SLR  Therapy/Group: Individual Therapy  [facility] Ambrose Finland, [place], DPT  05/07/2023, 7:51 AM

## 2023-05-07 NOTE — Progress Notes (Signed)
 PROGRESS NOTE   Subjective/Complaints: Patient reports his pain is under fair control.  He got Bledsoe braces, likes this a lot better.  Hiccups a little improved with gabapentin but continue to occur intermittently.  LBM 2/22  ROS: Patient denies fever, new vision changes, dizziness, nausea, vomiting, diarrhea,  shortness of breath or chest pain, headache, or mood change. + constipation    Objective:   No results found.  Recent Labs    05/06/23 0636  WBC 10.9*  HGB 13.9  HCT 40.4  PLT 342   No results for input(s): "NA", "K", "CL", "CO2", "GLUCOSE", "BUN", "CREATININE", "CALCIUM" in the last 72 hours.   Intake/Output Summary (Last 24 hours) at 05/07/2023 1053 Last data filed at 05/07/2023 0743 Gross per 24 hour  Intake 237 ml  Output 1845 ml  Net -1608 ml        Physical Exam: Vital Signs Blood pressure 125/73, pulse 71, temperature 98.2 F (36.8 C), resp. rate 18, height 6' (1.829 m), weight 90.4 kg, SpO2 96%.  Physical Exam Constitutional: No distress . Vital signs reviewed. Consistent hiccups HEENT: NCAT, EOMI, oral membranes moist Neck: supple Cardiovascular: RRR without murmur. No JVD    Respiratory/Chest: CTA Bilaterally without wheezes or rales. Normal effort    GI/Abdomen: BS +, non-tender, non-distended Ext: no clubbing, cyanosis, or edema Psych: pleasant and cooperative  musculoskeletal:     Cervical back: No tenderness.     Comments: Left KI applied incorrectly, with both bars under leg Skin:    Comments: Warm and dry, both wounds dressed, no visible drainage  Bledsoe braces bilateral knees Neurological:     Mental Status: He is alert and oriented to person, place, and time.     Cranial Nerves: Cranial nerves 2-12 are intact.     Comments: Alert and oriented x 3. Normal insight and awareness. Intact Memory. Normal language and speech.  Nerves II through XII grossly intact. MMT: 5/5 BUE prox  to distal. BLE remain limited by KI's, ortho.  sensory exam normal for light touch and pain in all 4 limbs.  Able to move both ankles.  No limb ataxia or cerebellar signs. No abnormal tone appreciated.  Neuro exam normal 2/24        Assessment/Plan: 1. Functional deficits which require 3+ hours per day of interdisciplinary therapy in a comprehensive inpatient rehab setting. Physiatrist is providing close team supervision and 24 hour management of active medical problems listed below. Physiatrist and rehab team continue to assess barriers to discharge/monitor patient progress toward functional and medical goals  Care Tool:  Bathing    Body parts bathed by patient: Right arm, Left arm, Left upper leg, Face, Chest, Abdomen, Front perineal area, Right upper leg   Body parts bathed by helper: Buttocks Body parts n/a: Right lower leg, Left lower leg   Bathing assist Assist Level: Minimal Assistance - Patient > 75%     Upper Body Dressing/Undressing Upper body dressing   What is the patient wearing?: Hospital gown only    Upper body assist Assist Level: Set up assist    Lower Body Dressing/Undressing Lower body dressing      What is the patient wearing?:  Underwear/pull up     Lower body assist Assist for lower body dressing: Minimal Assistance - Patient > 75%     Toileting Toileting    Toileting assist Assist for toileting: Maximal Assistance - Patient 25 - 49%     Transfers Chair/bed transfer  Transfers assist     Chair/bed transfer assist level: Contact Guard/Touching assist     Locomotion Ambulation   Ambulation assist   Ambulation activity did not occur: Safety/medical concerns (B LE NWB)          Walk 10 feet activity   Assist  Walk 10 feet activity did not occur: Safety/medical concerns        Walk 50 feet activity   Assist Walk 50 feet with 2 turns activity did not occur: Safety/medical concerns         Walk 150 feet  activity   Assist Walk 150 feet activity did not occur: Safety/medical concerns         Walk 10 feet on uneven surface  activity   Assist Walk 10 feet on uneven surfaces activity did not occur: Safety/medical concerns         Wheelchair     Assist Is the patient using a wheelchair?: Yes Type of Wheelchair: Manual    Wheelchair assist level: Supervision/Verbal cueing Max wheelchair distance: 150    Wheelchair 50 feet with 2 turns activity    Assist        Assist Level: Supervision/Verbal cueing   Wheelchair 150 feet activity     Assist      Assist Level: Supervision/Verbal cueing   Blood pressure 125/73, pulse 71, temperature 98.2 F (36.8 C), resp. rate 18, height 6' (1.829 m), weight 90.4 kg, SpO2 96%.   Medical Problem List and Plan: 1. Functional deficits secondary to bilateral quadricep tendon rupture with large subcutaneous hematoma.  Status post quadricep tendon repair 04/30/2023 ultrasound-guided aspiration of subcutaneous hematoma.  Bilateral knee immobilizers at all times nonweightbearing except okay to weightbearing as tolerated for pivot transfers.             -patient may shower             -ELOS/Goals: 7-10 days, supervision to min assist goals             -Continue CIR therapies including PT, OT   -Team conference tomorrow 2.  Antithrombotics: -DVT/anticoagulation: Eliquis initiated 05/03/2023             -antiplatelet therapy: N/A 3. Pain Management: Robaxin and oxycodone as needed  -2/22- increased oxycodone to 10 to 15 mg every 4 hours as needed.   4. Mood/Behavior/Sleep: Wellbutrin 150 mg daily.  Provide emotional support             -antipsychotic agents: N/A 5. Neuropsych/cognition: This patient is capable of making decisions on his own behalf. 6. Skin/Wound Care: Routine skin checks 7. Fluids/Electrolytes/Nutrition: labs reviewed 8.  New onset A-fib with RVR.  Patient responded to IV metoprolol transitioned to Lopressor 25  mg BID.   Eliquis initiated 05/03/2023.             -HR controlled and regular  -2/22 repeat EKG -remains in sinus rhythm  -2/25 heart rate stable, continue to monitor 9.  Hypertension.   Monitor with increased mobility  -2/25 BP controlled continue current regimen     05/07/2023    7:44 AM 05/07/2023    4:36 AM 05/06/2023    7:37 PM  Vitals with BMI  Weight  199 lbs 5 oz   BMI  27.02   Systolic 125 127 027  Diastolic 73 78 71  Pulse 71 75 79    10.  BPH.  Proscar 5 mg daily.  Check PVR.  Denies difficulty voiding his bladder 11.  COPD with tobacco abuse.  Check oxygen saturations every shift.  Continue inhalers as directed.  Provide counseling 12.  Diastolic congestive heart failure.  Monitor for any signs of fluid overload.             -daily weights-stable to decreased  Filed Weights   05/05/23 0457 05/06/23 0457 05/07/23 0436  Weight: 96.6 kg 96.5 kg 90.4 kg    13.  GERD.  Protonix 14.  Hyperlipidemia.  Mild rhabdo and lipitor held 15. Constipation              -LBM 2/17, order Sorbitol 45ml  -2/22 Mg citrate today, can try suppository if no results.  Will also add enema as needed severe constipation.  -2/22 BM   after enema. daily MiraLAX and Senokot help prevent opioid related constipation  -2/25 increase MiraLAX to twice daily, add magnesium oxide 400 mg.  Add sorbitol as needed 16.  Insomnia               -Restarted Ambien 5 mg as needed-he takes this at home 17.  Hiccups  -Had Thorazine 25 mg as needed during acute hospitalization  -2/22 will start gabapentin 100 mg 3 times daily  -12/24 gabapentin increased to 200mg  tid effective today. -obsv  -2/25 increase gabapentin to 300 mg 3 times daily     LOS: 4 days A FACE TO FACE EVALUATION WAS PERFORMED  Fanny Dance 05/07/2023, 10:53 AM

## 2023-05-07 NOTE — Progress Notes (Signed)
 This patient verbalize his current medications and prn medications for pain is not resolving his pain but for about 2-3 hours intervals and he needs to speak with someone tomorrow concerning this issue. Reviewed with patient his current schedule//prn medications. I encourage patient to communicate with his medication. Patient also is also concern with his 6 day expense with his intermittent episode with current hiccups. Continue to provide support as needed;    05/07/2023 F/U with pain meds patient continue to verbalize slight result. Attempt to assist with repositioning and KPAD applied, po Tylenol for pain administered  0630 Patient denies need for prn at this time, rate pain 5/10 on pain scale,Continue to assist and monitor

## 2023-05-07 NOTE — Progress Notes (Signed)
 Occupational Therapy Session Note  Patient Details  Name: Paul [name]. MRN: [id number] Date of Birth: September 26, 1945  Today's Date: 05/07/2023 Session 1 OT Individual Time: 9147-8295 OT Individual Time Calculation (min): 45 min   Session 2 OT Individual Time: 1415-1500 OT Individual Time Calculation (min): 45 min    Short Term Goals: Week 1:  OT Short Term Goal 1 (Week 1): STGs=LTGs due to patient's estimated length of stay.  Skilled Therapeutic Interventions/Progress Updates:     Session 1 Pt greeted semi-reclined in bed and agreeable to OT treatment session. Pt completed bed mobility w/ HOB elevated and able to get to EOB without use of leg lifter. Spent time working on increased independence donning socks and shoes using LH reacher elastic shoe laces,  and sock-aid. Pt completed sit<>stand from elevated bed with RW and CGA. CGA pivot to wc. UB strength/endurnace with wc propulsion to therapy gym. UB there-ex using green theraband 3 sets of 10 chest press, triceps press, straight arm raise and side raise. Pt returned to room and left seated in wc with alarm on, call bell in reach, and needs met.   Session 2 Pt greeted in wc with spouse present for family education. Education provided regarding safe BADL performance in home environment, home modifications, DME needs, energy conservation techniques, and safety awareness.  Discussed bathroom set-up at length and educated on use of BSC in shower as well as covering B LE braces for shower. Educated on uses of [facility] equipment for LB BADLs and showed pt and family where to purchase items if they wish. OT issued home UB exercise program and went through each exercise 10x.  Straight Arm Pulls x10 Straight Arm Raise x10 Side Arm Raise x10 Over Head Pull Down x10 Forearm Pull x10 Triceps Elbow Extension x10 Bicep Curl x10  Pt left seated in wc with spouse present and needs met.   Therapy Documentation Precautions:   Precautions Precautions: Knee Precaution/Restrictions Comments: B knee immobilizers Required Braces or Orthoses: Knee Immobilizer - Right, Knee Immobilizer - Left Knee Immobilizer - Right: On at all times Knee Immobilizer - Left: On at all times Restrictions Weight Bearing Restrictions Per Provider Order: Yes RLE Weight Bearing Per Provider Order: Weight bearing as tolerated LLE Weight Bearing Per Provider Order: Weight bearing as tolerated Other Position/Activity Restrictions: Per orders : ok to weightbear for STPs only; per secure chat with Meghan, ortho PA, pt may do quad sets and SLR Pain: Pain Assessment Pain Scale: 0-10 Pain Score: 6  Pain Location: Leg (Bilateral) Pain Intervention(s): Medication (See eMAR)    Therapy/Group: Individual Therapy  Mal Amabile 05/07/2023, 3:02 PM

## 2023-05-07 NOTE — Progress Notes (Addendum)
 Patient ID: Paul [name]., male   DOB: 26-Feb-1946, 78 y.o.   MRN: [id number]  Gave number to [employer] to pay the co-pay and then will get his equipment delivered to his room. He will call once wife gets here  2:43 PM According to team pt will reach goals and will be ready for Dc Thursday 2/27. MD on board also

## 2023-05-07 NOTE — IPOC Note (Signed)
 Overall Plan of Care (IPOC) Patient Details Name: Paul [name]. MRN: [id number] DOB: May 31, 1945  Admitting Diagnosis: Quadriceps tendon rupture  Hospital Problems: Principal Problem:   Quadriceps tendon rupture     Functional Problem List: Nursing Bowel, Safety, Endurance, Medication Management, Pain, Bladder  PT Endurance, Balance, Motor  OT Balance, Pain, Safety, Endurance  SLP    TR         Basic ADL's: OT Bathing, Dressing, Toileting     Advanced  ADL's: OT Simple Meal Preparation     Transfers: PT Bed Mobility, Bed to Chair, Car  OT Toilet, Tub/Shower     Locomotion: PT Wheelchair Mobility     Additional Impairments: OT    SLP        TR      Anticipated Outcomes Item Anticipated Outcome  Self Feeding Independent  Swallowing      Basic self-care  Set up/ Mod Independent  Toileting  set up   Bathroom Transfers SBA  Bowel/Bladder  manage bowel and bladder w mod I assist  Transfers  mod I transfers, S car transfers  Locomotion  mod I w/c mobility  Communication     Cognition     Pain  < 4 with prns  Safety/Judgment  manage w cues   Therapy Plan: PT Intensity: Minimum of 1-2 x/day ,45 to 90 minutes PT Frequency: 5 out of 7 days PT Duration Estimated Length of Stay: 5 to 7 days OT Intensity: Minimum of 1-2 x/day, 45 to 90 minutes OT Frequency: 5 out of 7 days OT Duration/Estimated Length of Stay: 5-7     Team Interventions: Nursing Interventions Patient/Family Education, Pain Management, Bladder Management, Medication Management, Discharge Planning, Bowel Management, Disease Management/Prevention  PT interventions Balance/vestibular training, Discharge planning, Functional mobility training, Patient/family education, Therapeutic Exercise, UE/LE Coordination activities, Therapeutic Activities, DME/adaptive equipment instruction, UE/LE Strength taining/ROM, Wheelchair propulsion/positioning  OT Interventions Balance/vestibular  training, Discharge planning, Self Care/advanced ADL retraining, Therapeutic Activities, Therapeutic Exercise, Patient/family education, Functional mobility training, DME/adaptive equipment instruction, UE/LE Strength taining/ROM, Wheelchair propulsion/positioning  SLP Interventions    TR Interventions    SW/CM Interventions Discharge Planning, Psychosocial Support, Patient/Family Education   Barriers to Discharge MD  Medical stability  Nursing Decreased caregiver support, Home environment access/layout, Weight bearing restrictions 2 level, main B+B 3/5 ste bil rails; ramp recommended for entry to home at discharge  PT Inaccessible home environment 3 steps to enter, pt plans to rent a ramp  OT      SLP      SW Weight bearing restrictions     Team Discharge Planning: Destination: PT-Home ,OT- Home , SLP-  Projected Follow-up: PT-Home health PT, OT-  Home health OT, SLP-  Projected Equipment Needs: PT-To be determined, OT- 3 in 1 bedside comode, Wheelchair (measurements), To be determined, SLP-  Equipment Details: PT- , OT-TBD best DME for shower stall Patient/family involved in discharge planning: PT- Patient,  OT-Patient, SLP-   MD ELOS: 5-7 days Medical Rehab Prognosis:  Excellent Assessment: The patient has been admitted for CIR therapies with the diagnosis of bilateral quadricep tendon rupture with large subcutaneous hematoma . The team will be addressing functional mobility, strength, stamina, balance, safety, adaptive techniques and equipment, self-care, bowel and bladder mgt, patient and caregiver education. Goals have been set at Mod I. Anticipated discharge destination is home.        See Team Conference Notes for weekly updates to the plan of care

## 2023-05-08 DIAGNOSIS — S76119D Strain of unspecified quadriceps muscle, fascia and tendon, subsequent encounter: Secondary | ICD-10-CM | POA: Diagnosis not present

## 2023-05-08 DIAGNOSIS — I4891 Unspecified atrial fibrillation: Secondary | ICD-10-CM | POA: Diagnosis not present

## 2023-05-08 DIAGNOSIS — M79604 Pain in right leg: Secondary | ICD-10-CM | POA: Diagnosis not present

## 2023-05-08 DIAGNOSIS — K5903 Drug induced constipation: Secondary | ICD-10-CM | POA: Diagnosis not present

## 2023-05-08 LAB — CBC WITH DIFFERENTIAL/PLATELET
Abs Immature Granulocytes: 0.3 10*3/uL — ABNORMAL HIGH (ref 0.00–0.07)
Basophils Absolute: 0.1 10*3/uL (ref 0.0–0.1)
Basophils Relative: 1 %
Eosinophils Absolute: 0.2 10*3/uL (ref 0.0–0.5)
Eosinophils Relative: 2 %
HCT: 43.3 % (ref 39.0–52.0)
Hemoglobin: 14.8 g/dL (ref 13.0–17.0)
Immature Granulocytes: 3 %
Lymphocytes Relative: 12 %
Lymphs Abs: 1.3 10*3/uL (ref 0.7–4.0)
MCH: 31.6 pg (ref 26.0–34.0)
MCHC: 34.2 g/dL (ref 30.0–36.0)
MCV: 92.5 fL (ref 80.0–100.0)
Monocytes Absolute: 1.4 10*3/uL — ABNORMAL HIGH (ref 0.1–1.0)
Monocytes Relative: 14 %
Neutro Abs: 7 10*3/uL (ref 1.7–7.7)
Neutrophils Relative %: 68 %
Platelets: 379 10*3/uL (ref 150–400)
RBC: 4.68 MIL/uL (ref 4.22–5.81)
RDW: 12.5 % (ref 11.5–15.5)
WBC: 10.2 10*3/uL (ref 4.0–10.5)
nRBC: 0 % (ref 0.0–0.2)

## 2023-05-08 LAB — BASIC METABOLIC PANEL
Anion gap: 8 (ref 5–15)
BUN: 19 mg/dL (ref 8–23)
CO2: 24 mmol/L (ref 22–32)
Calcium: 8.4 mg/dL — ABNORMAL LOW (ref 8.9–10.3)
Chloride: 101 mmol/L (ref 98–111)
Creatinine, Ser: 0.94 mg/dL (ref 0.61–1.24)
GFR, Estimated: >60 mL/min (ref >60)
Glucose, Bld: 99 mg/dL (ref 70–99)
Potassium: 4.3 mmol/L (ref 3.5–5.1)
Sodium: 133 mmol/L — ABNORMAL LOW (ref 135–145)

## 2023-05-08 MED ORDER — APIXABAN 5 MG PO TABS
5.0000 mg | ORAL_TABLET | Freq: Two times a day (BID) | ORAL | 0 refills | Status: DC
  Filled 2023-05-08: qty 60, 30d supply, fill #0

## 2023-05-08 MED ORDER — BACLOFEN 5 MG PO TABS
5.0000 mg | ORAL_TABLET | Freq: Three times a day (TID) | ORAL | 0 refills | Status: DC | PRN
  Filled 2023-05-08: qty 10, 4d supply, fill #0

## 2023-05-08 MED ORDER — BACLOFEN 5 MG HALF TABLET
5.0000 mg | ORAL_TABLET | Freq: Three times a day (TID) | ORAL | Status: DC | PRN
  Administered 2023-05-08: 5 mg via ORAL
  Filled 2023-05-08: qty 1

## 2023-05-08 MED ORDER — CEPHALEXIN 250 MG PO CAPS
250.0000 mg | ORAL_CAPSULE | Freq: Four times a day (QID) | ORAL | Status: DC
  Administered 2023-05-08 – 2023-05-09 (×4): 250 mg via ORAL
  Filled 2023-05-08 (×4): qty 1

## 2023-05-08 MED ORDER — METHOCARBAMOL 500 MG PO TABS
500.0000 mg | ORAL_TABLET | Freq: Four times a day (QID) | ORAL | 0 refills | Status: DC | PRN
  Filled 2023-05-08: qty 45, 12d supply, fill #0

## 2023-05-08 MED ORDER — SORBITOL 70 % SOLN
60.0000 mL | Freq: Once | Status: AC
  Administered 2023-05-08: 60 mL via ORAL
  Filled 2023-05-08: qty 60

## 2023-05-08 MED ORDER — METOPROLOL TARTRATE 25 MG PO TABS
25.0000 mg | ORAL_TABLET | Freq: Two times a day (BID) | ORAL | 0 refills | Status: DC
  Filled 2023-05-08: qty 60, 30d supply, fill #0

## 2023-05-08 MED ORDER — CEPHALEXIN 250 MG PO CAPS: 250.0000 mg | ORAL_CAPSULE | Freq: Four times a day (QID) | ORAL | Status: DC

## 2023-05-08 MED ORDER — PANTOPRAZOLE SODIUM 40 MG PO TBEC
40.0000 mg | DELAYED_RELEASE_TABLET | Freq: Every day | ORAL | 0 refills | Status: DC
  Filled 2023-05-08: qty 30, 30d supply, fill #0

## 2023-05-08 MED ORDER — CEPHALEXIN 250 MG PO CAPS
250.0000 mg | ORAL_CAPSULE | Freq: Four times a day (QID) | ORAL | 0 refills | Status: DC
  Filled 2023-05-08: qty 26, 7d supply, fill #0

## 2023-05-08 MED ORDER — MAGNESIUM OXIDE -MG SUPPLEMENT 400 (240 MG) MG PO TABS
400.0000 mg | ORAL_TABLET | Freq: Every day | ORAL | 0 refills | Status: DC
  Filled 2023-05-08: qty 30, 30d supply, fill #0

## 2023-05-08 MED ORDER — BUPROPION HCL ER (XL) 150 MG PO TB24
150.0000 mg | ORAL_TABLET | Freq: Every day | ORAL | 0 refills | Status: DC
  Filled 2023-05-08: qty 30, 30d supply, fill #0

## 2023-05-08 MED ORDER — OXYCODONE HCL 10 MG PO TABS
10.0000 mg | ORAL_TABLET | Freq: Four times a day (QID) | ORAL | 0 refills | Status: DC | PRN
Start: 2023-05-08 — End: 2023-05-20
  Filled 2023-05-08: qty 45, 8d supply, fill #0

## 2023-05-08 MED ORDER — GABAPENTIN 300 MG PO CAPS
300.0000 mg | ORAL_CAPSULE | Freq: Three times a day (TID) | ORAL | 0 refills | Status: DC
  Filled 2023-05-08: qty 90, 30d supply, fill #0

## 2023-05-08 NOTE — Progress Notes (Signed)
 Met with patient to review plan of care, team conferences ,review current situation. Reviewed medications. Reviewed medications  for pain, management of pain, management of bowels and sleep. Patient has a PCP and my chart. Continue to follow along to provide educational needs to facilitate preparation for discharge.

## 2023-05-08 NOTE — Progress Notes (Signed)
 Physical Therapy Discharge Summary  Patient Details  Name: Paul [name]. MRN: [id number] Date of Birth: April 08, 1945  Date of Discharge from PT service:{Time; dates multiple:[id number]}   Patient has met {NUMBERS 0-12:18577} of {NUMBERS 0-12:18577} long term goals due to improved activity tolerance, improved balance, increased strength, ability to compensate for deficits, improved attention, improved awareness, and improved coordination.  Patient to discharge at a wheelchair level with stand pivot transfer  set up assist/supervision .   Patient's care partner is independent to provide the necessary physical assistance at discharge.  Recommendation:  Patient will benefit from ongoing skilled PT services in outpatient setting once restrictions are lifted to continue to advance safe functional mobility, address ongoing impairments in strength/ROM/coordination, balance, gait, and minimize fall risk.  Equipment: WC, BSC, pt is purchasing ramp, RW, reacher, sock aid  Reasons for discharge: treatment goals met and discharge from hospital  Patient/family agrees with progress made and goals achieved: Yes  PT Discharge Precautions/Restrictions Precautions Precautions: Other (comment) (B quad tendon rupture) Precaution/Restrictions Comments: B bledsole braces locked in extension Required Braces or Orthoses: Other Brace (B LE bledsoe brace locked in 0 deg knee extension) Knee Immobilizer - Right: On at all times Knee Immobilizer - Left: On at all times Restrictions Weight Bearing Restrictions Per Provider Order: Yes RLE Weight Bearing Per Provider Order: Non weight bearing LLE Weight Bearing Per Provider Order: Non weight bearing Other Position/Activity Restrictions: BLE NWB with exception of stand-pivot transfers with RW ONLY Pain Interference Pain Interference Pain Effect on Sleep: 4. Almost constantly Pain Interference with Therapy Activities: 1. Rarely or not at all Pain Interference  with Day-to-Day Activities: 4. Almost constantly Vision/Perception  Vision - History Ability to See in Adequate Light: 1 Impaired Perception Perception: Within Functional Limits Praxis Praxis: WFL  Cognition Overall Cognitive Status: Within Functional Limits for tasks assessed Arousal/Alertness: Awake/alert Orientation Level: Oriented X4 Memory: Appears intact Awareness: Appears intact Problem Solving: Appears intact Safety/Judgment: Appears intact Sensation Sensation Light Touch: Appears Intact Hot/Cold: Appears Intact Proprioception: Appears Intact Stereognosis: Appears Intact Coordination Gross Motor Movements are Fluid and Coordinated: No Fine Motor Movements are Fluid and Coordinated: No Coordination and Movement Description: Limited by NWB precautions (outside of SPT), B bledsole bracing, and surgical pain Motor  Motor Motor: Other (comment) Motor - Skilled Clinical Observations: generalized weakness Motor - Discharge Observations: Limited by NWB precautions (outside of SPT), B bledsole bracing, and surgical pain  Mobility Bed Mobility Bed Mobility: Sit to Supine;Supine to Sit;Rolling Left;Rolling Right Rolling Right: Independent with assistive device Rolling Left: Independent with assistive device Supine to Sit: Independent with assistive device Sit to Supine: Independent with assistive device Transfers Transfers: Sit to Stand;Stand to Sit;Stand Pivot Transfers Sit to Stand: Independent with assistive device Stand to Sit: Independent with assistive device Stand Pivot Transfers: Set up assist Transfer (Assistive device): Rolling walker Locomotion  Gait Ambulation: No (WBAT for stand pivot transfer only, otherwise B LE NWB) Stairs / Additional Locomotion Stairs: No Wheelchair Mobility Wheelchair Mobility: Yes Wheelchair Assistance: Independent with Scientist, research (life sciences): Both upper extremities Wheelchair Parts Management:  Supervision/cueing Distance: 150  Trunk/Postural Assessment  Cervical Assessment Cervical Assessment: Within Functional Limits Thoracic Assessment Thoracic Assessment: Within Functional Limits Lumbar Assessment Lumbar Assessment: Within Functional Limits Postural Control Postural Control: Within Functional Limits  Balance Balance Balance Assessed: Yes Static Sitting Balance Static Sitting - Balance Support: Feet supported Static Sitting - Level of Assistance: 7: Independent Dynamic Sitting Balance Dynamic Sitting - Balance Support: During functional activity Dynamic  Sitting - Level of Assistance: 6: Modified independent (Device/Increase time) Dynamic Sitting - Balance Activities: Lateral lean/weight shifting;Forward lean/weight shifting;Reaching for objects Static Standing Balance Static Standing - Balance Support: Bilateral upper extremity supported;During functional activity Static Standing - Level of Assistance: 6: Modified independent (Device/Increase time) Dynamic Standing Balance Dynamic Standing - Balance Support: During functional activity Dynamic Standing - Level of Assistance: 5: Stand by assistance (set up assit/supervision) Extremity Assessment  RLE Assessment RLE Assessment: Exceptions to [facility] Passive Range of Motion (PROM) Comments: unable to formally assess knee secondary to KI, ankle WFLs, hip grossly 3/5 however fluctuates with pain LLE Assessment LLE Assessment: Exceptions to [facility] Passive Range of Motion (PROM) Comments: unable to formally assess knee secondary to KI, ankle WFLs, hip grossly 2/5 however fluctuates with pain   [facility], PT, DPT  05/08/2023, 4:21 PM

## 2023-05-08 NOTE — Progress Notes (Addendum)
 PROGRESS NOTE   Subjective/Complaints: Pain medications helping but wear off after 4 hours. Continued hiccups intermittently.   LBM 2/22  ROS: Patient denies fever, new vision changes, dizziness, nausea, vomiting, diarrhea,  shortness of breath or chest pain, headache, or mood change. + constipation-continued  + b/l leg pain  +hiccups    Objective:   No results found.  Recent Labs    05/06/23 0636  WBC 10.9*  HGB 13.9  HCT 40.4  PLT 342   No results for input(s): "NA", "K", "CL", "CO2", "GLUCOSE", "BUN", "CREATININE", "CALCIUM" in the last 72 hours.   Intake/Output Summary (Last 24 hours) at 05/08/2023 1214 Last data filed at 05/08/2023 1210 Gross per 24 hour  Intake 477 ml  Output 2724 ml  Net -2247 ml        Physical Exam: Vital Signs Blood pressure 116/72, pulse 75, temperature 98.1 F (36.7 C), resp. rate 18, height 6' (1.829 m), weight 89.8 kg, SpO2 97%.  Physical Exam Constitutional: No distress . Vital signs reviewed. Occasional hiccups HEENT: NCAT, EOMI, oral membranes moist Neck: supple Cardiovascular: RRR without murmur. No JVD    Respiratory/Chest: CTA Bilaterally without wheezes or rales. Normal effort    GI/Abdomen: BS +, non-tender, non-distended Ext: no clubbing, cyanosis, or edema Psych: pleasant and cooperative  musculoskeletal:     Cervical back: No tenderness.     Comments: Left KI applied incorrectly, with both bars under leg Skin:    Comments: Warm and dry, both wounds dressed, no visible drainage Neurological:     Mental Status: He is alert and oriented to person, place, and time.     Cranial Nerves: Cranial nerves 2-12 are intact.     Comments: Alert and oriented x 3. Normal insight and awareness. Intact Memory. Normal language and speech.  Nerves II through XII grossly intact. MMT: 5/5 BUE prox to distal. BLE remain limited by KI's, ortho.  sensory exam normal for light touch  and pain in all 4 limbs.  Able to move both ankles.  No limb ataxia or cerebellar signs. No abnormal tone appreciated.  Neuro exam normal 2/26       Assessment/Plan: 1. Functional deficits which require 3+ hours per day of interdisciplinary therapy in a comprehensive inpatient rehab setting. Physiatrist is providing close team supervision and 24 hour management of active medical problems listed below. Physiatrist and rehab team continue to assess barriers to discharge/monitor patient progress toward functional and medical goals  Care Tool:  Bathing    Body parts bathed by patient: Right arm, Left arm, Left upper leg, Face, Chest, Abdomen, Front perineal area, Right upper leg, Buttocks, Right lower leg, Left lower leg   Body parts bathed by helper: Buttocks Body parts n/a: Right lower leg, Left lower leg   Bathing assist Assist Level: Supervision/Verbal cueing (Use of long-handled sponge.)     Upper Body Dressing/Undressing Upper body dressing   What is the patient wearing?: Pull over shirt    Upper body assist Assist Level: Independent with assistive device    Lower Body Dressing/Undressing Lower body dressing      What is the patient wearing?: Underwear/pull up, Pants     Lower  body assist Assist for lower body dressing: Supervision/Verbal cueing (Max A for B bledsole bracing management)     Toileting Toileting    Toileting assist Assist for toileting: Supervision/Verbal cueing     Transfers Chair/bed transfer  Transfers assist     Chair/bed transfer assist level: Contact Guard/Touching assist     Locomotion Ambulation   Ambulation assist   Ambulation activity did not occur: Safety/medical concerns (B LE NWB)          Walk 10 feet activity   Assist  Walk 10 feet activity did not occur: Safety/medical concerns        Walk 50 feet activity   Assist Walk 50 feet with 2 turns activity did not occur: Safety/medical concerns         Walk  150 feet activity   Assist Walk 150 feet activity did not occur: Safety/medical concerns         Walk 10 feet on uneven surface  activity   Assist Walk 10 feet on uneven surfaces activity did not occur: Safety/medical concerns         Wheelchair     Assist Is the patient using a wheelchair?: Yes Type of Wheelchair: Manual    Wheelchair assist level: Supervision/Verbal cueing Max wheelchair distance: 150    Wheelchair 50 feet with 2 turns activity    Assist        Assist Level: Supervision/Verbal cueing   Wheelchair 150 feet activity     Assist      Assist Level: Supervision/Verbal cueing   Blood pressure 116/72, pulse 75, temperature 98.1 F (36.7 C), resp. rate 18, height 6' (1.829 m), weight 89.8 kg, SpO2 97%.   Medical Problem List and Plan: 1. Functional deficits secondary to bilateral quadricep tendon rupture with large subcutaneous hematoma.  Status post quadricep tendon repair 04/30/2023 ultrasound-guided aspiration of subcutaneous hematoma.  Bilateral knee immobilizers at all times nonweightbearing except okay to weightbearing as tolerated for pivot transfers.             -patient may shower             -ELOS/Goals: 7-10 days, supervision to min assist goals             -Continue CIR therapies including PT, OT   -Team conference today please see physician documentation under team conference tab, met with team  to discuss problems,progress, and goals. Formulized individual treatment plan based on medical history, underlying problem and comorbidities.   2.  Antithrombotics: -DVT/anticoagulation: Eliquis initiated 05/03/2023             -antiplatelet therapy: N/A 3. Pain Management: Robaxin and oxycodone as needed  -2/22- increased oxycodone to 10 to 15 mg every 4 hours as needed.   4. Mood/Behavior/Sleep: Wellbutrin 150 mg daily.  Provide emotional support             -antipsychotic agents: N/A 5. Neuropsych/cognition: This patient is  capable of making decisions on his own behalf. 6. Skin/Wound Care: Routine skin checks 7. Fluids/Electrolytes/Nutrition: labs reviewed 8.  New onset A-fib with RVR.  Patient responded to IV metoprolol transitioned to Lopressor 25 mg BID.   Eliquis initiated 05/03/2023.             -HR controlled and regular  -2/22 repeat EKG -remains in sinus rhythm  -2/26 HR stable  9.  Hypertension.   Monitor with increased mobility  -2/26, BP stable      05/08/2023    8:20 AM 05/08/2023  4:08 AM 05/07/2023    7:10 PM  Vitals with BMI  Weight  198 lbs   BMI  26.84   Systolic 116 128 161  Diastolic 72 89 67  Pulse 75 69 71    10.  BPH.  Proscar 5 mg daily.  Check PVR.  Denies difficulty voiding his bladder 11.  COPD with tobacco abuse.  Check oxygen saturations every shift.  Continue inhalers as directed.  Provide counseling 12.  Diastolic congestive heart failure.  Monitor for any signs of fluid overload.             -daily weights-stable to decreased  -No signs of fluid overload noted  Filed Weights   05/06/23 0457 05/07/23 0436 05/08/23 0408  Weight: 96.5 kg 90.4 kg 89.8 kg    13.  GERD.  Protonix 14.  Hyperlipidemia.  Mild rhabdo and lipitor held 15. Constipation              -LBM 2/17, order Sorbitol 45ml  -2/22 Mg citrate today, can try suppository if no results.  Will also add enema as needed severe constipation.  -2/22 BM   after enema. daily MiraLAX and Senokot help prevent opioid related constipation  -2/25 increase MiraLAX to twice daily, add magnesium oxide 400 mg.  Add sorbitol as needed  -2/26 Sorbitol 60ml, suppository  16.  Insomnia               -Restarted Ambien 5 mg as needed-he takes this at home 17.  Hiccups  -Had Thorazine 25 mg as needed during acute hospitalization  -2/22 will start gabapentin 100 mg 3 times daily  -12/24 gabapentin increased to 200mg  tid effective today. -obsv  -2/25 increase gabapentin to 300 mg 3 times daily  -2/26 continue current regimen  for now, he reports Thorazine did not provide much benefit.     Addendum: incisions with increased warmth and swelling, discussed with ortho who reviewed images, will add Keflex      LOS: 5 days A FACE TO FACE EVALUATION WAS PERFORMED  Fanny Dance 05/08/2023, 12:14 PM

## 2023-05-08 NOTE — Progress Notes (Signed)
 Occupational Therapy Session Note  Patient Details  Name: Paul [name]. MRN: [id number] Date of Birth: Nov 03, 1945  Today's Date: 05/08/2023 OT Individual Time: 0702-0815 OT Individual Time Calculation (min): 73 min   Short Term Goals: Week 1:  OT Short Term Goal 1 (Week 1): STGs=LTGs due to patient's estimated length of stay.  Skilled Therapeutic Interventions/Progress Updates:  Pt greeted resting in bed for skilled OT session with focus on BADL and discharge planning.   Pain: Pt reported 6/10 surgical pain, medications administered during session. OT offering intermediate rest breaks and positioning suggestions throughout session to address pain/fatigue and maximize participation/safety in session.   Functional Transfers: Bed mobility with use loop gait belt to assist LLE. Sit>stand from heightened bed to simulate home environment with supervision, stand-pivot from EOB>WC in similar fashion. Sit<>stands from [facility], including alternating unilateral support for LB clothing management with close supervision.   Self Care Tasks: Pt completes the following self care tasks with levels of assistance noted below, UB: Sink-side care with distant supervision progressing to Mod I.  LB: Pt requires Min A for lower leg bathing thoroughness, issued long-handled sponge. Assistance also provided for management of Bledsoes to allow bathing of legs. Pt aware spouse will need to assist to avoid bending knees and appropriate fit.   Pt able to recall information provided during caregiver education, including using BSC as shower seat. Pt and OT verbally review process for bathing at shower level vs sponge-bathing. Pt receptive, stating ". . . I think we have it all pretty much covered." Pt does continue to prefer sponge-bathing at discharge.   Education: Information provided in regards to non-rinse soap to improve efficiency with sponge-bathing.   Pt remained sitting in WC with 4Ps assessed and immediate  needs met. Pt continues to be appropriate for skilled OT intervention to promote further functional independence in ADLs/IADLs.   Therapy Documentation Precautions:  Precautions Precautions: Knee Precaution/Restrictions Comments: B knee immobilizers Required Braces or Orthoses: Knee Immobilizer - Right, Knee Immobilizer - Left Knee Immobilizer - Right: On at all times Knee Immobilizer - Left: On at all times Restrictions Weight Bearing Restrictions Per Provider Order: Yes RLE Weight Bearing Per Provider Order: Weight bearing as tolerated LLE Weight Bearing Per Provider Order: Weight bearing as tolerated Other Position/Activity Restrictions: Per orders : ok to weightbear for STPs only; per secure chat with Meghan, ortho PA, pt may do quad sets and SLR   Therapy/Group: Individual Therapy  Lou Cal, OTR/L, MSOT  05/08/2023, 4:49 AM

## 2023-05-08 NOTE — Progress Notes (Signed)
 Patient ID: Paul [name]., male   DOB: 12/30/1945, 78 y.o.   MRN: [id number]  Met with pt to give him the team conference update regarding meeting goals and family education completed so ready for discharge tomorrow. Have received his equipment and will let this worker know later regarding if transport needed tomorrow. Will know if ramp will be in place later today.

## 2023-05-08 NOTE — Progress Notes (Signed)
 MD notified of redness and warmth to area around incision bandages. Orders given.

## 2023-05-08 NOTE — Progress Notes (Signed)
 Physical Therapy Session Note  Patient Details  Name: Paul [name]. MRN: [id number] Date of Birth: 02-26-1946  Today's Date: 05/08/2023 PT Individual Time: 0847-1000, 1445-1530 PT Individual Time Calculation (min): 73 min, 45 min   Short Term Goals: Week 1:  PT Short Term Goal 1 (Week 1): STGs = LTGs  Skilled Therapeutic Interventions/Progress Updates:      Treatment Session 1  Pt seated in WC upon arrival. Pt has B LE positioned on elevating leg rests however in dependent positioning. Education provided on positioning with leg rests elevated for edema management. Pt verbalized understanding and agreeable.   Pt reports his shorts are too tight, pt stood with RW and mod I. Pt donned shorts with A to rip them off 2/2 too tight. Pt donned pants with total A for feeding through legs 2/2 reacher not in room. Pt able to provide teach back of need to use reacher at home. Pt donned pants over buttocks while standing with RW with mod I.   Pt performed stand pivot transfer hospital WC to pt new WC with RW and set up assist.   Therapist adjusted elevated leg rests to maximum length for proper positioning for pt. Attempted to adjust placement of pad under patients calf for improved comfort and positioning, however unable 2/2 to the mechanics of pt leg rests. Therapist mentioned having PA order egg crate foam to attach to leg rest for more surface area, and pt comfort and LE positioning. Pt refusing, and denies reports they are comfortable as is.   Pt self propelled WC x150 feet with mod I.   Pt performed stand pivot transfer WC to bed with RW and set up assist.  Sit to supine with use of gait belt for L LE and mod I.   Verified pt has his HEP handout. Pt denies any questions or concerns regarding his HEP. Plan to review next session.   Pt supine in bed at end of session with all needs within reach and bed alarm on.    Treatment Session 2   Pt supine in bed upon arrival. Pt agreeable to  therapy. Pt reports pain is managed with pain medications at this time. Nurse present to do bladder scan, and reports urinary retention. Pt reports difficulty peeing likely because he has only attempted while laying down in urinal.   Pt performed sit to stand with RW and mod I and attempted to urinate while standing in urinal with set up assist, however discontinued as pt opted to sit on [facility] instead for privacy. Pt performed stand pivot transfer bed to [facility] with supervision. Pt required increased time, but continent of bowel. For time and energy conservation, performed pericare with total A. Pt donned/doffed pants with supervision/mod I.   Pt performed stand pivot transfer BSC to bed with set up assist, and sit to supine with use of gait belt for L LE.   Pt reports increased redness in B knee this afternoon. Therapist assessed and also noted increased redness as well as warm to touch. Notified nurse and MD. Nurse present for further assessment at end of session.   Pt supine in bed with nurse in room.       Therapy Documentation Precautions:  Precautions Precautions: Other (comment) (B quad tendon ruptures) Precaution/Restrictions Comments: B bledsole braces locked in extension Required Braces or Orthoses: Knee Immobilizer - Right, Knee Immobilizer - Left Knee Immobilizer - Right: On at all times Knee Immobilizer - Left: On at all times Restrictions Weight  Bearing Restrictions Per Provider Order: Yes RLE Weight Bearing Per Provider Order: Non weight bearing LLE Weight Bearing Per Provider Order: Non weight bearing Other Position/Activity Restrictions: BLE NWB with exception of stand-pivot transfers ONLY  Therapy/Group: Individual Therapy  [facility] Ambrose Finland, [place], DPT  05/08/2023, 3:47 PM

## 2023-05-08 NOTE — Progress Notes (Signed)
 Occupational Therapy Discharge Summary  Patient Details  Name: Paul [name]. MRN: [id number] Date of Birth: [birth year]  Date of Discharge from OT service:May 08, 2023  Patient has met 3 of 3 long term goals due to improved activity tolerance, improved balance, postural control, ability to compensate for deficits, and improved awareness.  Patient to discharge at overall Supervision level.  Patient's care partner is independent to provide the necessary physical assistance at discharge.    Reasons goals not met: Pt will require A for management of B bledsole braces as part of LB dressing.   Recommendation:  No OT follow-up recommended at this time.   Equipment: WC, BSC, and Hip kit (including reacher)  Reasons for discharge: treatment goals met and discharge from hospital  Patient/family agrees with progress made and goals achieved: Yes  OT Discharge Precautions/Restrictions  Precautions Precautions: Other (comment) (B quad tendon ruptures) Precaution/Restrictions Comments: B bledsole braces locked in extension Restrictions Weight Bearing Restrictions Per Provider Order: Yes RLE Weight Bearing Per Provider Order: Non weight bearing LLE Weight Bearing Per Provider Order: Non weight bearing Other Position/Activity Restrictions: BLE NWB with exception of stand-pivot transfers ONLY ADL ADL Eating: Independent Where Assessed-Eating: Chair Grooming: Modified independent Where Assessed-Grooming: Sitting at sink Upper Body Bathing: Modified independent Where Assessed-Upper Body Bathing: Sitting at sink Lower Body Bathing: Supervision/safety Where Assessed-Lower Body Bathing: Sitting at sink, Edge of bed, Standing at sink Upper Body Dressing: Modified independent (Device) Where Assessed-Upper Body Dressing: Sitting at sink Lower Body Dressing: Supervision/safety Where Assessed-Lower Body Dressing: Sitting at sink, Standing at sink, Edge of bed Toileting:  Supervision/safety Where Assessed-Toileting: Bedside Commode, Actuary Transfer: Close supervision Toilet Transfer Method: Surveyor, minerals: Gaffer: Unable to assess Film/video editor: Close supervision Film/video editor Method: Warden/ranger: Other (comment), Transfer tub bench (Simulated built-in shower bench) Vision Baseline Vision/History: 1 Wears glasses (Readers) Vision Assessment?: No apparent visual deficits Perception  Perception: Within Functional Limits Praxis Praxis: WFL Cognition Cognition Overall Cognitive Status: Within Functional Limits for tasks assessed Arousal/Alertness: Awake/alert Memory: Appears intact Awareness: Appears intact Safety/Judgment: Appears intact Brief Interview for Mental Status (BIMS) Repetition of Three Words (First Attempt): 3 Temporal Orientation: Year: Correct Temporal Orientation: Month: Accurate within 5 days Temporal Orientation: Day: Correct Recall: "Sock": Yes, no cue required Recall: "Blue": Yes, no cue required Recall: "Bed": Yes, no cue required BIMS Summary Score: 15 Sensation Sensation Light Touch: Appears Intact Hot/Cold: Appears Intact Proprioception: Appears Intact Stereognosis: Appears Intact Coordination Gross Motor Movements are Fluid and Coordinated: No Fine Motor Movements are Fluid and Coordinated: Yes Coordination and Movement Description: Limited by NWB precautions (outside of SPT), B bledsole bracing, and surgical pain Motor  Motor Motor: Other (comment) Motor - Discharge Observations: Limited by NWB precautions (outside of SPT), B bledsole bracing, and surgical pain Mobility  Transfers Sit to Stand: Supervision/Verbal cueing Stand to Sit: Supervision/Verbal cueing  Trunk/Postural Assessment  Cervical Assessment Cervical Assessment: Within Functional Limits Thoracic Assessment Thoracic Assessment: Within  Functional Limits Lumbar Assessment Lumbar Assessment: Within Functional Limits Postural Control Postural Control: Within Functional Limits  Balance Balance Balance Assessed: Yes Static Sitting Balance Static Sitting - Balance Support: Feet supported Static Sitting - Level of Assistance: 7: Independent Dynamic Sitting Balance Dynamic Sitting - Balance Support: During functional activity Dynamic Sitting - Level of Assistance: 6: Modified independent (Device/Increase time) Dynamic Sitting - Balance Activities: Lateral lean/weight shifting;Forward lean/weight shifting;Reaching for objects Static Standing Balance Static Standing - Balance  Support: Bilateral upper extremity supported;During functional activity Static Standing - Level of Assistance: 5: Stand by assistance (SUP) Dynamic Standing Balance Dynamic Standing - Balance Support: During functional activity Dynamic Standing - Level of Assistance: 5: Stand by assistance (SUP) Dynamic Standing - Balance Activities: Lateral lean/weight shifting Extremity/Trunk Assessment RUE Assessment RUE Assessment: Within Functional Limits General Strength Comments: Prior RTC repair LUE Assessment LUE Assessment: Within Functional Limits   Lou Cal, OTR/L, MSOT  05/08/2023, 5:41 AM

## 2023-05-08 NOTE — Plan of Care (Signed)
  Problem: RH Bathing Goal: LTG Patient will bathe all body parts with assist levels (OT) Description: LTG: Patient will bathe all body parts with assist levels (OT) Outcome: Completed/Met   Problem: RH Dressing Goal: LTG Patient will perform lower body dressing w/assist (OT) Description: LTG: Patient will perform lower body dressing with assist, with/without cues in positioning using equipment (OT) Outcome: Adequate for Discharge Note: Pt will require A for management of B bledsole braces.    Problem: RH Toileting Goal: LTG Patient will perform toileting task (3/3 steps) with assistance level (OT) Description: LTG: Patient will perform toileting task (3/3 steps) with assistance level (OT)  Outcome: Completed/Met   Problem: RH Toilet Transfers Goal: LTG Patient will perform toilet transfers w/assist (OT) Description: LTG: Patient will perform toilet transfers with assist, with/without cues using equipment (OT) Outcome: Completed/Met   Problem: RH Tub/Shower Transfers Goal: LTG Patient will perform tub/shower transfers w/assist (OT) Description: LTG: Patient will perform tub/shower transfers with assist, with/without cues using equipment (OT) Outcome: Adequate for Discharge Note: Pt discharging at overall supervision-level for functional transfers, however patient preference is to sponge-bathe at home.

## 2023-05-09 ENCOUNTER — Other Ambulatory Visit (HOSPITAL_COMMUNITY): Payer: Self-pay

## 2023-05-09 MED ORDER — NALOXONE HCL 4 MG/0.1ML NA LIQD
NASAL | 1 refills | Status: DC
  Filled 2023-05-09: qty 2, 2d supply, fill #0

## 2023-05-09 MED ORDER — ACETAMINOPHEN 325 MG PO TABS: 325.0000 mg | ORAL_TABLET | Freq: Four times a day (QID) | ORAL | Status: DC | PRN

## 2023-05-09 NOTE — Progress Notes (Addendum)
 PROGRESS NOTE   Subjective/Complaints: Patient looking forward to discharge today.  Had bowel movement yesterday.  Redness around his surgical dressings has improved.  Hiccups are overall improving.  ROS: Patient denies fever, new vision changes, dizziness, nausea, vomiting, diarrhea,  shortness of breath or chest pain, headache, or mood change. + constipation-improved + b/l leg pain  +hiccups-improving    Objective:   No results found.  Recent Labs    05/08/23 1640  WBC 10.2  HGB 14.8  HCT 43.3  PLT 379   Recent Labs    05/08/23 1640  NA 133*  K 4.3  CL 101  CO2 24  GLUCOSE 99  BUN 19  CREATININE 0.94  CALCIUM 8.4*     Intake/Output Summary (Last 24 hours) at 05/09/2023 1658 Last data filed at 05/09/2023 0437 Gross per 24 hour  Intake 240 ml  Output 1350 ml  Net -1110 ml        Physical Exam: Vital Signs Blood pressure 123/68, pulse 64, temperature 97.9 F (36.6 C), temperature source Oral, resp. rate 18, height 6' (1.829 m), weight 87.3 kg, SpO2 98%.  Physical Exam Constitutional: No distress . Vital signs reviewed. Occasional hiccups HEENT: NCAT, EOMI, oral membranes moist Neck: supple Cardiovascular: RRR without murmur. No JVD    Respiratory/Chest: CTA Bilaterally without wheezes or rales. Normal effort    GI/Abdomen: BS +, non-tender, non-distended Ext: no clubbing, cyanosis, or edema Psych: pleasant and cooperative  musculoskeletal:     Cervical back: No tenderness.     Comments: Left KI applied incorrectly, with both bars under leg Skin:    Comments: Warm and dry, both wounds dressed, no visible drainage. No significant redness or warmth around dressings today Neurological:     Mental Status: He is alert and oriented to person, place, and time.     Cranial Nerves: Cranial nerves 2-12 are intact.     Comments: Alert and oriented x 3. Normal insight and awareness. Intact Memory. Normal  language and speech.  Nerves II through XII grossly intact. MMT: 5/5 BUE prox to distal. BLE remain limited by KI's, ortho.  sensory exam normal for light touch and pain in all 4 limbs.  Able to move both ankles.  No limb ataxia or cerebellar signs. No abnormal tone appreciated.  Neuro exam normal 2/27       Assessment/Plan: 1. Functional deficits which require 3+ hours per day of interdisciplinary therapy in a comprehensive inpatient rehab setting. Physiatrist is providing close team supervision and 24 hour management of active medical problems listed below. Physiatrist and rehab team continue to assess barriers to discharge/monitor patient progress toward functional and medical goals  Care Tool:  Bathing    Body parts bathed by patient: Right arm, Left arm, Left upper leg, Face, Chest, Abdomen, Front perineal area, Right upper leg, Buttocks, Right lower leg, Left lower leg   Body parts bathed by helper: Buttocks Body parts n/a: Right lower leg, Left lower leg   Bathing assist Assist Level: Supervision/Verbal cueing (Use of long-handled sponge.)     Upper Body Dressing/Undressing Upper body dressing   What is the patient wearing?: Pull over shirt    Upper body  assist Assist Level: Independent with assistive device    Lower Body Dressing/Undressing Lower body dressing      What is the patient wearing?: Underwear/pull up, Pants     Lower body assist Assist for lower body dressing: Supervision/Verbal cueing (Max A for B bledsole bracing management)     Toileting Toileting    Toileting assist Assist for toileting: Supervision/Verbal cueing     Transfers Chair/bed transfer  Transfers assist     Chair/bed transfer assist level: Set up assist     Locomotion Ambulation   Ambulation assist   Ambulation activity did not occur: Safety/medical concerns (B LE NWB with exception of stand pivot transfer)          Walk 10 feet activity   Assist  Walk 10 feet  activity did not occur: Safety/medical concerns        Walk 50 feet activity   Assist Walk 50 feet with 2 turns activity did not occur: Safety/medical concerns         Walk 150 feet activity   Assist Walk 150 feet activity did not occur: Safety/medical concerns         Walk 10 feet on uneven surface  activity   Assist Walk 10 feet on uneven surfaces activity did not occur: Safety/medical concerns         Wheelchair     Assist Is the patient using a wheelchair?: Yes Type of Wheelchair: Manual    Wheelchair assist level: Independent Max wheelchair distance: 150    Wheelchair 50 feet with 2 turns activity    Assist        Assist Level: Independent   Wheelchair 150 feet activity     Assist      Assist Level: Independent   Blood pressure 123/68, pulse 64, temperature 97.9 F (36.6 C), temperature source Oral, resp. rate 18, height 6' (1.829 m), weight 87.3 kg, SpO2 98%.   Medical Problem List and Plan: 1. Functional deficits secondary to bilateral quadricep tendon rupture with large subcutaneous hematoma.  Status post quadricep tendon repair 04/30/2023 ultrasound-guided aspiration of subcutaneous hematoma.  Bilateral knee immobilizers at all times nonweightbearing except okay to weightbearing as tolerated for pivot transfers.             -patient may shower             -ELOS/Goals: 7-10 days, supervision to min assist goals             -Continue CIR therapies including PT, OT   -DC home today  -2/27 patient noted to have some increased redness around his surgical dressings yesterday.  Images reviewed with PA Clydene Pugh yesterday.  Not felt to be overly concerning however Keflex started to be on the safe side.  Area appears improved today.-Follow-up with orthopedics outpatient to recheck 2.  Antithrombotics: -DVT/anticoagulation: Eliquis initiated 05/03/2023             -antiplatelet therapy: N/A 3. Pain Management: Robaxin and oxycodone as  needed  -2/22- increased oxycodone to 10 to 15 mg every 4 hours as needed.   4. Mood/Behavior/Sleep: Wellbutrin 150 mg daily.  Provide emotional support             -antipsychotic agents: N/A 5. Neuropsych/cognition: This patient is capable of making decisions on his own behalf. 6. Skin/Wound Care: Routine skin checks 7. Fluids/Electrolytes/Nutrition: labs reviewed 8.  New onset A-fib with RVR.  Patient responded to IV metoprolol transitioned to Lopressor 25 mg BID.  Eliquis initiated 05/03/2023.             -HR controlled and regular  -2/22 repeat EKG -remains in sinus rhythm  -2/27 heart rate remains overall stable 9.  Hypertension.   Monitor with increased mobility  -2/27 BP stable, no changes today     05/09/2023    7:35 AM 05/09/2023    4:30 AM 05/08/2023    7:15 PM  Vitals with BMI  Weight  192 lbs 7 oz   BMI  26.1   Systolic 123 138 161  Diastolic 68 76 66  Pulse 64 56 68    10.  BPH.  Proscar 5 mg daily.  Check PVR.  Denies difficulty voiding his bladder  -2/27 patient noted to have some mildly increased PVRs, has not needed catheterization.  Patient says he was able to empty his bladder much better after he tried voiding in a more upright position. 11.  COPD with tobacco abuse.  Check oxygen saturations every shift.  Continue inhalers as directed.  Provide counseling 12.  Diastolic congestive heart failure.  Monitor for any signs of fluid overload.             -daily weights-stable to decreased  -Weight stable to a little decreased today 2/27  Filed Weights   05/07/23 0436 05/08/23 0408 05/09/23 0430  Weight: 90.4 kg 89.8 kg 87.3 kg    13.  GERD.  Protonix 14.  Hyperlipidemia.  Mild rhabdo and lipitor held 15. Constipation              -LBM 2/17, order Sorbitol 45ml  -2/22 Mg citrate today, can try suppository if no results.  Will also add enema as needed severe constipation.  -2/22 BM   after enema. daily MiraLAX and Senokot help prevent opioid related  constipation  -2/25 increase MiraLAX to twice daily, add magnesium oxide 400 mg.  Add sorbitol as needed  -2/26 Sorbitol 60ml, suppository   2/27 LBM yesterday-improved 16.  Insomnia               -Restarted Ambien 5 mg as needed-he takes this at home 17.  Hiccups  -Had Thorazine 25 mg as needed during acute hospitalization  -2/22 will start gabapentin 100 mg 3 times daily  -12/24 gabapentin increased to 200mg  tid effective today. -obsv  -2/25 increase gabapentin to 300 mg 3 times daily  -2/26 continue current regimen for now, he reports Thorazine did not provide much benefit.  -2/27 add as needed baclofen     Addendum: incisions with increased warmth and swelling, discussed with ortho who reviewed images, will add Keflex      LOS: 6 days A FACE TO FACE EVALUATION WAS PERFORMED  Fanny Dance 05/09/2023, 4:58 PM

## 2023-05-09 NOTE — Patient Care Conference (Signed)
 Inpatient RehabilitationTeam Conference and Plan of Care Update Date: 05/08/2023   Time: 1214 pm    Patient Name: Paul [name].      Medical Record Number: [id number]  Date of Birth: 1946/03/08 Sex: Male         Room/Bed: 4W25C/4W25C-01 Payor Info: Payor: BLUE CROSS BLUE [employer]IELD / Plan: BCBS COMM PPO / Product Type: *No Product type* /    Admit Date/Time:  05/03/2023  4:13 PM  Primary Diagnosis:  Quadriceps tendon rupture  Hospital Problems: Principal Problem:   Quadriceps tendon rupture    Expected Discharge Date: Expected Discharge Date: 05/09/23  Team Members Present: Physician leading conference: Dr. Fanny Dance Social Worker Present: Dossie Der, LCSW Nurse Present: Konrad Dolores, RN PT Present: Ambrose Finland, PT OT Present: Lou Cal, OT PPS Coordinator present : Fae Pippin, SLP     Current Status/Progress Goal Weekly Team Focus  Bowel/Bladder   Patient is continent of Bowel and Bladder, LBM 05/04/23, currently on a bowel regime   Maintain regularity with bowels, and bladder   Assess QS/PRN for signs constipation,or irregularity monitor daily    Swallow/Nutrition/ Hydration               ADL's   Setup/supervision for ADLs, excluding A required for management of B bledsole bracing. Pt prefers to sponge-bathe at discharge, although shower transfer and bledsole management verbally reviewed.   Setup/Supervision   Discharge planning    Mobility   at goal level, family training completed 2/26 with pt wife, DME delivered to room   mod I/sup  D/C 2/27, follow up OPPT when WB restrictions    Communication                Safety/Cognition/ Behavioral Observations               Pain   patient rate his surgical pain to bilateral LE from 6-9 and currently recieving Ocycodone 10-15mg  Q4hrs in addition to Tylenol,Robaxin,   Pain goal per patient 3/10   Assess Pain Q2-4hrs and prn, with follow-up    Skin   Bilateral surgical knee  dressing in place with no noed drainage or bleeding, , Bledsoe Braces in place, other wise skin appears intact   Prevention of infection and promoting health healing to surgical site  Assess skin and bilateral  incision sites QS and prn,      Discharge Planning:  HOme with wife who was here yesterday and did actual car transfer. Both feel prepared to go home tomorrow. Ramp may be in place or not and will do non-emergency transport if needed   Team Discussion: Patient was admitted post bilateral quadricep tendon rupture with large subcutaneous hematoma and  a new onset of Atrial Fibrillation. Patient limited by incisional pain and hiccups:medications adjusted by MD.  Patient on target to meet rehab goals:  yes, Patient on target to meet his discharge goals set at supervision overall.  *See Care Plan and progress notes for long and short-term goals.   Revisions to Treatment Plan:  N/a   Teaching Needs: Safety, medications, bledsoe brace wear and care, transfers, weight bearing restrictions , toileting, etc.   Current Barriers to Discharge: Decreased caregiver support, Home enviroment access/layout, and Weight bearing restrictions  Possible Resolutions to Barriers: Family education Outpatient follow up after cleared for Weight bearing restrictions DME: W/C     Medical Summary Current Status: bilateral quadricep tendon rupture,Afib, HTN, CHF, constipaton, hiccups  Barriers to Discharge: Cardiac Complications;Medical stability;Self-care education;Uncontrolled Pain  Barriers to Discharge Comments: bilateral quadricep tendon rupture,Afib, HTN, CHF, constipaton, hiccups Possible Resolutions to [employer]: continue pain medications, gabapentin for hicups, monitor wt and BP, monitor HR   Continued Need for Acute Rehabilitation Level of Care: The patient requires daily medical management by a physician with specialized training in physical medicine and rehabilitation for the  following reasons: Direction of a multidisciplinary physical rehabilitation program to maximize functional independence : Yes Medical management of patient stability for increased activity during participation in an intensive rehabilitation regime.: Yes Analysis of laboratory values and/or radiology reports with any subsequent need for medication adjustment and/or medical intervention. : Yes   I attest that I was present, lead the team conference, and concur with the assessment and plan of the team.   Konrad Dolores [facility] 05/09/2023, 7:08 AM

## 2023-05-09 NOTE — Progress Notes (Signed)
 Inpatient Rehabilitation Care Coordinator Discharge Note   Patient Details  Name: Paul [name]. MRN: [id number] Date of Birth: [birth year]   Discharge location: HOME WITH WIFE WHO CAN PROVDIE 24/7 SUPERVISION  Length of Stay: 6 DAYS  Discharge activity level: MOD/I-SUPERVISION WITH TRANSFERS DUE TO NWB ISSUES  Home/community participation: ACTIVE  Patient response WU:[id number] Literacy - How often do you need to have someone help you when you read instructions, pamphlets, or other written material from your doctor or pharmacy?: Never  Patient response YN:WGNFAO Isolation - How often do you feel lonely or isolated from those around you?: Never  Services provided included: MD, RD, PT, OT, RN, CM, Pharmacy, SW  Financial Services:  Field seismologist Utilized: Barrister's clerk  Choices offered to/list presented to: PT AND WIFE  Follow-up services arranged:  DME, Patient/Family has no preference for HH/DME agencies      DME : [facility] AND 3 IN 1   WILL NEED OPPT ONCE CAN WB ON HIS LEGS. Patient response to transportation need: Is the patient able to respond to transportation needs?: Yes In the past 12 months, has lack of transportation kept you from medical appointments or from getting medications?: No In the past 12 months, has lack of transportation kept you from meetings, work, or from getting things needed for daily living?: No   Patient/Family verbalized understanding of follow-up arrangements:  Yes  Individual responsible for coordination of the follow-up plan: SELF AND Paul [name]  [phone]  Confirmed correct DME delivered: Paul [name] 05/09/2023    Comments (or additional information):WIFE WAS IN FOR ACTUAL CAR TRANSFER AND EDUCATION ON TRANSFERS. BOTH FEEL COMFORTABLE WITH DC HOME  Summary of Stay    Date/Time Discharge Planning CSW  05/08/23 [phone] HOme with wife who was here yesterday and did actual car transfer. Both feel  prepared to go home tomorrow. Ramp may be in place or not and will do non-emergency transport if needed RGD       Paul [name], Paul [name]

## 2023-05-09 NOTE — Progress Notes (Signed)
 Inpatient Rehabilitation Discharge Medication Review by a Pharmacist  A complete drug regimen review was completed for this patient to identify any potential clinically significant medication issues.  High Risk Drug Classes Is patient taking? Indication by Medication  Antipsychotic No   Anticoagulant Yes Apixaban - atrial fibrillation  Antibiotic Yes Cephalexin - cellulitis coverage  Opioid Yes PRN Oxycodone - severe pain  Antiplatelet No   Hypoglycemics/insulin No   Vasoactive Medication Yes Metoprolol - hypertension, atrial fibrillation  Chemotherapy No   Other Yes Bupropion XL - mood stabilization Finasteride - BPH Gabapentin - neuropathic pain Pantoprazole - GERD Magnesium oxide, Vitamin D - supplements Miralax, Senna-docusate - laxatives Symbicort - COPD Testosterone cypionate - hypogonadism  PRNs:  Acetaminophen - mild pain or temp >100.44F Albuterol inhaler - wheezing, shortness of breath Baclofen - hiccups Methocarbamol - muscle spasms Zolpidem - sleep     Type of Medication Issue Identified Description of Issue Recommendation(s)  Drug Interaction(s) (clinically significant)     Duplicate Therapy     Allergy     No Medication Administration End Date     Incorrect Dose     Additional Drug Therapy Needed     Significant med changes from prior encounter (inform family/care partners about these prior to discharge). Valsartan discontinued. Also discontinued Atorvastatin (reported not taking, not resumed due to mild rhabdomyolysis).  New Apixaban, cephalexin, gabapentin, magnesium, senna-docusate; prn baclofen, methocarbamol, oxycodone Communicate changes with patient/family prior to discharge.  Other  Dulera was substituted for Symbicort while inpatient. Back to Symbicort at discharge.     Clinically significant medication issues were identified that warrant physician communication and completion of prescribed/recommended actions by midnight of the next day:   No  Pharmacist comments: - Cephalexin to complete 7 day course.  Time spent performing this drug regimen review (minutes):  [address]  Dennie Fetters, [place] 05/09/2023 9:23 AM

## 2023-05-09 NOTE — Plan of Care (Signed)
  Problem: Consults Goal: RH GENERAL PATIENT EDUCATION Description: See Patient Education module for education specifics. Outcome: Progressing   Problem: RH BOWEL ELIMINATION Goal: RH STG MANAGE BOWEL WITH ASSISTANCE Description: STG Manage Bowel with toileting Assistance. Outcome: Progressing Goal: RH STG MANAGE BOWEL W/MEDICATION W/ASSISTANCE Description: STG Manage Bowel with Medication with mod I Assistance. Outcome: Progressing   Problem: RH BLADDER ELIMINATION Goal: RH STG MANAGE BLADDER WITH ASSISTANCE Description: STG Manage Bladder With toileting Assistance Outcome: Progressing Goal: RH STG MANAGE BLADDER WITH MEDICATION WITH ASSISTANCE Description: STG Manage Bladder With Medication With mod I Assistance. Outcome: Progressing   Problem: RH SKIN INTEGRITY Goal: RH STG ABLE TO PERFORM INCISION/WOUND CARE W/ASSISTANCE Description: STG Able To Perform Incision/Wound Care With min  Assistance. Outcome: Progressing   Problem: RH SAFETY Goal: RH STG ADHERE TO SAFETY PRECAUTIONS W/ASSISTANCE/DEVICE Description: STG Adhere to Safety Precautions With cues Assistance/Device. Outcome: Progressing   Problem: RH PAIN MANAGEMENT Goal: RH STG PAIN MANAGED AT OR BELOW PT'S PAIN GOAL Description: Pain < 4 with prns Outcome: Progressing   Problem: RH KNOWLEDGE DEFICIT GENERAL Goal: RH STG INCREASE KNOWLEDGE OF SELF CARE AFTER HOSPITALIZATION Description: Patient and wife will be able to manage care at discharge using educational resources for medications, skin care, dietary modification, etc. independently Outcome: Progressing

## 2023-05-09 NOTE — Plan of Care (Signed)
  Problem: Sit to Stand Goal: LTG:  Patient will perform sit to stand with assistance level (PT) Description: LTG:  Patient will perform sit to stand with assistance level (PT) Outcome: Adequate for Discharge   Problem: RH Balance Goal: LTG Patient will maintain dynamic standing balance (PT) Description: LTG:  Patient will maintain dynamic standing balance with assistance during mobility activities (PT) Outcome: Completed/Met   Problem: RH Bed Mobility Goal: LTG Patient will perform bed mobility with assist (PT) Description: LTG: Patient will perform bed mobility with assistance, with/without cues (PT). Outcome: Completed/Met   Problem: RH Bed to Chair Transfers Goal: LTG Patient will perform bed/chair transfers w/assist (PT) Description: LTG: Patient will perform bed to chair transfers with assistance (PT). Outcome: Completed/Met   Problem: RH Car Transfers Goal: LTG Patient will perform car transfers with assist (PT) Description: LTG: Patient will perform car transfers with assistance (PT). Outcome: Completed/Met   Problem: RH Wheelchair Mobility Goal: LTG Patient will propel w/c in controlled environment (PT) Description: LTG: Patient will propel wheelchair in controlled environment, # of feet with assist (PT) Outcome: Completed/Met Goal: LTG Patient will propel w/c in home environment (PT) Description: LTG: Patient will propel wheelchair in home environment, # of feet with assistance (PT). Outcome: Completed/Met Goal: LTG Patient will propel w/c in community environment (PT) Description: LTG: Patient will propel wheelchair in community environment, # of feet with assist (PT) Outcome: Completed/Met

## 2023-05-09 NOTE — Progress Notes (Signed)
 INPATIENT REHABILITATION DISCHARGE NOTE   Late entry  Discharge instructions by: Marissa Nestle  Verbalized understanding: Yes  Skin care/Wound care healing? Yes  Pain: Medicated   IV's: None   Tubes/Drains: none  O2: none  Safety instructions: reviewed with pt and spouse  Patient belongings: sent with pt  Discharged to: home  Discharged via: Family transport  Notes: done   [facility] Barbee Mamula

## 2023-05-13 ENCOUNTER — Telehealth: Payer: Self-pay | Admitting: Internal Medicine

## 2023-05-13 NOTE — Telephone Encounter (Signed)
 Copied from CRM [id number]. Topic: Referral - Question >> May 13, 2023 12:46 PM Eunice Blase wrote: Reason for CRM: Received call from [facility] per Lowella Bandy, ph: [phone] want to know if you meant for referral to go to clinic or Durenda Age because she is at [facility].

## 2023-05-13 NOTE — Telephone Encounter (Signed)
 I am sorry.  Yes, with Marlowe Kays, NP Thx

## 2023-05-15 DIAGNOSIS — M7041 Prepatellar bursitis, right knee: Secondary | ICD-10-CM | POA: Diagnosis not present

## 2023-05-20 ENCOUNTER — Encounter: Payer: Self-pay | Admitting: Internal Medicine

## 2023-05-20 ENCOUNTER — Ambulatory Visit: Payer: BC Managed Care – PPO | Admitting: Internal Medicine

## 2023-05-20 VITALS — BP 140/80 | HR 62 | Temp 98.2°F | Ht 72.0 in

## 2023-05-20 DIAGNOSIS — M5441 Lumbago with sciatica, right side: Secondary | ICD-10-CM | POA: Diagnosis not present

## 2023-05-20 DIAGNOSIS — S76119D Strain of unspecified quadriceps muscle, fascia and tendon, subsequent encounter: Secondary | ICD-10-CM

## 2023-05-20 DIAGNOSIS — I4891 Unspecified atrial fibrillation: Secondary | ICD-10-CM | POA: Diagnosis not present

## 2023-05-20 DIAGNOSIS — R609 Edema, unspecified: Secondary | ICD-10-CM | POA: Diagnosis not present

## 2023-05-20 DIAGNOSIS — G8929 Other chronic pain: Secondary | ICD-10-CM

## 2023-05-20 DIAGNOSIS — M5442 Lumbago with sciatica, left side: Secondary | ICD-10-CM

## 2023-05-20 MED ORDER — FUROSEMIDE 20 MG PO TABS: 20.0000 mg | ORAL_TABLET | Freq: Every day | ORAL | 1 refills | Status: DC | PRN

## 2023-05-20 MED ORDER — HYDROCODONE-ACETAMINOPHEN 10-325 MG PO TABS: 1.0000 | ORAL_TABLET | Freq: Three times a day (TID) | ORAL | 0 refills | Status: DC | PRN

## 2023-05-20 NOTE — Progress Notes (Signed)
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 92% room air Constitutional.  No acute distress HEENT Head.  Normocephalic and atraumatic Eyes.  Pupils round and reactive to light no discharge without nystagmus Neck.  Supple nontender no JVD without thyromegaly Cardiac regular rate and rhythm without any extra sounds or murmur heard Abdomen.  Soft nontender positive bowel sounds without rebound Respiratory effort normal no respiratory distress without wheeze Skin.  Warm and dry Musculoskeletal.  Bilateral knee immobilizers in place Neurologic.  Alert and oriented x 3.  Manual muscle testing 5/5 bilateral upper extremities proximal to distal bilateral lower extremities limited by KI.  Right hip flexion 4 out of 5 left hip flexion 4 out of 5 ankle plantar dorsiflexion 5 out of 5.   He/She  has had improvement in activity tolerance, balance, postural control as well as ability to compensate for deficits. He/She has had improvement in functional use RUE/LUE  and RLE/LLE as well as improvement in awareness.  Supervision to propel wheelchair.  Stand pivot transfer  wheelchair to bed supervision.  Sit to supine with supervision.  Upper body ADLs sink side sponge bathing with distant supervision.  Lower body contact-guard for standing PERI care and management of shorts.  Full family teaching completed plan discharge to home           Disposition: 06 Home with home health       Diet: Regular   Special Instructions: No driving smoking or alcohol   Bilateral knee Bledsoe braces at all times.  Nonweightbearing bilateral extremities weightbearing as tolerated for pivot transfers only"  Outpatient Medications Prior to Visit  Medication Sig Dispense Refill   acetaminophen (TYLENOL) 325 MG tablet Take 1-2 tablets (325-650 mg total) by mouth every 6 (six) hours as needed for mild pain (pain score 1-3).     apixaban (ELIQUIS) 5 MG TABS tablet Take 1 tablet (5 mg total) by mouth 2 (two) times daily. 60 tablet 0   atorvastatin (LIPITOR) 10 MG tablet Take 10 mg by mouth daily.     Baclofen 5 MG TABS Take 1 tablet (5 mg total) by mouth 3 (three) times daily as needed (hiccups). 10 tablet 0   budesonide-formoterol (SYMBICORT) 80-4.5 MCG/ACT inhaler Inhale 2 puffs into the lungs 2 (two) times daily. 1 each 12   buPROPion (WELLBUTRIN XL) 150 MG 24 hr tablet Take 1 tablet (150 mg total) by mouth daily. 30 tablet 0   cholecalciferol (VITAMIN D3) 25 MCG (1000 UNIT) tablet Take 1,000 Units by mouth daily.     finasteride (PROSCAR) 5 MG tablet Take 1 tablet by mouth daily. 90 tablet 1   Homeopathic Products [facility] MENTAL FOCUS PO) Take 1 tablet by mouth daily.     magnesium oxide (MAG-OX) 400 (240 Mg) MG tablet Take 1 tablet (400 mg total) by mouth daily. 30 tablet 0   methocarbamol (ROBAXIN) 500 MG tablet Take 1 tablet (500 mg total) by mouth every 6 (six) hours as needed for muscle spasms. 45 tablet 0   metoprolol tartrate (LOPRESSOR) 25 MG tablet Take 1 tablet (25 mg total) by mouth 2 (two) times daily. 60 tablet 0   Multiple Vitamin (MULTIVITAMIN WITH MINERALS)  TABS tablet Take 1 tablet by mouth daily.     naloxone (NARCAN) nasal spray 4 mg/0.1 mL USE AS NEEDED IN CASE OF OVERDOSE 2 each 1   pantoprazole (PROTONIX) 40  MG tablet Take 1 tablet (40 mg total) by mouth daily. Annual appt due in Oct must see provider for future refills 30 tablet 0   polyethylene glycol (MIRALAX / GLYCOLAX) 17 g packet Take 17 g by mouth 2 (two) times daily.     predniSONE (DELTASONE) 10 MG tablet Take 10 mg by mouth.     senna-docusate (SENOKOT-S) 8.6-50 MG tablet Take 2 tablets by mouth at bedtime.     Spacer/Aero-Holding Rudean Curt Use with Symbicort inhaler 1 each 1   triamterene-hydrochlorothiazide (MAXZIDE-25) 37.5-25 MG tablet Take 1 tablet by mouth daily.     zolpidem (AMBIEN) 10 MG tablet TAKE 1 TABLET BY MOUTH AT BEDTIME AS NEEDED 90 tablet 1   albuterol (VENTOLIN HFA) 108 (90 Base) MCG/ACT inhaler Inhale 2 puffs into the lungs every 6 (six) hours as needed for wheezing or shortness of breath. 8 g 6   cephALEXin (KEFLEX) 250 MG capsule Take 1 capsule (250 mg total) by mouth every 6 (six) hours. 26 capsule 0   gabapentin (NEURONTIN) 300 MG capsule Take 1 capsule (300 mg total) by mouth 3 (three) times daily. 90 capsule 0   Oxycodone HCl 10 MG TABS Take 1-1.5 tablets (10-15 mg total) by mouth every 6 (six) hours as needed for severe pain (pain score 7-10). 45 tablet 0   testosterone cypionate (DEPOTESTOSTERONE CYPIONATE) 200 MG/ML injection ADMINISTER 0.5 ML(100 MG) IN THE MUSCLE 1 TIME A WEEK 10 mL 1   No facility-administered medications prior to visit.    ROS: Review of Systems  Constitutional:  Negative for appetite change, fatigue and unexpected weight change.  HENT:  Negative for congestion, nosebleeds, sneezing, sore throat and trouble swallowing.   Eyes:  Negative for itching and visual disturbance.  Respiratory:  Negative for cough.   Cardiovascular:  Negative for chest pain, palpitations and leg swelling.  Gastrointestinal:  Negative for abdominal  distention, blood in stool, diarrhea and nausea.  Genitourinary:  Negative for frequency and hematuria.  Musculoskeletal:  Positive for arthralgias and gait problem. Negative for back pain, joint swelling and neck pain.  Skin:  Negative for rash.  Neurological:  Positive for weakness. Negative for dizziness, tremors and speech difficulty.  Hematological:  Does not bruise/bleed easily.  Psychiatric/Behavioral:  Negative for agitation, dysphoric mood, sleep disturbance and suicidal ideas. The patient is not nervous/anxious.     Objective:  BP (!) 140/80   Pulse 62   Temp 98.2 F (36.8 C) (Oral)   Ht 6' (1.829 m)   SpO2 97%   BMI 26.10 kg/m   BP Readings from Last 3 Encounters:  05/20/23 (!) 140/80  05/09/23 123/68  05/03/23 118/64    Wt Readings from Last 3 Encounters:  05/09/23 192 lb 7.4 oz (87.3 kg)  04/28/23 200 lb (90.7 kg)  04/16/23 202 lb (91.6 kg)    Physical Exam Constitutional:      General: He is not in acute distress.    Appearance: Normal appearance. He is well-developed.     Comments: NAD  Eyes:     Conjunctiva/sclera: Conjunctivae normal.     Pupils: Pupils are equal, round, and reactive to light.  Neck:     Thyroid: No thyromegaly.     Vascular: No JVD.  Cardiovascular:     Rate and Rhythm: Normal rate and regular rhythm.     Heart sounds: Normal heart sounds. No murmur heard.    No friction rub. No gallop.  Pulmonary:     Effort: Pulmonary effort is  normal. No respiratory distress.     Breath sounds: Normal breath sounds. No wheezing or rales.  Chest:     Chest wall: No tenderness.  Abdominal:     General: Bowel sounds are normal. There is no distension.     Palpations: Abdomen is soft. There is no mass.     Tenderness: There is no abdominal tenderness. There is no guarding or rebound.  Musculoskeletal:        General: Tenderness present. Normal range of motion.     Cervical back: Normal range of motion.     Right lower leg: Edema present.      Left lower leg: Edema present.  Lymphadenopathy:     Cervical: No cervical adenopathy.  Skin:    General: Skin is warm and dry.     Findings: No rash.  Neurological:     Mental Status: He is alert and oriented to person, place, and time.     Cranial Nerves: No cranial nerve deficit.     Motor: No abnormal muscle tone.     Coordination: Coordination normal.     Gait: Gait abnormal.     Deep Tendon Reflexes: Reflexes are normal and symmetric.  Psychiatric:        Behavior: Behavior normal.        Thought Content: Thought content normal.        Judgment: Judgment normal.   B knee scars B leg braces  Trace to 1+ edema B   Lab Results  Component Value Date   WBC 10.2 05/08/2023   HGB 14.8 05/08/2023   HCT 43.3 05/08/2023   PLT 379 05/08/2023   GLUCOSE 99 05/08/2023   CHOL 190 05/31/2022   TRIG 144.0 05/31/2022   HDL 57.80 05/31/2022   LDLDIRECT 96.0 05/30/2020   LDLCALC 103 (H) 05/31/2022   ALT 30 05/04/2023   AST 68 (H) 05/04/2023   NA 133 (L) 05/08/2023   K 4.3 05/08/2023   CL 101 05/08/2023   CREATININE 0.94 05/08/2023   BUN 19 05/08/2023   CO2 24 05/08/2023   TSH 5.875 (H) 05/03/2023   PSA 0.16 05/31/2022    VAS [place] LOWER EXTREMITY VENOUS (DVT) Result Date: 05/04/2023  Lower Venous DVT Study Patient Name:  Paul Spivack.  Date of Exam:   05/04/2023 Medical Rec #: [id number]             Accession #:    [id number] Date of Birth: [birth year]             Patient Gender: M Patient Age:   24 years Exam Location:  [facility] Procedure:      VAS [place] LOWER EXTREMITY VENOUS (DVT) Referring Phys: Mariam Dollar --------------------------------------------------------------------------------  Indications: Swelling.  Risk Factors: None identified. Limitations: Poor ultrasound/tissue interface. Comparison Study: No prior studies. Performing Technologist: Chanda Busing RVT  Examination Guidelines: A complete evaluation includes B-mode imaging, spectral Doppler, color  Doppler, and power Doppler as needed of all accessible portions of each vessel. Bilateral testing is considered an integral part of a complete examination. Limited examinations for reoccurring indications may be performed as noted. The reflux portion of the exam is performed with the patient in reverse Trendelenburg.  +---------+---------------+---------+-----------+----------+--------------+ RIGHT    CompressibilityPhasicitySpontaneityPropertiesThrombus Aging +---------+---------------+---------+-----------+----------+--------------+ CFV      Full           Yes      Yes                                 +---------+---------------+---------+-----------+----------+--------------+  SFJ      Full                                                        +---------+---------------+---------+-----------+----------+--------------+ FV Prox  Full                                                        +---------+---------------+---------+-----------+----------+--------------+ FV Mid                  Yes      Yes                                 +---------+---------------+---------+-----------+----------+--------------+ FV Distal               Yes      Yes                                 +---------+---------------+---------+-----------+----------+--------------+ PFV      Full                                                        +---------+---------------+---------+-----------+----------+--------------+ POP      Full           Yes      Yes                                 +---------+---------------+---------+-----------+----------+--------------+ PTV      Full                                                        +---------+---------------+---------+-----------+----------+--------------+ PERO     Full                                                        +---------+---------------+---------+-----------+----------+--------------+    +---------+---------------+---------+-----------+----------+--------------+ LEFT     CompressibilityPhasicitySpontaneityPropertiesThrombus Aging +---------+---------------+---------+-----------+----------+--------------+ CFV      Full           Yes      Yes                                 +---------+---------------+---------+-----------+----------+--------------+ SFJ      Full                                                        +---------+---------------+---------+-----------+----------+--------------+  FV Prox  Full                                                        +---------+---------------+---------+-----------+----------+--------------+ FV Mid   Full                                                        +---------+---------------+---------+-----------+----------+--------------+ FV Distal               Yes      Yes                                 +---------+---------------+---------+-----------+----------+--------------+ PFV      Full                                                        +---------+---------------+---------+-----------+----------+--------------+ POP      Full           Yes      Yes                                 +---------+---------------+---------+-----------+----------+--------------+ PTV      Full                                                        +---------+---------------+---------+-----------+----------+--------------+ PERO     Full                                                        +---------+---------------+---------+-----------+----------+--------------+     Summary: RIGHT: - There is no evidence of deep vein thrombosis in the lower extremity. However, portions of this examination were limited- see technologist comments above.  - No cystic structure found in the popliteal fossa.  LEFT: - There is no evidence of deep vein thrombosis in the lower extremity. However, portions of this examination were  limited- see technologist comments above.  - No cystic structure found in the popliteal fossa.  *See table(s) above for measurements and observations. Electronically signed by Sherald Hess MD on 05/04/2023 at 4:52:20 PM.    Final    ECHOCARDIOGRAM COMPLETE Result Date: 05/03/2023    ECHOCARDIOGRAM REPORT   Patient Name:   Paul Petrow. Date of Exam: 05/03/2023 Medical Rec #:  [id number]            Height:       72.0 in Accession #:    [id number]           Weight:       200.0 lb Date  of Birth:  03-Mar-1946            BSA:          2.131 m Patient Age:    77 years             BP:           139/71 mmHg Patient Gender: M                    HR:           73 bpm. Exam Location:  Inpatient Procedure: 2D Echo, Color Doppler and Cardiac Doppler (Both Spectral and Color            Flow Doppler were utilized during procedure). Indications:    Atrial Fibrillation I48.91  History:        Patient has prior history of Echocardiogram examinations, most                 recent 03/09/2019. COPD; Risk Factors:Hypertension and                 Dyslipidemia.  Sonographer:    Delton Coombes Referring Phys: [id number] RAMESH KC IMPRESSIONS  1. Left ventricular ejection fraction, by estimation, is 60 to 65%. The left ventricle has normal function. The left ventricle has no regional wall motion abnormalities. There is mild concentric left ventricular hypertrophy. Left ventricular diastolic parameters were normal.  2. Right ventricular systolic function is normal. The right ventricular size is mildly enlarged. There is normal pulmonary artery systolic pressure. The estimated right ventricular systolic pressure is 23.1 mmHg.  3. The mitral valve is grossly normal. Trivial mitral valve regurgitation. No evidence of mitral stenosis.  4. The aortic valve is tricuspid. Aortic valve regurgitation is not visualized. No aortic stenosis is present.  5. The inferior vena cava is dilated in size with >50% respiratory variability, suggesting right  atrial pressure of 8 mmHg. FINDINGS  Left Ventricle: Left ventricular ejection fraction, by estimation, is 60 to 65%. The left ventricle has normal function. The left ventricle has no regional wall motion abnormalities. Strain imaging was not performed. The left ventricular internal cavity  size was normal in size. There is mild concentric left ventricular hypertrophy. Left ventricular diastolic parameters were normal. Right Ventricle: The right ventricular size is mildly enlarged. No increase in right ventricular wall thickness. Right ventricular systolic function is normal. There is normal pulmonary artery systolic pressure. The tricuspid regurgitant velocity is 1.94  m/s, and with an assumed right atrial pressure of 8 mmHg, the estimated right ventricular systolic pressure is 23.1 mmHg. Left Atrium: Left atrial size was normal in size. Right Atrium: Right atrial size was normal in size. Pericardium: There is no evidence of pericardial effusion. Mitral Valve: The mitral valve is grossly normal. Trivial mitral valve regurgitation. No evidence of mitral valve stenosis. Tricuspid Valve: The tricuspid valve is grossly normal. Tricuspid valve regurgitation is trivial. No evidence of tricuspid stenosis. Aortic Valve: The aortic valve is tricuspid. Aortic valve regurgitation is not visualized. No aortic stenosis is present. Pulmonic Valve: The pulmonic valve was grossly normal. Pulmonic valve regurgitation is not visualized. No evidence of pulmonic stenosis. Aorta: The aortic root and ascending aorta are structurally normal, with no evidence of dilitation. Venous: The inferior vena cava is dilated in size with greater than 50% respiratory variability, suggesting right atrial pressure of 8 mmHg. IAS/Shunts: The atrial septum is grossly normal. Additional Comments: 3D imaging was not performed.  LEFT VENTRICLE PLAX 2D  LVIDd:         4.40 cm   Diastology LVIDs:         2.80 cm   LV e' medial:    9.25 cm/s LV PW:          1.10 cm   LV E/e' medial:  9.2 LV IVS:        1.30 cm   LV e' lateral:   8.05 cm/s LVOT diam:     2.20 cm   LV E/e' lateral: 10.5 LV SV:         65 LV SV Index:   31 LVOT Area:     3.80 cm  RIGHT VENTRICLE             IVC RV Basal diam:  4.30 cm     IVC diam: 2.50 cm RV Mid diam:    3.40 cm RV S prime:     15.20 cm/s TAPSE (M-mode): 2.7 cm LEFT ATRIUM             Index        RIGHT ATRIUM           Index LA diam:        3.40 cm 1.60 cm/m   RA Area:     18.20 cm LA Vol (A2C):   23.1 ml 10.84 ml/m  RA Volume:   45.60 ml  21.40 ml/m LA Vol (A4C):   41.2 ml 19.34 ml/m LA Biplane Vol: 30.8 ml 14.46 ml/m  AORTIC VALVE LVOT Vmax:   87.50 cm/s LVOT Vmean:  59.400 cm/s LVOT VTI:    0.171 m  AORTA Ao Root diam: 3.30 cm Ao Asc diam:  3.70 cm MITRAL VALVE               TRICUSPID VALVE MV Area (PHT): 4.49 cm    TR Peak grad:   15.1 mmHg MV Decel Time: 169 msec    TR Vmax:        194.00 cm/s MV E velocity: 84.80 cm/s MV A velocity: 54.20 cm/s  SHUNTS MV E/A ratio:  1.56        Systemic VTI:  0.17 m                            Systemic Diam: 2.20 cm Lennie Odor MD Electronically signed by Lennie Odor MD Signature Date/Time: 05/03/2023/11:15:07 AM    Final     Assessment & Plan:   Problem List Items Addressed This Visit     Chronic low back pain   Norco for leg pain      Relevant Medications   predniSONE (DELTASONE) 10 MG tablet   HYDROcodone-acetaminophen (NORCO) 10-325 MG tablet   New onset atrial fibrillation (HCC)   On Eliquis, metoprolol      Relevant Medications   atorvastatin (LIPITOR) 10 MG tablet   triamterene-hydrochlorothiazide (MAXZIDE-25) 37.5-25 MG tablet   furosemide (LASIX) 20 MG tablet   Quadriceps tendon rupture - Primary   04/28/2023 B -  bilateral quadricep tendon rupture large subcutaneous hematoma.  Status post quad tendon rupture repair 04/30/2023 per Dr. Eulah Pont.  Bilateral knee immobilizers at all times.  Nonweightbearing except okay to weight-bear as tolerated for pivot  transfers.  Norco prn  Potential benefits of a long term opioids use as well as potential risks (i.e. addiction risk, apnea etc) and complications (i.e. Somnolence, constipation and others) were explained to the patient and were aknowledged. RTC 1  mo      Edema   B LEs Use Lasix prn         Meds ordered this encounter  Medications   furosemide (LASIX) 20 MG tablet    Sig: Take 1-2 tablets (20-40 mg total) by mouth daily as needed.    Dispense:  60 tablet    Refill:  1   HYDROcodone-acetaminophen (NORCO) 10-325 MG tablet    Sig: Take 1 tablet by mouth every 8 (eight) hours as needed.    Dispense:  90 tablet    Refill:  0      Follow-up: Return in about 4 weeks (around 06/17/2023) for Wellness Exam.  Sonda Primes, MD

## 2023-05-20 NOTE — Assessment & Plan Note (Signed)
On Eliquis, metoprolol

## 2023-05-20 NOTE — Assessment & Plan Note (Signed)
 B LEs Use Lasix prn

## 2023-05-20 NOTE — Assessment & Plan Note (Addendum)
 04/28/2023 B -  bilateral quadricep tendon rupture large subcutaneous hematoma.  Status post quad tendon rupture repair 04/30/2023 per Dr. Eulah Pont.  Bilateral knee immobilizers at all times.  Nonweightbearing except okay to weight-bear as tolerated for pivot transfers.  Norco prn  Potential benefits of a long term opioids use as well as potential risks (i.e. addiction risk, apnea etc) and complications (i.e. Somnolence, constipation and others) were explained to the patient and were aknowledged. RTC 1 mo

## 2023-05-20 NOTE — Assessment & Plan Note (Signed)
 Norco for leg pain
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
## 2023-05-28 ENCOUNTER — Encounter: Payer: Self-pay | Admitting: Physician Assistant

## 2023-05-28 ENCOUNTER — Ambulatory Visit: Payer: BC Managed Care – PPO | Attending: Physician Assistant | Admitting: Physician Assistant

## 2023-05-28 ENCOUNTER — Ambulatory Visit (INDEPENDENT_AMBULATORY_CARE_PROVIDER_SITE_OTHER)

## 2023-05-28 VITALS — BP 130/78 | HR 72 | Ht 72.0 in | Wt 185.2 lb

## 2023-05-28 DIAGNOSIS — I4891 Unspecified atrial fibrillation: Secondary | ICD-10-CM | POA: Diagnosis not present

## 2023-05-28 DIAGNOSIS — R748 Abnormal levels of other serum enzymes: Secondary | ICD-10-CM

## 2023-05-28 DIAGNOSIS — E785 Hyperlipidemia, unspecified: Secondary | ICD-10-CM | POA: Diagnosis not present

## 2023-05-28 DIAGNOSIS — I779 Disorder of arteries and arterioles, unspecified: Secondary | ICD-10-CM

## 2023-05-28 DIAGNOSIS — R931 Abnormal findings on diagnostic imaging of heart and coronary circulation: Secondary | ICD-10-CM

## 2023-05-28 DIAGNOSIS — I9789 Other postprocedural complications and disorders of the circulatory system, not elsewhere classified: Secondary | ICD-10-CM

## 2023-05-28 DIAGNOSIS — R600 Localized edema: Secondary | ICD-10-CM

## 2023-05-28 DIAGNOSIS — I10 Essential (primary) hypertension: Secondary | ICD-10-CM | POA: Diagnosis not present

## 2023-05-28 NOTE — Patient Instructions (Signed)
 Medication Instructions:  Your physician recommends that you continue on your current medications as directed. Please refer to the Current Medication list given to you today.  *If you need a refill on your cardiac medications before your next appointment, please call your pharmacy*   Lab Work: TODAY:  CMET, MAG, BNP, CBC, TSH, FT4, & CK  If you have labs (blood work) drawn today and your tests are completely normal, you will receive your results only by: MyChart Message (if you have MyChart) OR A paper copy in the mail If you have any lab test that is abnormal or we need to change your treatment, we will call you to review the results.   Testing/Procedures: Paul [name]- Long Term Monitor Instructions  Your physician has requested you wear a ZIO patch monitor for 14 days.  This is a single patch monitor. [employer] supplies one patch monitor per enrollment. Additional stickers are not available. Please do not apply patch if you will be having a Nuclear Stress Test,  Echocardiogram, Cardiac CT, MRI, or Chest Xray during the period you would be wearing the  monitor. The patch cannot be worn during these tests. You cannot remove and re-apply the  ZIO XT patch monitor.  Your ZIO patch monitor will be mailed 3 day USPS to your address on file. It may take 3-5 days  to receive your monitor after you have been enrolled.  Once you have received your monitor, please review the enclosed instructions. Your monitor  has already been registered assigning a specific monitor serial # to you.  Billing and Patient Assistance Program Information  We have supplied [employer] with any of your insurance information on file for billing purposes. [employer] offers a sliding scale Patient Assistance Program for patients that do not have  insurance, or whose insurance does not completely cover the cost of the ZIO monitor.  You must apply for the Patient Assistance Program to qualify for this discounted rate.  To apply,  please call [employer] at [phone], select option 4, select option 2, ask to apply for  Patient Assistance Program. Meredeth Ide will ask your household income, and how many people  are in your household. They will quote your out-of-pocket cost based on that information.  [employer] will also be able to set up a 47-month, interest-free payment plan if needed.  Applying the monitor   Shave hair from upper left chest.  Hold abrader disc by orange tab. Rub abrader in 40 strokes over the upper left chest as  indicated in your monitor instructions.  Clean area with 4 enclosed alcohol pads. Let dry.  Apply patch as indicated in monitor instructions. Patch will be placed under collarbone on left  side of chest with arrow pointing upward.  Rub patch adhesive wings for 2 minutes. Remove white label marked "1". Remove the white  label marked "2". Rub patch adhesive wings for 2 additional minutes.  While looking in a mirror, press and release button in center of patch. A small green light will  flash 3-4 times. This will be your only indicator that the monitor has been turned on.  Do not shower for the first 24 hours. You may shower after the first 24 hours.  Press the button if you feel a symptom. You will hear a small click. Record Date, Time and  Symptom in the Patient Logbook.  When you are ready to remove the patch, follow instructions on the last 2 pages of Patient  Logbook. Stick patch monitor onto the  last page of Patient Logbook.  Place Patient Logbook in the blue and white box. Use locking tab on box and tape box closed  securely. The blue and white box has prepaid postage on it. Please place it in the mailbox as  soon as possible. Your physician should have your test results approximately 7 days after the  monitor has been mailed back to [facility].  Call [facility] Customer Care at [phone] if you have questions regarding  your ZIO XT patch monitor. Call them immediately if you see  an orange light blinking on your  monitor.  If your monitor falls off in less than 4 days, contact our Monitor department at [phone].  If your monitor becomes loose or falls off after 4 days call [employer] at [phone] for  suggestions on securing your monitor    Follow-Up: At [facility], you and your health needs are our priority.  As part of our continuing mission to provide you with exceptional heart care, we have created designated Provider Care Teams.  These Care Teams include your primary Cardiologist (physician) and Advanced Practice Providers (APPs -  Physician Assistants and Nurse Practitioners) who all work together to provide you with the care you need, when you need it.  We recommend signing up for the patient portal called "MyChart".  Sign up information is provided on this After Visit Summary.  MyChart is used to connect with patients for Virtual Visits (Telemedicine).  Patients are able to view lab/test results, encounter notes, upcoming appointments, etc.  Non-urgent messages can be sent to your provider as well.   To learn more about what you can do with MyChart, go to [email or web address].    Your next appointment:   2 month(s)  Provider:   [name], DO  or Ronie Spies, PA-C         Other Instructions      1st Floor: - Lobby - Registration  - Pharmacy  - Lab - Cafe  2nd Floor: - PV Lab - Diagnostic Testing (echo, CT, nuclear med)  3rd Floor: - Vacant  4th Floor: - TCTS (cardiothoracic surgery) - AFib Clinic - Structural Heart Clinic - Vascular Surgery  - Vascular Ultrasound  5th Floor: - HeartCare Cardiology (general and EP) - Clinical Pharmacy for coumadin, hypertension, lipid, weight-loss medications, and med management appointments    Valet parking services will be available as well.

## 2023-05-28 NOTE — Progress Notes (Unsigned)
 Enrolled for [employer] to mail a ZIO XT long term holter monitor to the patients address on file.   Dr. Odis Hollingshead to read.

## 2023-05-29 LAB — COMPREHENSIVE METABOLIC PANEL
ALT: 31 IU/L (ref 0–44)
AST: 39 IU/L (ref 0–40)
Albumin: 4.4 g/dL (ref 3.8–4.8)
Alkaline Phosphatase: 83 IU/L (ref 44–121)
BUN/Creatinine Ratio: 27 — ABNORMAL HIGH (ref 10–24)
BUN: 26 mg/dL (ref 8–27)
Bilirubin Total: 0.5 mg/dL (ref 0.0–1.2)
CO2: 24 mmol/L (ref 20–29)
Calcium: 9.4 mg/dL (ref 8.6–10.2)
Chloride: 98 mmol/L (ref 96–106)
Creatinine, Ser: 0.96 mg/dL (ref 0.76–1.27)
Globulin, Total: 2 g/dL (ref 1.5–4.5)
Glucose: 92 mg/dL (ref 70–99)
Potassium: 4.7 mmol/L (ref 3.5–5.2)
Sodium: 136 mmol/L (ref 134–144)
Total Protein: 6.4 g/dL (ref 6.0–8.5)
eGFR: 81 mL/min/{1.73_m2} (ref >59)

## 2023-05-29 LAB — CBC
Hematocrit: 48.9 % (ref 37.5–51.0)
Hemoglobin: 16.7 g/dL (ref 13.0–17.7)
MCH: 31.6 pg (ref 26.6–33.0)
MCHC: 34.2 g/dL (ref 31.5–35.7)
MCV: 92 fL (ref 79–97)
Platelets: 323 10*3/uL (ref 150–450)
RBC: 5.29 x10E6/uL (ref 4.14–5.80)
RDW: 12 % (ref 11.6–15.4)
WBC: 10 10*3/uL (ref 3.4–10.8)

## 2023-05-29 LAB — TSH: TSH: 3.68 u[IU]/mL (ref 0.450–4.500)

## 2023-05-29 LAB — MAGNESIUM: Magnesium: 2.2 mg/dL (ref 1.6–2.3)

## 2023-05-29 LAB — CK: Total CK: 108 U/L (ref 41–331)

## 2023-05-29 LAB — T4, FREE: Free T4: 1.81 ng/dL — ABNORMAL HIGH (ref 0.82–1.77)

## 2023-05-29 LAB — PRO B NATRIURETIC PEPTIDE: NT-Pro BNP: 90 pg/mL (ref 0–486)

## 2023-06-04 DIAGNOSIS — S76119A Strain of unspecified quadriceps muscle, fascia and tendon, initial encounter: Secondary | ICD-10-CM | POA: Diagnosis not present

## 2023-06-04 DIAGNOSIS — J449 Chronic obstructive pulmonary disease, unspecified: Secondary | ICD-10-CM | POA: Diagnosis not present

## 2023-06-04 DIAGNOSIS — M5412 Radiculopathy, cervical region: Secondary | ICD-10-CM | POA: Diagnosis not present

## 2023-06-04 DIAGNOSIS — F988 Other specified behavioral and emotional disorders with onset usually occurring in childhood and adolescence: Secondary | ICD-10-CM | POA: Diagnosis not present

## 2023-06-07 ENCOUNTER — Other Ambulatory Visit: Payer: Self-pay | Admitting: Internal Medicine

## 2023-06-11 ENCOUNTER — Other Ambulatory Visit: Payer: Self-pay | Admitting: Internal Medicine

## 2023-06-12 DIAGNOSIS — M7041 Prepatellar bursitis, right knee: Secondary | ICD-10-CM | POA: Diagnosis not present

## 2023-06-13 ENCOUNTER — Other Ambulatory Visit: Payer: Self-pay | Admitting: Internal Medicine

## 2023-06-13 DIAGNOSIS — L570 Actinic keratosis: Secondary | ICD-10-CM | POA: Diagnosis not present

## 2023-06-13 DIAGNOSIS — D225 Melanocytic nevi of trunk: Secondary | ICD-10-CM | POA: Diagnosis not present

## 2023-06-13 DIAGNOSIS — C4442 Squamous cell carcinoma of skin of scalp and neck: Secondary | ICD-10-CM | POA: Diagnosis not present

## 2023-06-13 DIAGNOSIS — L821 Other seborrheic keratosis: Secondary | ICD-10-CM | POA: Diagnosis not present

## 2023-06-13 DIAGNOSIS — D485 Neoplasm of uncertain behavior of skin: Secondary | ICD-10-CM | POA: Diagnosis not present

## 2023-06-13 DIAGNOSIS — Z85828 Personal history of other malignant neoplasm of skin: Secondary | ICD-10-CM | POA: Diagnosis not present

## 2023-06-13 DIAGNOSIS — L578 Other skin changes due to chronic exposure to nonionizing radiation: Secondary | ICD-10-CM | POA: Diagnosis not present

## 2023-06-13 NOTE — Telephone Encounter (Unsigned)
 Copied from CRM [id number]. Topic: Clinical - Prescription Issue >> Jun 13, 2023  3:27 PM Denese Killings wrote: Reason for CRM: Selena Batten with [facility]) is requesting a new prescription to be sent for buPROPion (WELLBUTRIN XL) 150 MG 24 hr tablet and pantoprazole (PROTONIX) 40 MG tablet.

## 2023-06-14 ENCOUNTER — Other Ambulatory Visit: Payer: Self-pay | Admitting: Internal Medicine

## 2023-06-14 MED ORDER — BUPROPION HCL ER (XL) 150 MG PO TB24: 150.0000 mg | ORAL_TABLET | Freq: Every day | ORAL | 0 refills | Status: DC

## 2023-06-14 MED ORDER — PANTOPRAZOLE SODIUM 40 MG PO TBEC: 40.0000 mg | DELAYED_RELEASE_TABLET | Freq: Every day | ORAL | 0 refills | Status: DC

## 2023-06-16 ENCOUNTER — Other Ambulatory Visit: Payer: Self-pay | Admitting: Internal Medicine

## 2023-06-18 DIAGNOSIS — S76121S Laceration of right quadriceps muscle, fascia and tendon, sequela: Secondary | ICD-10-CM | POA: Diagnosis not present

## 2023-06-18 DIAGNOSIS — S76122S Laceration of left quadriceps muscle, fascia and tendon, sequela: Secondary | ICD-10-CM | POA: Diagnosis not present

## 2023-06-18 DIAGNOSIS — M25562 Pain in left knee: Secondary | ICD-10-CM | POA: Diagnosis not present

## 2023-06-18 DIAGNOSIS — M25561 Pain in right knee: Secondary | ICD-10-CM | POA: Diagnosis not present

## 2023-06-19 DIAGNOSIS — S76122S Laceration of left quadriceps muscle, fascia and tendon, sequela: Secondary | ICD-10-CM | POA: Diagnosis not present

## 2023-06-19 DIAGNOSIS — M25562 Pain in left knee: Secondary | ICD-10-CM | POA: Diagnosis not present

## 2023-06-19 DIAGNOSIS — S76121S Laceration of right quadriceps muscle, fascia and tendon, sequela: Secondary | ICD-10-CM | POA: Diagnosis not present

## 2023-06-19 DIAGNOSIS — M25561 Pain in right knee: Secondary | ICD-10-CM | POA: Diagnosis not present

## 2023-06-21 DIAGNOSIS — I4891 Unspecified atrial fibrillation: Secondary | ICD-10-CM | POA: Diagnosis not present

## 2023-06-21 DIAGNOSIS — I9789 Other postprocedural complications and disorders of the circulatory system, not elsewhere classified: Secondary | ICD-10-CM | POA: Diagnosis not present

## 2023-06-24 DIAGNOSIS — M25561 Pain in right knee: Secondary | ICD-10-CM | POA: Diagnosis not present

## 2023-06-24 DIAGNOSIS — S76121S Laceration of right quadriceps muscle, fascia and tendon, sequela: Secondary | ICD-10-CM | POA: Diagnosis not present

## 2023-06-24 DIAGNOSIS — S76122S Laceration of left quadriceps muscle, fascia and tendon, sequela: Secondary | ICD-10-CM | POA: Diagnosis not present

## 2023-06-24 DIAGNOSIS — M25562 Pain in left knee: Secondary | ICD-10-CM | POA: Diagnosis not present

## 2023-06-27 ENCOUNTER — Emergency Department (HOSPITAL_COMMUNITY)

## 2023-06-27 ENCOUNTER — Other Ambulatory Visit: Payer: Self-pay

## 2023-06-27 ENCOUNTER — Emergency Department (HOSPITAL_COMMUNITY)
Admission: EM | Admit: 2023-06-27 | Discharge: 2023-06-27 | Disposition: A | Discharge status: Home / Self Care | Attending: Emergency Medicine | Admitting: Emergency Medicine

## 2023-06-27 ENCOUNTER — Encounter (HOSPITAL_COMMUNITY): Payer: Self-pay

## 2023-06-27 DIAGNOSIS — I2510 Atherosclerotic heart disease of native coronary artery without angina pectoris: Secondary | ICD-10-CM | POA: Diagnosis not present

## 2023-06-27 DIAGNOSIS — J449 Chronic obstructive pulmonary disease, unspecified: Secondary | ICD-10-CM | POA: Insufficient documentation

## 2023-06-27 DIAGNOSIS — I10 Essential (primary) hypertension: Secondary | ICD-10-CM | POA: Diagnosis not present

## 2023-06-27 DIAGNOSIS — W19XXXA Unspecified fall, initial encounter: Secondary | ICD-10-CM

## 2023-06-27 DIAGNOSIS — S8982XA Other specified injuries of left lower leg, initial encounter: Secondary | ICD-10-CM | POA: Diagnosis not present

## 2023-06-27 DIAGNOSIS — W108XXA Fall (on) (from) other stairs and steps, initial encounter: Secondary | ICD-10-CM | POA: Diagnosis not present

## 2023-06-27 DIAGNOSIS — R000 Tachycardia, unspecified: Secondary | ICD-10-CM | POA: Diagnosis not present

## 2023-06-27 DIAGNOSIS — M25562 Pain in left knee: Secondary | ICD-10-CM | POA: Insufficient documentation

## 2023-06-27 DIAGNOSIS — M25462 Effusion, left knee: Secondary | ICD-10-CM | POA: Diagnosis not present

## 2023-06-27 DIAGNOSIS — S80919A Unspecified superficial injury of unspecified knee, initial encounter: Secondary | ICD-10-CM | POA: Diagnosis not present

## 2023-06-27 DIAGNOSIS — R110 Nausea: Secondary | ICD-10-CM | POA: Diagnosis not present

## 2023-06-27 MED ORDER — HYDROMORPHONE HCL 1 MG/ML IJ SOLN
0.5000 mg | INTRAMUSCULAR | Status: DC | PRN
  Administered 2023-06-27 (×2): 0.5 mg via INTRAVENOUS
  Filled 2023-06-27 (×2): qty 1

## 2023-06-27 NOTE — ED Provider Notes (Signed)
 [place] EMERGENCY DEPARTMENT AT [facility] Provider Note   CSN: [id number] Arrival date & time: 06/27/23  1647     History Chief Complaint  Patient presents with   Fall    HPI Paul [name]. is a 78 y.o. male presenting for chief complaint of fall States that he recently had quadriceps repair. Twisted his knee while walking down stairs and went onto left side.  On eliquis  HX of COPD/CAD/multiple orthopedic conditions  Left arm at the shoulder is sore Left knee not functioning Has walker and crutches at home  Patient's recorded medical, surgical, social, medication list and allergies were reviewed in the Snapshot window as part of the initial history.   Review of Systems   Review of Systems  Constitutional:  Negative for chills and fever.  HENT:  Negative for ear pain and sore throat.   Eyes:  Negative for pain and visual disturbance.  Respiratory:  Negative for cough and shortness of breath.   Cardiovascular:  Negative for chest pain and palpitations.  Gastrointestinal:  Negative for abdominal pain and vomiting.  Genitourinary:  Negative for dysuria and hematuria.  Musculoskeletal:  Negative for arthralgias and back pain.  Skin:  Negative for color change and rash.  Neurological:  Negative for seizures and syncope.  All other systems reviewed and are negative.   Physical Exam Updated Vital Signs BP (!) 163/96   Pulse 72   Temp 98 F (36.7 C) (Oral)   Resp 18   Ht 6' (1.829 m)   Wt 84 kg   SpO2 99%   BMI 25.12 kg/m  Physical Exam Vitals and nursing note reviewed.  Constitutional:      General: He is not in acute distress.    Appearance: He is well-developed.  HENT:     Head: Normocephalic and atraumatic.  Eyes:     Conjunctiva/sclera: Conjunctivae normal.  Cardiovascular:     Rate and Rhythm: Normal rate and regular rhythm.     Heart sounds: No murmur heard. Pulmonary:     Effort: Pulmonary effort is normal. No respiratory  distress.     Breath sounds: Normal breath sounds.  Abdominal:     Palpations: Abdomen is soft.     Tenderness: There is no abdominal tenderness.  Musculoskeletal:        General: Deformity and signs of injury present. No swelling.     Cervical back: Neck supple.  Skin:    General: Skin is warm and dry.     Capillary Refill: Capillary refill takes less than 2 seconds.  Neurological:     Mental Status: He is alert.  Psychiatric:        Mood and Affect: Mood normal.      ED Course/ Medical Decision Making/ A&P    Procedures Procedures   Medications Ordered in ED Medications  HYDROmorphone (DILAUDID) injection 0.5 mg (0.5 mg Intravenous Given 06/27/23 2100)    Medical Decision Making:   Recurrent fall and knee pain.  X-ray showed likely recurrent quadricep tendon sprain.  Consulted his orthopedic group who recommended pain control and plan to follow-up in their clinic in the outpatient setting.  Said his knee immobilizer to 180 degrees per their recommendations.  Disposition:  I have considered need for hospitalization, however, considering all of the above, I believe this patient is stable for discharge at this time.  Patient/family educated about specific return precautions for given chief complaint and symptoms.  Patient/family educated about follow-up with PCP.  Patient/family expressed understanding of return precautions and need for follow-up. Patient spoken to regarding all imaging and laboratory results and appropriate follow up for these results. All education provided in verbal form with additional information in written form. Time was allowed for answering of patient questions. Patient discharged.    Emergency Department Medication Summary:   Medications  HYDROmorphone (DILAUDID) injection 0.5 mg (0.5 mg Intravenous Given 06/27/23 2100)          Clinical Impression:  1. Fall, initial encounter      Discharge   Final Clinical Impression(s) / ED  Diagnoses Final diagnoses:  Fall, initial encounter    Rx / DC Orders ED Discharge Orders          Ordered    Nursing communication       Comments: Place back in knee immobilizer at 180 degree extension (fully straight)   06/27/23 2042              Onetha Bile, MD 06/27/23 2337

## 2023-06-27 NOTE — ED Triage Notes (Addendum)
 Per EMS, pt fell onto left knee today. Had surgery to repair 2 tendons in same knee. Unable to put any weight on left leg. Pt was given 200mcg of fentanyl, 4mg  of ondansetron, 500ml of LR. 18g in left forearm by EMS.

## 2023-06-28 DIAGNOSIS — M25562 Pain in left knee: Secondary | ICD-10-CM | POA: Diagnosis not present

## 2023-07-01 DIAGNOSIS — M25562 Pain in left knee: Secondary | ICD-10-CM | POA: Diagnosis not present

## 2023-07-04 DIAGNOSIS — S838X1A Sprain of other specified parts of right knee, initial encounter: Secondary | ICD-10-CM | POA: Diagnosis not present

## 2023-07-04 DIAGNOSIS — G8918 Other acute postprocedural pain: Secondary | ICD-10-CM | POA: Diagnosis not present

## 2023-07-04 DIAGNOSIS — S76112A Strain of left quadriceps muscle, fascia and tendon, initial encounter: Secondary | ICD-10-CM | POA: Diagnosis not present

## 2023-07-04 DIAGNOSIS — S76102A Unspecified injury of left quadriceps muscle, fascia and tendon, initial encounter: Secondary | ICD-10-CM | POA: Diagnosis not present

## 2023-07-04 DIAGNOSIS — X58XXXA Exposure to other specified factors, initial encounter: Secondary | ICD-10-CM | POA: Diagnosis not present

## 2023-07-04 DIAGNOSIS — Y999 Unspecified external cause status: Secondary | ICD-10-CM | POA: Diagnosis not present

## 2023-07-04 DIAGNOSIS — S83422A Sprain of lateral collateral ligament of left knee, initial encounter: Secondary | ICD-10-CM | POA: Diagnosis not present

## 2023-07-04 DIAGNOSIS — S76111A Strain of right quadriceps muscle, fascia and tendon, initial encounter: Secondary | ICD-10-CM | POA: Diagnosis not present

## 2023-07-04 DIAGNOSIS — S83412A Sprain of medial collateral ligament of left knee, initial encounter: Secondary | ICD-10-CM | POA: Diagnosis not present

## 2023-07-07 DIAGNOSIS — I4891 Unspecified atrial fibrillation: Secondary | ICD-10-CM | POA: Diagnosis not present

## 2023-07-07 DIAGNOSIS — I9789 Other postprocedural complications and disorders of the circulatory system, not elsewhere classified: Secondary | ICD-10-CM | POA: Diagnosis not present

## 2023-07-12 NOTE — Progress Notes (Signed)
 Sure.  [name], DO, [facility]

## 2023-07-14 ENCOUNTER — Other Ambulatory Visit: Payer: Self-pay | Admitting: Internal Medicine

## 2023-07-15 DIAGNOSIS — S76102D Unspecified injury of left quadriceps muscle, fascia and tendon, subsequent encounter: Secondary | ICD-10-CM | POA: Diagnosis not present

## 2023-07-16 ENCOUNTER — Encounter: Payer: Self-pay | Admitting: Internal Medicine

## 2023-07-16 ENCOUNTER — Ambulatory Visit (INDEPENDENT_AMBULATORY_CARE_PROVIDER_SITE_OTHER): Payer: BC Managed Care – PPO | Admitting: Internal Medicine

## 2023-07-16 ENCOUNTER — Ambulatory Visit: Admitting: Internal Medicine

## 2023-07-16 VITALS — BP 118/62 | HR 74 | Temp 98.1°F | Ht 72.0 in | Wt 192.7 lb

## 2023-07-16 DIAGNOSIS — M25562 Pain in left knee: Secondary | ICD-10-CM

## 2023-07-16 DIAGNOSIS — G4700 Insomnia, unspecified: Secondary | ICD-10-CM | POA: Diagnosis not present

## 2023-07-16 DIAGNOSIS — L989 Disorder of the skin and subcutaneous tissue, unspecified: Secondary | ICD-10-CM

## 2023-07-16 DIAGNOSIS — M5442 Lumbago with sciatica, left side: Secondary | ICD-10-CM

## 2023-07-16 DIAGNOSIS — S76119D Strain of unspecified quadriceps muscle, fascia and tendon, subsequent encounter: Secondary | ICD-10-CM | POA: Diagnosis not present

## 2023-07-16 DIAGNOSIS — S76121S Laceration of right quadriceps muscle, fascia and tendon, sequela: Secondary | ICD-10-CM | POA: Diagnosis not present

## 2023-07-16 DIAGNOSIS — S76122S Laceration of left quadriceps muscle, fascia and tendon, sequela: Secondary | ICD-10-CM | POA: Diagnosis not present

## 2023-07-16 DIAGNOSIS — M25561 Pain in right knee: Secondary | ICD-10-CM | POA: Diagnosis not present

## 2023-07-16 DIAGNOSIS — M5441 Lumbago with sciatica, right side: Secondary | ICD-10-CM

## 2023-07-16 DIAGNOSIS — F419 Anxiety disorder, unspecified: Secondary | ICD-10-CM | POA: Diagnosis not present

## 2023-07-16 DIAGNOSIS — G8929 Other chronic pain: Secondary | ICD-10-CM

## 2023-07-16 MED ORDER — TRIAMCINOLONE ACETONIDE 0.5 % EX OINT: 1.0000 | TOPICAL_OINTMENT | Freq: Four times a day (QID) | CUTANEOUS | 1 refills | Status: DC

## 2023-07-16 MED ORDER — HYDROCODONE-ACETAMINOPHEN 10-325 MG PO TABS: 1.0000 | ORAL_TABLET | Freq: Three times a day (TID) | ORAL | 0 refills | Status: DC | PRN

## 2023-07-16 NOTE — Assessment & Plan Note (Signed)
Chronic  Continue zolpidem prn  Potential benefits of a long term benzodiazepines  use as well as potential risks  and complications were explained to the patient and were aknowledged.

## 2023-07-16 NOTE — Assessment & Plan Note (Signed)
 Healing well, but...another injury in left leg t

## 2023-07-16 NOTE — Progress Notes (Signed)
 Subjective:  Patient ID: Paul Moritz., male    DOB: 11-Oct-1945  Age: 78 y.o. MRN: [id number]  CC: Medical Management of Chronic Issues (3 Month follow up. Patient notes another injury in left leg that patient would like to be checked out. Also notes of tender/discolored spot on abdomen (right side), slightly raised. Review most recent heart monitor results)   HPI Paul [name]. presents for another LLE injury: Patient notes another injury in left leg that patient would like to be checked out. Also notes of tender/discolored spot on abdomen (right side), slightly raised. Review most recent heart monitor results.  Outpatient Medications Prior to Visit  Medication Sig Dispense Refill   acetaminophen  (TYLENOL ) 325 MG tablet Take 1-2 tablets (325-650 mg total) by mouth every 6 (six) hours as needed for mild pain (pain score 1-3).     apixaban  (ELIQUIS ) 5 MG TABS tablet Take 1 tablet (5 mg total) by mouth 2 (two) times daily. 60 tablet 0   atorvastatin  (LIPITOR) 10 MG tablet Take 10 mg by mouth daily.     Baclofen  5 MG TABS Take 1 tablet (5 mg total) by mouth 3 (three) times daily as needed (hiccups). 10 tablet 0   budesonide -formoterol  (SYMBICORT ) 80-4.5 MCG/ACT inhaler Inhale 2 puffs into the lungs 2 (two) times daily. 1 each 12   buPROPion  (WELLBUTRIN  XL) 150 MG 24 hr tablet Take 1 tablet by mouth daily. 90 tablet 1   cholecalciferol (VITAMIN D3) 25 MCG (1000 UNIT) tablet Take 1,000 Units by mouth daily.     finasteride  (PROSCAR ) 5 MG tablet Take 1 tablet by mouth daily. 90 tablet 3   Fluticasone -Umeclidin-Vilant (TRELEGY ELLIPTA ) 100-62.5-25 MCG/ACT AEPB INHALE 1 PUFF INTO THE LUNGS DAILY 180 each 1   furosemide  (LASIX ) 20 MG tablet TAKE 1 TO 2 TABLETS BY MOUTH DAILY AS NEEDED 180 tablet 1   Homeopathic Products [facility] MENTAL FOCUS PO) Take 1 tablet by mouth daily.     magnesium  oxide (MAG-OX) 400 (240 Mg) MG tablet Take 1 tablet (400 mg total) by mouth daily. 30 tablet 0    methocarbamol  (ROBAXIN ) 500 MG tablet Take 1 tablet (500 mg total) by mouth every 6 (six) hours as needed for muscle spasms. 45 tablet 0   metoprolol  tartrate (LOPRESSOR ) 25 MG tablet Take 1 tablet (25 mg total) by mouth 2 (two) times daily. 60 tablet 0   Multiple Vitamin (MULTIVITAMIN WITH MINERALS) TABS tablet Take 1 tablet by mouth daily.     pantoprazole  (PROTONIX ) 40 MG tablet Take 1 tablet by mouth daily. Annual appointment due in October, must see provider for future refills. 90 tablet 3   polyethylene glycol (MIRALAX  / GLYCOLAX ) 17 g packet Take 17 g by mouth 2 (two) times daily.     senna-docusate (SENOKOT-S) 8.6-50 MG tablet Take 2 tablets by mouth at bedtime.     Spacer/Aero-Holding Ismael Maria Use with Symbicort  inhaler 1 each 1   triamterene -hydrochlorothiazide (MAXZIDE-25) 37.5-25 MG tablet Take 1 tablet by mouth daily. 90 tablet 3   zolpidem  (AMBIEN ) 10 MG tablet TAKE 1 TABLET BY MOUTH AT BEDTIME AS NEEDED 90 tablet 1   HYDROcodone -acetaminophen  (NORCO) 10-325 MG tablet Take 1 tablet by mouth every 8 (eight) hours as needed. 90 tablet 0   naloxone  (NARCAN ) nasal spray 4 mg/0.1 mL USE AS NEEDED IN CASE OF OVERDOSE 2 each 1   No facility-administered medications prior to visit.    ROS: Review of Systems  Constitutional:  Negative for appetite change,  fatigue and unexpected weight change.  HENT:  Negative for congestion, nosebleeds, sneezing, sore throat and trouble swallowing.   Eyes:  Negative for itching and visual disturbance.  Respiratory:  Negative for cough.   Cardiovascular:  Negative for chest pain, palpitations and leg swelling.  Gastrointestinal:  Negative for abdominal distention, blood in stool, diarrhea and nausea.  Genitourinary:  Negative for frequency and hematuria.  Musculoskeletal:  Positive for arthralgias and gait problem. Negative for back pain, joint swelling and neck pain.  Skin:  Negative for rash.  Neurological:  Negative for dizziness, tremors,  speech difficulty and weakness.  Psychiatric/Behavioral:  Negative for agitation, dysphoric mood and sleep disturbance. The patient is not nervous/anxious.     Objective:  BP 118/62   Pulse 74   Temp 98.1 F (36.7 C)   Ht 6' (1.829 m)   Wt 192 lb 11.2 oz (87.4 kg)   SpO2 98%   BMI 26.13 kg/m   BP Readings from Last 3 Encounters:  07/16/23 118/62  06/27/23 (!) 163/96  05/28/23 130/78    Wt Readings from Last 3 Encounters:  07/16/23 192 lb 11.2 oz (87.4 kg)  06/27/23 185 lb 3 oz (84 kg)  05/28/23 185 lb 3.2 oz (84 kg)    Physical Exam Constitutional:      General: He is not in acute distress.    Appearance: He is well-developed.     Comments: NAD  Eyes:     Conjunctiva/sclera: Conjunctivae normal.     Pupils: Pupils are equal, round, and reactive to light.  Neck:     Thyroid : No thyromegaly.     Vascular: No JVD.  Cardiovascular:     Rate and Rhythm: Normal rate and regular rhythm.     Heart sounds: Normal heart sounds. No murmur heard.    No friction rub. No gallop.  Pulmonary:     Effort: Pulmonary effort is normal. No respiratory distress.     Breath sounds: Normal breath sounds. No wheezing or rales.  Chest:     Chest wall: No tenderness.  Abdominal:     General: Bowel sounds are normal. There is no distension.     Palpations: Abdomen is soft. There is no mass.     Tenderness: There is no abdominal tenderness. There is no guarding or rebound.  Musculoskeletal:        General: No tenderness. Normal range of motion.     Cervical back: Normal range of motion.  Lymphadenopathy:     Cervical: No cervical adenopathy.  Skin:    General: Skin is warm and dry.     Findings: No rash.  Neurological:     Mental Status: He is alert and oriented to person, place, and time.     Cranial Nerves: No cranial nerve deficit.     Motor: No abnormal muscle tone.     Coordination: Coordination normal.     Gait: Gait normal.     Deep Tendon Reflexes: Reflexes are normal and  symmetric.  Psychiatric:        Behavior: Behavior normal.        Thought Content: Thought content normal.        Judgment: Judgment normal.   L knee w/a scar, brace Excoriation 6x3 mm R chest Atrophy of thigh muscles B     A total time of 45 minutes was spent preparing to see the patient, reviewing tests, x-rays, operative reports and other medical records.  Also, obtaining history and performing comprehensive physical exam.  Additionally, counseling the patient regarding the above listed issues.   Finally, documenting clinical information in the health records, coordination of care, educating the patient. It is a complex case.  Lab Results  Component Value Date   WBC 10.0 05/28/2023   HGB 16.7 05/28/2023   HCT 48.9 05/28/2023   PLT 323 05/28/2023   GLUCOSE 92 05/28/2023   CHOL 190 05/31/2022   TRIG 144.0 05/31/2022   HDL 57.80 05/31/2022   LDLDIRECT 96.0 05/30/2020   LDLCALC 103 (H) 05/31/2022   ALT 31 05/28/2023   AST 39 05/28/2023   NA 136 05/28/2023   K 4.7 05/28/2023   CL 98 05/28/2023   CREATININE 0.96 05/28/2023   BUN 26 05/28/2023   CO2 24 05/28/2023   TSH 3.680 05/28/2023   PSA 0.16 05/31/2022    DG Knee Complete 4 Views Left Result Date: 06/27/2023 CLINICAL DATA:  Of the EXAM: LEFT KNEE - COMPLETE 4+ VIEW COMPARISON:  Left knee x-ray 04/28/2023 FINDINGS: There is severe prepatellar and suprapatellar soft tissue swelling with joint effusion. There is no evidence for acute fracture or dislocation. A linear calcific density in the suprapatellar region appears more superior when compared to the prior study. This may be related to edema, but quadriceps tendon injury/rupture is not excluded. Joint spaces are well maintained. IMPRESSION: 1. Severe prepatellar and suprapatellar soft tissue swelling with joint effusion. 2. Anterior soft tissue calcification displaced superiorly worrisome for quadriceps tendon rupture. Electronically Signed   By: Tyron Gallon M.D.   On:  06/27/2023 20:01    Assessment & Plan:   Problem List Items Addressed This Visit     Anxiety disorder - Primary    Hold Lipitor and Fenofibrate  due to c/o memory issues according to his wife x months...      INSOMNIA, CHRONIC   Chronic  Continue zolpidem  prn  Potential benefits of a long term benzodiazepines  use as well as potential risks  and complications were explained to the patient and were aknowledged.      Chronic low back pain   Norco for leg pain      Relevant Medications   HYDROcodone -acetaminophen  (NORCO) 10-325 MG tablet   Knee pain, left   New injury 06/27/2023 L knee Exercise/ info given Voltaren  gel Sports med ref offered       Quadriceps tendon rupture   Healing well, but...another injury in left leg t       Skin lesion      Meds ordered this encounter  Medications   triamcinolone  ointment (KENALOG ) 0.5 %    Sig: Apply 1 Application topically 4 (four) times daily.    Dispense:  60 g    Refill:  1   HYDROcodone -acetaminophen  (NORCO) 10-325 MG tablet    Sig: Take 1 tablet by mouth every 8 (eight) hours as needed.    Dispense:  90 tablet    Refill:  0      Follow-up: Return in about 2 months (around 09/15/2023) for a follow-up visit.  Anitra Barn, MD

## 2023-07-16 NOTE — Assessment & Plan Note (Signed)
 Hold Lipitor and Fenofibrate due to c/o memory issues according to his wife x months.Marland KitchenMarland Kitchen

## 2023-07-16 NOTE — Patient Instructions (Signed)
 TENS Massager gun Arnica cream

## 2023-07-16 NOTE — Assessment & Plan Note (Signed)
 New injury 06/27/2023 L knee Exercise/ info given Voltaren  gel Sports med ref offered

## 2023-07-16 NOTE — Assessment & Plan Note (Signed)
 Norco for leg pain

## 2023-07-18 DIAGNOSIS — S76121S Laceration of right quadriceps muscle, fascia and tendon, sequela: Secondary | ICD-10-CM | POA: Diagnosis not present

## 2023-07-18 DIAGNOSIS — S76122S Laceration of left quadriceps muscle, fascia and tendon, sequela: Secondary | ICD-10-CM | POA: Diagnosis not present

## 2023-07-18 DIAGNOSIS — M25561 Pain in right knee: Secondary | ICD-10-CM | POA: Diagnosis not present

## 2023-07-18 DIAGNOSIS — M25562 Pain in left knee: Secondary | ICD-10-CM | POA: Diagnosis not present

## 2023-07-24 DIAGNOSIS — S76122S Laceration of left quadriceps muscle, fascia and tendon, sequela: Secondary | ICD-10-CM | POA: Diagnosis not present

## 2023-07-24 DIAGNOSIS — S76121S Laceration of right quadriceps muscle, fascia and tendon, sequela: Secondary | ICD-10-CM | POA: Diagnosis not present

## 2023-07-24 DIAGNOSIS — M25561 Pain in right knee: Secondary | ICD-10-CM | POA: Diagnosis not present

## 2023-07-24 DIAGNOSIS — M25562 Pain in left knee: Secondary | ICD-10-CM | POA: Diagnosis not present

## 2023-07-26 DIAGNOSIS — M25562 Pain in left knee: Secondary | ICD-10-CM | POA: Diagnosis not present

## 2023-07-26 DIAGNOSIS — M25561 Pain in right knee: Secondary | ICD-10-CM | POA: Diagnosis not present

## 2023-07-26 DIAGNOSIS — S76122S Laceration of left quadriceps muscle, fascia and tendon, sequela: Secondary | ICD-10-CM | POA: Diagnosis not present

## 2023-07-26 DIAGNOSIS — S76121S Laceration of right quadriceps muscle, fascia and tendon, sequela: Secondary | ICD-10-CM | POA: Diagnosis not present

## 2023-08-02 ENCOUNTER — Other Ambulatory Visit (HOSPITAL_COMMUNITY): Payer: Self-pay

## 2023-08-02 ENCOUNTER — Other Ambulatory Visit: Payer: Self-pay | Admitting: Internal Medicine

## 2023-08-02 DIAGNOSIS — M25561 Pain in right knee: Secondary | ICD-10-CM | POA: Diagnosis not present

## 2023-08-02 DIAGNOSIS — S76121S Laceration of right quadriceps muscle, fascia and tendon, sequela: Secondary | ICD-10-CM | POA: Diagnosis not present

## 2023-08-02 DIAGNOSIS — M25562 Pain in left knee: Secondary | ICD-10-CM | POA: Diagnosis not present

## 2023-08-02 DIAGNOSIS — S76122S Laceration of left quadriceps muscle, fascia and tendon, sequela: Secondary | ICD-10-CM | POA: Diagnosis not present

## 2023-08-05 ENCOUNTER — Other Ambulatory Visit: Payer: Self-pay | Admitting: Internal Medicine

## 2023-08-06 ENCOUNTER — Other Ambulatory Visit (HOSPITAL_COMMUNITY): Payer: Self-pay

## 2023-08-06 MED ORDER — APIXABAN 5 MG PO TABS
5.0000 mg | ORAL_TABLET | Freq: Two times a day (BID) | ORAL | 5 refills | Status: DC
  Filled 2023-08-06: qty 60, 30d supply, fill #0

## 2023-08-07 ENCOUNTER — Ambulatory Visit: Attending: Cardiology | Admitting: Cardiology

## 2023-08-07 DIAGNOSIS — S76102D Unspecified injury of left quadriceps muscle, fascia and tendon, subsequent encounter: Secondary | ICD-10-CM | POA: Diagnosis not present

## 2023-08-08 ENCOUNTER — Telehealth: Payer: Self-pay | Admitting: Nurse Practitioner

## 2023-08-08 DIAGNOSIS — S76121S Laceration of right quadriceps muscle, fascia and tendon, sequela: Secondary | ICD-10-CM | POA: Diagnosis not present

## 2023-08-08 DIAGNOSIS — M25561 Pain in right knee: Secondary | ICD-10-CM | POA: Diagnosis not present

## 2023-08-08 DIAGNOSIS — S76122S Laceration of left quadriceps muscle, fascia and tendon, sequela: Secondary | ICD-10-CM | POA: Diagnosis not present

## 2023-08-08 DIAGNOSIS — S20361A Insect bite (nonvenomous) of right front wall of thorax, initial encounter: Secondary | ICD-10-CM | POA: Diagnosis not present

## 2023-08-08 DIAGNOSIS — M25562 Pain in left knee: Secondary | ICD-10-CM | POA: Diagnosis not present

## 2023-08-08 NOTE — Telephone Encounter (Signed)
 Cmn received from [employer] for Medication/Pharmacy Issues.

## 2023-08-09 DIAGNOSIS — M25562 Pain in left knee: Secondary | ICD-10-CM | POA: Diagnosis not present

## 2023-08-09 DIAGNOSIS — M25462 Effusion, left knee: Secondary | ICD-10-CM | POA: Diagnosis not present

## 2023-08-09 DIAGNOSIS — S76102D Unspecified injury of left quadriceps muscle, fascia and tendon, subsequent encounter: Secondary | ICD-10-CM | POA: Diagnosis not present

## 2023-08-09 DIAGNOSIS — M25561 Pain in right knee: Secondary | ICD-10-CM | POA: Diagnosis not present

## 2023-08-12 ENCOUNTER — Telehealth: Payer: Self-pay

## 2023-08-12 ENCOUNTER — Other Ambulatory Visit: Payer: Self-pay

## 2023-08-12 ENCOUNTER — Other Ambulatory Visit (HOSPITAL_COMMUNITY): Payer: Self-pay

## 2023-08-12 ENCOUNTER — Inpatient Hospital Stay (HOSPITAL_COMMUNITY)

## 2023-08-12 ENCOUNTER — Encounter (HOSPITAL_COMMUNITY): Payer: Self-pay | Admitting: Internal Medicine

## 2023-08-12 ENCOUNTER — Inpatient Hospital Stay (HOSPITAL_COMMUNITY)
Admission: RE | Admit: 2023-08-12 | Discharge: 2023-08-17 | DRG: 857 | Disposition: A | Discharge status: Home Health | Attending: Internal Medicine | Admitting: Internal Medicine

## 2023-08-12 ENCOUNTER — Encounter (HOSPITAL_COMMUNITY): Payer: Self-pay

## 2023-08-12 DIAGNOSIS — J449 Chronic obstructive pulmonary disease, unspecified: Secondary | ICD-10-CM | POA: Diagnosis present

## 2023-08-12 DIAGNOSIS — A499 Bacterial infection, unspecified: Secondary | ICD-10-CM | POA: Diagnosis present

## 2023-08-12 DIAGNOSIS — Z79899 Other long term (current) drug therapy: Secondary | ICD-10-CM | POA: Diagnosis not present

## 2023-08-12 DIAGNOSIS — Z1152 Encounter for screening for COVID-19: Secondary | ICD-10-CM

## 2023-08-12 DIAGNOSIS — S76102D Unspecified injury of left quadriceps muscle, fascia and tendon, subsequent encounter: Secondary | ICD-10-CM | POA: Diagnosis not present

## 2023-08-12 DIAGNOSIS — M00062 Staphylococcal arthritis, left knee: Secondary | ICD-10-CM | POA: Diagnosis not present

## 2023-08-12 DIAGNOSIS — M0000 Staphylococcal arthritis, unspecified joint: Secondary | ICD-10-CM | POA: Diagnosis not present

## 2023-08-12 DIAGNOSIS — Y848 Other medical procedures as the cause of abnormal reaction of the patient, or of later complication, without mention of misadventure at the time of the procedure: Secondary | ICD-10-CM | POA: Diagnosis present

## 2023-08-12 DIAGNOSIS — M25462 Effusion, left knee: Secondary | ICD-10-CM | POA: Diagnosis not present

## 2023-08-12 DIAGNOSIS — I340 Nonrheumatic mitral (valve) insufficiency: Secondary | ICD-10-CM | POA: Diagnosis not present

## 2023-08-12 DIAGNOSIS — T8142XA Infection following a procedure, deep incisional surgical site, initial encounter: Principal | ICD-10-CM | POA: Diagnosis present

## 2023-08-12 DIAGNOSIS — I10 Essential (primary) hypertension: Secondary | ICD-10-CM | POA: Diagnosis not present

## 2023-08-12 DIAGNOSIS — G4700 Insomnia, unspecified: Secondary | ICD-10-CM | POA: Diagnosis present

## 2023-08-12 DIAGNOSIS — M009 Pyogenic arthritis, unspecified: Secondary | ICD-10-CM | POA: Diagnosis not present

## 2023-08-12 DIAGNOSIS — Z7901 Long term (current) use of anticoagulants: Secondary | ICD-10-CM | POA: Diagnosis not present

## 2023-08-12 DIAGNOSIS — Z818 Family history of other mental and behavioral disorders: Secondary | ICD-10-CM

## 2023-08-12 DIAGNOSIS — Z800 Family history of malignant neoplasm of digestive organs: Secondary | ICD-10-CM

## 2023-08-12 DIAGNOSIS — Z8679 Personal history of other diseases of the circulatory system: Secondary | ICD-10-CM

## 2023-08-12 DIAGNOSIS — M25562 Pain in left knee: Secondary | ICD-10-CM | POA: Diagnosis not present

## 2023-08-12 DIAGNOSIS — B9561 Methicillin susceptible Staphylococcus aureus infection as the cause of diseases classified elsewhere: Secondary | ICD-10-CM | POA: Diagnosis not present

## 2023-08-12 DIAGNOSIS — T8149XA Infection following a procedure, other surgical site, initial encounter: Secondary | ICD-10-CM | POA: Diagnosis not present

## 2023-08-12 DIAGNOSIS — Z823 Family history of stroke: Secondary | ICD-10-CM

## 2023-08-12 DIAGNOSIS — R7881 Bacteremia: Principal | ICD-10-CM | POA: Diagnosis present

## 2023-08-12 DIAGNOSIS — L02416 Cutaneous abscess of left lower limb: Secondary | ICD-10-CM | POA: Diagnosis present

## 2023-08-12 DIAGNOSIS — N400 Enlarged prostate without lower urinary tract symptoms: Secondary | ICD-10-CM | POA: Diagnosis present

## 2023-08-12 DIAGNOSIS — J441 Chronic obstructive pulmonary disease with (acute) exacerbation: Secondary | ICD-10-CM | POA: Diagnosis present

## 2023-08-12 DIAGNOSIS — E785 Hyperlipidemia, unspecified: Secondary | ICD-10-CM | POA: Diagnosis present

## 2023-08-12 DIAGNOSIS — B182 Chronic viral hepatitis C: Secondary | ICD-10-CM | POA: Diagnosis present

## 2023-08-12 DIAGNOSIS — Z87891 Personal history of nicotine dependence: Secondary | ICD-10-CM | POA: Diagnosis not present

## 2023-08-12 DIAGNOSIS — Z7951 Long term (current) use of inhaled steroids: Secondary | ICD-10-CM

## 2023-08-12 DIAGNOSIS — I482 Chronic atrial fibrillation, unspecified: Secondary | ICD-10-CM | POA: Diagnosis present

## 2023-08-12 DIAGNOSIS — M00061 Staphylococcal arthritis, right knee: Secondary | ICD-10-CM | POA: Diagnosis not present

## 2023-08-12 DIAGNOSIS — Z01818 Encounter for other preprocedural examination: Secondary | ICD-10-CM | POA: Diagnosis not present

## 2023-08-12 DIAGNOSIS — K219 Gastro-esophageal reflux disease without esophagitis: Secondary | ICD-10-CM | POA: Diagnosis present

## 2023-08-12 DIAGNOSIS — R059 Cough, unspecified: Secondary | ICD-10-CM | POA: Diagnosis present

## 2023-08-12 DIAGNOSIS — Z820 Family history of epilepsy and other diseases of the nervous system: Secondary | ICD-10-CM | POA: Diagnosis not present

## 2023-08-12 DIAGNOSIS — F32A Depression, unspecified: Secondary | ICD-10-CM | POA: Diagnosis not present

## 2023-08-12 DIAGNOSIS — I2510 Atherosclerotic heart disease of native coronary artery without angina pectoris: Secondary | ICD-10-CM | POA: Diagnosis not present

## 2023-08-12 HISTORY — DX: Bacterial infection, unspecified: A49.9

## 2023-08-12 LAB — CBC WITH DIFFERENTIAL/PLATELET
Abs Immature Granulocytes: 0.12 10*3/uL — ABNORMAL HIGH (ref 0.00–0.07)
Basophils Absolute: 0 10*3/uL (ref 0.0–0.1)
Basophils Relative: 0 %
Eosinophils Absolute: 0 10*3/uL (ref 0.0–0.5)
Eosinophils Relative: 0 %
HCT: 43.6 % (ref 39.0–52.0)
Hemoglobin: 14.7 g/dL (ref 13.0–17.0)
Immature Granulocytes: 1 %
Lymphocytes Relative: 4 %
Lymphs Abs: 0.6 10*3/uL — ABNORMAL LOW (ref 0.7–4.0)
MCH: 31.3 pg (ref 26.0–34.0)
MCHC: 33.7 g/dL (ref 30.0–36.0)
MCV: 93 fL (ref 80.0–100.0)
Monocytes Absolute: 1 10*3/uL (ref 0.1–1.0)
Monocytes Relative: 7 %
Neutro Abs: 12.7 10*3/uL — ABNORMAL HIGH (ref 1.7–7.7)
Neutrophils Relative %: 88 %
Platelets: 323 10*3/uL (ref 150–400)
RBC: 4.69 MIL/uL (ref 4.22–5.81)
RDW: 13.2 % (ref 11.5–15.5)
WBC: 14.4 10*3/uL — ABNORMAL HIGH (ref 4.0–10.5)
nRBC: 0 % (ref 0.0–0.2)

## 2023-08-12 LAB — BASIC METABOLIC PANEL WITH GFR
Anion gap: 12 (ref 5–15)
BUN: 33 mg/dL — ABNORMAL HIGH (ref 8–23)
CO2: 24 mmol/L (ref 22–32)
Calcium: 9.5 mg/dL (ref 8.9–10.3)
Chloride: 99 mmol/L (ref 98–111)
Creatinine, Ser: 0.98 mg/dL (ref 0.61–1.24)
GFR, Estimated: >60 mL/min (ref >60)
Glucose, Bld: 126 mg/dL — ABNORMAL HIGH (ref 70–99)
Potassium: 4 mmol/L (ref 3.5–5.1)
Sodium: 135 mmol/L (ref 135–145)

## 2023-08-12 LAB — SEDIMENTATION RATE: Sed Rate: 39 mm/h — ABNORMAL HIGH (ref 0–16)

## 2023-08-12 LAB — TYPE AND SCREEN
ABO/RH(D): A POS
Antibody Screen: NEGATIVE

## 2023-08-12 LAB — PROTIME-INR
INR: 1 (ref 0.8–1.2)
Prothrombin Time: 13.7 s (ref 11.4–15.2)

## 2023-08-12 LAB — SURGICAL PCR SCREEN
MRSA, PCR: NEGATIVE
Staphylococcus aureus: POSITIVE — AB

## 2023-08-12 LAB — C-REACTIVE PROTEIN: CRP: 4.4 mg/dL — ABNORMAL HIGH (ref <1.0)

## 2023-08-12 MED ORDER — VANCOMYCIN HCL 1500 MG/300ML IV SOLN
1500.0000 mg | Freq: Once | INTRAVENOUS | Status: DC
  Filled 2023-08-12: qty 300

## 2023-08-12 MED ORDER — FINASTERIDE 5 MG PO TABS
5.0000 mg | ORAL_TABLET | Freq: Every day | ORAL | Status: DC
  Administered 2023-08-13 – 2023-08-17 (×5): 5 mg via ORAL
  Filled 2023-08-12 (×5): qty 1

## 2023-08-12 MED ORDER — OXYCODONE HCL 5 MG PO TABS
5.0000 mg | ORAL_TABLET | ORAL | Status: DC | PRN
  Administered 2023-08-12: 5 mg via ORAL
  Filled 2023-08-12: qty 1

## 2023-08-12 MED ORDER — PANTOPRAZOLE SODIUM 40 MG PO TBEC
40.0000 mg | DELAYED_RELEASE_TABLET | Freq: Every day | ORAL | Status: DC
  Administered 2023-08-14 – 2023-08-17 (×4): 40 mg via ORAL
  Filled 2023-08-12 (×4): qty 1

## 2023-08-12 MED ORDER — ACETAMINOPHEN 500 MG PO TABS
1000.0000 mg | ORAL_TABLET | Freq: Once | ORAL | Status: AC
  Administered 2023-08-13: 1000 mg via ORAL
  Filled 2023-08-12: qty 2

## 2023-08-12 MED ORDER — POVIDONE-IODINE 10 % EX SWAB: 2.0000 | Freq: Once | CUTANEOUS | Status: DC

## 2023-08-12 MED ORDER — BUPROPION HCL ER (XL) 150 MG PO TB24
150.0000 mg | ORAL_TABLET | Freq: Every day | ORAL | Status: DC
  Administered 2023-08-13 – 2023-08-17 (×5): 150 mg via ORAL
  Filled 2023-08-12 (×5): qty 1

## 2023-08-12 MED ORDER — MUPIROCIN 2 % EX OINT
1.0000 | TOPICAL_OINTMENT | Freq: Two times a day (BID) | CUTANEOUS | Status: AC
  Administered 2023-08-12 – 2023-08-17 (×10): 1 via NASAL
  Filled 2023-08-12 (×5): qty 22

## 2023-08-12 MED ORDER — GUAIFENESIN ER 600 MG PO TB12
600.0000 mg | ORAL_TABLET | Freq: Two times a day (BID) | ORAL | Status: DC
  Administered 2023-08-12 – 2023-08-17 (×8): 600 mg via ORAL
  Filled 2023-08-12 (×9): qty 1

## 2023-08-12 MED ORDER — ENOXAPARIN SODIUM 40 MG/0.4ML IJ SOSY
40.0000 mg | PREFILLED_SYRINGE | INTRAMUSCULAR | Status: DC
  Administered 2023-08-13 – 2023-08-17 (×5): 40 mg via SUBCUTANEOUS
  Filled 2023-08-12 (×5): qty 0.4

## 2023-08-12 MED ORDER — MORPHINE SULFATE (PF) 2 MG/ML IV SOLN
2.0000 mg | INTRAVENOUS | Status: DC | PRN
  Administered 2023-08-12 – 2023-08-13 (×3): 2 mg via INTRAVENOUS
  Filled 2023-08-12 (×3): qty 1

## 2023-08-12 MED ORDER — ONDANSETRON HCL 4 MG PO TABS: 4.0000 mg | ORAL_TABLET | Freq: Four times a day (QID) | ORAL | Status: DC | PRN

## 2023-08-12 MED ORDER — METOPROLOL TARTRATE 25 MG PO TABS
25.0000 mg | ORAL_TABLET | Freq: Two times a day (BID) | ORAL | Status: DC
  Administered 2023-08-12 – 2023-08-17 (×9): 25 mg via ORAL
  Filled 2023-08-12 (×10): qty 1

## 2023-08-12 MED ORDER — FLUTICASONE FUROATE-VILANTEROL 100-25 MCG/ACT IN AEPB
1.0000 | INHALATION_SPRAY | Freq: Every day | RESPIRATORY_TRACT | Status: DC
  Administered 2023-08-14 – 2023-08-17 (×3): 1 via RESPIRATORY_TRACT
  Filled 2023-08-12: qty 28

## 2023-08-12 MED ORDER — CEFAZOLIN SODIUM-DEXTROSE 2-4 GM/100ML-% IV SOLN
2.0000 g | Freq: Three times a day (TID) | INTRAVENOUS | Status: DC
  Administered 2023-08-12 – 2023-08-17 (×16): 2 g via INTRAVENOUS
  Filled 2023-08-12 (×16): qty 100

## 2023-08-12 MED ORDER — METHOCARBAMOL 500 MG PO TABS: 500.0000 mg | ORAL_TABLET | Freq: Three times a day (TID) | ORAL | Status: DC

## 2023-08-12 MED ORDER — ACETAMINOPHEN 325 MG PO TABS
650.0000 mg | ORAL_TABLET | Freq: Four times a day (QID) | ORAL | Status: DC | PRN
  Filled 2023-08-12: qty 2

## 2023-08-12 MED ORDER — ACETAMINOPHEN 650 MG RE SUPP: 650.0000 mg | Freq: Four times a day (QID) | RECTAL | Status: DC | PRN

## 2023-08-12 MED ORDER — CEFAZOLIN SODIUM-DEXTROSE 2-4 GM/100ML-% IV SOLN
2.0000 g | INTRAVENOUS | Status: DC
  Filled 2023-08-12: qty 100

## 2023-08-12 MED ORDER — ZOLPIDEM TARTRATE 5 MG PO TABS
5.0000 mg | ORAL_TABLET | Freq: Every evening | ORAL | Status: DC | PRN
  Administered 2023-08-13 – 2023-08-15 (×2): 5 mg via ORAL
  Filled 2023-08-12 (×2): qty 1

## 2023-08-12 MED ORDER — VANCOMYCIN HCL 1750 MG/350ML IV SOLN
1750.0000 mg | INTRAVENOUS | Status: DC
  Administered 2023-08-12: 1750 mg via INTRAVENOUS
  Filled 2023-08-12 (×2): qty 350

## 2023-08-12 MED ORDER — SENNOSIDES-DOCUSATE SODIUM 8.6-50 MG PO TABS
2.0000 | ORAL_TABLET | Freq: Every day | ORAL | Status: DC
  Administered 2023-08-13 – 2023-08-16 (×4): 2 via ORAL
  Filled 2023-08-12 (×5): qty 2

## 2023-08-12 MED ORDER — POLYETHYLENE GLYCOL 3350 17 G PO PACK
17.0000 g | PACK | Freq: Two times a day (BID) | ORAL | Status: DC
  Administered 2023-08-14: 17 g via ORAL
  Filled 2023-08-12 (×3): qty 1

## 2023-08-12 MED ORDER — SODIUM CHLORIDE 0.9 % IV SOLN: 2.0000 g | INTRAVENOUS | Status: DC

## 2023-08-12 MED ORDER — ONDANSETRON HCL 4 MG/2ML IJ SOLN: 4.0000 mg | Freq: Four times a day (QID) | INTRAMUSCULAR | Status: DC | PRN
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
## 2023-08-12 NOTE — Progress Notes (Addendum)
 Pharmacy Antibiotic Note  Paul [name]. is a 78 y.o. male admitted on 08/12/2023 with left quad tendon repair in feb 2025, ?revision 5 wk ago now with septic arthritis, and deep abscess to left knee.  5/30 - 30mL of fluid of synovial fluid was aspirated.  Pharmacy has been consulted for vancomycin dosing.  6/2 Vancomycin 1750mg  Q 24 hr with Est AUC: 490 Scr used: 0.96 mg/dL; Vd coeff: 0.72 L/kg  Plan: Vanc 1750mg  q24hr Cefazolin  2g q8 Monitor cultures, clinical status, renal function, vancomycin level Narrow abx as able and f/u duration per ID    Height: 6' (182.9 cm) Weight: 81 kg (178 lb 9.6 oz) IBW/kg (Calculated) : 77.6  Temp (24hrs), Avg:98.3 F (36.8 C), Min:98.3 F (36.8 C), Max:98.3 F (36.8 C)  No results for input(s): "WBC", "CREATININE", "LATICACIDVEN", "VANCOTROUGH", "VANCOPEAK", "VANCORANDOM", "GENTTROUGH", "GENTPEAK", "GENTRANDOM", "TOBRATROUGH", "TOBRAPEAK", "TOBRARND", "AMIKACINPEAK", "AMIKACINTROU", "AMIKACIN" in the last 168 hours.  CrCl cannot be calculated (Patient's most recent lab result is older than the maximum 21 days allowed.).    Allergies  Allergen Reactions   Other Itching    PATCHES. Patient states that any patch on his skin causes him to itch     Antimicrobials this admission: vanc  6/2>>  Cefaz 6/2 >>     Dose adjustments this admission: N/a  Microbiology results: 6/2 BCx:  6/2 OR knee: 5/30 synovial fl: GPC  Thank you for allowing pharmacy to be a part of this patient's care.  Dorene Gang, PharmD, BCPS, BCCP Clinical Pharmacist  Please check AMION for all [facility] Pharmacy phone numbers After 10:00 PM, call Main Pharmacy [phone]
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
## 2023-08-12 NOTE — Telephone Encounter (Signed)
 I called patient to know whether he's using the Symbicort  or Trelegy inhaler. He did not answer, left him a detailed message to call us  back.

## 2023-08-12 NOTE — Hospital Course (Addendum)
 Patient with PMH of HTN, HLD, COPD, left hamstring injury presented to the hospital with complaints of infection of the left knee. 2/16 - 2/21 presents with bilateral quadriceps tendon rupture after an injury.  Underwent quadricep tendon repair bilaterally on 2/18.  Discharged from the CIR on 2/25. 4/17 presented to the ED with left knee injury.  Seen in the ED.  Discharged back home.  Seen by PCP 5/6. Somewhere in April seen by orthopedics and underwent revision surgery outpatient. 5/29 sustained another injury on the same left knee where a table.  Had significant swelling.  Went to see orthopedics.  Aspiration was performed.  Per patient clear bloody fluid was drained from the knee with improvement in pain.  Patient was started on prednisone  after this. On 6/2 patient reported that he started draining pus from the left knee area.  He called the orthopedic office and was also notified by them that his knee fluid is growing a bacteria.  Patient was recommended to be admitted to the hospital after evaluation in the clinic. Stopped taking Eliquis  somewhere in early May. Blood culture came back positive for MSSA. Underwent I and D on 6/3.  Assessment and Plan: Septic arthritis  MSSA bacteremia. As above presents with complaints of draining pus from his left knee. Seen by orthopedic in the clinic. Recommend to undergo incision and drainage with washout of his left knee. Underwent IND on 6/3. Continue pain control. Blood culture came back positive for MSSA bacteremia ESR CRP performed. Superficial culture was also performed from drainage of the wound. Highly appreciate ID consultation. Currently on cefazolin .  Echocardiogram ordered.  Cough COPD, mild (HCC) Continue inhalers. Currently no evidence of exacerbation. Chest x-ray ordered for preop evaluation.  History of atrial fibrillation without current medication Currently in sinus rhythm. EKG ordered. Patient does not take any Eliquis   anymore. Continue metoprolol . Continue monitoring on telemetry.  HTN. Home regimen includes metoprolol .  Triamterene -HCTZ.  Valsartan . Also on Lasix  as needed. Currently will continue metoprolol .  Hold other medication.  Dyslipidemia On Lipitor.  Will hold.  Depression On Wellbutrin . Also on Ambien  for insomnia.  Will continue.  HEPATITIS C CARRIER Noted.  GERD (gastroesophageal reflux disease)  Continue PPI.
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
## 2023-08-13 ENCOUNTER — Inpatient Hospital Stay (HOSPITAL_COMMUNITY)

## 2023-08-13 ENCOUNTER — Other Ambulatory Visit (HOSPITAL_COMMUNITY): Payer: Self-pay

## 2023-08-13 ENCOUNTER — Encounter (HOSPITAL_COMMUNITY): Payer: Self-pay | Admitting: Internal Medicine

## 2023-08-13 ENCOUNTER — Encounter (HOSPITAL_COMMUNITY)
Admission: RE | Disposition: A | Discharge status: Home Health | Payer: Self-pay | Source: Ambulatory Visit | Attending: Internal Medicine

## 2023-08-13 ENCOUNTER — Other Ambulatory Visit (HOSPITAL_COMMUNITY)

## 2023-08-13 DIAGNOSIS — M00061 Staphylococcal arthritis, right knee: Secondary | ICD-10-CM

## 2023-08-13 DIAGNOSIS — R7881 Bacteremia: Secondary | ICD-10-CM | POA: Diagnosis not present

## 2023-08-13 DIAGNOSIS — B9561 Methicillin susceptible Staphylococcus aureus infection as the cause of diseases classified elsewhere: Secondary | ICD-10-CM | POA: Diagnosis not present

## 2023-08-13 HISTORY — PX: INCISION AND DRAINAGE OF DEEP ABSCESS, KNEE: SHX7364

## 2023-08-13 LAB — BLOOD CULTURE ID PANEL (REFLEXED) - BCID2

## 2023-08-13 LAB — BASIC METABOLIC PANEL WITH GFR
Anion gap: 9 (ref 5–15)
BUN: 29 mg/dL — ABNORMAL HIGH (ref 8–23)
CO2: 24 mmol/L (ref 22–32)
Calcium: 9.1 mg/dL (ref 8.9–10.3)
Chloride: 105 mmol/L (ref 98–111)
Creatinine, Ser: 0.88 mg/dL (ref 0.61–1.24)
GFR, Estimated: >60 mL/min (ref >60)
Glucose, Bld: 111 mg/dL — ABNORMAL HIGH (ref 70–99)
Potassium: 4.3 mmol/L (ref 3.5–5.1)
Sodium: 138 mmol/L (ref 135–145)

## 2023-08-13 LAB — CBC
HCT: 44.4 % (ref 39.0–52.0)
Hemoglobin: 14.6 g/dL (ref 13.0–17.0)
MCH: 30.7 pg (ref 26.0–34.0)
MCHC: 32.9 g/dL (ref 30.0–36.0)
MCV: 93.5 fL (ref 80.0–100.0)
Platelets: 306 10*3/uL (ref 150–400)
RBC: 4.75 MIL/uL (ref 4.22–5.81)
RDW: 13.3 % (ref 11.5–15.5)
WBC: 14.2 10*3/uL — ABNORMAL HIGH (ref 4.0–10.5)
nRBC: 0 % (ref 0.0–0.2)

## 2023-08-13 LAB — MAGNESIUM: Magnesium: 2 mg/dL (ref 1.7–2.4)

## 2023-08-13 LAB — ABO/RH: ABO/RH(D): A POS

## 2023-08-13 SURGERY — INCISION AND DRAINAGE OF DEEP ABSCESS, KNEE
Anesthesia: General | Site: Knee | Laterality: Left

## 2023-08-13 MED ORDER — PROPOFOL 10 MG/ML IV BOLUS
INTRAVENOUS | Status: AC
Start: 2023-08-13 — End: ?
  Filled 2023-08-13: qty 20

## 2023-08-13 MED ORDER — FENTANYL CITRATE (PF) 100 MCG/2ML IJ SOLN
25.0000 ug | INTRAMUSCULAR | Status: DC | PRN
  Administered 2023-08-13 (×3): 50 ug via INTRAVENOUS

## 2023-08-13 MED ORDER — METHOCARBAMOL 1000 MG/10ML IJ SOLN: 500.0000 mg | Freq: Four times a day (QID) | INTRAMUSCULAR | Status: DC | PRN

## 2023-08-13 MED ORDER — LACTATED RINGERS IV SOLN: INTRAVENOUS | Status: DC

## 2023-08-13 MED ORDER — PROPOFOL 10 MG/ML IV BOLUS
INTRAVENOUS | Status: DC | PRN
  Administered 2023-08-13: 150 mg via INTRAVENOUS

## 2023-08-13 MED ORDER — ONDANSETRON HCL 4 MG/2ML IJ SOLN
INTRAMUSCULAR | Status: AC
  Filled 2023-08-13: qty 2

## 2023-08-13 MED ORDER — FENTANYL CITRATE (PF) 100 MCG/2ML IJ SOLN
50.0000 ug | Freq: Once | INTRAMUSCULAR | Status: AC
  Administered 2023-08-13: 50 ug via INTRAVENOUS

## 2023-08-13 MED ORDER — PHENYLEPHRINE HCL-NACL 20-0.9 MG/250ML-% IV SOLN: INTRAVENOUS | Status: DC | PRN

## 2023-08-13 MED ORDER — FENTANYL CITRATE (PF) 100 MCG/2ML IJ SOLN
INTRAMUSCULAR | Status: AC
  Filled 2023-08-13: qty 2

## 2023-08-13 MED ORDER — METHOCARBAMOL 500 MG PO TABS
500.0000 mg | ORAL_TABLET | Freq: Four times a day (QID) | ORAL | Status: DC | PRN
Start: 2023-08-13 — End: 2023-08-17
  Administered 2023-08-13 – 2023-08-14 (×3): 500 mg via ORAL
  Filled 2023-08-13 (×3): qty 1

## 2023-08-13 MED ORDER — ONDANSETRON HCL 4 MG PO TABS: 4.0000 mg | ORAL_TABLET | Freq: Four times a day (QID) | ORAL | Status: DC | PRN

## 2023-08-13 MED ORDER — BUPIVACAINE HCL (PF) 0.25 % IJ SOLN
INTRAMUSCULAR | Status: AC
Start: 2023-08-13 — End: ?
  Filled 2023-08-13: qty 30

## 2023-08-13 MED ORDER — ONDANSETRON HCL 4 MG/2ML IJ SOLN
4.0000 mg | Freq: Four times a day (QID) | INTRAMUSCULAR | Status: DC | PRN
Start: 2023-08-13 — End: 2023-08-17

## 2023-08-13 MED ORDER — VANCOMYCIN HCL 1000 MG IV SOLR
INTRAVENOUS | Status: AC
  Filled 2023-08-13: qty 20

## 2023-08-13 MED ORDER — DEXAMETHASONE SODIUM PHOSPHATE 10 MG/ML IJ SOLN
INTRAMUSCULAR | Status: AC
  Filled 2023-08-13: qty 1

## 2023-08-13 MED ORDER — METOCLOPRAMIDE HCL 5 MG/ML IJ SOLN: 5.0000 mg | Freq: Three times a day (TID) | INTRAMUSCULAR | Status: DC | PRN

## 2023-08-13 MED ORDER — PHENYLEPHRINE HCL-NACL 20-0.9 MG/250ML-% IV SOLN
INTRAVENOUS | Status: DC | PRN
  Administered 2023-08-13: 160 ug via INTRAVENOUS
  Administered 2023-08-13: 30 ug/min via INTRAVENOUS
  Administered 2023-08-13: 80 ug via INTRAVENOUS

## 2023-08-13 MED ORDER — MUPIROCIN 2 % EX OINT
1.0000 | TOPICAL_OINTMENT | Freq: Two times a day (BID) | CUTANEOUS | 0 refills | Status: AC
  Filled 2023-08-13: qty 60, 30d supply, fill #0
  Filled 2023-08-13: qty 44, 22d supply, fill #0

## 2023-08-13 MED ORDER — FENTANYL CITRATE (PF) 100 MCG/2ML IJ SOLN
INTRAMUSCULAR | Status: AC
Start: 2023-08-13 — End: ?
  Filled 2023-08-13: qty 2

## 2023-08-13 MED ORDER — ONDANSETRON HCL 4 MG/2ML IJ SOLN
INTRAMUSCULAR | Status: DC | PRN
  Administered 2023-08-13: 4 mg via INTRAVENOUS

## 2023-08-13 MED ORDER — LIDOCAINE 2% (20 MG/ML) 5 ML SYRINGE
INTRAMUSCULAR | Status: DC | PRN
  Administered 2023-08-13: 80 mg via INTRAVENOUS

## 2023-08-13 MED ORDER — CHLORHEXIDINE GLUCONATE 0.12 % MT SOLN: 15.0000 mL | Freq: Once | OROMUCOSAL | Status: AC

## 2023-08-13 MED ORDER — ORAL CARE MOUTH RINSE: 15.0000 mL | Freq: Once | OROMUCOSAL | Status: AC

## 2023-08-13 MED ORDER — PROPOFOL 10 MG/ML IV BOLUS
INTRAVENOUS | Status: AC
  Filled 2023-08-13: qty 20

## 2023-08-13 MED ORDER — HYDROMORPHONE HCL 1 MG/ML IJ SOLN
0.5000 mg | INTRAMUSCULAR | Status: DC | PRN
  Administered 2023-08-13 – 2023-08-15 (×5): 1 mg via INTRAVENOUS
  Administered 2023-08-15 (×2): 0.5 mg via INTRAVENOUS
  Administered 2023-08-15 – 2023-08-16 (×5): 1 mg via INTRAVENOUS
  Administered 2023-08-17: 0.5 mg via INTRAVENOUS
  Administered 2023-08-17: 1 mg via INTRAVENOUS
  Filled 2023-08-13 (×13): qty 1

## 2023-08-13 MED ORDER — FENTANYL CITRATE (PF) 250 MCG/5ML IJ SOLN
INTRAMUSCULAR | Status: DC | PRN
Start: 2023-08-13 — End: 2023-08-13
  Administered 2023-08-13: 50 ug via INTRAVENOUS
  Administered 2023-08-13 (×3): 25 ug via INTRAVENOUS

## 2023-08-13 MED ORDER — SODIUM CHLORIDE 0.9 % IR SOLN
Status: DC | PRN
  Administered 2023-08-13 (×2): 3000 mL

## 2023-08-13 MED ORDER — CHLORHEXIDINE GLUCONATE 0.12 % MT SOLN
OROMUCOSAL | Status: AC
  Administered 2023-08-13: 15 mL via OROMUCOSAL
  Filled 2023-08-13: qty 15

## 2023-08-13 MED ORDER — FENTANYL CITRATE (PF) 100 MCG/2ML IJ SOLN
INTRAMUSCULAR | Status: AC
Start: 2023-08-13 — End: 2023-08-14
  Filled 2023-08-13: qty 2

## 2023-08-13 MED ORDER — ACETAMINOPHEN 500 MG PO TABS
1000.0000 mg | ORAL_TABLET | Freq: Four times a day (QID) | ORAL | Status: AC
  Administered 2023-08-13 – 2023-08-14 (×4): 1000 mg via ORAL
  Filled 2023-08-13 (×4): qty 2

## 2023-08-13 MED ORDER — FENTANYL CITRATE (PF) 250 MCG/5ML IJ SOLN
INTRAMUSCULAR | Status: AC
  Filled 2023-08-13: qty 5

## 2023-08-13 MED ORDER — DOCUSATE SODIUM 100 MG PO CAPS
100.0000 mg | ORAL_CAPSULE | Freq: Two times a day (BID) | ORAL | Status: DC
  Administered 2023-08-14 – 2023-08-17 (×7): 100 mg via ORAL
  Filled 2023-08-13 (×8): qty 1

## 2023-08-13 MED ORDER — ONDANSETRON HCL 4 MG/2ML IJ SOLN: 4.0000 mg | Freq: Once | INTRAMUSCULAR | Status: DC | PRN

## 2023-08-13 MED ORDER — CHLORHEXIDINE GLUCONATE 4 % EX SOLN
1.0000 | CUTANEOUS | 1 refills | Status: DC
  Filled 2023-08-13: qty 946, 30d supply, fill #0

## 2023-08-13 MED ORDER — DEXAMETHASONE SODIUM PHOSPHATE 10 MG/ML IJ SOLN
INTRAMUSCULAR | Status: DC | PRN
  Administered 2023-08-13: 10 mg via INTRAVENOUS

## 2023-08-13 MED ORDER — OXYCODONE HCL 5 MG PO TABS
5.0000 mg | ORAL_TABLET | ORAL | Status: DC | PRN
  Administered 2023-08-13 – 2023-08-14 (×4): 5 mg via ORAL
  Filled 2023-08-13 (×4): qty 1

## 2023-08-13 MED ORDER — DIPHENHYDRAMINE HCL 12.5 MG/5ML PO ELIX: 12.5000 mg | ORAL_SOLUTION | ORAL | Status: DC | PRN

## 2023-08-13 MED ORDER — VANCOMYCIN HCL 1000 MG IV SOLR
INTRAVENOUS | Status: DC | PRN
  Administered 2023-08-13: 2000 mg via TOPICAL

## 2023-08-13 MED ORDER — OXYCODONE HCL 5 MG PO TABS: 2.5000 mg | ORAL_TABLET | ORAL | Status: DC | PRN

## 2023-08-13 MED ORDER — LIDOCAINE 2% (20 MG/ML) 5 ML SYRINGE
INTRAMUSCULAR | Status: AC
  Filled 2023-08-13: qty 5

## 2023-08-13 MED ORDER — METOCLOPRAMIDE HCL 5 MG PO TABS: 5.0000 mg | ORAL_TABLET | Freq: Three times a day (TID) | ORAL | Status: DC | PRN

## 2023-08-13 SURGICAL SUPPLY — 53 items
BAG COUNTER SPONGE SURGICOUNT (BAG) ×1 IMPLANT
BANDAGE ESMARK 6X9 LF (GAUZE/BANDAGES/DRESSINGS) IMPLANT
BLADE SURG 10 STRL SS (BLADE) ×1 IMPLANT
BNDG COHESIVE 4X5 TAN STRL LF (GAUZE/BANDAGES/DRESSINGS) ×1 IMPLANT
BNDG COMPR ESMARK 4X3 LF (GAUZE/BANDAGES/DRESSINGS) IMPLANT
BNDG ELASTIC 4X5.8 VLCR STR LF (GAUZE/BANDAGES/DRESSINGS) ×1 IMPLANT
BNDG ELASTIC 6INX 5YD STR LF (GAUZE/BANDAGES/DRESSINGS) ×1 IMPLANT
BNDG GAUZE DERMACEA FLUFF 4 (GAUZE/BANDAGES/DRESSINGS) ×1 IMPLANT
CNTNR URN SCR LID CUP LEK RST (MISCELLANEOUS) IMPLANT
COVER SURGICAL LIGHT HANDLE (MISCELLANEOUS) ×1 IMPLANT
CUFF TOURN SGL LL 12 NO SLV (MISCELLANEOUS) IMPLANT
CUFF TRNQT CYL 34X4.125X (TOURNIQUET CUFF) IMPLANT
DRAPE SURG 17X23 STRL (DRAPES) IMPLANT
DRAPE U-SHAPE 47X51 STRL (DRAPES) IMPLANT
DRSG ADAPTIC 3X8 NADH LF (GAUZE/BANDAGES/DRESSINGS) IMPLANT
DURAPREP 26ML APPLICATOR (WOUND CARE) ×1 IMPLANT
ELECTRODE REM PT RTRN 9FT ADLT (ELECTROSURGICAL) IMPLANT
EVACUATOR 1/8 PVC DRAIN (DRAIN) IMPLANT
FACESHIELD WRAPAROUND (MASK) ×1 IMPLANT
FACESHIELD WRAPAROUND OR TEAM (MASK) ×1 IMPLANT
GAUZE PAD ABD 8X10 STRL (GAUZE/BANDAGES/DRESSINGS) ×1 IMPLANT
GAUZE SPONGE 4X4 12PLY STRL (GAUZE/BANDAGES/DRESSINGS) ×1 IMPLANT
GAUZE XEROFORM 1X8 LF (GAUZE/BANDAGES/DRESSINGS) ×1 IMPLANT
GLOVE BIO SURGEON STRL SZ7.5 (GLOVE) ×1 IMPLANT
GLOVE BIOGEL PI IND STRL 7.5 (GLOVE) ×1 IMPLANT
GLOVE BIOGEL PI IND STRL 8 (GLOVE) ×1 IMPLANT
GLOVE SURG SYN 7.5 E (GLOVE) ×1 IMPLANT
GLOVE SURG SYN 7.5 PF PI (GLOVE) ×1 IMPLANT
GOWN STRL REUS W/ TWL LRG LVL3 (GOWN DISPOSABLE) ×2 IMPLANT
GOWN STRL REUS W/ TWL XL LVL3 (GOWN DISPOSABLE) ×2 IMPLANT
IMMOBILIZER KNEE 22 UNIV (SOFTGOODS) IMPLANT
KIT BASIN OR (CUSTOM PROCEDURE TRAY) ×1 IMPLANT
KIT TURNOVER KIT B (KITS) ×1 IMPLANT
MANIFOLD NEPTUNE II (INSTRUMENTS) ×1 IMPLANT
NDL HYPO 25GX1X1/2 BEV (NEEDLE) IMPLANT
NEEDLE HYPO 25GX1X1/2 BEV (NEEDLE) IMPLANT
NS IRRIG 1000ML POUR BTL (IV SOLUTION) ×1 IMPLANT
PACK ORTHO EXTREMITY (CUSTOM PROCEDURE TRAY) ×1 IMPLANT
PAD ARMBOARD POSITIONER FOAM (MISCELLANEOUS) ×2 IMPLANT
PADDING CAST COTTON 6X4 STRL (CAST SUPPLIES) IMPLANT
SET CYSTO W/LG BORE CLAMP LF (SET/KITS/TRAYS/PACK) IMPLANT
SET HNDPC FAN SPRY TIP SCT (DISPOSABLE) IMPLANT
SPONGE T-LAP 18X18 ~~LOC~~+RFID (SPONGE) IMPLANT
STOCKINETTE IMPERVIOUS 9X36 MD (GAUZE/BANDAGES/DRESSINGS) ×1 IMPLANT
SUT ETHILON 3 0 PS 1 (SUTURE) IMPLANT
SUT PDS AB 2-0 CT1 27 (SUTURE) IMPLANT
SWAB CULTURE ESWAB REG 1ML (MISCELLANEOUS) IMPLANT
SYR CONTROL 10ML LL (SYRINGE) IMPLANT
TOWEL GREEN STERILE (TOWEL DISPOSABLE) ×1 IMPLANT
TOWEL GREEN STERILE FF (TOWEL DISPOSABLE) ×1 IMPLANT
TUBE CONNECTING 12X1/4 (SUCTIONS) ×1 IMPLANT
UNDERPAD 30X36 HEAVY ABSORB (UNDERPADS AND DIAPERS) ×1 IMPLANT
YANKAUER SUCT BULB TIP NO VENT (SUCTIONS) ×1 IMPLANT

## 2023-08-13 NOTE — Progress Notes (Signed)
 First attempt to get report from 5N-08 nurse.

## 2023-08-13 NOTE — Transfer of Care (Signed)
 Immediate Anesthesia Transfer of Care Note  Patient: Paul [name].  Procedure(s) Performed: INCISION AND DRAINAGE OF DEEP ABSCESS, KNEE (Left: Knee)  Patient Location: PACU  Anesthesia Type:General  Level of Consciousness: awake, alert , and oriented  Airway & Oxygen Therapy: Patient Spontanous Breathing and Patient connected to face mask oxygen  Post-op Assessment: Report given to RN and Post -op Vital signs reviewed and stable  Post vital signs: Reviewed and stable  Last Vitals:  Vitals Value Taken Time  BP 144/81 08/13/23 1630  Temp 36.6 C 08/13/23 1625  Pulse 66 08/13/23 1632  Resp 17 08/13/23 1632  SpO2 96 % 08/13/23 1632  Vitals shown include unfiled device data.  Last Pain:  Vitals:   08/13/23 1631  TempSrc:   PainSc: 9          Complications: No notable events documented.

## 2023-08-13 NOTE — Progress Notes (Signed)
 [facility] Progress Note Patient: Paul [name]. ZOX:[id number] DOB: [birth year] DOA: 08/12/2023  DOS: the patient was seen and examined on 08/13/2023  Brief Hospital Course: Patient with PMH of HTN, HLD, COPD, left hamstring injury presented to the hospital with complaints of infection of the left knee. 2/16 - 2/21 presents with bilateral quadriceps tendon rupture after an injury.  Underwent quadricep tendon repair bilaterally on 2/18.  Discharged from the CIR on 2/25. 4/17 presented to the ED with left knee injury.  Seen in the ED.  Discharged back home.  Seen by PCP 5/6. Somewhere in April seen by orthopedics and underwent revision surgery outpatient. 5/29 sustained another injury on the same left knee where a table.  Had significant swelling.  Went to see orthopedics.  Aspiration was performed.  Per patient clear bloody fluid was drained from the knee with improvement in pain.  Patient was started on prednisone  after this. On 6/2 patient reported that he started draining pus from the left knee area.  He called the orthopedic office and was also notified by them that his knee fluid is growing a bacteria.  Patient was recommended to be admitted to the hospital after evaluation in the clinic. Stopped taking Eliquis  somewhere in early May. Blood culture came back positive for MSSA. Underwent I and D on 6/3.  Assessment and Plan: Septic arthritis  MSSA bacteremia. As above presents with complaints of draining pus from his left knee. Seen by orthopedic in the clinic. Recommend to undergo incision and drainage with washout of his left knee. Underwent IND on 6/3. Continue pain control. Blood culture came back positive for MSSA bacteremia ESR CRP performed. Superficial culture was also performed from drainage of the wound. Highly appreciate ID consultation. Currently on cefazolin .  Echocardiogram ordered.  Cough COPD, mild (HCC) Continue inhalers. Currently no evidence of  exacerbation. Chest x-ray ordered for preop evaluation.  History of atrial fibrillation without current medication Currently in sinus rhythm. EKG ordered. Patient does not take any Eliquis  anymore. Continue metoprolol . Continue monitoring on telemetry.  HTN. Home regimen includes metoprolol .  Triamterene -HCTZ.  Valsartan . Also on Lasix  as needed. Currently will continue metoprolol .  Hold other medication.  Dyslipidemia On Lipitor.  Will hold.  Depression On Wellbutrin . Also on Ambien  for insomnia.  Will continue.  HEPATITIS C CARRIER Noted.  GERD (gastroesophageal reflux disease)  Continue PPI.   Subjective: No nausea no vomiting.  Pain well-controlled.  Drainage improving.  Physical Exam: General: in Mild distress, No Rash Cardiovascular: S1 and S2 Present, No Murmur Respiratory: Good respiratory effort, Bilateral Air entry present. No Crackles, No wheezes Abdomen: Bowel Sound present, No tenderness Extremities: Left knee edema Neuro: Alert and oriented x3, no new focal deficit  Data Reviewed: I have Reviewed nursing notes, Vitals, and Lab results. Since last encounter, pertinent lab results CBC and BMP   . I have ordered test including CBC and BMP  . I have discussed pt's care plan and test results with ID  .   Disposition: Status is: Inpatient Remains inpatient appropriate because: Requiring IV antibiotic and further workup for MSSA bacteremia  enoxaparin  (LOVENOX ) injection 40 mg Start: 08/13/23 1800 SCDs Start: 08/13/23 1727   Family Communication: No one at bedside Level of care: Telemetry Medical   Vitals:   08/13/23 1631 08/13/23 1640 08/13/23 1645 08/13/23 1700  BP:   137/73 135/77  Pulse: 68 65 60 62  Resp: 16 10 14 16   Temp:    97.9 F (36.6 C)  TempSrc:  SpO2: 96% 94% 94% 94%  Weight:      Height:         Author: Charlean Congress, MD 08/13/2023 8:01 PM  Please look on [email or web address] to find out who is on call.

## 2023-08-13 NOTE — Progress Notes (Signed)
 PHARMACY - PHYSICIAN COMMUNICATION CRITICAL VALUE ALERT - BLOOD CULTURE IDENTIFICATION (BCID)  Paul [name]. is an 78 y.o. male who presented to [facility] on 08/12/2023 with a chief complaint of L-knee septic joint  Assessment:  1 YOM with R-knee septic arthritis/abscess and now with 2 of 4 blood cultures growing GPC in clusters with BCID detecting MSSA.  Name of physician (or Provider) Contacted: Levern Reader  Current antibiotics: Vancomycin + Cefazolin   Changes to prescribed antibiotics recommended:  D/c Vancomycin Continue Cefazolin  2g IV every 8 hours  Results for orders placed or performed during the hospital encounter of 08/12/23  Blood Culture ID Panel (Reflexed) (Collected: 08/12/2023  6:16 PM)  Result Value Ref Range   Enterococcus faecalis NOT DETECTED NOT DETECTED   Enterococcus Faecium NOT DETECTED NOT DETECTED   Listeria monocytogenes NOT DETECTED NOT DETECTED   Staphylococcus species DETECTED (A) NOT DETECTED   Staphylococcus aureus (BCID) DETECTED (A) NOT DETECTED   Staphylococcus epidermidis NOT DETECTED NOT DETECTED   Staphylococcus lugdunensis NOT DETECTED NOT DETECTED   Streptococcus species NOT DETECTED NOT DETECTED   Streptococcus agalactiae NOT DETECTED NOT DETECTED   Streptococcus pneumoniae NOT DETECTED NOT DETECTED   Streptococcus pyogenes NOT DETECTED NOT DETECTED   A.calcoaceticus-baumannii NOT DETECTED NOT DETECTED   Bacteroides fragilis NOT DETECTED NOT DETECTED   Enterobacterales NOT DETECTED NOT DETECTED   Enterobacter cloacae complex NOT DETECTED NOT DETECTED   Escherichia coli NOT DETECTED NOT DETECTED   Klebsiella aerogenes NOT DETECTED NOT DETECTED   Klebsiella oxytoca NOT DETECTED NOT DETECTED   Klebsiella pneumoniae NOT DETECTED NOT DETECTED   Proteus species NOT DETECTED NOT DETECTED   Salmonella species NOT DETECTED NOT DETECTED   Serratia marcescens NOT DETECTED NOT DETECTED   Haemophilus influenzae NOT DETECTED NOT DETECTED   Neisseria  meningitidis NOT DETECTED NOT DETECTED   Pseudomonas aeruginosa NOT DETECTED NOT DETECTED   Stenotrophomonas maltophilia NOT DETECTED NOT DETECTED   Candida albicans NOT DETECTED NOT DETECTED   Candida auris NOT DETECTED NOT DETECTED   Candida glabrata NOT DETECTED NOT DETECTED   Candida krusei NOT DETECTED NOT DETECTED   Candida parapsilosis NOT DETECTED NOT DETECTED   Candida tropicalis NOT DETECTED NOT DETECTED   Cryptococcus neoformans/gattii NOT DETECTED NOT DETECTED   Meth resistant mecA/C and MREJ NOT DETECTED NOT DETECTED    Thank you for allowing pharmacy to be a part of this patient's care.  Garland Junk, PharmD, BCPS, BCIDP Infectious Diseases Clinical Pharmacist 08/13/2023 1:22 PM   **Pharmacist phone directory can now be found on [email or web address] ([email or web address]).  Listed under [facility].

## 2023-08-13 NOTE — Interval H&P Note (Signed)
 History and Physical Interval Note:  08/13/2023 2:22 PM  Paul [name].  has presented today for surgery, with the diagnosis of POST OP INFECTION LEFT KNEE.  The various methods of treatment have been discussed with the patient and family. After consideration of risks, benefits and other options for treatment, the patient has consented to  Procedure(s): INCISION AND DRAINAGE OF DEEP ABSCESS, KNEE (Left) as a surgical intervention.  The patient's history has been reviewed, patient examined, no change in status, stable for surgery.  I have reviewed the patient's chart and labs.  Questions were answered to the patient's satisfaction.     Saundra Curl

## 2023-08-13 NOTE — Progress Notes (Signed)
 [facility]  Date of Admission:  08/12/2023     Reason for Follow Up: Septic arthritis (HCC)  Total days of antibiotics 2         ASSESSMENT:  Paul [name] blood cultures are positive for MSSA bacteremia in the setting of septic arthritis of the right knee with abscess.  Scheduled for OR debridement today.  Discussed plan of care for surgical intervention and continued antibiotics.  Obtain blood cultures to check for clearance of bacteremia.  Will need to hold central line placement until bacteremia is cleared.  Obtain TTE for endocarditis and may need TEE.  Change antibiotics to cefazolin .  Continue universal/standard precautions.  Anticipate will need prolonged course of antibiotics for least 4 weeks for septic arthritis and possibly 6 weeks depending upon additional workup.  Remaining medical and supportive care per internal medicine.  PLAN:  Change antibiotics to cefazolin . Surgical intervention per orthopedics Obtain blood cultures for clearance of bacteremia Hold central line until bacteremia is cleared. Obtain TTE to check for endocarditis and may need TEE Universal/standard precautions. Remaining medical and supportive care per internal medicine.  Principal Problem:   Septic arthritis (HCC) Active Problems:   MSSA bacteremia   Dyslipidemia   Depression   HEPATITIS C CARRIER   GERD (gastroesophageal reflux disease)   Cough   COPD, mild (HCC)   Gram positive bacterial infection   History of atrial fibrillation without current medication    acetaminophen   1,000 mg Oral Once   buPROPion   150 mg Oral Daily   enoxaparin  (LOVENOX ) injection  40 mg Subcutaneous Q24H   finasteride   5 mg Oral Daily   fluticasone  furoate-vilanterol  1 puff Inhalation Daily   guaiFENesin  600 mg Oral BID   metoprolol  tartrate  25 mg Oral BID   mupirocin ointment  1 Application Nasal BID   pantoprazole   40 mg Oral Daily   polyethylene glycol  17 g Oral BID    povidone-iodine   2 Application Topical Once   senna-docusate  2 tablet Oral QHS    SUBJECTIVE:  Afebrile overnight with no acute events.  Scheduled for surgery today.  Tolerating antibiotics with no adverse side effects.  Allergies  Allergen Reactions   Other Itching    PATCHES. Patient states that any patch on his skin causes him to itch      Review of Systems: Review of Systems  Constitutional:  Negative for chills, fever and weight loss.  Respiratory:  Negative for cough, shortness of breath and wheezing.   Cardiovascular:  Negative for chest pain and leg swelling.  Gastrointestinal:  Negative for abdominal pain, constipation, diarrhea, nausea and vomiting.  Skin:  Negative for rash.      OBJECTIVE: Vitals:   08/12/23 1610 08/12/23 1951 08/13/23 0456 08/13/23 0836  BP: 137/81 135/66 126/77 120/77  Pulse: 85 81 (!) 44 70  Resp: 16 16 16 15   Temp: 98.3 F (36.8 C) (!) 97.3 F (36.3 C) (!) 97.3 F (36.3 C) 98.1 F (36.7 C)  TempSrc: Oral  Oral   SpO2: 97% 99% (!) 81% 98%  Weight:      Height:       Body mass index is 24.22 kg/m.  Physical Exam Constitutional:      General: Paul [name] is not in acute distress.    Appearance: Paul [name] is well-developed.  Cardiovascular:     Rate and Rhythm: Normal rate and regular rhythm.     Heart sounds: Normal heart sounds.  Pulmonary:  Effort: Pulmonary effort is normal.     Breath sounds: Normal breath sounds.  Musculoskeletal:     Comments: Left knee with obvious edema and mild redness with no deformity.  Skin:    General: Skin is warm and dry.  Neurological:     Mental Status: Paul [name] is alert and oriented to person, place, and time.     Lab Results Lab Results  Component Value Date   WBC 14.4 (H) 08/12/2023   HGB 14.7 08/12/2023   HCT 43.6 08/12/2023   MCV 93.0 08/12/2023   PLT 323 08/12/2023    Lab Results  Component Value Date   CREATININE 0.98 08/12/2023   BUN 33 (H) 08/12/2023   NA 135 08/12/2023   K 4.0  08/12/2023   CL 99 08/12/2023   CO2 24 08/12/2023    Lab Results  Component Value Date   ALT 31 05/28/2023   AST 39 05/28/2023   ALKPHOS 83 05/28/2023   BILITOT 0.5 05/28/2023     Microbiology: Recent Results (from the past 240 hours)  Surgical PCR screen     Status: Abnormal   Collection Time: 08/12/23  4:25 PM   Specimen: Nasal Mucosa; Nasal Swab  Result Value Ref Range Status   MRSA, PCR NEGATIVE NEGATIVE Final   Staphylococcus aureus POSITIVE (A) NEGATIVE Final    Comment: (NOTE) The Xpert SA Assay (FDA approved for NASAL specimens in patients 80 years of age and older), is one component of a comprehensive surveillance program. It is not intended to diagnose infection nor to guide or monitor treatment. Performed at [facility], [address], [place], [place] [address]   Aerobic Culture w Gram Stain (superficial specimen)     Status: None (Preliminary result)   Collection Time: 08/12/23  4:58 PM   Specimen: KNEE  Result Value Ref Range Status   Specimen Description KNEE  Final   Special Requests NONE  Final   Gram Stain   Final    FEW WBC PRESENT, PREDOMINANTLY PMN RARE GRAM POSITIVE COCCI IN PAIRS Performed at [facility], [address] [place], [place] [address]    Culture PENDING  Incomplete   Report Status PENDING  Incomplete  Culture, blood (Routine X 2) w Reflex to ID Panel     Status: None (Preliminary result)   Collection Time: 08/12/23  6:07 PM   Specimen: BLOOD  Result Value Ref Range Status   Specimen Description BLOOD SITE NOT SPECIFIED  Final   Special Requests   Final    BOTTLES DRAWN AEROBIC AND ANAEROBIC Blood Culture results may not be optimal due to an inadequate volume of blood received in culture bottles   Culture  Setup Time   Final    GRAM POSITIVE COCCI IN CLUSTERS IN BOTH AEROBIC AND ANAEROBIC BOTTLES CRITICAL VALUE NOTED.  VALUE IS CONSISTENT WITH PREVIOUSLY REPORTED AND CALLED VALUE. Performed at [facility]  Lab, [address] [place], [place] [address]    Culture GRAM POSITIVE COCCI  Final   Report Status PENDING  Incomplete  Culture, blood (Routine X 2) w Reflex to ID Panel     Status: None (Preliminary result)   Collection Time: 08/12/23  6:16 PM   Specimen: BLOOD  Result Value Ref Range Status   Specimen Description BLOOD SITE NOT SPECIFIED  Final   Special Requests   Final    BOTTLES DRAWN AEROBIC AND ANAEROBIC Blood Culture results may not be optimal due to an inadequate volume  of blood received in culture bottles   Culture  Setup Time   Final    GRAM POSITIVE COCCI IN CLUSTERS IN BOTH AEROBIC AND ANAEROBIC BOTTLES CRITICAL RESULT CALLED TO, READ BACK BY AND VERIFIED WITH: PHARMD ELIZABETH MARTIN ON 08/13/23 @ 1232 BY DRT Performed at [facility], [address], [place], [place] [address]    Culture GRAM POSITIVE COCCI  Final   Report Status PENDING  Incomplete  Blood Culture ID Panel (Reflexed)     Status: Abnormal   Collection Time: 08/12/23  6:16 PM  Result Value Ref Range Status   Enterococcus faecalis NOT DETECTED NOT DETECTED Final   Enterococcus Faecium NOT DETECTED NOT DETECTED Final   Listeria monocytogenes NOT DETECTED NOT DETECTED Final   Staphylococcus species DETECTED (A) NOT DETECTED Final    Comment: CRITICAL RESULT CALLED TO, READ BACK BY AND VERIFIED WITH: PHARMD ELIZABETH MARTIN ON 08/13/23 @ 1232 BY DRT    Staphylococcus aureus (BCID) DETECTED (A) NOT DETECTED Final    Comment: CRITICAL RESULT CALLED TO, READ BACK BY AND VERIFIED WITH: PHARMD ELIZABETH MARTIN ON 08/13/23 @ 1232 BY DRT    Staphylococcus epidermidis NOT DETECTED NOT DETECTED Final   Staphylococcus lugdunensis NOT DETECTED NOT DETECTED Final   Streptococcus species NOT DETECTED NOT DETECTED Final   Streptococcus agalactiae NOT DETECTED NOT DETECTED Final   Streptococcus pneumoniae NOT DETECTED NOT DETECTED Final   Streptococcus pyogenes NOT DETECTED NOT DETECTED Final    A.calcoaceticus-baumannii NOT DETECTED NOT DETECTED Final   Bacteroides fragilis NOT DETECTED NOT DETECTED Final   Enterobacterales NOT DETECTED NOT DETECTED Final   Enterobacter cloacae complex NOT DETECTED NOT DETECTED Final   Escherichia coli NOT DETECTED NOT DETECTED Final   Klebsiella aerogenes NOT DETECTED NOT DETECTED Final   Klebsiella oxytoca NOT DETECTED NOT DETECTED Final   Klebsiella pneumoniae NOT DETECTED NOT DETECTED Final   Proteus species NOT DETECTED NOT DETECTED Final   Salmonella species NOT DETECTED NOT DETECTED Final   Serratia marcescens NOT DETECTED NOT DETECTED Final   Haemophilus influenzae NOT DETECTED NOT DETECTED Final   Neisseria meningitidis NOT DETECTED NOT DETECTED Final   Pseudomonas aeruginosa NOT DETECTED NOT DETECTED Final   Stenotrophomonas maltophilia NOT DETECTED NOT DETECTED Final   Candida albicans NOT DETECTED NOT DETECTED Final   Candida auris NOT DETECTED NOT DETECTED Final   Candida glabrata NOT DETECTED NOT DETECTED Final   Candida krusei NOT DETECTED NOT DETECTED Final   Candida parapsilosis NOT DETECTED NOT DETECTED Final   Candida tropicalis NOT DETECTED NOT DETECTED Final   Cryptococcus neoformans/gattii NOT DETECTED NOT DETECTED Final   Meth resistant mecA/C and MREJ NOT DETECTED NOT DETECTED Final    Comment: Performed at [facility], [address] [place], [place] [address]   I have personally spent 25 minutes involved in face-to-face and non-face-to-face activities for this patient on the day of the visit. Professional time spent includes the following activities: preparing to see the patient (review of tests), performing a medically appropriate examination, ordering medications/tests/procedures, communicating with other health care professionals, documenting clinical information in the EMR, communicating results and counseling/educating  patient and care coordination.    Greg Mayank Teuscher, NP [facility]  08/13/2023  1:00 PM

## 2023-08-13 NOTE — Progress Notes (Signed)
 PT Cancellation Note  Patient Details Name: Paul [name]. MRN: [id number] DOB: 16-May-1945   Cancelled Treatment:    Reason Eval/Treat Not Completed: Patient at procedure or test/unavailable. PT will follow up tomorrow.   Rexie Catena 08/13/2023, 4:22 PM

## 2023-08-13 NOTE — Anesthesia Postprocedure Evaluation (Signed)
 Anesthesia Post Note  Patient: Paul [name].  Procedure(s) Performed: INCISION AND DRAINAGE OF DEEP ABSCESS, KNEE (Left: Knee)     Patient location during evaluation: PACU Anesthesia Type: General Level of consciousness: awake and alert Pain management: pain level controlled Vital Signs Assessment: post-procedure vital signs reviewed and stable Respiratory status: spontaneous breathing, nonlabored ventilation, respiratory function stable and patient connected to nasal cannula oxygen Cardiovascular status: blood pressure returned to baseline and stable Postop Assessment: no apparent nausea or vomiting Anesthetic complications: no  No notable events documented.  Last Vitals:  Vitals:   08/13/23 1645 08/13/23 1700  BP: 137/73 135/77  Pulse: 60 62  Resp: 14 16  Temp:  36.6 C  SpO2: 94% 94%    Last Pain:  Vitals:   08/13/23 1802  TempSrc:   PainSc: 7                  Rosalita Combe

## 2023-08-13 NOTE — Progress Notes (Incomplete)
 PHARMACY CONSULT NOTE FOR:  OUTPATIENT  PARENTERAL ANTIBIOTIC THERAPY (OPAT)  Indication:  Regimen:  End date:   IV antibiotic discharge orders are pended. To discharging provider:  please sign these orders via discharge navigator,  Select New Orders & click on the button choice - Manage This Unsigned Work.     Thank you for allowing pharmacy to be a part of this patient's care.  Vangie Genet 08/13/2023, 8:49 AM

## 2023-08-13 NOTE — Anesthesia Procedure Notes (Addendum)
 Procedure Name: LMA Insertion Date/Time: 08/13/2023 3:24 PM  Performed by: Candance Certain, CRNAPre-anesthesia Checklist: Patient identified, Emergency Drugs available, Suction available and Patient being monitored Patient Re-evaluated:Patient Re-evaluated prior to induction Oxygen Delivery Method: Circle System Utilized Preoxygenation: Pre-oxygenation with 100% oxygen Induction Type: IV induction Ventilation: Mask ventilation without difficulty LMA: LMA inserted LMA Size: 4.0 Number of attempts: 1 Airway Equipment and Method: Bite block Placement Confirmation: positive ETCO2 Tube secured with: Tape Dental Injury: Teeth and Oropharynx as per pre-operative assessment  Comments: LMA placed by [facility]

## 2023-08-13 NOTE — Anesthesia Preprocedure Evaluation (Signed)
 Anesthesia Evaluation  Patient identified by MRN, date of birth, ID band Patient awake    Reviewed: Allergy & Precautions, NPO status , Patient's Chart, lab work & pertinent test results  Airway Mallampati: II  TM Distance: >3 FB Neck ROM: Full    Dental  (+) Teeth Intact, Dental Advisory Given   Pulmonary asthma , COPD, former smoker   Pulmonary exam normal breath sounds clear to auscultation       Cardiovascular hypertension, Pt. on medications + CAD  Normal cardiovascular exam Rhythm:Regular Rate:Normal     Neuro/Psych  Headaches PSYCHIATRIC DISORDERS Anxiety Depression     Neuromuscular disease    GI/Hepatic ,GERD  Medicated and Controlled,,(+) Hepatitis -, C  Endo/Other  negative endocrine ROS    Renal/GU negative Renal ROS     Musculoskeletal  (+) Arthritis ,  POST OP INFECTION LEFT KNEE   Abdominal   Peds  (+) ATTENTION DEFICIT DISORDER WITHOUT HYPERACTIVITY Hematology negative hematology ROS (+)   Anesthesia Other Findings Day of surgery medications reviewed with the patient.  Reproductive/Obstetrics                              Anesthesia Physical Anesthesia Plan  ASA: 3  Anesthesia Plan: General   Post-op Pain Management: Tylenol  PO (pre-op)*   Induction: Intravenous  PONV Risk Score and Plan: 2 and Dexamethasone  and Ondansetron   Airway Management Planned: LMA  Additional Equipment:   Intra-op Plan:   Post-operative Plan: Extubation in OR  Informed Consent: I have reviewed the patients History and Physical, chart, labs and discussed the procedure including the risks, benefits and alternatives for the proposed anesthesia with the patient or authorized representative who has indicated his/her understanding and acceptance.     Dental advisory given  Plan Discussed with: CRNA  Anesthesia Plan Comments:          Anesthesia Quick Evaluation

## 2023-08-13 NOTE — Interval H&P Note (Signed)
 History and Physical Interval Note:  08/13/2023 6:46 AM  Paul [name].  has presented today for surgery, with the diagnosis of POST OP INFECTION LEFT KNEE.  The various methods of treatment have been discussed with the patient and family. After consideration of risks, benefits and other options for treatment, the patient has consented to  Procedure(s): INCISION AND DRAINAGE OF DEEP ABSCESS, KNEE (Left) as a surgical intervention.  The patient's history has been reviewed, patient examined, no change in status, stable for surgery.  I have reviewed the patient's chart and labs.  Questions were answered to the patient's satisfaction.     Saundra Curl

## 2023-08-14 ENCOUNTER — Encounter (HOSPITAL_COMMUNITY): Payer: Self-pay | Admitting: Orthopedic Surgery

## 2023-08-14 ENCOUNTER — Inpatient Hospital Stay (HOSPITAL_COMMUNITY)

## 2023-08-14 DIAGNOSIS — R7881 Bacteremia: Secondary | ICD-10-CM

## 2023-08-14 DIAGNOSIS — M0000 Staphylococcal arthritis, unspecified joint: Secondary | ICD-10-CM | POA: Diagnosis not present

## 2023-08-14 DIAGNOSIS — B9561 Methicillin susceptible Staphylococcus aureus infection as the cause of diseases classified elsewhere: Secondary | ICD-10-CM | POA: Diagnosis not present

## 2023-08-14 DIAGNOSIS — M009 Pyogenic arthritis, unspecified: Secondary | ICD-10-CM | POA: Diagnosis not present

## 2023-08-14 LAB — CBC WITH DIFFERENTIAL/PLATELET
Abs Immature Granulocytes: 0.2 10*3/uL — ABNORMAL HIGH (ref 0.00–0.07)
Basophils Absolute: 0 10*3/uL (ref 0.0–0.1)
Basophils Relative: 0 %
Eosinophils Absolute: 0 10*3/uL (ref 0.0–0.5)
Eosinophils Relative: 0 %
HCT: 41.4 % (ref 39.0–52.0)
Hemoglobin: 13.7 g/dL (ref 13.0–17.0)
Immature Granulocytes: 1 %
Lymphocytes Relative: 8 %
Lymphs Abs: 1.2 10*3/uL (ref 0.7–4.0)
MCH: 31 pg (ref 26.0–34.0)
MCHC: 33.1 g/dL (ref 30.0–36.0)
MCV: 93.7 fL (ref 80.0–100.0)
Monocytes Absolute: 1.4 10*3/uL — ABNORMAL HIGH (ref 0.1–1.0)
Monocytes Relative: 9 %
Neutro Abs: 11.9 10*3/uL — ABNORMAL HIGH (ref 1.7–7.7)
Neutrophils Relative %: 82 %
Platelets: 295 10*3/uL (ref 150–400)
RBC: 4.42 MIL/uL (ref 4.22–5.81)
RDW: 13.1 % (ref 11.5–15.5)
WBC: 14.7 10*3/uL — ABNORMAL HIGH (ref 4.0–10.5)
nRBC: 0 % (ref 0.0–0.2)

## 2023-08-14 LAB — BASIC METABOLIC PANEL WITH GFR
Anion gap: 8 (ref 5–15)
BUN: 26 mg/dL — ABNORMAL HIGH (ref 8–23)
CO2: 22 mmol/L (ref 22–32)
Calcium: 8.5 mg/dL — ABNORMAL LOW (ref 8.9–10.3)
Chloride: 105 mmol/L (ref 98–111)
Creatinine, Ser: 0.69 mg/dL (ref 0.61–1.24)
GFR, Estimated: >60 mL/min (ref >60)
Glucose, Bld: 118 mg/dL — ABNORMAL HIGH (ref 70–99)
Potassium: 3.8 mmol/L (ref 3.5–5.1)
Sodium: 135 mmol/L (ref 135–145)

## 2023-08-14 LAB — MAGNESIUM: Magnesium: 2 mg/dL (ref 1.7–2.4)

## 2023-08-14 LAB — ECHOCARDIOGRAM COMPLETE BUBBLE STUDY
Area-P 1/2: 3.12 cm2
S' Lateral: 2.4 cm

## 2023-08-14 MED ORDER — OXYCODONE HCL 5 MG PO TABS: 10.0000 mg | ORAL_TABLET | ORAL | Status: DC | PRN

## 2023-08-14 MED ORDER — OXYCODONE HCL 5 MG PO TABS: 5.0000 mg | ORAL_TABLET | ORAL | Status: DC | PRN

## 2023-08-14 MED ORDER — OXYCODONE HCL 5 MG PO TABS
10.0000 mg | ORAL_TABLET | ORAL | Status: DC | PRN
  Administered 2023-08-14 – 2023-08-17 (×10): 10 mg via ORAL
  Filled 2023-08-14 (×10): qty 2

## 2023-08-14 MED ORDER — POLYETHYLENE GLYCOL 3350 17 G PO PACK
17.0000 g | PACK | Freq: Every day | ORAL | Status: DC
  Administered 2023-08-16: 17 g via ORAL
  Filled 2023-08-14 (×3): qty 1

## 2023-08-14 MED ORDER — POLYETHYLENE GLYCOL 3350 17 G PO PACK
17.0000 g | PACK | Freq: Two times a day (BID) | ORAL | Status: AC
  Administered 2023-08-14: 17 g via ORAL
  Filled 2023-08-14 (×2): qty 1

## 2023-08-14 NOTE — Op Note (Signed)
 08/13/2023  8:26 AM  PATIENT:  Paul [name].    PRE-OPERATIVE DIAGNOSIS:  POST OP INFECTION LEFT KNEE  POST-OPERATIVE DIAGNOSIS:  Same  PROCEDURE:  INCISION AND DRAINAGE OF DEEP ABSCESS, KNEE  SURGEON:  Saundra Curl, MD  ASSISTANT: Marzella Solan, PA-C, he was present and scrubbed throughout the case, critical for completion in a timely fashion, and for retraction, instrumentation, and closure.   ANESTHESIA:   General  PREOPERATIVE INDICATIONS:  [name]. is a  78 y.o. male with a diagnosis of POST OP INFECTION LEFT KNEE who failed conservative measures and elected for surgical management.    The risks benefits and alternatives were discussed with the patient preoperatively including but not limited to the risks of infection, bleeding, nerve injury, cardiopulmonary complications, the need for revision surgery, among others, and the patient was willing to proceed.  OPERATIVE IMPLANTS: Vanco powder  OPERATIVE FINDINGS: Significant purulent collection central defect of quad leading to the knee joint remainder intact  BLOOD LOSS: Minimal  COMPLICATIONS: None  TOURNIQUET TIME: 45 minutes  OPERATIVE PROCEDURE:  Patient was identified in the preoperative holding area and site was marked by me He was transported to the operating theater and placed on the table in supine position taking care to pad all bony prominences. After a preincinduction time out anesthesia was induced. The left lower extremity was prepped and draped in normal sterile fashion and a pre-incision timeout was performed. He received Ancef  after culture for preoperative antibiotics.   I made incision through his previous incision purulent fluid was noted and evacuated.  I elevated his soft tissue flaps noted a central defect in the quad tendon of roughly 5 mm with communication to his knee joint.  Remainder of the quad tendon was intact.  It was well-healed  I made an infrapatellar portal into the knee  I then irrigated the knee joint with 6 L of saline  I irrigated his superficial wound it was clean no devitalized tissue at the remainder of the case  I placed a gram of vancomycin powder within the knee joint and a superficial wound  Monofilament stitch used to close the defect in his quad tendon and then a layered closure with nylon stitch  POST OPERATIVE PLAN: Weight-bear as tolerated knee immobilizer to allow recovery of soft tissues continue infectious disease directed antibiotics

## 2023-08-14 NOTE — Progress Notes (Signed)
     Results of in office L knee aspiration I did on 08/09/23   CULTURE, AEROBIC AND ANAEROBIC W/GRAM STAIN CULTURE  CULTURE, ANAEROBIC BACTERIA W/GRAM STAIN Micro Number: [id number]  Test Status: Preliminary  Specimen Source: Knee, left  Specimen Quality: Adequate  Gram Stain: No epithelial cells seen  Moderate White blood cells seen  Moderate Gram positive cocci in pairs  Few Gram positive cocci in clusters  Result: No anaerobes isolated to date, continuing incubation.   CULTURE, AEROBIC BACTERIA SEE NOTE A F CULTURE, AEROBIC BACTERIA  Micro Number: [id number]  Test Status: Final  Specimen Source: Knee, left  Specimen Quality: Adequate  Result: Moderate growth of Staphylococcus aureus  This isolate demonstrates inducible clindamycin resistance.  S.aureus   CIPROFLOXACIN  S <=0.5  CLINDAMYCIN R NR  ERYTHROMYCIN R >=8  GENTAMICIN S <=0.5  LEVOFLOXACIN  S <=0.12  MOXIFLOXACIN S <=0.25  OXACILLIN S 0.5 **1  TETRACYCLINE S <=1  TRIMETHOPRIM/SULFA S <=10  VANCOMYCIN S 1     Darren Caldron M Allona Gondek, PA-C Office [phone] 08/14/2023, 1:11 PM
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
## 2023-08-14 NOTE — Evaluation (Signed)
 Occupational Therapy Evaluation Patient Details Name: Paul [name]. MRN: [id number] DOB: [birth year] Today's Date: 08/14/2023   History of Present Illness   Pt is a 78 y.o. male presenting to [facility] hospital on 08/12/23 with post op infection of left knee. Pt is s/p incision and drainage of deep abscess of left knee on 08/13/23. PMH is significant for GERD, asthma, HLD, HTN, and anxiety.     Clinical Impressions Pt admitted based on above, and was seen based on problem list below. PTA pt was living with his wife and was independent with ADLs.Today pt is requiring set up  to CGA for ADLs. Bed mobility and functional transfers are  CGA. Pt demoing good use of compensatory strategies learned from OT sessions, from prior surgeries. Pt with adequate family support available at home upon d/c. Anticipate pt will progress well No follow up OT or DME needs. OT will continue to follow acutely to maximize functional independence.        If plan is discharge home, recommend the following:   A little help with walking and/or transfers;A little help with bathing/dressing/bathroom     Functional Status Assessment   Patient has had a recent decline in their functional status and demonstrates the ability to make significant improvements in function in a reasonable and predictable amount of time.     Equipment Recommendations   None recommended by OT      Precautions/Restrictions   Precautions Precautions: Fall Recall of Precautions/Restrictions: Intact Required Braces or Orthoses: Knee Immobilizer - Left Restrictions Weight Bearing Restrictions Per Provider Order: Yes LLE Weight Bearing Per Provider Order: Non weight bearing     Mobility Bed Mobility Overal bed mobility: Needs Assistance Bed Mobility: Supine to Sit     Supine to sit: Supervision, HOB elevated     General bed mobility comments: supervision for maintaining NWB    Transfers Overall transfer level: Needs  assistance Equipment used: Rolling walker (2 wheels) Transfers: Sit to/from Stand, Bed to chair/wheelchair/BSC Sit to Stand: Contact guard assist     Step pivot transfers: Contact guard assist     General transfer comment: CGA for balance and safety with RW      Balance Overall balance assessment: Needs assistance Sitting-balance support: No upper extremity supported, Feet supported Sitting balance-Leahy Scale: Good     Standing balance support: Bilateral upper extremity supported, During functional activity, Reliant on assistive device for balance Standing balance-Leahy Scale: Fair Standing balance comment: Reliant on RW       ADL either performed or assessed with clinical judgement   ADL Overall ADL's : Needs assistance/impaired Eating/Feeding: Set up;Sitting   Grooming: Set up;Sitting           Upper Body Dressing : Set up;Sitting   Lower Body Dressing: Supervision/safety;Bed level Lower Body Dressing Details (indicate cue type and reason): Pt using compensatory strategy to thread legs and bridge hips from bed level, s for safety with maintaining NWB status Toilet Transfer: Contact guard assist;Rolling walker (2 wheels);Stand-pivot Statistician Details (indicate cue type and reason): CGA for balance, simulated in room Toileting- Clothing Manipulation and Hygiene: Contact guard assist;Sit to/from stand Toileting - Clothing Manipulation Details (indicate cue type and reason): CGA for balance     Functional mobility during ADLs: Contact guard assist;Rolling walker (2 wheels) General ADL Comments: Pt with recent d/c from [facility] learned multiple compensatory strategies for ADLs     Vision Baseline Vision/History: 0 No visual deficits Vision Assessment?: No apparent visual deficits  Pertinent Vitals/Pain Pain Assessment Pain Assessment: Faces Faces Pain Scale: Hurts little more Pain Location: LLE Pain Descriptors / Indicators: Discomfort Pain  Intervention(s): Monitored during session, Repositioned     Extremity/Trunk Assessment Upper Extremity Assessment Upper Extremity Assessment: Overall WFL for tasks assessed   Lower Extremity Assessment Lower Extremity Assessment: Defer to PT evaluation   Cervical / Trunk Assessment Cervical / Trunk Assessment: Normal   Communication Communication Communication: No apparent difficulties   Cognition Arousal: Alert Behavior During Therapy: WFL for tasks assessed/performed Cognition: No apparent impairments       Following commands: Intact       Cueing  General Comments   Cueing Techniques: Verbal cues  VSS on RA           Home Living Family/patient expects to be discharged to:: Private residence Living Arrangements: Spouse/significant other Available Help at Discharge: Family;Available 24 hours/day Type of Home: House Home Access: Ramped entrance     Home Layout: Two level;Able to live on main level with bedroom/bathroom     Bathroom Shower/Tub: Producer, television/film/video: Standard Bathroom Accessibility: Yes How Accessible: Accessible via walker;Accessible via wheelchair Home Equipment: Shower seat;Hand held Programmer, systems (2 wheels)   Additional Comments: Pt reports recent short stay at [facility]      Prior Functioning/Environment Prior Level of Function : Independent/Modified Independent     Mobility Comments: Use of RW ADLs Comments: Pt reports mod I with ADLs    OT Problem List: Decreased strength;Decreased range of motion;Decreased activity tolerance;Impaired balance (sitting and/or standing);Decreased safety awareness   OT Treatment/Interventions: Self-care/ADL training;Therapeutic exercise;DME and/or AE instruction;Therapeutic activities;Patient/family education;Balance training      OT Goals(Current goals can be found in the care plan section)   Acute Rehab OT Goals Patient Stated Goal: To go home OT Goal Formulation: With  patient Time For Goal Achievement: 08/28/23 Potential to Achieve Goals: Good   OT Frequency:  Min 2X/week       AM-PAC OT "6 Clicks" Daily Activity     Outcome Measure Help from another person eating meals?: None Help from another person taking care of personal grooming?: A Little Help from another person toileting, which includes using toliet, bedpan, or urinal?: A Little Help from another person bathing (including washing, rinsing, drying)?: A Little Help from another person to put on and taking off regular upper body clothing?: A Little Help from another person to put on and taking off regular lower body clothing?: A Little 6 Click Score: 19   End of Session Equipment Utilized During Treatment: Gait belt;Rolling walker (2 wheels);Left knee immobilizer Nurse Communication: Mobility status  Activity Tolerance: Patient tolerated treatment well Patient left: in chair;with call bell/phone within reach;with nursing/sitter in room  OT Visit Diagnosis: Unsteadiness on feet (R26.81);Other abnormalities of gait and mobility (R26.89);Muscle weakness (generalized) (M62.81)                Time: 1610-9604 OT Time Calculation (min): 16 min Charges:  OT General Charges $OT Visit: 1 Visit OT Evaluation $OT Eval Low Complexity: 1 Low  Delmer Ferraris, OT  Acute Rehabilitation Services Office [phone] Secure chat preferred   Mickael Alamo 08/14/2023, 8:10 AM

## 2023-08-14 NOTE — Progress Notes (Signed)
 [facility]    Date of Admission:  08/12/2023   Total days of antibiotics 3   ID: Jesse Moritz. is a 78 y.o. male with  MSSA left knee septic arthritis and deep abscess with secondary bacteremia Principal Problem:   Septic arthritis (HCC) Active Problems:   Dyslipidemia   Depression   HEPATITIS C CARRIER   GERD (gastroesophageal reflux disease)   Cough   COPD, mild (HCC)   Gram positive bacterial infection   History of atrial fibrillation without current medication   MSSA bacteremia    Subjective: Afebrile. Not having significant left knee pain from surgery. Has questions about TEE.  Medications:   buPROPion   150 mg Oral Daily   docusate sodium   100 mg Oral BID   enoxaparin  (LOVENOX ) injection  40 mg Subcutaneous Q24H   finasteride   5 mg Oral Daily   fluticasone  furoate-vilanterol  1 puff Inhalation Daily   guaiFENesin  600 mg Oral BID   metoprolol  tartrate  25 mg Oral BID   mupirocin ointment  1 Application Nasal BID   pantoprazole   40 mg Oral Daily   polyethylene glycol  17 g Oral BID   [START ON 08/15/2023] polyethylene glycol  17 g Oral Daily   senna-docusate  2 tablet Oral QHS    Objective: Vital signs in last 24 hours: Temp:  [97.6 F (36.4 C)-98.6 F (37 C)] 97.7 F (36.5 C) (06/04 1446) Pulse Rate:  [61-70] 65 (06/04 1446) Resp:  [16-18] 16 (06/04 1446) BP: (105-129)/(61-85) 129/75 (06/04 1446) SpO2:  [93 %-99 %] 93 % (06/04 1446)  Physical Exam  Constitutional: He is oriented to person, place, and time. He appears well-developed and well-nourished. No distress.  HENT:  Mouth/Throat: Oropharynx is clear and moist. No oropharyngeal exudate.  Cardiovascular: Normal rate, regular rhythm and normal heart sounds. Exam reveals no gallop and no friction rub.  No murmur heard.  Pulmonary/Chest: Effort normal and breath sounds normal. No respiratory distress. He has no wheezes.  XBM:WUXL knee is wrapped Neurological: He is  alert and oriented to person, place, and time.  Skin: Skin is warm and dry. No rash noted. No erythema.  Psychiatric: He has a normal mood and affect. His behavior is normal.    Lab Results Recent Labs    08/13/23 1236 08/14/23 0610  WBC 14.2* 14.7*  HGB 14.6 13.7  HCT 44.4 41.4  NA 138 135  K 4.3 3.8  CL 105 105  CO2 24 22  BUN 29* 26*  CREATININE 0.88 0.69   Liver Panel No results for input(s): "PROT", "ALBUMIN", "AST", "ALT", "ALKPHOS", "BILITOT", "BILIDIR", "IBILI" in the last 72 hours. Sedimentation Rate Recent Labs    08/12/23 1811  ESRSEDRATE 39*   C-Reactive Protein Recent Labs    08/12/23 1811  CRP 4.4*    Microbiology: Blood cx 6/2 MSSA Blood cx 6/3 no growth to date Studies/Results: ECHOCARDIOGRAM COMPLETE BUBBLE STUDY Result Date: 08/14/2023    ECHOCARDIOGRAM REPORT   Patient Name:   Paul [name]. Date of Exam: 08/14/2023 Medical Rec #:  [id number]            Height:       72.0 in Accession #:    [id number]           Weight:       178.0 lb Date of Birth:  Jul 10, 1945            BSA:  2.028 m Patient Age:    78 years             BP:           115/85 mmHg Patient Gender: M                    HR:           59 bpm. Exam Location:  Inpatient Procedure: 2D Echo, Cardiac Doppler and Color Doppler (Both Spectral and Color            Flow Doppler were utilized during procedure). Indications:    MSSA bacteremia  History:        Patient has prior history of Echocardiogram examinations, most                 recent 05/03/2023. Signs/Symptoms:Syncope; Risk                 Factors:Hypertension.  Sonographer:    Janette Medley Referring Phys: [id number] [facility] M GAWNE IMPRESSIONS  1. Left ventricular ejection fraction, by estimation, is 60 to 65%. The left ventricle has normal function. The left ventricle has no regional wall motion abnormalities. There is mild concentric left ventricular hypertrophy. Left ventricular diastolic parameters were normal.  2. Right ventricular  systolic function is normal. The right ventricular size is normal.  3. The mitral valve is normal in structure. Mild mitral valve regurgitation. No evidence of mitral stenosis.  4. The aortic valve is tricuspid. There is mild thickening of the aortic valve. Aortic valve regurgitation is not visualized. Aortic valve sclerosis/calcification is present, without any evidence of aortic stenosis.  5. The inferior vena cava is normal in size with greater than 50% respiratory variability, suggesting right atrial pressure of 3 mmHg.  6. Agitated saline contrast bubble study was negative, with no evidence of any interatrial shunt. Conclusion(s)/Recommendation(s): No evidence of valvular vegetations on this transthoracic echocardiogram. Consider a transesophageal echocardiogram to exclude infective endocarditis if clinically indicated. FINDINGS  Left Ventricle: Left ventricular ejection fraction, by estimation, is 60 to 65%. The left ventricle has normal function. The left ventricle has no regional wall motion abnormalities. The left ventricular internal cavity size was normal in size. There is  mild concentric left ventricular hypertrophy. Left ventricular diastolic parameters were normal. Right Ventricle: The right ventricular size is normal. No increase in right ventricular wall thickness. Right ventricular systolic function is normal. Left Atrium: Left atrial size was normal in size. Right Atrium: Right atrial size was normal in size. Pericardium: There is no evidence of pericardial effusion. Mitral Valve: The mitral valve is normal in structure. Mild mitral valve regurgitation. No evidence of mitral valve stenosis. Tricuspid Valve: The tricuspid valve is normal in structure. Tricuspid valve regurgitation is not demonstrated. No evidence of tricuspid stenosis. Aortic Valve: The aortic valve is tricuspid. There is mild thickening of the aortic valve. Aortic valve regurgitation is not visualized. Aortic valve  sclerosis/calcification is present, without any evidence of aortic stenosis. Pulmonic Valve: The pulmonic valve was normal in structure. Pulmonic valve regurgitation is mild. No evidence of pulmonic stenosis. Aorta: The aortic root is normal in size and structure. Venous: The inferior vena cava is normal in size with greater than 50% respiratory variability, suggesting right atrial pressure of 3 mmHg. IAS/Shunts: No atrial level shunt detected by color flow Doppler. Agitated saline contrast bubble study was negative, with no evidence of any interatrial shunt.  LEFT VENTRICLE PLAX 2D LVIDd:  3.60 cm   Diastology LVIDs:         2.40 cm   LV e' medial:    10.00 cm/s LV PW:         1.50 cm   LV E/e' medial:  9.3 LV IVS:        1.50 cm   LV e' lateral:   10.80 cm/s LVOT diam:     2.00 cm   LV E/e' lateral: 8.6 LV SV:         83 LV SV Index:   41 LVOT Area:     3.14 cm  RIGHT VENTRICLE             IVC RV S prime:     16.00 cm/s  IVC diam: 2.00 cm TAPSE (M-mode): 2.7 cm LEFT ATRIUM           Index        RIGHT ATRIUM           Index LA diam:      3.70 cm 1.82 cm/m   RA Area:     13.30 cm LA Vol (A2C): 34.9 ml 17.21 ml/m  RA Volume:   30.10 ml  14.85 ml/m LA Vol (A4C): 26.8 ml 13.22 ml/m  AORTIC VALVE LVOT Vmax:   112.00 cm/s LVOT Vmean:  81.200 cm/s LVOT VTI:    0.265 m  AORTA Ao Root diam: 3.60 cm Ao Asc diam:  3.70 cm MITRAL VALVE MV Area (PHT): 3.12 cm    SHUNTS MV Decel Time: 243 msec    Systemic VTI:  0.26 m MV E velocity: 93.30 cm/s  Systemic Diam: 2.00 cm MV A velocity: 79.70 cm/s MV E/A ratio:  1.17 Jules Oar MD Electronically signed by Jules Oar MD Signature Date/Time: 08/14/2023/3:20:01 PM    Final    Portable chest 1 View Result Date: 08/12/2023 CLINICAL DATA:  Cough.  Preop exam. EXAM: PORTABLE CHEST 1 VIEW COMPARISON:  Radiograph 12/11/2021, CT 12/22/2021 FINDINGS: The cardiomediastinal contours are normal. Scarring on prior CT is not well seen by radiograph. Pulmonary vasculature  is normal. No consolidation, pleural effusion, or pneumothorax. No acute osseous abnormalities are seen. IMPRESSION: No active disease. Electronically Signed   By: Chadwick Colonel M.D.   On: 08/12/2023 20:20   DG Knee Left Port Result Date: 08/12/2023 CLINICAL DATA:  Left knee pain. EXAM: PORTABLE LEFT KNEE - 1-2 VIEW COMPARISON:  06/27/2023, MRI 04/29/2023 FINDINGS: Decreased patellar [place] from prior exam. Normal tibiofemoral alignment. 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 Moderate joint effusion, although diminished from prior. Anterior soft tissue edema persists. No evidence of acute fracture or erosive change. IMPRESSION: 1. Decreased patellar [place] from prior exam. 2. 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 3. Moderate joint effusion, although diminished from prior. Electronically Signed   By: Chadwick Colonel M.D.   On: 08/12/2023 20:19     Assessment/Plan: Mssa septic arthritis with secondary bacteremia = likely to treat for 6 wk. If blood cx NGTD at 48hr, can place picc line. Anticipate to get TEE tomorrow to rule out endocarditis  Long term medication management- cr is stable  Leukocytosis = still not trending down quickly but could be elevated from surgery from yesterday. Pan to check cbc tomorrow  I have personally spent 30 minutes involved in face-to-face and non-face-to-face activities for this patient on the day of the visit. Professional time  spent includes the following activities: Preparing to see the patient (review of tests),  Performing a medically appropriate examination and/or evaluation , Ordering medications/tests/procedures, referring and communicating with other health care professionals, Documenting clinical information in the EMR, Independently interpreting results (not separately reported), Communicating  results to the patient.      [facility] Pager: [phone]  08/14/2023, 5:33 PM

## 2023-08-14 NOTE — Plan of Care (Signed)
  Problem: Education: Goal: Knowledge of General Education information will improve Description: Including pain rating scale, medication(s)/side effects and non-pharmacologic comfort measures Outcome: Progressing   Problem: Safety: Goal: Ability to remain free from injury will improve Outcome: Progressing   Problem: Pain Managment: Goal: General experience of comfort will improve and/or be controlled Outcome: Progressing   Problem: Skin Integrity: Goal: Risk for impaired skin integrity will decrease Outcome: Progressing

## 2023-08-14 NOTE — Progress Notes (Signed)
    Subjective: Patient reports pain as marked.  Tolerating diet.  Urinating.   No CP, SOB.  Has tried to mobilize some OOB with PT/OT.  Objective:   VITALS:   Vitals:   08/13/23 2339 08/14/23 0459 08/14/23 0751 08/14/23 0856  BP: 117/62 105/61 115/85   Pulse: 67 61 68 68  Resp: 18 18 16 16   Temp: 98.1 F (36.7 C) 97.6 F (36.4 C) 98.6 F (37 C)   TempSrc:      SpO2: 97% 99% 96%   Weight:      Height:          Latest Ref Rng & Units 08/14/2023    6:10 AM 08/13/2023   12:36 PM 08/12/2023    6:11 PM  CBC  WBC 4.0 - 10.5 K/uL 14.7  14.2  14.4   Hemoglobin 13.0 - 17.0 g/dL 65.7  84.6  96.2   Hematocrit 39.0 - 52.0 % 41.4  44.4  43.6   Platelets 150 - 400 K/uL 295  306  323       Latest Ref Rng & Units 08/14/2023    6:10 AM 08/13/2023   12:36 PM 08/12/2023    6:11 PM  BMP  Glucose 70 - 99 mg/dL 952  841  324   BUN 8 - 23 mg/dL 26  29  33   Creatinine 0.61 - 1.24 mg/dL 4.01  0.27  2.53   Sodium 135 - 145 mmol/L 135  138  135   Potassium 3.5 - 5.1 mmol/L 3.8  4.3  4.0   Chloride 98 - 111 mmol/L 105  105  99   CO2 22 - 32 mmol/L 22  24  24    Calcium  8.9 - 10.3 mg/dL 8.5  9.1  9.5    Intake/Output      06/03 0701 06/04 0700 06/04 0701 06/05 0700   P.O. 240    I.V. (mL/kg) 800 (9.9)    IV Piggyback 450    Total Intake(mL/kg) 1490 (18.5)    Urine (mL/kg/hr) 1100 (0.6)    Blood 15    Total Output 1115    Net +375            Physical Exam: General: NAD.   Resp: No increased wob Cardio: regular rate and rhythm ABD soft Neurologically intact MSK Neurovascularly intact Sensation intact distally Intact pulses distally Dorsiflexion/Plantar flexion intact Incision: dressing C/D/I KI in place Able to perform SLR  Assessment: 1 Day Post-Op  S/P Procedure(s) (LRB): INCISION AND DRAINAGE OF DEEP ABSCESS, KNEE (Left) by Dr. Deloras Fess. Abigail Abler on 08/13/23  Principal Problem:   Septic arthritis (HCC) Active Problems:   Dyslipidemia   Depression   HEPATITIS C CARRIER    GERD (gastroesophageal reflux disease)   Cough   COPD, mild (HCC)   Gram positive bacterial infection   History of atrial fibrillation without current medication   MSSA bacteremia   Plan: Monitor specimen results   Advance diet Up with therapy Incentive Spirometry Elevate and Apply ice  Weightbearing: WBAT LLE Insicional and dressing care: Reinforce dressings as needed Orthopedic device(s): KI Showering: Keep dressing dry VTE prophylaxis: Lovenox  40mg  qd while inpatient, SCDs, ambulation Pain control: PRN Follow - up plan: TBD Contact information:  Randal Bury MD, Marzella Solan PA-C  Dispo: TBD. May require another I&D Friday.      Lore Rode, PA-C Office [phone] 08/14/2023, 11:14 AM

## 2023-08-14 NOTE — Progress Notes (Signed)
   [facility] has been requested to perform a transesophageal echocardiogram on [employer]. for bacteremia and septic knee.  After careful review of history and examination, the risks and benefits of transesophageal echocardiogram have been explained including risks of esophageal damage, perforation (1:10,000 risk), bleeding, pharyngeal hematoma as well as other potential complications associated with anesthesia including aspiration, arrhythmia, respiratory failure and death. Alternatives to treatment were discussed, questions were answered. Patient is willing to proceed.   Patient with PMH of HTN, HLD, and post op afib presented with septic knee and bacteremia. Vital sign stable, no significant anemia or thrombocytopenia. No contraindication to TEE.   Sidnee Gambrill, [place] 08/14/2023 10:49 AM

## 2023-08-14 NOTE — Progress Notes (Addendum)
 PT Cancellation Note  Patient Details Name: Paul [name]. MRN: [id number] DOB: Jan 16, 1946   Cancelled Treatment:    Reason Eval/Treat Not Completed: Other (comment) Pt with conflicting LLE wt bearing status as in order set there is order from 6/3 for NWB LLE in knee immobilizer and in Dr. Erma Hay note from 6/4 he states " Weight-bear as tolerated knee immobilizer". I called MD office to attempt to get clarification. If I do not hear back I can return and evaluate pt at NWB level while we continue to await clarification.   Addendum at 1:58 PM: we have received clarification from ortho that pt is WBAT in [place] on LLE. However, upon my return, pt reports he has a conference call at 2:00PM and asks for PT to return later in day. Will return as time/schedule allows.   Barnabas Booth, PT, DPT   Acute Rehabilitation Department Office [phone] Secure Chat Communication Preferred  Barnabas Booth, [place], DPT   Acute Rehabilitation Department Office [phone] Secure Chat Communication Preferred   Lona Rist 08/14/2023, 12:29 PM

## 2023-08-14 NOTE — Progress Notes (Signed)
 [facility] PROGRESS NOTE    Progress Note  Paul [name].  ZOX:[id number] DOB: 1946-03-09 DOA: 08/12/2023 PCP: Genia Kettering, MD     Brief Narrative:   Paul [name]. is an 78 y.o. male past medical history of essential hypertension, hyperlipidemia, seen on February 2025 for bilateral quadricep tendon rupture underwent repair on 04/30/2023 and discharged to inpatient rehab on 05/07/2023, in April 2025 seen by orthopedic surgery underwent revision surgery as an outpatient.  He sustained another injury on 08/08/2023 to the left knee had significant swelling aspiration was performed with improvement in the pain was started on prednisone  comes back on 08/22/2023 draining pus from the left knee area, blood cultures grew MSSA underwent I&D on 08/13/2023 for septic left knee arthritis    Assessment/Plan:   Left knee septic arthritis/MSSA bacteremia: Washout by orthopedic surgery on 08/13/2023. Blood cultures grew MSSA infectious disease was consulted recommended to continue IV cefazolin . Infectious disease recommended a TEE to rule out endocarditis. Blood cultures have been sent on 08/14/2023 and clearance of bacteremia before proceeding with PICC line.  Mild COPD: No evidence of exacerbation continue inhalers.  History of chronic atrial fibrillation does not take any medication: Currently in sinus rhythm. He relates his only takes metoprolol  currently off Eliquis .  Essential hypertension: Continue metoprolol  hold all other antihypertensive medication.  Dyslipidemia: Continue statins.  Depression: Continue Wellbutrin .  Hepatitis C carrier: Noted.  GERD: Continue PPI.  DVT prophylaxis: lovenox  Family Communication:none Status is: Inpatient Remains inpatient appropriate because: Septic knee and MSSA bacteremia    Code Status:     Code Status Orders  (From admission, onward)           Start     Ordered   08/12/23 1656  Full code  Continuous        Question:  By:  Answer:  Consent: discussion documented in EHR   08/12/23 1656           Code Status History     Date Active Date Inactive Code Status Order ID Comments User Context   05/03/2023 1615 05/09/2023 1739 Full Code [id number]  Geradine Kitten Inpatient   05/03/2023 1615 05/03/2023 1615 Full Code [id number]  Geradine Kitten Inpatient   04/28/2023 2243 05/03/2023 1613 Full Code [id number]  Selene Dais, MD ED         IV Access:   Peripheral IV   Procedures and diagnostic studies:   Portable chest 1 View Result Date: 08/12/2023 CLINICAL DATA:  Cough.  Preop exam. EXAM: PORTABLE CHEST 1 VIEW COMPARISON:  Radiograph 12/11/2021, CT 12/22/2021 FINDINGS: The cardiomediastinal contours are normal. Scarring on prior CT is not well seen by radiograph. Pulmonary vasculature is normal. No consolidation, pleural effusion, or pneumothorax. No acute osseous abnormalities are seen. IMPRESSION: No active disease. Electronically Signed   By: Chadwick Colonel M.D.   On: 08/12/2023 20:20   DG Knee Left Port Result Date: 08/12/2023 CLINICAL DATA:  Left knee pain. EXAM: PORTABLE LEFT KNEE - 1-2 VIEW COMPARISON:  06/27/2023, MRI 04/29/2023 FINDINGS: Decreased patellar Baja from prior exam. Normal tibiofemoral alignment. 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 Moderate joint effusion, although diminished from prior. Anterior soft tissue edema persists. No evidence of acute fracture or erosive change. IMPRESSION: 1. Decreased patellar Baja from prior exam. 2. 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 3. Moderate joint effusion, although  diminished from prior. Electronically Signed   By: Chadwick Colonel M.D.   On: 08/12/2023 20:19     Medical Consultants:   None.   Subjective:    Paul [name]. Pain is  not controlled, last bowel movement was 3 days ago  Objective:    Vitals:   08/13/23 2039 08/13/23 2339 08/14/23 0459 08/14/23 0751  BP: 113/72 117/62 105/61 115/85  Pulse: 70 67 61 68  Resp: 18 18 18 16   Temp: 97.9 F (36.6 C) 98.1 F (36.7 C) 97.6 F (36.4 C) 98.6 F (37 C)  TempSrc:      SpO2: 97% 97% 99% 96%  Weight:      Height:       SpO2: 96 % O2 Flow Rate (L/min): 5 L/min   Intake/Output Summary (Last 24 hours) at 08/14/2023 0848 Last data filed at 08/14/2023 0323 Gross per 24 hour  Intake 1490 ml  Output 1115 ml  Net 375 ml   Filed Weights   08/12/23 1600 08/13/23 1341  Weight: 81 kg 80.7 kg    Exam: General exam: In no acute distress. Respiratory system: Good air movement and clear to auscultation. Cardiovascular system: S1 & S2 heard, RRR. No JVD. Gastrointestinal system: Abdomen is nondistended, soft and nontender.  Extremities: Right knee is wrapped Skin: No rashes, lesions or ulcers Psychiatry: Judgement and insight appear normal. Mood & affect appropriate.    Data Reviewed:    Labs: Basic Metabolic Panel: Recent Labs  Lab 08/12/23 1811 08/13/23 1236 08/14/23 0610  NA 135 138 135  K 4.0 4.3 3.8  CL 99 105 105  CO2 24 24 22   GLUCOSE 126* 111* 118*  BUN 33* 29* 26*  CREATININE 0.98 0.88 0.69  CALCIUM  9.5 9.1 8.5*  MG  --  2.0 2.0   GFR Estimated Creatinine Clearance: 82.2 mL/min (by C-G formula based on SCr of 0.69 mg/dL). Liver Function Tests: No results for input(s): "AST", "ALT", "ALKPHOS", "BILITOT", "PROT", "ALBUMIN" in the last 168 hours. No results for input(s): "LIPASE", "AMYLASE" in the last 168 hours. No results for input(s): "AMMONIA" in the last 168 hours. Coagulation profile Recent Labs  Lab 08/12/23 1811  INR 1.0   COVID-19 Labs  Recent Labs    08/12/23 1811  CRP 4.4*    Lab Results  Component Value Date   SARSCOV2NAA NEGATIVE 02/06/2019    CBC: Recent Labs  Lab 08/12/23 1811 08/13/23 1236  08/14/23 0610  WBC 14.4* 14.2* 14.7*  NEUTROABS 12.7*  --  11.9*  HGB 14.7 14.6 13.7  HCT 43.6 44.4 41.4  MCV 93.0 93.5 93.7  PLT 323 306 295   Cardiac Enzymes: No results for input(s): "CKTOTAL", "CKMB", "CKMBINDEX", "TROPONINI" in the last 168 hours. BNP (last 3 results) Recent Labs    05/28/23 1513  PROBNP 90   CBG: No results for input(s): "GLUCAP" in the last 168 hours. D-Dimer: No results for input(s): "DDIMER" in the last 72 hours. Hgb A1c: No results for input(s): "HGBA1C" in the last 72 hours. Lipid Profile: No results for input(s): "CHOL", "HDL", "LDLCALC", "TRIG", "CHOLHDL", "LDLDIRECT" in the last 72 hours. Thyroid  function studies: No results for input(s): "TSH", "T4TOTAL", "T3FREE", "THYROIDAB" in the last 72 hours.  Invalid input(s): "FREET3" Anemia work up: No results for input(s): "VITAMINB12", "FOLATE", "FERRITIN", "TIBC", "IRON", "RETICCTPCT" in the last 72 hours. Sepsis Labs: Recent Labs  Lab 08/12/23 1811 08/13/23 1236 08/14/23 0610  WBC 14.4* 14.2* 14.7*   Microbiology Recent Results (from the past  240 hours)  Surgical PCR screen     Status: Abnormal   Collection Time: 08/12/23  4:25 PM   Specimen: Nasal Mucosa; Nasal Swab  Result Value Ref Range Status   MRSA, PCR NEGATIVE NEGATIVE Final   Staphylococcus aureus POSITIVE (A) NEGATIVE Final    Comment: (NOTE) The Xpert SA Assay (FDA approved for NASAL specimens in patients 90 years of age and older), is one component of a comprehensive surveillance program. It is not intended to diagnose infection nor to guide or monitor treatment. Performed at [facility], [address] [place], [place] [address]   Aerobic Culture w Gram Stain (superficial specimen)     Status: None (Preliminary result)   Collection Time: 08/12/23  4:58 PM   Specimen: KNEE  Result Value Ref Range Status   Specimen Description KNEE  Final   Special Requests NONE  Final   Gram Stain   Final    FEW WBC PRESENT,  PREDOMINANTLY PMN RARE GRAM POSITIVE COCCI IN PAIRS    Culture   Final    FEW STAPHYLOCOCCUS AUREUS SUSCEPTIBILITIES TO FOLLOW Performed at [facility], [address] [place], [place] [address]    Report Status PENDING  Incomplete  Culture, blood (Routine X 2) w Reflex to ID Panel     Status: None (Preliminary result)   Collection Time: 08/12/23  6:07 PM   Specimen: BLOOD  Result Value Ref Range Status   Specimen Description BLOOD SITE NOT SPECIFIED  Final   Special Requests   Final    BOTTLES DRAWN AEROBIC AND ANAEROBIC Blood Culture results may not be optimal due to an inadequate volume of blood received in culture bottles   Culture  Setup Time   Final    GRAM POSITIVE COCCI IN CLUSTERS IN BOTH AEROBIC AND ANAEROBIC BOTTLES CRITICAL VALUE NOTED.  VALUE IS CONSISTENT WITH PREVIOUSLY REPORTED AND CALLED VALUE. Performed at [facility], [address], [place], [place] [address]    Culture GRAM POSITIVE COCCI  Final   Report Status PENDING  Incomplete  Culture, blood (Routine X 2) w Reflex to ID Panel     Status: None (Preliminary result)   Collection Time: 08/12/23  6:16 PM   Specimen: BLOOD  Result Value Ref Range Status   Specimen Description BLOOD SITE NOT SPECIFIED  Final   Special Requests   Final    BOTTLES DRAWN AEROBIC AND ANAEROBIC Blood Culture results may not be optimal due to an inadequate volume of blood received in culture bottles   Culture  Setup Time   Final    GRAM POSITIVE COCCI IN CLUSTERS IN BOTH AEROBIC AND ANAEROBIC BOTTLES CRITICAL RESULT CALLED TO, READ BACK BY AND VERIFIED WITH: PHARMD ELIZABETH MARTIN ON 08/13/23 @ 1232 BY DRT Performed at [facility], [address], [place], [place] [address]    Culture GRAM POSITIVE COCCI  Final   Report Status PENDING  Incomplete  Blood Culture ID Panel (Reflexed)     Status: Abnormal   Collection Time: 08/12/23  6:16 PM  Result Value Ref Range Status   Enterococcus faecalis NOT DETECTED NOT  DETECTED Final   Enterococcus Faecium NOT DETECTED NOT DETECTED Final   Listeria monocytogenes NOT DETECTED NOT DETECTED Final   Staphylococcus species DETECTED (A) NOT DETECTED Final    Comment: CRITICAL RESULT CALLED TO, READ BACK BY AND VERIFIED WITH: PHARMD ELIZABETH MARTIN ON 08/13/23 @ 1232 BY DRT    Staphylococcus aureus (BCID)  DETECTED (A) NOT DETECTED Final    Comment: CRITICAL RESULT CALLED TO, READ BACK BY AND VERIFIED WITH: PHARMD ELIZABETH MARTIN ON 08/13/23 @ 1232 BY DRT    Staphylococcus epidermidis NOT DETECTED NOT DETECTED Final   Staphylococcus lugdunensis NOT DETECTED NOT DETECTED Final   Streptococcus species NOT DETECTED NOT DETECTED Final   Streptococcus agalactiae NOT DETECTED NOT DETECTED Final   Streptococcus pneumoniae NOT DETECTED NOT DETECTED Final   Streptococcus pyogenes NOT DETECTED NOT DETECTED Final   A.calcoaceticus-baumannii NOT DETECTED NOT DETECTED Final   Bacteroides fragilis NOT DETECTED NOT DETECTED Final   Enterobacterales NOT DETECTED NOT DETECTED Final   Enterobacter cloacae complex NOT DETECTED NOT DETECTED Final   Escherichia coli NOT DETECTED NOT DETECTED Final   Klebsiella aerogenes NOT DETECTED NOT DETECTED Final   Klebsiella oxytoca NOT DETECTED NOT DETECTED Final   Klebsiella pneumoniae NOT DETECTED NOT DETECTED Final   Proteus species NOT DETECTED NOT DETECTED Final   Salmonella species NOT DETECTED NOT DETECTED Final   Serratia marcescens NOT DETECTED NOT DETECTED Final   Haemophilus influenzae NOT DETECTED NOT DETECTED Final   Neisseria meningitidis NOT DETECTED NOT DETECTED Final   Pseudomonas aeruginosa NOT DETECTED NOT DETECTED Final   Stenotrophomonas maltophilia NOT DETECTED NOT DETECTED Final   Candida albicans NOT DETECTED NOT DETECTED Final   Candida auris NOT DETECTED NOT DETECTED Final   Candida glabrata NOT DETECTED NOT DETECTED Final   Candida krusei NOT DETECTED NOT DETECTED Final   Candida parapsilosis NOT DETECTED NOT  DETECTED Final   Candida tropicalis NOT DETECTED NOT DETECTED Final   Cryptococcus neoformans/gattii NOT DETECTED NOT DETECTED Final   Meth resistant mecA/C and MREJ NOT DETECTED NOT DETECTED Final    Comment: Performed at [facility], [address] [place] [address], [place], [place] [address]  Aerobic/Anaerobic Culture w Gram Stain (surgical/deep wound)     Status: None (Preliminary result)   Collection Time: 08/13/23  3:42 PM   Specimen: Abscess  Result Value Ref Range Status   Specimen Description ABSCESS LEFT KNEE  Final   Special Requests NONE  Final   Gram Stain   Final    ABUNDANT WBC PRESENT, PREDOMINANTLY PMN RARE GRAM POSITIVE COCCI IN PAIRS    Culture   Final    TOO YOUNG TO READ Performed at [facility], [address], [place], [place] [address]    Report Status PENDING  Incomplete     Medications:    acetaminophen   1,000 mg Oral Q6H   buPROPion   150 mg Oral Daily   docusate sodium   100 mg Oral BID   enoxaparin  (LOVENOX ) injection  40 mg Subcutaneous Q24H   finasteride   5 mg Oral Daily   fluticasone  furoate-vilanterol  1 puff Inhalation Daily   guaiFENesin  600 mg Oral BID   metoprolol  tartrate  25 mg Oral BID   mupirocin ointment  1 Application Nasal BID   pantoprazole   40 mg Oral Daily   polyethylene glycol  17 g Oral BID   senna-docusate  2 tablet Oral QHS   Continuous Infusions:   ceFAZolin  (ANCEF ) IV 2 g (08/14/23 0232)      LOS: 2 days   Macdonald Savoy  [facility]  08/14/2023, 8:48 AM

## 2023-08-15 ENCOUNTER — Inpatient Hospital Stay (HOSPITAL_COMMUNITY)

## 2023-08-15 ENCOUNTER — Other Ambulatory Visit (HOSPITAL_COMMUNITY): Payer: Self-pay

## 2023-08-15 ENCOUNTER — Encounter (HOSPITAL_COMMUNITY): Payer: Self-pay | Admitting: Cardiology

## 2023-08-15 ENCOUNTER — Encounter (HOSPITAL_COMMUNITY)
Admission: RE | Disposition: A | Discharge status: Home Health | Payer: Self-pay | Source: Ambulatory Visit | Attending: Internal Medicine

## 2023-08-15 DIAGNOSIS — M00062 Staphylococcal arthritis, left knee: Secondary | ICD-10-CM

## 2023-08-15 DIAGNOSIS — I340 Nonrheumatic mitral (valve) insufficiency: Secondary | ICD-10-CM

## 2023-08-15 DIAGNOSIS — R7881 Bacteremia: Secondary | ICD-10-CM | POA: Diagnosis not present

## 2023-08-15 DIAGNOSIS — B9561 Methicillin susceptible Staphylococcus aureus infection as the cause of diseases classified elsewhere: Secondary | ICD-10-CM | POA: Diagnosis not present

## 2023-08-15 DIAGNOSIS — M009 Pyogenic arthritis, unspecified: Secondary | ICD-10-CM | POA: Diagnosis not present

## 2023-08-15 HISTORY — PX: TRANSESOPHAGEAL ECHOCARDIOGRAM (CATH LAB): EP1270

## 2023-08-15 LAB — CULTURE, BLOOD (ROUTINE X 2)

## 2023-08-15 LAB — ECHO TEE
AR max vel: 2.29 cm2
AV Area VTI: 1.87 cm2
AV Area mean vel: 2.02 cm2
AV Mean grad: 3 mmHg
AV Peak grad: 5.2 mmHg
Ao pk vel: 1.14 m/s

## 2023-08-15 LAB — CBC WITH DIFFERENTIAL/PLATELET
Abs Immature Granulocytes: 0.36 10*3/uL — ABNORMAL HIGH (ref 0.00–0.07)
Basophils Absolute: 0.1 10*3/uL (ref 0.0–0.1)
Basophils Relative: 1 %
Eosinophils Absolute: 0.1 10*3/uL (ref 0.0–0.5)
Eosinophils Relative: 1 %
HCT: 41.5 % (ref 39.0–52.0)
Hemoglobin: 13.7 g/dL (ref 13.0–17.0)
Immature Granulocytes: 3 %
Lymphocytes Relative: 13 %
Lymphs Abs: 1.5 10*3/uL (ref 0.7–4.0)
MCH: 31.1 pg (ref 26.0–34.0)
MCHC: 33 g/dL (ref 30.0–36.0)
MCV: 94.1 fL (ref 80.0–100.0)
Monocytes Absolute: 1.2 10*3/uL — ABNORMAL HIGH (ref 0.1–1.0)
Monocytes Relative: 10 %
Neutro Abs: 8.3 10*3/uL — ABNORMAL HIGH (ref 1.7–7.7)
Neutrophils Relative %: 72 %
Platelets: 283 10*3/uL (ref 150–400)
RBC: 4.41 MIL/uL (ref 4.22–5.81)
RDW: 13.1 % (ref 11.5–15.5)
WBC: 11.6 10*3/uL — ABNORMAL HIGH (ref 4.0–10.5)
nRBC: 0 % (ref 0.0–0.2)

## 2023-08-15 LAB — AEROBIC CULTURE W GRAM STAIN (SUPERFICIAL SPECIMEN)

## 2023-08-15 SURGERY — TRANSESOPHAGEAL ECHOCARDIOGRAM (TEE) (CATHLAB)
Anesthesia: Monitor Anesthesia Care

## 2023-08-15 MED ORDER — PHENYLEPHRINE HCL (PRESSORS) 10 MG/ML IV SOLN
INTRAVENOUS | Status: DC | PRN
  Administered 2023-08-15 (×2): 80 ug via INTRAVENOUS

## 2023-08-15 MED ORDER — HYDROMORPHONE HCL 1 MG/ML IJ SOLN
INTRAMUSCULAR | Status: AC
  Filled 2023-08-15: qty 0.5

## 2023-08-15 MED ORDER — SODIUM CHLORIDE 0.9% FLUSH: 3.0000 mL | INTRAVENOUS | Status: DC | PRN

## 2023-08-15 MED ORDER — PROPOFOL 10 MG/ML IV BOLUS
INTRAVENOUS | Status: DC | PRN
  Administered 2023-08-15: 120 ug/kg/min via INTRAVENOUS

## 2023-08-15 MED ORDER — SODIUM CHLORIDE 0.9 % IV SOLN: INTRAVENOUS | Status: DC | PRN

## 2023-08-15 MED ORDER — SODIUM CHLORIDE 0.9% FLUSH: 3.0000 mL | Freq: Two times a day (BID) | INTRAVENOUS | Status: DC

## 2023-08-15 NOTE — Transfer of Care (Signed)
 Immediate Anesthesia Transfer of Care Note  Patient: Paul [name].  Procedure(s) Performed: TRANSESOPHAGEAL ECHOCARDIOGRAM  Patient Location: PACU  Anesthesia Type:MAC  Level of Consciousness: awake and drowsy  Airway & Oxygen Therapy: Patient Spontanous Breathing and Patient connected to nasal cannula oxygen  Post-op Assessment: Report given to RN and Post -op Vital signs reviewed and stable  Post vital signs: Reviewed and stable  Last Vitals:  Vitals Value Taken Time  BP 88/53 08/15/23 1245  Temp 36.3 C 08/15/23 1244  Pulse 67 08/15/23 1246  Resp 20 08/15/23 1246  SpO2 93 % 08/15/23 1246  Vitals shown include unfiled device data.  Last Pain:  Vitals:   08/15/23 1244  TempSrc: Tympanic  PainSc: Asleep         Complications: No notable events documented.

## 2023-08-15 NOTE — TOC Initial Note (Addendum)
 Transition of Care (TOC) - Initial/Assessment Note   Spoke to patient at bedside. Patient from home with wife.   Active with OP PT at Dr Nonnie Beagle office and wants to continue   Will need IV ABX at home. [employer] will be infusion company. Pam a nurse from [facility] will provide education with patient and his wife prior to discharge from hospital. They will have a HHRN , however HHRN will not be present every time a dose is due. Patient voiced understanding. Pam following   Called OP PT with [facility] . Patient goes to [facility] office [phone]. Called same , patient can call and schedule two more visits , after that they will submit for approval for more visits  Patient Details  Name: Paul [name]. MRN: [id number] Date of Birth: 01/23/1946  Transition of Care [facility]) CM/SW Contact:    Terre Ferri, RN Phone Number: 08/15/2023, 3:45 PM  Clinical Narrative:                   Expected Discharge Plan: Home w Home Health Services Barriers to Discharge: Continued Medical Work up   Patient Goals and CMS Choice Patient states their goals for this hospitalization and ongoing recovery are:: to return to home CMS Medicare.gov Compare Post Acute Care list provided to:: Patient Choice offered to / list presented to : Patient      Expected Discharge Plan and Services   Discharge Planning Services: CM Consult Post Acute Care Choice:  (Infusion) Living arrangements for the past 2 months: Single Family Home                 DME Arranged: N/A DME Agency: NA         HH Agency: [facility] Date HH Agency Contacted: 08/15/23 Time HH Agency Contacted: 1544 Representative spoke with at [facility] Agency: Pam  Prior Living Arrangements/Services Living arrangements for the past 2 months: Single Family Home Lives with:: Spouse Patient language and need for interpreter reviewed:: Yes Do you feel safe going back to the place where you live?: Yes      Need for Family Participation in Patient  Care: Yes (Comment) Care giver support system in place?: Yes (comment) Current home services:  (OP PT) Criminal Activity/Legal Involvement Pertinent to Current Situation/Hospitalization: No - Comment as needed  Activities of Daily Living   ADL Screening (condition at time of admission) Independently performs ADLs?: Yes (appropriate for developmental age) Is the patient deaf or have difficulty hearing?: No Does the patient have difficulty seeing, even when wearing glasses/contacts?: No Does the patient have difficulty concentrating, remembering, or making decisions?: No  Permission Sought/Granted   Permission granted to share information with : Yes, Verbal Permission Granted     Permission granted to share info w AGENCY: [employer]        Emotional Assessment Appearance:: Appears stated age Attitude/Demeanor/Rapport: Engaged Affect (typically observed): Appropriate Orientation: : Oriented to Self, Oriented to Place, Oriented to  Time, Oriented to Situation Alcohol / Substance Use: Not Applicable Psych Involvement: No (comment)  Admission diagnosis:  Septic arthritis (HCC) [M00.9] Patient Active Problem List   Diagnosis Date Noted   MSSA bacteremia 08/13/2023   Septic arthritis (HCC) 08/12/2023   Gram positive bacterial infection 08/12/2023   History of atrial fibrillation without current medication 08/12/2023   Skin lesion 07/16/2023   Edema 05/20/2023   New onset atrial fibrillation (HCC) 05/03/2023   Atherosclerosis of aorta (HCC) 05/03/2023   Mixed hyperlipidemia 05/03/2023  Agatston coronary artery calcium  score less than 100 05/03/2023   Benign hypertension 05/03/2023   Quadriceps tendon rupture 05/03/2023   Rupture of bilateral distal quadriceps tendon 04/30/2023   Intractable pain 04/29/2023   Thigh hematoma 04/28/2023   Hemarthrosis of right knee 04/28/2023   Memory problem 04/16/2023   Upper airway cough syndrome 02/28/2023   Tinnitus 12/17/2022   COPD, mild  (HCC) 10/25/2022   Asymmetrical thyroid  10/25/2022   Labral tear of shoulder, left, initial encounter 05/30/2022   CAP (community acquired pneumonia) 02/27/2022   Hoarse voice quality 01/08/2022   Family history of skin cancer 06/23/2021   History of malignant neoplasm of skin 06/23/2021   Melanocytic nevi of trunk 06/23/2021   Other seborrheic keratosis 06/23/2021   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   Hypogonadism in male 09/06/2020   Coronary atherosclerosis 05/30/2020   Hearing loss 05/30/2020   Onychomycosis 05/30/2020   Near syncope 02/24/2019   Gross hematuria 12/10/2018   Constipation 12/10/2018   Intertrigo 11/19/2018   Cervical radiculopathy 10/03/2017   Bursitis of left shoulder 10/03/2017   Trigger point of left shoulder region 08/16/2017   Periscapular pain 08/06/2017   Reactive airway disease 06/08/2017   Rotator cuff tear 12/05/2016   Easy bruising 11/09/2016   Subscapular bursitis 10/12/2016   Supraspinatus tendinitis, right 10/12/2016   Shoulder pain, acute 10/11/2016   Knee pain, left 10/11/2016   Rash and nonspecific skin eruption 04/06/2016   Inguinal hernia 10/10/2015   Asthmatic bronchitis 03/18/2014   GERD (gastroesophageal reflux disease) 02/11/2014   Dysphagia 02/11/2014   Cough 02/11/2014   Gum abscess 10/02/2013   Neck pain 07/03/2013   Hypertension 01/22/2012   TMJ arthralgia 04/17/2011   Well adult exam 01/08/2011   Chronic low back pain 10/16/2010   Kidney cyst, acquired 09/11/2010   URINARY CALCULUS 05/05/2010   ABSCESS, PERIRECTAL 04/07/2010   EPISTAXIS 04/07/2010   Headache 02/24/2010   PARESTHESIA 02/24/2010   INSOMNIA, CHRONIC 11/10/2009   SINUSITIS- ACUTE-NOS 10/21/2009   Allergic rhinitis 01/07/2009   ELBOW PAIN 01/07/2009   Anxiety disorder 12/16/2007   Depression 12/16/2007   MYALGIA 12/16/2007   CARPAL TUNNEL SYNDROME 01/31/2007   HEPATITIS C CARRIER 01/31/2007   HEPATITIS C 07/31/2006    Dyslipidemia 07/31/2006   Attention deficit disorder 07/31/2006   BENIGN PROSTATIC HYPERTROPHY, HX OF 07/31/2006   TONSILLECTOMY AND ADENOIDECTOMY, HX OF 07/31/2006   UMBILICAL HERNIORRHAPHY, HX OF 07/31/2006   PCP:  Genia Kettering, MD Pharmacy:   [facility] - [place], [place] - [address] [place] [address] Phone: [phone] Fax: [phone]  [id number] Jon[place] Nestle, [place] - [address] AT [facility] [address] & [facility] [address] [place] [address] Phone: [phone] Fax: [phone]     Social Drivers of Health (SDOH) Social History: SDOH Screenings   Food Insecurity: No Food Insecurity (08/12/2023)  Housing: Low Risk  (08/12/2023)  Transportation Needs: No Transportation Needs (08/12/2023)  Utilities: Not At Risk (08/12/2023)  Depression (PHQ2-9): Low Risk  (04/16/2023)  Social Connections: Moderately Isolated (08/12/2023)  Tobacco Use: Medium Risk (08/13/2023)   SDOH Interventions:     Readmission Risk Interventions    05/01/2023    1:40 PM  Readmission Risk Prevention Plan  Transportation Screening Complete  PCP or Specialist Appt within 5-7 Days Complete  Home Care Screening Complete  Medication Review (RN CM) Complete

## 2023-08-15 NOTE — Anesthesia Preprocedure Evaluation (Signed)
 Anesthesia Evaluation  Patient identified by MRN, date of birth, ID band Patient awake    Reviewed: Allergy & Precautions, NPO status , Patient's Chart, lab work & pertinent test results  Airway Mallampati: II  TM Distance: >3 FB Neck ROM: Full    Dental  (+) Teeth Intact, Dental Advisory Given   Pulmonary asthma , COPD, former smoker   Pulmonary exam normal breath sounds clear to auscultation       Cardiovascular hypertension, Pt. on medications + CAD  Normal cardiovascular exam Rhythm:Regular Rate:Normal  IMPRESSIONS     1. Left ventricular ejection fraction, by estimation, is 60 to 65%. The  left ventricle has normal function. The left ventricle has no regional  wall motion abnormalities. There is mild concentric left ventricular  hypertrophy. Left ventricular diastolic  parameters were normal.   2. Right ventricular systolic function is normal. The right ventricular  size is normal.   3. The mitral valve is normal in structure. Mild mitral valve  regurgitation. No evidence of mitral stenosis.   4. The aortic valve is tricuspid. There is mild thickening of the aortic  valve. Aortic valve regurgitation is not visualized. Aortic valve  sclerosis/calcification is present, without any evidence of aortic  stenosis.   5. The inferior vena cava is normal in size with greater than 50%  respiratory variability, suggesting right atrial pressure of 3 mmHg.   6. Agitated saline contrast bubble study was negative, with no evidence  of any interatrial shunt.   Conclusion(s)/Recommendation(s): No evidence of valvular vegetations on  this transthoracic echocardiogram. Consider a transesophageal  echocardiogram to exclude infective endocarditis if clinically indicated.     Neuro/Psych  Headaches PSYCHIATRIC DISORDERS Anxiety Depression     Neuromuscular disease    GI/Hepatic ,GERD  Medicated and Controlled,,(+) Hepatitis -, C   Endo/Other  negative endocrine ROS    Renal/GU negative Renal ROS     Musculoskeletal  (+) Arthritis ,  Post op infection L knee   Abdominal   Peds  (+) ATTENTION DEFICIT DISORDER WITHOUT HYPERACTIVITY Hematology negative hematology ROS (+)   Anesthesia Other Findings Bacteremia   Reproductive/Obstetrics                              Anesthesia Physical Anesthesia Plan  ASA: 3  Anesthesia Plan: MAC   Post-op Pain Management: Minimal or no pain anticipated   Induction: Intravenous  PONV Risk Score and Plan: 2 and Treatment may vary due to age or medical condition and TIVA  Airway Management Planned: Natural Airway and Simple Face Mask  Additional Equipment: None  Intra-op Plan:   Post-operative Plan:   Informed Consent: I have reviewed the patients History and Physical, chart, labs and discussed the procedure including the risks, benefits and alternatives for the proposed anesthesia with the patient or authorized representative who has indicated his/her understanding and acceptance.     Dental advisory given  Plan Discussed with: CRNA  Anesthesia Plan Comments:          Anesthesia Quick Evaluation

## 2023-08-15 NOTE — Progress Notes (Signed)
 [facility]  Date of Admission:  08/12/2023     Reason for Follow Up: Septic arthritis [facility])  Total days of antibiotics 4         ASSESSMENT:  [name] was negative for vegetations/endocarditis and blood culture from 08/14/23 remain without growth in the setting of MSSA post-operative infection of the left knee s/p I&D. Discussed plan of care for at least 4 weeks of Cefazolin  via PICC line. Will ensure blood cultures are clear prior to insertion of the PICC line. Continue post-operative wound care per Orthopedics. At this time there does not appear to be any plans for repeat debridement. Monitor cultures for clearance of bacteremia. Continue standard precautions. Remaining medical and supportive care per Internal Medicine.   PLAN:  Continue current dose of Cefazolin .  Monitor blood cultures for clearance of bacteremia.  Post-operative wound care per Orthopedics. Hold PICC placement pending clearance of blood cultures.  Standard/universal precautions. Remaining medical and supportive care per Internal Medicine.   Principal Problem:   Septic arthritis (HCC) Active Problems:   MSSA bacteremia   Dyslipidemia   Depression   HEPATITIS C CARRIER   GERD (gastroesophageal reflux disease)   Cough   COPD, mild (HCC)   Gram positive bacterial infection   History of atrial fibrillation without current medication    buPROPion   150 mg Oral Daily   docusate sodium   100 mg Oral BID   enoxaparin  (LOVENOX ) injection  40 mg Subcutaneous Q24H   finasteride   5 mg Oral Daily   fluticasone  furoate-vilanterol  1 puff Inhalation Daily   guaiFENesin  600 mg Oral BID   HYDROmorphone        metoprolol  tartrate  25 mg Oral BID   mupirocin ointment  1 Application Nasal BID   pantoprazole   40 mg Oral Daily   polyethylene glycol  17 g Oral BID   polyethylene glycol  17 g Oral Daily   senna-docusate  2 tablet Oral QHS    SUBJECTIVE:  Afebrile overnight with no  acute events. Tolerating antibiotics with no adverse side effects. Wife at bedside.   Allergies  Allergen Reactions   Other Itching    PATCHES. Patient states that any patch on his skin causes him to itch      Review of Systems: Review of Systems  Constitutional:  Negative for chills, fever and weight loss.  Respiratory:  Negative for cough, shortness of breath and wheezing.   Cardiovascular:  Negative for chest pain and leg swelling.  Gastrointestinal:  Negative for abdominal pain, constipation, diarrhea, nausea and vomiting.  Skin:  Negative for rash.      OBJECTIVE: Vitals:   08/15/23 1244 08/15/23 1254 08/15/23 1304 08/15/23 1335  BP: (!) 88/53 108/65 109/63 120/72  Pulse: 67 66 63 66  Resp: 15 20 12 18   Temp: (!) 97.4 F (36.3 C)   98.4 F (36.9 C)  TempSrc: Tympanic   Oral  SpO2: 93% 95% 95% 97%  Weight:      Height:       Body mass index is 24.14 kg/m.  Physical Exam Constitutional:      General: He is not in acute distress.    Appearance: He is well-developed.  Cardiovascular:     Rate and Rhythm: Normal rate and regular rhythm.     Heart sounds: Normal heart sounds.  Pulmonary:     Effort: Pulmonary effort is normal.     Breath sounds: Normal breath sounds.  Skin:  General: Skin is warm and dry.  Neurological:     Mental Status: He is alert and oriented to person, place, and time.     Lab Results Lab Results  Component Value Date   WBC 11.6 (H) 08/15/2023   HGB 13.7 08/15/2023   HCT 41.5 08/15/2023   MCV 94.1 08/15/2023   PLT 283 08/15/2023    Lab Results  Component Value Date   CREATININE 0.69 08/14/2023   BUN 26 (H) 08/14/2023   NA 135 08/14/2023   K 3.8 08/14/2023   CL 105 08/14/2023   CO2 22 08/14/2023    Lab Results  Component Value Date   ALT 31 05/28/2023   AST 39 05/28/2023   ALKPHOS 83 05/28/2023   BILITOT 0.5 05/28/2023     Microbiology: Recent Results (from the past 240 hours)  Surgical PCR screen     Status:  Abnormal   Collection Time: 08/12/23  4:25 PM   Specimen: Nasal Mucosa; Nasal Swab  Result Value Ref Range Status   MRSA, PCR NEGATIVE NEGATIVE Final   Staphylococcus aureus POSITIVE (A) NEGATIVE Final    Comment: (NOTE) The Xpert SA Assay (FDA approved for NASAL specimens in patients 98 years of age and older), is one component of a comprehensive surveillance program. It is not intended to diagnose infection nor to guide or monitor treatment. Performed at [facility], [address] [place], [place] [address]   Aerobic Culture w Gram Stain (superficial specimen)     Status: None   Collection Time: 08/12/23  4:58 PM   Specimen: KNEE  Result Value Ref Range Status   Specimen Description KNEE  Final   Special Requests NONE  Final   Gram Stain   Final    FEW WBC PRESENT, PREDOMINANTLY PMN RARE GRAM POSITIVE COCCI IN PAIRS Performed at [facility], [address] [place], [place] [address]    Culture FEW STAPHYLOCOCCUS AUREUS  Final   Report Status 08/15/2023 FINAL  Final   Organism ID, Bacteria STAPHYLOCOCCUS AUREUS  Final      Susceptibility   Staphylococcus aureus - MIC*    CIPROFLOXACIN  <=0.5 SENSITIVE Sensitive     ERYTHROMYCIN >=8 RESISTANT Resistant     GENTAMICIN <=0.5 SENSITIVE Sensitive     OXACILLIN 0.5 SENSITIVE Sensitive     TETRACYCLINE <=1 SENSITIVE Sensitive     VANCOMYCIN 1 SENSITIVE Sensitive     TRIMETH/SULFA <=10 SENSITIVE Sensitive     CLINDAMYCIN RESISTANT Resistant     RIFAMPIN <=0.5 SENSITIVE Sensitive     Inducible Clindamycin POSITIVE Resistant     LINEZOLID 2 SENSITIVE Sensitive     * FEW STAPHYLOCOCCUS AUREUS  Culture, blood (Routine X 2) w Reflex to ID Panel     Status: Abnormal   Collection Time: 08/12/23  6:07 PM   Specimen: BLOOD  Result Value Ref Range Status   Specimen Description BLOOD SITE NOT SPECIFIED  Final   Special Requests   Final    BOTTLES DRAWN AEROBIC AND ANAEROBIC Blood Culture results may not be optimal due to  an inadequate volume of blood received in culture bottles   Culture  Setup Time   Final    GRAM POSITIVE COCCI IN CLUSTERS IN BOTH AEROBIC AND ANAEROBIC BOTTLES CRITICAL VALUE NOTED.  VALUE IS CONSISTENT WITH PREVIOUSLY REPORTED AND CALLED VALUE.    Culture (A)  Final    STAPHYLOCOCCUS AUREUS SUSCEPTIBILITIES PERFORMED ON PREVIOUS CULTURE WITHIN THE LAST 5 DAYS. Performed at [facility], [address] [place], [place] [address]    Report Status 08/15/2023 FINAL  Final  Culture, blood (Routine X 2) w Reflex to ID Panel     Status: Abnormal   Collection Time: 08/12/23  6:16 PM   Specimen: BLOOD  Result Value Ref Range Status   Specimen Description BLOOD SITE NOT SPECIFIED  Final   Special Requests   Final    BOTTLES DRAWN AEROBIC AND ANAEROBIC Blood Culture results may not be optimal due to an inadequate volume of blood received in culture bottles   Culture  Setup Time   Final    GRAM POSITIVE COCCI IN CLUSTERS IN BOTH AEROBIC AND ANAEROBIC BOTTLES CRITICAL RESULT CALLED TO, READ BACK BY AND VERIFIED WITH: PHARMD ELIZABETH MARTIN ON 08/13/23 @ 1232 BY DRT Performed at [facility], [address] [place], [place] [address]    Culture STAPHYLOCOCCUS AUREUS (A)  Final   Report Status 08/15/2023 FINAL  Final   Organism ID, Bacteria STAPHYLOCOCCUS AUREUS  Final      Susceptibility   Staphylococcus aureus - MIC*    CIPROFLOXACIN  <=0.5 SENSITIVE Sensitive     ERYTHROMYCIN >=8 RESISTANT Resistant     GENTAMICIN <=0.5 SENSITIVE Sensitive     OXACILLIN <=0.25 SENSITIVE Sensitive     TETRACYCLINE <=1 SENSITIVE Sensitive     VANCOMYCIN 1 SENSITIVE Sensitive     TRIMETH/SULFA <=10 SENSITIVE Sensitive     CLINDAMYCIN RESISTANT Resistant     RIFAMPIN <=0.5 SENSITIVE Sensitive     Inducible Clindamycin POSITIVE Resistant     LINEZOLID 2 SENSITIVE Sensitive     * STAPHYLOCOCCUS AUREUS  Blood Culture ID Panel (Reflexed)     Status: Abnormal   Collection Time: 08/12/23  6:16 PM   Result Value Ref Range Status   Enterococcus faecalis NOT DETECTED NOT DETECTED Final   Enterococcus Faecium NOT DETECTED NOT DETECTED Final   Listeria monocytogenes NOT DETECTED NOT DETECTED Final   Staphylococcus species DETECTED (A) NOT DETECTED Final    Comment: CRITICAL RESULT CALLED TO, READ BACK BY AND VERIFIED WITH: PHARMD ELIZABETH MARTIN ON 08/13/23 @ 1232 BY DRT    Staphylococcus aureus (BCID) DETECTED (A) NOT DETECTED Final    Comment: CRITICAL RESULT CALLED TO, READ BACK BY AND VERIFIED WITH: PHARMD ELIZABETH MARTIN ON 08/13/23 @ 1232 BY DRT    Staphylococcus epidermidis NOT DETECTED NOT DETECTED Final   Staphylococcus lugdunensis NOT DETECTED NOT DETECTED Final   Streptococcus species NOT DETECTED NOT DETECTED Final   Streptococcus agalactiae NOT DETECTED NOT DETECTED Final   Streptococcus pneumoniae NOT DETECTED NOT DETECTED Final   Streptococcus pyogenes NOT DETECTED NOT DETECTED Final   A.calcoaceticus-baumannii NOT DETECTED NOT DETECTED Final   Bacteroides fragilis NOT DETECTED NOT DETECTED Final   Enterobacterales NOT DETECTED NOT DETECTED Final   Enterobacter cloacae complex NOT DETECTED NOT DETECTED Final   Escherichia coli NOT DETECTED NOT DETECTED Final   Klebsiella aerogenes NOT DETECTED NOT DETECTED Final   Klebsiella oxytoca NOT DETECTED NOT DETECTED Final   Klebsiella pneumoniae NOT DETECTED NOT DETECTED Final   Proteus species NOT DETECTED NOT DETECTED Final   Salmonella species NOT DETECTED NOT DETECTED Final   Serratia marcescens NOT DETECTED NOT DETECTED Final   Haemophilus influenzae NOT DETECTED NOT DETECTED Final   Neisseria meningitidis NOT DETECTED NOT DETECTED Final   Pseudomonas aeruginosa NOT DETECTED NOT DETECTED Final   Stenotrophomonas maltophilia NOT DETECTED NOT DETECTED Final   Candida albicans NOT DETECTED NOT DETECTED Final  Candida auris NOT DETECTED NOT DETECTED Final   Candida glabrata NOT DETECTED NOT DETECTED Final   Candida  krusei NOT DETECTED NOT DETECTED Final   Candida parapsilosis NOT DETECTED NOT DETECTED Final   Candida tropicalis NOT DETECTED NOT DETECTED Final   Cryptococcus neoformans/gattii NOT DETECTED NOT DETECTED Final   Meth resistant mecA/C and MREJ NOT DETECTED NOT DETECTED Final    Comment: Performed at [facility], [address], [place], [place] [address]  Aerobic/Anaerobic Culture w Gram Stain (surgical/deep wound)     Status: None (Preliminary result)   Collection Time: 08/13/23  3:42 PM   Specimen: Abscess  Result Value Ref Range Status   Specimen Description ABSCESS LEFT KNEE  Final   Special Requests NONE  Final   Gram Stain   Final    ABUNDANT WBC PRESENT, PREDOMINANTLY PMN RARE GRAM POSITIVE COCCI IN PAIRS    Culture   Final    FEW STAPHYLOCOCCUS AUREUS SUSCEPTIBILITIES TO FOLLOW Performed at [facility], [address] [place] [address] [place], [place] [address]    Report Status PENDING  Incomplete  Culture, blood (Routine X 2) w Reflex to ID Panel     Status: None (Preliminary result)   Collection Time: 08/14/23  6:10 AM   Specimen: BLOOD RIGHT ARM  Result Value Ref Range Status   Specimen Description BLOOD RIGHT ARM  Final   Special Requests   Final    BOTTLES DRAWN AEROBIC AND ANAEROBIC Blood Culture adequate volume   Culture   Final    NO GROWTH 1 DAY Performed at [facility], [address], [place], [place] [address]    Report Status PENDING  Incomplete  Culture, blood (Routine X 2) w Reflex to ID Panel     Status: None (Preliminary result)   Collection Time: 08/14/23  6:19 AM   Specimen: BLOOD RIGHT HAND  Result Value Ref Range Status   Specimen Description BLOOD RIGHT HAND  Final   Special Requests   Final    BOTTLES DRAWN AEROBIC AND ANAEROBIC Blood Culture adequate volume   Culture   Final    NO GROWTH 1 DAY Performed at [facility], [address], [place], [place] [address]    Report Status PENDING  Incomplete   I have personally spent 32  minutes involved in face-to-face and non-face-to-face activities for this patient on the day of the visit. Professional time spent includes the following activities: Preparing to see the patient (review of tests), performing a medically appropriate examination, ordering medications, communicating with other health care professionals, documenting clinical information in the EMR, communicating results and counseling patient and family regarding medication and plan of care, and care coordination.    Greg Cayden Granholm, NP [facility]  08/15/2023  3:16 PM

## 2023-08-15 NOTE — Interval H&P Note (Signed)
 History and Physical Interval Note:  08/15/2023 10:57 AM  Paul [name].  has presented today for surgery, with the diagnosis of bacteremia.  The various methods of treatment have been discussed with the patient and family. After consideration of risks, benefits and other options for treatment, the patient has consented to  Procedure(s): TRANSESOPHAGEAL ECHOCARDIOGRAM (N/A) as a surgical intervention.  The patient's history has been reviewed, patient examined, no change in status, stable for surgery.  I have reviewed the patient's chart and labs.  Questions were answered to the patient's satisfaction.    Informed Consent   Shared Decision Making/Informed Consent   The risks [esophageal damage, perforation (1:10,000 risk), bleeding, pharyngeal hematoma as well as other potential complications associated with conscious sedation including aspiration, arrhythmia, respiratory failure and death], benefits (treatment guidance and diagnostic support) and alternatives of a transesophageal echocardiogram were discussed in detail with [name] and he is willing to proceed.      Contact person: Wife  Indication:  bacteremia and septic knee  Olinda Bertrand, DO, [facility]  A Division of  [facility] [address], [place], [place] [address]  [place], [place] [address] 10:58 AM 08/15/23

## 2023-08-15 NOTE — CV Procedure (Signed)
    TRANSESOPHAGEAL ECHOCARDIOGRAM   NAME:  Paul [name].    MRN: [id number] DOB:  Sep 07, 1945    ADMIT DATE: 08/12/2023  INDICATIONS: Bacteremia and septic arthritis  PROCEDURE:   Informed consent was obtained prior to the procedure. The risks, benefits and alternatives for the procedure were discussed and the patient comprehended these risks.  Risks include, but are not limited to, cough, sore throat, vomiting, nausea, somnolence, esophageal and stomach trauma or perforation, bleeding, low blood pressure, aspiration, pneumonia, infection, trauma to the teeth and death.    Procedural time out performed.   Anesthesia was administered by anesthesia team (see their records).  The transesophageal probe was inserted in the esophagus and stomach without difficulty and multiple views were obtained.   COMPLICATIONS:    There were no immediate complications.  KEY FINDINGS:  Left ventricular ejection fraction 60 to 65%.  No echocardiographic evidence of valvular vegetation. Full report to follow. Further management per primary team.   Olinda Bertrand, DO, [facility]  A Division of [facility] [address], [place], [place] [address]  [place], [place] [address] Pager: [phone] Office: [phone] 08/15/23 1:11 PM

## 2023-08-15 NOTE — Anesthesia Postprocedure Evaluation (Signed)
 Anesthesia Post Note  Patient: Paul [name].  Procedure(s) Performed: TRANSESOPHAGEAL ECHOCARDIOGRAM     Patient location during evaluation: PACU Anesthesia Type: MAC Level of consciousness: awake and alert Pain management: pain level controlled Vital Signs Assessment: post-procedure vital signs reviewed and stable Respiratory status: spontaneous breathing, nonlabored ventilation, respiratory function stable and patient connected to nasal cannula oxygen Cardiovascular status: stable and blood pressure returned to baseline Postop Assessment: no apparent nausea or vomiting Anesthetic complications: no   No notable events documented.  Last Vitals:  Vitals:   08/15/23 1304 08/15/23 1335  BP: 109/63 120/72  Pulse: 63 66  Resp: 12 18  Temp:  36.9 C  SpO2: 95% 97%    Last Pain:  Vitals:   08/15/23 1335  TempSrc: Oral  PainSc: 0-No pain                 Lethaniel Rave

## 2023-08-15 NOTE — Progress Notes (Signed)
 [facility] PROGRESS NOTE    Progress Note  Paul [name].  QMV:[id number] DOB: 1945/11/03 DOA: 08/12/2023 PCP: Genia Kettering, MD     Brief Narrative:   Paul [name]. is an 78 y.o. male past medical history of essential hypertension, hyperlipidemia, seen on February 2025 for bilateral quadricep tendon rupture underwent repair on 04/30/2023 and discharged to inpatient rehab on 05/07/2023, in April 2025 seen by orthopedic surgery underwent revision surgery as an outpatient.  He sustained another injury on 08/08/2023 to the left knee had significant swelling aspiration was performed with improvement in the pain was started on prednisone  comes back on 08/22/2023 draining pus from the left knee area, blood cultures grew MSSA underwent I&D on 08/13/2023 for septic left knee arthritis  Assessment/Plan:   Left knee septic arthritis/MSSA bacteremia: Washout by orthopedic surgery on 08/13/2023. Blood cultures grew MSSA infectious disease was consulted recommended to continue IV cefazolin . 2D echo showed an EF of 60% no wall motion abnormality. TEE to be done on 08/15/2023 to rule out endocarditis Surveillance blood culture on 08/13/2023 have been negative till date  Will need PICC line.  Mild COPD: No evidence of exacerbation continue inhalers.  History of chronic atrial fibrillation does not take any medication: Currently in sinus rhythm. He relates his only takes metoprolol  currently off Eliquis .  Essential hypertension: Continue metoprolol  hold all other antihypertensive medication.  Dyslipidemia: Continue statins.  Depression: Continue Wellbutrin .  Hepatitis C carrier: Noted.  GERD: Continue PPI.  DVT prophylaxis: lovenox  Family Communication:none Status is: Inpatient Remains inpatient appropriate because: Septic knee and MSSA bacteremia    Code Status:     Code Status Orders  (From admission, onward)           Start     Ordered   08/12/23 1656   Full code  Continuous       Question:  By:  Answer:  Consent: discussion documented in EHR   08/12/23 1656           Code Status History     Date Active Date Inactive Code Status Order ID Comments User Context   05/03/2023 1615 05/09/2023 1739 Full Code [id number]  Geradine Kitten Inpatient   05/03/2023 1615 05/03/2023 1615 Full Code [id number]  Geradine Kitten Inpatient   04/28/2023 2243 05/03/2023 1613 Full Code [id number]  Selene Dais, MD ED         IV Access:   Peripheral IV   Procedures and diagnostic studies:   ECHOCARDIOGRAM COMPLETE BUBBLE STUDY Result Date: 08/14/2023    ECHOCARDIOGRAM REPORT   Patient Name:   Paul [name]. Date of Exam: 08/14/2023 Medical Rec #:  [id number]            Height:       72.0 in Accession #:    [id number]           Weight:       178.0 lb Date of Birth:  [birth year]            BSA:          2.028 m Patient Age:    78 years             BP:           115/85 mmHg Patient Gender: M                    HR:  59 bpm. Exam Location:  Inpatient Procedure: 2D Echo, Cardiac Doppler and Color Doppler (Both Spectral and Color            Flow Doppler were utilized during procedure). Indications:    MSSA bacteremia  History:        Patient has prior history of Echocardiogram examinations, most                 recent 05/03/2023. Signs/Symptoms:Syncope; Risk                 Factors:Hypertension.  Sonographer:    Janette Medley Referring Phys: [id number] [facility] M GAWNE IMPRESSIONS  1. Left ventricular ejection fraction, by estimation, is 60 to 65%. The left ventricle has normal function. The left ventricle has no regional wall motion abnormalities. There is mild concentric left ventricular hypertrophy. Left ventricular diastolic parameters were normal.  2. Right ventricular systolic function is normal. The right ventricular size is normal.  3. The mitral valve is normal in structure. Mild mitral valve regurgitation. No evidence of mitral stenosis.   4. The aortic valve is tricuspid. There is mild thickening of the aortic valve. Aortic valve regurgitation is not visualized. Aortic valve sclerosis/calcification is present, without any evidence of aortic stenosis.  5. The inferior vena cava is normal in size with greater than 50% respiratory variability, suggesting right atrial pressure of 3 mmHg.  6. Agitated saline contrast bubble study was negative, with no evidence of any interatrial shunt. Conclusion(s)/Recommendation(s): No evidence of valvular vegetations on this transthoracic echocardiogram. Consider a transesophageal echocardiogram to exclude infective endocarditis if clinically indicated. FINDINGS  Left Ventricle: Left ventricular ejection fraction, by estimation, is 60 to 65%. The left ventricle has normal function. The left ventricle has no regional wall motion abnormalities. The left ventricular internal cavity size was normal in size. There is  mild concentric left ventricular hypertrophy. Left ventricular diastolic parameters were normal. Right Ventricle: The right ventricular size is normal. No increase in right ventricular wall thickness. Right ventricular systolic function is normal. Left Atrium: Left atrial size was normal in size. Right Atrium: Right atrial size was normal in size. Pericardium: There is no evidence of pericardial effusion. Mitral Valve: The mitral valve is normal in structure. Mild mitral valve regurgitation. No evidence of mitral valve stenosis. Tricuspid Valve: The tricuspid valve is normal in structure. Tricuspid valve regurgitation is not demonstrated. No evidence of tricuspid stenosis. Aortic Valve: The aortic valve is tricuspid. There is mild thickening of the aortic valve. Aortic valve regurgitation is not visualized. Aortic valve sclerosis/calcification is present, without any evidence of aortic stenosis. Pulmonic Valve: The pulmonic valve was normal in structure. Pulmonic valve regurgitation is mild. No evidence of  pulmonic stenosis. Aorta: The aortic root is normal in size and structure. Venous: The inferior vena cava is normal in size with greater than 50% respiratory variability, suggesting right atrial pressure of 3 mmHg. IAS/Shunts: No atrial level shunt detected by color flow Doppler. Agitated saline contrast bubble study was negative, with no evidence of any interatrial shunt.  LEFT VENTRICLE PLAX 2D LVIDd:         3.60 cm   Diastology LVIDs:         2.40 cm   LV e' medial:    10.00 cm/s LV PW:         1.50 cm   LV E/e' medial:  9.3 LV IVS:        1.50 cm   LV e' lateral:   10.80 cm/s  LVOT diam:     2.00 cm   LV E/e' lateral: 8.6 LV SV:         83 LV SV Index:   41 LVOT Area:     3.14 cm  RIGHT VENTRICLE             IVC RV S prime:     16.00 cm/s  IVC diam: 2.00 cm TAPSE (M-mode): 2.7 cm LEFT ATRIUM           Index        RIGHT ATRIUM           Index LA diam:      3.70 cm 1.82 cm/m   RA Area:     13.30 cm LA Vol (A2C): 34.9 ml 17.21 ml/m  RA Volume:   30.10 ml  14.85 ml/m LA Vol (A4C): 26.8 ml 13.22 ml/m  AORTIC VALVE LVOT Vmax:   112.00 cm/s LVOT Vmean:  81.200 cm/s LVOT VTI:    0.265 m  AORTA Ao Root diam: 3.60 cm Ao Asc diam:  3.70 cm MITRAL VALVE MV Area (PHT): 3.12 cm    SHUNTS MV Decel Time: 243 msec    Systemic VTI:  0.26 m MV E velocity: 93.30 cm/s  Systemic Diam: 2.00 cm MV A velocity: 79.70 cm/s MV E/A ratio:  1.17 Jules Oar MD Electronically signed by Jules Oar MD Signature Date/Time: 08/14/2023/3:20:01 PM    Final      Medical Consultants:   None.   Subjective:    Paul Daisy Jr. pain controlled with still having diarrhea  Objective:    Vitals:   08/14/23 1446 08/14/23 2000 08/15/23 0443 08/15/23 0814  BP: 129/75 130/65 128/76 (!) 144/72  Pulse: 65 77  73  Resp: 16   16  Temp: 97.7 F (36.5 C) 98.7 F (37.1 C) 98 F (36.7 C) 98.2 F (36.8 C)  TempSrc:  Oral Oral   SpO2: 93%  98% 97%  Weight:      Height:       SpO2: 97 % O2 Flow Rate (L/min): 5  L/min   Intake/Output Summary (Last 24 hours) at 08/15/2023 0841 Last data filed at 08/15/2023 0836 Gross per 24 hour  Intake --  Output 700 ml  Net -700 ml   Filed Weights   08/12/23 1600 08/13/23 1341  Weight: 81 kg 80.7 kg    Exam: General exam: In no acute distress. Respiratory system: Good air movement and clear to auscultation. Cardiovascular system: S1 & S2 heard, RRR. No JVD. Gastrointestinal system: Abdomen is nondistended, soft and nontender Extremities: No pedal edema. Skin: No rashes, lesions or ulcers Psychiatry: Judgement and insight appear normal. Mood & affect appropriate.  Data Reviewed:    Labs: Basic Metabolic Panel: Recent Labs  Lab 08/12/23 1811 08/13/23 1236 08/14/23 0610  NA 135 138 135  K 4.0 4.3 3.8  CL 99 105 105  CO2 24 24 22   GLUCOSE 126* 111* 118*  BUN 33* 29* 26*  CREATININE 0.98 0.88 0.69  CALCIUM  9.5 9.1 8.5*  MG  --  2.0 2.0   GFR Estimated Creatinine Clearance: 82.2 mL/min (by C-G formula based on SCr of 0.69 mg/dL). Liver Function Tests: No results for input(s): "AST", "ALT", "ALKPHOS", "BILITOT", "PROT", "ALBUMIN" in the last 168 hours. No results for input(s): "LIPASE", "AMYLASE" in the last 168 hours. No results for input(s): "AMMONIA" in the last 168 hours. Coagulation profile Recent Labs  Lab 08/12/23 1811  INR 1.0  COVID-19 Labs  Recent Labs    08/12/23 1811  CRP 4.4*    Lab Results  Component Value Date   SARSCOV2NAA NEGATIVE 02/06/2019    CBC: Recent Labs  Lab 08/12/23 1811 08/13/23 1236 08/14/23 0610  WBC 14.4* 14.2* 14.7*  NEUTROABS 12.7*  --  11.9*  HGB 14.7 14.6 13.7  HCT 43.6 44.4 41.4  MCV 93.0 93.5 93.7  PLT 323 306 295   Cardiac Enzymes: No results for input(s): "CKTOTAL", "CKMB", "CKMBINDEX", "TROPONINI" in the last 168 hours. BNP (last 3 results) Recent Labs    05/28/23 1513  PROBNP 90   CBG: No results for input(s): "GLUCAP" in the last 168 hours. D-Dimer: No results for  input(s): "DDIMER" in the last 72 hours. Hgb A1c: No results for input(s): "HGBA1C" in the last 72 hours. Lipid Profile: No results for input(s): "CHOL", "HDL", "LDLCALC", "TRIG", "CHOLHDL", "LDLDIRECT" in the last 72 hours. Thyroid  function studies: No results for input(s): "TSH", "T4TOTAL", "T3FREE", "THYROIDAB" in the last 72 hours.  Invalid input(s): "FREET3" Anemia work up: No results for input(s): "VITAMINB12", "FOLATE", "FERRITIN", "TIBC", "IRON", "RETICCTPCT" in the last 72 hours. Sepsis Labs: Recent Labs  Lab 08/12/23 1811 08/13/23 1236 08/14/23 0610  WBC 14.4* 14.2* 14.7*   Microbiology Recent Results (from the past 240 hours)  Surgical PCR screen     Status: Abnormal   Collection Time: 08/12/23  4:25 PM   Specimen: Nasal Mucosa; Nasal Swab  Result Value Ref Range Status   MRSA, PCR NEGATIVE NEGATIVE Final   Staphylococcus aureus POSITIVE (A) NEGATIVE Final    Comment: (NOTE) The Xpert SA Assay (FDA approved for NASAL specimens in patients 15 years of age and older), is one component of a comprehensive surveillance program. It is not intended to diagnose infection nor to guide or monitor treatment. Performed at [facility], [address] [place], [place] [address]   Aerobic Culture w Gram Stain (superficial specimen)     Status: None (Preliminary result)   Collection Time: 08/12/23  4:58 PM   Specimen: KNEE  Result Value Ref Range Status   Specimen Description KNEE  Final   Special Requests NONE  Final   Gram Stain   Final    FEW WBC PRESENT, PREDOMINANTLY PMN RARE GRAM POSITIVE COCCI IN PAIRS    Culture   Final    FEW STAPHYLOCOCCUS AUREUS SUSCEPTIBILITIES TO FOLLOW Performed at [facility], [address] [place], [place] [address]    Report Status PENDING  Incomplete  Culture, blood (Routine X 2) w Reflex to ID Panel     Status: Abnormal (Preliminary result)   Collection Time: 08/12/23  6:07 PM   Specimen: BLOOD  Result Value Ref  Range Status   Specimen Description BLOOD SITE NOT SPECIFIED  Final   Special Requests   Final    BOTTLES DRAWN AEROBIC AND ANAEROBIC Blood Culture results may not be optimal due to an inadequate volume of blood received in culture bottles   Culture  Setup Time   Final    GRAM POSITIVE COCCI IN CLUSTERS IN BOTH AEROBIC AND ANAEROBIC BOTTLES CRITICAL VALUE NOTED.  VALUE IS CONSISTENT WITH PREVIOUSLY REPORTED AND CALLED VALUE. Performed at [facility], [address] [place], [place] [address]    Culture STAPHYLOCOCCUS AUREUS (A)  Final   Report Status PENDING  Incomplete  Culture, blood (Routine X 2) w Reflex to ID Panel     Status: Abnormal (Preliminary result)   Collection  Time: 08/12/23  6:16 PM   Specimen: BLOOD  Result Value Ref Range Status   Specimen Description BLOOD SITE NOT SPECIFIED  Final   Special Requests   Final    BOTTLES DRAWN AEROBIC AND ANAEROBIC Blood Culture results may not be optimal due to an inadequate volume of blood received in culture bottles   Culture  Setup Time   Final    GRAM POSITIVE COCCI IN CLUSTERS IN BOTH AEROBIC AND ANAEROBIC BOTTLES CRITICAL RESULT CALLED TO, READ BACK BY AND VERIFIED WITH: PHARMD ELIZABETH MARTIN ON 08/13/23 @ 1232 BY DRT    Culture (A)  Final    STAPHYLOCOCCUS AUREUS SUSCEPTIBILITIES TO FOLLOW Performed at [facility], [address] [place], [place] [address]    Report Status PENDING  Incomplete  Blood Culture ID Panel (Reflexed)     Status: Abnormal   Collection Time: 08/12/23  6:16 PM  Result Value Ref Range Status   Enterococcus faecalis NOT DETECTED NOT DETECTED Final   Enterococcus Faecium NOT DETECTED NOT DETECTED Final   Listeria monocytogenes NOT DETECTED NOT DETECTED Final   Staphylococcus species DETECTED (A) NOT DETECTED Final    Comment: CRITICAL RESULT CALLED TO, READ BACK BY AND VERIFIED WITH: PHARMD ELIZABETH MARTIN ON 08/13/23 @ 1232 BY DRT    Staphylococcus aureus (BCID) DETECTED (A) NOT  DETECTED Final    Comment: CRITICAL RESULT CALLED TO, READ BACK BY AND VERIFIED WITH: PHARMD ELIZABETH MARTIN ON 08/13/23 @ 1232 BY DRT    Staphylococcus epidermidis NOT DETECTED NOT DETECTED Final   Staphylococcus lugdunensis NOT DETECTED NOT DETECTED Final   Streptococcus species NOT DETECTED NOT DETECTED Final   Streptococcus agalactiae NOT DETECTED NOT DETECTED Final   Streptococcus pneumoniae NOT DETECTED NOT DETECTED Final   Streptococcus pyogenes NOT DETECTED NOT DETECTED Final   A.calcoaceticus-baumannii NOT DETECTED NOT DETECTED Final   Bacteroides fragilis NOT DETECTED NOT DETECTED Final   Enterobacterales NOT DETECTED NOT DETECTED Final   Enterobacter cloacae complex NOT DETECTED NOT DETECTED Final   Escherichia coli NOT DETECTED NOT DETECTED Final   Klebsiella aerogenes NOT DETECTED NOT DETECTED Final   Klebsiella oxytoca NOT DETECTED NOT DETECTED Final   Klebsiella pneumoniae NOT DETECTED NOT DETECTED Final   Proteus species NOT DETECTED NOT DETECTED Final   Salmonella species NOT DETECTED NOT DETECTED Final   Serratia marcescens NOT DETECTED NOT DETECTED Final   Haemophilus influenzae NOT DETECTED NOT DETECTED Final   Neisseria meningitidis NOT DETECTED NOT DETECTED Final   Pseudomonas aeruginosa NOT DETECTED NOT DETECTED Final   Stenotrophomonas maltophilia NOT DETECTED NOT DETECTED Final   Candida albicans NOT DETECTED NOT DETECTED Final   Candida auris NOT DETECTED NOT DETECTED Final   Candida glabrata NOT DETECTED NOT DETECTED Final   Candida krusei NOT DETECTED NOT DETECTED Final   Candida parapsilosis NOT DETECTED NOT DETECTED Final   Candida tropicalis NOT DETECTED NOT DETECTED Final   Cryptococcus neoformans/gattii NOT DETECTED NOT DETECTED Final   Meth resistant mecA/C and MREJ NOT DETECTED NOT DETECTED Final    Comment: Performed at [facility], [address], [place], [place] [address]  Aerobic/Anaerobic Culture w Gram Stain (surgical/deep wound)      Status: None (Preliminary result)   Collection Time: 08/13/23  3:42 PM   Specimen: Abscess  Result Value Ref Range Status   Specimen Description ABSCESS LEFT KNEE  Final   Special Requests NONE  Final   Gram Stain   Final    ABUNDANT WBC  PRESENT, PREDOMINANTLY PMN RARE GRAM POSITIVE COCCI IN PAIRS    Culture   Final    TOO YOUNG TO READ Performed at [facility], [address] [place], [place] [address]    Report Status PENDING  Incomplete     Medications:    buPROPion   150 mg Oral Daily   docusate sodium   100 mg Oral BID   enoxaparin  (LOVENOX ) injection  40 mg Subcutaneous Q24H   finasteride   5 mg Oral Daily   fluticasone  furoate-vilanterol  1 puff Inhalation Daily   guaiFENesin  600 mg Oral BID   metoprolol  tartrate  25 mg Oral BID   mupirocin ointment  1 Application Nasal BID   pantoprazole   40 mg Oral Daily   polyethylene glycol  17 g Oral BID   polyethylene glycol  17 g Oral Daily   senna-docusate  2 tablet Oral QHS   Continuous Infusions:   ceFAZolin  (ANCEF ) IV 2 g (08/15/23 0120)      LOS: 3 days   Macdonald Savoy  [facility]  08/15/2023, 8:41 AM

## 2023-08-15 NOTE — Progress Notes (Signed)
 Mobility Specialist Progress Note:    08/15/23 1023  Therapy Vitals  Temp (!) 97.5 F (36.4 C)  Temp Source Temporal  Pulse Rate 75  Resp 14  BP (!) 147/79  Oxygen Therapy  SpO2 98 %  O2 Device Room Air  Patient Activity (if Appropriate) In bed  Pulse Oximetry Type Continuous  Mobility  Activity Ambulated with assistance in hallway  Level of Assistance Contact guard assist, steadying assist  Assistive Device Front wheel walker  Distance Ambulated (ft) 45 ft  LLE Weight Bearing Per Provider Order WBAT  Activity Response Tolerated well  Mobility Referral Yes  Mobility visit 1 Mobility  Mobility Specialist Start Time (ACUTE ONLY) [id number]  Mobility Specialist Stop Time (ACUTE ONLY) 1000  Mobility Specialist Time Calculation (min) (ACUTE ONLY) 12 min   Pt received in bed and agreeable. MS assisted w/ donning knee immobilizer. Pt mod I for bed mobility and STS w/ RW. Ambulated in hall w/ CG. C/o some L knee pain towards EOS. Pt left in bed with call bell and all needs met. Bed alarm on.  D'Vante Nolon Baxter Mobility Specialist Please contact via Special educational needs teacher or Rehab office at [phone]

## 2023-08-15 NOTE — H&P (View-Only) (Signed)
 [facility] PROGRESS NOTE    Progress Note  Paul [name].  QMV:[id number] DOB: 1945/11/03 DOA: 08/12/2023 PCP: Genia Kettering, MD     Brief Narrative:   Paul [name]. is an 78 y.o. male past medical history of essential hypertension, hyperlipidemia, seen on February 2025 for bilateral quadricep tendon rupture underwent repair on 04/30/2023 and discharged to inpatient rehab on 05/07/2023, in April 2025 seen by orthopedic surgery underwent revision surgery as an outpatient.  He sustained another injury on 08/08/2023 to the left knee had significant swelling aspiration was performed with improvement in the pain was started on prednisone  comes back on 08/22/2023 draining pus from the left knee area, blood cultures grew MSSA underwent I&D on 08/13/2023 for septic left knee arthritis  Assessment/Plan:   Left knee septic arthritis/MSSA bacteremia: Washout by orthopedic surgery on 08/13/2023. Blood cultures grew MSSA infectious disease was consulted recommended to continue IV cefazolin . 2D echo showed an EF of 60% no wall motion abnormality. TEE to be done on 08/15/2023 to rule out endocarditis Surveillance blood culture on 08/13/2023 have been negative till date  Will need PICC line.  Mild COPD: No evidence of exacerbation continue inhalers.  History of chronic atrial fibrillation does not take any medication: Currently in sinus rhythm. He relates his only takes metoprolol  currently off Eliquis .  Essential hypertension: Continue metoprolol  hold all other antihypertensive medication.  Dyslipidemia: Continue statins.  Depression: Continue Wellbutrin .  Hepatitis C carrier: Noted.  GERD: Continue PPI.  DVT prophylaxis: lovenox  Family Communication:none Status is: Inpatient Remains inpatient appropriate because: Septic knee and MSSA bacteremia    Code Status:     Code Status Orders  (From admission, onward)           Start     Ordered   08/12/23 1656   Full code  Continuous       Question:  By:  Answer:  Consent: discussion documented in EHR   08/12/23 1656           Code Status History     Date Active Date Inactive Code Status Order ID Comments User Context   05/03/2023 1615 05/09/2023 1739 Full Code [id number]  Geradine Kitten Inpatient   05/03/2023 1615 05/03/2023 1615 Full Code [id number]  Geradine Kitten Inpatient   04/28/2023 2243 05/03/2023 1613 Full Code [id number]  Selene Dais, MD ED         IV Access:   Peripheral IV   Procedures and diagnostic studies:   ECHOCARDIOGRAM COMPLETE BUBBLE STUDY Result Date: 08/14/2023    ECHOCARDIOGRAM REPORT   Patient Name:   Paul [name]. Date of Exam: 08/14/2023 Medical Rec #:  [id number]            Height:       72.0 in Accession #:    [id number]           Weight:       178.0 lb Date of Birth:  [birth year]            BSA:          2.028 m Patient Age:    78 years             BP:           115/85 mmHg Patient Gender: M                    HR:  59 bpm. Exam Location:  Inpatient Procedure: 2D Echo, Cardiac Doppler and Color Doppler (Both Spectral and Color            Flow Doppler were utilized during procedure). Indications:    MSSA bacteremia  History:        Patient has prior history of Echocardiogram examinations, most                 recent 05/03/2023. Signs/Symptoms:Syncope; Risk                 Factors:Hypertension.  Sonographer:    Janette Medley Referring Phys: [id number] [facility] M GAWNE IMPRESSIONS  1. Left ventricular ejection fraction, by estimation, is 60 to 65%. The left ventricle has normal function. The left ventricle has no regional wall motion abnormalities. There is mild concentric left ventricular hypertrophy. Left ventricular diastolic parameters were normal.  2. Right ventricular systolic function is normal. The right ventricular size is normal.  3. The mitral valve is normal in structure. Mild mitral valve regurgitation. No evidence of mitral stenosis.   4. The aortic valve is tricuspid. There is mild thickening of the aortic valve. Aortic valve regurgitation is not visualized. Aortic valve sclerosis/calcification is present, without any evidence of aortic stenosis.  5. The inferior vena cava is normal in size with greater than 50% respiratory variability, suggesting right atrial pressure of 3 mmHg.  6. Agitated saline contrast bubble study was negative, with no evidence of any interatrial shunt. Conclusion(s)/Recommendation(s): No evidence of valvular vegetations on this transthoracic echocardiogram. Consider a transesophageal echocardiogram to exclude infective endocarditis if clinically indicated. FINDINGS  Left Ventricle: Left ventricular ejection fraction, by estimation, is 60 to 65%. The left ventricle has normal function. The left ventricle has no regional wall motion abnormalities. The left ventricular internal cavity size was normal in size. There is  mild concentric left ventricular hypertrophy. Left ventricular diastolic parameters were normal. Right Ventricle: The right ventricular size is normal. No increase in right ventricular wall thickness. Right ventricular systolic function is normal. Left Atrium: Left atrial size was normal in size. Right Atrium: Right atrial size was normal in size. Pericardium: There is no evidence of pericardial effusion. Mitral Valve: The mitral valve is normal in structure. Mild mitral valve regurgitation. No evidence of mitral valve stenosis. Tricuspid Valve: The tricuspid valve is normal in structure. Tricuspid valve regurgitation is not demonstrated. No evidence of tricuspid stenosis. Aortic Valve: The aortic valve is tricuspid. There is mild thickening of the aortic valve. Aortic valve regurgitation is not visualized. Aortic valve sclerosis/calcification is present, without any evidence of aortic stenosis. Pulmonic Valve: The pulmonic valve was normal in structure. Pulmonic valve regurgitation is mild. No evidence of  pulmonic stenosis. Aorta: The aortic root is normal in size and structure. Venous: The inferior vena cava is normal in size with greater than 50% respiratory variability, suggesting right atrial pressure of 3 mmHg. IAS/Shunts: No atrial level shunt detected by color flow Doppler. Agitated saline contrast bubble study was negative, with no evidence of any interatrial shunt.  LEFT VENTRICLE PLAX 2D LVIDd:         3.60 cm   Diastology LVIDs:         2.40 cm   LV e' medial:    10.00 cm/s LV PW:         1.50 cm   LV E/e' medial:  9.3 LV IVS:        1.50 cm   LV e' lateral:   10.80 cm/s  LVOT diam:     2.00 cm   LV E/e' lateral: 8.6 LV SV:         83 LV SV Index:   41 LVOT Area:     3.14 cm  RIGHT VENTRICLE             IVC RV S prime:     16.00 cm/s  IVC diam: 2.00 cm TAPSE (M-mode): 2.7 cm LEFT ATRIUM           Index        RIGHT ATRIUM           Index LA diam:      3.70 cm 1.82 cm/m   RA Area:     13.30 cm LA Vol (A2C): 34.9 ml 17.21 ml/m  RA Volume:   30.10 ml  14.85 ml/m LA Vol (A4C): 26.8 ml 13.22 ml/m  AORTIC VALVE LVOT Vmax:   112.00 cm/s LVOT Vmean:  81.200 cm/s LVOT VTI:    0.265 m  AORTA Ao Root diam: 3.60 cm Ao Asc diam:  3.70 cm MITRAL VALVE MV Area (PHT): 3.12 cm    SHUNTS MV Decel Time: 243 msec    Systemic VTI:  0.26 m MV E velocity: 93.30 cm/s  Systemic Diam: 2.00 cm MV A velocity: 79.70 cm/s MV E/A ratio:  1.17 Jules Oar MD Electronically signed by Jules Oar MD Signature Date/Time: 08/14/2023/3:20:01 PM    Final      Medical Consultants:   None.   Subjective:    Paul Daisy Jr. pain controlled with still having diarrhea  Objective:    Vitals:   08/14/23 1446 08/14/23 2000 08/15/23 0443 08/15/23 0814  BP: 129/75 130/65 128/76 (!) 144/72  Pulse: 65 77  73  Resp: 16   16  Temp: 97.7 F (36.5 C) 98.7 F (37.1 C) 98 F (36.7 C) 98.2 F (36.8 C)  TempSrc:  Oral Oral   SpO2: 93%  98% 97%  Weight:      Height:       SpO2: 97 % O2 Flow Rate (L/min): 5  L/min   Intake/Output Summary (Last 24 hours) at 08/15/2023 0841 Last data filed at 08/15/2023 0836 Gross per 24 hour  Intake --  Output 700 ml  Net -700 ml   Filed Weights   08/12/23 1600 08/13/23 1341  Weight: 81 kg 80.7 kg    Exam: General exam: In no acute distress. Respiratory system: Good air movement and clear to auscultation. Cardiovascular system: S1 & S2 heard, RRR. No JVD. Gastrointestinal system: Abdomen is nondistended, soft and nontender Extremities: No pedal edema. Skin: No rashes, lesions or ulcers Psychiatry: Judgement and insight appear normal. Mood & affect appropriate.  Data Reviewed:    Labs: Basic Metabolic Panel: Recent Labs  Lab 08/12/23 1811 08/13/23 1236 08/14/23 0610  NA 135 138 135  K 4.0 4.3 3.8  CL 99 105 105  CO2 24 24 22   GLUCOSE 126* 111* 118*  BUN 33* 29* 26*  CREATININE 0.98 0.88 0.69  CALCIUM  9.5 9.1 8.5*  MG  --  2.0 2.0   GFR Estimated Creatinine Clearance: 82.2 mL/min (by C-G formula based on SCr of 0.69 mg/dL). Liver Function Tests: No results for input(s): "AST", "ALT", "ALKPHOS", "BILITOT", "PROT", "ALBUMIN" in the last 168 hours. No results for input(s): "LIPASE", "AMYLASE" in the last 168 hours. No results for input(s): "AMMONIA" in the last 168 hours. Coagulation profile Recent Labs  Lab 08/12/23 1811  INR 1.0  COVID-19 Labs  Recent Labs    08/12/23 1811  CRP 4.4*    Lab Results  Component Value Date   SARSCOV2NAA NEGATIVE 02/06/2019    CBC: Recent Labs  Lab 08/12/23 1811 08/13/23 1236 08/14/23 0610  WBC 14.4* 14.2* 14.7*  NEUTROABS 12.7*  --  11.9*  HGB 14.7 14.6 13.7  HCT 43.6 44.4 41.4  MCV 93.0 93.5 93.7  PLT 323 306 295   Cardiac Enzymes: No results for input(s): "CKTOTAL", "CKMB", "CKMBINDEX", "TROPONINI" in the last 168 hours. BNP (last 3 results) Recent Labs    05/28/23 1513  PROBNP 90   CBG: No results for input(s): "GLUCAP" in the last 168 hours. D-Dimer: No results for  input(s): "DDIMER" in the last 72 hours. Hgb A1c: No results for input(s): "HGBA1C" in the last 72 hours. Lipid Profile: No results for input(s): "CHOL", "HDL", "LDLCALC", "TRIG", "CHOLHDL", "LDLDIRECT" in the last 72 hours. Thyroid  function studies: No results for input(s): "TSH", "T4TOTAL", "T3FREE", "THYROIDAB" in the last 72 hours.  Invalid input(s): "FREET3" Anemia work up: No results for input(s): "VITAMINB12", "FOLATE", "FERRITIN", "TIBC", "IRON", "RETICCTPCT" in the last 72 hours. Sepsis Labs: Recent Labs  Lab 08/12/23 1811 08/13/23 1236 08/14/23 0610  WBC 14.4* 14.2* 14.7*   Microbiology Recent Results (from the past 240 hours)  Surgical PCR screen     Status: Abnormal   Collection Time: 08/12/23  4:25 PM   Specimen: Nasal Mucosa; Nasal Swab  Result Value Ref Range Status   MRSA, PCR NEGATIVE NEGATIVE Final   Staphylococcus aureus POSITIVE (A) NEGATIVE Final    Comment: (NOTE) The Xpert SA Assay (FDA approved for NASAL specimens in patients 15 years of age and older), is one component of a comprehensive surveillance program. It is not intended to diagnose infection nor to guide or monitor treatment. Performed at [facility], [address] [place], [place] [address]   Aerobic Culture w Gram Stain (superficial specimen)     Status: None (Preliminary result)   Collection Time: 08/12/23  4:58 PM   Specimen: KNEE  Result Value Ref Range Status   Specimen Description KNEE  Final   Special Requests NONE  Final   Gram Stain   Final    FEW WBC PRESENT, PREDOMINANTLY PMN RARE GRAM POSITIVE COCCI IN PAIRS    Culture   Final    FEW STAPHYLOCOCCUS AUREUS SUSCEPTIBILITIES TO FOLLOW Performed at [facility], [address] [place], [place] [address]    Report Status PENDING  Incomplete  Culture, blood (Routine X 2) w Reflex to ID Panel     Status: Abnormal (Preliminary result)   Collection Time: 08/12/23  6:07 PM   Specimen: BLOOD  Result Value Ref  Range Status   Specimen Description BLOOD SITE NOT SPECIFIED  Final   Special Requests   Final    BOTTLES DRAWN AEROBIC AND ANAEROBIC Blood Culture results may not be optimal due to an inadequate volume of blood received in culture bottles   Culture  Setup Time   Final    GRAM POSITIVE COCCI IN CLUSTERS IN BOTH AEROBIC AND ANAEROBIC BOTTLES CRITICAL VALUE NOTED.  VALUE IS CONSISTENT WITH PREVIOUSLY REPORTED AND CALLED VALUE. Performed at [facility], [address] [place], [place] [address]    Culture STAPHYLOCOCCUS AUREUS (A)  Final   Report Status PENDING  Incomplete  Culture, blood (Routine X 2) w Reflex to ID Panel     Status: Abnormal (Preliminary result)   Collection  Time: 08/12/23  6:16 PM   Specimen: BLOOD  Result Value Ref Range Status   Specimen Description BLOOD SITE NOT SPECIFIED  Final   Special Requests   Final    BOTTLES DRAWN AEROBIC AND ANAEROBIC Blood Culture results may not be optimal due to an inadequate volume of blood received in culture bottles   Culture  Setup Time   Final    GRAM POSITIVE COCCI IN CLUSTERS IN BOTH AEROBIC AND ANAEROBIC BOTTLES CRITICAL RESULT CALLED TO, READ BACK BY AND VERIFIED WITH: PHARMD ELIZABETH MARTIN ON 08/13/23 @ 1232 BY DRT    Culture (A)  Final    STAPHYLOCOCCUS AUREUS SUSCEPTIBILITIES TO FOLLOW Performed at [facility], [address] [place], [place] [address]    Report Status PENDING  Incomplete  Blood Culture ID Panel (Reflexed)     Status: Abnormal   Collection Time: 08/12/23  6:16 PM  Result Value Ref Range Status   Enterococcus faecalis NOT DETECTED NOT DETECTED Final   Enterococcus Faecium NOT DETECTED NOT DETECTED Final   Listeria monocytogenes NOT DETECTED NOT DETECTED Final   Staphylococcus species DETECTED (A) NOT DETECTED Final    Comment: CRITICAL RESULT CALLED TO, READ BACK BY AND VERIFIED WITH: PHARMD ELIZABETH MARTIN ON 08/13/23 @ 1232 BY DRT    Staphylococcus aureus (BCID) DETECTED (A) NOT  DETECTED Final    Comment: CRITICAL RESULT CALLED TO, READ BACK BY AND VERIFIED WITH: PHARMD ELIZABETH MARTIN ON 08/13/23 @ 1232 BY DRT    Staphylococcus epidermidis NOT DETECTED NOT DETECTED Final   Staphylococcus lugdunensis NOT DETECTED NOT DETECTED Final   Streptococcus species NOT DETECTED NOT DETECTED Final   Streptococcus agalactiae NOT DETECTED NOT DETECTED Final   Streptococcus pneumoniae NOT DETECTED NOT DETECTED Final   Streptococcus pyogenes NOT DETECTED NOT DETECTED Final   A.calcoaceticus-baumannii NOT DETECTED NOT DETECTED Final   Bacteroides fragilis NOT DETECTED NOT DETECTED Final   Enterobacterales NOT DETECTED NOT DETECTED Final   Enterobacter cloacae complex NOT DETECTED NOT DETECTED Final   Escherichia coli NOT DETECTED NOT DETECTED Final   Klebsiella aerogenes NOT DETECTED NOT DETECTED Final   Klebsiella oxytoca NOT DETECTED NOT DETECTED Final   Klebsiella pneumoniae NOT DETECTED NOT DETECTED Final   Proteus species NOT DETECTED NOT DETECTED Final   Salmonella species NOT DETECTED NOT DETECTED Final   Serratia marcescens NOT DETECTED NOT DETECTED Final   Haemophilus influenzae NOT DETECTED NOT DETECTED Final   Neisseria meningitidis NOT DETECTED NOT DETECTED Final   Pseudomonas aeruginosa NOT DETECTED NOT DETECTED Final   Stenotrophomonas maltophilia NOT DETECTED NOT DETECTED Final   Candida albicans NOT DETECTED NOT DETECTED Final   Candida auris NOT DETECTED NOT DETECTED Final   Candida glabrata NOT DETECTED NOT DETECTED Final   Candida krusei NOT DETECTED NOT DETECTED Final   Candida parapsilosis NOT DETECTED NOT DETECTED Final   Candida tropicalis NOT DETECTED NOT DETECTED Final   Cryptococcus neoformans/gattii NOT DETECTED NOT DETECTED Final   Meth resistant mecA/C and MREJ NOT DETECTED NOT DETECTED Final    Comment: Performed at [facility], [address], [place], [place] [address]  Aerobic/Anaerobic Culture w Gram Stain (surgical/deep wound)      Status: None (Preliminary result)   Collection Time: 08/13/23  3:42 PM   Specimen: Abscess  Result Value Ref Range Status   Specimen Description ABSCESS LEFT KNEE  Final   Special Requests NONE  Final   Gram Stain   Final    ABUNDANT WBC  PRESENT, PREDOMINANTLY PMN RARE GRAM POSITIVE COCCI IN PAIRS    Culture   Final    TOO YOUNG TO READ Performed at [facility], [address] [place], [place] [address]    Report Status PENDING  Incomplete     Medications:    buPROPion   150 mg Oral Daily   docusate sodium   100 mg Oral BID   enoxaparin  (LOVENOX ) injection  40 mg Subcutaneous Q24H   finasteride   5 mg Oral Daily   fluticasone  furoate-vilanterol  1 puff Inhalation Daily   guaiFENesin  600 mg Oral BID   metoprolol  tartrate  25 mg Oral BID   mupirocin ointment  1 Application Nasal BID   pantoprazole   40 mg Oral Daily   polyethylene glycol  17 g Oral BID   polyethylene glycol  17 g Oral Daily   senna-docusate  2 tablet Oral QHS   Continuous Infusions:   ceFAZolin  (ANCEF ) IV 2 g (08/15/23 0120)      LOS: 3 days   Macdonald Savoy  [facility]  08/15/2023, 8:41 AM

## 2023-08-16 ENCOUNTER — Other Ambulatory Visit: Payer: Self-pay

## 2023-08-16 ENCOUNTER — Other Ambulatory Visit (HOSPITAL_COMMUNITY): Payer: Self-pay

## 2023-08-16 DIAGNOSIS — A499 Bacterial infection, unspecified: Secondary | ICD-10-CM

## 2023-08-16 DIAGNOSIS — M00062 Staphylococcal arthritis, left knee: Secondary | ICD-10-CM | POA: Diagnosis not present

## 2023-08-16 DIAGNOSIS — R7881 Bacteremia: Secondary | ICD-10-CM | POA: Diagnosis not present

## 2023-08-16 DIAGNOSIS — M009 Pyogenic arthritis, unspecified: Secondary | ICD-10-CM | POA: Diagnosis not present

## 2023-08-16 DIAGNOSIS — B9561 Methicillin susceptible Staphylococcus aureus infection as the cause of diseases classified elsewhere: Secondary | ICD-10-CM | POA: Diagnosis not present

## 2023-08-16 LAB — GLUCOSE, CAPILLARY: Glucose-Capillary: 123 mg/dL — ABNORMAL HIGH (ref 70–99)

## 2023-08-16 MED ORDER — CHLORHEXIDINE GLUCONATE CLOTH 2 % EX PADS
6.0000 | MEDICATED_PAD | Freq: Every day | CUTANEOUS | Status: DC
  Administered 2023-08-16 – 2023-08-17 (×2): 6 via TOPICAL

## 2023-08-16 MED ORDER — METOPROLOL TARTRATE 25 MG PO TABS: 25.0000 mg | ORAL_TABLET | Freq: Two times a day (BID) | ORAL | Status: DC

## 2023-08-16 MED ORDER — OXYCODONE HCL 10 MG PO TABS
10.0000 mg | ORAL_TABLET | ORAL | 0 refills | Status: DC | PRN
  Filled 2023-08-16: qty 30, 7d supply, fill #0

## 2023-08-16 MED ORDER — CEFAZOLIN IV (FOR PTA / DISCHARGE USE ONLY): 2.0000 g | Freq: Three times a day (TID) | INTRAVENOUS | 0 refills | Status: DC

## 2023-08-16 MED ORDER — SODIUM CHLORIDE 0.9% FLUSH
10.0000 mL | Freq: Two times a day (BID) | INTRAVENOUS | Status: DC
  Administered 2023-08-16 – 2023-08-17 (×2): 10 mL

## 2023-08-16 MED ORDER — SODIUM CHLORIDE 0.9% FLUSH: 10.0000 mL | INTRAVENOUS | Status: DC | PRN

## 2023-08-16 NOTE — Progress Notes (Signed)
 [facility]    Date of Admission:  08/12/2023   Total days of antibiotics 5          ID: Jesse Moritz. is a 78 y.o. male with   Principal Problem:   Septic arthritis (HCC) Active Problems:   Dyslipidemia   Depression   HEPATITIS C CARRIER   GERD (gastroesophageal reflux disease)   Cough   COPD, mild (HCC)   Gram positive bacterial infection   History of atrial fibrillation without current medication   MSSA bacteremia    Subjective: Left knee some pain with ambulation, but manageable  He remains afebrile.   Labs show wbc trending downward  Medications:   buPROPion   150 mg Oral Daily   docusate sodium   100 mg Oral BID   enoxaparin  (LOVENOX ) injection  40 mg Subcutaneous Q24H   finasteride   5 mg Oral Daily   fluticasone  furoate-vilanterol  1 puff Inhalation Daily   guaiFENesin  600 mg Oral BID   metoprolol  tartrate  25 mg Oral BID   mupirocin ointment  1 Application Nasal BID   pantoprazole   40 mg Oral Daily   polyethylene glycol  17 g Oral Daily   senna-docusate  2 tablet Oral QHS    Objective: Vital signs in last 24 hours: Temp:  [98.1 F (36.7 C)-98.4 F (36.9 C)] 98.2 F (36.8 C) (06/06 0818) Pulse Rate:  [62-76] 62 (06/06 0820) Resp:  [16-18] 17 (06/06 0820) BP: (120-137)/(61-72) 137/71 (06/06 0818) SpO2:  [73 %-99 %] 96 % (06/06 0820) Physical Exam  Constitutional: He is oriented to person, place, and time. He appears well-developed and well-nourished. No distress.  HENT:  Mouth/Throat: Oropharynx is clear and moist. No oropharyngeal exudate.  Cardiovascular: Normal rate, regular rhythm and normal heart sounds. Exam reveals no gallop and no friction rub.  No murmur heard.  Pulmonary/Chest: Effort normal and breath sounds normal. No respiratory distress. He has no wheezes.  Ext: left knee is wrapped Neurological: He is alert and oriented to person, place, and time.  Skin: Skin is warm and dry. No rash noted. No  erythema.  Psychiatric: He has a normal mood and affect. His behavior is normal.    Lab Results Recent Labs    08/14/23 0610 08/15/23 1431  WBC 14.7* 11.6*  HGB 13.7 13.7  HCT 41.4 41.5  NA 135  --   K 3.8  --   CL 105  --   CO2 22  --   BUN 26*  --   CREATININE 0.69  --    Lab Results  Component Value Date   ESRSEDRATE 39 (H) 08/12/2023     Microbiology: Blood cx 6/2 MSSA Or cx 6/3: MSSA Staphylococcus aureus      MIC    CIPROFLOXACIN  <=0.5 SENSI... Sensitive    CLINDAMYCIN RESISTANT Resistant    ERYTHROMYCIN >=8 RESISTANT Resistant    GENTAMICIN <=0.5 SENSI... Sensitive    Inducible Clindamycin POSITIVE Resistant    LINEZOLID 2 SENSITIVE Sensitive    OXACILLIN <=0.25 SENS... Sensitive    RIFAMPIN <=0.5 SENSI... Sensitive    TETRACYCLINE <=1 SENSITIVE Sensitive    TRIMETH/SULFA <=10 SENSIT... Sensitive    VANCOMYCIN 1 SENSITIVE Sensitive    Studies/Results: US  EKG SITE RITE Result Date: 08/16/2023 If Site Rite image not attached, placement could not be confirmed due to current cardiac rhythm.  ECHO TEE Result Date: 08/15/2023    TRANSESOPHOGEAL ECHO REPORT   Patient Name:   Reyli Schroth  [employer]. Date of Exam: 08/15/2023 Medical Rec #:  [id number]            Height:       72.0 in Accession #:    [id number]           Weight:       178.0 lb Date of Birth:  29-May-1945            BSA:          2.028 m Patient Age:    78 years             BP:           140/73 mmHg Patient Gender: M                    HR:           70 bpm. Exam Location:  Inpatient Procedure: Transesophageal Echo, Color Doppler, Cardiac Doppler and Saline            Contrast Bubble Study (Both Spectral and Color Flow Doppler were            utilized during procedure). Indications:     Bacteremia and Septic arthritis  History:         Patient has prior history of Echocardiogram examinations, most                  recent 08/14/2023.  Sonographer:     Hersey Lorenzo RDCS Referring Phys:  [phone] HAO MENG Diagnosing Phys:  Olinda Bertrand PROCEDURE: After discussion of the risks and benefits of a TEE, an informed consent was obtained from the patient. The transesophogeal probe was passed without difficulty through the esophogus of the patient. Sedation performed by different physician. Image quality was good. The patient's vital signs; including heart rate, blood pressure, and oxygen saturation; remained stable throughout the procedure. Supplementary images were obtained from transthoracic windows as indicated to answer the clinical question. The patient developed no complications during the procedure.  IMPRESSIONS  1. Left ventricular ejection fraction, by estimation, is 55 to 60%. The left ventricle has normal function. The left ventricle has no regional wall motion abnormalities. Left ventricular diastolic function could not be evaluated.  2. Right ventricular systolic function is normal. The right ventricular size is normal.  3. No left atrial/left atrial appendage thrombus was detected. The LAA emptying velocity was 19 cm/s.  4. The mitral valve is normal in structure. Mild mitral valve regurgitation. No evidence of mitral stenosis.  5. The aortic valve is grossly normal. Aortic valve regurgitation is not visualized. Aortic valve sclerosis is present, with no evidence of aortic valve stenosis.  6. Aortic dilatation noted. There is dilatation of the aortic root, measuring 41 mm. There is mild (Grade II) layered plaque involving the descending aorta.  7. Agitated saline contrast bubble study was negative, with no evidence of any interatrial shunt. Conclusion(s)/Recommendation(s): No evidence of vegetation/infective endocarditis on this transesophageael echocardiogram. FINDINGS  Left Ventricle: Left ventricular ejection fraction, by estimation, is 55 to 60%. The left ventricle has normal function. The left ventricle has no regional wall motion abnormalities. The left ventricular internal cavity size was normal in size. There is  no left  ventricular hypertrophy. Left ventricular diastolic function could not be evaluated due to nondiagnostic images. Left ventricular diastolic function could not be evaluated. Right Ventricle: The right ventricular size is normal. No increase in right ventricular wall thickness. Right ventricular systolic function is normal. Left Atrium: Left  atrial size was normal in size. No left atrial/left atrial appendage thrombus was detected. The LAA emptying velocity was 19 cm/s. Right Atrium: Right atrial size was normal in size. Pericardium: There is no evidence of pericardial effusion. Mitral Valve: The mitral valve is normal in structure. Mild mitral valve regurgitation. No evidence of mitral valve stenosis. Tricuspid Valve: The tricuspid valve is normal in structure. Tricuspid valve regurgitation is not demonstrated. No evidence of tricuspid stenosis. Aortic Valve: The aortic valve is grossly normal. Aortic valve regurgitation is not visualized. Aortic valve sclerosis is present, with no evidence of aortic valve stenosis. Aortic valve mean gradient measures 3.0 mmHg. Aortic valve peak gradient measures 5.2 mmHg. Aortic valve area, by VTI measures 1.87 cm. Pulmonic Valve: The pulmonic valve was normal in structure. Pulmonic valve regurgitation is mild. No evidence of pulmonic stenosis. Aorta: Aortic dilatation noted. There is dilatation of the aortic root, measuring 41 mm. There is mild (Grade II) layered plaque involving the descending aorta. Venous: The left lower pulmonary vein and left upper pulmonary vein are normal. IAS/Shunts: The interatrial septum appears to be lipomatous. The atrial septum is grossly normal. Agitated saline contrast was given intravenously to evaluate for intracardiac shunting. Agitated saline contrast bubble study was negative, with no evidence  of any interatrial shunt. Additional Comments: Spectral Doppler performed. LEFT VENTRICLE PLAX 2D LVOT diam:     2.30 cm LV SV:         44 LV SV Index:    22 LVOT Area:     4.15 cm  AORTIC VALVE AV Area (Vmax):    2.29 cm AV Area (Vmean):   2.02 cm AV Area (VTI):     1.87 cm AV Vmax:           114.00 cm/s AV Vmean:          80.000 cm/s AV VTI:            0.238 m AV Peak Grad:      5.2 mmHg AV Mean Grad:      3.0 mmHg LVOT Vmax:         62.70 cm/s LVOT Vmean:        38.900 cm/s LVOT VTI:          0.107 m LVOT/AV VTI ratio: 0.45  AORTA Ao Root diam: 4.10 cm Ao Asc diam:  3.60 cm  SHUNTS Systemic VTI:  0.11 m Systemic Diam: 2.30 cm Sunit Tolia Electronically signed by Olinda Bertrand Signature Date/Time: 08/15/2023/2:07:28 PM    Final    EP STUDY Result Date: 08/15/2023 See surgical note for result.    Assessment/Plan: MSSA left knee septic arthritis and secondary bacteremia = endocarditis ruled out on TEE yesterday. Has cleared his bacteremia. Plan to get picc line. We will do 4 wk of cefazolin , and likely do 2 additional weeks of oral abtx if inflammatory markers are still elevated.  Leukocytosis = improved  Left knee pain = defer to primary team for apin management regimen  Picc line orders are in as well as coordination of iv abtx  Diagnosis: Septic arthritis  Culture Result: MSSA  Allergies  Allergen Reactions   Other Itching    PATCHES. Patient states that any patch on his skin causes him to itch     OPAT Orders Discharge antibiotics to be given via PICC line Discharge antibiotics: Per pharmacy protocol cefazolin  2gm IV Q 8hr Aim for Vancomycin trough 15-20 or AUC 400-550 (unless otherwise indicated) Duration: 4 wk End Date: July  2nd  [facility] Care Per Protocol:  Home health RN for IV administration and teaching; PICC line care and labs.    Labs weekly while on IV antibiotics: _x_ CBC with differential _x_ BMP _x_ CRP _x_ ESR   x__ Please pull PIC at completion of IV antibiotics  Fax weekly labs to [phone]  Clinic Follow Up Appt: In 4 wk with greg calone  @ [facility]  evaluation of this patient requires complex  antimicrobial therapy evaluation and counseling and isolation needs for disease transmission risk assessment and mitigation.     [facility] Pager: [phone]  08/16/2023, 1:22 PM

## 2023-08-16 NOTE — Progress Notes (Signed)
 [facility] PROGRESS NOTE    Progress Note  Paul [name].  ZDG:[id number] DOB: 05-08-1945 DOA: 08/12/2023 PCP: Genia Kettering, MD     Brief Narrative:   Paul [name]. is an 78 y.o. male past medical history of essential hypertension, hyperlipidemia, seen on February 2025 for bilateral quadricep tendon rupture underwent repair on 04/30/2023 and discharged to inpatient rehab on 05/07/2023, in April 2025 seen by orthopedic surgery underwent revision surgery as an outpatient.  He sustained another injury on 08/08/2023 to the left knee had significant swelling aspiration was performed with improvement in the pain was started on prednisone  comes back on 08/22/2023 draining pus from the left knee area, blood cultures grew MSSA underwent I&D on 08/13/2023 for septic left knee arthritis  Assessment/Plan:   Left knee septic arthritis/MSSA bacteremia: Washout by orthopedic surgery on 08/13/2023. Blood cultures grew MSSA infectious disease was consulted recommended to continue IV cefazolin . 2D echo showed an EF of 60% no wall motion abnormality. TEE done on 08/14/2020 impression of gestation. Surveillance blood culture on 08/13/2023 have been negative till date  PICC line to be inserted in 08/16/2023. 6 to 8 weeks of IV antibiotics.  Mild COPD: No evidence of exacerbation continue inhalers.  History of chronic atrial fibrillation does not take any medication: Currently in sinus rhythm. He relates his only takes metoprolol  currently off Eliquis .  Essential hypertension: Continue metoprolol  hold all other antihypertensive medication.  Dyslipidemia: Continue statins.  Depression: Continue Wellbutrin .  Hepatitis C carrier: Noted.  GERD: Continue PPI.  DVT prophylaxis: lovenox  Family Communication:none Status is: Inpatient Remains inpatient appropriate because: Septic knee and MSSA bacteremia    Code Status:     Code Status Orders  (From admission, onward)            Start     Ordered   08/12/23 1656  Full code  Continuous       Question:  By:  Answer:  Consent: discussion documented in EHR   08/12/23 1656           Code Status History     Date Active Date Inactive Code Status Order ID Comments User Context   05/03/2023 1615 05/09/2023 1739 Full Code [id number]  Geradine Kitten Inpatient   05/03/2023 1615 05/03/2023 1615 Full Code [id number]  Geradine Kitten Inpatient   04/28/2023 2243 05/03/2023 1613 Full Code [id number]  Selene Dais, MD ED         IV Access:   Peripheral IV   Procedures and diagnostic studies:   US  EKG SITE RITE Result Date: 08/16/2023 If Site Rite image not attached, placement could not be confirmed due to current cardiac rhythm.  ECHO TEE Result Date: 08/15/2023    TRANSESOPHOGEAL ECHO REPORT   Patient Name:   Paul [name]. Date of Exam: 08/15/2023 Medical Rec #:  [id number]            Height:       72.0 in Accession #:    [id number]           Weight:       178.0 lb Date of Birth:  28-Nov-1945            BSA:          2.028 m Patient Age:    78 years             BP:           140/73 mmHg Patient Gender: M  HR:           70 bpm. Exam Location:  Inpatient Procedure: Transesophageal Echo, Color Doppler, Cardiac Doppler and Saline            Contrast Bubble Study (Both Spectral and Color Flow Doppler were            utilized during procedure). Indications:     Bacteremia and Septic arthritis  History:         Patient has prior history of Echocardiogram examinations, most                  recent 08/14/2023.  Sonographer:     Hersey Lorenzo RDCS Referring Phys:  [phone] HAO MENG Diagnosing Phys: Olinda Bertrand PROCEDURE: After discussion of the risks and benefits of a TEE, an informed consent was obtained from the patient. The transesophogeal probe was passed without difficulty through the esophogus of the patient. Sedation performed by different physician. Image quality was good. The patient's  vital signs; including heart rate, blood pressure, and oxygen saturation; remained stable throughout the procedure. Supplementary images were obtained from transthoracic windows as indicated to answer the clinical question. The patient developed no complications during the procedure.  IMPRESSIONS  1. Left ventricular ejection fraction, by estimation, is 55 to 60%. The left ventricle has normal function. The left ventricle has no regional wall motion abnormalities. Left ventricular diastolic function could not be evaluated.  2. Right ventricular systolic function is normal. The right ventricular size is normal.  3. No left atrial/left atrial appendage thrombus was detected. The LAA emptying velocity was 19 cm/s.  4. The mitral valve is normal in structure. Mild mitral valve regurgitation. No evidence of mitral stenosis.  5. The aortic valve is grossly normal. Aortic valve regurgitation is not visualized. Aortic valve sclerosis is present, with no evidence of aortic valve stenosis.  6. Aortic dilatation noted. There is dilatation of the aortic root, measuring 41 mm. There is mild (Grade II) layered plaque involving the descending aorta.  7. Agitated saline contrast bubble study was negative, with no evidence of any interatrial shunt. Conclusion(s)/Recommendation(s): No evidence of vegetation/infective endocarditis on this transesophageael echocardiogram. FINDINGS  Left Ventricle: Left ventricular ejection fraction, by estimation, is 55 to 60%. The left ventricle has normal function. The left ventricle has no regional wall motion abnormalities. The left ventricular internal cavity size was normal in size. There is  no left ventricular hypertrophy. Left ventricular diastolic function could not be evaluated due to nondiagnostic images. Left ventricular diastolic function could not be evaluated. Right Ventricle: The right ventricular size is normal. No increase in right ventricular wall thickness. Right ventricular  systolic function is normal. Left Atrium: Left atrial size was normal in size. No left atrial/left atrial appendage thrombus was detected. The LAA emptying velocity was 19 cm/s. Right Atrium: Right atrial size was normal in size. Pericardium: There is no evidence of pericardial effusion. Mitral Valve: The mitral valve is normal in structure. Mild mitral valve regurgitation. No evidence of mitral valve stenosis. Tricuspid Valve: The tricuspid valve is normal in structure. Tricuspid valve regurgitation is not demonstrated. No evidence of tricuspid stenosis. Aortic Valve: The aortic valve is grossly normal. Aortic valve regurgitation is not visualized. Aortic valve sclerosis is present, with no evidence of aortic valve stenosis. Aortic valve mean gradient measures 3.0 mmHg. Aortic valve peak gradient measures 5.2 mmHg. Aortic valve area, by VTI measures 1.87 cm. Pulmonic Valve: The pulmonic valve was normal in structure. Pulmonic valve regurgitation is  mild. No evidence of pulmonic stenosis. Aorta: Aortic dilatation noted. There is dilatation of the aortic root, measuring 41 mm. There is mild (Grade II) layered plaque involving the descending aorta. Venous: The left lower pulmonary vein and left upper pulmonary vein are normal. IAS/Shunts: The interatrial septum appears to be lipomatous. The atrial septum is grossly normal. Agitated saline contrast was given intravenously to evaluate for intracardiac shunting. Agitated saline contrast bubble study was negative, with no evidence  of any interatrial shunt. Additional Comments: Spectral Doppler performed. LEFT VENTRICLE PLAX 2D LVOT diam:     2.30 cm LV SV:         44 LV SV Index:   22 LVOT Area:     4.15 cm  AORTIC VALVE AV Area (Vmax):    2.29 cm AV Area (Vmean):   2.02 cm AV Area (VTI):     1.87 cm AV Vmax:           114.00 cm/s AV Vmean:          80.000 cm/s AV VTI:            0.238 m AV Peak Grad:      5.2 mmHg AV Mean Grad:      3.0 mmHg LVOT Vmax:          62.70 cm/s LVOT Vmean:        38.900 cm/s LVOT VTI:          0.107 m LVOT/AV VTI ratio: 0.45  AORTA Ao Root diam: 4.10 cm Ao Asc diam:  3.60 cm  SHUNTS Systemic VTI:  0.11 m Systemic Diam: 2.30 cm Sunit Tolia Electronically signed by Olinda Bertrand Signature Date/Time: 08/15/2023/2:07:28 PM    Final    EP STUDY Result Date: 08/15/2023 See surgical note for result.  ECHOCARDIOGRAM COMPLETE BUBBLE STUDY Result Date: 08/14/2023    ECHOCARDIOGRAM REPORT   Patient Name:   Zorion Nims. Date of Exam: 08/14/2023 Medical Rec #:  [id number]            Height:       72.0 in Accession #:    [id number]           Weight:       178.0 lb Date of Birth:  01-07-1946            BSA:          2.028 m Patient Age:    78 years             BP:           115/85 mmHg Patient Gender: M                    HR:           59 bpm. Exam Location:  Inpatient Procedure: 2D Echo, Cardiac Doppler and Color Doppler (Both Spectral and Color            Flow Doppler were utilized during procedure). Indications:    MSSA bacteremia  History:        Patient has prior history of Echocardiogram examinations, most                 recent 05/03/2023. Signs/Symptoms:Syncope; Risk                 Factors:Hypertension.  Sonographer:    Janette Medley Referring Phys: [id number] [facility] M GAWNE IMPRESSIONS  1. Left ventricular ejection fraction, by estimation, is 60 to 65%. The  left ventricle has normal function. The left ventricle has no regional wall motion abnormalities. There is mild concentric left ventricular hypertrophy. Left ventricular diastolic parameters were normal.  2. Right ventricular systolic function is normal. The right ventricular size is normal.  3. The mitral valve is normal in structure. Mild mitral valve regurgitation. No evidence of mitral stenosis.  4. The aortic valve is tricuspid. There is mild thickening of the aortic valve. Aortic valve regurgitation is not visualized. Aortic valve sclerosis/calcification is present, without any evidence of  aortic stenosis.  5. The inferior vena cava is normal in size with greater than 50% respiratory variability, suggesting right atrial pressure of 3 mmHg.  6. Agitated saline contrast bubble study was negative, with no evidence of any interatrial shunt. Conclusion(s)/Recommendation(s): No evidence of valvular vegetations on this transthoracic echocardiogram. Consider a transesophageal echocardiogram to exclude infective endocarditis if clinically indicated. FINDINGS  Left Ventricle: Left ventricular ejection fraction, by estimation, is 60 to 65%. The left ventricle has normal function. The left ventricle has no regional wall motion abnormalities. The left ventricular internal cavity size was normal in size. There is  mild concentric left ventricular hypertrophy. Left ventricular diastolic parameters were normal. Right Ventricle: The right ventricular size is normal. No increase in right ventricular wall thickness. Right ventricular systolic function is normal. Left Atrium: Left atrial size was normal in size. Right Atrium: Right atrial size was normal in size. Pericardium: There is no evidence of pericardial effusion. Mitral Valve: The mitral valve is normal in structure. Mild mitral valve regurgitation. No evidence of mitral valve stenosis. Tricuspid Valve: The tricuspid valve is normal in structure. Tricuspid valve regurgitation is not demonstrated. No evidence of tricuspid stenosis. Aortic Valve: The aortic valve is tricuspid. There is mild thickening of the aortic valve. Aortic valve regurgitation is not visualized. Aortic valve sclerosis/calcification is present, without any evidence of aortic stenosis. Pulmonic Valve: The pulmonic valve was normal in structure. Pulmonic valve regurgitation is mild. No evidence of pulmonic stenosis. Aorta: The aortic root is normal in size and structure. Venous: The inferior vena cava is normal in size with greater than 50% respiratory variability, suggesting right atrial  pressure of 3 mmHg. IAS/Shunts: No atrial level shunt detected by color flow Doppler. Agitated saline contrast bubble study was negative, with no evidence of any interatrial shunt.  LEFT VENTRICLE PLAX 2D LVIDd:         3.60 cm   Diastology LVIDs:         2.40 cm   LV e' medial:    10.00 cm/s LV PW:         1.50 cm   LV E/e' medial:  9.3 LV IVS:        1.50 cm   LV e' lateral:   10.80 cm/s LVOT diam:     2.00 cm   LV E/e' lateral: 8.6 LV SV:         83 LV SV Index:   41 LVOT Area:     3.14 cm  RIGHT VENTRICLE             IVC RV S prime:     16.00 cm/s  IVC diam: 2.00 cm TAPSE (M-mode): 2.7 cm LEFT ATRIUM           Index        RIGHT ATRIUM           Index LA diam:      3.70 cm 1.82 cm/m   RA Area:  13.30 cm LA Vol (A2C): 34.9 ml 17.21 ml/m  RA Volume:   30.10 ml  14.85 ml/m LA Vol (A4C): 26.8 ml 13.22 ml/m  AORTIC VALVE LVOT Vmax:   112.00 cm/s LVOT Vmean:  81.200 cm/s LVOT VTI:    0.265 m  AORTA Ao Root diam: 3.60 cm Ao Asc diam:  3.70 cm MITRAL VALVE MV Area (PHT): 3.12 cm    SHUNTS MV Decel Time: 243 msec    Systemic VTI:  0.26 m MV E velocity: 93.30 cm/s  Systemic Diam: 2.00 cm MV A velocity: 79.70 cm/s MV E/A ratio:  1.17 Jules Oar MD Electronically signed by Jules Oar MD Signature Date/Time: 08/14/2023/3:20:01 PM    Final      Medical Consultants:   None.   Subjective:    Paul Ruffini Aldredge Jr. diarrhea resolved pain control.  Objective:    Vitals:   08/15/23 2100 08/16/23 0500 08/16/23 0818 08/16/23 0820  BP: 121/61 133/71 137/71   Pulse: 76 71 65 62  Resp:    17  Temp: 98.1 F (36.7 C) 98.1 F (36.7 C) 98.2 F (36.8 C)   TempSrc: Oral Oral Oral   SpO2: 96% 95% (!) 73% 96%  Weight:      Height:       SpO2: 96 % O2 Flow Rate (L/min): 3 L/min   Intake/Output Summary (Last 24 hours) at 08/16/2023 1004 Last data filed at 08/15/2023 1758 Gross per 24 hour  Intake 200 ml  Output 550 ml  Net -350 ml   Filed Weights   08/12/23 1600 08/13/23 1341  Weight: 81  kg 80.7 kg    Exam: General exam: In no acute distress. Respiratory system: Good air movement and clear to auscultation. Cardiovascular system: S1 & S2 heard, RRR. No JVD. Gastrointestinal system: Abdomen is nondistended, soft and nontender.  Extremities: No pedal edema. Skin: No rashes, lesions or ulcers Psychiatry: Judgement and insight appear normal. Mood & affect appropriate. Data Reviewed:    Labs: Basic Metabolic Panel: Recent Labs  Lab 08/12/23 1811 08/13/23 1236 08/14/23 0610  NA 135 138 135  K 4.0 4.3 3.8  CL 99 105 105  CO2 24 24 22   GLUCOSE 126* 111* 118*  BUN 33* 29* 26*  CREATININE 0.98 0.88 0.69  CALCIUM  9.5 9.1 8.5*  MG  --  2.0 2.0   GFR Estimated Creatinine Clearance: 82.2 mL/min (by C-G formula based on SCr of 0.69 mg/dL). Liver Function Tests: No results for input(s): "AST", "ALT", "ALKPHOS", "BILITOT", "PROT", "ALBUMIN" in the last 168 hours. No results for input(s): "LIPASE", "AMYLASE" in the last 168 hours. No results for input(s): "AMMONIA" in the last 168 hours. Coagulation profile Recent Labs  Lab 08/12/23 1811  INR 1.0   COVID-19 Labs  No results for input(s): "DDIMER", "FERRITIN", "LDH", "CRP" in the last 72 hours.   Lab Results  Component Value Date   SARSCOV2NAA NEGATIVE 02/06/2019    CBC: Recent Labs  Lab 08/12/23 1811 08/13/23 1236 08/14/23 0610 08/15/23 1431  WBC 14.4* 14.2* 14.7* 11.6*  NEUTROABS 12.7*  --  11.9* 8.3*  HGB 14.7 14.6 13.7 13.7  HCT 43.6 44.4 41.4 41.5  MCV 93.0 93.5 93.7 94.1  PLT 323 306 295 283   Cardiac Enzymes: No results for input(s): "CKTOTAL", "CKMB", "CKMBINDEX", "TROPONINI" in the last 168 hours. BNP (last 3 results) Recent Labs    05/28/23 1513  PROBNP 90   CBG: No results for input(s): "GLUCAP" in the last 168 hours. D-Dimer: No  results for input(s): "DDIMER" in the last 72 hours. Hgb A1c: No results for input(s): "HGBA1C" in the last 72 hours. Lipid Profile: No results for  input(s): "CHOL", "HDL", "LDLCALC", "TRIG", "CHOLHDL", "LDLDIRECT" in the last 72 hours. Thyroid  function studies: No results for input(s): "TSH", "T4TOTAL", "T3FREE", "THYROIDAB" in the last 72 hours.  Invalid input(s): "FREET3" Anemia work up: No results for input(s): "VITAMINB12", "FOLATE", "FERRITIN", "TIBC", "IRON", "RETICCTPCT" in the last 72 hours. Sepsis Labs: Recent Labs  Lab 08/12/23 1811 08/13/23 1236 08/14/23 0610 08/15/23 1431  WBC 14.4* 14.2* 14.7* 11.6*   Microbiology Recent Results (from the past 240 hours)  Surgical PCR screen     Status: Abnormal   Collection Time: 08/12/23  4:25 PM   Specimen: Nasal Mucosa; Nasal Swab  Result Value Ref Range Status   MRSA, PCR NEGATIVE NEGATIVE Final   Staphylococcus aureus POSITIVE (A) NEGATIVE Final    Comment: (NOTE) The Xpert SA Assay (FDA approved for NASAL specimens in patients 62 years of age and older), is one component of a comprehensive surveillance program. It is not intended to diagnose infection nor to guide or monitor treatment. Performed at [facility], [address], [place], [place] [address]   Aerobic Culture w Gram Stain (superficial specimen)     Status: None   Collection Time: 08/12/23  4:58 PM   Specimen: KNEE  Result Value Ref Range Status   Specimen Description KNEE  Final   Special Requests NONE  Final   Gram Stain   Final    FEW WBC PRESENT, PREDOMINANTLY PMN RARE GRAM POSITIVE COCCI IN PAIRS Performed at [facility], [address], [place], [place] [address]    Culture FEW STAPHYLOCOCCUS AUREUS  Final   Report Status 08/15/2023 FINAL  Final   Organism ID, Bacteria STAPHYLOCOCCUS AUREUS  Final      Susceptibility   Staphylococcus aureus - MIC*    CIPROFLOXACIN  <=0.5 SENSITIVE Sensitive     ERYTHROMYCIN >=8 RESISTANT Resistant     GENTAMICIN <=0.5 SENSITIVE Sensitive     OXACILLIN 0.5 SENSITIVE Sensitive     TETRACYCLINE <=1 SENSITIVE Sensitive     VANCOMYCIN 1 SENSITIVE  Sensitive     TRIMETH/SULFA <=10 SENSITIVE Sensitive     CLINDAMYCIN RESISTANT Resistant     RIFAMPIN <=0.5 SENSITIVE Sensitive     Inducible Clindamycin POSITIVE Resistant     LINEZOLID 2 SENSITIVE Sensitive     * FEW STAPHYLOCOCCUS AUREUS  Culture, blood (Routine X 2) w Reflex to ID Panel     Status: Abnormal   Collection Time: 08/12/23  6:07 PM   Specimen: BLOOD  Result Value Ref Range Status   Specimen Description BLOOD SITE NOT SPECIFIED  Final   Special Requests   Final    BOTTLES DRAWN AEROBIC AND ANAEROBIC Blood Culture results may not be optimal due to an inadequate volume of blood received in culture bottles   Culture  Setup Time   Final    GRAM POSITIVE COCCI IN CLUSTERS IN BOTH AEROBIC AND ANAEROBIC BOTTLES CRITICAL VALUE NOTED.  VALUE IS CONSISTENT WITH PREVIOUSLY REPORTED AND CALLED VALUE.    Culture (A)  Final    STAPHYLOCOCCUS AUREUS SUSCEPTIBILITIES PERFORMED ON PREVIOUS CULTURE WITHIN THE LAST 5 DAYS. Performed at [facility], [address], [place], [place] [address]    Report Status 08/15/2023 FINAL  Final  Culture, blood (Routine X 2) w Reflex to ID Panel     Status: Abnormal  Collection Time: 08/12/23  6:16 PM   Specimen: BLOOD  Result Value Ref Range Status   Specimen Description BLOOD SITE NOT SPECIFIED  Final   Special Requests   Final    BOTTLES DRAWN AEROBIC AND ANAEROBIC Blood Culture results may not be optimal due to an inadequate volume of blood received in culture bottles   Culture  Setup Time   Final    GRAM POSITIVE COCCI IN CLUSTERS IN BOTH AEROBIC AND ANAEROBIC BOTTLES CRITICAL RESULT CALLED TO, READ BACK BY AND VERIFIED WITH: PHARMD ELIZABETH MARTIN ON 08/13/23 @ 1232 BY DRT Performed at [facility], [address] [place], [place] [address]    Culture STAPHYLOCOCCUS AUREUS (A)  Final   Report Status 08/15/2023 FINAL  Final   Organism ID, Bacteria STAPHYLOCOCCUS AUREUS  Final      Susceptibility   Staphylococcus aureus -  MIC*    CIPROFLOXACIN  <=0.5 SENSITIVE Sensitive     ERYTHROMYCIN >=8 RESISTANT Resistant     GENTAMICIN <=0.5 SENSITIVE Sensitive     OXACILLIN <=0.25 SENSITIVE Sensitive     TETRACYCLINE <=1 SENSITIVE Sensitive     VANCOMYCIN 1 SENSITIVE Sensitive     TRIMETH/SULFA <=10 SENSITIVE Sensitive     CLINDAMYCIN RESISTANT Resistant     RIFAMPIN <=0.5 SENSITIVE Sensitive     Inducible Clindamycin POSITIVE Resistant     LINEZOLID 2 SENSITIVE Sensitive     * STAPHYLOCOCCUS AUREUS  Blood Culture ID Panel (Reflexed)     Status: Abnormal   Collection Time: 08/12/23  6:16 PM  Result Value Ref Range Status   Enterococcus faecalis NOT DETECTED NOT DETECTED Final   Enterococcus Faecium NOT DETECTED NOT DETECTED Final   Listeria monocytogenes NOT DETECTED NOT DETECTED Final   Staphylococcus species DETECTED (A) NOT DETECTED Final    Comment: CRITICAL RESULT CALLED TO, READ BACK BY AND VERIFIED WITH: PHARMD ELIZABETH MARTIN ON 08/13/23 @ 1232 BY DRT    Staphylococcus aureus (BCID) DETECTED (A) NOT DETECTED Final    Comment: CRITICAL RESULT CALLED TO, READ BACK BY AND VERIFIED WITH: PHARMD ELIZABETH MARTIN ON 08/13/23 @ 1232 BY DRT    Staphylococcus epidermidis NOT DETECTED NOT DETECTED Final   Staphylococcus lugdunensis NOT DETECTED NOT DETECTED Final   Streptococcus species NOT DETECTED NOT DETECTED Final   Streptococcus agalactiae NOT DETECTED NOT DETECTED Final   Streptococcus pneumoniae NOT DETECTED NOT DETECTED Final   Streptococcus pyogenes NOT DETECTED NOT DETECTED Final   A.calcoaceticus-baumannii NOT DETECTED NOT DETECTED Final   Bacteroides fragilis NOT DETECTED NOT DETECTED Final   Enterobacterales NOT DETECTED NOT DETECTED Final   Enterobacter cloacae complex NOT DETECTED NOT DETECTED Final   Escherichia coli NOT DETECTED NOT DETECTED Final   Klebsiella aerogenes NOT DETECTED NOT DETECTED Final   Klebsiella oxytoca NOT DETECTED NOT DETECTED Final   Klebsiella pneumoniae NOT DETECTED NOT  DETECTED Final   Proteus species NOT DETECTED NOT DETECTED Final   Salmonella species NOT DETECTED NOT DETECTED Final   Serratia marcescens NOT DETECTED NOT DETECTED Final   Haemophilus influenzae NOT DETECTED NOT DETECTED Final   Neisseria meningitidis NOT DETECTED NOT DETECTED Final   Pseudomonas aeruginosa NOT DETECTED NOT DETECTED Final   Stenotrophomonas maltophilia NOT DETECTED NOT DETECTED Final   Candida albicans NOT DETECTED NOT DETECTED Final   Candida auris NOT DETECTED NOT DETECTED Final   Candida glabrata NOT DETECTED NOT DETECTED Final   Candida krusei NOT DETECTED NOT DETECTED Final   Candida parapsilosis NOT DETECTED NOT DETECTED Final  Candida tropicalis NOT DETECTED NOT DETECTED Final   Cryptococcus neoformans/gattii NOT DETECTED NOT DETECTED Final   Meth resistant mecA/C and MREJ NOT DETECTED NOT DETECTED Final    Comment: Performed at [facility], [address], [place], [place] [address]  Aerobic/Anaerobic Culture w Gram Stain (surgical/deep wound)     Status: None (Preliminary result)   Collection Time: 08/13/23  3:42 PM   Specimen: Abscess  Result Value Ref Range Status   Specimen Description ABSCESS LEFT KNEE  Final   Special Requests NONE  Final   Gram Stain   Final    ABUNDANT WBC PRESENT, PREDOMINANTLY PMN RARE GRAM POSITIVE COCCI IN PAIRS Performed at [facility], [address] [place], [place] [address]    Culture   Final    FEW STAPHYLOCOCCUS AUREUS SUSCEPTIBILITIES TO FOLLOW NO ANAEROBES ISOLATED; CULTURE IN PROGRESS FOR 5 DAYS    Report Status PENDING  Incomplete  Culture, blood (Routine X 2) w Reflex to ID Panel     Status: None (Preliminary result)   Collection Time: 08/14/23  6:10 AM   Specimen: BLOOD RIGHT ARM  Result Value Ref Range Status   Specimen Description BLOOD RIGHT ARM  Final   Special Requests   Final    BOTTLES DRAWN AEROBIC AND ANAEROBIC Blood Culture adequate volume   Culture   Final    NO GROWTH 2  DAYS Performed at [facility], [address] [place], [place] [address]    Report Status PENDING  Incomplete  Culture, blood (Routine X 2) w Reflex to ID Panel     Status: None (Preliminary result)   Collection Time: 08/14/23  6:19 AM   Specimen: BLOOD RIGHT HAND  Result Value Ref Range Status   Specimen Description BLOOD RIGHT HAND  Final   Special Requests   Final    BOTTLES DRAWN AEROBIC AND ANAEROBIC Blood Culture adequate volume   Culture   Final    NO GROWTH 2 DAYS Performed at [facility], [address], [place], [place] [address]    Report Status PENDING  Incomplete     Medications:    buPROPion   150 mg Oral Daily   docusate sodium   100 mg Oral BID   enoxaparin  (LOVENOX ) injection  40 mg Subcutaneous Q24H   finasteride   5 mg Oral Daily   fluticasone  furoate-vilanterol  1 puff Inhalation Daily   guaiFENesin  600 mg Oral BID   metoprolol  tartrate  25 mg Oral BID   mupirocin ointment  1 Application Nasal BID   pantoprazole   40 mg Oral Daily   polyethylene glycol  17 g Oral Daily   senna-docusate  2 tablet Oral QHS   Continuous Infusions:   ceFAZolin  (ANCEF ) IV 2 g (08/16/23 0100)      LOS: 4 days   Macdonald Savoy  [facility]  08/16/2023, 10:04 AM

## 2023-08-16 NOTE — Progress Notes (Signed)
 Peripherally Inserted Central Catheter Placement  The IV Nurse has discussed with the patient and/or persons authorized to consent for the patient, the purpose of this procedure and the potential benefits and risks involved with this procedure.  The benefits include less needle sticks, lab draws from the catheter, and the patient may be discharged home with the catheter. Risks include, but not limited to, infection, bleeding, blood clot (thrombus formation), and puncture of an artery; nerve damage and irregular heartbeat and possibility to perform a PICC exchange if needed/ordered by physician.  Alternatives to this procedure were also discussed.  Bard Power PICC patient education guide, fact sheet on infection prevention and patient information card has been provided to patient /or left at bedside.    PICC Placement Documentation  PICC Single Lumen 08/16/23 Right Basilic 43 cm 0 cm (Active)  Indication for Insertion or Continuance of Line Prolonged intravenous therapies 08/16/23 1511  Exposed Catheter (cm) 0 cm 08/16/23 1511  Site Assessment Clean, Dry, Intact 08/16/23 1511  Line Status Flushed;Saline locked;Blood return noted 08/16/23 1511  Dressing Type Transparent;Securing device 08/16/23 1511  Dressing Status Antimicrobial disc/dressing in place 08/16/23 1511  Line Care Connections checked and tightened 08/16/23 1511  Line Adjustment (NICU/IV Team Only) No 08/16/23 1511  Dressing Intervention New dressing;Adhesive placed at insertion site (IV team only) 08/16/23 1511  Dressing Change Due 08/23/23 08/16/23 1511       Paul [name]  Paul [name] 08/16/2023, 3:12 PM

## 2023-08-16 NOTE — Progress Notes (Signed)
 OT Cancellation Note  Patient Details Name: Paul [name]. MRN: [id number] DOB: 12-Apr-1945   Cancelled Treatment:    Reason Eval/Treat Not Completed: Patient at procedure or test/ unavailable. Pt is in sterile procedure. Will return as able to.  Lariyah Shetterly C, OT  Acute Rehabilitation Services Office [phone] Secure chat preferred   Mickael Alamo 08/16/2023, 2:52 PM

## 2023-08-16 NOTE — Plan of Care (Signed)

## 2023-08-16 NOTE — Progress Notes (Signed)
 Subjective: Patient reports pain as moderate. Medicines helping. Feels much than before surgery.  Tolerating diet.  Urinating.   No CP, SOB. No systemic symptoms. Has continued to mobilize some OOB with PT/OT. PICC line placed today.  Objective:   VITALS:   Vitals:   08/15/23 2100 08/16/23 0500 08/16/23 0818 08/16/23 0820  BP: 121/61 133/71 137/71   Pulse: 76 71 65 62  Resp:    17  Temp: 98.1 F (36.7 C) 98.1 F (36.7 C) 98.2 F (36.8 C)   TempSrc: Oral Oral Oral   SpO2: 96% 95% (!) 73% 96%  Weight:      Height:          Latest Ref Rng & Units 08/15/2023    2:31 PM 08/14/2023    6:10 AM 08/13/2023   12:36 PM  CBC  WBC 4.0 - 10.5 K/uL 11.6  14.7  14.2   Hemoglobin 13.0 - 17.0 g/dL 30.8  65.7  84.6   Hematocrit 39.0 - 52.0 % 41.5  41.4  44.4   Platelets 150 - 400 K/uL 283  295  306       Latest Ref Rng & Units 08/14/2023    6:10 AM 08/13/2023   12:36 PM 08/12/2023    6:11 PM  BMP  Glucose 70 - 99 mg/dL 962  952  841   BUN 8 - 23 mg/dL 26  29  33   Creatinine 0.61 - 1.24 mg/dL 3.24  4.01  0.27   Sodium 135 - 145 mmol/L 135  138  135   Potassium 3.5 - 5.1 mmol/L 3.8  4.3  4.0   Chloride 98 - 111 mmol/L 105  105  99   CO2 22 - 32 mmol/L 22  24  24    Calcium  8.9 - 10.3 mg/dL 8.5  9.1  9.5    Intake/Output      06/05 0701 06/06 0700 06/06 0701 06/07 0700   I.V. (mL/kg) 200 (2.5)    Total Intake(mL/kg) 200 (2.5)    Urine (mL/kg/hr) 650 (0.3)    Total Output 650    Net -450            Physical Exam: General: NAD. Sitting up in bed, calm, comfortable, eating Resp: No increased wob Cardio: regular rate and rhythm ABD soft Neurologically intact MSK Neurovascularly intact Sensation intact distally Intact pulses distally Dorsiflexion/Plantar flexion intact KI not currently on Able to perform SLR   Incision: I removed all the surgical dressings and saw some dried blood on the 4x4s and ABD pad. Mildly TTP globally. Nothing actively draining currently. Nylon sutures  well approximating incision. Moderate knee effusion present. Blanching erythema present to anterior knee and skin edges of incision. Ecchymosis and/or petechial hemorrhage to skin around inferior part of incision medially and laterally.  Assessment: S/P Procedure(s) (LRB): INCISION AND DRAINAGE OF DEEP ABSCESS, KNEE (Left) by Dr. Deloras Fess. Abigail Abler on 08/13/23   Principal Problem:   Septic arthritis (HCC) Active Problems:   Dyslipidemia   Depression   HEPATITIS C CARRIER   GERD (gastroesophageal reflux disease)   Cough   COPD, mild (HCC)   Gram positive bacterial infection   History of atrial fibrillation without current medication   MSSA bacteremia   Plan: Intra-op specimen showing FEW STAPHYLOCOCCUS AUREUS  IV ABX via ID   Advance diet Up with therapy Incentive Spirometry Elevate and Apply ice  Weightbearing: WBAT LLE Insicional and dressing care: Reinforce dressings as needed Orthopedic device(s): KI Showering:  Keep dressing dry VTE prophylaxis: Lovenox  40mg  qd while inpatient, can switch to ASA 81mg  bid x 30 days upon d/c, SCDs, ambulation Pain control: PRN Follow - up plan: 08/19/23 or 08/21/23 Contact information:  Randal Bury MD, Marzella Solan PA-C  Dispo:  I don't love the appearance of his knee once I took all the dressings down. It looks somewhat similar but not quite as bad as pre-op which concerns me. However, it could just be expected swelling and bruising to the skin postop. Area appears very fragile and is sensitive. He is able to fire his quad well with no d/c coming from the wound which is good.    He has his PICC line now, overall looks and feels better, WBCs are trending down. He wants to go home. I think it is ok to let him but I'm guarded about his prognosis. Advised him to aggressively elevate the leg with pillows under the foot to help decrease swelling.   I'd like him to f/u in the office with me or Dr. Abigail Abler next Monday 6/9 or Wednesday 6/11 to  recheck the wound. If we need to take him for another I&D, we would likely do it next Thursday.    Lore Rode, PA-C Office [phone] 08/16/2023, 1:33 PM
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
## 2023-08-16 NOTE — Progress Notes (Signed)
 PHARMACY CONSULT NOTE FOR:  OUTPATIENT  PARENTERAL ANTIBIOTIC THERAPY (OPAT)  Indication: MSSA bacteremia/septic arthritis  Regimen: cefazolin  2g IV q8h End date: 09/11/23  IV antibiotic discharge orders are pended. To discharging provider:  please sign these orders via discharge navigator,  Select New Orders & click on the button choice - Manage This Unsigned Work.     Thank you for allowing pharmacy to be a part of this patient's care.  Sherre Docker 08/16/2023, 1:57 PM
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
## 2023-08-17 ENCOUNTER — Other Ambulatory Visit (HOSPITAL_COMMUNITY): Payer: Self-pay

## 2023-08-17 DIAGNOSIS — M009 Pyogenic arthritis, unspecified: Secondary | ICD-10-CM | POA: Diagnosis not present

## 2023-08-17 DIAGNOSIS — A499 Bacterial infection, unspecified: Secondary | ICD-10-CM | POA: Diagnosis not present

## 2023-08-17 MED ORDER — HEPARIN SOD (PORK) LOCK FLUSH 100 UNIT/ML IV SOLN
250.0000 [IU] | INTRAVENOUS | Status: DC | PRN
  Administered 2023-08-17: 250 [IU]

## 2023-08-17 MED ORDER — HEPARIN SOD (PORK) LOCK FLUSH 100 UNIT/ML IV SOLN: 250.0000 [IU] | INTRAVENOUS | Status: DC | PRN

## 2023-08-17 NOTE — Plan of Care (Signed)

## 2023-08-17 NOTE — Progress Notes (Signed)
 Patent being discharged via private transportation, discharge packet reviewed at bedside, toc meds delivered at bedside, iv access removed.

## 2023-08-17 NOTE — Discharge Summary (Signed)
 Physician Discharge Summary  Jesse Moritz. ZOX:[id number] DOB: [birth year] DOA: 08/12/2023  PCP: Genia Kettering, MD  Admit date: 08/12/2023 Discharge date: 08/17/2023  Admitted From: Home Disposition:  Home  Recommendations for Outpatient Follow-up:  Follow up with [facility] in 1-2 weeks Please obtain BMP/CBC in one week   Home Health:Yes Equipment/Devices:None  Discharge Condition:Stable CODE STATUS:Full Diet recommendation: Heart Healthy   Brief/Interim Summary: 78 y.o. male past medical history of essential hypertension, hyperlipidemia, seen on February 2025 for bilateral quadricep tendon rupture underwent repair on 04/30/2023 and discharged to inpatient rehab on 05/07/2023, in April 2025 seen by orthopedic surgery underwent revision surgery as an outpatient.  He sustained another injury on 08/08/2023 to the left knee had significant swelling aspiration was performed with improvement in the pain was started on prednisone  comes back on 08/22/2023 draining pus from the left knee area, blood cultures grew MSSA underwent I&D on 08/13/2023 for septic left knee arthritis   Discharge Diagnoses:  Principal Problem:   Septic arthritis (HCC) Active Problems:   Dyslipidemia   Depression   HEPATITIS C CARRIER   GERD (gastroesophageal reflux disease)   Cough   COPD, mild (HCC)   Gram positive bacterial infection   History of atrial fibrillation without current medication   MSSA bacteremia  Left knee septic arthritis/MSSA bacteremia: Orthopedic surgery was consulted and he was started on IV empiric antibiotics I&D on 08/13/2023 Blood cultures grew MSSA surveillance on 08/13/2023 blood cultures were repeated which were negative. Infectious ease was consulted 2D echo was done that showed an EF of 60% no wall motion abnormality. TEE done on 08/14/2020 showed no vegetation. PICC line inserted on 08/16/2023. He will go home on IV antibiotics for 6-8 weeks ID to dictate duration of antibiotics,  will follow-up with ID as an outpatient.  Mild COPD exacerbation: Continue inhalers.  History of chronic atrial fibrillation:  currently in sinus rhythm he only takes metoprolol  He relates he does not take Eliquis .  Essential hypertension: No changes made to his medication  Dyslipidemia:  Continue statins.  Depression: Continue Wellbutrin .  Hepatitis C carrier: Limited.    Discharge Instructions  Discharge Instructions     [facility] pharmacist to adjust dose for Vancomycin , Aminoglycosides and other anti-infective therapies as requested by physician.   Complete by: As directed    [facility] to provide Cath Flo 2mg    Complete by: As directed    Administer for PICC line occlusion and as ordered by physician for other access device issues.   Anaphylaxis Kit: Provided to treat any anaphylactic reaction to the medication being provided to the patient if First Dose or when requested by physician   Complete by: As directed    Epinephrine 1mg /ml vial / amp: Administer 0.3mg  (0.55ml) subcutaneously once for moderate to severe anaphylaxis, nurse to call physician and pharmacy when reaction occurs and call 911 if needed for immediate care   Diphenhydramine  50mg /ml IV vial: Administer 25-50mg  IV/IM PRN for first dose reaction, rash, itching, mild reaction, nurse to call physician and pharmacy when reaction occurs   Sodium Chloride  0.9% NS 500ml IV: Administer if needed for hypovolemic blood pressure drop or as ordered by physician after call to physician with anaphylactic reaction   Change dressing on IV access line weekly and PRN   Complete by: As directed    Diet - low sodium heart healthy   Complete by: As directed    Flush IV access with Sodium Chloride  0.9% and Heparin 10 units/ml  or 100 units/ml   Complete by: As directed    Home infusion instructions - [facility]   Complete by: As directed    Instructions: Flush IV access with Sodium  Chloride 0.9% and Heparin 10units/ml or 100units/ml   Change dressing on IV access line: Weekly and PRN   Instructions Cath Flo 2mg : Administer for PICC Line occlusion and as ordered by physician for other access device   [facility] pharmacist to adjust dose for: Vancomycin , Aminoglycosides and other anti-infective therapies as requested by physician   Increase activity slowly   Complete by: As directed    Method of administration may be changed at the discretion of home infusion pharmacist based upon assessment of the patient and/or caregiver's ability to self-administer the medication ordered   Complete by: As directed    No wound care   Complete by: As directed    Outpatient Parenteral Antibiotic Therapy Information Antibiotic: Cefazolin  (Ancef ) IVPB; Indications for use: MSSA bacteremia/septic arthritis; End Date: 09/11/2023   Complete by: As directed    Antibiotic: Cefazolin  (Ancef ) IVPB   Indications for use: MSSA bacteremia/septic arthritis   End Date: 09/11/2023      Allergies as of 08/17/2023       Reactions   Other Itching   PATCHES. Patient states that any patch on his skin causes him to itch         Medication List     STOP taking these medications    clobetasol cream 0.05 % Commonly known as: TEMOVATE   HYDROcodone -acetaminophen  10-325 MG tablet Commonly known as: NORCO   predniSONE  10 MG (21) Tbpk tablet Commonly known as: STERAPRED UNI-PAK 21 TAB   Spacer/Aero-Holding Chambers Devi   traMADol  50 MG tablet Commonly known as: ULTRAM    triamcinolone  ointment 0.5 % Commonly known as: KENALOG        TAKE these medications    acetaminophen  325 MG tablet Commonly known as: TYLENOL  Take 1-2 tablets (325-650 mg total) by mouth every 6 (six) hours as needed for mild pain (pain score 1-3).   atorvastatin  10 MG tablet Commonly known as: LIPITOR Take 10 mg by mouth daily.   budesonide -formoterol  80-4.5 MCG/ACT inhaler Commonly known as:  Symbicort  Inhale 2 puffs into the lungs 2 (two) times daily.   buPROPion  150 MG 24 hr tablet Commonly known as: WELLBUTRIN  XL Take 1 tablet by mouth daily.   ceFAZolin  IVPB Commonly known as: ANCEF  Inject 2 g into the vein every 8 (eight) hours for 26 days. Indication:  MSSA bacteremia and septic arthritis First Dose: Yes Last Day of Therapy:  09/11/23 Labs - Once weekly:  CBC/D and BMP, Labs - Once weekly: ESR and CRP Method of administration: IV Push Method of administration may be changed at the discretion of home infusion pharmacist based upon assessment of the patient and/or caregiver's ability to self-administer the medication ordered.   chlorhexidine  4 % external liquid Commonly known as: HIBICLENS  Apply 15 mLs (1 Application total) topically as directed for 30 doses. Use as directed daily for 5 days every other week for 6 weeks.   cholecalciferol 25 MCG (1000 UNIT) tablet Commonly known as: VITAMIN D3 Take 1,000 Units by mouth daily.   finasteride  5 MG tablet Commonly known as: PROSCAR  Take 1 tablet by mouth daily.   furosemide  20 MG tablet Commonly known as: LASIX  TAKE 1 TO 2 TABLETS BY MOUTH DAILY AS NEEDED What changed: See the new instructions.   magnesium  oxide 400 (240 Mg) MG tablet Commonly  known as: MAG-OX Take 1 tablet (400 mg total) by mouth daily.   metoprolol  tartrate 25 MG tablet Commonly known as: LOPRESSOR  Take 1 tablet (25 mg total) by mouth 2 (two) times daily.   MULTIVITAMIN ADULT PO Take 3 capsules by mouth in the morning.   mupirocin  ointment 2 % Commonly known as: BACTROBAN  Place 1 Application into the nose 2 (two) times daily for 60 doses. Use as directed 2 times daily for 5 days every other week for 6 weeks.   Oxycodone  HCl 10 MG Tabs Take 1 tablet (10 mg total) by mouth every 4 (four) hours as needed for up to 7 days for moderate pain (pain score 4-6).   pantoprazole  40 MG tablet Commonly known as: PROTONIX  Take 1 tablet by mouth  daily. Annual appointment due in October, must see provider for future refills. What changed: See the new instructions.   polyethylene glycol 17 g packet Commonly known as: MIRALAX  / GLYCOLAX  Take 17 g by mouth 2 (two) times daily.   triamterene -hydrochlorothiazide 37.5-25 MG tablet Commonly known as: MAXZIDE-25 Take 1 tablet by mouth daily.   valsartan  160 MG tablet Commonly known as: DIOVAN  TAKE 1 TABLET(160 MG) BY MOUTH DAILY   zolpidem  10 MG tablet Commonly known as: AMBIEN  TAKE 1 TABLET BY MOUTH AT BEDTIME AS NEEDED What changed:  how much to take how to take this when to take this reasons to take this               Discharge Care Instructions  (From admission, onward)           Start     Ordered   08/16/23 0000  Change dressing on IV access line weekly and PRN  (Home infusion instructions - [facility] )        08/16/23 1357            Follow-up Information     [facility] Follow up.   Why: [phone]        Saundra Curl, MD. Schedule an appointment as soon as possible for a visit on 08/19/2023.   Specialty: Orthopedic Surgery Why: Please make an appointment to be seen in our office either Monday 08/19/23 or Wednesday 08/21/23 Contact information: [address] [address] [phone]                Allergies  Allergen Reactions   Other Itching    PATCHES. Patient states that any patch on his skin causes him to itch     Consultations: Infectious disease Orthopedic surgery   Procedures/Studies: US  EKG SITE RITE Result Date: 08/16/2023 If Site Rite image not attached, placement could not be confirmed due to current cardiac rhythm.  ECHO TEE Result Date: 08/15/2023    TRANSESOPHOGEAL ECHO REPORT   Patient Name:   Lajuan Kovaleski. Date of Exam: 08/15/2023 Medical Rec #:  [id number]            Height:       72.0 in Accession #:    [id number]           Weight:       178.0 lb Date of  Birth:  [birth year]            BSA:          2.028 m Patient Age:    78 years             BP:  140/73 mmHg Patient Gender: M                    HR:           70 bpm. Exam Location:  Inpatient Procedure: Transesophageal Echo, Color Doppler, Cardiac Doppler and Saline            Contrast Bubble Study (Both Spectral and Color Flow Doppler were            utilized during procedure). Indications:     Bacteremia and Septic arthritis  History:         Patient has prior history of Echocardiogram examinations, most                  recent 08/14/2023.  Sonographer:     Hersey Lorenzo RDCS Referring Phys:  [id number] HAO MENG Diagnosing Phys: Olinda Bertrand PROCEDURE: After discussion of the risks and benefits of a TEE, an informed consent was obtained from the patient. The transesophogeal probe was passed without difficulty through the esophogus of the patient. Sedation performed by different physician. Image quality was good. The patient's vital signs; including heart rate, blood pressure, and oxygen saturation; remained stable throughout the procedure. Supplementary images were obtained from transthoracic windows as indicated to answer the clinical question. The patient developed no complications during the procedure.  IMPRESSIONS  1. Left ventricular ejection fraction, by estimation, is 55 to 60%. The left ventricle has normal function. The left ventricle has no regional wall motion abnormalities. Left ventricular diastolic function could not be evaluated.  2. Right ventricular systolic function is normal. The right ventricular size is normal.  3. No left atrial/left atrial appendage thrombus was detected. The LAA emptying velocity was 19 cm/s.  4. The mitral valve is normal in structure. Mild mitral valve regurgitation. No evidence of mitral stenosis.  5. The aortic valve is grossly normal. Aortic valve regurgitation is not visualized. Aortic valve sclerosis is present, with no evidence of aortic valve stenosis.  6. Aortic  dilatation noted. There is dilatation of the aortic root, measuring 41 mm. There is mild (Grade II) layered plaque involving the descending aorta.  7. Agitated saline contrast bubble study was negative, with no evidence of any interatrial shunt. Conclusion(s)/Recommendation(s): No evidence of vegetation/infective endocarditis on this transesophageael echocardiogram. FINDINGS  Left Ventricle: Left ventricular ejection fraction, by estimation, is 55 to 60%. The left ventricle has normal function. The left ventricle has no regional wall motion abnormalities. The left ventricular internal cavity size was normal in size. There is  no left ventricular hypertrophy. Left ventricular diastolic function could not be evaluated due to nondiagnostic images. Left ventricular diastolic function could not be evaluated. Right Ventricle: The right ventricular size is normal. No increase in right ventricular wall thickness. Right ventricular systolic function is normal. Left Atrium: Left atrial size was normal in size. No left atrial/left atrial appendage thrombus was detected. The LAA emptying velocity was 19 cm/s. Right Atrium: Right atrial size was normal in size. Pericardium: There is no evidence of pericardial effusion. Mitral Valve: The mitral valve is normal in structure. Mild mitral valve regurgitation. No evidence of mitral valve stenosis. Tricuspid Valve: The tricuspid valve is normal in structure. Tricuspid valve regurgitation is not demonstrated. No evidence of tricuspid stenosis. Aortic Valve: The aortic valve is grossly normal. Aortic valve regurgitation is not visualized. Aortic valve sclerosis is present, with no evidence of aortic valve stenosis. Aortic valve mean gradient measures 3.0 mmHg. Aortic valve peak gradient measures  5.2 mmHg. Aortic valve area, by VTI measures 1.87 cm. Pulmonic Valve: The pulmonic valve was normal in structure. Pulmonic valve regurgitation is mild. No evidence of pulmonic stenosis. Aorta:  Aortic dilatation noted. There is dilatation of the aortic root, measuring 41 mm. There is mild (Grade II) layered plaque involving the descending aorta. Venous: The left lower pulmonary vein and left upper pulmonary vein are normal. IAS/Shunts: The interatrial septum appears to be lipomatous. The atrial septum is grossly normal. Agitated saline contrast was given intravenously to evaluate for intracardiac shunting. Agitated saline contrast bubble study was negative, with no evidence  of any interatrial shunt. Additional Comments: Spectral Doppler performed. LEFT VENTRICLE PLAX 2D LVOT diam:     2.30 cm LV SV:         44 LV SV Index:   22 LVOT Area:     4.15 cm  AORTIC VALVE AV Area (Vmax):    2.29 cm AV Area (Vmean):   2.02 cm AV Area (VTI):     1.87 cm AV Vmax:           114.00 cm/s AV Vmean:          80.000 cm/s AV VTI:            0.238 m AV Peak Grad:      5.2 mmHg AV Mean Grad:      3.0 mmHg LVOT Vmax:         62.70 cm/s LVOT Vmean:        38.900 cm/s LVOT VTI:          0.107 m LVOT/AV VTI ratio: 0.45  AORTA Ao Root diam: 4.10 cm Ao Asc diam:  3.60 cm  SHUNTS Systemic VTI:  0.11 m Systemic Diam: 2.30 cm Sunit Tolia Electronically signed by Olinda Bertrand Signature Date/Time: 08/15/2023/2:07:28 PM    Final    EP STUDY Result Date: 08/15/2023 See surgical note for result.  ECHOCARDIOGRAM COMPLETE BUBBLE STUDY Result Date: 08/14/2023    ECHOCARDIOGRAM REPORT   Patient Name:   Demitri Kucinski. Date of Exam: 08/14/2023 Medical Rec #:  [id number]            Height:       72.0 in Accession #:    [id number]           Weight:       178.0 lb Date of Birth:  Feb 14, 1946            BSA:          2.028 m Patient Age:    78 years             BP:           115/85 mmHg Patient Gender: M                    HR:           59 bpm. Exam Location:  Inpatient Procedure: 2D Echo, Cardiac Doppler and Color Doppler (Both Spectral and Color            Flow Doppler were utilized during procedure). Indications:    MSSA bacteremia   History:        Patient has prior history of Echocardiogram examinations, most                 recent 05/03/2023. Signs/Symptoms:Syncope; Risk                 Factors:Hypertension.  Sonographer:  Janette Medley Referring Phys: [id number] [facility] M GAWNE IMPRESSIONS  1. Left ventricular ejection fraction, by estimation, is 60 to 65%. The left ventricle has normal function. The left ventricle has no regional wall motion abnormalities. There is mild concentric left ventricular hypertrophy. Left ventricular diastolic parameters were normal.  2. Right ventricular systolic function is normal. The right ventricular size is normal.  3. The mitral valve is normal in structure. Mild mitral valve regurgitation. No evidence of mitral stenosis.  4. The aortic valve is tricuspid. There is mild thickening of the aortic valve. Aortic valve regurgitation is not visualized. Aortic valve sclerosis/calcification is present, without any evidence of aortic stenosis.  5. The inferior vena cava is normal in size with greater than 50% respiratory variability, suggesting right atrial pressure of 3 mmHg.  6. Agitated saline contrast bubble study was negative, with no evidence of any interatrial shunt. Conclusion(s)/Recommendation(s): No evidence of valvular vegetations on this transthoracic echocardiogram. Consider a transesophageal echocardiogram to exclude infective endocarditis if clinically indicated. FINDINGS  Left Ventricle: Left ventricular ejection fraction, by estimation, is 60 to 65%. The left ventricle has normal function. The left ventricle has no regional wall motion abnormalities. The left ventricular internal cavity size was normal in size. There is  mild concentric left ventricular hypertrophy. Left ventricular diastolic parameters were normal. Right Ventricle: The right ventricular size is normal. No increase in right ventricular wall thickness. Right ventricular systolic function is normal. Left Atrium: Left atrial size was  normal in size. Right Atrium: Right atrial size was normal in size. Pericardium: There is no evidence of pericardial effusion. Mitral Valve: The mitral valve is normal in structure. Mild mitral valve regurgitation. No evidence of mitral valve stenosis. Tricuspid Valve: The tricuspid valve is normal in structure. Tricuspid valve regurgitation is not demonstrated. No evidence of tricuspid stenosis. Aortic Valve: The aortic valve is tricuspid. There is mild thickening of the aortic valve. Aortic valve regurgitation is not visualized. Aortic valve sclerosis/calcification is present, without any evidence of aortic stenosis. Pulmonic Valve: The pulmonic valve was normal in structure. Pulmonic valve regurgitation is mild. No evidence of pulmonic stenosis. Aorta: The aortic root is normal in size and structure. Venous: The inferior vena cava is normal in size with greater than 50% respiratory variability, suggesting right atrial pressure of 3 mmHg. IAS/Shunts: No atrial level shunt detected by color flow Doppler. Agitated saline contrast bubble study was negative, with no evidence of any interatrial shunt.  LEFT VENTRICLE PLAX 2D LVIDd:         3.60 cm   Diastology LVIDs:         2.40 cm   LV e' medial:    10.00 cm/s LV PW:         1.50 cm   LV E/e' medial:  9.3 LV IVS:        1.50 cm   LV e' lateral:   10.80 cm/s LVOT diam:     2.00 cm   LV E/e' lateral: 8.6 LV SV:         83 LV SV Index:   41 LVOT Area:     3.14 cm  RIGHT VENTRICLE             IVC RV S prime:     16.00 cm/s  IVC diam: 2.00 cm TAPSE (M-mode): 2.7 cm LEFT ATRIUM           Index        RIGHT ATRIUM  Index LA diam:      3.70 cm 1.82 cm/m   RA Area:     13.30 cm LA Vol (A2C): 34.9 ml 17.21 ml/m  RA Volume:   30.10 ml  14.85 ml/m LA Vol (A4C): 26.8 ml 13.22 ml/m  AORTIC VALVE LVOT Vmax:   112.00 cm/s LVOT Vmean:  81.200 cm/s LVOT VTI:    0.265 m  AORTA Ao Root diam: 3.60 cm Ao Asc diam:  3.70 cm MITRAL VALVE MV Area (PHT): 3.12 cm    SHUNTS MV  Decel Time: 243 msec    Systemic VTI:  0.26 m MV E velocity: 93.30 cm/s  Systemic Diam: 2.00 cm MV A velocity: 79.70 cm/s MV E/A ratio:  1.17 Jules Oar MD Electronically signed by Jules Oar MD Signature Date/Time: 08/14/2023/3:20:01 PM    Final    Portable chest 1 View Result Date: 08/12/2023 CLINICAL DATA:  Cough.  Preop exam. EXAM: PORTABLE CHEST 1 VIEW COMPARISON:  Radiograph 12/11/2021, CT 12/22/2021 FINDINGS: The cardiomediastinal contours are normal. Scarring on prior CT is not well seen by radiograph. Pulmonary vasculature is normal. No consolidation, pleural effusion, or pneumothorax. No acute osseous abnormalities are seen. IMPRESSION: No active disease. Electronically Signed   By: Chadwick Colonel M.D.   On: 08/12/2023 20:20   DG Knee Left Port Result Date: 08/12/2023 CLINICAL DATA:  Left knee pain. EXAM: PORTABLE LEFT KNEE - 1-2 VIEW COMPARISON:  06/27/2023, MRI 04/29/2023 FINDINGS: Decreased patellar [place] from prior exam. Normal tibiofemoral alignment. 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 Moderate joint effusion, although diminished from prior. Anterior soft tissue edema persists. No evidence of acute fracture or erosive change. IMPRESSION: 1. Decreased patellar [place] from prior exam. 2. 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 3. Moderate joint effusion, although diminished from prior. Electronically Signed   By: Chadwick Colonel M.D.   On: 08/12/2023 20:19   (Echo, Carotid, EGD, Colonoscopy, ERCP)    Subjective:   Discharge Exam: Vitals:   08/17/23 0420 08/17/23 0802  BP: 121/73 (!) 142/66  Pulse: 68 70  Resp: 15 18  Temp: 98.2 F (36.8 C) 98 F (36.7 C)  SpO2: 97% 99%   Vitals:   08/16/23 2010 08/16/23 2136 08/17/23 0420 08/17/23 0802  BP: 134/75 134/75 121/73 (!) 142/66  Pulse: 81 81 68 70  Resp:  17  15 18   Temp: 98.3 F (36.8 C)  98.2 F (36.8 C) 98 F (36.7 C)  TempSrc:      SpO2: 97%  97% 99%  Weight:      Height:        General: Pt is alert, awake, not in acute distress Cardiovascular: RRR, S1/S2 +, no rubs, no gallops Respiratory: CTA bilaterally, no wheezing, no rhonchi Abdominal: Soft, NT, ND, bowel sounds + Extremities: no edema, no cyanosis    The results of significant diagnostics from this hospitalization (including imaging, microbiology, ancillary and laboratory) are listed below for reference.     Microbiology: Recent Results (from the past 240 hours)  Surgical PCR screen     Status: Abnormal   Collection Time: 08/12/23  4:25 PM   Specimen: Nasal Mucosa; Nasal Swab  Result Value Ref Range Status   MRSA, PCR NEGATIVE NEGATIVE Final   Staphylococcus aureus POSITIVE (A) NEGATIVE Final    Comment: (NOTE) The Xpert SA Assay (FDA approved for NASAL specimens in patients 32 years of age  and older), is one component of a comprehensive surveillance program. It is not intended to diagnose infection nor to guide or monitor treatment. Performed at [facility], [address] [place], [place] [address]   Aerobic Culture w Gram Stain (superficial specimen)     Status: None   Collection Time: 08/12/23  4:58 PM   Specimen: KNEE  Result Value Ref Range Status   Specimen Description KNEE  Final   Special Requests NONE  Final   Gram Stain   Final    FEW WBC PRESENT, PREDOMINANTLY PMN RARE GRAM POSITIVE COCCI IN PAIRS Performed at [facility], [address] [place], [place] [address]    Culture FEW STAPHYLOCOCCUS AUREUS  Final   Report Status 08/15/2023 FINAL  Final   Organism ID, Bacteria STAPHYLOCOCCUS AUREUS  Final      Susceptibility   Staphylococcus aureus - MIC*    CIPROFLOXACIN  <=0.5 SENSITIVE Sensitive     ERYTHROMYCIN >=8 RESISTANT Resistant     GENTAMICIN <=0.5 SENSITIVE Sensitive     OXACILLIN 0.5 SENSITIVE Sensitive      TETRACYCLINE <=1 SENSITIVE Sensitive     VANCOMYCIN  1 SENSITIVE Sensitive     TRIMETH/SULFA <=10 SENSITIVE Sensitive     CLINDAMYCIN RESISTANT Resistant     RIFAMPIN <=0.5 SENSITIVE Sensitive     Inducible Clindamycin POSITIVE Resistant     LINEZOLID 2 SENSITIVE Sensitive     * FEW STAPHYLOCOCCUS AUREUS  Culture, blood (Routine X 2) w Reflex to ID Panel     Status: Abnormal   Collection Time: 08/12/23  6:07 PM   Specimen: BLOOD  Result Value Ref Range Status   Specimen Description BLOOD SITE NOT SPECIFIED  Final   Special Requests   Final    BOTTLES DRAWN AEROBIC AND ANAEROBIC Blood Culture results may not be optimal due to an inadequate volume of blood received in culture bottles   Culture  Setup Time   Final    GRAM POSITIVE COCCI IN CLUSTERS IN BOTH AEROBIC AND ANAEROBIC BOTTLES CRITICAL VALUE NOTED.  VALUE IS CONSISTENT WITH PREVIOUSLY REPORTED AND CALLED VALUE.    Culture (A)  Final    STAPHYLOCOCCUS AUREUS SUSCEPTIBILITIES PERFORMED ON PREVIOUS CULTURE WITHIN THE LAST 5 DAYS. Performed at [facility], [address] [place], [place] [address]    Report Status 08/15/2023 FINAL  Final  Culture, blood (Routine X 2) w Reflex to ID Panel     Status: Abnormal   Collection Time: 08/12/23  6:16 PM   Specimen: BLOOD  Result Value Ref Range Status   Specimen Description BLOOD SITE NOT SPECIFIED  Final   Special Requests   Final    BOTTLES DRAWN AEROBIC AND ANAEROBIC Blood Culture results may not be optimal due to an inadequate volume of blood received in culture bottles   Culture  Setup Time   Final    GRAM POSITIVE COCCI IN CLUSTERS IN BOTH AEROBIC AND ANAEROBIC BOTTLES CRITICAL RESULT CALLED TO, READ BACK BY AND VERIFIED WITH: PHARMD ELIZABETH MARTIN ON 08/13/23 @ 1232 BY DRT Performed at [facility], [address], [place], [place] [address]    Culture STAPHYLOCOCCUS AUREUS (A)  Final   Report Status 08/15/2023 FINAL  Final   Organism ID, Bacteria STAPHYLOCOCCUS  AUREUS  Final      Susceptibility   Staphylococcus aureus - MIC*    CIPROFLOXACIN  <=0.5 SENSITIVE Sensitive     ERYTHROMYCIN >=8 RESISTANT Resistant     GENTAMICIN <=  0.5 SENSITIVE Sensitive     OXACILLIN <=0.25 SENSITIVE Sensitive     TETRACYCLINE <=1 SENSITIVE Sensitive     VANCOMYCIN  1 SENSITIVE Sensitive     TRIMETH/SULFA <=10 SENSITIVE Sensitive     CLINDAMYCIN RESISTANT Resistant     RIFAMPIN <=0.5 SENSITIVE Sensitive     Inducible Clindamycin POSITIVE Resistant     LINEZOLID 2 SENSITIVE Sensitive     * STAPHYLOCOCCUS AUREUS  Blood Culture ID Panel (Reflexed)     Status: Abnormal   Collection Time: 08/12/23  6:16 PM  Result Value Ref Range Status   Enterococcus faecalis NOT DETECTED NOT DETECTED Final   Enterococcus Faecium NOT DETECTED NOT DETECTED Final   Listeria monocytogenes NOT DETECTED NOT DETECTED Final   Staphylococcus species DETECTED (A) NOT DETECTED Final    Comment: CRITICAL RESULT CALLED TO, READ BACK BY AND VERIFIED WITH: PHARMD ELIZABETH MARTIN ON 08/13/23 @ 1232 BY DRT    Staphylococcus aureus (BCID) DETECTED (A) NOT DETECTED Final    Comment: CRITICAL RESULT CALLED TO, READ BACK BY AND VERIFIED WITH: PHARMD ELIZABETH MARTIN ON 08/13/23 @ 1232 BY DRT    Staphylococcus epidermidis NOT DETECTED NOT DETECTED Final   Staphylococcus lugdunensis NOT DETECTED NOT DETECTED Final   Streptococcus species NOT DETECTED NOT DETECTED Final   Streptococcus agalactiae NOT DETECTED NOT DETECTED Final   Streptococcus pneumoniae NOT DETECTED NOT DETECTED Final   Streptococcus pyogenes NOT DETECTED NOT DETECTED Final   A.calcoaceticus-baumannii NOT DETECTED NOT DETECTED Final   Bacteroides fragilis NOT DETECTED NOT DETECTED Final   Enterobacterales NOT DETECTED NOT DETECTED Final   Enterobacter cloacae complex NOT DETECTED NOT DETECTED Final   Escherichia coli NOT DETECTED NOT DETECTED Final   Klebsiella aerogenes NOT DETECTED NOT DETECTED Final   Klebsiella oxytoca NOT  DETECTED NOT DETECTED Final   Klebsiella pneumoniae NOT DETECTED NOT DETECTED Final   Proteus species NOT DETECTED NOT DETECTED Final   Salmonella species NOT DETECTED NOT DETECTED Final   Serratia marcescens NOT DETECTED NOT DETECTED Final   Haemophilus influenzae NOT DETECTED NOT DETECTED Final   Neisseria meningitidis NOT DETECTED NOT DETECTED Final   Pseudomonas aeruginosa NOT DETECTED NOT DETECTED Final   Stenotrophomonas maltophilia NOT DETECTED NOT DETECTED Final   Candida albicans NOT DETECTED NOT DETECTED Final   Candida auris NOT DETECTED NOT DETECTED Final   Candida glabrata NOT DETECTED NOT DETECTED Final   Candida krusei NOT DETECTED NOT DETECTED Final   Candida parapsilosis NOT DETECTED NOT DETECTED Final   Candida tropicalis NOT DETECTED NOT DETECTED Final   Cryptococcus neoformans/gattii NOT DETECTED NOT DETECTED Final   Meth resistant mecA/C and MREJ NOT DETECTED NOT DETECTED Final    Comment: Performed at [facility], [address] [place], [place] [address]  Aerobic/Anaerobic Culture w Gram Stain (surgical/deep wound)     Status: None (Preliminary result)   Collection Time: 08/13/23  3:42 PM   Specimen: Abscess  Result Value Ref Range Status   Specimen Description ABSCESS LEFT KNEE  Final   Special Requests NONE  Final   Gram Stain   Final    ABUNDANT WBC PRESENT, PREDOMINANTLY PMN RARE GRAM POSITIVE COCCI IN PAIRS Performed at [facility], [address] [place], [place] [address]    Culture   Final    FEW STAPHYLOCOCCUS AUREUS NO ANAEROBES ISOLATED; CULTURE IN PROGRESS FOR 5 DAYS    Report Status PENDING  Incomplete   Organism ID, Bacteria STAPHYLOCOCCUS AUREUS  Final  Susceptibility   Staphylococcus aureus - MIC*    CIPROFLOXACIN  <=0.5 SENSITIVE Sensitive     ERYTHROMYCIN >=8 RESISTANT Resistant     GENTAMICIN <=0.5 SENSITIVE Sensitive     OXACILLIN 0.5 SENSITIVE Sensitive     TETRACYCLINE <=1 SENSITIVE Sensitive     VANCOMYCIN  1  SENSITIVE Sensitive     TRIMETH/SULFA <=10 SENSITIVE Sensitive     CLINDAMYCIN RESISTANT Resistant     RIFAMPIN <=0.5 SENSITIVE Sensitive     Inducible Clindamycin POSITIVE Resistant     LINEZOLID 2 SENSITIVE Sensitive     * FEW STAPHYLOCOCCUS AUREUS  Culture, blood (Routine X 2) w Reflex to ID Panel     Status: None (Preliminary result)   Collection Time: 08/14/23  6:10 AM   Specimen: BLOOD RIGHT ARM  Result Value Ref Range Status   Specimen Description BLOOD RIGHT ARM  Final   Special Requests   Final    BOTTLES DRAWN AEROBIC AND ANAEROBIC Blood Culture adequate volume   Culture   Final    NO GROWTH 2 DAYS Performed at [facility], [address], [place], [place] [address]    Report Status PENDING  Incomplete  Culture, blood (Routine X 2) w Reflex to ID Panel     Status: None (Preliminary result)   Collection Time: 08/14/23  6:19 AM   Specimen: BLOOD RIGHT HAND  Result Value Ref Range Status   Specimen Description BLOOD RIGHT HAND  Final   Special Requests   Final    BOTTLES DRAWN AEROBIC AND ANAEROBIC Blood Culture adequate volume   Culture   Final    NO GROWTH 2 DAYS Performed at [facility], [address], [place], [place] [address]    Report Status PENDING  Incomplete     Labs: BNP (last 3 results) No results for input(s): "BNP" in the last 8760 hours. Basic Metabolic Panel: Recent Labs  Lab 08/12/23 1811 08/13/23 1236 08/14/23 0610  NA 135 138 135  K 4.0 4.3 3.8  CL 99 105 105  CO2 24 24 22   GLUCOSE 126* 111* 118*  BUN 33* 29* 26*  CREATININE 0.98 0.88 0.69  CALCIUM  9.5 9.1 8.5*  MG  --  2.0 2.0   Liver Function Tests: No results for input(s): "AST", "ALT", "ALKPHOS", "BILITOT", "PROT", "ALBUMIN" in the last 168 hours. No results for input(s): "LIPASE", "AMYLASE" in the last 168 hours. No results for input(s): "AMMONIA" in the last 168 hours. CBC: Recent Labs  Lab 08/12/23 1811 08/13/23 1236 08/14/23 0610 08/15/23 1431  WBC 14.4*  14.2* 14.7* 11.6*  NEUTROABS 12.7*  --  11.9* 8.3*  HGB 14.7 14.6 13.7 13.7  HCT 43.6 44.4 41.4 41.5  MCV 93.0 93.5 93.7 94.1  PLT 323 306 295 283   Cardiac Enzymes: No results for input(s): "CKTOTAL", "CKMB", "CKMBINDEX", "TROPONINI" in the last 168 hours. BNP: Invalid input(s): "POCBNP" CBG: Recent Labs  Lab 08/16/23 1550  GLUCAP 123*   D-Dimer No results for input(s): "DDIMER" in the last 72 hours. Hgb A1c No results for input(s): "HGBA1C" in the last 72 hours. Lipid Profile No results for input(s): "CHOL", "HDL", "LDLCALC", "TRIG", "CHOLHDL", "LDLDIRECT" in the last 72 hours. Thyroid  function studies No results for input(s): "TSH", "T4TOTAL", "T3FREE", "THYROIDAB" in the last 72 hours.  Invalid input(s): "FREET3" Anemia work up No results for input(s): "VITAMINB12", "FOLATE", "FERRITIN", "TIBC", "IRON", "RETICCTPCT" in the last 72 hours. Urinalysis    Component Value Date/Time   COLORURINE YELLOW 05/31/2022 1013  APPEARANCEUR CLEAR 05/31/2022 1013   LABSPEC 1.020 05/31/2022 1013   PHURINE 6.0 05/31/2022 1013   GLUCOSEU NEGATIVE 05/31/2022 1013   HGBUR NEGATIVE 05/31/2022 1013   BILIRUBINUR NEGATIVE 05/31/2022 1013   KETONESUR NEGATIVE 05/31/2022 1013   PROTEINUR NEGATIVE 03/13/2016 1648   UROBILINOGEN 0.2 05/31/2022 1013   NITRITE NEGATIVE 05/31/2022 1013   LEUKOCYTESUR NEGATIVE 05/31/2022 1013   Sepsis Labs Recent Labs  Lab 08/12/23 1811 08/13/23 1236 08/14/23 0610 08/15/23 1431  WBC 14.4* 14.2* 14.7* 11.6*   Microbiology Recent Results (from the past 240 hours)  Surgical PCR screen     Status: Abnormal   Collection Time: 08/12/23  4:25 PM   Specimen: Nasal Mucosa; Nasal Swab  Result Value Ref Range Status   MRSA, PCR NEGATIVE NEGATIVE Final   Staphylococcus aureus POSITIVE (A) NEGATIVE Final    Comment: (NOTE) The Xpert SA Assay (FDA approved for NASAL specimens in patients 69 years of age and older), is one component of a  comprehensive surveillance program. It is not intended to diagnose infection nor to guide or monitor treatment. Performed at [facility], [address] [place], [place] [address]   Aerobic Culture w Gram Stain (superficial specimen)     Status: None   Collection Time: 08/12/23  4:58 PM   Specimen: KNEE  Result Value Ref Range Status   Specimen Description KNEE  Final   Special Requests NONE  Final   Gram Stain   Final    FEW WBC PRESENT, PREDOMINANTLY PMN RARE GRAM POSITIVE COCCI IN PAIRS Performed at [facility], [address], [place], [place] [address]    Culture FEW STAPHYLOCOCCUS AUREUS  Final   Report Status 08/15/2023 FINAL  Final   Organism ID, Bacteria STAPHYLOCOCCUS AUREUS  Final      Susceptibility   Staphylococcus aureus - MIC*    CIPROFLOXACIN  <=0.5 SENSITIVE Sensitive     ERYTHROMYCIN >=8 RESISTANT Resistant     GENTAMICIN <=0.5 SENSITIVE Sensitive     OXACILLIN 0.5 SENSITIVE Sensitive     TETRACYCLINE <=1 SENSITIVE Sensitive     VANCOMYCIN  1 SENSITIVE Sensitive     TRIMETH/SULFA <=10 SENSITIVE Sensitive     CLINDAMYCIN RESISTANT Resistant     RIFAMPIN <=0.5 SENSITIVE Sensitive     Inducible Clindamycin POSITIVE Resistant     LINEZOLID 2 SENSITIVE Sensitive     * FEW STAPHYLOCOCCUS AUREUS  Culture, blood (Routine X 2) w Reflex to ID Panel     Status: Abnormal   Collection Time: 08/12/23  6:07 PM   Specimen: BLOOD  Result Value Ref Range Status   Specimen Description BLOOD SITE NOT SPECIFIED  Final   Special Requests   Final    BOTTLES DRAWN AEROBIC AND ANAEROBIC Blood Culture results may not be optimal due to an inadequate volume of blood received in culture bottles   Culture  Setup Time   Final    GRAM POSITIVE COCCI IN CLUSTERS IN BOTH AEROBIC AND ANAEROBIC BOTTLES CRITICAL VALUE NOTED.  VALUE IS CONSISTENT WITH PREVIOUSLY REPORTED AND CALLED VALUE.    Culture (A)  Final    STAPHYLOCOCCUS AUREUS SUSCEPTIBILITIES PERFORMED ON PREVIOUS  CULTURE WITHIN THE LAST 5 DAYS. Performed at [facility], [address], [place], [place] [address]    Report Status 08/15/2023 FINAL  Final  Culture, blood (Routine X 2) w Reflex to ID Panel     Status: Abnormal   Collection Time: 08/12/23  6:16 PM   Specimen: BLOOD  Result Value Ref Range Status   Specimen Description BLOOD SITE NOT SPECIFIED  Final   Special Requests   Final    BOTTLES DRAWN AEROBIC AND ANAEROBIC Blood Culture results may not be optimal due to an inadequate volume of blood received in culture bottles   Culture  Setup Time   Final    GRAM POSITIVE COCCI IN CLUSTERS IN BOTH AEROBIC AND ANAEROBIC BOTTLES CRITICAL RESULT CALLED TO, READ BACK BY AND VERIFIED WITH: PHARMD ELIZABETH MARTIN ON 08/13/23 @ 1232 BY DRT Performed at [facility], [address], [place], [place] [address]    Culture STAPHYLOCOCCUS AUREUS (A)  Final   Report Status 08/15/2023 FINAL  Final   Organism ID, Bacteria STAPHYLOCOCCUS AUREUS  Final      Susceptibility   Staphylococcus aureus - MIC*    CIPROFLOXACIN  <=0.5 SENSITIVE Sensitive     ERYTHROMYCIN >=8 RESISTANT Resistant     GENTAMICIN <=0.5 SENSITIVE Sensitive     OXACILLIN <=0.25 SENSITIVE Sensitive     TETRACYCLINE <=1 SENSITIVE Sensitive     VANCOMYCIN  1 SENSITIVE Sensitive     TRIMETH/SULFA <=10 SENSITIVE Sensitive     CLINDAMYCIN RESISTANT Resistant     RIFAMPIN <=0.5 SENSITIVE Sensitive     Inducible Clindamycin POSITIVE Resistant     LINEZOLID 2 SENSITIVE Sensitive     * STAPHYLOCOCCUS AUREUS  Blood Culture ID Panel (Reflexed)     Status: Abnormal   Collection Time: 08/12/23  6:16 PM  Result Value Ref Range Status   Enterococcus faecalis NOT DETECTED NOT DETECTED Final   Enterococcus Faecium NOT DETECTED NOT DETECTED Final   Listeria monocytogenes NOT DETECTED NOT DETECTED Final   Staphylococcus species DETECTED (A) NOT DETECTED Final    Comment: CRITICAL RESULT CALLED TO, READ BACK BY AND VERIFIED WITH: PHARMD  ELIZABETH MARTIN ON 08/13/23 @ 1232 BY DRT    Staphylococcus aureus (BCID) DETECTED (A) NOT DETECTED Final    Comment: CRITICAL RESULT CALLED TO, READ BACK BY AND VERIFIED WITH: PHARMD ELIZABETH MARTIN ON 08/13/23 @ 1232 BY DRT    Staphylococcus epidermidis NOT DETECTED NOT DETECTED Final   Staphylococcus lugdunensis NOT DETECTED NOT DETECTED Final   Streptococcus species NOT DETECTED NOT DETECTED Final   Streptococcus agalactiae NOT DETECTED NOT DETECTED Final   Streptococcus pneumoniae NOT DETECTED NOT DETECTED Final   Streptococcus pyogenes NOT DETECTED NOT DETECTED Final   A.calcoaceticus-baumannii NOT DETECTED NOT DETECTED Final   Bacteroides fragilis NOT DETECTED NOT DETECTED Final   Enterobacterales NOT DETECTED NOT DETECTED Final   Enterobacter cloacae complex NOT DETECTED NOT DETECTED Final   Escherichia coli NOT DETECTED NOT DETECTED Final   Klebsiella aerogenes NOT DETECTED NOT DETECTED Final   Klebsiella oxytoca NOT DETECTED NOT DETECTED Final   Klebsiella pneumoniae NOT DETECTED NOT DETECTED Final   Proteus species NOT DETECTED NOT DETECTED Final   Salmonella species NOT DETECTED NOT DETECTED Final   Serratia marcescens NOT DETECTED NOT DETECTED Final   Haemophilus influenzae NOT DETECTED NOT DETECTED Final   Neisseria meningitidis NOT DETECTED NOT DETECTED Final   Pseudomonas aeruginosa NOT DETECTED NOT DETECTED Final   Stenotrophomonas maltophilia NOT DETECTED NOT DETECTED Final   Candida albicans NOT DETECTED NOT DETECTED Final   Candida auris NOT DETECTED NOT DETECTED Final   Candida glabrata NOT DETECTED NOT DETECTED Final   Candida krusei NOT DETECTED NOT DETECTED Final   Candida parapsilosis NOT DETECTED NOT DETECTED Final   Candida tropicalis NOT DETECTED NOT DETECTED Final   Cryptococcus  neoformans/gattii NOT DETECTED NOT DETECTED Final   Meth resistant mecA/C and MREJ NOT DETECTED NOT DETECTED Final    Comment: Performed at [facility], [address], [place], [place] [address]  Aerobic/Anaerobic Culture w Gram Stain (surgical/deep wound)     Status: None (Preliminary result)   Collection Time: 08/13/23  3:42 PM   Specimen: Abscess  Result Value Ref Range Status   Specimen Description ABSCESS LEFT KNEE  Final   Special Requests NONE  Final   Gram Stain   Final    ABUNDANT WBC PRESENT, PREDOMINANTLY PMN RARE GRAM POSITIVE COCCI IN PAIRS Performed at [facility], [address] [place], [place] [address]    Culture   Final    FEW STAPHYLOCOCCUS AUREUS NO ANAEROBES ISOLATED; CULTURE IN PROGRESS FOR 5 DAYS    Report Status PENDING  Incomplete   Organism ID, Bacteria STAPHYLOCOCCUS AUREUS  Final      Susceptibility   Staphylococcus aureus - MIC*    CIPROFLOXACIN  <=0.5 SENSITIVE Sensitive     ERYTHROMYCIN >=8 RESISTANT Resistant     GENTAMICIN <=0.5 SENSITIVE Sensitive     OXACILLIN 0.5 SENSITIVE Sensitive     TETRACYCLINE <=1 SENSITIVE Sensitive     VANCOMYCIN  1 SENSITIVE Sensitive     TRIMETH/SULFA <=10 SENSITIVE Sensitive     CLINDAMYCIN RESISTANT Resistant     RIFAMPIN <=0.5 SENSITIVE Sensitive     Inducible Clindamycin POSITIVE Resistant     LINEZOLID 2 SENSITIVE Sensitive     * FEW STAPHYLOCOCCUS AUREUS  Culture, blood (Routine X 2) w Reflex to ID Panel     Status: None (Preliminary result)   Collection Time: 08/14/23  6:10 AM   Specimen: BLOOD RIGHT ARM  Result Value Ref Range Status   Specimen Description BLOOD RIGHT ARM  Final   Special Requests   Final    BOTTLES DRAWN AEROBIC AND ANAEROBIC Blood Culture adequate volume   Culture   Final    NO GROWTH 2 DAYS Performed at [facility], [address] [place], [place] [address]    Report Status PENDING  Incomplete  Culture, blood (Routine X 2) w Reflex to ID Panel     Status: None (Preliminary result)   Collection Time: 08/14/23  6:19 AM   Specimen: BLOOD RIGHT HAND  Result Value Ref Range Status   Specimen Description BLOOD RIGHT HAND  Final    Special Requests   Final    BOTTLES DRAWN AEROBIC AND ANAEROBIC Blood Culture adequate volume   Culture   Final    NO GROWTH 2 DAYS Performed at [facility], [address], [place], [place] [address]    Report Status PENDING  Incomplete     Time coordinating discharge: Over 35 minutes  SIGNED:   Macdonald Savoy, MD  [facility] 08/17/2023, 9:36 AM Pager   If 7PM-7AM, please contact night-coverage [email or web address] Password [email or web address]

## 2023-08-17 NOTE — TOC Transition Note (Signed)
 Transition of Care [facility]) - Discharge Note   Patient Details  Name: Paul [name]. MRN: [id number] Date of Birth: 20-Mar-1945  [facility] CM/SW Contact:  Jannine Meo, RN Phone Number: 08/17/2023, 9:55 AM   Clinical Narrative:   Patient has orders for discharge today. Call to [facility] with [employer] to check on status of home IV abx delivery. She will reach out to patient to verify IV abx were delivered yesterday.    Final next level of care: Home w Home Health Services Barriers to Discharge: No Barriers Identified   Patient Goals and CMS Choice Patient states their goals for this hospitalization and ongoing recovery are:: to return to home CMS Medicare.gov Compare Post Acute Care list provided to:: Patient Choice offered to / list presented to : Patient      Discharge Placement                       Discharge Plan and Services Additional resources added to the After Visit Summary for     Discharge Planning Services: CM Consult Post Acute Care Choice:  (Infusion)          DME Arranged: N/A DME Agency: NA         HH Agency: [facility] Date HH Agency Contacted: 08/15/23 Time HH Agency Contacted: 1544 Representative spoke with at [facility] Agency: Pam  Social Drivers of Health (SDOH) Interventions SDOH Screenings   Food Insecurity: No Food Insecurity (08/12/2023)  Housing: Low Risk  (08/12/2023)  Transportation Needs: No Transportation Needs (08/12/2023)  Utilities: Not At Risk (08/12/2023)  Depression (PHQ2-9): Low Risk  (04/16/2023)  Social Connections: Moderately Isolated (08/12/2023)  Tobacco Use: Medium Risk (08/13/2023)     Readmission Risk Interventions    05/01/2023    1:40 PM  Readmission Risk Prevention Plan  Transportation Screening Complete  PCP or Specialist Appt within 5-7 Days Complete  Home Care Screening Complete  Medication Review (RN CM) Complete

## 2023-08-18 DIAGNOSIS — A499 Bacterial infection, unspecified: Secondary | ICD-10-CM | POA: Diagnosis not present

## 2023-08-18 DIAGNOSIS — M009 Pyogenic arthritis, unspecified: Secondary | ICD-10-CM | POA: Diagnosis not present

## 2023-08-18 LAB — AEROBIC/ANAEROBIC CULTURE W GRAM STAIN (SURGICAL/DEEP WOUND)

## 2023-08-19 ENCOUNTER — Other Ambulatory Visit: Payer: Self-pay

## 2023-08-19 ENCOUNTER — Telehealth: Payer: Self-pay

## 2023-08-19 ENCOUNTER — Encounter (HOSPITAL_COMMUNITY)
Admission: AD | Disposition: A | Discharge status: Home Health | Payer: Self-pay | Source: Ambulatory Visit | Attending: Orthopedic Surgery

## 2023-08-19 ENCOUNTER — Inpatient Hospital Stay (HOSPITAL_COMMUNITY)
Admission: AD | Admit: 2023-08-19 | Discharge: 2023-09-05 | DRG: 485 | Disposition: A | Discharge status: Home Health | Source: Ambulatory Visit | Attending: Orthopedic Surgery | Admitting: Orthopedic Surgery

## 2023-08-19 ENCOUNTER — Inpatient Hospital Stay (HOSPITAL_COMMUNITY): Admitting: Anesthesiology

## 2023-08-19 ENCOUNTER — Encounter (HOSPITAL_COMMUNITY): Payer: Self-pay | Admitting: Orthopedic Surgery

## 2023-08-19 DIAGNOSIS — Z800 Family history of malignant neoplasm of digestive organs: Secondary | ICD-10-CM

## 2023-08-19 DIAGNOSIS — Z87442 Personal history of urinary calculi: Secondary | ICD-10-CM

## 2023-08-19 DIAGNOSIS — M25521 Pain in right elbow: Secondary | ICD-10-CM | POA: Diagnosis not present

## 2023-08-19 DIAGNOSIS — Z6824 Body mass index (BMI) 24.0-24.9, adult: Secondary | ICD-10-CM

## 2023-08-19 DIAGNOSIS — E785 Hyperlipidemia, unspecified: Secondary | ICD-10-CM | POA: Diagnosis present

## 2023-08-19 DIAGNOSIS — J449 Chronic obstructive pulmonary disease, unspecified: Secondary | ICD-10-CM | POA: Diagnosis not present

## 2023-08-19 DIAGNOSIS — M00062 Staphylococcal arthritis, left knee: Principal | ICD-10-CM | POA: Diagnosis present

## 2023-08-19 DIAGNOSIS — I480 Paroxysmal atrial fibrillation: Secondary | ICD-10-CM | POA: Diagnosis present

## 2023-08-19 DIAGNOSIS — Z8619 Personal history of other infectious and parasitic diseases: Secondary | ICD-10-CM

## 2023-08-19 DIAGNOSIS — Z820 Family history of epilepsy and other diseases of the nervous system: Secondary | ICD-10-CM

## 2023-08-19 DIAGNOSIS — Z818 Family history of other mental and behavioral disorders: Secondary | ICD-10-CM

## 2023-08-19 DIAGNOSIS — Z981 Arthrodesis status: Secondary | ICD-10-CM

## 2023-08-19 DIAGNOSIS — M25062 Hemarthrosis, left knee: Secondary | ICD-10-CM | POA: Diagnosis not present

## 2023-08-19 DIAGNOSIS — E43 Unspecified severe protein-calorie malnutrition: Secondary | ICD-10-CM | POA: Diagnosis present

## 2023-08-19 DIAGNOSIS — A499 Bacterial infection, unspecified: Secondary | ICD-10-CM | POA: Diagnosis not present

## 2023-08-19 DIAGNOSIS — Z83719 Family history of colon polyps, unspecified: Secondary | ICD-10-CM

## 2023-08-19 DIAGNOSIS — Z79899 Other long term (current) drug therapy: Secondary | ICD-10-CM

## 2023-08-19 DIAGNOSIS — F32A Depression, unspecified: Secondary | ICD-10-CM | POA: Diagnosis present

## 2023-08-19 DIAGNOSIS — I82A11 Acute embolism and thrombosis of right axillary vein: Secondary | ICD-10-CM | POA: Diagnosis present

## 2023-08-19 DIAGNOSIS — I2510 Atherosclerotic heart disease of native coronary artery without angina pectoris: Secondary | ICD-10-CM | POA: Diagnosis present

## 2023-08-19 DIAGNOSIS — M00862 Arthritis due to other bacteria, left knee: Secondary | ICD-10-CM | POA: Diagnosis not present

## 2023-08-19 DIAGNOSIS — F17210 Nicotine dependence, cigarettes, uncomplicated: Secondary | ICD-10-CM | POA: Diagnosis not present

## 2023-08-19 DIAGNOSIS — Z7951 Long term (current) use of inhaled steroids: Secondary | ICD-10-CM

## 2023-08-19 DIAGNOSIS — N400 Enlarged prostate without lower urinary tract symptoms: Secondary | ICD-10-CM | POA: Diagnosis present

## 2023-08-19 DIAGNOSIS — M25462 Effusion, left knee: Secondary | ICD-10-CM | POA: Diagnosis not present

## 2023-08-19 DIAGNOSIS — Z823 Family history of stroke: Secondary | ICD-10-CM | POA: Diagnosis not present

## 2023-08-19 DIAGNOSIS — M009 Pyogenic arthritis, unspecified: Principal | ICD-10-CM | POA: Diagnosis present

## 2023-08-19 DIAGNOSIS — Z86718 Personal history of other venous thrombosis and embolism: Secondary | ICD-10-CM | POA: Diagnosis present

## 2023-08-19 DIAGNOSIS — K219 Gastro-esophageal reflux disease without esophagitis: Secondary | ICD-10-CM | POA: Diagnosis present

## 2023-08-19 DIAGNOSIS — M01X62 Direct infection of left knee in infectious and parasitic diseases classified elsewhere: Secondary | ICD-10-CM | POA: Diagnosis not present

## 2023-08-19 DIAGNOSIS — Z7901 Long term (current) use of anticoagulants: Secondary | ICD-10-CM | POA: Diagnosis not present

## 2023-08-19 DIAGNOSIS — F419 Anxiety disorder, unspecified: Secondary | ICD-10-CM | POA: Diagnosis present

## 2023-08-19 DIAGNOSIS — Z9842 Cataract extraction status, left eye: Secondary | ICD-10-CM

## 2023-08-19 DIAGNOSIS — I10 Essential (primary) hypertension: Secondary | ICD-10-CM | POA: Diagnosis present

## 2023-08-19 DIAGNOSIS — Z860101 Personal history of adenomatous and serrated colon polyps: Secondary | ICD-10-CM

## 2023-08-19 DIAGNOSIS — F909 Attention-deficit hyperactivity disorder, unspecified type: Secondary | ICD-10-CM | POA: Diagnosis present

## 2023-08-19 DIAGNOSIS — R7881 Bacteremia: Secondary | ICD-10-CM | POA: Diagnosis present

## 2023-08-19 DIAGNOSIS — S76102D Unspecified injury of left quadriceps muscle, fascia and tendon, subsequent encounter: Secondary | ICD-10-CM | POA: Diagnosis not present

## 2023-08-19 DIAGNOSIS — M7989 Other specified soft tissue disorders: Secondary | ICD-10-CM | POA: Diagnosis not present

## 2023-08-19 DIAGNOSIS — I82612 Acute embolism and thrombosis of superficial veins of left upper extremity: Secondary | ICD-10-CM | POA: Diagnosis not present

## 2023-08-19 DIAGNOSIS — Z8249 Family history of ischemic heart disease and other diseases of the circulatory system: Secondary | ICD-10-CM

## 2023-08-19 DIAGNOSIS — Z9841 Cataract extraction status, right eye: Secondary | ICD-10-CM

## 2023-08-19 DIAGNOSIS — B9561 Methicillin susceptible Staphylococcus aureus infection as the cause of diseases classified elsewhere: Secondary | ICD-10-CM | POA: Diagnosis not present

## 2023-08-19 DIAGNOSIS — I82409 Acute embolism and thrombosis of unspecified deep veins of unspecified lower extremity: Secondary | ICD-10-CM | POA: Diagnosis present

## 2023-08-19 HISTORY — PX: IRRIGATION AND DEBRIDEMENT KNEE: SHX5185

## 2023-08-19 LAB — CULTURE, BLOOD (ROUTINE X 2)
Culture: NO GROWTH
Culture: NO GROWTH
Special Requests: ADEQUATE
Special Requests: ADEQUATE

## 2023-08-19 LAB — CBC
HCT: 39.9 % (ref 39.0–52.0)
Hemoglobin: 13.4 g/dL (ref 13.0–17.0)
MCH: 31.3 pg (ref 26.0–34.0)
MCHC: 33.6 g/dL (ref 30.0–36.0)
MCV: 93.2 fL (ref 80.0–100.0)
Platelets: 309 10*3/uL (ref 150–400)
RBC: 4.28 MIL/uL (ref 4.22–5.81)
RDW: 12.7 % (ref 11.5–15.5)
WBC: 10.8 10*3/uL — ABNORMAL HIGH (ref 4.0–10.5)
nRBC: 0 % (ref 0.0–0.2)

## 2023-08-19 LAB — CREATININE, SERUM
Creatinine, Ser: 0.79 mg/dL (ref 0.61–1.24)
GFR, Estimated: >60 mL/min (ref >60)

## 2023-08-19 SURGERY — IRRIGATION AND DEBRIDEMENT KNEE
Anesthesia: General | Site: Knee | Laterality: Left

## 2023-08-19 MED ORDER — VANCOMYCIN HCL 1000 MG IV SOLR
INTRAVENOUS | Status: DC | PRN
  Administered 2023-08-19: 1000 mg via TOPICAL

## 2023-08-19 MED ORDER — ATORVASTATIN CALCIUM 10 MG PO TABS
10.0000 mg | ORAL_TABLET | Freq: Every day | ORAL | Status: DC
  Administered 2023-08-19 – 2023-09-05 (×16): 10 mg via ORAL
  Filled 2023-08-19 (×17): qty 1

## 2023-08-19 MED ORDER — OXYCODONE HCL ER 10 MG PO T12A
10.0000 mg | EXTENDED_RELEASE_TABLET | Freq: Two times a day (BID) | ORAL | Status: DC
  Administered 2023-08-19 – 2023-09-05 (×28): 10 mg via ORAL
  Filled 2023-08-19 (×32): qty 1

## 2023-08-19 MED ORDER — CEFAZOLIN SODIUM-DEXTROSE 2-3 GM-%(50ML) IV SOLR
INTRAVENOUS | Status: DC | PRN
Start: 2023-08-19 — End: 2023-08-19
  Administered 2023-08-19: 2 g via INTRAVENOUS

## 2023-08-19 MED ORDER — POLYETHYLENE GLYCOL 3350 17 G PO PACK
17.0000 g | PACK | Freq: Every day | ORAL | Status: DC | PRN
  Administered 2023-08-25: 17 g via ORAL
  Filled 2023-08-19: qty 1

## 2023-08-19 MED ORDER — PROPOFOL 10 MG/ML IV BOLUS
INTRAVENOUS | Status: AC
  Filled 2023-08-19: qty 20

## 2023-08-19 MED ORDER — DOCUSATE SODIUM 100 MG PO CAPS
100.0000 mg | ORAL_CAPSULE | Freq: Two times a day (BID) | ORAL | Status: DC
  Administered 2023-08-19 – 2023-08-30 (×19): 100 mg via ORAL
  Filled 2023-08-19 (×24): qty 1

## 2023-08-19 MED ORDER — DIPHENHYDRAMINE HCL 12.5 MG/5ML PO ELIX: 12.5000 mg | ORAL_SOLUTION | ORAL | Status: DC | PRN

## 2023-08-19 MED ORDER — FLUTICASONE FUROATE-VILANTEROL 100-25 MCG/ACT IN AEPB
1.0000 | INHALATION_SPRAY | Freq: Every day | RESPIRATORY_TRACT | Status: DC
  Administered 2023-08-20 – 2023-09-05 (×14): 1 via RESPIRATORY_TRACT
  Filled 2023-08-19: qty 28

## 2023-08-19 MED ORDER — SODIUM CHLORIDE 0.9 % IV SOLN: 12.5000 mg | INTRAVENOUS | Status: DC | PRN

## 2023-08-19 MED ORDER — ACETAMINOPHEN 500 MG PO TABS
1000.0000 mg | ORAL_TABLET | Freq: Four times a day (QID) | ORAL | Status: AC
  Administered 2023-08-20 (×3): 1000 mg via ORAL
  Filled 2023-08-19 (×3): qty 2

## 2023-08-19 MED ORDER — BISACODYL 10 MG RE SUPP: 10.0000 mg | Freq: Every day | RECTAL | Status: DC | PRN

## 2023-08-19 MED ORDER — AMISULPRIDE (ANTIEMETIC) 5 MG/2ML IV SOLN: 10.0000 mg | Freq: Once | INTRAVENOUS | Status: DC | PRN

## 2023-08-19 MED ORDER — ONDANSETRON HCL 4 MG PO TABS: 4.0000 mg | ORAL_TABLET | Freq: Four times a day (QID) | ORAL | Status: DC | PRN

## 2023-08-19 MED ORDER — METHOCARBAMOL 1000 MG/10ML IJ SOLN: 750.0000 mg | Freq: Four times a day (QID) | INTRAMUSCULAR | Status: DC | PRN

## 2023-08-19 MED ORDER — ONDANSETRON HCL 4 MG/2ML IJ SOLN: 4.0000 mg | Freq: Four times a day (QID) | INTRAMUSCULAR | Status: DC | PRN

## 2023-08-19 MED ORDER — HYDROMORPHONE HCL 1 MG/ML IJ SOLN
0.2500 mg | INTRAMUSCULAR | Status: DC | PRN
  Administered 2023-08-19 (×4): 0.5 mg via INTRAVENOUS

## 2023-08-19 MED ORDER — CHLORHEXIDINE GLUCONATE CLOTH 2 % EX PADS
6.0000 | MEDICATED_PAD | Freq: Every day | CUTANEOUS | Status: DC
  Administered 2023-08-20 – 2023-09-05 (×12): 6 via TOPICAL

## 2023-08-19 MED ORDER — CEFAZOLIN SODIUM 1 G IJ SOLR
INTRAMUSCULAR | Status: AC
  Filled 2023-08-19: qty 20

## 2023-08-19 MED ORDER — ZOLPIDEM TARTRATE 5 MG PO TABS
10.0000 mg | ORAL_TABLET | Freq: Every evening | ORAL | Status: DC | PRN
  Administered 2023-08-30: 10 mg via ORAL
  Filled 2023-08-19: qty 2

## 2023-08-19 MED ORDER — OXYCODONE HCL 5 MG/5ML PO SOLN: 5.0000 mg | Freq: Once | ORAL | Status: DC | PRN

## 2023-08-19 MED ORDER — HYDROMORPHONE HCL 1 MG/ML IJ SOLN
INTRAMUSCULAR | Status: AC
  Filled 2023-08-19: qty 1

## 2023-08-19 MED ORDER — ACETAMINOPHEN 325 MG PO TABS
325.0000 mg | ORAL_TABLET | Freq: Four times a day (QID) | ORAL | Status: DC | PRN
  Administered 2023-08-24 – 2023-09-05 (×10): 650 mg via ORAL
  Filled 2023-08-19 (×12): qty 2

## 2023-08-19 MED ORDER — POVIDONE-IODINE 10 % EX SWAB: 2.0000 | Freq: Once | CUTANEOUS | Status: DC

## 2023-08-19 MED ORDER — PROPOFOL 10 MG/ML IV BOLUS
INTRAVENOUS | Status: DC | PRN
  Administered 2023-08-19: 100 mg via INTRAVENOUS
  Administered 2023-08-19: 200 mg via INTRAVENOUS

## 2023-08-19 MED ORDER — CEFAZOLIN SODIUM-DEXTROSE 2-4 GM/100ML-% IV SOLN
2.0000 g | Freq: Three times a day (TID) | INTRAVENOUS | Status: DC
  Administered 2023-08-19 – 2023-09-05 (×47): 2 g via INTRAVENOUS
  Filled 2023-08-19 (×51): qty 100

## 2023-08-19 MED ORDER — MAGNESIUM CITRATE PO SOLN: 1.0000 | Freq: Once | ORAL | Status: DC | PRN

## 2023-08-19 MED ORDER — TRIAMTERENE-HCTZ 37.5-25 MG PO TABS
1.0000 | ORAL_TABLET | Freq: Every day | ORAL | Status: DC
  Administered 2023-08-20 – 2023-09-05 (×15): 1 via ORAL
  Filled 2023-08-19 (×16): qty 1

## 2023-08-19 MED ORDER — CHLORHEXIDINE GLUCONATE 0.12 % MT SOLN
15.0000 mL | Freq: Once | OROMUCOSAL | Status: AC
  Administered 2023-08-19: 15 mL via OROMUCOSAL

## 2023-08-19 MED ORDER — HYDROMORPHONE HCL 2 MG PO TABS
2.0000 mg | ORAL_TABLET | ORAL | Status: DC | PRN
  Administered 2023-08-21 (×2): 2 mg via ORAL
  Administered 2023-08-22: 3 mg via ORAL
  Administered 2023-08-22 – 2023-08-30 (×14): 2 mg via ORAL
  Filled 2023-08-19 (×7): qty 1
  Filled 2023-08-19: qty 2
  Filled 2023-08-19 (×20): qty 1

## 2023-08-19 MED ORDER — DEXAMETHASONE SODIUM PHOSPHATE 4 MG/ML IJ SOLN
INTRAMUSCULAR | Status: DC | PRN
Start: 2023-08-19 — End: 2023-08-19
  Administered 2023-08-19: 5 mg via INTRAVENOUS

## 2023-08-19 MED ORDER — SODIUM CHLORIDE 0.9% FLUSH: 10.0000 mL | INTRAVENOUS | Status: DC | PRN

## 2023-08-19 MED ORDER — PANTOPRAZOLE SODIUM 40 MG PO TBEC
40.0000 mg | DELAYED_RELEASE_TABLET | Freq: Every day | ORAL | Status: DC
  Administered 2023-08-19 – 2023-09-05 (×16): 40 mg via ORAL
  Filled 2023-08-19 (×17): qty 1

## 2023-08-19 MED ORDER — METOPROLOL TARTRATE 25 MG PO TABS
25.0000 mg | ORAL_TABLET | Freq: Two times a day (BID) | ORAL | Status: DC
  Administered 2023-08-19 – 2023-09-05 (×31): 25 mg via ORAL
  Filled 2023-08-19 (×34): qty 1

## 2023-08-19 MED ORDER — MUPIROCIN 2 % EX OINT
1.0000 | TOPICAL_OINTMENT | Freq: Two times a day (BID) | CUTANEOUS | Status: DC
  Administered 2023-08-19 – 2023-09-05 (×29): 1 via NASAL
  Filled 2023-08-19 (×3): qty 22

## 2023-08-19 MED ORDER — METHOCARBAMOL 500 MG PO TABS
750.0000 mg | ORAL_TABLET | Freq: Four times a day (QID) | ORAL | Status: DC | PRN
  Administered 2023-08-19 – 2023-09-05 (×32): 750 mg via ORAL
  Filled 2023-08-19 (×36): qty 2

## 2023-08-19 MED ORDER — IRBESARTAN 150 MG PO TABS
150.0000 mg | ORAL_TABLET | Freq: Every day | ORAL | Status: DC
  Administered 2023-08-19 – 2023-09-05 (×16): 150 mg via ORAL
  Filled 2023-08-19 (×17): qty 1

## 2023-08-19 MED ORDER — ENOXAPARIN SODIUM 40 MG/0.4ML IJ SOSY
40.0000 mg | PREFILLED_SYRINGE | INTRAMUSCULAR | Status: DC
  Administered 2023-08-20 – 2023-08-22 (×3): 40 mg via SUBCUTANEOUS
  Filled 2023-08-19 (×3): qty 0.4

## 2023-08-19 MED ORDER — METOCLOPRAMIDE HCL 5 MG PO TABS: 5.0000 mg | ORAL_TABLET | Freq: Three times a day (TID) | ORAL | Status: DC | PRN

## 2023-08-19 MED ORDER — HYDROMORPHONE HCL 2 MG/ML IJ SOLN
INTRAMUSCULAR | Status: AC
  Filled 2023-08-19: qty 1

## 2023-08-19 MED ORDER — CEFAZOLIN IV (FOR PTA / DISCHARGE USE ONLY): 2.0000 g | Freq: Three times a day (TID) | INTRAVENOUS | Status: DC

## 2023-08-19 MED ORDER — VITAMIN D 25 MCG (1000 UNIT) PO TABS
1000.0000 [IU] | ORAL_TABLET | Freq: Every day | ORAL | Status: DC
  Administered 2023-08-19 – 2023-09-05 (×16): 1000 [IU] via ORAL
  Filled 2023-08-19 (×17): qty 1

## 2023-08-19 MED ORDER — ACETAMINOPHEN 500 MG PO TABS
1000.0000 mg | ORAL_TABLET | Freq: Once | ORAL | Status: AC
  Administered 2023-08-19: 1000 mg via ORAL
  Filled 2023-08-19: qty 2

## 2023-08-19 MED ORDER — OXYCODONE HCL 5 MG PO TABS
5.0000 mg | ORAL_TABLET | ORAL | Status: DC | PRN
  Administered 2023-08-19: 5 mg via ORAL
  Administered 2023-08-20: 10 mg via ORAL
  Administered 2023-08-20: 5 mg via ORAL
  Administered 2023-08-20 – 2023-09-02 (×32): 10 mg via ORAL
  Filled 2023-08-19 (×38): qty 2
  Filled 2023-08-19: qty 1
  Filled 2023-08-19 (×4): qty 2
  Filled 2023-08-19: qty 1

## 2023-08-19 MED ORDER — BUPROPION HCL ER (XL) 150 MG PO TB24
150.0000 mg | ORAL_TABLET | Freq: Every day | ORAL | Status: DC
  Administered 2023-08-19 – 2023-09-05 (×17): 150 mg via ORAL
  Filled 2023-08-19 (×18): qty 1

## 2023-08-19 MED ORDER — LACTATED RINGERS IV SOLN: INTRAVENOUS | Status: DC | PRN

## 2023-08-19 MED ORDER — FINASTERIDE 5 MG PO TABS
5.0000 mg | ORAL_TABLET | Freq: Every day | ORAL | Status: DC
  Administered 2023-08-19 – 2023-09-05 (×16): 5 mg via ORAL
  Filled 2023-08-19 (×17): qty 1

## 2023-08-19 MED ORDER — HYDROMORPHONE HCL 1 MG/ML IJ SOLN
0.5000 mg | INTRAMUSCULAR | Status: DC | PRN
  Administered 2023-08-21 – 2023-08-30 (×9): 1 mg via INTRAVENOUS
  Filled 2023-08-19 (×12): qty 1

## 2023-08-19 MED ORDER — ONDANSETRON HCL 4 MG/2ML IJ SOLN
INTRAMUSCULAR | Status: DC | PRN
  Administered 2023-08-19: 4 mg via INTRAVENOUS

## 2023-08-19 MED ORDER — OXYCODONE HCL 5 MG PO TABS: 5.0000 mg | ORAL_TABLET | Freq: Once | ORAL | Status: DC | PRN

## 2023-08-19 MED ORDER — METOCLOPRAMIDE HCL 5 MG/ML IJ SOLN: 5.0000 mg | Freq: Three times a day (TID) | INTRAMUSCULAR | Status: DC | PRN

## 2023-08-19 MED ORDER — HYDROMORPHONE HCL 1 MG/ML IJ SOLN
INTRAMUSCULAR | Status: DC | PRN
  Administered 2023-08-19 (×4): .2 mg via INTRAVENOUS

## 2023-08-19 MED ORDER — SODIUM CHLORIDE 0.9 % IR SOLN
Status: DC | PRN
Start: 2023-08-19 — End: 2023-08-19
  Administered 2023-08-19: 3000 mL

## 2023-08-19 MED ORDER — FENTANYL CITRATE (PF) 100 MCG/2ML IJ SOLN
INTRAMUSCULAR | Status: AC
Start: 2023-08-19 — End: ?
  Filled 2023-08-19: qty 2

## 2023-08-19 MED ORDER — FENTANYL CITRATE (PF) 100 MCG/2ML IJ SOLN
INTRAMUSCULAR | Status: DC | PRN
  Administered 2023-08-19 (×2): 50 ug via INTRAVENOUS

## 2023-08-19 MED ORDER — SODIUM CHLORIDE 0.9% FLUSH
10.0000 mL | Freq: Two times a day (BID) | INTRAVENOUS | Status: DC
  Administered 2023-08-24 – 2023-09-05 (×11): 10 mL

## 2023-08-19 MED ORDER — VANCOMYCIN HCL 1000 MG IV SOLR
INTRAVENOUS | Status: AC
Start: 2023-08-19 — End: ?
  Filled 2023-08-19: qty 40

## 2023-08-19 MED ORDER — LACTATED RINGERS IV SOLN: INTRAVENOUS | Status: DC

## 2023-08-19 SURGICAL SUPPLY — 51 items
BAG COUNTER SPONGE SURGICOUNT (BAG) ×1 IMPLANT
BANDAGE ESMARK 6X9 LF (GAUZE/BANDAGES/DRESSINGS) IMPLANT
BLADE SURG SZ10 CARB STEEL (BLADE) ×1 IMPLANT
BNDG COHESIVE 4X5 TAN STRL LF (GAUZE/BANDAGES/DRESSINGS) ×1 IMPLANT
BNDG ELASTIC 3X5.8 VLCR NS LF (GAUZE/BANDAGES/DRESSINGS) IMPLANT
BNDG ELASTIC 4INX 5YD STR LF (GAUZE/BANDAGES/DRESSINGS) ×1 IMPLANT
BNDG ELASTIC 4X5.8 VLCR NS LF (GAUZE/BANDAGES/DRESSINGS) IMPLANT
BNDG ELASTIC 6INX 5YD STR LF (GAUZE/BANDAGES/DRESSINGS) ×1 IMPLANT
BNDG ESMARK 4X9 LF (GAUZE/BANDAGES/DRESSINGS) IMPLANT
BNDG GAUZE DERMACEA FLUFF 4 (GAUZE/BANDAGES/DRESSINGS) ×1 IMPLANT
CNTNR URN SCR LID CUP LEK RST (MISCELLANEOUS) IMPLANT
COVER SURGICAL LIGHT HANDLE (MISCELLANEOUS) ×1 IMPLANT
CUFF TRNQT CYL 34X4.125X (TOURNIQUET CUFF) IMPLANT
DRAPE SURG 17X23 STRL (DRAPES) IMPLANT
DRAPE U-SHAPE 47X51 STRL (DRAPES) IMPLANT
DRSG EMULSION OIL 3X16 NADH (GAUZE/BANDAGES/DRESSINGS) IMPLANT
DURAPREP 26ML APPLICATOR (WOUND CARE) ×1 IMPLANT
ELECT REM PT RETURN 15FT ADLT (MISCELLANEOUS) IMPLANT
EVACUATOR 1/8 PVC DRAIN (DRAIN) IMPLANT
FACESHIELD WRAPAROUND (MASK) ×1 IMPLANT
FACESHIELD WRAPAROUND OR TEAM (MASK) ×1 IMPLANT
GAUZE PAD ABD 8X10 STRL (GAUZE/BANDAGES/DRESSINGS) ×1 IMPLANT
GAUZE SPONGE 4X4 12PLY STRL (GAUZE/BANDAGES/DRESSINGS) ×1 IMPLANT
GAUZE XEROFORM 1X8 LF (GAUZE/BANDAGES/DRESSINGS) ×1 IMPLANT
GLOVE BIO SURGEON STRL SZ7.5 (GLOVE) ×2 IMPLANT
GLOVE BIOGEL PI IND STRL 7.5 (GLOVE) ×1 IMPLANT
GLOVE BIOGEL PI IND STRL 8 (GLOVE) ×2 IMPLANT
GOWN STRL REUS W/ TWL LRG LVL3 (GOWN DISPOSABLE) ×1 IMPLANT
KIT BASIN OR (CUSTOM PROCEDURE TRAY) ×1 IMPLANT
KIT TURNOVER KIT A (KITS) IMPLANT
MANIFOLD NEPTUNE II (INSTRUMENTS) ×1 IMPLANT
NDL HYPO 25X1 1.5 SAFETY (NEEDLE) IMPLANT
NEEDLE HYPO 25X1 1.5 SAFETY (NEEDLE) IMPLANT
NS IRRIG 1000ML POUR BTL (IV SOLUTION) ×1 IMPLANT
PACK ORTHO EXTREMITY (CUSTOM PROCEDURE TRAY) ×1 IMPLANT
PAD ARMBOARD POSITIONER FOAM (MISCELLANEOUS) ×2 IMPLANT
PADDING CAST COTTON 6X4 STRL (CAST SUPPLIES) IMPLANT
SET HNDPC FAN SPRY TIP SCT (DISPOSABLE) IMPLANT
SET IRRIG Y TYPE TUR BLADDER L (SET/KITS/TRAYS/PACK) IMPLANT
STOCKINETTE 6 STRL (DRAPES) ×1 IMPLANT
SUT ETHILON 2 0 PS N (SUTURE) IMPLANT
SUT ETHILON 3 0 PS 1 (SUTURE) IMPLANT
SUT PDS AB 1 CT1 27 (SUTURE) IMPLANT
SUT PDS AB 2-0 CT2 27 (SUTURE) IMPLANT
SWAB COLLECTION DEVICE MRSA (MISCELLANEOUS) IMPLANT
SWAB CULTURE ESWAB REG 1ML (MISCELLANEOUS) IMPLANT
SYR CONTROL 10ML LL (SYRINGE) IMPLANT
TOWEL OR 17X26 10 PK STRL BLUE (TOWEL DISPOSABLE) ×2 IMPLANT
TUBING CONNECTING 10 (TUBING) ×1 IMPLANT
UNDERPAD 30X36 HEAVY ABSORB (UNDERPADS AND DIAPERS) ×1 IMPLANT
YANKAUER SUCT BULB TIP NO VENT (SUCTIONS) ×1 IMPLANT

## 2023-08-19 NOTE — Anesthesia Postprocedure Evaluation (Signed)
 Anesthesia Post Note  Patient: Desjuan Stearns.  Procedure(s) Performed: IRRIGATION AND DEBRIDEMENT KNEE (Left: Knee)     Patient location during evaluation: PACU Anesthesia Type: General Level of consciousness: awake and alert Pain management: pain level controlled Vital Signs Assessment: post-procedure vital signs reviewed and stable Respiratory status: spontaneous breathing, nonlabored ventilation and respiratory function stable Cardiovascular status: blood pressure returned to baseline and stable Postop Assessment: no apparent nausea or vomiting Anesthetic complications: no   No notable events documented.  Last Vitals:  Vitals:   08/19/23 1844 08/19/23 1845  BP: 130/68 125/65  Pulse: 75 72  Resp: 17 13  Temp: 36.4 C   SpO2: 100% 100%    Last Pain:  Vitals:   08/19/23 1844  TempSrc:   PainSc: 0-No pain                 Earvin Goldberg

## 2023-08-19 NOTE — Transitions of Care (Post Inpatient/ED Visit) (Signed)
   08/19/2023  Name: Paul [name]. MRN: [id number] DOB: 12/21/1945  Today's TOC FU Call Status: Today's TOC FU Call Status:: Unsuccessful Call (1st Attempt) Unsuccessful Call (1st Attempt) Date: 08/19/23  Attempted to reach the patient regarding the most recent Inpatient/ED visit.  Follow Up Plan: Additional outreach attempts will be made to reach the patient to complete the Transitions of Care (Post Inpatient/ED visit) call.   Tonia Frankel RN, CCM [place]  [facility] RN Care Manager [phone]

## 2023-08-19 NOTE — Transfer of Care (Signed)
 Immediate Anesthesia Transfer of Care Note  Patient: Paul [name].  Procedure(s) Performed: IRRIGATION AND DEBRIDEMENT KNEE (Left: Knee)  Patient Location: PACU  Anesthesia Type:General  Level of Consciousness: awake, alert , oriented, and patient cooperative  Airway & Oxygen Therapy: Patient Spontanous Breathing and Patient connected to face mask oxygen  Post-op Assessment: Report given to RN and Post -op Vital signs reviewed and stable  Post vital signs: Reviewed and stable  Last Vitals:  Vitals Value Taken Time  BP 125/65 08/19/23 1845  Temp    Pulse 83 08/19/23 1847  Resp 17 08/19/23 1847  SpO2 100 % 08/19/23 1847  Vitals shown include unfiled device data.  Last Pain:  Vitals:   08/19/23 1515  TempSrc: Oral  PainSc: 5          Complications: No notable events documented.

## 2023-08-19 NOTE — Anesthesia Preprocedure Evaluation (Signed)
 Anesthesia Evaluation  Patient identified by MRN, date of birth, ID band Patient awake    Reviewed: Allergy & Precautions, NPO status , Patient's Chart, lab work & pertinent test results  Airway Mallampati: II  TM Distance: >3 FB Neck ROM: Full    Dental  (+) Teeth Intact, Dental Advisory Given   Pulmonary asthma , COPD, former smoker   Pulmonary exam normal breath sounds clear to auscultation       Cardiovascular hypertension, Pt. on medications + CAD  Normal cardiovascular exam Rhythm:Regular Rate:Normal  IMPRESSIONS     1. Left ventricular ejection fraction, by estimation, is 60 to 65%. The  left ventricle has normal function. The left ventricle has no regional  wall motion abnormalities. There is mild concentric left ventricular  hypertrophy. Left ventricular diastolic  parameters were normal.   2. Right ventricular systolic function is normal. The right ventricular  size is normal.   3. The mitral valve is normal in structure. Mild mitral valve  regurgitation. No evidence of mitral stenosis.   4. The aortic valve is tricuspid. There is mild thickening of the aortic  valve. Aortic valve regurgitation is not visualized. Aortic valve  sclerosis/calcification is present, without any evidence of aortic  stenosis.   5. The inferior vena cava is normal in size with greater than 50%  respiratory variability, suggesting right atrial pressure of 3 mmHg.   6. Agitated saline contrast bubble study was negative, with no evidence  of any interatrial shunt.   Conclusion(s)/Recommendation(s): No evidence of valvular vegetations on  this transthoracic echocardiogram. Consider a transesophageal  echocardiogram to exclude infective endocarditis if clinically indicated.     Neuro/Psych  Headaches PSYCHIATRIC DISORDERS Anxiety Depression     Neuromuscular disease    GI/Hepatic ,GERD  Medicated and Controlled,,(+) Hepatitis -, C   Endo/Other  negative endocrine ROS    Renal/GU negative Renal ROS     Musculoskeletal  (+) Arthritis ,  Post op infection L knee   Abdominal   Peds  (+) ATTENTION DEFICIT DISORDER WITHOUT HYPERACTIVITY Hematology negative hematology ROS (+)   Anesthesia Other Findings Bacteremia   Reproductive/Obstetrics                             Anesthesia Physical Anesthesia Plan  ASA: 3  Anesthesia Plan: General   Post-op Pain Management: Tylenol  PO (pre-op)*   Induction: Intravenous  PONV Risk Score and Plan: 2 and Treatment may vary due to age or medical condition, Ondansetron  and Midazolam  Airway Management Planned: LMA  Additional Equipment: None  Intra-op Plan:   Post-operative Plan: Extubation in OR  Informed Consent: I have reviewed the patients History and Physical, chart, labs and discussed the procedure including the risks, benefits and alternatives for the proposed anesthesia with the patient or authorized representative who has indicated his/her understanding and acceptance.     Dental advisory given  Plan Discussed with: CRNA  Anesthesia Plan Comments:         Anesthesia Quick Evaluation

## 2023-08-19 NOTE — Anesthesia Procedure Notes (Signed)
 Procedure Name: LMA Insertion Date/Time: 08/19/2023 5:57 PM  Performed by: Colisha Redler, CRNAPre-anesthesia Checklist: Patient identified, Emergency Drugs available, Suction available and Patient being monitored Patient Re-evaluated:Patient Re-evaluated prior to induction Oxygen Delivery Method: Circle system utilized Preoxygenation: Pre-oxygenation with 100% oxygen Induction Type: IV induction Ventilation: Mask ventilation without difficulty LMA: LMA inserted LMA Size: 4.0 Tube type: Oral Number of attempts: 1 Airway Equipment and Method: Stylet and Oral airway Placement Confirmation: positive ETCO2 and breath sounds checked- equal and bilateral Tube secured with: Tape Dental Injury: Teeth and Oropharynx as per pre-operative assessment

## 2023-08-19 NOTE — Plan of Care (Signed)
   Problem: Coping: Goal: Level of anxiety will decrease Outcome: Progressing   Problem: Pain Managment: Goal: General experience of comfort will improve and/or be controlled Outcome: Progressing   Problem: Safety: Goal: Ability to remain free from injury will improve Outcome: Progressing
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
## 2023-08-20 ENCOUNTER — Encounter (HOSPITAL_COMMUNITY): Payer: Self-pay | Admitting: Orthopedic Surgery

## 2023-08-20 DIAGNOSIS — B9561 Methicillin susceptible Staphylococcus aureus infection as the cause of diseases classified elsewhere: Secondary | ICD-10-CM

## 2023-08-20 DIAGNOSIS — M009 Pyogenic arthritis, unspecified: Secondary | ICD-10-CM | POA: Diagnosis not present

## 2023-08-20 DIAGNOSIS — A499 Bacterial infection, unspecified: Secondary | ICD-10-CM | POA: Diagnosis not present

## 2023-08-20 DIAGNOSIS — R7881 Bacteremia: Secondary | ICD-10-CM | POA: Diagnosis not present

## 2023-08-20 DIAGNOSIS — M00062 Staphylococcal arthritis, left knee: Secondary | ICD-10-CM | POA: Diagnosis not present

## 2023-08-20 LAB — COMPREHENSIVE METABOLIC PANEL WITH GFR
ALT: 16 U/L (ref 0–44)
AST: 34 U/L (ref 15–41)
Albumin: 2.3 g/dL — ABNORMAL LOW (ref 3.5–5.0)
Alkaline Phosphatase: 45 U/L (ref 38–126)
Anion gap: 7 (ref 5–15)
BUN: 19 mg/dL (ref 8–23)
CO2: 25 mmol/L (ref 22–32)
Calcium: 8.2 mg/dL — ABNORMAL LOW (ref 8.9–10.3)
Chloride: 99 mmol/L (ref 98–111)
Creatinine, Ser: 0.9 mg/dL (ref 0.61–1.24)
GFR, Estimated: >60 mL/min (ref >60)
Glucose, Bld: 152 mg/dL — ABNORMAL HIGH (ref 70–99)
Potassium: 4.5 mmol/L (ref 3.5–5.1)
Sodium: 131 mmol/L — ABNORMAL LOW (ref 135–145)
Total Bilirubin: 0.6 mg/dL (ref 0.0–1.2)
Total Protein: 6 g/dL — ABNORMAL LOW (ref 6.5–8.1)

## 2023-08-20 LAB — CBC
HCT: 37.5 % — ABNORMAL LOW (ref 39.0–52.0)
Hemoglobin: 12.2 g/dL — ABNORMAL LOW (ref 13.0–17.0)
MCH: 30.7 pg (ref 26.0–34.0)
MCHC: 32.5 g/dL (ref 30.0–36.0)
MCV: 94.5 fL (ref 80.0–100.0)
Platelets: 293 10*3/uL (ref 150–400)
RBC: 3.97 MIL/uL — ABNORMAL LOW (ref 4.22–5.81)
RDW: 12.5 % (ref 11.5–15.5)
WBC: 8.9 10*3/uL (ref 4.0–10.5)
nRBC: 0 % (ref 0.0–0.2)

## 2023-08-20 MED ORDER — CEFAZOLIN IV (FOR PTA / DISCHARGE USE ONLY): 2.0000 g | Freq: Three times a day (TID) | INTRAVENOUS | 0 refills | Status: DC

## 2023-08-20 NOTE — Progress Notes (Signed)
 PHARMACY CONSULT NOTE FOR:  OUTPATIENT  PARENTERAL ANTIBIOTIC THERAPY (OPAT)  Indication: MSSA bacteremia/septic arthritis  Regimen: cefazolin  2g IV q8h End date: 09/16/23  IV antibiotic discharge orders are pended. To discharging provider:  please sign these orders via discharge navigator,  Select New Orders & click on the button choice - Manage This Unsigned Work.     Thank you for allowing pharmacy to be a part of this patient's care.  Vashti Gentles 08/20/2023, 1:36 PM Student Pharmacist

## 2023-08-20 NOTE — Transitions of Care (Post Inpatient/ED Visit) (Unsigned)
 08/20/2023 Per Chart review-Patient readmitted to hospital 08/19/23 - No further TOC outreach indicated for 08/17/23 discharge.   Patient ID: Jesse Moritz., male   DOB: 11-12-1945, 78 y.o.   MRN: [id number]  Tonia Frankel RN, CCM [place]  VBCI-Population Health RN Care Manager [phone]

## 2023-08-20 NOTE — Progress Notes (Signed)
 Subjective: Patient reports pain as moderate. Much better than yesterday. Says he feels more like himself again. Tolerating diet.  Urinating.   No CP, SOB.  Has not mobilized OOB with PT yet.  He reports his drain has been emptied twice but wasn't full either time. Chart shows 115cc for drain output logged.  Objective:   VITALS:   Vitals:   08/19/23 2254 08/20/23 0126 08/20/23 0525 08/20/23 1011  BP: (!) 108/59 (!) 104/58 113/72 (!) 104/57  Pulse: 70 (!) 54 64 61  Resp: 17 17 17 17   Temp: 98.3 F (36.8 C) 98.2 F (36.8 C) 98 F (36.7 C) 97.7 F (36.5 C)  TempSrc: Oral Oral Oral   SpO2: 95% 99% 99% 99%  Weight:      Height:          Latest Ref Rng & Units 08/20/2023    3:49 AM 08/19/2023    9:51 PM 08/15/2023    2:31 PM  CBC  WBC 4.0 - 10.5 K/uL 8.9  10.8  11.6   Hemoglobin 13.0 - 17.0 g/dL 40.9  81.1  91.4   Hematocrit 39.0 - 52.0 % 37.5  39.9  41.5   Platelets 150 - 400 K/uL 293  309  283       Latest Ref Rng & Units 08/20/2023    3:49 AM 08/19/2023    9:51 PM 08/14/2023    6:10 AM  BMP  Glucose 70 - 99 mg/dL 782   956   BUN 8 - 23 mg/dL 19   26   Creatinine 2.13 - 1.24 mg/dL 0.86  5.78  4.69   Sodium 135 - 145 mmol/L 131   135   Potassium 3.5 - 5.1 mmol/L 4.5   3.8   Chloride 98 - 111 mmol/L 99   105   CO2 22 - 32 mmol/L 25   22   Calcium  8.9 - 10.3 mg/dL 8.2   8.5    Intake/Output      06/09 0701 06/10 0700 06/10 0701 06/11 0700   P.O. 440 240   I.V. (mL/kg) 1000 (12.4)    IV Piggyback 192.1 7.7   Total Intake(mL/kg) 1632.1 (20.2) 247.7 (3.1)   Urine (mL/kg/hr) 1075    Drains 115 0   Blood 25    Total Output 1215 0   Net +417.1 +247.7           Physical Exam: General: NAD.  Sitting up in bed, calm, comfortable Resp: No increased wob Cardio: regular rate and rhythm ABD soft Neurologically intact MSK Neurovascularly intact Sensation intact distally Intact pulses distally Dorsiflexion/Plantar flexion intact Incision: dressing C/D/I Drain in  place with ~50 cc serosanguinous fluid in tubing and in canister Can do SLR Compartments soft  Assessment: 1 Day Post-Op  S/P Procedure(s) (LRB): IRRIGATION AND DEBRIDEMENT KNEE (Left) by Dr. Deloras Fess. Murphy on 08/19/23  Principal Problem:   Septic joint of left knee joint (HCC)   Plan: Will continue to monitor specimen   Advance diet Up with therapy Incentive Spirometry Elevate and Apply ice  Weightbearing: WBAT LLE in knee brace Insicional and dressing care: Dressings left intact until follow-up and Reinforce dressings as needed Orthopedic device(s): knee brace and drain Showering: Keep dressing dry VTE prophylaxis: Lovenox  40mg  qd while inpatient, SCDs, ambulation Pain control: PRN  Contact information:  Randal Bury MD, Marzella Solan PA-C  Dispo: TBD based on how he feels and mobilizes over the next day or so.  Lore Rode, PA-C Office [phone] 08/20/2023, 1:22 PM

## 2023-08-20 NOTE — TOC Initial Note (Addendum)
 Transition of Care (TOC) - Initial/Assessment Note    Patient Details  Name: Paul [name]. MRN: [id number] Date of Birth: 1945-08-27  Transition of Care [facility]) CM/SW Contact:    Bari Leys, RN Phone Number: 08/20/2023, 11:28 AM  Clinical Narrative:  Met with patient at b bedside to introduce role of TOC/NCM and review for dc planning, pt resides with spouse in private residence, currently receiving home iv abx through [facility] and [facility] RN services through [place] for PICC line care. Patient reports he is independent with his care with support from his spouse.Pam with [facility] notified of admission, confirmed patient on service for home IV abx and [facility] RN services. PT eval, await recommendation.  TOC will  continue to follow.    -2:47pm PT eval completed, recommendation for OPPT, pt agreeable, prefers [facility] on [address] OPPT referral entered for [employer]   Expected Discharge Plan: Home w Home Health Services Barriers to Discharge: Continued Medical Work up   Patient Goals and CMS Choice Patient states their goals for this hospitalization and ongoing recovery are:: return home          Expected Discharge Plan and Services       Living arrangements for the past 2 months: Single Family Home                                      Prior Living Arrangements/Services Living arrangements for the past 2 months: Single Family Home Lives with:: Spouse Patient language and need for interpreter reviewed:: Yes Do you feel safe going back to the place where you live?: Yes      Need for Family Participation in Patient Care: Yes (Comment) Care giver support system in place?: Yes (comment) Current home services: Home RN ([facility]) Criminal Activity/Legal Involvement Pertinent to Current Situation/Hospitalization: No - Comment as needed  Activities of Daily Living   ADL Screening (condition at time  of admission) Independently performs ADLs?: Yes (appropriate for developmental age) Is the patient deaf or have difficulty hearing?: No Does the patient have difficulty seeing, even when wearing glasses/contacts?: No Does the patient have difficulty concentrating, remembering, or making decisions?: No  Permission Sought/Granted                  Emotional Assessment Appearance:: Appears stated age Attitude/Demeanor/Rapport: Engaged Affect (typically observed): Accepting Orientation: : Oriented to Self, Oriented to Place, Oriented to  Time, Oriented to Situation Alcohol / Substance Use: Not Applicable Psych Involvement: No (comment)  Admission diagnosis:  Septic joint of left knee joint [facility]) [M00.9] Patient Active Problem List   Diagnosis Date Noted   Septic joint of left knee joint (HCC) 08/19/2023   MSSA bacteremia 08/13/2023   Septic arthritis (HCC) 08/12/2023   Gram positive bacterial infection 08/12/2023   History of atrial fibrillation without current medication 08/12/2023   Skin lesion 07/16/2023   Edema 05/20/2023   New onset atrial fibrillation (HCC) 05/03/2023   Atherosclerosis of aorta (HCC) 05/03/2023   Mixed hyperlipidemia 05/03/2023   Agatston coronary artery calcium  score less than 100 05/03/2023   Benign hypertension 05/03/2023   Quadriceps tendon rupture 05/03/2023   Rupture of bilateral distal quadriceps tendon 04/30/2023   Intractable pain 04/29/2023   Thigh hematoma 04/28/2023   Hemarthrosis of right knee 04/28/2023   Memory problem 04/16/2023   Upper airway cough syndrome 02/28/2023  Tinnitus 12/17/2022   COPD, mild (HCC) 10/25/2022   Asymmetrical thyroid  10/25/2022   Labral tear of shoulder, left, initial encounter 05/30/2022   CAP (community acquired pneumonia) 02/27/2022   Hoarse voice quality 01/08/2022   Family history of skin cancer 06/23/2021   History of malignant neoplasm of skin 06/23/2021   Melanocytic nevi of trunk 06/23/2021    Other seborrheic keratosis 06/23/2021   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   Hypogonadism in male 09/06/2020   Coronary atherosclerosis 05/30/2020   Hearing loss 05/30/2020   Onychomycosis 05/30/2020   Near syncope 02/24/2019   Gross hematuria 12/10/2018   Constipation 12/10/2018   Intertrigo 11/19/2018   Cervical radiculopathy 10/03/2017   Bursitis of left shoulder 10/03/2017   Trigger point of left shoulder region 08/16/2017   Periscapular pain 08/06/2017   Reactive airway disease 06/08/2017   Rotator cuff tear 12/05/2016   Easy bruising 11/09/2016   Subscapular bursitis 10/12/2016   Supraspinatus tendinitis, right 10/12/2016   Shoulder pain, acute 10/11/2016   Knee pain, left 10/11/2016   Rash and nonspecific skin eruption 04/06/2016   Inguinal hernia 10/10/2015   Asthmatic bronchitis 03/18/2014   GERD (gastroesophageal reflux disease) 02/11/2014   Dysphagia 02/11/2014   Cough 02/11/2014   Gum abscess 10/02/2013   Neck pain 07/03/2013   Hypertension 01/22/2012   TMJ arthralgia 04/17/2011   Well adult exam 01/08/2011   Chronic low back pain 10/16/2010   Kidney cyst, acquired 09/11/2010   URINARY CALCULUS 05/05/2010   ABSCESS, PERIRECTAL 04/07/2010   EPISTAXIS 04/07/2010   Headache 02/24/2010   PARESTHESIA 02/24/2010   INSOMNIA, CHRONIC 11/10/2009   SINUSITIS- ACUTE-NOS 10/21/2009   Allergic rhinitis 01/07/2009   ELBOW PAIN 01/07/2009   Anxiety disorder 12/16/2007   Depression 12/16/2007   MYALGIA 12/16/2007   CARPAL TUNNEL SYNDROME 01/31/2007   HEPATITIS C CARRIER 01/31/2007   HEPATITIS C 07/31/2006   Dyslipidemia 07/31/2006   Attention deficit disorder 07/31/2006   BENIGN PROSTATIC HYPERTROPHY, HX OF 07/31/2006   TONSILLECTOMY AND ADENOIDECTOMY, HX OF 07/31/2006   UMBILICAL HERNIORRHAPHY, HX OF 07/31/2006   PCP:  [name], Oakley Bellman, MD Pharmacy:   [facility] - [place], [place] - [address] [place] [address] Phone: [phone] Fax: [phone]  [id number] [place] Nest[place], [place] - [address] AT [facility] [address] & [facility] [address] [place] [address] Phone: [phone] Fax: [phone]  [id number] - [place], [place] - [address] AT [address] [place] [address] Phone: [phone] Fax: [phone]     Social Drivers of Health (SDOH) Social History: SDOH Screenings   Food Insecurity: No Food Insecurity (08/19/2023)  Housing: Low Risk  (08/19/2023)  Transportation Needs: No Transportation Needs (08/19/2023)  Utilities: Not At Risk (08/19/2023)  Depression (PHQ2-9): Low Risk  (04/16/2023)  Social Connections: Moderately Isolated (08/19/2023)  Tobacco Use: Medium Risk (08/19/2023)   SDOH Interventions:     Readmission Risk Interventions    08/20/2023   11:26 AM 05/01/2023    1:40 PM  Readmission Risk Prevention Plan  Transportation Screening Complete Complete  PCP or Specialist Appt within 5-7 Days  Complete  PCP or Specialist Appt within 3-5 Days Complete   Home Care Screening  Complete  Medication Review (RN CM)  Complete  HRI or Home Care Consult Complete   Social Work Consult for Recovery Care Planning/Counseling Complete   Palliative Care Screening Not  Applicable   Medication Review [employer]) Complete
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
## 2023-08-20 NOTE — Op Note (Addendum)
 08/19/2023  7:09 AM  PATIENT:  Paul [name].    PRE-OPERATIVE DIAGNOSIS:  Septic Left Knee  POST-OPERATIVE DIAGNOSIS:  Same  PROCEDURE:  IRRIGATION AND DEBRIDEMENT KNEE  SURGEON:  Saundra Curl, MD  ASSISTANT: Marzella Solan, PA-C, he was present and scrubbed throughout the case, critical for completion in a timely fashion, and for retraction, instrumentation, and closure.   ANESTHESIA:   General  PREOPERATIVE INDICATIONS:  Paul [name]. is a  78 y.o. male with a diagnosis of Septic Left Knee who failed conservative measures and elected for surgical management.    The risks benefits and alternatives were discussed with the patient preoperatively including but not limited to the risks of infection, bleeding, nerve injury, cardiopulmonary complications, the need for revision surgery, among others, and the patient was willing to proceed.  OPERATIVE IMPLANTS: VAC powder 1 g  OPERATIVE FINDINGS: Purulent fluid within the knee  BLOOD LOSS: Minimal  COMPLICATIONS: None  TOURNIQUET TIME: None  OPERATIVE PROCEDURE:  Patient was identified in the preoperative holding area and site was marked by me He was transported to the operating theater and placed on the table in supine position taking care to pad all bony prominences. After a preincinduction time out anesthesia was induced. The left lower extremity was prepped and draped in normal sterile fashion and a pre-incision timeout was performed. He received Ancef  after culture collection for preoperative antibiotics.   I opened his longitudinal incision noted primarily good healing of the superficial space despite some erythema he did however have fluid collection within the knee joint that was expressible.  I created a longitudinal split in the quad near his previous defect via the space to use a rondure to perform a synovectomy of the knee and the superior portion with a rondure.  Next I performed an irrigation I made an  inferior lateral portal into the knee joint as well through the already existing skin incision I irrigated with 6 L of saline I then thoroughly irrigated with Irrisept  I placed vancomycin  powder within the knee joint and in the superficial spaces I closed the quad split with a PDS stitch and skin with a nylo. This was a complex closure requiring adjacent tissue mobilization and transfer of less than 10sq cm   I did place a Hemovac drain which will be removed in 2 days  POST OPERATIVE PLAN: Admit him for pain control continue infectious disease plan for antibiotics

## 2023-08-21 ENCOUNTER — Inpatient Hospital Stay (HOSPITAL_COMMUNITY)

## 2023-08-21 LAB — COMPREHENSIVE METABOLIC PANEL WITH GFR
ALT: 20 U/L (ref 0–44)
AST: 50 U/L — ABNORMAL HIGH (ref 15–41)
Albumin: 2.3 g/dL — ABNORMAL LOW (ref 3.5–5.0)
Alkaline Phosphatase: 48 U/L (ref 38–126)
Anion gap: 8 (ref 5–15)
BUN: 22 mg/dL (ref 8–23)
CO2: 24 mmol/L (ref 22–32)
Calcium: 7.9 mg/dL — ABNORMAL LOW (ref 8.9–10.3)
Chloride: 103 mmol/L (ref 98–111)
Creatinine, Ser: 0.83 mg/dL (ref 0.61–1.24)
GFR, Estimated: >60 mL/min (ref >60)
Glucose, Bld: 116 mg/dL — ABNORMAL HIGH (ref 70–99)
Potassium: 3.6 mmol/L (ref 3.5–5.1)
Sodium: 135 mmol/L (ref 135–145)
Total Bilirubin: 0.4 mg/dL (ref 0.0–1.2)
Total Protein: 5.6 g/dL — ABNORMAL LOW (ref 6.5–8.1)

## 2023-08-21 LAB — CBC
HCT: 37.4 % — ABNORMAL LOW (ref 39.0–52.0)
Hemoglobin: 12.3 g/dL — ABNORMAL LOW (ref 13.0–17.0)
MCH: 31 pg (ref 26.0–34.0)
MCHC: 32.9 g/dL (ref 30.0–36.0)
MCV: 94.2 fL (ref 80.0–100.0)
Platelets: 322 10*3/uL (ref 150–400)
RBC: 3.97 MIL/uL — ABNORMAL LOW (ref 4.22–5.81)
RDW: 12.7 % (ref 11.5–15.5)
WBC: 11.1 10*3/uL — ABNORMAL HIGH (ref 4.0–10.5)
nRBC: 0 % (ref 0.0–0.2)

## 2023-08-21 NOTE — Progress Notes (Signed)
 Pt complain of pain/tenderness to right elbow.  Pt stated it has been that way for a couple of days now.  Pt denies hitting his elbow.  Elbow slightly red upon assessment.  Will continue to monitor.

## 2023-08-21 NOTE — Progress Notes (Signed)
 IV Team at bedside to access right upper extremity Picc. Pt complains of soreness and swelling to bilateral upper extremities. Pt stated right arm began to hurt yesterday and left arm began to hurt today. Pt asked if blood pressures have been taken on right arm after the Picc was placed, pt stated ''Yes, blood pressures have been taken on right lower arm for a couple of days, since the Picc was placed. Right arm restriction bracelet present and remain intact.   Bilateral arms elevated and ice packs intact. Primary nurse consulted and venous duplexes are recommended to rule out bilateral upper extremity DVT.

## 2023-08-21 NOTE — Progress Notes (Signed)
 Subjective: Patient reports pain as moderate. Worse today than yesterday unfortunately. Feels run down. Tolerating diet.  Urinating.   No CP, SOB.  Has mobilized OOB with PT yesterday but none today.  Drain output 140cc  Objective:   VITALS:   Vitals:   08/20/23 2050 08/21/23 0536 08/21/23 1025 08/21/23 1349  BP: (!) 121/59 (!) 143/85 (!) 144/87 (!) 140/72  Pulse: 66 62 66 74  Resp: 18 18 17 17   Temp: (!) 97.5 F (36.4 C) 98.3 F (36.8 C) 98.1 F (36.7 C) 98.7 F (37.1 C)  TempSrc: Oral Oral Oral Oral  SpO2: 96% 99% 100% 100%  Weight:      Height:          Latest Ref Rng & Units 08/21/2023    3:33 AM 08/20/2023    3:49 AM 08/19/2023    9:51 PM  CBC  WBC 4.0 - 10.5 K/uL 11.1  8.9  10.8   Hemoglobin 13.0 - 17.0 g/dL 42.5  95.6  38.7   Hematocrit 39.0 - 52.0 % 37.4  37.5  39.9   Platelets 150 - 400 K/uL 322  293  309       Latest Ref Rng & Units 08/21/2023    3:33 AM 08/20/2023    3:49 AM 08/19/2023    9:51 PM  BMP  Glucose 70 - 99 mg/dL 564  332    BUN 8 - 23 mg/dL 22  19    Creatinine 9.51 - 1.24 mg/dL 8.84  1.66  0.63   Sodium 135 - 145 mmol/L 135  131    Potassium 3.5 - 5.1 mmol/L 3.6  4.5    Chloride 98 - 111 mmol/L 103  99    CO2 22 - 32 mmol/L 24  25    Calcium  8.9 - 10.3 mg/dL 7.9  8.2     Intake/Output      06/10 0701 06/11 0700 06/11 0701 06/12 0700   P.O. 630 720   I.V. (mL/kg)     IV Piggyback 208.8    Total Intake(mL/kg) 838.8 (10.4) 720 (8.9)   Urine (mL/kg/hr) 750 (0.4) 950 (1.5)   Drains 25    Stool 0    Blood     Total Output 775 950   Net +63.8 -230        Urine Occurrence 0 x    Stool Occurrence 0 x       Physical Exam: General: NAD.  Sitting up in bed, calm, comfortable Resp: No increased wob Cardio: regular rate and rhythm ABD soft Neurologically intact MSK Neurovascularly intact Sensation intact distally Intact pulses distally Dorsiflexion/Plantar flexion intact Incision: dressing C/D/I Drain in place with ~ 50 cc  serosanguinous fluid in tubing and in canister Can do SLR Compartments soft  Assessment: 2 Days Post-Op  S/P Procedure(s) (LRB): IRRIGATION AND DEBRIDEMENT KNEE (Left) by Dr. Deloras Fess. Murphy on 08/19/23  Principal Problem:   Septic joint of left knee joint (HCC)   Plan: Will continue to monitor specimen. Showing Staph aureus so far  Order nutrition consult to help make sure he is getting enough nutrients to help with energy and healing   Advance diet Up with therapy Incentive Spirometry Elevate and Apply ice  Weightbearing: WBAT LLE in knee brace Insicional and dressing care: Dressings left intact until follow-up and Reinforce dressings as needed Orthopedic device(s): knee brace and drain Showering: Keep dressing dry VTE prophylaxis: Lovenox  40mg  qd while inpatient, SCDs, ambulation Pain control: PRN  Contact  information:  Randal Bury MD, Marzella Solan PA-C  Dispo: TBD based on how he feels and mobilizes over the next day or so.      Lore Rode, PA-C Office [phone] 08/21/2023, 3:05 PM

## 2023-08-21 NOTE — Plan of Care (Signed)
  Problem: Education: Goal: Knowledge of General Education information will improve Description: Including pain rating scale, medication(s)/side effects and non-pharmacologic comfort measures Outcome: Progressing   Problem: Clinical Measurements: Goal: Will remain free from infection Outcome: Progressing Goal: Diagnostic test results will improve Outcome: Progressing   Problem: Coping: Goal: Level of anxiety will decrease Outcome: Progressing   

## 2023-08-22 ENCOUNTER — Inpatient Hospital Stay (HOSPITAL_COMMUNITY)

## 2023-08-22 DIAGNOSIS — I480 Paroxysmal atrial fibrillation: Secondary | ICD-10-CM | POA: Diagnosis present

## 2023-08-22 DIAGNOSIS — I82409 Acute embolism and thrombosis of unspecified deep veins of unspecified lower extremity: Secondary | ICD-10-CM | POA: Diagnosis present

## 2023-08-22 DIAGNOSIS — M00062 Staphylococcal arthritis, left knee: Secondary | ICD-10-CM | POA: Diagnosis not present

## 2023-08-22 DIAGNOSIS — L719 Rosacea, unspecified: Secondary | ICD-10-CM | POA: Insufficient documentation

## 2023-08-22 DIAGNOSIS — M7989 Other specified soft tissue disorders: Secondary | ICD-10-CM

## 2023-08-22 DIAGNOSIS — Z86718 Personal history of other venous thrombosis and embolism: Secondary | ICD-10-CM | POA: Diagnosis present

## 2023-08-22 LAB — CBC
HCT: 41.5 % (ref 39.0–52.0)
Hemoglobin: 13.7 g/dL (ref 13.0–17.0)
MCH: 30.6 pg (ref 26.0–34.0)
MCHC: 33 g/dL (ref 30.0–36.0)
MCV: 92.8 fL (ref 80.0–100.0)
Platelets: 390 10*3/uL (ref 150–400)
RBC: 4.47 MIL/uL (ref 4.22–5.81)
RDW: 12.6 % (ref 11.5–15.5)
WBC: 10.8 10*3/uL — ABNORMAL HIGH (ref 4.0–10.5)
nRBC: 0 % (ref 0.0–0.2)

## 2023-08-22 LAB — COMPREHENSIVE METABOLIC PANEL WITH GFR
ALT: 16 U/L (ref 0–44)
AST: 33 U/L (ref 15–41)
Albumin: 2.5 g/dL — ABNORMAL LOW (ref 3.5–5.0)
Alkaline Phosphatase: 50 U/L (ref 38–126)
Anion gap: 10 (ref 5–15)
BUN: 17 mg/dL (ref 8–23)
CO2: 24 mmol/L (ref 22–32)
Calcium: 8.4 mg/dL — ABNORMAL LOW (ref 8.9–10.3)
Chloride: 100 mmol/L (ref 98–111)
Creatinine, Ser: 0.75 mg/dL (ref 0.61–1.24)
GFR, Estimated: >60 mL/min (ref >60)
Glucose, Bld: 99 mg/dL (ref 70–99)
Potassium: 4.1 mmol/L (ref 3.5–5.1)
Sodium: 134 mmol/L — ABNORMAL LOW (ref 135–145)
Total Bilirubin: 0.8 mg/dL (ref 0.0–1.2)
Total Protein: 6.2 g/dL — ABNORMAL LOW (ref 6.5–8.1)

## 2023-08-22 MED ORDER — ENOXAPARIN SODIUM 80 MG/0.8ML IJ SOSY
80.0000 mg | PREFILLED_SYRINGE | Freq: Two times a day (BID) | INTRAMUSCULAR | Status: AC
  Administered 2023-08-22 – 2023-08-28 (×12): 80 mg via SUBCUTANEOUS
  Filled 2023-08-22 (×13): qty 0.8

## 2023-08-22 MED ORDER — ENOXAPARIN SODIUM 40 MG/0.4ML IJ SOSY
40.0000 mg | PREFILLED_SYRINGE | INTRAMUSCULAR | Status: AC
  Administered 2023-08-22: 40 mg via SUBCUTANEOUS
  Filled 2023-08-22: qty 0.4

## 2023-08-22 MED ORDER — JUVEN PO PACK
1.0000 | PACK | Freq: Two times a day (BID) | ORAL | Status: DC
  Administered 2023-08-22 – 2023-08-28 (×5): 1 via ORAL
  Filled 2023-08-22 (×13): qty 1

## 2023-08-22 MED ORDER — ADULT MULTIVITAMIN W/MINERALS CH
1.0000 | ORAL_TABLET | Freq: Every day | ORAL | Status: DC
  Administered 2023-08-22 – 2023-09-05 (×13): 1 via ORAL
  Filled 2023-08-22 (×14): qty 1
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
## 2023-08-22 NOTE — Progress Notes (Signed)
 Patient called and c/o LUE starting to feel like his RUE with pain and swelling. Patient assessed with increased edema to RUE extending from elbow to hand and LUE with edema from elbow to hand. No c/o chest pain or dyspnea noted at this time. On-Call PA Bevin Bucks) made aware of the patient status and order given to elevate and ice (BLE's), IV Nurse to assess PICC, and order for Vascular US  to r/o bilateral upper extremity DVT.
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
## 2023-08-22 NOTE — Progress Notes (Signed)
 Subjective: Patient reports pain as moderate in knee. Pain in arms worse than knee. Arm pain and swelling started yesterday in right elbow and now also in L forearm. Doppler ultrasounds done that show DVT in RUE an SVT in LUE.  Not eating much since doesn't like hospital food so I ordered a nutritionist consult.  Urinating.   No CP, SOB.  Hasn't mobilized OOB with PT past 2 days.  Drain output 140cc  Objective:   VITALS:   Vitals:   08/21/23 2118 08/22/23 0510 08/22/23 0902 08/22/23 1358  BP: 132/72 130/64  112/63  Pulse: 83 70  67  Resp: 18 18  16   Temp: 99 F (37.2 C) 98.4 F (36.9 C)  98.6 F (37 C)  TempSrc: Oral Oral  Oral  SpO2: 100% 99% 99% 98%  Weight:      Height:          Latest Ref Rng & Units 08/22/2023    1:12 PM 08/21/2023    3:33 AM 08/20/2023    3:49 AM  CBC  WBC 4.0 - 10.5 K/uL 10.8  11.1  8.9   Hemoglobin 13.0 - 17.0 g/dL 32.6  71.2  45.8   Hematocrit 39.0 - 52.0 % 41.5  37.4  37.5   Platelets 150 - 400 K/uL 390  322  293       Latest Ref Rng & Units 08/22/2023    1:12 PM 08/21/2023    3:33 AM 08/20/2023    3:49 AM  BMP  Glucose 70 - 99 mg/dL 99  099  833   BUN 8 - 23 mg/dL 17  22  19    Creatinine 0.61 - 1.24 mg/dL 8.25  0.53  9.76   Sodium 135 - 145 mmol/L 134  135  131   Potassium 3.5 - 5.1 mmol/L 4.1  3.6  4.5   Chloride 98 - 111 mmol/L 100  103  99   CO2 22 - 32 mmol/L 24  24  25    Calcium  8.9 - 10.3 mg/dL 8.4  7.9  8.2    Intake/Output      06/11 0701 06/12 0700 06/12 0701 06/13 0700   P.O. 1320 240   Other 80    IV Piggyback 200 200   Total Intake(mL/kg) 1600 (19.8) 440 (5.5)   Urine (mL/kg/hr) 1850 (1) 525 (0.7)   Drains     Stool 0 0   Total Output 1850 525   Net -250 -85        Urine Occurrence 2 x 1 x   Stool Occurrence 0 x 0 x      Physical Exam: General: NAD.  Sitting up in bed, calm Resp: No increased wob Cardio: regular rate and rhythm ABD soft Neurologically intact MSK Neurovascularly intact Sensation intact  distally Intact pulses distally Dorsiflexion/Plantar flexion intact Incision: dressing C/D/I Drain in place with ~ 50 cc serosanguinous fluid in tubing and in canister Can do SLR Compartments soft  Assessment: 3 Days Post-Op  S/P Procedure(s) (LRB): IRRIGATION AND DEBRIDEMENT KNEE (Left) by Dr. Deloras Fess. Abigail Abler on 08/19/23  Principal Problem:   Septic joint of left knee joint (HCC) Active Problems:   Dyslipidemia   Anxiety disorder   Depression   Hypertension   GERD (gastroesophageal reflux disease)   Coronary atherosclerosis   MSSA bacteremia   Paroxysmal atrial fibrillation (HCC)   DVT (deep venous thrombosis) (HCC)   Plan: Will continue to monitor specimen. Showing Staph aureus so far  Dressings  taken down and drain removed. Skin looks good. Redressed wound. Will see how he is doing in AM and decide if should proceed with repeat I&D Friday or not.    Advance diet Up with therapy Incentive Spirometry Elevate and Apply ice  Weightbearing: WBAT LLE in knee brace Insicional and dressing care: Dressings left intact until follow-up and Reinforce dressings as needed Orthopedic device(s): knee brace and drain Showering: Keep dressing dry VTE prophylaxis: Lovenox  40mg  qd while inpatient, SCDs, ambulation Pain control: PRN  Contact information:  Randal Bury MD, [address] PA-C   Lore Rode, [place] Office [phone] 08/22/2023, 4:50 PM

## 2023-08-22 NOTE — Progress Notes (Signed)
 Assessed RUA;Noted right and left hand swelling. Right upper arm is soft and warm to touch. No drainage or swelling noted between the wrist and underarm at this time.
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
## 2023-08-22 NOTE — Progress Notes (Signed)
 BUE venous duplex has been completed.  Preliminary results given to Lucio Sabin, RN.   Results can be found under chart review under CV PROC. 08/22/2023 12:37 PM Dudley Cooley RVT, RDMS
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
## 2023-08-22 NOTE — Progress Notes (Signed)
 [facility]  Date of Admission:  08/19/2023   Total days of inpatient antibiotics 3  Principal Problem:   Septic joint of left knee joint (HCC) Active Problems:   Dyslipidemia   Anxiety disorder   Depression   Hypertension   GERD (gastroesophageal reflux disease)   Coronary atherosclerosis   MSSA bacteremia   Paroxysmal atrial fibrillation (HCC)   DVT (deep venous thrombosis) [facility])          Assessment: 78 year old male with history of MSSA bacteremia, left knee septic arthritis with MSSA on cefazolin  x 4 weeks readmitted for left knee I&D. #Left knee septic arthritis status post I&D on 6/9 - Patient had initially admitted for MSSA bacteremia found to have left septic knee blood cultures cleared on 6/4.  He was taken to the OR on 6/4 for I&D of deep abscess of knee.  ID was engaged and patient discharged on cefazolin  x 4 weeks as TEE did not show vegetation. - Patient reports that he went home on Saturday and then knee pain worsened.  He was seen by orthopedics on Monday and taken to the OR on 6/9 for debridement.  Per OR note fluid collection was in the knee joint as well.   Recommendations:  - Continue cefazolin . - Reset the clock on 4 weeks antibiotics from OR -F/U staph aureus sens from 6/9 -OR tomorrow   #DVT #R PICC -Found to have R axillary DVT -IV team reviewing PICC line -Switched to DOAC -Internal med engaged   Microbiology:   Antibiotics: Cefazolin  6/2-present   Cultures: Blood 6/22/2 MSSA 6/4 no growth  6/9 OR Cx stpah auresu SUBJECTIVE: Resting in bed. Wife at bedside Interval: afebrile overnight  Review of Systems: Review of Systems  All other systems reviewed and are negative.    Scheduled Meds:  atorvastatin   10 mg Oral Daily   buPROPion   150 mg Oral Daily   Chlorhexidine  Gluconate Cloth  6 each Topical Daily   cholecalciferol  1,000 Units Oral Daily   docusate sodium   100 mg Oral BID   enoxaparin   (LOVENOX ) injection  80 mg Subcutaneous Q12H   finasteride   5 mg Oral Daily   fluticasone  furoate-vilanterol  1 puff Inhalation Daily   irbesartan   150 mg Oral Daily   metoprolol  tartrate  25 mg Oral BID   multivitamin with minerals  1 tablet Oral Daily   mupirocin  ointment  1 Application Nasal BID   nutrition supplement (JUVEN)  1 packet Oral BID BM   oxyCODONE   10 mg Oral Q12H   pantoprazole   40 mg Oral Daily   sodium chloride  flush  10-40 mL Intracatheter Q12H   triamterene -hydrochlorothiazide  1 tablet Oral Daily   Continuous Infusions:   ceFAZolin  (ANCEF ) IV 2 g (08/22/23 1404)   PRN Meds:.acetaminophen , bisacodyl , diphenhydrAMINE , HYDROmorphone  (DILAUDID ) injection, HYDROmorphone , magnesium  citrate, methocarbamol  **OR** methocarbamol  (ROBAXIN ) injection, metoCLOPramide  **OR** metoCLOPramide  (REGLAN ) injection, ondansetron  **OR** ondansetron  (ZOFRAN ) IV, oxyCODONE , polyethylene glycol, sodium chloride  flush, zolpidem  Allergies  Allergen Reactions   Other Itching    PATCHES. Patient states that any patch on his skin causes him to itch     OBJECTIVE: Vitals:   08/21/23 2118 08/22/23 0510 08/22/23 0902 08/22/23 1358  BP: 132/72 130/64  112/63  Pulse: 83 70  67  Resp: 18 18  16   Temp: 99 F (37.2 C) 98.4 F (36.9 C)  98.6 F (37 C)  TempSrc: Oral Oral  Oral  SpO2: 100% 99%  99% 98%  Weight:      Height:       Body mass index is 24.13 kg/m.  Physical Exam Constitutional:      General: He is not in acute distress.    Appearance: He is normal weight. He is not toxic-appearing.  HENT:     Head: Normocephalic and atraumatic.     Right Ear: External ear normal.     Left Ear: External ear normal.     Nose: No congestion or rhinorrhea.     Mouth/Throat:     Mouth: Mucous membranes are moist.     Pharynx: Oropharynx is clear.   Eyes:     Extraocular Movements: Extraocular movements intact.     Conjunctiva/sclera: Conjunctivae normal.     Pupils: Pupils are equal,  round, and reactive to light.    Cardiovascular:     Rate and Rhythm: Normal rate and regular rhythm.     Heart sounds: No murmur heard.    No friction rub. No gallop.  Pulmonary:     Effort: Pulmonary effort is normal.     Breath sounds: Normal breath sounds.  Abdominal:     General: Abdomen is flat. Bowel sounds are normal.     Palpations: Abdomen is soft.   Musculoskeletal:        General: No swelling.     Cervical back: Normal range of motion and neck supple.     Comments: B/l hand swelling   Skin:    General: Skin is warm and dry.   Neurological:     General: No focal deficit present.     Mental Status: He is oriented to person, place, and time.   Psychiatric:        Mood and Affect: Mood normal.       Lab Results Lab Results  Component Value Date   WBC 10.8 (H) 08/22/2023   HGB 13.7 08/22/2023   HCT 41.5 08/22/2023   MCV 92.8 08/22/2023   PLT 390 08/22/2023    Lab Results  Component Value Date   CREATININE 0.75 08/22/2023   BUN 17 08/22/2023   NA 134 (L) 08/22/2023   K 4.1 08/22/2023   CL 100 08/22/2023   CO2 24 08/22/2023    Lab Results  Component Value Date   ALT 16 08/22/2023   AST 33 08/22/2023   ALKPHOS 50 08/22/2023   BILITOT 0.8 08/22/2023        Orlie Bjornstad, MD [facility] 08/22/2023, 3:34 PM

## 2023-08-23 ENCOUNTER — Encounter (HOSPITAL_COMMUNITY)
Admission: AD | Disposition: A | Discharge status: Home Health | Payer: Self-pay | Source: Ambulatory Visit | Attending: Orthopedic Surgery

## 2023-08-23 ENCOUNTER — Inpatient Hospital Stay (HOSPITAL_COMMUNITY): Admitting: Certified Registered Nurse Anesthetist

## 2023-08-23 ENCOUNTER — Inpatient Hospital Stay (HOSPITAL_COMMUNITY)

## 2023-08-23 DIAGNOSIS — M00062 Staphylococcal arthritis, left knee: Secondary | ICD-10-CM | POA: Diagnosis not present

## 2023-08-23 DIAGNOSIS — M7989 Other specified soft tissue disorders: Secondary | ICD-10-CM | POA: Diagnosis not present

## 2023-08-23 DIAGNOSIS — E43 Unspecified severe protein-calorie malnutrition: Secondary | ICD-10-CM | POA: Insufficient documentation

## 2023-08-23 DIAGNOSIS — B9561 Methicillin susceptible Staphylococcus aureus infection as the cause of diseases classified elsewhere: Secondary | ICD-10-CM | POA: Diagnosis not present

## 2023-08-23 LAB — COMPREHENSIVE METABOLIC PANEL WITH GFR
ALT: 14 U/L (ref 0–44)
AST: 30 U/L (ref 15–41)
Albumin: 2.5 g/dL — ABNORMAL LOW (ref 3.5–5.0)
Alkaline Phosphatase: 53 U/L (ref 38–126)
Anion gap: 9 (ref 5–15)
BUN: 22 mg/dL (ref 8–23)
CO2: 25 mmol/L (ref 22–32)
Calcium: 8.6 mg/dL — ABNORMAL LOW (ref 8.9–10.3)
Chloride: 96 mmol/L — ABNORMAL LOW (ref 98–111)
Creatinine, Ser: 0.78 mg/dL (ref 0.61–1.24)
GFR, Estimated: >60 mL/min (ref >60)
Glucose, Bld: 112 mg/dL — ABNORMAL HIGH (ref 70–99)
Potassium: 4.1 mmol/L (ref 3.5–5.1)
Sodium: 130 mmol/L — ABNORMAL LOW (ref 135–145)
Total Bilirubin: 0.4 mg/dL (ref 0.0–1.2)
Total Protein: 6 g/dL — ABNORMAL LOW (ref 6.5–8.1)

## 2023-08-23 LAB — CBC
HCT: 41 % (ref 39.0–52.0)
Hemoglobin: 13.5 g/dL (ref 13.0–17.0)
MCH: 31.3 pg (ref 26.0–34.0)
MCHC: 32.9 g/dL (ref 30.0–36.0)
MCV: 94.9 fL (ref 80.0–100.0)
Platelets: 415 10*3/uL — ABNORMAL HIGH (ref 150–400)
RBC: 4.32 MIL/uL (ref 4.22–5.81)
RDW: 12.7 % (ref 11.5–15.5)
WBC: 10.5 10*3/uL (ref 4.0–10.5)
nRBC: 0 % (ref 0.0–0.2)

## 2023-08-23 SURGERY — ARTHROSCOPY, KNEE
Anesthesia: General | Site: Knee | Laterality: Left

## 2023-08-23 MED ORDER — FENTANYL CITRATE (PF) 100 MCG/2ML IJ SOLN
INTRAMUSCULAR | Status: AC
Start: 2023-08-23 — End: 2023-08-23
  Filled 2023-08-23: qty 2

## 2023-08-23 MED ORDER — PROPOFOL 10 MG/ML IV BOLUS
INTRAVENOUS | Status: AC
Start: 2023-08-23 — End: 2023-08-23
  Filled 2023-08-23: qty 20

## 2023-08-23 NOTE — Progress Notes (Signed)
 Subjective: Patient reports pain as moderate in knee. Pain in arms not as severe today. Doppler ultrasounds done that show DVT in RUE an SVT in LUE. Now having right ankle pain, redness, and swelling.  Still not eating much.  Urinating.   No CP, SOB.  Has mobilized OOB with PT.  Drain removed yesterday.  Objective:   VITALS:   Vitals:   08/22/23 1358 08/22/23 2056 08/23/23 0454 08/23/23 0840  BP: 112/63 (!) 147/75 113/66   Pulse: 67 77 71   Resp: 16 18 18    Temp: 98.6 F (37 C) 98 F (36.7 C) 98.2 F (36.8 C)   TempSrc: Oral Oral Oral   SpO2: 98% 100% 97% 99%  Weight:      Height:          Latest Ref Rng & Units 08/23/2023    5:00 AM 08/22/2023    1:12 PM 08/21/2023    3:33 AM  CBC  WBC 4.0 - 10.5 K/uL 10.5  10.8  11.1   Hemoglobin 13.0 - 17.0 g/dL 16.1  09.6  04.5   Hematocrit 39.0 - 52.0 % 41.0  41.5  37.4   Platelets 150 - 400 K/uL 415  390  322       Latest Ref Rng & Units 08/23/2023    5:00 AM 08/22/2023    1:12 PM 08/21/2023    3:33 AM  BMP  Glucose 70 - 99 mg/dL 409  99  811   BUN 8 - 23 mg/dL 22  17  22    Creatinine 0.61 - 1.24 mg/dL 9.14  7.82  9.56   Sodium 135 - 145 mmol/L 130  134  135   Potassium 3.5 - 5.1 mmol/L 4.1  4.1  3.6   Chloride 98 - 111 mmol/L 96  100  103   CO2 22 - 32 mmol/L 25  24  24    Calcium  8.9 - 10.3 mg/dL 8.6  8.4  7.9    Intake/Output      06/12 0701 06/13 0700 06/13 0701 06/14 0700   P.O. 480    Other     IV Piggyback 400    Total Intake(mL/kg) 880 (10.9)    Urine (mL/kg/hr) 1675 (0.9)    Stool 0    Total Output 1675    Net -795         Urine Occurrence 1 x    Stool Occurrence 0 x       Physical Exam: General: NAD.  Sitting up in bed, calm, looks very tired Resp: No increased wob Cardio: regular rate and rhythm ABD soft Neurologically intact MSK Neurovascularly intact Sensation intact distally Intact pulses distally Dorsiflexion/Plantar flexion intact Incision: dressing C/D/I Can do SLR Compartments  soft R ankle TTP, mild edema and erythema, painful ROM  Assessment: 4 Days Post-Op  S/P Procedure(s) (LRB): IRRIGATION AND DEBRIDEMENT KNEE (Left) by Dr. Deloras Fess. Abigail Abler on 08/19/23  Principal Problem:   Septic joint of left knee joint (HCC) Active Problems:   Dyslipidemia   Anxiety disorder   Depression   Hypertension   GERD (gastroesophageal reflux disease)   Coronary atherosclerosis   MSSA bacteremia   Paroxysmal atrial fibrillation (HCC)   DVT (deep venous thrombosis) (HCC)   Plan: Will continue to monitor specimen. Showing Staph aureus so far  Will hold off on doing repeat left knee I&D today since knee feeling better.   Ordered B/L leg Doppler U/S scans to rule out DVT as cause of new  right ankle symptoms.    Advance diet Up with therapy Incentive Spirometry Elevate and Apply ice  Weightbearing: WBAT LLE in knee brace Insicional and dressing care: Dressings left intact until follow-up and Reinforce dressings as needed Orthopedic device(s): knee brace Showering: Keep dressing dry VTE prophylaxis: Lovenox  80mg  bid while inpatient, SCDs, ambulation Pain control: PRN  Contact information:  Randal Bury MD, [address] PA-C   [place], [place] Office [phone] 08/23/2023, 11:03 AM
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
## 2023-08-23 NOTE — Progress Notes (Signed)
 VAST consult received inquiring about removal of RA PICC and replacement in LA due to R axillary DVT. Discussed issue with Dr. Augustus Ledger via SecureChat. Educated that per best practice the treatment of upper extremity DVTs related to PICC/CVC involves the PICC remaining in place if functional and clinically necessary. Anticoagulation with low molecular weight heparin  or warfarin for a minimum of 3 months is recommended. If the line is not needed, it should be removed only after 3 to 5 days of anticoagulation therapy. It is NOT recommended to place another PICC in the opposite extremity as the patient would be at  risk for DVT formation with new PICC placement.

## 2023-08-23 NOTE — Progress Notes (Signed)
 VASCULAR LAB    Bilateral lower extremity venous duplex has been performed.  See CV proc for preliminary results.   Kharisma Glasner, RVT 08/23/2023, 5:59 PM

## 2023-08-23 NOTE — Plan of Care (Signed)
   Problem: Coping: Goal: Level of anxiety will decrease Outcome: Progressing   Problem: Pain Managment: Goal: General experience of comfort will improve and/or be controlled Outcome: Progressing   Problem: Safety: Goal: Ability to remain free from injury will improve Outcome: Progressing

## 2023-08-23 NOTE — Plan of Care (Signed)
   Problem: Health Behavior/Discharge Planning: Goal: Ability to manage health-related needs will improve Outcome: Progressing   Problem: Clinical Measurements: Goal: Ability to maintain clinical measurements within normal limits will improve Outcome: Progressing Goal: Will remain free from infection Outcome: Progressing

## 2023-08-23 NOTE — Progress Notes (Signed)
 [facility]  Date of Admission:  08/19/2023   Total days of inpatient antibiotics 3  Principal Problem:   Septic joint of left knee joint (HCC) Active Problems:   Dyslipidemia   Anxiety disorder   Depression   Hypertension   GERD (gastroesophageal reflux disease)   Coronary atherosclerosis   MSSA bacteremia   Paroxysmal atrial fibrillation (HCC)   DVT (deep venous thrombosis) (HCC)   Protein-calorie malnutrition, severe          Assessment: 78 year old male with history of MSSA bacteremia, left knee septic arthritis with MSSA on cefazolin  x 4 weeks readmitted for left knee I&D. #Left knee septic arthritis status post I&D on 6/9 WITH cX+ mssa - Patient had initially admitted for MSSA bacteremia found to have left septic knee blood cultures cleared on 6/4.  He was taken to the OR on 6/4 for I&D of deep abscess of knee.  ID was engaged and patient discharged on cefazolin  x 4 weeks as TEE did not show vegetation. - Patient reports that he went home on Saturday and then knee pain worsened.  He was seen by orthopedics on Monday and taken to the OR on 6/9 for debridement.  Per OR note fluid collection was in the knee joint as well.   Recommendations:  - Continue cefazolin . - Reset the clock on 4 weeks antibiotics from OR, EOT 7/6 -OR deferred given since left knee is feeling better -I'll place OPAT orders in case pt discharged over the weekend.   #DVT #R PICC -Found to have R axillary DVT -IV team reviewing PICC line -Switched to DOAC -Internal med engaged Plan: - Superficial thrombus in left upper extremity.  If possible take out right PICC, placed on left side pending IV team evaluation. -F/U le US . He has right dorsal foot pain  Dr. Levern Reader is covering the weekend. Ill be back on service on Monday  Evaluation of this patient requires complex antimicrobial therapy evaluation and counseling + isolation needs for disease transmission risk  assessment and mitigation   OPAT ORDERS:  Diagnosis: Left knee septic arthritis  Allergies  Allergen Reactions   Other Itching    PATCHES. Patient states that any patch on his skin causes him to itch      Discharge antibiotics to be given via PICC line:  Per pharmacy protocol cefazolin  2gm q8h   Duration:  4 weeks End Date: 09/11/23  [facility] Care Per Protocol with [employer] Use: Home health RN for IV administration and teaching, line care and labs.    Labs weekly while on IV antibiotics: __ CBC with differential __ BMP **TWICE WEEKLY ON VANCOMYCIN   __ CMP __ CRP __ ESR __ Vancomycin  trough TWICE WEEKLY __ CK  __ Please pull PIC at completion of IV antibiotics __ Please leave PIC in place until doctor has seen patient or been notified  Fax weekly labs to [phone]  Clinic Follow Up Appt: 7/2  @ [facility] with Marlan Silva  Microbiology:   Antibiotics: Cefazolin  6/2-present   Cultures: Blood 6/22/2 MSSA 6/4 no growth  6/9 OR Cx stpah auresu SUBJECTIVE: Resting in bed. Wife at bedside Interval: afebrile overnight  Review of Systems: Review of Systems  All other systems reviewed and are negative.    Scheduled Meds:  atorvastatin   10 mg Oral Daily   buPROPion   150 mg Oral Daily   Chlorhexidine  Gluconate Cloth  6 each Topical Daily   cholecalciferol   1,000 Units  Oral Daily   docusate sodium   100 mg Oral BID   enoxaparin  (LOVENOX ) injection  80 mg Subcutaneous Q12H   finasteride   5 mg Oral Daily   fluticasone  furoate-vilanterol  1 puff Inhalation Daily   irbesartan   150 mg Oral Daily   metoprolol  tartrate  25 mg Oral BID   multivitamin with minerals  1 tablet Oral Daily   mupirocin  ointment  1 Application Nasal BID   nutrition supplement (JUVEN)  1 packet Oral BID BM   oxyCODONE   10 mg Oral Q12H   pantoprazole   40 mg Oral Daily   sodium chloride  flush  10-40 mL Intracatheter Q12H   triamterene -hydrochlorothiazide   1 tablet Oral Daily   Continuous  Infusions:   ceFAZolin  (ANCEF ) IV Stopped (08/23/23 0453)   PRN Meds:.acetaminophen , bisacodyl , diphenhydrAMINE , HYDROmorphone  (DILAUDID ) injection, HYDROmorphone , magnesium  citrate, methocarbamol  **OR** methocarbamol  (ROBAXIN ) injection, metoCLOPramide  **OR** metoCLOPramide  (REGLAN ) injection, ondansetron  **OR** ondansetron  (ZOFRAN ) IV, oxyCODONE , polyethylene glycol, sodium chloride  flush, zolpidem  Allergies  Allergen Reactions   Other Itching    PATCHES. Patient states that any patch on his skin causes him to itch     OBJECTIVE: Vitals:   08/22/23 2056 08/23/23 0454 08/23/23 0840 08/23/23 1420  BP: (!) 147/75 113/66  (!) 96/56  Pulse: 77 71  76  Resp: 18 18  17   Temp: 98 F (36.7 C) 98.2 F (36.8 C)  97.7 F (36.5 C)  TempSrc: Oral Oral    SpO2: 100% 97% 99% 96%  Weight:      Height:       Body mass index is 24.13 kg/m.  Physical Exam Constitutional:      General: He is not in acute distress.    Appearance: He is normal weight. He is not toxic-appearing.  HENT:     Head: Normocephalic and atraumatic.     Right Ear: External ear normal.     Left Ear: External ear normal.     Nose: No congestion or rhinorrhea.     Mouth/Throat:     Mouth: Mucous membranes are moist.     Pharynx: Oropharynx is clear.   Eyes:     Extraocular Movements: Extraocular movements intact.     Conjunctiva/sclera: Conjunctivae normal.     Pupils: Pupils are equal, round, and reactive to light.    Cardiovascular:     Rate and Rhythm: Normal rate and regular rhythm.     Heart sounds: No murmur heard.    No friction rub. No gallop.  Pulmonary:     Effort: Pulmonary effort is normal.     Breath sounds: Normal breath sounds.  Abdominal:     General: Abdomen is flat. Bowel sounds are normal.     Palpations: Abdomen is soft.   Musculoskeletal:        General: No swelling.     Cervical back: Normal range of motion and neck supple.     Comments: B/l hand swelling   Skin:    General: Skin  is warm and dry.   Neurological:     General: No focal deficit present.     Mental Status: He is oriented to person, place, and time.   Psychiatric:        Mood and Affect: Mood normal.       Lab Results Lab Results  Component Value Date   WBC 10.5 08/23/2023   HGB 13.5 08/23/2023   HCT 41.0 08/23/2023   MCV 94.9 08/23/2023   PLT 415 (H) 08/23/2023    Lab Results  Component Value Date   CREATININE 0.78 08/23/2023   BUN 22 08/23/2023   NA 130 (L) 08/23/2023   K 4.1 08/23/2023   CL 96 (L) 08/23/2023   CO2 25 08/23/2023    Lab Results  Component Value Date   ALT 14 08/23/2023   AST 30 08/23/2023   ALKPHOS 53 08/23/2023   BILITOT 0.4 08/23/2023        Orlie Bjornstad, MD [facility] 08/23/2023, 2:26 PM

## 2023-08-24 DIAGNOSIS — M00062 Staphylococcal arthritis, left knee: Secondary | ICD-10-CM | POA: Diagnosis not present

## 2023-08-24 NOTE — Plan of Care (Signed)
   Problem: Coping: Goal: Level of anxiety will decrease Outcome: Progressing   Problem: Pain Managment: Goal: General experience of comfort will improve and/or be controlled Outcome: Progressing   Problem: Safety: Goal: Ability to remain free from injury will improve Outcome: Progressing
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
## 2023-08-24 NOTE — Progress Notes (Signed)
 5 Days Post-Op Procedure(s) (LRB): IRRIGATION AND DEBRIDEMENT KNEE (Left)   Subjective: Patient reports pain as still focused in the knee though with some improvement from yesterday. Pain in arms not as severe today. Doppler ultrasounds done that show DVT in RUE an SVT in LUE.  Ankle feels better than yesterday.  Still not eating much.  Urinating.   Afebrile.  WBC 10.5 yesterday.  No overnight events.  No CP, SOB.  Mobilized some with PT.  Drain removed Thursday.  Objective:   VITALS:   Vitals:   08/23/23 1456 08/23/23 2113 08/24/23 0602 08/24/23 1004  BP: 132/66 111/73 119/72   Pulse:  88 64   Resp:  15 16   Temp:  98.3 F (36.8 C) 97.8 F (36.6 C)   TempSrc:  Oral Oral   SpO2:  100% 96% 99%  Weight:      Height:          Latest Ref Rng & Units 08/23/2023    5:00 AM 08/22/2023    1:12 PM 08/21/2023    3:33 AM  CBC  WBC 4.0 - 10.5 K/uL 10.5  10.8  11.1   Hemoglobin 13.0 - 17.0 g/dL 16.1  09.6  04.5   Hematocrit 39.0 - 52.0 % 41.0  41.5  37.4   Platelets 150 - 400 K/uL 415  390  322       Latest Ref Rng & Units 08/23/2023    5:00 AM 08/22/2023    1:12 PM 08/21/2023    3:33 AM  BMP  Glucose 70 - 99 mg/dL 409  99  811   BUN 8 - 23 mg/dL 22  17  22    Creatinine 0.61 - 1.24 mg/dL 9.14  7.82  9.56   Sodium 135 - 145 mmol/L 130  134  135   Potassium 3.5 - 5.1 mmol/L 4.1  4.1  3.6   Chloride 98 - 111 mmol/L 96  100  103   CO2 22 - 32 mmol/L 25  24  24    Calcium  8.9 - 10.3 mg/dL 8.6  8.4  7.9    Intake/Output      06/13 0701 06/14 0700 06/14 0701 06/15 0700   P.O. 960    IV Piggyback 212.4    Total Intake(mL/kg) 1172.4 (14.5)    Urine (mL/kg/hr) 800 (0.4)    Stool     Total Output 800    Net +372.4         Urine Occurrence 2 x    Stool Occurrence 1 x    Emesis Occurrence 0 x       Physical Exam: General: NAD.  Sitting up in bed, calm, looks very tired Resp: No increased wob Cardio: regular rate and rhythm ABD soft Neurologically  intact MSK Neurovascularly intact Sensation intact distally Intact pulses distally Dorsiflexion/Plantar flexion intact Incision: dressing C/D/I Can do SLR Compartments soft R ankle TTP, mild edema and erythema, painful ROM  Assessment: 5 Days Post-Op  S/P Procedure(s) (LRB): IRRIGATION AND DEBRIDEMENT KNEE (Left) by Dr. Deloras Fess. Abigail Abler on 08/19/23  Principal Problem:   Septic joint of left knee joint (HCC) Active Problems:   Dyslipidemia   Anxiety disorder   Depression   Hypertension   GERD (gastroesophageal reflux disease)   Coronary atherosclerosis   MSSA bacteremia   Paroxysmal atrial fibrillation (HCC)   DVT (deep venous thrombosis) (HCC)   Protein-calorie malnutrition, severe   Plan: Will continue to monitor specimen. Showing Staph aureus so far.  Will  again hold off on doing repeat left knee I&D today since knee feeling better.   B/L leg Doppler U/S scans negative for acute DVT yesterday.    Advance diet Up with therapy Incentive Spirometry Elevate and Apply ice  Weightbearing: WBAT LLE in knee brace Insicional and dressing care: Dressings left intact until follow-up and Reinforce dressings as needed Orthopedic device(s): knee brace Showering: Keep dressing dry VTE prophylaxis: Lovenox  80mg  bid while inpatient, SCDs, ambulation Pain control: PRN  Contact information:  Randal Bury MD, [address] PA-C   [place], [place] Office [phone] 08/24/2023, 12:02 PM

## 2023-08-24 NOTE — Plan of Care (Signed)

## 2023-08-25 DIAGNOSIS — M00062 Staphylococcal arthritis, left knee: Secondary | ICD-10-CM | POA: Diagnosis not present

## 2023-08-25 LAB — BASIC METABOLIC PANEL WITH GFR
Anion gap: 9 (ref 5–15)
BUN: 21 mg/dL (ref 8–23)
CO2: 24 mmol/L (ref 22–32)
Calcium: 8.6 mg/dL — ABNORMAL LOW (ref 8.9–10.3)
Chloride: 99 mmol/L (ref 98–111)
Creatinine, Ser: 0.78 mg/dL (ref 0.61–1.24)
GFR, Estimated: >60 mL/min (ref >60)
Glucose, Bld: 135 mg/dL — ABNORMAL HIGH (ref 70–99)
Potassium: 4.1 mmol/L (ref 3.5–5.1)
Sodium: 132 mmol/L — ABNORMAL LOW (ref 135–145)

## 2023-08-25 LAB — CBC WITH DIFFERENTIAL/PLATELET
Abs Immature Granulocytes: 0.19 10*3/uL — ABNORMAL HIGH (ref 0.00–0.07)
Basophils Absolute: 0.1 10*3/uL (ref 0.0–0.1)
Basophils Relative: 1 %
Eosinophils Absolute: 0.1 10*3/uL (ref 0.0–0.5)
Eosinophils Relative: 1 %
HCT: 39.8 % (ref 39.0–52.0)
Hemoglobin: 13.2 g/dL (ref 13.0–17.0)
Immature Granulocytes: 2 %
Lymphocytes Relative: 10 %
Lymphs Abs: 1.1 10*3/uL (ref 0.7–4.0)
MCH: 30.9 pg (ref 26.0–34.0)
MCHC: 33.2 g/dL (ref 30.0–36.0)
MCV: 93.2 fL (ref 80.0–100.0)
Monocytes Absolute: 1.3 10*3/uL — ABNORMAL HIGH (ref 0.1–1.0)
Monocytes Relative: 12 %
Neutro Abs: 8.2 10*3/uL — ABNORMAL HIGH (ref 1.7–7.7)
Neutrophils Relative %: 74 %
Platelets: 448 10*3/uL — ABNORMAL HIGH (ref 150–400)
RBC: 4.27 MIL/uL (ref 4.22–5.81)
RDW: 12.6 % (ref 11.5–15.5)
WBC: 11 10*3/uL — ABNORMAL HIGH (ref 4.0–10.5)
nRBC: 0 % (ref 0.0–0.2)

## 2023-08-25 LAB — CULTURE, BLOOD (ROUTINE X 2)
Culture: NO GROWTH
Culture: NO GROWTH
Special Requests: ADEQUATE
Special Requests: ADEQUATE

## 2023-08-25 LAB — AEROBIC/ANAEROBIC CULTURE W GRAM STAIN (SURGICAL/DEEP WOUND)

## 2023-08-25 NOTE — Plan of Care (Signed)
   Problem: Coping: Goal: Level of anxiety will decrease Outcome: Progressing   Problem: Pain Managment: Goal: General experience of comfort will improve and/or be controlled Outcome: Progressing   Problem: Safety: Goal: Ability to remain free from injury will improve Outcome: Progressing
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
## 2023-08-25 NOTE — Progress Notes (Signed)
 6 Days Post-Op Procedure(s) (LRB): IRRIGATION AND DEBRIDEMENT KNEE (Left)   Subjective: Patient reports pain as same as yesterday. Pain in arms not as severe today. Doppler US  done that show DVT in RUE an SVT in LUE.  Ankle feels okay.  Eating breakfast.  Urinating.  Afebrile.  No overnight events.  No CP, SOB.  Walked 75 ft with PT yesterday.  Drain removed Thursday.  Objective:   VITALS:   Vitals:   08/24/23 1359 08/24/23 2025 08/24/23 2040 08/25/23 0458  BP: 115/65  137/67 (!) 143/70  Pulse: 64 72 77 71  Resp: 18  18 18   Temp: 97.9 F (36.6 C)  (!) 97.5 F (36.4 C) 98.2 F (36.8 C)  TempSrc: Oral  Oral   SpO2: 100%  100% 100%  Weight:      Height:          Latest Ref Rng & Units 08/23/2023    5:00 AM 08/22/2023    1:12 PM 08/21/2023    3:33 AM  CBC  WBC 4.0 - 10.5 K/uL 10.5  10.8  11.1   Hemoglobin 13.0 - 17.0 g/dL 81.1  91.4  78.2   Hematocrit 39.0 - 52.0 % 41.0  41.5  37.4   Platelets 150 - 400 K/uL 415  390  322       Latest Ref Rng & Units 08/23/2023    5:00 AM 08/22/2023    1:12 PM 08/21/2023    3:33 AM  BMP  Glucose 70 - 99 mg/dL 956  99  213   BUN 8 - 23 mg/dL 22  17  22    Creatinine 0.61 - 1.24 mg/dL 0.86  5.78  4.69   Sodium 135 - 145 mmol/L 130  134  135   Potassium 3.5 - 5.1 mmol/L 4.1  4.1  3.6   Chloride 98 - 111 mmol/L 96  100  103   CO2 22 - 32 mmol/L 25  24  24    Calcium  8.9 - 10.3 mg/dL 8.6  8.4  7.9    Intake/Output      06/14 0701 06/15 0700 06/15 0701 06/16 0700   P.O. 1600    IV Piggyback 287.7    Total Intake(mL/kg) 1887.7 (23.4)    Urine (mL/kg/hr) 750 (0.4)    Total Output 750    Net +1137.7            Physical Exam: General: NAD.  Sitting up in bed, calm, looks very tired Resp: No increased wob Cardio: regular rate and rhythm ABD soft Neurologically intact MSK Neurovascularly intact Sensation intact distally Intact pulses distally Dorsiflexion/Plantar flexion intact Incision: dressing C/D/I Can do  SLR Compartments soft R ankle minimally TTP, no significant swelling    Assessment: 6 Days Post-Op  S/P Procedure(s) (LRB): IRRIGATION AND DEBRIDEMENT KNEE (Left) by Dr. Deloras Fess. Abigail Abler on 08/19/23  Principal Problem:   Septic joint of left knee joint (HCC) Active Problems:   Dyslipidemia   Anxiety disorder   Depression   Hypertension   GERD (gastroesophageal reflux disease)   Coronary atherosclerosis   MSSA bacteremia   Paroxysmal atrial fibrillation (HCC)   DVT (deep venous thrombosis) (HCC)   Protein-calorie malnutrition, severe   Plan: Will continue to monitor specimen. Showing Staph aureus so far.  Blood cultures NGTD.  Will again hold off on doing repeat left knee I&D today since knee feeling better/no significant changes.   B/L leg Doppler U/S scans negative for acute DVT yesterday.    Advance  diet Up with therapy Incentive Spirometry Elevate and Apply ice  Weightbearing: WBAT LLE in knee brace Insicional and dressing care: Dressings left intact until follow-up and Reinforce dressings as needed Orthopedic device(s): knee brace Showering: Keep dressing dry VTE prophylaxis: Lovenox  80mg  bid while inpatient, SCDs, ambulation Pain control: PRN  Contact information:  Randal Bury MD, [address] PA-C   [place], [place] Office [phone] 08/25/2023, 9:12 AM

## 2023-08-26 ENCOUNTER — Inpatient Hospital Stay (HOSPITAL_COMMUNITY)

## 2023-08-26 DIAGNOSIS — R7881 Bacteremia: Secondary | ICD-10-CM | POA: Diagnosis not present

## 2023-08-26 DIAGNOSIS — B9561 Methicillin susceptible Staphylococcus aureus infection as the cause of diseases classified elsewhere: Secondary | ICD-10-CM | POA: Diagnosis not present

## 2023-08-26 DIAGNOSIS — M00062 Staphylococcal arthritis, left knee: Secondary | ICD-10-CM | POA: Diagnosis not present

## 2023-08-26 LAB — CBC
HCT: 38.2 % — ABNORMAL LOW (ref 39.0–52.0)
Hemoglobin: 12.7 g/dL — ABNORMAL LOW (ref 13.0–17.0)
MCH: 31.2 pg (ref 26.0–34.0)
MCHC: 33.2 g/dL (ref 30.0–36.0)
MCV: 93.9 fL (ref 80.0–100.0)
Platelets: 447 10*3/uL — ABNORMAL HIGH (ref 150–400)
RBC: 4.07 MIL/uL — ABNORMAL LOW (ref 4.22–5.81)
RDW: 12.6 % (ref 11.5–15.5)
WBC: 9.3 10*3/uL (ref 4.0–10.5)
nRBC: 0 % (ref 0.0–0.2)

## 2023-08-26 LAB — CREATININE, SERUM
Creatinine, Ser: 0.86 mg/dL (ref 0.61–1.24)
GFR, Estimated: >60 mL/min (ref >60)

## 2023-08-26 MED ORDER — DIAZEPAM 5 MG PO TABS
5.0000 mg | ORAL_TABLET | Freq: Three times a day (TID) | ORAL | Status: DC | PRN
  Administered 2023-08-26 – 2023-09-03 (×6): 5 mg via ORAL
  Filled 2023-08-26 (×6): qty 1
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
## 2023-08-26 NOTE — Progress Notes (Signed)
 [facility]  Date of Admission:  08/19/2023   Total days of inpatient antibiotics 6  Principal Problem:   Septic joint of left knee joint (HCC) Active Problems:   Dyslipidemia   Anxiety disorder   Depression   Hypertension   GERD (gastroesophageal reflux disease)   Coronary atherosclerosis   MSSA bacteremia   Paroxysmal atrial fibrillation (HCC)   DVT (deep venous thrombosis) (HCC)   Protein-calorie malnutrition, severe          Assessment: 78 year old male with history of MSSA bacteremia, left knee septic arthritis with MSSA on cefazolin  x 4 weeks readmitted for left knee I&D. #Left knee septic arthritis status post I&D on 6/9 WITH cX+ mssa - Patient had initially admitted for MSSA bacteremia found to have left septic knee blood cultures cleared on 6/4.  He was taken to the OR on 6/4 for I&D of deep abscess of knee.  ID was engaged and patient discharged on cefazolin  x 4 weeks as TEE did not show vegetation. - Patient reports that he went home on Saturday and then knee pain worsened.  He was seen by orthopedics on Monday and taken to the OR on 6/9 for debridement.  Per OR note fluid collection was in the knee joint as well.   Recommendations:  - Continue cefazolin . - Reset the clock on 4 weeks antibiotics from OR, EOT 7/6 -OR deferred given since left knee is feeling better -OPAT orders as before. - Relayed to primary in regards to antibiotic plan.   #DVT #R PICC -Found to have R axillary DVT.  Lower extremity ultrasound, no DVT -IV team reviewing PICC line -Switched to DOAC -Internal med engaged Plan: - Superficial thrombus in left upper extremity.  Per IV team PICC line should remain in place as it is functional, clinically necessary., but anticoagulant x 3 months recommended.  If not needed the line should be removed moved 3 to 5 days.  Do not recommend placing another PICC in opposite extremity as the patient would be at risk for DVT  formation the new PICC line.  Evaluation of this patient requires complex antimicrobial therapy evaluation and counseling + isolation needs for disease transmission risk assessment and mitigation   Microbiology:   Antibiotics: Cefazolin  6/2-present   Cultures: Blood 6/22/2 MSSA 6/4 no growth  6/9 OR Cx stpah auresu SUBJECTIVE: Resting in bed.  Interval: afebrile overnight  Review of Systems: Review of Systems  All other systems reviewed and are negative.    Scheduled Meds:  atorvastatin   10 mg Oral Daily   buPROPion   150 mg Oral Daily   Chlorhexidine  Gluconate Cloth  6 each Topical Daily   cholecalciferol   1,000 Units Oral Daily   docusate sodium   100 mg Oral BID   enoxaparin  (LOVENOX ) injection  80 mg Subcutaneous Q12H   finasteride   5 mg Oral Daily   fluticasone  furoate-vilanterol  1 puff Inhalation Daily   irbesartan   150 mg Oral Daily   metoprolol  tartrate  25 mg Oral BID   multivitamin with minerals  1 tablet Oral Daily   mupirocin  ointment  1 Application Nasal BID   nutrition supplement (JUVEN)  1 packet Oral BID BM   oxyCODONE   10 mg Oral Q12H   pantoprazole   40 mg Oral Daily   sodium chloride  flush  10-40 mL Intracatheter Q12H   triamterene -hydrochlorothiazide   1 tablet Oral Daily   Continuous Infusions:   ceFAZolin  (ANCEF ) IV 2 g (08/26/23  8295)   PRN Meds:.acetaminophen , bisacodyl , diphenhydrAMINE , HYDROmorphone  (DILAUDID ) injection, HYDROmorphone , magnesium  citrate, methocarbamol  **OR** methocarbamol  (ROBAXIN ) injection, metoCLOPramide  **OR** metoCLOPramide  (REGLAN ) injection, ondansetron  **OR** ondansetron  (ZOFRAN ) IV, oxyCODONE , polyethylene glycol, sodium chloride  flush, zolpidem  Allergies  Allergen Reactions   Other Itching    PATCHES. Patient states that any patch on his skin causes him to itch     OBJECTIVE: Vitals:   08/25/23 2101 08/26/23 0554 08/26/23 0815 08/26/23 0817  BP: 118/72 (!) 144/87    Pulse: 75 74    Resp: 17 16    Temp: 98 F  (36.7 C) 98.2 F (36.8 C)    TempSrc: Oral Oral    SpO2: 100% 100% 98% 98%  Weight:      Height:       Body mass index is 24.13 kg/m.  Physical Exam Constitutional:      General: He is not in acute distress.    Appearance: He is normal weight. He is not toxic-appearing.  HENT:     Head: Normocephalic and atraumatic.     Right Ear: External ear normal.     Left Ear: External ear normal.     Nose: No congestion or rhinorrhea.     Mouth/Throat:     Mouth: Mucous membranes are moist.     Pharynx: Oropharynx is clear.   Eyes:     Extraocular Movements: Extraocular movements intact.     Conjunctiva/sclera: Conjunctivae normal.     Pupils: Pupils are equal, round, and reactive to light.    Cardiovascular:     Rate and Rhythm: Normal rate and regular rhythm.     Heart sounds: No murmur heard.    No friction rub. No gallop.  Pulmonary:     Effort: Pulmonary effort is normal.     Breath sounds: Normal breath sounds.  Abdominal:     General: Abdomen is flat. Bowel sounds are normal.     Palpations: Abdomen is soft.   Musculoskeletal:        General: No swelling.     Cervical back: Normal range of motion and neck supple.     Comments: B/l hand swelling   Skin:    General: Skin is warm and dry.   Neurological:     General: No focal deficit present.     Mental Status: He is oriented to person, place, and time.   Psychiatric:        Mood and Affect: Mood normal.       Lab Results Lab Results  Component Value Date   WBC 9.3 08/26/2023   HGB 12.7 (L) 08/26/2023   HCT 38.2 (L) 08/26/2023   MCV 93.9 08/26/2023   PLT 447 (H) 08/26/2023    Lab Results  Component Value Date   CREATININE 0.86 08/26/2023   BUN 21 08/25/2023   NA 132 (L) 08/25/2023   K 4.1 08/25/2023   CL 99 08/25/2023   CO2 24 08/25/2023    Lab Results  Component Value Date   ALT 14 08/23/2023   AST 30 08/23/2023   ALKPHOS 53 08/23/2023   BILITOT 0.4 08/23/2023        Orlie Bjornstad,  MD [facility] 08/26/2023, 12:58 PM

## 2023-08-26 NOTE — Progress Notes (Addendum)
 Subjective: Patient reports pain as severe in knee currently. Says he was dreaming a few hours ago that he was falling and jolted himself awake and his muscles were tense when he came to. The knee has had increased pain ever since then.   Arms much better now and hands appear back to normal.  Still not eating much.  Urinating.   No CP, SOB.  Has mobilized OOB with PT but limited by pain.  Reliving heavily on narcotics around the clock. On Oxycontin  10mg  bid and using 40-60mg  of additional Oxycodone  daily plus Dilaudid  2-5mg .    Objective:   VITALS:   Vitals:   08/26/23 0554 08/26/23 0815 08/26/23 0817 08/26/23 1313  BP: (!) 144/87   138/69  Pulse: 74   81  Resp: 16   17  Temp: 98.2 F (36.8 C)   98.4 F (36.9 C)  TempSrc: Oral   Oral  SpO2: 100% 98% 98% 99%  Weight:      Height:          Latest Ref Rng & Units 08/26/2023    3:36 AM 08/25/2023   10:56 AM 08/23/2023    5:00 AM  CBC  WBC 4.0 - 10.5 K/uL 9.3  11.0  10.5   Hemoglobin 13.0 - 17.0 g/dL 16.1  09.6  04.5   Hematocrit 39.0 - 52.0 % 38.2  39.8  41.0   Platelets 150 - 400 K/uL 447  448  415       Latest Ref Rng & Units 08/26/2023    3:36 AM 08/25/2023   10:56 AM 08/23/2023    5:00 AM  BMP  Glucose 70 - 99 mg/dL  409  811   BUN 8 - 23 mg/dL  21  22   Creatinine 9.14 - 1.24 mg/dL 7.82  9.56  2.13   Sodium 135 - 145 mmol/L  132  130   Potassium 3.5 - 5.1 mmol/L  4.1  4.1   Chloride 98 - 111 mmol/L  99  96   CO2 22 - 32 mmol/L  24  25   Calcium  8.9 - 10.3 mg/dL  8.6  8.6    Intake/Output      06/15 0701 06/16 0700 06/16 0701 06/17 0700   P.O.  440   IV Piggyback     Total Intake(mL/kg)  440 (5.5)   Urine (mL/kg/hr) 1625 (0.8) 400 (0.5)   Total Output 1625 400   Net -1625 +40           Physical Exam: General: NAD.  Sitting up in bed, calm, looks very tired and uncomfortable, in obvious pain Resp: No increased wob Cardio: regular rate and rhythm ABD soft Neurologically intact MSK Neurovascularly  intact Sensation intact distally Intact pulses distally Dorsiflexion/Plantar flexion painful Incision: dressing C/D/I Harder to do SLR Very large effusion to left knee that feels hard or semi-gelatinous like a hematoma. No active d/c. No fluid wave Erythema and ecchymosis to skin around incision actually looks slightly better Nylons in place  Assessment: 7 Days Post-Op  S/P Procedure(s) (LRB): IRRIGATION AND DEBRIDEMENT KNEE (Left) by Dr. Deloras Fess. Abigail Abler on 08/19/23  Principal Problem:   Septic joint of left knee joint (HCC) Active Problems:   Dyslipidemia   Anxiety disorder   Depression   Hypertension   GERD (gastroesophageal reflux disease)   Coronary atherosclerosis   MSSA bacteremia   Paroxysmal atrial fibrillation (HCC)   DVT (deep venous thrombosis) (HCC)   Protein-calorie malnutrition, severe  Plan: Will continue to monitor specimen. Showing Staph aureus so far  Will order left knee MRI to confirm hematoma vs abscess. May need to return to OR Thursday to evacuate hematoma due to large size.  Added Valium  5mg  q8h PRN to help with anxiety, restlessness, agitation he seems to be feeling. I think this will also help with his pain control.    Advance diet Up with therapy Incentive Spirometry Elevate and Apply ice  Weightbearing: WBAT LLE in knee brace Insicional and dressing care: Dressings left intact until follow-up and Reinforce dressings as needed Orthopedic device(s): knee brace Showering: Keep dressing dry VTE prophylaxis: Lovenox  80mg  bid while inpatient, SCDs, ambulation Pain control: PRN  Contact information:  Randal Bury MD, [address] PA-C   [place], [place] Office [phone] 08/26/2023, 6:01 PM
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
## 2023-08-26 NOTE — Plan of Care (Signed)
  Problem: Coping: Goal: Level of anxiety will decrease Outcome: Progressing   Problem: Pain Managment: Goal: General experience of comfort will improve and/or be controlled Outcome: Progressing   Problem: Elimination: Goal: Will not experience complications related to bowel motility Outcome: Progressing   Problem: Safety: Goal: Ability to remain free from injury will improve Outcome: Progressing

## 2023-08-26 NOTE — Plan of Care (Signed)
   Problem: Activity: Goal: Risk for activity intolerance will decrease Outcome: Progressing   Problem: Pain Managment: Goal: General experience of comfort will improve and/or be controlled Outcome: Progressing   Problem: Safety: Goal: Ability to remain free from injury will improve Outcome: Progressing
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
## 2023-08-27 ENCOUNTER — Encounter (HOSPITAL_COMMUNITY): Payer: Self-pay | Admitting: Orthopedic Surgery

## 2023-08-27 DIAGNOSIS — F419 Anxiety disorder, unspecified: Secondary | ICD-10-CM

## 2023-08-27 DIAGNOSIS — I82A11 Acute embolism and thrombosis of right axillary vein: Secondary | ICD-10-CM | POA: Diagnosis not present

## 2023-08-27 DIAGNOSIS — F32A Depression, unspecified: Secondary | ICD-10-CM

## 2023-08-27 DIAGNOSIS — M00062 Staphylococcal arthritis, left knee: Secondary | ICD-10-CM | POA: Diagnosis not present

## 2023-08-27 MED ORDER — LIDOCAINE HCL 1 % IJ SOLN
10.0000 mL | Freq: Once | INTRAMUSCULAR | Status: AC
  Administered 2023-08-27: 10 mL via INTRADERMAL
  Filled 2023-08-27: qty 10

## 2023-08-27 MED ORDER — GENTAMICIN SULFATE 40 MG/ML IJ SOLN
80.0000 mg | Freq: Once | INTRAMUSCULAR | Status: AC
  Administered 2023-08-27: 80 mg
  Filled 2023-08-27: qty 2
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
## 2023-08-27 NOTE — Progress Notes (Signed)
 Subjective: Patient reports pain as severe in knee currently.   Arms much better now and hands appear back to normal.  Still not eating much.  Urinating.   No CP, SOB.  Has mobilized OOB with PT but limited by pain.  Reliving heavily on narcotics around the clock.   Objective:   VITALS:   Vitals:   08/26/23 1313 08/26/23 1934 08/26/23 2139 08/27/23 0523  BP: 138/69  129/75 131/74  Pulse: 81 84 79 79  Resp: 17  18 17   Temp: 98.4 F (36.9 C)  97.9 F (36.6 C) 98 F (36.7 C)  TempSrc: Oral  Oral Oral  SpO2: 99%  100% 99%  Weight:      Height:          Latest Ref Rng & Units 08/26/2023    3:36 AM 08/25/2023   10:56 AM 08/23/2023    5:00 AM  CBC  WBC 4.0 - 10.5 K/uL 9.3  11.0  10.5   Hemoglobin 13.0 - 17.0 g/dL 52.8  41.3  24.4   Hematocrit 39.0 - 52.0 % 38.2  39.8  41.0   Platelets 150 - 400 K/uL 447  448  415       Latest Ref Rng & Units 08/26/2023    3:36 AM 08/25/2023   10:56 AM 08/23/2023    5:00 AM  BMP  Glucose 70 - 99 mg/dL  010  272   BUN 8 - 23 mg/dL  21  22   Creatinine 5.36 - 1.24 mg/dL 6.44  0.34  7.42   Sodium 135 - 145 mmol/L  132  130   Potassium 3.5 - 5.1 mmol/L  4.1  4.1   Chloride 98 - 111 mmol/L  99  96   CO2 22 - 32 mmol/L  24  25   Calcium  8.9 - 10.3 mg/dL  8.6  8.6    Intake/Output      06/16 0701 06/17 0700 06/17 0701 06/18 0700   P.O. 920 120   IV Piggyback 200    Total Intake(mL/kg) 1120 (13.9) 120 (1.5)   Urine (mL/kg/hr) 700 (0.4) 450 (1.4)   Total Output 700 450   Net +420 -330           Physical Exam: General: NAD.  Laying in bed, looks very tired and uncomfortable, in obvious pain Resp: No increased wob Cardio: regular rate and rhythm ABD soft Neurologically intact MSK Neurovascularly intact Sensation intact distally Intact pulses distally Dorsiflexion/Plantar flexion painful Incision: dressing C/D/I Harder to do SLR Very large effusion to left knee that feels hard or semi-gelatinous like a hematoma. No active  d/c. No fluid wave Erythema and ecchymosis to skin around incision actually looks slightly better Nylons in place  Assessment: 8 Days Post-Op  S/P Procedure(s) (LRB): IRRIGATION AND DEBRIDEMENT KNEE (Left) by Dr. Deloras Fess. Abigail Abler on 08/19/23  Principal Problem:   Septic joint of left knee joint (HCC) Active Problems:   Dyslipidemia   Anxiety disorder   Depression   Hypertension   GERD (gastroesophageal reflux disease)   Coronary atherosclerosis   MSSA bacteremia   Paroxysmal atrial fibrillation (HCC)   DVT (deep venous thrombosis) (HCC)   Protein-calorie malnutrition, severe   Plan: Will continue to monitor specimen. Showing Staph aureus so far  Left knee MRI still has no official read.  Aspirated around 70cc of dark red blood from the knee joint which gave him some slight relief. Still has large hematoma in subQ area of knee  so will plan to return to OR Thursday to remove that and then wash out knee joint arthroscopically. NPO at midnight Thursday.     Advance diet Up with therapy Incentive Spirometry Elevate and Apply ice  Weightbearing: WBAT LLE in knee brace Insicional and dressing care: Dressings left intact until follow-up and Reinforce dressings as needed Orthopedic device(s): knee brace Showering: Keep dressing dry VTE prophylaxis: Lovenox  80mg  bid while inpatient, SCDs, ambulation Pain control: PRN  Contact information:  Randal Bury MD, [address] PA-C   [place], [place] Office [phone] 08/27/2023, 11:04 AM

## 2023-08-27 NOTE — TOC Progression Note (Signed)
 Transition of Care (TOC) - Progression Note    Patient Details  Name: Paul [name]. MRN: [id number] Date of Birth: [birth year]  Transition of Care [facility]) CM/SW Contact  Bari Leys, RN Phone Number: 08/27/2023, 10:39 AM  Clinical Narrative:   Ortho progress note 6/16-Left knee MRI to confirm hematoma vs abscess. May need to return to OR Thursday to evacuate hematoma due to large size. TOC will continue to follow.     Expected Discharge Plan: Home w Home Health Services Barriers to Discharge: Continued Medical Work up  Expected Discharge Plan and Services       Living arrangements for the past 2 months: Single Family Home                                       Social Determinants of Health (SDOH) Interventions SDOH Screenings   Food Insecurity: No Food Insecurity (08/19/2023)  Housing: Low Risk  (08/19/2023)  Transportation Needs: No Transportation Needs (08/19/2023)  Utilities: Not At Risk (08/19/2023)  Depression (PHQ2-9): Low Risk  (04/16/2023)  Social Connections: Moderately Isolated (08/19/2023)  Tobacco Use: Medium Risk (08/27/2023)    Readmission Risk Interventions    08/20/2023   11:26 AM 05/01/2023    1:40 PM  Readmission Risk Prevention Plan  Transportation Screening Complete Complete  PCP or Specialist Appt within 5-7 Days  Complete  PCP or Specialist Appt within 3-5 Days Complete   Home Care Screening  Complete  Medication Review (RN CM)  Complete  HRI or Home Care Consult Complete   Social Work Consult for Recovery Care Planning/Counseling Complete   Palliative Care Screening Not Applicable   Medication Review [employer]) Complete

## 2023-08-27 NOTE — Plan of Care (Signed)
   Problem: Coping: Goal: Level of anxiety will decrease Outcome: Progressing   Problem: Pain Managment: Goal: General experience of comfort will improve and/or be controlled Outcome: Progressing   Problem: Safety: Goal: Ability to remain free from injury will improve Outcome: Progressing

## 2023-08-27 NOTE — Procedures (Signed)
 PATIENT:  Paul [name].   PRE-PROCEDURE DIAGNOSIS:  septic left knee joint POST-OPERATIVE DIAGNOSIS:  Same PROCEDURE:  Aspiration / Intra-articular injection left knee  PROCEDURE DETAILS:  After informed verbal consent was obtained the superolateral portal was prepped with chlorhexidine . Approximately 10 mL of 1% lidocaine  plain were injected superficially and then into the deeper tissues and into the joint space. Flash identified appropriate positioning and pressurized joint space. Medicine was left to sit for several minutes to take effect.   Next, a 30 mL syringe with an 18-gauge needle was used to aspirate approximately 70ml of bloody fluid.  2 ml of Gentamicin was then injected into the joint space. He tolerated this well without complication and a dry pressure dressing was placed.    Donya Tomaro Bernhard Brine PA-C 08/27/2023 11:06 AM
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
## 2023-08-28 DIAGNOSIS — I82A11 Acute embolism and thrombosis of right axillary vein: Secondary | ICD-10-CM | POA: Diagnosis not present

## 2023-08-28 DIAGNOSIS — M00062 Staphylococcal arthritis, left knee: Secondary | ICD-10-CM | POA: Diagnosis not present

## 2023-08-28 DIAGNOSIS — F419 Anxiety disorder, unspecified: Secondary | ICD-10-CM | POA: Diagnosis not present

## 2023-08-28 DIAGNOSIS — F32A Depression, unspecified: Secondary | ICD-10-CM | POA: Diagnosis not present

## 2023-08-28 MED ORDER — ENSURE MAX PROTEIN PO LIQD
11.0000 [oz_av] | Freq: Every day | ORAL | Status: DC
  Administered 2023-08-30 – 2023-09-05 (×6): 11 [oz_av] via ORAL
  Filled 2023-08-28 (×9): qty 330

## 2023-08-28 MED ORDER — ENOXAPARIN SODIUM 80 MG/0.8ML IJ SOSY: 80.0000 mg | PREFILLED_SYRINGE | Freq: Two times a day (BID) | INTRAMUSCULAR | Status: DC

## 2023-08-28 MED ORDER — JUVEN PO PACK
1.0000 | PACK | Freq: Two times a day (BID) | ORAL | Status: DC
  Administered 2023-08-28 – 2023-09-05 (×8): 1 via ORAL
  Filled 2023-08-28 (×14): qty 1

## 2023-08-28 MED ORDER — ENOXAPARIN SODIUM 80 MG/0.8ML IJ SOSY
80.0000 mg | PREFILLED_SYRINGE | Freq: Two times a day (BID) | INTRAMUSCULAR | Status: DC
  Administered 2023-08-30 (×2): 80 mg via SUBCUTANEOUS
  Filled 2023-08-28 (×3): qty 0.8

## 2023-08-28 NOTE — H&P (View-Only) (Signed)
 Subjective: Patient reports pain as severe in knee currently.   Arms much better now and hands appear back to normal.  Still not eating much.  Urinating.   No CP, SOB.  Has mobilized OOB with PT but limited by pain.  Reliving heavily on narcotics around the clock.  Left knee MRI shows  - Apparent recurrent tear of the distal quadriceps tendon which is poorly defined and mildly retracted. - Large complex knee joint effusion which has enlarged from the previous MRI. This joint effusion is complex with areas of T1 hyperintensity, suggesting hemorrhage or infection. There is probable superficial extension of fluid through a recurrent defect in the distal quadriceps tendon. - New marrow edema posteriorly in the medial tibial plateau which could be reactive or secondary to infection. No erosive changes are seen elsewhere. - New undersurface irregularity of the posterior horn of the medial meniscus   Objective:   VITALS:   Vitals:   08/27/23 2104 08/28/23 0607 08/28/23 0815 08/28/23 0836  BP: 104/61 117/66  126/76  Pulse: 85 91  90  Resp: 16 16    Temp: 98 F (36.7 C) 98.9 F (37.2 C)    TempSrc: Oral Oral    SpO2: 97% 95% 95%   Weight:      Height:          Latest Ref Rng & Units 08/26/2023    3:36 AM 08/25/2023   10:56 AM 08/23/2023    5:00 AM  CBC  WBC 4.0 - 10.5 K/uL 9.3  11.0  10.5   Hemoglobin 13.0 - 17.0 g/dL 40.9  81.1  91.4   Hematocrit 39.0 - 52.0 % 38.2  39.8  41.0   Platelets 150 - 400 K/uL 447  448  415       Latest Ref Rng & Units 08/26/2023    3:36 AM 08/25/2023   10:56 AM 08/23/2023    5:00 AM  BMP  Glucose 70 - 99 mg/dL  782  956   BUN 8 - 23 mg/dL  21  22   Creatinine 2.13 - 1.24 mg/dL 0.86  5.78  4.69   Sodium 135 - 145 mmol/L  132  130   Potassium 3.5 - 5.1 mmol/L  4.1  4.1   Chloride 98 - 111 mmol/L  99  96   CO2 22 - 32 mmol/L  24  25   Calcium  8.9 - 10.3 mg/dL  8.6  8.6    Intake/Output      06/17 0701 06/18 0700 06/18 0701 06/19 0700    P.O. 860 0   IV Piggyback 300    Total Intake(mL/kg) 1160 (14.4) 0 (0)   Urine (mL/kg/hr) 930 (0.5) 500 (1.1)   Total Output 930 500   Net +230 -500           Physical Exam: General: NAD.  Laying in bed, looks very tired and uncomfortable, in obvious pain Resp: No increased wob Cardio: regular rate and rhythm ABD soft Neurologically intact MSK Neurovascularly intact Sensation intact distally Intact pulses distally Dorsiflexion/Plantar flexion painful Incision: dressing C/D/I Harder to do SLR Very large effusion to left knee that feels hard or semi-gelatinous like a hematoma. No active d/c. No fluid wave Erythema and ecchymosis to skin around incision actually looks slightly better Nylons in place  Assessment: 9 Days Post-Op  S/P Procedure(s) (LRB): IRRIGATION AND DEBRIDEMENT KNEE (Left) by Dr. Deloras Fess. Seher Schlagel on 08/19/23  Principal Problem:   Septic joint of left knee  joint (HCC) Active Problems:   Dyslipidemia   Anxiety disorder   Depression   Hypertension   GERD (gastroesophageal reflux disease)   Coronary atherosclerosis   MSSA bacteremia   Paroxysmal atrial fibrillation (HCC)   DVT (deep venous thrombosis) (HCC)   Protein-calorie malnutrition, severe   Plan: Will continue to monitor specimen. Showing Staph aureus so far  Still has large hematoma in subQ area of knee so will plan to return to OR Thursday to remove that and then wash out knee joint arthroscopically. NPO at midnight Thursday.     Advance diet Up with therapy Incentive Spirometry Elevate and Apply ice  Weightbearing: WBAT LLE in knee brace Insicional and dressing care: Dressings left intact until follow-up and Reinforce dressings as needed Orthopedic device(s): knee brace Showering: Keep dressing dry VTE prophylaxis: Lovenox  80mg  bid while inpatient, SCDs, ambulation Pain control: PRN  Contact information:  Randal Bury MD, [address] PA-C   [place], [place] Office  [phone] 08/28/2023, 12:32 PM

## 2023-08-28 NOTE — Plan of Care (Signed)
  Problem: Health Behavior/Discharge Planning: Goal: Ability to manage health-related needs will improve Outcome: Progressing   Problem: Clinical Measurements: Goal: Ability to maintain clinical measurements within normal limits will improve Outcome: Progressing Goal: Will remain free from infection Outcome: Progressing Goal: Diagnostic test results will improve Outcome: Progressing Goal: Respiratory complications will improve Outcome: Progressing Goal: Cardiovascular complication will be avoided Outcome: Progressing   Problem: Activity: Goal: Risk for activity intolerance will decrease Outcome: Progressing   Problem: Nutrition: Goal: Adequate nutrition will be maintained Outcome: Progressing   Problem: Coping: Goal: Level of anxiety will decrease Outcome: Progressing   Problem: Elimination: Goal: Will not experience complications related to bowel motility Outcome: Progressing   Problem: Pain Managment: Goal: General experience of comfort will improve and/or be controlled Outcome: Progressing   Problem: Safety: Goal: Ability to remain free from injury will improve Outcome: Progressing

## 2023-08-28 NOTE — Progress Notes (Signed)
 PT Cancellation Note  Patient Details Name: Paul [name]. MRN: [id number] DOB: 04/09/1945   Cancelled Treatment:     Pt politely refused after experiencing excruciating L knee pain ambulating to the bathroom earlier. Pt states he is scheduled for surgery tomorrow afternoon. Will await new PT orders post procedure.    Diona Franklin 08/28/2023, 2:43 PM
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
## 2023-08-28 NOTE — Plan of Care (Signed)
   Problem: Health Behavior/Discharge Planning: Goal: Ability to manage health-related needs will improve Outcome: Progressing   Problem: Clinical Measurements: Goal: Ability to maintain clinical measurements within normal limits will improve Outcome: Progressing Goal: Will remain free from infection Outcome: Progressing Goal: Diagnostic test results will improve Outcome: Progressing Goal: Respiratory complications will improve Outcome: Progressing

## 2023-08-28 NOTE — Progress Notes (Signed)
 Subjective: Patient reports pain as severe in knee currently.   Arms much better now and hands appear back to normal.  Still not eating much.  Urinating.   No CP, SOB.  Has mobilized OOB with PT but limited by pain.  Reliving heavily on narcotics around the clock.  Left knee MRI shows  - Apparent recurrent tear of the distal quadriceps tendon which is poorly defined and mildly retracted. - Large complex knee joint effusion which has enlarged from the previous MRI. This joint effusion is complex with areas of T1 hyperintensity, suggesting hemorrhage or infection. There is probable superficial extension of fluid through a recurrent defect in the distal quadriceps tendon. - New marrow edema posteriorly in the medial tibial plateau which could be reactive or secondary to infection. No erosive changes are seen elsewhere. - New undersurface irregularity of the posterior horn of the medial meniscus   Objective:   VITALS:   Vitals:   08/27/23 2104 08/28/23 0607 08/28/23 0815 08/28/23 0836  BP: 104/61 117/66  126/76  Pulse: 85 91  90  Resp: 16 16    Temp: 98 F (36.7 C) 98.9 F (37.2 C)    TempSrc: Oral Oral    SpO2: 97% 95% 95%   Weight:      Height:          Latest Ref Rng & Units 08/26/2023    3:36 AM 08/25/2023   10:56 AM 08/23/2023    5:00 AM  CBC  WBC 4.0 - 10.5 K/uL 9.3  11.0  10.5   Hemoglobin 13.0 - 17.0 g/dL 40.9  81.1  91.4   Hematocrit 39.0 - 52.0 % 38.2  39.8  41.0   Platelets 150 - 400 K/uL 447  448  415       Latest Ref Rng & Units 08/26/2023    3:36 AM 08/25/2023   10:56 AM 08/23/2023    5:00 AM  BMP  Glucose 70 - 99 mg/dL  782  956   BUN 8 - 23 mg/dL  21  22   Creatinine 2.13 - 1.24 mg/dL 0.86  5.78  4.69   Sodium 135 - 145 mmol/L  132  130   Potassium 3.5 - 5.1 mmol/L  4.1  4.1   Chloride 98 - 111 mmol/L  99  96   CO2 22 - 32 mmol/L  24  25   Calcium  8.9 - 10.3 mg/dL  8.6  8.6    Intake/Output      06/17 0701 06/18 0700 06/18 0701 06/19 0700    P.O. 860 0   IV Piggyback 300    Total Intake(mL/kg) 1160 (14.4) 0 (0)   Urine (mL/kg/hr) 930 (0.5) 500 (1.1)   Total Output 930 500   Net +230 -500           Physical Exam: General: NAD.  Laying in bed, looks very tired and uncomfortable, in obvious pain Resp: No increased wob Cardio: regular rate and rhythm ABD soft Neurologically intact MSK Neurovascularly intact Sensation intact distally Intact pulses distally Dorsiflexion/Plantar flexion painful Incision: dressing C/D/I Harder to do SLR Very large effusion to left knee that feels hard or semi-gelatinous like a hematoma. No active d/c. No fluid wave Erythema and ecchymosis to skin around incision actually looks slightly better Nylons in place  Assessment: 9 Days Post-Op  S/P Procedure(s) (LRB): IRRIGATION AND DEBRIDEMENT KNEE (Left) by Dr. Deloras [name]. Paul [name] on 08/19/23  Principal Problem:   Septic joint of left knee  joint (HCC) Active Problems:   Dyslipidemia   Anxiety disorder   Depression   Hypertension   GERD (gastroesophageal reflux disease)   Coronary atherosclerosis   MSSA bacteremia   Paroxysmal atrial fibrillation (HCC)   DVT (deep venous thrombosis) (HCC)   Protein-calorie malnutrition, severe   Plan: Will continue to monitor specimen. Showing Staph aureus so far  Still has large hematoma in subQ area of knee so will plan to return to OR Thursday to remove that and then wash out knee joint arthroscopically. NPO at midnight Thursday.     Advance diet Up with therapy Incentive Spirometry Elevate and Apply ice  Weightbearing: WBAT LLE in knee brace Insicional and dressing care: Dressings left intact until follow-up and Reinforce dressings as needed Orthopedic device(s): knee brace Showering: Keep dressing dry VTE prophylaxis: Lovenox  80mg  bid while inpatient, SCDs, ambulation Pain control: PRN  Contact information:  Randal Bury MD, [address] PA-C   [place], [place] Office  [phone] 08/28/2023, 12:32 PM
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
## 2023-08-29 ENCOUNTER — Inpatient Hospital Stay (HOSPITAL_COMMUNITY): Payer: Self-pay | Admitting: Anesthesiology

## 2023-08-29 ENCOUNTER — Other Ambulatory Visit: Payer: Self-pay

## 2023-08-29 ENCOUNTER — Encounter (HOSPITAL_COMMUNITY): Payer: Self-pay | Admitting: Orthopedic Surgery

## 2023-08-29 ENCOUNTER — Encounter (HOSPITAL_COMMUNITY)
Admission: AD | Disposition: A | Discharge status: Home Health | Payer: Self-pay | Source: Ambulatory Visit | Attending: Orthopedic Surgery

## 2023-08-29 DIAGNOSIS — I82A11 Acute embolism and thrombosis of right axillary vein: Secondary | ICD-10-CM | POA: Diagnosis not present

## 2023-08-29 DIAGNOSIS — M00062 Staphylococcal arthritis, left knee: Secondary | ICD-10-CM | POA: Diagnosis not present

## 2023-08-29 DIAGNOSIS — F419 Anxiety disorder, unspecified: Secondary | ICD-10-CM | POA: Diagnosis not present

## 2023-08-29 DIAGNOSIS — F32A Depression, unspecified: Secondary | ICD-10-CM | POA: Diagnosis not present

## 2023-08-29 HISTORY — PX: KNEE ARTHROSCOPY W/ DEBRIDEMENT: SHX1867

## 2023-08-29 LAB — CBC
HCT: 34.7 % — ABNORMAL LOW (ref 39.0–52.0)
Hemoglobin: 11.1 g/dL — ABNORMAL LOW (ref 13.0–17.0)
MCH: 30.5 pg (ref 26.0–34.0)
MCHC: 32 g/dL (ref 30.0–36.0)
MCV: 95.3 fL (ref 80.0–100.0)
Platelets: 488 10*3/uL — ABNORMAL HIGH (ref 150–400)
RBC: 3.64 MIL/uL — ABNORMAL LOW (ref 4.22–5.81)
RDW: 12.4 % (ref 11.5–15.5)
WBC: 8.3 10*3/uL (ref 4.0–10.5)
nRBC: 0 % (ref 0.0–0.2)

## 2023-08-29 SURGERY — ARTHROSCOPY, KNEE WITH DEBRIDEMENT
Anesthesia: General | Site: Knee | Laterality: Left

## 2023-08-29 MED ORDER — DOCUSATE SODIUM 100 MG PO CAPS: 100.0000 mg | ORAL_CAPSULE | Freq: Two times a day (BID) | ORAL | Status: DC

## 2023-08-29 MED ORDER — ACETAMINOPHEN 500 MG PO TABS: 1000.0000 mg | ORAL_TABLET | Freq: Once | ORAL | Status: DC

## 2023-08-29 MED ORDER — HYDROMORPHONE HCL 1 MG/ML IJ SOLN
INTRAMUSCULAR | Status: DC | PRN
  Administered 2023-08-29 (×2): 1 mg via INTRAVENOUS

## 2023-08-29 MED ORDER — SODIUM CHLORIDE 0.9 % IR SOLN
Status: DC | PRN
  Administered 2023-08-29: 3000 mL

## 2023-08-29 MED ORDER — DEXAMETHASONE SODIUM PHOSPHATE 10 MG/ML IJ SOLN
INTRAMUSCULAR | Status: AC
Start: 2023-08-29 — End: 2023-08-29
  Filled 2023-08-29: qty 1

## 2023-08-29 MED ORDER — METOCLOPRAMIDE HCL 5 MG/ML IJ SOLN: 5.0000 mg | Freq: Three times a day (TID) | INTRAMUSCULAR | Status: DC | PRN

## 2023-08-29 MED ORDER — DEXAMETHASONE SODIUM PHOSPHATE 10 MG/ML IJ SOLN
INTRAMUSCULAR | Status: AC
  Filled 2023-08-29: qty 1

## 2023-08-29 MED ORDER — PHENYLEPHRINE HCL (PRESSORS) 10 MG/ML IV SOLN: INTRAVENOUS | Status: DC | PRN

## 2023-08-29 MED ORDER — CHLORHEXIDINE GLUCONATE 0.12 % MT SOLN: 15.0000 mL | Freq: Once | OROMUCOSAL | Status: AC

## 2023-08-29 MED ORDER — PROPOFOL 10 MG/ML IV BOLUS
INTRAVENOUS | Status: AC
  Filled 2023-08-29: qty 20

## 2023-08-29 MED ORDER — ONDANSETRON HCL 4 MG/2ML IJ SOLN: 4.0000 mg | Freq: Four times a day (QID) | INTRAMUSCULAR | Status: DC | PRN

## 2023-08-29 MED ORDER — ONDANSETRON HCL 4 MG PO TABS: 4.0000 mg | ORAL_TABLET | Freq: Four times a day (QID) | ORAL | Status: DC | PRN

## 2023-08-29 MED ORDER — FENTANYL CITRATE PF 50 MCG/ML IJ SOSY: 25.0000 ug | PREFILLED_SYRINGE | INTRAMUSCULAR | Status: DC | PRN

## 2023-08-29 MED ORDER — METHYLPREDNISOLONE ACETATE 40 MG/ML IJ SUSP
INTRAMUSCULAR | Status: AC
Start: 2023-08-29 — End: 2023-08-29
  Filled 2023-08-29: qty 2

## 2023-08-29 MED ORDER — ACETAMINOPHEN 500 MG PO TABS
1000.0000 mg | ORAL_TABLET | Freq: Once | ORAL | Status: AC
  Filled 2023-08-29 (×2): qty 2

## 2023-08-29 MED ORDER — LIDOCAINE HCL (CARDIAC) PF 100 MG/5ML IV SOSY
PREFILLED_SYRINGE | INTRAVENOUS | Status: DC | PRN
  Administered 2023-08-29: 60 mg via INTRAVENOUS

## 2023-08-29 MED ORDER — FENTANYL CITRATE (PF) 100 MCG/2ML IJ SOLN
INTRAMUSCULAR | Status: AC
  Filled 2023-08-29: qty 2

## 2023-08-29 MED ORDER — LACTATED RINGERS IV SOLN: INTRAVENOUS | Status: DC

## 2023-08-29 MED ORDER — ONDANSETRON HCL 4 MG/2ML IJ SOLN
INTRAMUSCULAR | Status: AC
  Filled 2023-08-29: qty 2

## 2023-08-29 MED ORDER — BUPIVACAINE-EPINEPHRINE (PF) 0.25% -1:200000 IJ SOLN
INTRAMUSCULAR | Status: AC
  Filled 2023-08-29: qty 30

## 2023-08-29 MED ORDER — PHENYLEPHRINE 80 MCG/ML (10ML) SYRINGE FOR IV PUSH (FOR BLOOD PRESSURE SUPPORT)
PREFILLED_SYRINGE | INTRAVENOUS | Status: DC | PRN
  Administered 2023-08-29: 80 ug via INTRAVENOUS

## 2023-08-29 MED ORDER — GENTAMICIN SULFATE 40 MG/ML IJ SOLN
80.0000 mg | Freq: Once | INTRAMUSCULAR | Status: AC
  Administered 2023-08-29: 80 mg
  Filled 2023-08-29: qty 2

## 2023-08-29 MED ORDER — POVIDONE-IODINE 10 % EX SWAB: 2.0000 | Freq: Once | CUTANEOUS | Status: DC

## 2023-08-29 MED ORDER — DEXMEDETOMIDINE HCL IN NACL 80 MCG/20ML IV SOLN
INTRAVENOUS | Status: AC
  Filled 2023-08-29: qty 20

## 2023-08-29 MED ORDER — ENSURE PRE-SURGERY PO LIQD
296.0000 mL | Freq: Once | ORAL | Status: AC
  Filled 2023-08-29: qty 296

## 2023-08-29 MED ORDER — DEXAMETHASONE SODIUM PHOSPHATE 10 MG/ML IJ SOLN
INTRAMUSCULAR | Status: DC | PRN
  Administered 2023-08-29: 5 mg via INTRAVENOUS

## 2023-08-29 MED ORDER — ONDANSETRON HCL 4 MG/2ML IJ SOLN
INTRAMUSCULAR | Status: DC | PRN
  Administered 2023-08-29: 4 mg via INTRAVENOUS

## 2023-08-29 MED ORDER — FENTANYL CITRATE PF 50 MCG/ML IJ SOSY
PREFILLED_SYRINGE | INTRAMUSCULAR | Status: AC
  Filled 2023-08-29: qty 2

## 2023-08-29 MED ORDER — LIDOCAINE HCL (PF) 2 % IJ SOLN
INTRAMUSCULAR | Status: AC
  Filled 2023-08-29: qty 5

## 2023-08-29 MED ORDER — ONDANSETRON HCL 4 MG/2ML IJ SOLN: 4.0000 mg | Freq: Once | INTRAMUSCULAR | Status: DC | PRN

## 2023-08-29 MED ORDER — HYDROMORPHONE HCL 2 MG/ML IJ SOLN
INTRAMUSCULAR | Status: AC
  Filled 2023-08-29: qty 1

## 2023-08-29 MED ORDER — PROPOFOL 10 MG/ML IV BOLUS
INTRAVENOUS | Status: DC | PRN
  Administered 2023-08-29: 150 mg via INTRAVENOUS

## 2023-08-29 MED ORDER — FENTANYL CITRATE (PF) 100 MCG/2ML IJ SOLN
INTRAMUSCULAR | Status: DC | PRN
  Administered 2023-08-29 (×2): 50 ug via INTRAVENOUS

## 2023-08-29 MED ORDER — METOCLOPRAMIDE HCL 5 MG PO TABS: 5.0000 mg | ORAL_TABLET | Freq: Three times a day (TID) | ORAL | Status: DC | PRN

## 2023-08-29 SURGICAL SUPPLY — 29 items
BAG COUNTER SPONGE SURGICOUNT (BAG) IMPLANT
BNDG ELASTIC 6INX 5YD STR LF (GAUZE/BANDAGES/DRESSINGS) IMPLANT
CHLORAPREP W/TINT 26 (MISCELLANEOUS) ×1 IMPLANT
DISSECTOR 3.8MM X 13CM (MISCELLANEOUS) ×1 IMPLANT
DRAPE SHEET LG 3/4 BI-LAMINATE (DRAPES) ×1 IMPLANT
DRAPE TOP 10253 STERILE (DRAPES) ×1 IMPLANT
DRAPE U-SHAPE 47X51 STRL (DRAPES) ×1 IMPLANT
DRSG EMULSION OIL 3X16 NADH (GAUZE/BANDAGES/DRESSINGS) ×1 IMPLANT
EXCALIBUR 3.8MM X 13CM (MISCELLANEOUS) ×1 IMPLANT
GAUZE 4X4 16PLY ~~LOC~~+RFID DBL (SPONGE) ×1 IMPLANT
GAUZE PAD ABD 8X10 STRL (GAUZE/BANDAGES/DRESSINGS) IMPLANT
GAUZE SPONGE 4X4 12PLY STRL (GAUZE/BANDAGES/DRESSINGS) IMPLANT
GAUZE XEROFORM 1X8 LF (GAUZE/BANDAGES/DRESSINGS) IMPLANT
GLOVE BIO SURGEON STRL SZ7.5 (GLOVE) ×1 IMPLANT
GLOVE BIOGEL PI IND STRL 7.5 (GLOVE) ×1 IMPLANT
GLOVE BIOGEL PI IND STRL 8 (GLOVE) ×1 IMPLANT
GOWN STRL REUS W/ TWL LRG LVL3 (GOWN DISPOSABLE) ×2 IMPLANT
KIT BASIN OR (CUSTOM PROCEDURE TRAY) ×1 IMPLANT
KIT TURNOVER KIT A (KITS) ×2 IMPLANT
MANIFOLD NEPTUNE II (INSTRUMENTS) IMPLANT
NS IRRIG 1000ML POUR BTL (IV SOLUTION) ×1 IMPLANT
PACK ARTHROSCOPY WL (CUSTOM PROCEDURE TRAY) ×1 IMPLANT
PADDING CAST COTTON 6X4 STRL (CAST SUPPLIES) IMPLANT
SUT ETHILON 2 0 PS N (SUTURE) IMPLANT
SUT NYLON 3 0 (SUTURE) ×1 IMPLANT
SYR 10ML LL (SYRINGE) ×1 IMPLANT
TOWEL OR 17X26 10 PK STRL BLUE (TOWEL DISPOSABLE) ×1 IMPLANT
TUBING ARTHROSCOPY IRRIG 16FT (MISCELLANEOUS) ×1 IMPLANT
WATER STERILE IRR 1000ML POUR (IV SOLUTION) ×1 IMPLANT

## 2023-08-29 NOTE — Progress Notes (Incomplete)
 Subjective: Patient reports pain as severe in knee currently. Knee joint was full of blood yesterday during surgery. Unsure if this is due to the high dose Lovenox  he is on. Very uncomfortable and wincing. Patient asking if he can be switched to Eliquis  now instead of the Lovenox .  Arms much better now and hands appear back to normal.  Eating some Chick fil a.  Urinating.   No CP, SOB.  Has mobilized OOB with PT but limited by pain.  Reliving heavily on narcotics around the clock. Have adjusted the orders some to try and wean him off a bit. Can't go home on IV Dilaudid .   Objective:   VITALS:   Vitals:   08/30/23 0558 08/30/23 0858 08/30/23 0910 08/30/23 1405  BP: 122/67  (!) 143/75 (!) 141/79  Pulse: 71  72 79  Resp: 15  17 17   Temp: 97.8 F (36.6 C)  98.1 F (36.7 C) 97.7 F (36.5 C)  TempSrc: Oral  Oral   SpO2: 94% 97% 99% 100%  Weight:      Height:          Latest Ref Rng & Units 08/29/2023    2:24 AM 08/26/2023    3:36 AM 08/25/2023   10:56 AM  CBC  WBC 4.0 - 10.5 K/uL 8.3  9.3  11.0   Hemoglobin 13.0 - 17.0 g/dL 82.9  56.2  13.0   Hematocrit 39.0 - 52.0 % 34.7  38.2  39.8   Platelets 150 - 400 K/uL 488  447  448       Latest Ref Rng & Units 08/26/2023    3:36 AM 08/25/2023   10:56 AM 08/23/2023    5:00 AM  BMP  Glucose 70 - 99 mg/dL  865  784   BUN 8 - 23 mg/dL  21  22   Creatinine 6.96 - 1.24 mg/dL 2.95  2.84  1.32   Sodium 135 - 145 mmol/L  132  130   Potassium 3.5 - 5.1 mmol/L  4.1  4.1   Chloride 98 - 111 mmol/L  99  96   CO2 22 - 32 mmol/L  24  25   Calcium  8.9 - 10.3 mg/dL  8.6  8.6    Intake/Output      06/19 0701 06/20 0700 06/20 0701 06/21 0700   P.O. 0 120   I.V. (mL/kg) 515 (6.4)    IV Piggyback 200 200   Total Intake(mL/kg) 715 (8.9) 320 (4)   Urine (mL/kg/hr) 500 (0.3) 700 (0.8)   Emesis/NG output 0    Stool 0    Blood 10    Total Output 510 700   Net +205 -380        Urine Occurrence 1 x    Stool Occurrence 0 x    Emesis  Occurrence 0 x       Physical Exam: General: NAD.  Laying in bed, looks very tired and uncomfortable, in obvious pain  Resp: No increased wob Cardio: regular rate and rhythm ABD soft Neurologically intact MSK Neurovascularly intact Sensation intact distally Intact pulses distally Dorsiflexion/Plantar flexion painful Incision: dressing C/D/I Harder to do SLR Erythema and ecchymosis to skin around incision Nylons in place Effusion present but no fluid wave Ace wrap VERY tight on his leg so unwrapped and rewrapped looser. Reports feeling relief  Assessment: 1 Day Post-Op  S/P Procedure(s) (LRB): ARTHROSCOPY, KNEE WITH DEBRIDEMENT (Left) by Dr. Deloras Fess. Murphy on 08/29/23  Principal Problem:  Septic joint of left knee joint (HCC) Active Problems:   Dyslipidemia   Anxiety disorder   Depression   Hypertension   GERD (gastroesophageal reflux disease)   Coronary atherosclerosis   MSSA bacteremia   Paroxysmal atrial fibrillation (HCC)   DVT (deep venous thrombosis) (HCC)   Protein-calorie malnutrition, severe   Plan: Specimen showing Staph aureus   Try to encourage less use of narcotics and more use of non-narcotic medicines like Robaxin , Tylenol , or Xanax. Will also order Prednisone  to try and help with swelling.  May need hematology consult to r/o blood disorders. + family history  Advance diet Up with therapy Incentive Spirometry Elevate and Apply ice  Weightbearing: WBAT LLE in knee brace Insicional and dressing care: Dressings left intact until follow-up and Reinforce dressings as needed Orthopedic device(s): knee brace Showering: Keep dressing dry VTE prophylaxis: Lovenox  80mg  bid while inpatient, SCDs, ambulation Pain control: PRN  Contact information:  Randal Bury MD, [address] PA-C   [place], [place] Office [phone] 08/30/2023, 6:10 PM

## 2023-08-29 NOTE — Anesthesia Preprocedure Evaluation (Addendum)
 Anesthesia Evaluation  Patient identified by MRN, date of birth, ID band Patient awake    Reviewed: Allergy & Precautions, NPO status , Patient's Chart, lab work & pertinent test results, reviewed documented beta blocker date and time   Airway Mallampati: II  TM Distance: >3 FB Neck ROM: Full    Dental  (+) Teeth Intact, Dental Advisory Given, Caps   Pulmonary asthma , COPD, former smoker   Pulmonary exam normal breath sounds clear to auscultation       Cardiovascular hypertension, Pt. on medications and Pt. on home beta blockers + CAD  Normal cardiovascular exam Rhythm:Regular Rate:Normal     Neuro/Psych  Headaches PSYCHIATRIC DISORDERS Anxiety Depression     Neuromuscular disease    GI/Hepatic ,GERD  Medicated,,(+) Hepatitis -, C  Endo/Other  negative endocrine ROS    Renal/GU Renal disease     Musculoskeletal  (+) Arthritis ,  Septic Left Knee   Abdominal   Peds  (+) ATTENTION DEFICIT DISORDER WITHOUT HYPERACTIVITY Hematology  (+) Blood dyscrasia, anemia   Anesthesia Other Findings Day of surgery medications reviewed with the patient.  Reproductive/Obstetrics                             Anesthesia Physical Anesthesia Plan  ASA: 3  Anesthesia Plan: General   Post-op Pain Management: Tylenol  PO (pre-op)*   Induction: Intravenous  PONV Risk Score and Plan: 2 and Dexamethasone , Ondansetron  and Treatment may vary due to age or medical condition  Airway Management Planned: LMA  Additional Equipment:   Intra-op Plan:   Post-operative Plan: Extubation in OR  Informed Consent: I have reviewed the patients History and Physical, chart, labs and discussed the procedure including the risks, benefits and alternatives for the proposed anesthesia with the patient or authorized representative who has indicated his/her understanding and acceptance.     Dental advisory given  Plan  Discussed with: CRNA  Anesthesia Plan Comments:         Anesthesia Quick Evaluation

## 2023-08-29 NOTE — Anesthesia Postprocedure Evaluation (Signed)
 Anesthesia Post Note  Patient: Paul [name].  Procedure(s) Performed: ARTHROSCOPY, KNEE WITH DEBRIDEMENT (Left: Knee)     Patient location during evaluation: PACU Anesthesia Type: General Level of consciousness: awake and alert, oriented and patient cooperative Pain management: pain level controlled Vital Signs Assessment: post-procedure vital signs reviewed and stable Respiratory status: spontaneous breathing, nonlabored ventilation and respiratory function stable Cardiovascular status: blood pressure returned to baseline and stable Postop Assessment: no apparent nausea or vomiting Anesthetic complications: no   No notable events documented.  Last Vitals:  Vitals:   08/29/23 1700 08/29/23 1715  BP: 133/72 127/89  Pulse: 73 75  Resp: 12 14  Temp:    SpO2: 98% 97%    Last Pain:  Vitals:   08/29/23 1715  TempSrc:   PainSc: 5                  Jacquelyne Matte

## 2023-08-29 NOTE — Anesthesia Procedure Notes (Signed)
 Procedure Name: LMA Insertion Date/Time: 08/29/2023 3:18 PM  Performed by: Delona Ferron, CRNAPre-anesthesia Checklist: Patient identified, Emergency Drugs available, Suction available, Patient being monitored and Timeout performed Patient Re-evaluated:Patient Re-evaluated prior to induction Oxygen Delivery Method: Circle system utilized Preoxygenation: Pre-oxygenation with 100% oxygen Induction Type: IV induction LMA: LMA inserted LMA Size: 4.0 Number of attempts: 1 Placement Confirmation: breath sounds checked- equal and bilateral and positive ETCO2 Tube secured with: Tape Dental Injury: Teeth and Oropharynx as per pre-operative assessment

## 2023-08-29 NOTE — Transfer of Care (Signed)
 Immediate Anesthesia Transfer of Care Note  Patient: Paul [name].  Procedure(s) Performed: ARTHROSCOPY, KNEE WITH DEBRIDEMENT (Left: Knee)  Patient Location: PACU  Anesthesia Type:General  Level of Consciousness: awake, sedated, and patient cooperative  Airway & Oxygen Therapy: Patient Spontanous Breathing and Patient connected to face mask oxygen  Post-op Assessment: Report given to RN and Post -op Vital signs reviewed and stable  Post vital signs: Reviewed and stable  Last Vitals:  Vitals Value Taken Time  BP 150/68 08/29/23 16:12  Temp    Pulse 76 08/29/23 16:14  Resp 13 08/29/23 16:14  SpO2 100 % 08/29/23 16:14  Vitals shown include unfiled device data.  Last Pain:  Vitals:   08/29/23 1319  TempSrc:   PainSc: 6       Patients Stated Pain Goal: 4 (08/29/23 0655)  Complications: No notable events documented.
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
## 2023-08-29 NOTE — Interval H&P Note (Signed)
 History and Physical Interval Note:  08/29/2023 7:05 AM  Paul [name].  has presented today for surgery, with the diagnosis of Septic Left Knee.  The various methods of treatment have been discussed with the patient and family. After consideration of risks, benefits and other options for treatment, the patient has consented to  Procedure(s): ARTHROSCOPY, KNEE WITH DEBRIDEMENT (Left) as a surgical intervention.  The patient's history has been reviewed, patient examined, no change in status, stable for surgery.  I have reviewed the patient's chart and labs.  Questions were answered to the patient's satisfaction.     Saundra Curl

## 2023-08-29 NOTE — Plan of Care (Signed)
 Patients understanding of care plan teaching appears to be progressing, patient verbalizes understanding.

## 2023-08-30 ENCOUNTER — Encounter (HOSPITAL_COMMUNITY): Payer: Self-pay | Admitting: Orthopedic Surgery

## 2023-08-30 DIAGNOSIS — F419 Anxiety disorder, unspecified: Secondary | ICD-10-CM | POA: Diagnosis not present

## 2023-08-30 DIAGNOSIS — F32A Depression, unspecified: Secondary | ICD-10-CM | POA: Diagnosis not present

## 2023-08-30 DIAGNOSIS — B9561 Methicillin susceptible Staphylococcus aureus infection as the cause of diseases classified elsewhere: Secondary | ICD-10-CM | POA: Diagnosis not present

## 2023-08-30 DIAGNOSIS — M00062 Staphylococcal arthritis, left knee: Secondary | ICD-10-CM | POA: Diagnosis not present

## 2023-08-30 DIAGNOSIS — I82A11 Acute embolism and thrombosis of right axillary vein: Secondary | ICD-10-CM | POA: Diagnosis not present

## 2023-08-30 MED ORDER — PREDNISONE 10 MG (21) PO TBPK
10.0000 mg | ORAL_TABLET | ORAL | Status: AC
  Administered 2023-08-30: 10 mg via ORAL

## 2023-08-30 MED ORDER — HYDROMORPHONE HCL 1 MG/ML IJ SOLN
0.5000 mg | Freq: Four times a day (QID) | INTRAMUSCULAR | Status: DC | PRN
  Administered 2023-08-31 (×2): 0.5 mg via INTRAVENOUS
  Filled 2023-08-30 (×2): qty 0.5

## 2023-08-30 MED ORDER — PREDNISONE 10 MG (21) PO TBPK
20.0000 mg | ORAL_TABLET | Freq: Every morning | ORAL | Status: AC
  Administered 2023-08-30: 20 mg via ORAL
  Filled 2023-08-30: qty 21

## 2023-08-30 MED ORDER — HYDROMORPHONE HCL 2 MG PO TABS
1.0000 mg | ORAL_TABLET | ORAL | Status: DC | PRN
  Administered 2023-08-31 – 2023-09-02 (×2): 2 mg via ORAL
  Filled 2023-08-30 (×2): qty 1

## 2023-08-30 MED ORDER — PREDNISONE 10 MG (21) PO TBPK
20.0000 mg | ORAL_TABLET | Freq: Every evening | ORAL | Status: AC
  Administered 2023-08-31: 20 mg via ORAL

## 2023-08-30 MED ORDER — PREDNISONE 10 MG (21) PO TBPK
20.0000 mg | ORAL_TABLET | Freq: Every evening | ORAL | Status: AC
  Administered 2023-08-30: 20 mg via ORAL

## 2023-08-30 MED ORDER — ORAL CARE MOUTH RINSE: 15.0000 mL | OROMUCOSAL | Status: DC | PRN

## 2023-08-30 MED ORDER — PREDNISONE 10 MG (21) PO TBPK
10.0000 mg | ORAL_TABLET | Freq: Three times a day (TID) | ORAL | Status: AC
  Administered 2023-08-31 (×3): 10 mg via ORAL
  Filled 2023-08-30: qty 21

## 2023-08-30 MED ORDER — PREDNISONE 10 MG (21) PO TBPK
10.0000 mg | ORAL_TABLET | Freq: Four times a day (QID) | ORAL | Status: AC
  Administered 2023-09-01 – 2023-09-04 (×10): 10 mg via ORAL

## 2023-08-30 NOTE — Plan of Care (Signed)
  Problem: Clinical Measurements: Goal: Ability to maintain clinical measurements within normal limits will improve Outcome: Progressing Goal: Will remain free from infection Outcome: Progressing Goal: Diagnostic test results will improve Outcome: Progressing Goal: Respiratory complications will improve Outcome: Progressing Goal: Cardiovascular complication will be avoided Outcome: Progressing   Problem: Activity: Goal: Risk for activity intolerance will decrease Outcome: Progressing   Problem: Pain Managment: Goal: General experience of comfort will improve and/or be controlled Outcome: Progressing   Problem: Safety: Goal: Ability to remain free from injury will improve Outcome: Progressing
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
## 2023-08-30 NOTE — Plan of Care (Signed)
  Problem: Health Behavior/Discharge Planning: Goal: Ability to manage health-related needs will improve Outcome: Progressing   Problem: Clinical Measurements: Goal: Ability to maintain clinical measurements within normal limits will improve Outcome: Progressing Goal: Will remain free from infection Outcome: Progressing Goal: Diagnostic test results will improve Outcome: Progressing Goal: Respiratory complications will improve Outcome: Progressing   Problem: Activity: Goal: Risk for activity intolerance will decrease Outcome: Progressing   Problem: Elimination: Goal: Will not experience complications related to bowel motility Outcome: Progressing   Problem: Pain Managment: Goal: General experience of comfort will improve and/or be controlled Outcome: Progressing   Problem: Safety: Goal: Ability to remain free from injury will improve Outcome: Progressing

## 2023-08-31 DIAGNOSIS — M00062 Staphylococcal arthritis, left knee: Secondary | ICD-10-CM | POA: Diagnosis not present

## 2023-08-31 DIAGNOSIS — I82A11 Acute embolism and thrombosis of right axillary vein: Secondary | ICD-10-CM | POA: Diagnosis not present

## 2023-08-31 DIAGNOSIS — F32A Depression, unspecified: Secondary | ICD-10-CM | POA: Diagnosis not present

## 2023-08-31 DIAGNOSIS — F419 Anxiety disorder, unspecified: Secondary | ICD-10-CM | POA: Diagnosis not present

## 2023-08-31 LAB — SEDIMENTATION RATE: Sed Rate: 111 mm/h — ABNORMAL HIGH (ref 0–16)

## 2023-08-31 LAB — C-REACTIVE PROTEIN: CRP: 6.5 mg/dL — ABNORMAL HIGH (ref <1.0)

## 2023-08-31 MED ORDER — APIXABAN 5 MG PO TABS
5.0000 mg | ORAL_TABLET | Freq: Two times a day (BID) | ORAL | Status: DC
  Administered 2023-08-31 – 2023-09-03 (×8): 5 mg via ORAL
  Filled 2023-08-31 (×8): qty 1

## 2023-08-31 NOTE — Progress Notes (Signed)
 PT Cancellation Note  Patient Details Name: Paul [name]. MRN: [id number] DOB: 1945-05-12   Cancelled Treatment:     not a good day today, stated Pt.  Having increased bouts of pain.  Can't seem to get it under control.  Pt requested we try again tomorrow.  RN in room.   Katheryn Leap  PTA Acute  Rehabilitation Services Office M-F          [phone]

## 2023-08-31 NOTE — Progress Notes (Signed)
 [facility] (OTS)  2 Days Post-Op Procedure(s) (LRB): ARTHROSCOPY, KNEE WITH DEBRIDEMENT (Left)  Subjective: Patient reports pain as severe earlier this am but thankfully peaked and is now significantly improved.    Objective: Current Vitals Blood pressure (!) 143/79, pulse 75, temperature 98.5 F (36.9 C), resp. rate 18, height 6' (1.829 m), weight 80.7 kg, SpO2 93%. Vital signs in last 24 hours: Temp:  [98.1 F (36.7 C)-98.5 F (36.9 C)] 98.5 F (36.9 C) (06/21 1252) Pulse Rate:  [75-102] 75 (06/21 1252) Resp:  [17-18] 18 (06/21 1252) BP: (129-143)/(63-93) 143/79 (06/21 1252) SpO2:  [93 %-100 %] 93 % (06/21 1252)  Intake/Output from previous day: 06/20 0701 - 06/21 0700 In: 1480 [P.O.:980; IV Piggyback:500] Out: 1300 [Urine:1300]  LABS Recent Labs    08/29/23 0224  HGB 11.1*   Recent Labs    08/29/23 0224  WBC 8.3  RBC 3.64*  HCT 34.7*  PLT 488*   Physical Exam RUE    PICC in place  No edema or redness LLE Dressing removed and reapplied, largely intact, clean, dry except for a few spots  Tight swelling at the knee consistent with hematoma  Edema/ swelling controlled distally  Knee flexed 20 degrees  Sens: DPN, SPN, TN intact  Motor: EHL, FHL, and lessor toe ext and flex all intact grossly  Brisk cap refill, warm to touch  Assessment/Plan: 2 Days Post-Op Procedure(s) (LRB): ARTHROSCOPY, KNEE WITH DEBRIDEMENT (Left) 1. PT refused today; resume tomorrow  2. DVT proph Eliquis  3. Prednisone -- he thinks helps a lot  4. Appreciative of Dilaudid  (if needed) bc in tears this am; has not needed it this pm at all  Ozell Bruch, MD [facility], [facility] [phone]

## 2023-08-31 NOTE — Progress Notes (Addendum)
 [facility]  Date of Admission:  08/19/2023   Total days of inpatient antibiotics 6  Principal Problem:   Septic joint of left knee joint (HCC) Active Problems:   Dyslipidemia   Anxiety disorder   Depression   Hypertension   GERD (gastroesophageal reflux disease)   Coronary atherosclerosis   MSSA bacteremia   Paroxysmal atrial fibrillation (HCC)   DVT (deep venous thrombosis) (HCC)   Protein-calorie malnutrition, severe          Assessment: 78 year old male with history of MSSA bacteremia, left knee septic arthritis with MSSA on cefazolin  x 4 weeks readmitted for left knee I&D. #Left knee septic arthritis status post I&D on 6/9 WITH cX+ mssa - Patient had initially admitted for MSSA bacteremia found to have left septic knee blood cultures cleared on 6/4.  He was taken to the OR on 6/4 for I&D of deep abscess of knee.  ID was engaged and patient discharged on cefazolin  x 4 weeks as TEE did not show vegetation. - Patient reports that he went home on Saturday and then knee pain worsened.  He was seen by orthopedics on Monday and taken to the OR on 6/9 for debridement.  Per OR note fluid collection was in the knee joint as well. -Knee was aspirated on 6/17, there was concern for hematoma.  There was 70 mL of bloody fluid aspirated. Recommendations:  - Continue cefazolin . - Reset the clock on 4 weeks antibiotics from OR on 6/19 -OR on 6/19 given large hematoma.  Will follow-up on OR findings/notes.  Repeat OR cultures pending.   #DVT #R PICC -Found to have R axillary DVT.  Lower extremity ultrasound, no DVT -IV team reviewing PICC line -Switched to DOAC Plan: - Superficial thrombus in left upper extremity.  Per IV team PICC line should remain in place as it is functional, clinically necessary., but anticoagulant x 3 months recommended.  If not needed the line should be removed moved 3 to 5 days.  Do not recommend placing another PICC in opposite  extremity as the patient would be at risk for DVT formation the new PICC line.  Evaluation of this patient requires complex antimicrobial therapy evaluation and counseling + isolation needs for disease transmission risk assessment and mitigation   Microbiology:   Antibiotics: Cefazolin  6/2-present   Cultures: Blood 6/22/2 MSSA 6/4 no growth  6/9 OR Cx stpah auresu SUBJECTIVE: Resting in bed.  No new complaints today. Interval: afebrile overnight  Review of Systems: Review of Systems  All other systems reviewed and are negative.    Scheduled Meds:  atorvastatin   10 mg Oral Daily   buPROPion   150 mg Oral Daily   Chlorhexidine  Gluconate Cloth  6 each Topical Daily   cholecalciferol   1,000 Units Oral Daily   enoxaparin  (LOVENOX ) injection  80 mg Subcutaneous Q12H   finasteride   5 mg Oral Daily   fluticasone  furoate-vilanterol  1 puff Inhalation Daily   irbesartan   150 mg Oral Daily   metoprolol  tartrate  25 mg Oral BID   multivitamin with minerals  1 tablet Oral Daily   mupirocin  ointment  1 Application Nasal BID   nutrition supplement (JUVEN)  1 packet Oral BID BM   oxyCODONE   10 mg Oral Q12H   pantoprazole   40 mg Oral Daily   predniSONE   10 mg Oral 3 x daily with food   [START ON 09/01/2023] predniSONE   10 mg Oral 4X daily taper  predniSONE   20 mg Oral Nightly   Ensure Max Protein  11 oz Oral Daily   sodium chloride  flush  10-40 mL Intracatheter Q12H   triamterene -hydrochlorothiazide   1 tablet Oral Daily   Continuous Infusions:   ceFAZolin  (ANCEF ) IV 2 g (08/31/23 0512)   PRN Meds:.acetaminophen , bisacodyl , diazepam , diphenhydrAMINE , HYDROmorphone  (DILAUDID ) injection, HYDROmorphone , magnesium  citrate, methocarbamol  **OR** methocarbamol  (ROBAXIN ) injection, mouth rinse, oxyCODONE , polyethylene glycol, sodium chloride  flush, zolpidem  Allergies  Allergen Reactions   Other Itching    PATCHES. Patient states that any patch on his skin causes him to itch      OBJECTIVE: Vitals:   08/30/23 1405 08/30/23 1811 08/30/23 2208 08/31/23 0505  BP: (!) 141/79 (!) 129/93 130/77 132/63  Pulse: 79 97 (!) 102 79  Resp: 17 17 18 18   Temp: 97.7 F (36.5 C) 98.3 F (36.8 C) 98.5 F (36.9 C) 98.1 F (36.7 C)  TempSrc:      SpO2: 100% 99% 98% 100%  Weight:      Height:       Body mass index is 24.13 kg/m.  Physical Exam Constitutional:      General: He is not in acute distress.    Appearance: He is normal weight. He is not toxic-appearing.  HENT:     Head: Normocephalic and atraumatic.     Right Ear: External ear normal.     Left Ear: External ear normal.     Nose: No congestion or rhinorrhea.     Mouth/Throat:     Mouth: Mucous membranes are moist.     Pharynx: Oropharynx is clear.   Eyes:     Extraocular Movements: Extraocular movements intact.     Conjunctiva/sclera: Conjunctivae normal.     Pupils: Pupils are equal, round, and reactive to light.    Cardiovascular:     Rate and Rhythm: Normal rate and regular rhythm.     Heart sounds: No murmur heard.    No friction rub. No gallop.  Pulmonary:     Effort: Pulmonary effort is normal.     Breath sounds: Normal breath sounds.  Abdominal:     General: Abdomen is flat. Bowel sounds are normal.     Palpations: Abdomen is soft.   Musculoskeletal:        General: No swelling.     Cervical back: Normal range of motion and neck supple.     Comments: Left knee wound B/l hand swelling improved   Skin:    General: Skin is warm and dry.   Neurological:     General: No focal deficit present.     Mental Status: He is oriented to person, place, and time.   Psychiatric:        Mood and Affect: Mood normal.       Lab Results Lab Results  Component Value Date   WBC 8.3 08/29/2023   HGB 11.1 (L) 08/29/2023   HCT 34.7 (L) 08/29/2023   MCV 95.3 08/29/2023   PLT 488 (H) 08/29/2023    Lab Results  Component Value Date   CREATININE 0.86 08/26/2023   BUN 21 08/25/2023   NA  132 (L) 08/25/2023   K 4.1 08/25/2023   CL 99 08/25/2023   CO2 24 08/25/2023    Lab Results  Component Value Date   ALT 14 08/23/2023   AST 30 08/23/2023   ALKPHOS 53 08/23/2023   BILITOT 0.4 08/23/2023        Paul Stank, MD [facility] 08/31/2023, 6:04 AM
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
## 2023-09-01 DIAGNOSIS — M00062 Staphylococcal arthritis, left knee: Secondary | ICD-10-CM | POA: Diagnosis not present

## 2023-09-01 DIAGNOSIS — F32A Depression, unspecified: Secondary | ICD-10-CM | POA: Diagnosis not present

## 2023-09-01 DIAGNOSIS — F419 Anxiety disorder, unspecified: Secondary | ICD-10-CM | POA: Diagnosis not present

## 2023-09-01 DIAGNOSIS — I82A11 Acute embolism and thrombosis of right axillary vein: Secondary | ICD-10-CM | POA: Diagnosis not present

## 2023-09-01 LAB — CBC
HCT: 30.6 % — ABNORMAL LOW (ref 39.0–52.0)
Hemoglobin: 9.8 g/dL — ABNORMAL LOW (ref 13.0–17.0)
MCH: 30.5 pg (ref 26.0–34.0)
MCHC: 32 g/dL (ref 30.0–36.0)
MCV: 95.3 fL (ref 80.0–100.0)
Platelets: 491 10*3/uL — ABNORMAL HIGH (ref 150–400)
RBC: 3.21 MIL/uL — ABNORMAL LOW (ref 4.22–5.81)
RDW: 12.1 % (ref 11.5–15.5)
WBC: 13.8 10*3/uL — ABNORMAL HIGH (ref 4.0–10.5)
nRBC: 0 % (ref 0.0–0.2)

## 2023-09-01 NOTE — Plan of Care (Signed)
  Problem: Health Behavior/Discharge Planning: Goal: Ability to manage health-related needs will improve Outcome: Progressing   Problem: Clinical Measurements: Goal: Ability to maintain clinical measurements within normal limits will improve Outcome: Progressing Goal: Will remain free from infection Outcome: Progressing Goal: Diagnostic test results will improve Outcome: Progressing Goal: Respiratory complications will improve Outcome: Progressing   Problem: Activity: Goal: Risk for activity intolerance will decrease Outcome: Progressing   Problem: Elimination: Goal: Will not experience complications related to bowel motility Outcome: Progressing   Problem: Pain Managment: Goal: General experience of comfort will improve and/or be controlled Outcome: Progressing   Problem: Safety: Goal: Ability to remain free from injury will improve Outcome: Progressing

## 2023-09-01 NOTE — Progress Notes (Signed)
 [facility] (OTS)  3 Days Post-Op Procedure(s) (LRB): ARTHROSCOPY, KNEE WITH DEBRIDEMENT (Left)  Subjective: Patient reports pain as mild.    Objective: Current Vitals Blood pressure 106/66, pulse 79, temperature 97.9 F (36.6 C), temperature source Oral, resp. rate 16, height 6' (1.829 m), weight 80.7 kg, SpO2 100%. Vital signs in last 24 hours: Temp:  [97.6 F (36.4 C)-97.9 F (36.6 C)] 97.9 F (36.6 C) (06/22 1538) Pulse Rate:  [69-79] 79 (06/22 1538) Resp:  [16] 16 (06/22 1538) BP: (106-126)/(57-74) 106/66 (06/22 1538) SpO2:  [98 %-100 %] 100 % (06/22 1538)  Intake/Output from previous day: 06/21 0701 - 06/22 0700 In: -  Out: 1600 [Urine:1600]  LABS Recent Labs    09/01/23 0310  HGB 9.8*   Recent Labs    09/01/23 0310  WBC 13.8*  RBC 3.21*  HCT 30.6*  PLT 491*   No results for input(s): NA, K, CL, CO2, BUN, CREATININE, GLUCOSE, CALCIUM  in the last 72 hours. No results for input(s): LABPT, INR in the last 72 hours.   Physical Exam LLE No real change Dressing intact, clean, dry  Edema/ swelling controlled;  Sens: DPN, SPN, TN intact  Motor: EHL, FHL, and lessor toe ext and flex all intact grossly  Brisk cap refill, warm to touch  Assessment/Plan: 3 Days Post-Op Procedure(s) (LRB): ARTHROSCOPY, KNEE WITH DEBRIDEMENT (Left) 1. PT/OT dramatic improvement today 2. DVT proph Eliquis  3. Prednisone  4. Hopeful for d/c tomorrow  Paul Bruch, MD [facility], [facility] [phone]
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
## 2023-09-02 ENCOUNTER — Telehealth (HOSPITAL_COMMUNITY): Payer: Self-pay | Admitting: Pharmacy Technician

## 2023-09-02 ENCOUNTER — Other Ambulatory Visit (HOSPITAL_COMMUNITY): Payer: Self-pay

## 2023-09-02 ENCOUNTER — Encounter: Payer: Self-pay | Admitting: Internal Medicine

## 2023-09-02 DIAGNOSIS — F419 Anxiety disorder, unspecified: Secondary | ICD-10-CM | POA: Diagnosis not present

## 2023-09-02 DIAGNOSIS — B9561 Methicillin susceptible Staphylococcus aureus infection as the cause of diseases classified elsewhere: Secondary | ICD-10-CM | POA: Diagnosis not present

## 2023-09-02 DIAGNOSIS — F32A Depression, unspecified: Secondary | ICD-10-CM | POA: Diagnosis not present

## 2023-09-02 DIAGNOSIS — I82A11 Acute embolism and thrombosis of right axillary vein: Secondary | ICD-10-CM | POA: Diagnosis not present

## 2023-09-02 DIAGNOSIS — M00062 Staphylococcal arthritis, left knee: Secondary | ICD-10-CM | POA: Diagnosis not present

## 2023-09-02 LAB — CBC WITH DIFFERENTIAL/PLATELET
Abs Immature Granulocytes: 0.81 10*3/uL — ABNORMAL HIGH (ref 0.00–0.07)
Basophils Absolute: 0.1 10*3/uL (ref 0.0–0.1)
Basophils Relative: 1 %
Eosinophils Absolute: 0 10*3/uL (ref 0.0–0.5)
Eosinophils Relative: 0 %
HCT: 32.1 % — ABNORMAL LOW (ref 39.0–52.0)
Hemoglobin: 10.2 g/dL — ABNORMAL LOW (ref 13.0–17.0)
Immature Granulocytes: 5 %
Lymphocytes Relative: 9 %
Lymphs Abs: 1.3 10*3/uL (ref 0.7–4.0)
MCH: 30 pg (ref 26.0–34.0)
MCHC: 31.8 g/dL (ref 30.0–36.0)
MCV: 94.4 fL (ref 80.0–100.0)
Monocytes Absolute: 1.2 10*3/uL — ABNORMAL HIGH (ref 0.1–1.0)
Monocytes Relative: 8 %
Neutro Abs: 11.8 10*3/uL — ABNORMAL HIGH (ref 1.7–7.7)
Neutrophils Relative %: 77 %
Platelets: 694 10*3/uL — ABNORMAL HIGH (ref 150–400)
RBC: 3.4 MIL/uL — ABNORMAL LOW (ref 4.22–5.81)
RDW: 12.5 % (ref 11.5–15.5)
WBC: 15.1 10*3/uL — ABNORMAL HIGH (ref 4.0–10.5)
nRBC: 0 % (ref 0.0–0.2)

## 2023-09-02 MED ORDER — HYDROMORPHONE HCL 2 MG PO TABS
1.0000 mg | ORAL_TABLET | ORAL | Status: DC | PRN
  Administered 2023-09-03 – 2023-09-04 (×3): 1 mg via ORAL
  Filled 2023-09-02 (×3): qty 1

## 2023-09-02 MED ORDER — CEFAZOLIN IV (FOR PTA / DISCHARGE USE ONLY): 2.0000 g | Freq: Three times a day (TID) | INTRAVENOUS | 0 refills | Status: AC

## 2023-09-02 MED ORDER — SODIUM CHLORIDE 0.9 % IV BOLUS
1000.0000 mL | Freq: Once | INTRAVENOUS | Status: AC
  Administered 2023-09-02: 1000 mL via INTRAVENOUS

## 2023-09-02 MED ORDER — OXYCODONE HCL 5 MG PO TABS
5.0000 mg | ORAL_TABLET | Freq: Four times a day (QID) | ORAL | Status: DC | PRN
  Administered 2023-09-02 – 2023-09-03 (×5): 10 mg via ORAL
  Administered 2023-09-03: 5 mg via ORAL
  Administered 2023-09-04 – 2023-09-05 (×3): 10 mg via ORAL
  Filled 2023-09-02: qty 2
  Filled 2023-09-02: qty 1
  Filled 2023-09-02 (×7): qty 2

## 2023-09-02 MED ORDER — PHENYLEPHRINE HCL-NACL 20-0.9 MG/250ML-% IV SOLN
INTRAVENOUS | Status: AC
  Filled 2023-09-02: qty 500

## 2023-09-02 NOTE — Progress Notes (Addendum)
 PHARMACY CONSULT NOTE FOR:  OUTPATIENT  PARENTERAL ANTIBIOTIC THERAPY (OPAT)  Indication: MSSA bacteremia/septic arthritis  Regimen: cefazolin  2g IV q8h End date: 09/26/23  IV antibiotic discharge orders are pended. To discharging provider:  please sign these orders via discharge navigator,  Select New Orders & click on the button choice - Manage This Unsigned Work.     Thank you for allowing pharmacy to be a part of this patient's care.  Deleta Colt 09/02/2023, 10:08 AM Student Pharmacist

## 2023-09-02 NOTE — Telephone Encounter (Signed)
 Patient Product/process development scientist completed.    The patient is insured through [facility]. Patient has [employer], may use a copay card, and/or apply for patient assistance if available.    Ran test claim for Eliquis 5 mg and the current 30 day co-pay is $0.00.   This test claim was processed through [facility]- copay amounts may vary at other pharmacies due to pharmacy/plan contracts, or as the patient moves through the different stages of their insurance plan.     Roland Earl, CPHT Pharmacy Technician III Certified Patient [facility] Patient Advocate Team Direct Number: [phone]  Fax: [phone]

## 2023-09-02 NOTE — Progress Notes (Signed)
 [facility]  Date of Admission:  08/19/2023   Total days of inpatient antibiotics 14  Principal Problem:   Septic joint of left knee joint (HCC) Active Problems:   Dyslipidemia   Anxiety disorder   Depression   Hypertension   GERD (gastroesophageal reflux disease)   Coronary atherosclerosis   MSSA bacteremia   Paroxysmal atrial fibrillation (HCC)   DVT (deep venous thrombosis) (HCC)   Protein-calorie malnutrition, severe          Assessment: 78 year old male with history of MSSA bacteremia, left knee septic arthritis with MSSA on cefazolin  x 4 weeks readmitted for left knee I&D. #Left knee septic arthritis status post I&D on 6/9 WITH cX+ mssa - Patient had initially admitted for MSSA bacteremia found to have left septic knee blood cultures cleared on 6/4.  He was taken to the OR on 6/4 for I&D of deep abscess of knee.  ID was engaged and patient discharged on cefazolin  x 4 weeks as TEE did not show vegetation. - Patient reports that he went home on Saturday and then knee pain worsened.  He was seen by orthopedics on Monday and taken to the OR on 6/9 for debridement.  Per OR note fluid collection was in the knee joint as well. -Knee was aspirated on 6/17, there was concern for hematoma.  There was 70 mL of bloody fluid aspirated. Recommendations:  - Continue cefazolin . - Reset the clock on 4 weeks antibiotics from OR on 6/19 for debredement, noted evacuated abscess. OR cx NG   #DVT #R PICC -Found to have R axillary DVT.  Lower extremity ultrasound, no DVT -IV team reviewing PICC line -Switched to DOAC Plan: - Superficial thrombus in left upper extremity.  Per IV team PICC line should remain in place as it is functional, clinically necessary., but anticoagulant x 3 months recommended.  If not needed the line should be removed moved 3 to 5 days.  Do not recommend placing another PICC in opposite extremity as the patient would be at risk for DVT  formation the new PICC line.  OPAT ORDERS:   Diagnosis: Left knee septic arthritis   Allergies       Allergies  Allergen Reactions   Other Itching      PATCHES. Patient states that any patch on his skin causes him to itch           Discharge antibiotics to be given via PICC line:   Per pharmacy protocol cefazolin  2gm q8h     Duration:  4 weeks End Date: 09/26/23   [facility] Care Per Protocol with Biopatch Use: Home health RN for IV administration and teaching, line care and labs.     Labs weekly while on IV antibiotics: x__ CBC with differential __ BMP **TWICE WEEKLY ON VANCOMYCIN   x__ CMP x__ CRP x__ ESR __ Vancomycin  trough TWICE WEEKLY __ CK   __ Please pull PIC at completion of IV antibiotics x__ Please leave PIC in place until doctor has seen patient or been notified   Fax weekly labs to [phone]   Clinic Follow Up Appt: 7/2    Evaluation of this patient requires complex antimicrobial therapy evaluation and counseling + isolation needs for disease transmission risk assessment and mitigation   Microbiology:   Antibiotics: Cefazolin  6/2-present   Cultures: Blood 6/22/2 MSSA 6/4 no growth  6/9 OR Cx stpah auresu SUBJECTIVE: Resting in bed.  No new complaints today. Interval: afebrile  overnight  Review of Systems: Review of Systems  All other systems reviewed and are negative.    Scheduled Meds:  apixaban   5 mg Oral BID   atorvastatin   10 mg Oral Daily   buPROPion   150 mg Oral Daily   Chlorhexidine  Gluconate Cloth  6 each Topical Daily   cholecalciferol   1,000 Units Oral Daily   finasteride   5 mg Oral Daily   fluticasone  furoate-vilanterol  1 puff Inhalation Daily   irbesartan   150 mg Oral Daily   metoprolol  tartrate  25 mg Oral BID   multivitamin with minerals  1 tablet Oral Daily   mupirocin  ointment  1 Application Nasal BID   nutrition supplement (JUVEN)  1 packet Oral BID BM   oxyCODONE   10 mg Oral Q12H   pantoprazole   40 mg Oral  Daily   phenylephrine        predniSONE   10 mg Oral 4X daily taper   Ensure Max Protein  11 oz Oral Daily   sodium chloride  flush  10-40 mL Intracatheter Q12H   triamterene -hydrochlorothiazide   1 tablet Oral Daily   Continuous Infusions:   ceFAZolin  (ANCEF ) IV 2 g (09/02/23 0603)   PRN Meds:.acetaminophen , bisacodyl , diazepam , diphenhydrAMINE , HYDROmorphone , magnesium  citrate, methocarbamol  **OR** methocarbamol  (ROBAXIN ) injection, mouth rinse, oxyCODONE , phenylephrine , polyethylene glycol, sodium chloride  flush, zolpidem  Allergies  Allergen Reactions   Other Itching    PATCHES. Patient states that any patch on his skin causes him to itch     OBJECTIVE: Vitals:   09/01/23 1538 09/01/23 2033 09/02/23 0612 09/02/23 0848  BP: 106/66 137/74 122/74   Pulse: 79 80 63   Resp: 16 17 16    Temp: 97.9 F (36.6 C) 98 F (36.7 C) 98.2 F (36.8 C)   TempSrc: Oral  Oral   SpO2: 100% 98% 98% 97%  Weight:      Height:       Body mass index is 24.13 kg/m.  Physical Exam Constitutional:      General: He is not in acute distress.    Appearance: He is normal weight. He is not toxic-appearing.  HENT:     Head: Normocephalic and atraumatic.     Right Ear: External ear normal.     Left Ear: External ear normal.     Nose: No congestion or rhinorrhea.     Mouth/Throat:     Mouth: Mucous membranes are moist.     Pharynx: Oropharynx is clear.   Eyes:     Extraocular Movements: Extraocular movements intact.     Conjunctiva/sclera: Conjunctivae normal.     Pupils: Pupils are equal, round, and reactive to light.    Cardiovascular:     Rate and Rhythm: Normal rate and regular rhythm.     Heart sounds: No murmur heard.    No friction rub. No gallop.  Pulmonary:     Effort: Pulmonary effort is normal.     Breath sounds: Normal breath sounds.  Abdominal:     General: Abdomen is flat. Bowel sounds are normal.     Palpations: Abdomen is soft.   Musculoskeletal:        General: No  swelling.     Cervical back: Normal range of motion and neck supple.     Comments: Left knee bandaged   Skin:    General: Skin is warm and dry.   Neurological:     General: No focal deficit present.     Mental Status: He is oriented to person, place, and time.  Psychiatric:        Mood and Affect: Mood normal.       Lab Results Lab Results  Component Value Date   WBC 13.8 (H) 09/01/2023   HGB 9.8 (L) 09/01/2023   HCT 30.6 (L) 09/01/2023   MCV 95.3 09/01/2023   PLT 491 (H) 09/01/2023    Lab Results  Component Value Date   CREATININE 0.86 08/26/2023   BUN 21 08/25/2023   NA 132 (L) 08/25/2023   K 4.1 08/25/2023   CL 99 08/25/2023   CO2 24 08/25/2023    Lab Results  Component Value Date   ALT 14 08/23/2023   AST 30 08/23/2023   ALKPHOS 53 08/23/2023   BILITOT 0.4 08/23/2023        Paul Stank, MD [facility] 09/02/2023, 12:40 PM

## 2023-09-02 NOTE — H&P (View-Only) (Signed)
 Subjective: Patient reports pain as moderate in knee currently. Better than it has been for days. Feeling hopeful about this.  Arms much better now and hands appear back to normal.  Eating more.  Urinating.  No CP, SOB.  Has mobilized OOB with PT but limited by pain.  Have adjusted narcotic orders some to try and wean him off a bit.  Elevation of WBCs late last night so will reorder today.   Objective:   VITALS:   Vitals:   09/01/23 1538 09/01/23 2033 09/02/23 0612 09/02/23 0848  BP: 106/66 137/74 122/74   Pulse: 79 80 63   Resp: 16 17 16    Temp: 97.9 F (36.6 C) 98 F (36.7 C) 98.2 F (36.8 C)   TempSrc: Oral  Oral   SpO2: 100% 98% 98% 97%  Weight:      Height:          Latest Ref Rng & Units 09/01/2023    3:10 AM 08/29/2023    2:24 AM 08/26/2023    3:36 AM  CBC  WBC 4.0 - 10.5 K/uL 13.8  8.3  9.3   Hemoglobin 13.0 - 17.0 g/dL 9.8  88.8  87.2   Hematocrit 39.0 - 52.0 % 30.6  34.7  38.2   Platelets 150 - 400 K/uL 491  488  447       Latest Ref Rng & Units 08/26/2023    3:36 AM 08/25/2023   10:56 AM 08/23/2023    5:00 AM  BMP  Glucose 70 - 99 mg/dL  864  887   BUN 8 - 23 mg/dL  21  22   Creatinine 9.38 - 1.24 mg/dL 9.13  9.21  9.21   Sodium 135 - 145 mmol/L  132  130   Potassium 3.5 - 5.1 mmol/L  4.1  4.1   Chloride 98 - 111 mmol/L  99  96   CO2 22 - 32 mmol/L  24  25   Calcium  8.9 - 10.3 mg/dL  8.6  8.6    Intake/Output      06/22 0701 06/23 0700 06/23 0701 06/24 0700   P.O. 960 580   IV Piggyback 400    Total Intake(mL/kg) 1360 (16.9) 580 (7.2)   Urine (mL/kg/hr) 350 (0.2) 525 (1.2)   Total Output 350 525   Net +1010 +55           Physical Exam: General: NAD.  Sitting up in bed, calm, comfortable  Resp: No increased wob Cardio: regular rate and rhythm ABD soft Neurologically intact MSK Neurovascularly intact Sensation intact distally Intact pulses distally Dorsiflexion/Plantar flexion intact Incision: dressing C/D/I Can easily do  SLR Erythema and ecchymosis to skin around incision continues to improve Nylons in place Effusion present but no fluid wave. Appears more subQ than intra-articular          Assessment: 4 Days Post-Op  S/P Procedure(s) (LRB): ARTHROSCOPY, KNEE WITH DEBRIDEMENT (Left) by Dr. Evalene BIRCH. Beverley on 08/29/23  Principal Problem:   Septic joint of left knee joint (HCC) Active Problems:   Dyslipidemia   Anxiety disorder   Depression   Hypertension   GERD (gastroesophageal reflux disease)   Coronary atherosclerosis   MSSA bacteremia   Paroxysmal atrial fibrillation (HCC)   DVT (deep venous thrombosis) (HCC)   Protein-calorie malnutrition, severe   Plan: IV ABX via PICC line per ID  Try to encourage less use of narcotics and more use of non-narcotic medicines like Robaxin , Tylenol , or Xanax.  Advance diet Up with therapy Incentive Spirometry Elevate and Apply ice  Weightbearing: WBAT LLE in knee brace Insicional and dressing care: Dressings left intact until follow-up and Reinforce dressings as needed Orthopedic device(s): knee brace Showering: Keep dressing dry VTE prophylaxis: switched to Eliquis  5mg  bid while inpatient, SCDs, ambulation Pain control: PRN  Dispo: TBD. Patient was hopeful to d/c home today but due to the elevated WBCs we want to redraw a new test and see if still elevated or not. If decreased then can d/c home today. If still elevated then will need to remain inpatient and have another arthroscopic wash out likely on Wednesday with Dr. Cristy.   Contact information:  Evalene Chancy MD, [address] [place], [place] Office [phone] 09/02/2023, 12:35 PM

## 2023-09-02 NOTE — Progress Notes (Signed)
 Subjective: Patient reports pain as moderate in knee currently. Better than it has been for days. Feeling hopeful about this.  Arms much better now and hands appear back to normal.  Eating more.  Urinating.  No CP, SOB.  Has mobilized OOB with PT but limited by pain.  Have adjusted narcotic orders some to try and wean him off a bit.  Elevation of WBCs late last night so will reorder today.   Objective:   VITALS:   Vitals:   09/01/23 1538 09/01/23 2033 09/02/23 0612 09/02/23 0848  BP: 106/66 137/74 122/74   Pulse: 79 80 63   Resp: 16 17 16    Temp: 97.9 F (36.6 C) 98 F (36.7 C) 98.2 F (36.8 C)   TempSrc: Oral  Oral   SpO2: 100% 98% 98% 97%  Weight:      Height:          Latest Ref Rng & Units 09/01/2023    3:10 AM 08/29/2023    2:24 AM 08/26/2023    3:36 AM  CBC  WBC 4.0 - 10.5 K/uL 13.8  8.3  9.3   Hemoglobin 13.0 - 17.0 g/dL 9.8  88.8  87.2   Hematocrit 39.0 - 52.0 % 30.6  34.7  38.2   Platelets 150 - 400 K/uL 491  488  447       Latest Ref Rng & Units 08/26/2023    3:36 AM 08/25/2023   10:56 AM 08/23/2023    5:00 AM  BMP  Glucose 70 - 99 mg/dL  864  887   BUN 8 - 23 mg/dL  21  22   Creatinine 9.38 - 1.24 mg/dL 9.13  9.21  9.21   Sodium 135 - 145 mmol/L  132  130   Potassium 3.5 - 5.1 mmol/L  4.1  4.1   Chloride 98 - 111 mmol/L  99  96   CO2 22 - 32 mmol/L  24  25   Calcium  8.9 - 10.3 mg/dL  8.6  8.6    Intake/Output      06/22 0701 06/23 0700 06/23 0701 06/24 0700   P.O. 960 580   IV Piggyback 400    Total Intake(mL/kg) 1360 (16.9) 580 (7.2)   Urine (mL/kg/hr) 350 (0.2) 525 (1.2)   Total Output 350 525   Net +1010 +55           Physical Exam: General: NAD.  Sitting up in bed, calm, comfortable  Resp: No increased wob Cardio: regular rate and rhythm ABD soft Neurologically intact MSK Neurovascularly intact Sensation intact distally Intact pulses distally Dorsiflexion/Plantar flexion intact Incision: dressing C/D/I Can easily do  SLR Erythema and ecchymosis to skin around incision continues to improve Nylons in place Effusion present but no fluid wave. Appears more subQ than intra-articular          Assessment: 4 Days Post-Op  S/P Procedure(s) (LRB): ARTHROSCOPY, KNEE WITH DEBRIDEMENT (Left) by Dr. Evalene BIRCH. Beverley on 08/29/23  Principal Problem:   Septic joint of left knee joint (HCC) Active Problems:   Dyslipidemia   Anxiety disorder   Depression   Hypertension   GERD (gastroesophageal reflux disease)   Coronary atherosclerosis   MSSA bacteremia   Paroxysmal atrial fibrillation (HCC)   DVT (deep venous thrombosis) (HCC)   Protein-calorie malnutrition, severe   Plan: IV ABX via PICC line per ID  Try to encourage less use of narcotics and more use of non-narcotic medicines like Robaxin , Tylenol , or Xanax.  Advance diet Up with therapy Incentive Spirometry Elevate and Apply ice  Weightbearing: WBAT LLE in knee brace Insicional and dressing care: Dressings left intact until follow-up and Reinforce dressings as needed Orthopedic device(s): knee brace Showering: Keep dressing dry VTE prophylaxis: switched to Eliquis  5mg  bid while inpatient, SCDs, ambulation Pain control: PRN  Dispo: TBD. Patient was hopeful to d/c home today but due to the elevated WBCs we want to redraw a new test and see if still elevated or not. If decreased then can d/c home today. If still elevated then will need to remain inpatient and have another arthroscopic wash out likely on Wednesday with Dr. Cristy.   Contact information:  Evalene Chancy MD, [address] [place], [place] Office [phone] 09/02/2023, 12:35 PM

## 2023-09-02 NOTE — Op Note (Signed)
 08/29/2023  7:54 AM  PATIENT:  Paul [name].    PRE-OPERATIVE DIAGNOSIS:  Septic Left Knee  POST-OPERATIVE DIAGNOSIS:  Same  PROCEDURE:  ARTHROSCOPY, KNEE WITH DEBRIDEMENT  SURGEON:  Evalene JONETTA Chancy, MD  ASSISTANT: Gerard [place], PA-C, he was present and scrubbed throughout the case, critical for completion in a timely fashion, and for retraction, instrumentation, and closure.   ANESTHESIA:   General  PREOPERATIVE INDICATIONS:  [name]. is a  78 y.o. male with a diagnosis of Septic Left Knee who failed conservative measures and elected for surgical management.    The risks benefits and alternatives were discussed with the patient preoperatively including but not limited to the risks of infection, bleeding, nerve injury, cardiopulmonary complications, the need for revision surgery, among others, and the patient was willing to proceed.  OPERATIVE IMPLANTS: None  OPERATIVE FINDINGS: Hemarthrosis left knee  BLOOD LOSS: Minimal  COMPLICATIONS: None  TOURNIQUET TIME: None  OPERATIVE PROCEDURE:  Patient was identified in the preoperative holding area and site was marked by me He was transported to the operating theater and placed on the table in supine position taking care to pad all bony prominences. After a preincinduction time out anesthesia was induced. The left lower extremity was prepped and draped in normal sterile fashion and a pre-incision timeout was performed. He received Ancef  for preoperative antibiotics.   I made a superior lateral inferior medial and inferior lateral arthroscopic portals into his knee I inserted the arthroscope and performed a synovectomy and debridement of the knee evacuating this abscess heme started this infection.  I ran 9 L of saline through his knee I did pay special attention to the posterior medial meniscus and just below this running suction there to evacuate any fluid collection in that area I used a large shaver to perform a  synovectomy of the knee.  Portals were then closed gentamicin  injected into the knee  POST OPERATIVE PLAN: Continue antibiotics weightbearing as tolerated advance activity with the knee
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
## 2023-09-02 NOTE — Plan of Care (Signed)
  Problem: Health Behavior/Discharge Planning: Goal: Ability to manage health-related needs will improve Outcome: Progressing   Problem: Clinical Measurements: Goal: Ability to maintain clinical measurements within normal limits will improve Outcome: Progressing   Problem: Activity: Goal: Risk for activity intolerance will decrease Outcome: Progressing   Problem: Pain Managment: Goal: General experience of comfort will improve and/or be controlled Outcome: Progressing   Problem: Safety: Goal: Ability to remain free from injury will improve Outcome: Progressing

## 2023-09-03 DIAGNOSIS — M00062 Staphylococcal arthritis, left knee: Secondary | ICD-10-CM | POA: Diagnosis not present

## 2023-09-03 DIAGNOSIS — F419 Anxiety disorder, unspecified: Secondary | ICD-10-CM | POA: Diagnosis not present

## 2023-09-03 DIAGNOSIS — I82A11 Acute embolism and thrombosis of right axillary vein: Secondary | ICD-10-CM | POA: Diagnosis not present

## 2023-09-03 DIAGNOSIS — F32A Depression, unspecified: Secondary | ICD-10-CM | POA: Diagnosis not present

## 2023-09-03 LAB — CBC
HCT: 32.7 % — ABNORMAL LOW (ref 39.0–52.0)
Hemoglobin: 10.4 g/dL — ABNORMAL LOW (ref 13.0–17.0)
MCH: 30.1 pg (ref 26.0–34.0)
MCHC: 31.8 g/dL (ref 30.0–36.0)
MCV: 94.8 fL (ref 80.0–100.0)
Platelets: 598 10*3/uL — ABNORMAL HIGH (ref 150–400)
RBC: 3.45 MIL/uL — ABNORMAL LOW (ref 4.22–5.81)
RDW: 12.5 % (ref 11.5–15.5)
WBC: 13.8 10*3/uL — ABNORMAL HIGH (ref 4.0–10.5)
nRBC: 0.1 % (ref 0.0–0.2)

## 2023-09-03 LAB — AEROBIC/ANAEROBIC CULTURE W GRAM STAIN (SURGICAL/DEEP WOUND): Culture: NO GROWTH

## 2023-09-03 MED ORDER — ACETAMINOPHEN 500 MG PO TABS
1000.0000 mg | ORAL_TABLET | Freq: Once | ORAL | Status: AC
Start: 2023-09-03 — End: 2023-09-03
  Administered 2023-09-03: 1000 mg via ORAL
  Filled 2023-09-03: qty 2

## 2023-09-03 MED ORDER — CEFAZOLIN SODIUM-DEXTROSE 2-4 GM/100ML-% IV SOLN
2.0000 g | INTRAVENOUS | Status: AC
  Administered 2023-09-04: 2 g via INTRAVENOUS
  Filled 2023-09-03: qty 100

## 2023-09-03 NOTE — Plan of Care (Signed)
  Problem: Health Behavior/Discharge Planning: Goal: Ability to manage health-related needs will improve Outcome: Progressing   Problem: Clinical Measurements: Goal: Ability to maintain clinical measurements within normal limits will improve Outcome: Progressing Goal: Will remain free from infection Outcome: Progressing Goal: Diagnostic test results will improve Outcome: Progressing Goal: Respiratory complications will improve Outcome: Progressing   Problem: Activity: Goal: Risk for activity intolerance will decrease Outcome: Adequate for Discharge   Problem: Elimination: Goal: Will not experience complications related to bowel motility Outcome: Completed/Met   Problem: Pain Managment: Goal: General experience of comfort will improve and/or be controlled Outcome: Progressing   Problem: Safety: Goal: Ability to remain free from injury will improve Outcome: Progressing

## 2023-09-03 NOTE — Plan of Care (Signed)
  Problem: Health Behavior/Discharge Planning: Goal: Ability to manage health-related needs will improve Outcome: Progressing   Problem: Activity: Goal: Risk for activity intolerance will decrease Outcome: Progressing   Problem: Pain Managment: Goal: General experience of comfort will improve and/or be controlled Outcome: Progressing   Problem: Safety: Goal: Ability to remain free from injury will improve Outcome: Progressing

## 2023-09-03 NOTE — Progress Notes (Signed)
 Consent form signed and placed in chart for procedure on 09/04/23

## 2023-09-03 NOTE — Progress Notes (Signed)
 Subjective: Patient reports pain as moderate in knee currently.   Arms much better now and hands appear back to normal.  Eating some but still not enough.  Urinating.  No CP, SOB.  Has mobilized OOB with PT but limited by pain.  Have adjusted narcotic orders some to try and wean him off a bit.  Elevation of WBCs for 3 days in a row.    Objective:   VITALS:   Vitals:   09/02/23 0612 09/02/23 0848 09/02/23 1257 09/02/23 2200  BP: 122/74  128/84 131/76  Pulse: 63  66 91  Resp: 16  16 17   Temp: 98.2 F (36.8 C)  98 F (36.7 C) 97.7 F (36.5 C)  TempSrc: Oral   Oral  SpO2: 98% 97% 100% 97%  Weight:      Height:          Latest Ref Rng & Units 09/03/2023   10:23 AM 09/02/2023    5:09 PM 09/01/2023    3:10 AM  CBC  WBC 4.0 - 10.5 K/uL 13.8  15.1  13.8   Hemoglobin 13.0 - 17.0 g/dL 89.5  89.7  9.8   Hematocrit 39.0 - 52.0 % 32.7  32.1  30.6   Platelets 150 - 400 K/uL 598  694  491       Latest Ref Rng & Units 08/26/2023    3:36 AM 08/25/2023   10:56 AM 08/23/2023    5:00 AM  BMP  Glucose 70 - 99 mg/dL  864  887   BUN 8 - 23 mg/dL  21  22   Creatinine 9.38 - 1.24 mg/dL 9.13  9.21  9.21   Sodium 135 - 145 mmol/L  132  130   Potassium 3.5 - 5.1 mmol/L  4.1  4.1   Chloride 98 - 111 mmol/L  99  96   CO2 22 - 32 mmol/L  24  25   Calcium  8.9 - 10.3 mg/dL  8.6  8.6    Intake/Output      06/23 0701 06/24 0700 06/24 0701 06/25 0700   P.O. 580 120   IV Piggyback 660.6    Total Intake(mL/kg) 1240.6 (15.4) 120 (1.5)   Urine (mL/kg/hr) 2075 (1.1) 525 (1.5)   Stool 0    Total Output 2075 525   Net -834.4 -405        Urine Occurrence 3 x    Stool Occurrence 0 x       Physical Exam: General: NAD.  Laying in bed, calm, comfortable but emotional Resp: No increased wob Cardio: regular rate and rhythm ABD soft Neurologically intact MSK Neurovascularly intact Sensation intact distally Intact pulses distally Dorsiflexion/Plantar flexion intact Incision: dressing  C/D/I Can do SLR Erythema and ecchymosis to skin around incision continues to improve Nylons in place Edema present but no fluid wave. Appears more subQ than intra-articular        Assessment: 5 Days Post-Op  S/P Procedure(s) (LRB): ARTHROSCOPY, KNEE WITH DEBRIDEMENT (Left) by Dr. Evalene BIRCH. Beverley on 08/29/23  Principal Problem:   Septic joint of left knee joint (HCC) Active Problems:   Dyslipidemia   Anxiety disorder   Depression   Hypertension   GERD (gastroesophageal reflux disease)   Coronary atherosclerosis   MSSA bacteremia   Paroxysmal atrial fibrillation (HCC)   DVT (deep venous thrombosis) (HCC)   Protein-calorie malnutrition, severe   Plan: Due to elevating WBCs will plan to take patient back to OR Wednesday with Dr. Cristy for repeat  arthroscopic I&D. NPO at midnight.  IV ABX via PICC line per ID  Will remove Nylon sutures today  Advance diet Up with therapy Incentive Spirometry Elevate and Apply ice  Weightbearing: WBAT LLE in knee brace Insicional and dressing care: Dressings left intact until follow-up and Reinforce dressings as needed Orthopedic device(s): knee brace Showering: Keep dressing dry VTE prophylaxis: switched to Eliquis  5mg  bid while inpatient, SCDs, ambulation Pain control: PRN  Dispo: TBD. Patient was hopeful to d/c home but due to the elevated WBCs will need to remain inpatient and have another arthroscopic wash out likely on Wednesday with Dr. Cristy.   Contact information:  Evalene Chancy MD, [address] [place], [place] Office [phone] 09/03/2023, 11:29 AM
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
## 2023-09-03 NOTE — Progress Notes (Signed)
 PHYSICAL THERAPY  Pt stated he just got back to bed from using the bathroom which he was excited to be able to do.  Now requesting to rest.  Will attempt to see another day as schedule permits.  Katheryn Leap  PTA Acute  Rehabilitation Services Office M-F          [phone]

## 2023-09-03 NOTE — Anesthesia Preprocedure Evaluation (Signed)
 Anesthesia Evaluation  Patient identified by MRN, date of birth, ID band Patient awake    Reviewed: Allergy & Precautions, NPO status , Patient's Chart, lab work & pertinent test results, reviewed documented beta blocker date and time   Airway Mallampati: II  TM Distance: >3 FB Neck ROM: Full    Dental no notable dental hx. (+) Teeth Intact, Dental Advisory Given, Caps   Pulmonary asthma , COPD,  COPD inhaler, former smoker   Pulmonary exam normal breath sounds clear to auscultation       Cardiovascular hypertension, Pt. on medications and Pt. on home beta blockers + CAD  Normal cardiovascular exam Rhythm:Regular Rate:Normal     Neuro/Psych  Headaches PSYCHIATRIC DISORDERS Anxiety Depression     Neuromuscular disease    GI/Hepatic ,GERD  Medicated,,(+) Hepatitis -, C  Endo/Other  negative endocrine ROS    Renal/GU Renal diseaseLab Results      Component                Value               Date                              K                        4.1                 08/25/2023                           CREATININE               0.86                08/26/2023                      Musculoskeletal  (+) Arthritis ,  Septic Left Knee   Abdominal   Peds  (+) ATTENTION DEFICIT DISORDER WITHOUT HYPERACTIVITY Hematology  (+) Blood dyscrasia, anemia Lab Results      Component                Value               Date                      WBC                      13.8 (H)            09/03/2023                HGB                      10.4 (L)            09/03/2023                HCT                      32.7 (L)            09/03/2023                MCV  94.8                09/03/2023                PLT                      598 (H)             09/03/2023              Anesthesia Other Findings Day of surgery medications reviewed with the patient.  Reproductive/Obstetrics                              Anesthesia Physical Anesthesia Plan  ASA: 3  Anesthesia Plan: General   Post-op Pain Management: Precedex  and Ofirmev  IV (intra-op)*   Induction: Intravenous  PONV Risk Score and Plan: 3 and Treatment may vary due to age or medical condition and Ondansetron   Airway Management Planned: LMA  Additional Equipment: None  Intra-op Plan:   Post-operative Plan: Extubation in OR  Informed Consent: I have reviewed the patients History and Physical, chart, labs and discussed the procedure including the risks, benefits and alternatives for the proposed anesthesia with the patient or authorized representative who has indicated his/her understanding and acceptance.     Dental advisory given  Plan Discussed with: CRNA and Surgeon  Anesthesia Plan Comments:         Anesthesia Quick Evaluation

## 2023-09-03 NOTE — TOC Progression Note (Signed)
 Transition of Care (TOC) - Progression Note    Patient Details  Name: Paul [name]. MRN: [id number] Date of Birth: Jan 26, 1946  Transition [facility]) CM/SW Contact  Alfonse JONELLE Rex, RN Phone Number: 09/03/2023, 10:46 AM  Clinical Narrative:  Remains inpatient not medically stable for dc, WBC elevated, per bedside nurse, possible back to OR for another I&D of knee. TOC will continue to follow.      Expected Discharge Plan: Home w Home Health Services Barriers to Discharge: Continued Medical Work up  Expected Discharge Plan and Services       Living arrangements for the past 2 months: Single Family Home                                       Social Determinants of Health (SDOH) Interventions SDOH Screenings   Food Insecurity: No Food Insecurity (08/19/2023)  Housing: Low Risk  (08/19/2023)  Transportation Needs: No Transportation Needs (08/19/2023)  Utilities: Not At Risk (08/19/2023)  Depression (PHQ2-9): Low Risk  (04/16/2023)  Social Connections: Moderately Isolated (08/19/2023)  Tobacco Use: Medium Risk (08/29/2023)    Readmission Risk Interventions    08/20/2023   11:26 AM 05/01/2023    1:40 PM  Readmission Risk Prevention Plan  Transportation Screening Complete Complete  PCP or Specialist Appt within 5-7 Days  Complete  PCP or Specialist Appt within 3-5 Days Complete   Home Care Screening  Complete  Medication Review (RN CM)  Complete  HRI or Home Care Consult Complete   Social Work Consult for Recovery Care Planning/Counseling Complete   Palliative Care Screening Not Applicable   Medication Review Oceanographer) Complete

## 2023-09-04 ENCOUNTER — Inpatient Hospital Stay (HOSPITAL_COMMUNITY): Payer: Self-pay | Admitting: Anesthesiology

## 2023-09-04 ENCOUNTER — Encounter (HOSPITAL_COMMUNITY)
Admission: AD | Disposition: A | Discharge status: Home Health | Payer: Self-pay | Source: Ambulatory Visit | Attending: Orthopedic Surgery

## 2023-09-04 ENCOUNTER — Encounter (HOSPITAL_COMMUNITY): Payer: Self-pay | Admitting: Orthopedic Surgery

## 2023-09-04 DIAGNOSIS — M01X62 Direct infection of left knee in infectious and parasitic diseases classified elsewhere: Secondary | ICD-10-CM | POA: Diagnosis not present

## 2023-09-04 DIAGNOSIS — I2510 Atherosclerotic heart disease of native coronary artery without angina pectoris: Secondary | ICD-10-CM | POA: Diagnosis not present

## 2023-09-04 DIAGNOSIS — I10 Essential (primary) hypertension: Secondary | ICD-10-CM | POA: Diagnosis not present

## 2023-09-04 DIAGNOSIS — M25062 Hemarthrosis, left knee: Secondary | ICD-10-CM | POA: Diagnosis not present

## 2023-09-04 HISTORY — PX: KNEE ARTHROSCOPY: SHX127

## 2023-09-04 SURGERY — ARTHROSCOPY, KNEE
Anesthesia: General | Site: Knee | Laterality: Left

## 2023-09-04 MED ORDER — FENTANYL CITRATE PF 50 MCG/ML IJ SOSY
PREFILLED_SYRINGE | INTRAMUSCULAR | Status: AC
  Filled 2023-09-04: qty 1

## 2023-09-04 MED ORDER — FENTANYL CITRATE (PF) 100 MCG/2ML IJ SOLN
INTRAMUSCULAR | Status: AC
  Filled 2023-09-04: qty 2

## 2023-09-04 MED ORDER — LIDOCAINE 2% (20 MG/ML) 5 ML SYRINGE
INTRAMUSCULAR | Status: DC | PRN
  Administered 2023-09-04: 4 mg via INTRAVENOUS
  Administered 2023-09-04: 100 mg via INTRAVENOUS

## 2023-09-04 MED ORDER — LACTATED RINGERS IV SOLN: INTRAVENOUS | Status: DC | PRN

## 2023-09-04 MED ORDER — ACETAMINOPHEN 10 MG/ML IV SOLN
INTRAVENOUS | Status: AC
  Filled 2023-09-04: qty 100

## 2023-09-04 MED ORDER — DEXAMETHASONE SODIUM PHOSPHATE 10 MG/ML IJ SOLN
INTRAMUSCULAR | Status: DC | PRN
  Administered 2023-09-04: 5 mg via INTRAVENOUS

## 2023-09-04 MED ORDER — BUPIVACAINE HCL (PF) 0.25 % IJ SOLN
INTRAMUSCULAR | Status: AC
  Filled 2023-09-04: qty 30

## 2023-09-04 MED ORDER — SODIUM CHLORIDE 0.9 % IR SOLN
Status: DC | PRN
  Administered 2023-09-04: 3000 mL

## 2023-09-04 MED ORDER — ACETAMINOPHEN 10 MG/ML IV SOLN
1000.0000 mg | Freq: Once | INTRAVENOUS | Status: DC | PRN
  Administered 2023-09-04: 1000 mg via INTRAVENOUS

## 2023-09-04 MED ORDER — PROPOFOL 10 MG/ML IV BOLUS
INTRAVENOUS | Status: DC | PRN
  Administered 2023-09-04: 160 mg via INTRAVENOUS

## 2023-09-04 MED ORDER — PROPOFOL 10 MG/ML IV BOLUS
INTRAVENOUS | Status: AC
  Filled 2023-09-04: qty 20

## 2023-09-04 MED ORDER — ONDANSETRON HCL 4 MG/2ML IJ SOLN: 4.0000 mg | Freq: Once | INTRAMUSCULAR | Status: DC | PRN

## 2023-09-04 MED ORDER — HYDROMORPHONE HCL 2 MG/ML IJ SOLN
INTRAMUSCULAR | Status: AC
  Filled 2023-09-04: qty 1

## 2023-09-04 MED ORDER — APIXABAN 5 MG PO TABS
5.0000 mg | ORAL_TABLET | Freq: Two times a day (BID) | ORAL | Status: DC
  Administered 2023-09-05: 5 mg via ORAL
  Filled 2023-09-04: qty 1

## 2023-09-04 MED ORDER — ONDANSETRON HCL 4 MG/2ML IJ SOLN
INTRAMUSCULAR | Status: DC | PRN
Start: 2023-09-04 — End: 2023-09-04
  Administered 2023-09-04: 4 mg via INTRAVENOUS

## 2023-09-04 MED ORDER — BUPIVACAINE HCL 0.25 % IJ SOLN
INTRAMUSCULAR | Status: DC | PRN
Start: 2023-09-04 — End: 2023-09-04
  Administered 2023-09-04: 30 mL

## 2023-09-04 MED ORDER — FENTANYL CITRATE PF 50 MCG/ML IJ SOSY
25.0000 ug | PREFILLED_SYRINGE | INTRAMUSCULAR | Status: DC | PRN
  Administered 2023-09-04 (×3): 50 ug via INTRAVENOUS

## 2023-09-04 MED ORDER — HYDROMORPHONE HCL 1 MG/ML IJ SOLN
INTRAMUSCULAR | Status: DC | PRN
  Administered 2023-09-04: .5 mg via INTRAVENOUS
  Administered 2023-09-04 (×2): 1 mg via INTRAVENOUS
  Administered 2023-09-04: .5 mg via INTRAVENOUS

## 2023-09-04 MED ORDER — FENTANYL CITRATE (PF) 100 MCG/2ML IJ SOLN
INTRAMUSCULAR | Status: DC | PRN
  Administered 2023-09-04 (×2): 50 ug via INTRAVENOUS

## 2023-09-04 MED ORDER — DEXAMETHASONE SODIUM PHOSPHATE 10 MG/ML IJ SOLN
INTRAMUSCULAR | Status: AC
  Filled 2023-09-04: qty 1

## 2023-09-04 MED ORDER — EPINEPHRINE PF 1 MG/ML IJ SOLN
INTRAMUSCULAR | Status: AC
  Filled 2023-09-04: qty 1

## 2023-09-04 MED ORDER — GENTAMICIN SULFATE 40 MG/ML IJ SOLN
80.0000 mg | Freq: Once | INTRAMUSCULAR | Status: AC
  Administered 2023-09-04: 80 mg via INTRAMUSCULAR
  Filled 2023-09-04: qty 2

## 2023-09-04 MED ORDER — ONDANSETRON HCL 4 MG/2ML IJ SOLN
INTRAMUSCULAR | Status: AC
  Filled 2023-09-04: qty 2

## 2023-09-04 SURGICAL SUPPLY — 40 items
BAG COUNTER SPONGE SURGICOUNT (BAG) ×1 IMPLANT
BANDAGE ESMARK 6X9 LF (GAUZE/BANDAGES/DRESSINGS) IMPLANT
BLADE CLIPPER SURG (BLADE) IMPLANT
BLADE EXCALIBUR 4.0X13 (MISCELLANEOUS) IMPLANT
BNDG ELASTIC 6INX 5YD STR LF (GAUZE/BANDAGES/DRESSINGS) IMPLANT
CNTNR URN SCR LID CUP LEK RST (MISCELLANEOUS) IMPLANT
COVER SURGICAL LIGHT HANDLE (MISCELLANEOUS) ×1 IMPLANT
CUFF TRNQT CYL 34X4.125X (TOURNIQUET CUFF) IMPLANT
DRAPE ARTHROSCOPY W/POUCH 114 (DRAPES) ×1 IMPLANT
DRAPE POUCH INSTRU U-SHP 10X18 (DRAPES) ×1 IMPLANT
DRAPE U-SHAPE 47X51 STRL (DRAPES) IMPLANT
DURAPREP 26ML APPLICATOR (WOUND CARE) ×1 IMPLANT
ELECT PENCIL ROCKER SW 15FT (MISCELLANEOUS) ×1 IMPLANT
EVACUATOR 1/8 PVC DRAIN (DRAIN) IMPLANT
GAUZE 4X4 16PLY ~~LOC~~+RFID DBL (SPONGE) ×1 IMPLANT
GAUZE PAD ABD 8X10 STRL (GAUZE/BANDAGES/DRESSINGS) IMPLANT
GAUZE SPONGE 4X4 12PLY STRL (GAUZE/BANDAGES/DRESSINGS) IMPLANT
GAUZE XEROFORM 1X8 LF (GAUZE/BANDAGES/DRESSINGS) ×1 IMPLANT
GLOVE BIO SURGEON STRL SZ 6.5 (GLOVE) ×2 IMPLANT
GLOVE BIOGEL PI IND STRL 6.5 (GLOVE) ×1 IMPLANT
GLOVE BIOGEL PI IND STRL 8 (GLOVE) ×1 IMPLANT
GOWN STRL REUS W/ TWL LRG LVL3 (GOWN DISPOSABLE) ×2 IMPLANT
GOWN STRL REUS W/ TWL XL LVL3 (GOWN DISPOSABLE) ×1 IMPLANT
KIT TURNOVER KIT A (KITS) ×1 IMPLANT
MANIFOLD NEPTUNE II (INSTRUMENTS) ×1 IMPLANT
NS IRRIG 1000ML POUR BTL (IV SOLUTION) IMPLANT
PACK ARTHROSCOPY WL (CUSTOM PROCEDURE TRAY) ×1 IMPLANT
PAD ARMBOARD POSITIONER FOAM (MISCELLANEOUS) ×2 IMPLANT
PADDING CAST COTTON 6X4 STRL (CAST SUPPLIES) IMPLANT
SPONGE T-LAP 4X18 ~~LOC~~+RFID (SPONGE) ×1 IMPLANT
STRIP CLOSURE SKIN 1/2X4 (GAUZE/BANDAGES/DRESSINGS) ×1 IMPLANT
SUT ETHILON 3 0 PS 1 (SUTURE) IMPLANT
SUT MNCRL AB 4-0 PS2 18 (SUTURE) ×1 IMPLANT
SYR CONTROL 10ML LL (SYRINGE) ×1 IMPLANT
TOWEL OR 17X26 10 PK STRL BLUE (TOWEL DISPOSABLE) ×2 IMPLANT
TUBE SUCTION HIGH CAP CLEAR NV (SUCTIONS) ×1 IMPLANT
TUBING ARTHROSCOPY IRRIG 16FT (MISCELLANEOUS) ×1 IMPLANT
TUBING CONNECTING 10 (TUBING) ×1 IMPLANT
WAND STAR VAC 90 (SURGICAL WAND) IMPLANT
WATER STERILE IRR 1000ML POUR (IV SOLUTION) ×1 IMPLANT

## 2023-09-04 NOTE — Progress Notes (Signed)
 PT Cancellation Note  Patient Details Name: Paul [name]. MRN: [id number] DOB: 1945/08/26   Cancelled Treatment:    Reason Eval/Treat Not Completed: Patient at procedure or test/unavailable. Pt taken back to OR this am.    [facility] 09/04/2023, 10:01 AM

## 2023-09-04 NOTE — Transfer of Care (Signed)
 Immediate Anesthesia Transfer of Care Note  Patient: Paul [name].  Procedure(s) Performed: ARTHROSCOPY, KNEE (Left: Knee)  Patient Location: PACU  Anesthesia Type:General  Level of Consciousness: awake, alert , oriented, and patient cooperative  Airway & Oxygen Therapy: Patient Spontanous Breathing  Post-op Assessment: Report given to RN and Post -op Vital signs reviewed and stable  Post vital signs: Reviewed and stable  Last Vitals:  Vitals Value Taken Time  BP 161/82 09/04/23 12:33  Temp    Pulse 62 09/04/23 12:36  Resp 14 09/04/23 12:36  SpO2 100 % 09/04/23 12:36  Vitals shown include unfiled device data.  Last Pain:  Vitals:   09/04/23 0941  TempSrc: Oral  PainSc:       Patients Stated Pain Goal: 4 (09/04/23 0333)  Complications: No notable events documented.

## 2023-09-04 NOTE — Anesthesia Postprocedure Evaluation (Signed)
 Anesthesia Post Note  Patient: Paul [name].  Procedure(s) Performed: ARTHROSCOPY, KNEE (Left: Knee)     Patient location during evaluation: PACU Anesthesia Type: General Level of consciousness: awake and alert Pain management: pain level controlled Vital Signs Assessment: post-procedure vital signs reviewed and stable Respiratory status: spontaneous breathing, nonlabored ventilation, respiratory function stable and patient connected to nasal cannula oxygen Cardiovascular status: blood pressure returned to baseline and stable Postop Assessment: no apparent nausea or vomiting Anesthetic complications: no  No notable events documented.  Last Vitals:  Vitals:   09/04/23 1315 09/04/23 1330  BP: (!) 154/89 (!) 150/78  Pulse: 63 (!) 57  Resp: 14 12  Temp:    SpO2: 100% 100%    Last Pain:  Vitals:   09/04/23 1333  TempSrc:   PainSc: 5                  Garnette DELENA Gab

## 2023-09-04 NOTE — Op Note (Signed)
 Orthopaedic Surgery Operative Note (CSN: [id number])  Paul [name].  [birth year] Date of Surgery: 09/04/2023   Diagnoses:  SEPTIC LEFT KNEE  Procedure: Left knee synovectomy and synovial biopsy   Operative Finding I was asked to help in the care of this patient by one of my partners due to surgical availability.  He had a large area of swelling superior lateral that appeared to be hematoma.  There is no gross sign of infection today.  Significant blood within the joint but no obvious infectious material.  We irrigated with 3 L normal saline and performed a synovectomy and the knee.  Is relatively stiff with flexion only about 70 degrees.  We did not consider manipulation secondary to his recent extensor mechanism repair.  Post-operative plan: The patient will be touchdown weightbearing.  The patient will be readmitted to the floor.  DVT prophylaxis Lovenox  40 mg/day until mobilizing and then consider transition in clinic to alternative medicines.  Pain control with PRN pain medication preferring oral medicines.  Follow up plan to be determined  Post-Op Diagnosis: Same Surgeons:Primary: Cristy Bonner DASEN, MD Assistants:Kirstin Shepperson, PA-C Location: [facility] ROOM 06 Anesthesia: General with local anesthetic Antibiotics: Ancef  2 g, injectable gentamicin  Tourniquet time:  Total Tourniquet Time Documented: Thigh (Left) - 14 minutes Total: Thigh (Left) - 14 minutes  Estimated Blood Loss: Minimal Complications: None Specimens: 2 for culture Implants: * No implants in log *  Indications for Surgery:   Paul Mo. is a 78 y.o. male with previous extensor mechanism repair by one of my partners and I was asked to help with a washout in the setting of septic knee.  Benefits and risks of operative and nonoperative management were discussed prior to surgery with patient/guardian(s) and informed consent form was completed.  Specific risks including infection, need for additional surgery,  need for additional care, osteomyelitis and continued infection.   Procedure:   The patient was identified properly. Informed consent was obtained and the surgical site was marked. The patient was taken up to suite where general anesthesia was induced. The patient was placed in the supine position with a post against the surgical leg and a nonsterile tourniquet applied. The surgical leg was then prepped and draped usual sterile fashion.  A standard surgical timeout was performed.  2 standard anterior portals were made and diagnostic arthroscopy performed. Please note the findings as noted above.  We began by using the previous portals anterior lateral and medial as well as superior lateral.  We bluntly dissected through the previous recently's portals and noted there was blood that was withdrawn.  We then placed our scope and performed extensive synovectomy of the anterior, suprapatellar, medial and lateral compartments of the knee and took specimens for culture.  We irrigated with 3 L normal saline.  Incisions closed with nonabsorbable suture. The patient was awoken from general anesthesia and taken to the PACU in stable condition without complication.   Kirstin Shepperson, PA-C, present and scrubbed throughout the case, critical for completion in a timely fashion, and for retraction, instrumentation, closure.

## 2023-09-04 NOTE — Plan of Care (Signed)
  Problem: Health Behavior/Discharge Planning: Goal: Ability to manage health-related needs will improve Outcome: Adequate for Discharge   Problem: Clinical Measurements: Goal: Ability to maintain clinical measurements within normal limits will improve Outcome: Progressing Goal: Will remain free from infection Outcome: Progressing Goal: Diagnostic test results will improve Outcome: Progressing Goal: Respiratory complications will improve Outcome: Progressing   Problem: Activity: Goal: Risk for activity intolerance will decrease Outcome: Progressing   Problem: Pain Managment: Goal: General experience of comfort will improve and/or be controlled Outcome: Progressing   Problem: Safety: Goal: Ability to remain free from injury will improve Outcome: Progressing

## 2023-09-04 NOTE — Interval H&P Note (Signed)
 All questions answered

## 2023-09-04 NOTE — Anesthesia Procedure Notes (Signed)
 Procedure Name: LMA Insertion Date/Time: 09/04/2023 11:15 AM  Performed by: Franchot Delon RAMAN, CRNAPre-anesthesia Checklist: Patient identified, Emergency Drugs available, Suction available and Patient being monitored Patient Re-evaluated:Patient Re-evaluated prior to induction Oxygen Delivery Method: Circle System Utilized Preoxygenation: Pre-oxygenation with 100% oxygen Induction Type: IV induction Ventilation: Mask ventilation without difficulty LMA: LMA inserted LMA Size: 4.0 Number of attempts: 1 Placement Confirmation: positive ETCO2 Tube secured with: Tape Dental Injury: Teeth and Oropharynx as per pre-operative assessment
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
## 2023-09-04 NOTE — Plan of Care (Signed)
  Problem: Health Behavior/Discharge Planning: Goal: Ability to manage health-related needs will improve Outcome: Progressing   Problem: Clinical Measurements: Goal: Ability to maintain clinical measurements within normal limits will improve Outcome: Progressing   Problem: Activity: Goal: Risk for activity intolerance will decrease Outcome: Progressing   Problem: Pain Managment: Goal: General experience of comfort will improve and/or be controlled Outcome: Progressing

## 2023-09-05 ENCOUNTER — Encounter (HOSPITAL_COMMUNITY): Payer: Self-pay | Admitting: Orthopaedic Surgery

## 2023-09-05 ENCOUNTER — Ambulatory Visit: Admitting: Family Medicine

## 2023-09-05 DIAGNOSIS — M009 Pyogenic arthritis, unspecified: Secondary | ICD-10-CM | POA: Diagnosis not present

## 2023-09-05 DIAGNOSIS — A499 Bacterial infection, unspecified: Secondary | ICD-10-CM | POA: Diagnosis not present

## 2023-09-05 LAB — CBC WITH DIFFERENTIAL/PLATELET
Abs Immature Granulocytes: 1.94 10*3/uL — ABNORMAL HIGH (ref 0.00–0.07)
Abs Immature Granulocytes: 1.99 10*3/uL — ABNORMAL HIGH (ref 0.00–0.07)
Basophils Absolute: 0.1 10*3/uL (ref 0.0–0.1)
Basophils Absolute: 0.1 10*3/uL (ref 0.0–0.1)
Basophils Relative: 0 %
Basophils Relative: 0 %
Eosinophils Absolute: 0.2 10*3/uL (ref 0.0–0.5)
Eosinophils Absolute: 0.2 10*3/uL (ref 0.0–0.5)
Eosinophils Relative: 1 %
Eosinophils Relative: 1 %
HCT: 35.2 % — ABNORMAL LOW (ref 39.0–52.0)
HCT: 34.4 % — ABNORMAL LOW (ref 39.0–52.0)
Hemoglobin: 11.1 g/dL — ABNORMAL LOW (ref 13.0–17.0)
Hemoglobin: 11 g/dL — ABNORMAL LOW (ref 13.0–17.0)
Immature Granulocytes: 11 %
Immature Granulocytes: 11 %
Lymphocytes Relative: 16 %
Lymphocytes Relative: 15 %
Lymphs Abs: 2.8 10*3/uL (ref 0.7–4.0)
Lymphs Abs: 2.8 10*3/uL (ref 0.7–4.0)
MCH: 30.7 pg (ref 26.0–34.0)
MCH: 30.7 pg (ref 26.0–34.0)
MCHC: 31.5 g/dL (ref 30.0–36.0)
MCHC: 32 g/dL (ref 30.0–36.0)
MCV: 97.5 fL (ref 80.0–100.0)
MCV: 96.1 fL (ref 80.0–100.0)
Monocytes Absolute: 2 10*3/uL — ABNORMAL HIGH (ref 0.1–1.0)
Monocytes Absolute: 2.1 10*3/uL — ABNORMAL HIGH (ref 0.1–1.0)
Monocytes Relative: 11 %
Monocytes Relative: 12 %
Neutro Abs: 10.8 10*3/uL — ABNORMAL HIGH (ref 1.7–7.7)
Neutro Abs: 11.2 10*3/uL — ABNORMAL HIGH (ref 1.7–7.7)
Neutrophils Relative %: 61 %
Neutrophils Relative %: 61 %
Platelets: 634 10*3/uL — ABNORMAL HIGH (ref 150–400)
Platelets: 744 10*3/uL — ABNORMAL HIGH (ref 150–400)
RBC: 3.61 MIL/uL — ABNORMAL LOW (ref 4.22–5.81)
RBC: 3.58 MIL/uL — ABNORMAL LOW (ref 4.22–5.81)
RDW: 13 % (ref 11.5–15.5)
RDW: 13 % (ref 11.5–15.5)
WBC: 17.7 10*3/uL — ABNORMAL HIGH (ref 4.0–10.5)
WBC: 18.4 10*3/uL — ABNORMAL HIGH (ref 4.0–10.5)
nRBC: 0.1 % (ref 0.0–0.2)
nRBC: 0.1 % (ref 0.0–0.2)

## 2023-09-05 LAB — COMPREHENSIVE METABOLIC PANEL WITH GFR
ALT: 19 U/L (ref 0–44)
AST: 30 U/L (ref 15–41)
Albumin: 2.7 g/dL — ABNORMAL LOW (ref 3.5–5.0)
Alkaline Phosphatase: 67 U/L (ref 38–126)
Anion gap: 9 (ref 5–15)
BUN: 30 mg/dL — ABNORMAL HIGH (ref 8–23)
CO2: 23 mmol/L (ref 22–32)
Calcium: 8.3 mg/dL — ABNORMAL LOW (ref 8.9–10.3)
Chloride: 102 mmol/L (ref 98–111)
Creatinine, Ser: 0.87 mg/dL (ref 0.61–1.24)
GFR, Estimated: >60 mL/min (ref >60)
Glucose, Bld: 113 mg/dL — ABNORMAL HIGH (ref 70–99)
Potassium: 3.7 mmol/L (ref 3.5–5.1)
Sodium: 134 mmol/L — ABNORMAL LOW (ref 135–145)
Total Bilirubin: 0.6 mg/dL (ref 0.0–1.2)
Total Protein: 6.1 g/dL — ABNORMAL LOW (ref 6.5–8.1)

## 2023-09-05 MED ORDER — HEPARIN SOD (PORK) LOCK FLUSH 100 UNIT/ML IV SOLN
250.0000 [IU] | INTRAVENOUS | Status: AC | PRN
Start: 2023-09-05 — End: 2023-09-05
  Administered 2023-09-05: 250 [IU]

## 2023-09-05 NOTE — Discharge Summary (Signed)
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 root, measuring 41 mm. There is mild (Grade II) layered plaque involving the descending aorta. Venous: The left lower pulmonary vein and left upper pulmonary vein are normal. IAS/Shunts: The interatrial septum appears to be lipomatous. The atrial septum is grossly normal. Agitated saline contrast was given intravenously to evaluate for intracardiac shunting. Agitated saline contrast bubble study was negative, with no evidence  of any interatrial shunt. Additional Comments: Spectral Doppler performed. LEFT VENTRICLE PLAX 2D LVOT diam:     2.30 cm LV SV:         44 LV SV Index:   22 LVOT Area:     4.15 cm  AORTIC VALVE AV Area (Vmax):    2.29 cm AV Area (Vmean):   2.02 cm AV Area (VTI):     1.87 cm AV Vmax:           114.00 cm/s AV Vmean:          80.000 cm/s AV VTI:             0.238 m AV Peak Grad:      5.2 mmHg AV Mean Grad:      3.0 mmHg LVOT Vmax:         62.70 cm/s LVOT Vmean:        38.900 cm/s LVOT VTI:          0.107 m LVOT/AV VTI ratio: 0.45  AORTA Ao Root diam: 4.10 cm Ao Asc diam:  3.60 cm  SHUNTS Systemic VTI:  0.11 m Systemic Diam: 2.30 cm Sunit Tolia Electronically signed by Madonna Large Signature Date/Time: 08/15/2023/2:07:28 PM    Final    EP STUDY Result Date: 08/15/2023 See surgical note for result.  ECHOCARDIOGRAM COMPLETE BUBBLE STUDY Result Date: 08/14/2023    ECHOCARDIOGRAM REPORT   Patient Name:   Paul [name]. Date of Exam: 08/14/2023 Medical Rec #:  [id number]            Height:       72.0 in Accession #:    [id number]           Weight:       178.0 lb Date of Birth:  1946-01-06            BSA:          2.028 m Patient Age:    78 years             BP:           115/85 mmHg Patient Gender: M                    HR:           59 bpm. Exam Location:  Inpatient Procedure: 2D Echo, Cardiac Doppler and Color Doppler (Both Spectral and Color            Flow Doppler were utilized during procedure). Indications:    MSSA bacteremia  History:        Patient has prior history of Echocardiogram examinations, most                 recent 05/03/2023. Signs/Symptoms:Syncope; Risk                 Factors:Hypertension.  Sonographer:    Philomena Daring Referring Phys: [id number] [facility] M Ajanay Farve IMPRESSIONS  1. Left ventricular ejection fraction, by estimation, is 60 to 65%. The left ventricle has normal function. The left ventricle has no regional wall motion abnormalities. There is mild concentric left ventricular hypertrophy. Left ventricular diastolic parameters were normal.  2. Right ventricular systolic function is normal. The right ventricular size is normal.  3. The mitral valve is normal in structure. Mild mitral valve regurgitation. No evidence of mitral stenosis.  4. The aortic valve is tricuspid. There is mild thickening of the aortic valve. Aortic valve regurgitation is  not visualized. Aortic valve sclerosis/calcification is present, without any evidence of aortic stenosis.  5. The inferior vena cava is normal in size with greater than 50% respiratory variability, suggesting right atrial pressure of 3 mmHg.  6. Agitated saline contrast bubble study was negative, with no evidence of any interatrial shunt. Conclusion(s)/Recommendation(s): No evidence of valvular vegetations on this transthoracic echocardiogram. Consider a transesophageal echocardiogram to exclude infective endocarditis if clinically indicated. FINDINGS  Left Ventricle: Left ventricular ejection fraction, by estimation, is 60 to 65%. The left ventricle has normal function. The left ventricle has no regional wall motion abnormalities. The left ventricular internal cavity size was normal in size. There is  mild concentric left ventricular hypertrophy. Left ventricular diastolic parameters were normal. Right Ventricle: The right ventricular size is normal. No increase in right ventricular wall thickness. Right ventricular systolic function is normal. Left Atrium: Left atrial size was normal in size. Right Atrium: Right atrial size was normal in size. Pericardium: There is no evidence of pericardial effusion. Mitral Valve: The mitral valve is normal in structure. Mild mitral valve regurgitation. No evidence  of mitral valve stenosis. Tricuspid Valve: The tricuspid valve is normal in structure. Tricuspid valve regurgitation is not demonstrated. No evidence of tricuspid stenosis. Aortic Valve: The aortic valve is tricuspid. There is mild thickening of the aortic valve. Aortic valve regurgitation is not visualized. Aortic valve sclerosis/calcification is present, without any evidence of aortic stenosis. Pulmonic Valve: The pulmonic valve was normal in structure. Pulmonic valve regurgitation is mild. No evidence of pulmonic stenosis. Aorta: The aortic root is normal in size and structure. Venous: The inferior vena cava is  normal in size with greater than 50% respiratory variability, suggesting right atrial pressure of 3 mmHg. IAS/Shunts: No atrial level shunt detected by color flow Doppler. Agitated saline contrast bubble study was negative, with no evidence of any interatrial shunt.  LEFT VENTRICLE PLAX 2D LVIDd:         3.60 cm   Diastology LVIDs:         2.40 cm   LV e' medial:    10.00 cm/s LV PW:         1.50 cm   LV E/e' medial:  9.3 LV IVS:        1.50 cm   LV e' lateral:   10.80 cm/s LVOT diam:     2.00 cm   LV E/e' lateral: 8.6 LV SV:         83 LV SV Index:   41 LVOT Area:     3.14 cm  RIGHT VENTRICLE             IVC RV S prime:     16.00 cm/s  IVC diam: 2.00 cm TAPSE (M-mode): 2.7 cm LEFT ATRIUM           Index        RIGHT ATRIUM           Index LA diam:      3.70 cm 1.82 cm/m   RA Area:     13.30 cm LA Vol (A2C): 34.9 ml 17.21 ml/m  RA Volume:   30.10 ml  14.85 ml/m LA Vol (A4C): 26.8 ml 13.22 ml/m  AORTIC VALVE LVOT Vmax:   112.00 cm/s LVOT Vmean:  81.200 cm/s LVOT VTI:    0.265 m  AORTA Ao Root diam: 3.60 cm Ao Asc diam:  3.70 cm MITRAL VALVE MV Area (PHT): 3.12 cm    SHUNTS MV Decel Time: 243 msec    Systemic VTI:  0.26 m MV E velocity: 93.30 cm/s  Systemic Diam: 2.00 cm MV A velocity: 79.70 cm/s MV E/A ratio:  1.17 Toribio Fuel MD Electronically signed by Toribio Fuel MD Signature Date/Time: 08/14/2023/3:20:01 PM    Final    Portable chest 1 View Result Date: 08/12/2023 CLINICAL DATA:  Cough.  Preop exam. EXAM: PORTABLE CHEST 1 VIEW COMPARISON:  Radiograph 12/11/2021, CT 12/22/2021 FINDINGS: The cardiomediastinal contours are normal. Scarring on prior CT is not well seen by radiograph. Pulmonary vasculature is normal. No consolidation, pleural effusion, or pneumothorax. No acute osseous abnormalities are seen. IMPRESSION: No active disease. Electronically Signed   By: Andrea Gasman M.D.   On: 08/12/2023 20:20   DG Knee Left Port Result Date: 08/12/2023 CLINICAL DATA:  Left knee pain. EXAM:  PORTABLE LEFT KNEE - 1-2 VIEW COMPARISON:  06/27/2023, MRI 04/29/2023 FINDINGS: Decreased patellar [place] from prior exam. Normal tibiofemoral alignment. 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 Moderate joint effusion, although diminished from prior. Anterior soft tissue edema  persists. No evidence of acute fracture or erosive change. IMPRESSION: 1. Decreased patellar [place] from prior exam. 2. 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 3. Moderate joint effusion, although diminished from prior. Electronically Signed   By: Andrea Gasman M.D.   On: 08/12/2023 20:19    Disposition:  Discharge disposition: 06-Home-Health Care Svc       Discharge Instructions     Ambulatory referral to Physical Therapy   Complete by: As directed    Call MD / Call 911   Complete by: As directed    If you experience chest pain or shortness of breath, CALL 911 and be transported to the hospital emergency room.  If you develope a fever above 101 F, pus (white drainage) or increased drainage or redness at the wound, or calf pain, call your surgeon's office.   Diet - low sodium heart healthy   Complete by: As directed    Discharge instructions   Complete by: As directed    YOUR POST-OP MEDICINES WERE ALREADY SENT TO THE [facility] IN [place]   Do not put a pillow under the knee. Place it under the heel.   Complete by: As directed    Post-operative opioid taper instructions:   Complete by: As directed    POST-OPERATIVE OPIOID TAPER INSTRUCTIONS: It is important to wean off of your opioid medication as soon as possible. If you do not need pain medication after your surgery it is ok to stop day one. Opioids include: Codeine , Hydrocodone (Norco, Vicodin), Oxycodone (Percocet, oxycontin ) and hydromorphone  amongst others.  Long term and even short term use of  opiods can cause: Increased pain response Dependence Constipation Depression Respiratory depression And more.  Withdrawal symptoms can include Flu like symptoms Nausea, vomiting And more Techniques to manage these symptoms Hydrate well Eat regular healthy meals Stay active Use relaxation techniques(deep breathing, meditating, yoga) Do Not substitute Alcohol to help with tapering If you have been on opioids for less than two weeks and do not have pain than it is ok to stop all together.  Plan to wean off of opioids This plan should start within one week post op of your joint replacement. Maintain the same interval or time between taking each dose and first decrease the dose.  Cut the total daily intake of opioids by one tablet each day Next start to increase the time between doses. The last dose that should be eliminated is the evening dose.      Weight bearing as tolerated   Complete by: As directed    While in knee brace   Laterality: left   Extremity: Lower        Follow-up Information     Beverley Evalene BIRCH, MD. Schedule an appointment as soon as possible for a visit in 5 day(s).   Specialty: Orthopedic Surgery Contact information: [address] [place] [address] [phone]                  Signed: Gerard CHRISTELLA Ted DEVONNA Office [phone] 09/05/2023, 1:32 PM

## 2023-09-05 NOTE — Discharge Instructions (Signed)
 Keep leg elevated with ice to reduce pain and swelling.  Diet: As you were doing prior to hospitalization   Dressing:  Leave dressings in place until office follow up. Reinforce with new dressings if needed. Use a plastic bag to cover the leg while showering to keep the dressing dry. No submerging incisions in water in a bath/pool/ocean. Continue to use ace wrap for compression to reduce swelling. A large amount of fluid is pumped through your knee during surgery to help with visualization. It is normal to have swelling and bruising due to this. Some of the fluid may leak out of the incisions and onto the dressings.    Activity:  Increase activity slowly as tolerated, but follow the weight bearing instructions below.  The rules on driving is that you can not be taking narcotics while you drive, and you must feel in control of the vehicle.    Weight Bearing: As tolerated when in the Bledsoe knee brace  Medicines:  - Tylenol  is a pain medicine for mild to moderate pain. Try taking this before resorting to your narcotic pain medicine. - Oxycodone  is a narcotic pain medicine for severe pain. Take this as little as possible and stop as soon as possible. - Methocarbamol  is for muscle spasms. This medicine can cause drowsiness. - Diazepam  is for post-op anxiety and restlessness. Do not take this at the same exact moment you take a narcotic or muscle relaxer.  - Zofran  is for nausea and vomiting.  - Eliquis  is to treat your post-op DVT and prevent any more from forming.  To prevent constipation: you may use a stool softener such as -  Colace (over the counter) 100 mg by mouth twice a day  Drink plenty of fluids (prune juice may be helpful) and high fiber foods Miralax  (over the counter) for constipation as needed.    Itching:  If you experience itching with your medications, try taking only a single pain pill, or even half a pain pill at a time.  You can also use benadryl  over the counter for itching  or also to help with sleep.   Precautions:  If you experience chest pain or shortness of breath - call 911 immediately for transfer to the hospital emergency department!!  If you develop a fever greater that 101 F, purulent drainage from wound, increased redness or drainage from wound, or calf pain -- Call the office at [phone]                                                 Follow- Up Appointment:  Please call for an appointment to be seen in 2 weeks by Dr. Beverley- [phone]

## 2023-09-05 NOTE — TOC Transition Note (Addendum)
 Transition of Care [facility]) - Discharge Note   Patient Details  Name: Paul [name]. MRN: [id number] Date of Birth: 1945/06/30  Transition of [facility]) CM/SW Contact:  Alfonse JONELLE Rex, RN Phone Number: 09/05/2023, 1:48 PM   Clinical Narrative:  DC to home. [facility] for home iv abx, HH RN through [employer], OPPT at [facility]. No further TOC needs identified at this time.      Final next level of care: Home w Home Health Services Barriers to Discharge: Barriers Resolved   Patient Goals and CMS Choice Patient states their goals for this hospitalization and ongoing recovery are:: return home          Discharge Placement                       Discharge Plan and Services Additional resources added to the After Visit Summary for                                       Social Drivers of Health (SDOH) Interventions SDOH Screenings   Food Insecurity: No Food Insecurity (08/19/2023)  Housing: Low Risk  (08/19/2023)  Transportation Needs: No Transportation Needs (08/19/2023)  Utilities: Not At Risk (08/19/2023)  Depression (PHQ2-9): Low Risk  (04/16/2023)  Social Connections: Moderately Isolated (08/19/2023)  Tobacco Use: Medium Risk (09/04/2023)     Readmission Risk Interventions    09/05/2023    1:45 PM 08/20/2023   11:26 AM 05/01/2023    1:40 PM  Readmission Risk Prevention Plan  Transportation Screening Complete Complete Complete  PCP or Specialist Appt within 5-7 Days   Complete  PCP or Specialist Appt within 3-5 Days Complete Complete   Home Care Screening   Complete  Medication Review (RN CM)   Complete  HRI or Home Care Consult Complete Complete   Social Work Consult for Recovery Care Planning/Counseling Complete Complete   Palliative Care Screening Not Applicable Not Applicable   Medication Review Oceanographer) Complete Complete

## 2023-09-05 NOTE — Progress Notes (Signed)
 PHYSICAL THERAPY  Pt has orders for Discharge today and requests to rest to prep.  Per D/C summary, Pt is WBAT with brace. Addressed all mobility questions.  NO PT session needed today.  Katheryn Leap  PTA Acute  Rehabilitation Services Office M-F          [phone]

## 2023-09-05 NOTE — Progress Notes (Signed)
 Pt to be discharged today. Heparin  locked PICC and changed dressing. Educated pt that dressing should be changed in 7 days or sooner if dressing falls off or becomes soiled. Educated pt on how to change dressing and pt verbalized understanding. No family or friends at bedside to educate how to change dressing. Pt states he should have a follow up appt next week in office and will inquire about who can change it then.

## 2023-09-06 ENCOUNTER — Telehealth: Payer: Self-pay | Admitting: *Deleted

## 2023-09-06 DIAGNOSIS — M009 Pyogenic arthritis, unspecified: Secondary | ICD-10-CM | POA: Diagnosis not present

## 2023-09-06 DIAGNOSIS — A499 Bacterial infection, unspecified: Secondary | ICD-10-CM | POA: Diagnosis not present

## 2023-09-06 NOTE — Transitions of Care (Post Inpatient/ED Visit) (Signed)
 09/06/2023  Name: Paul [name]. MRN: [id number] DOB: 06-Apr-1945  Today's TOC FU Call Status: Today's TOC FU Call Status:: Successful TOC FU Call Completed TOC FU Call Complete Date: 09/06/23 Patient's Name and Date of Birth confirmed.  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  This all started back in February, and recently- these are all planned surgeries by the orthopedic team.  I am an old hat at managing all of this, it's been going on for so long.) Any questions or concerns?: No  Items Reviewed: Did you receive and understand the discharge instructions provided?: Yes (thoroughly reviewed with patient who verbalizes good understanding of same) Medications obtained,verified, and reconciled?: Partial Review Completed Reason for Partial Mediation Review: Patient declined: reports he has gone over the medicines with a fine toothed- comb, he confirms he has obtained and continues taking the IV antibiotics as prescribed, and he denies questions/ concerns around current medications; confirms he self- manages all medications: including IV antibiotics Any new allergies since your discharge?: No Dietary orders reviewed?: Yes Type of Diet Ordered:: Healthy as I can Do you have support at home?: Yes People in Home [RPT]: spouse Name of Support/Comfort Primary Source: Reports independent in self-care activities; supportive spouse assists as/ if needed/ indicated  Medications Reviewed Today: Medications Reviewed Today     Reviewed by Syrena Burges M, RN (Registered Nurse) on 09/06/23 at 1530  Med List  Status: <None>   Medication Order Taking? Sig Documenting Provider Last Dose Status Informant  atorvastatin  (LIPITOR) 10 MG tablet [id number]  Take 10 mg by mouth daily. [provider]  Active Self, Pharmacy Records  budesonide -formoterol  (SYMBICORT ) 80-4.5 MCG/ACT inhaler [id number]  Inhale 2 puffs into the lungs 2 (two) times daily. Malachy Comer GAILS, NP  Active Self, Pharmacy Records  buPROPion  (WELLBUTRIN  XL) 150 MG 24 hr tablet [id number]  Take 1 tablet by mouth daily. [name], Aleksei V, MD  Active Self, Pharmacy Records  ceFAZolin  (ANCEF ) IVPB [id number] Yes Inject 2 g into the vein every 8 (eight) hours for 24 days. Indication:  MSSA bacteremia and septic arthritis First Dose: Yes Last Day of Therapy:  09/26/23 Labs - Once weekly:  CBC/D and BMP, Labs - Once weekly: ESR and CRP Method of administration: IV Push Method of administration may be changed at the discretion of home infusion pharmacist based upon assessment of the patient and/or caregiver's ability to self-administer the medication ordered. Dennise Kingsley, MD  Active   chlorhexidine  (HIBICLENS ) 4 % external liquid [id number]  Apply 15 mLs (1 Application total) topically as directed for 30 doses. Use as directed daily for 5 days every other week for 6 weeks. Gawne, Meghan M, PA-C  Active Self, Pharmacy Records  cholecalciferol  (VITAMIN D3) 25 MCG (1000 UNIT) tablet [id number]  Take 1,000 Units by mouth daily. [provider]  Active Self, Pharmacy Records  finasteride  (PROSCAR ) 5 MG tablet [id number]  Take 1 tablet by mouth daily. [name], Aleksei V, MD  Active Self, Pharmacy Records  furosemide  (LASIX ) 20 MG tablet [id number]  TAKE 1 TO 2 TABLETS BY MOUTH DAILY AS NEEDED  Patient taking differently: Take 20-40 mg by mouth daily as needed for fluid.  [name], Aleksei V, MD  Active Self, Pharmacy Records  magnesium  oxide (MAG-OX) 400 (240 Mg) MG tablet [id number]  Take 1 tablet (400 mg total) by mouth daily. Maurice Sharlet RAMAN, PA-C  Active Self, Pharmacy Records  metoprolol  tartrate (LOPRESSOR ) 25 MG tablet [id number]  Take 1 tablet (25 mg total) by mouth 2 (two) times daily. Odell Celinda Balo, MD  Active Self, Pharmacy Records  Multiple Vitamin (MULTIVITAMIN ADULT PO) [id number]  Take 3 capsules by mouth in the morning. [provider]  Active Self, Pharmacy Records  mupirocin  ointment (BACTROBAN ) 2 % [id number]  Place 1 Application into the nose 2 (two) times daily for 60 doses. Use as directed 2 times daily for 5 days every other week for 6 weeks. Gawne, Meghan M, PA-C  Active Self, Pharmacy Records  pantoprazole  (PROTONIX ) 40 MG tablet [id number]  Take 1 tablet by mouth daily. Annual appointment due in October, must see provider for future refills.  Patient taking differently: Take 40 mg by mouth daily.   [name], Aleksei V, MD  Active Self, Pharmacy Records  polyethylene glycol (MIRALAX  / GLYCOLAX ) 17 g packet [id number]  Take 17 g by mouth 2 (two) times daily. Pegge Toribio PARAS, PA-C  Active Self, Pharmacy Records  triamterene -hydrochlorothiazide  (MAXZIDE -25) 37.5-25 MG tablet [id number]  Take 1 tablet by mouth daily. [name], Aleksei V, MD  Active Self, Pharmacy Records  valsartan  (DIOVAN ) 160 MG tablet [id number]  TAKE 1 TABLET(160 MG) BY MOUTH DAILY [name], Aleksei V, MD  Active Self, Pharmacy Records  zolpidem  (AMBIEN ) 10 MG tablet [id number]  TAKE 1 TABLET BY MOUTH AT BEDTIME AS NEEDED  Patient taking differently: Take 10 mg by mouth at bedtime as needed for sleep. TAKE 1 TABLET BY MOUTH AT BEDTIME AS NEEDED   [name], Aleksei V, MD  Active Self, Pharmacy Records           Home Care and Equipment/Supplies: Were Home Health Services Ordered?: Yes Name of Home Health Agency:: [facility]: confirmed has been active with agency x several months confirmed RN making weekly visits and that patient has contact information for IV agency: we know each other well, they have been  coming out for weeks now Has Agency set up a time to come to your home?: Yes First Home Health Visit Date: 09/06/23 Any new equipment or medical supplies ordered?: No  Functional Questionnaire: Do you need assistance with bathing/showering or dressing?: No Do you need assistance with meal preparation?: No Do you need assistance with eating?: No Do you have difficulty maintaining continence: No Do you need assistance with getting out of bed/getting out of a chair/moving?: No Do you have difficulty managing or taking your medications?: No  Follow up appointments reviewed: PCP Follow-up appointment confirmed?: Yes Date of PCP follow-up appointment?: 09/16/23 Follow-up Provider: PCP- Dr. Garald Specialist [facility] Follow-up appointment confirmed?: Yes Date of Specialist follow-up appointment?: 09/11/23 Follow-Up Specialty Provider:: orthopedic provider and ID provider Do you need transportation to your follow-up appointment?: No Do you understand care options if your condition(s) worsen?: Yes-patient verbalized understanding  SDOH Interventions Today    Flowsheet Row Most Recent Value  SDOH Interventions   Food Insecurity Interventions Intervention Not Indicated  Housing Interventions Intervention Not Indicated  Transportation Interventions Intervention Not Indicated  [drives self,  spouse assists as indicated]  Utilities Interventions Intervention Not Indicated   Patient declines need for ongoing/ further care management outreach; declines enrollment in 30-day [facility] program; provided my direct contact information should questions/ concerns/ needs arise post-TOC  call   See TOC assessment tabs for additional assessment/ TOC intervention information  Pls call/ message for questions,  Joshwa Hemric Mckinney Demarqus Jocson, RN, BSN, CCRN Alumnus RN Care Manager  Transitions of Care  [facility] [phone]: direct office

## 2023-09-07 DIAGNOSIS — M009 Pyogenic arthritis, unspecified: Secondary | ICD-10-CM | POA: Diagnosis not present

## 2023-09-07 DIAGNOSIS — A499 Bacterial infection, unspecified: Secondary | ICD-10-CM | POA: Diagnosis not present

## 2023-09-08 DIAGNOSIS — A499 Bacterial infection, unspecified: Secondary | ICD-10-CM | POA: Diagnosis not present

## 2023-09-08 DIAGNOSIS — M009 Pyogenic arthritis, unspecified: Secondary | ICD-10-CM | POA: Diagnosis not present

## 2023-09-09 DIAGNOSIS — A499 Bacterial infection, unspecified: Secondary | ICD-10-CM | POA: Diagnosis not present

## 2023-09-09 DIAGNOSIS — M009 Pyogenic arthritis, unspecified: Secondary | ICD-10-CM | POA: Diagnosis not present

## 2023-09-09 LAB — AEROBIC/ANAEROBIC CULTURE W GRAM STAIN (SURGICAL/DEEP WOUND)
Culture: NO GROWTH
Culture: NO GROWTH
Gram Stain: NONE SEEN

## 2023-09-10 DIAGNOSIS — A499 Bacterial infection, unspecified: Secondary | ICD-10-CM | POA: Diagnosis not present

## 2023-09-10 DIAGNOSIS — M009 Pyogenic arthritis, unspecified: Secondary | ICD-10-CM | POA: Diagnosis not present
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
## 2023-09-11 ENCOUNTER — Encounter: Payer: Self-pay | Admitting: Family

## 2023-09-11 ENCOUNTER — Telehealth: Payer: Self-pay

## 2023-09-11 ENCOUNTER — Other Ambulatory Visit: Payer: Self-pay

## 2023-09-11 ENCOUNTER — Ambulatory Visit: Payer: Self-pay | Admitting: Family

## 2023-09-11 VITALS — BP 106/63 | HR 76 | Temp 98.0°F | Ht 72.0 in | Wt 169.4 lb

## 2023-09-11 DIAGNOSIS — R7881 Bacteremia: Secondary | ICD-10-CM

## 2023-09-11 DIAGNOSIS — M00062 Staphylococcal arthritis, left knee: Secondary | ICD-10-CM

## 2023-09-11 DIAGNOSIS — A499 Bacterial infection, unspecified: Secondary | ICD-10-CM | POA: Diagnosis not present

## 2023-09-11 DIAGNOSIS — Z95828 Presence of other vascular implants and grafts: Secondary | ICD-10-CM | POA: Insufficient documentation

## 2023-09-11 DIAGNOSIS — M009 Pyogenic arthritis, unspecified: Secondary | ICD-10-CM | POA: Diagnosis not present

## 2023-09-11 DIAGNOSIS — B9561 Methicillin susceptible Staphylococcus aureus infection as the cause of diseases classified elsewhere: Secondary | ICD-10-CM

## 2023-09-11 DIAGNOSIS — S76102D Unspecified injury of left quadriceps muscle, fascia and tendon, subsequent encounter: Secondary | ICD-10-CM | POA: Diagnosis not present

## 2023-09-11 NOTE — Assessment & Plan Note (Signed)
 PICC line functioning appropriately with no evidence of complication. Continue PICC line care per protocol.

## 2023-09-11 NOTE — Telephone Encounter (Signed)
 Per Cathlyn July, NP this patient end date to stop Iv abx and pull picc is on 7/17. Sent a message to amerita about orders as well.

## 2023-09-11 NOTE — Patient Instructions (Addendum)
 Nice to see you.  Continue to take your antibiotics as planned through 09/26/23.  Recommend ice x 20 minutes every 2 hours as needed - consider cryocuff if needed.   Plan for follow up in 3 weeks or sooner if needed and can be a virtual visit if needed.   Have a great day and stay safe!

## 2023-09-11 NOTE — Assessment & Plan Note (Signed)
 Paul [name] continues to receive Cefazolin  with no adverse side effects for treatment of left knee septic arthritis complicated by MSSA bacteremia. Continues to have pain that is worse with activity but appears infection continues to clear with adequate healing of surgical incision. Discussed plan of care to continue with current dose of Cefazolin  as planned through 09/26/23. Continue wound care per Orthopedics. Recommend ice as needed for pain. Will arrange follow up in 3 weeks or sooner if needed.

## 2023-09-12 DIAGNOSIS — M009 Pyogenic arthritis, unspecified: Secondary | ICD-10-CM | POA: Diagnosis not present

## 2023-09-12 DIAGNOSIS — A499 Bacterial infection, unspecified: Secondary | ICD-10-CM | POA: Diagnosis not present

## 2023-09-13 DIAGNOSIS — M009 Pyogenic arthritis, unspecified: Secondary | ICD-10-CM | POA: Diagnosis not present

## 2023-09-13 DIAGNOSIS — A499 Bacterial infection, unspecified: Secondary | ICD-10-CM | POA: Diagnosis not present

## 2023-09-14 DIAGNOSIS — A499 Bacterial infection, unspecified: Secondary | ICD-10-CM | POA: Diagnosis not present

## 2023-09-14 DIAGNOSIS — M009 Pyogenic arthritis, unspecified: Secondary | ICD-10-CM | POA: Diagnosis not present

## 2023-09-15 DIAGNOSIS — A499 Bacterial infection, unspecified: Secondary | ICD-10-CM | POA: Diagnosis not present

## 2023-09-15 DIAGNOSIS — M009 Pyogenic arthritis, unspecified: Secondary | ICD-10-CM | POA: Diagnosis not present

## 2023-09-16 ENCOUNTER — Ambulatory Visit: Admitting: Internal Medicine

## 2023-09-16 ENCOUNTER — Encounter: Payer: Self-pay | Admitting: Internal Medicine

## 2023-09-16 VITALS — BP 110/62 | HR 114 | Temp 98.5°F | Ht 72.0 in

## 2023-09-16 DIAGNOSIS — B370 Candidal stomatitis: Secondary | ICD-10-CM | POA: Insufficient documentation

## 2023-09-16 DIAGNOSIS — M009 Pyogenic arthritis, unspecified: Secondary | ICD-10-CM | POA: Diagnosis not present

## 2023-09-16 DIAGNOSIS — F419 Anxiety disorder, unspecified: Secondary | ICD-10-CM

## 2023-09-16 DIAGNOSIS — F32A Depression, unspecified: Secondary | ICD-10-CM | POA: Diagnosis not present

## 2023-09-16 DIAGNOSIS — I2583 Coronary atherosclerosis due to lipid rich plaque: Secondary | ICD-10-CM | POA: Diagnosis not present

## 2023-09-16 DIAGNOSIS — A499 Bacterial infection, unspecified: Secondary | ICD-10-CM | POA: Diagnosis not present

## 2023-09-16 DIAGNOSIS — E46 Unspecified protein-calorie malnutrition: Secondary | ICD-10-CM | POA: Insufficient documentation

## 2023-09-16 MED ORDER — FLUCONAZOLE 100 MG PO TABS: ORAL_TABLET | ORAL | 1 refills | Status: DC

## 2023-09-16 MED ORDER — OXYCODONE HCL 10 MG PO TABS: 10.0000 mg | ORAL_TABLET | Freq: Three times a day (TID) | ORAL | 0 refills | Status: DC | PRN

## 2023-09-16 NOTE — Assessment & Plan Note (Signed)
 Situational depression/ anxiety to to a lot of medical and surgical illness lately - worse On Wellbutrin  XL

## 2023-09-16 NOTE — Progress Notes (Signed)
 Subjective:  Patient ID: Paul [name]., male    DOB: [birth year]  Age: 78 y.o. MRN: [id number]  CC: Medical Management of Chronic Issues (2 mnth f/u )   HPI Paul [name]. presents for L knee pain, weak voice, infected knee. L knee pain 7/10 - on Oxy 5 mg 3-4 a day..  Per recent Paul [name] note:  Paul [name]. is a 78 y.o. male recently admitted to the hospital with a history of bilateral quadriceps tendon repair in 2025 complicated by recurrent left leg injury resulting in increasing swelling and pain to the left knee. Synovial fluid aspiration showed 79,000 cell with 92% neutrophils and culture growing gram positive cocci in pairs. Found to have MSSA bacteremia in the setting of septic arthritis of the left knee with abscess. Underwent debridement of the left knee on 08/13/23 and repeat debridements on 08/19/23 and 08/29/23. TEE without evidence of endocarditis. Last seen by Dr. Dennise on 09/02/23 with plan for 4 weeks of Cefazolin  from his last OR date on 08/29/23 with end date of 09/26/23. Course was complicated by development of DVT and was placed on anticoagulation. Recently discharged on 09/05/23. Most recent lab work on 08/31/23 with CRP 6.5 and Sed rate 111. Renal function has remained stable with creatinine 0.87 and eGFR >60. Here today for hospitalization follow up.    [name] has been doing okay since leaving the hospital. Seen by Orthopedics this morning with sutures removed and remains in brace. Having left knee pain that is worse at night after moving around during the day. Elevates his leg as able during the day as he works from home in [employer]. Is able to tolerate completion of activities of daily living and ambulating with a walker. Pain at night occurs in thigh as well and severity is dependent upon his activity level during the day. Having some issues with being deconditioned and finds he tires easily. Tolerating antibiotics with no adverse side effects. PICC line  functioning without any issues. Denies fevers or chills.     Outpatient Medications Prior to Visit  Medication Sig Dispense Refill   atorvastatin  (LIPITOR) 10 MG tablet Take 10 mg by mouth daily.     budesonide -formoterol  (SYMBICORT ) 80-4.5 MCG/ACT inhaler Inhale 2 puffs into the lungs 2 (two) times daily. 1 each 12   buPROPion  (WELLBUTRIN  XL) 150 MG 24 hr tablet Take 1 tablet by mouth daily. 90 tablet 1   ceFAZolin  (ANCEF ) IVPB Inject 2 g into the vein every 8 (eight) hours for 24 days. Indication:  MSSA bacteremia and septic arthritis First Dose: Yes Last Day of Therapy:  09/26/23 Labs - Once weekly:  CBC/D and BMP, Labs - Once weekly: ESR and CRP Method of administration: IV Push Method of administration may be changed at the discretion of home infusion pharmacist based upon assessment of the patient and/or caregiver's ability to self-administer the medication ordered. 81 Units 0   celecoxib (CELEBREX) 200 MG capsule Take 200 mg by mouth 2 (two) times daily as needed.     cholecalciferol  (VITAMIN D3) 25 MCG (1000 UNIT) tablet Take 1,000 Units by mouth daily.     diazepam  (VALIUM ) 5 MG tablet Take by mouth.     ELIQUIS  5 MG TABS tablet Take 5 mg by mouth 2 (two) times daily.     finasteride  (PROSCAR ) 5 MG tablet Take 1 tablet by mouth daily. 90 tablet 3   furosemide  (LASIX ) 20 MG tablet TAKE 1 TO 2 TABLETS BY  MOUTH DAILY AS NEEDED (Patient taking differently: Take 20-40 mg by mouth daily as needed for fluid.) 180 tablet 1   magnesium  oxide (MAG-OX) 400 (240 Mg) MG tablet Take 1 tablet (400 mg total) by mouth daily. 30 tablet 0   methocarbamol  (ROBAXIN ) 500 MG tablet Take 500 mg by mouth every 8 (eight) hours as needed.     metoprolol  tartrate (LOPRESSOR ) 25 MG tablet Take 1 tablet (25 mg total) by mouth 2 (two) times daily.     Multiple Vitamin (MULTIVITAMIN ADULT PO) Take 3 capsules by mouth in the morning.     ondansetron  (ZOFRAN -ODT) 4 MG disintegrating tablet SMARTSIG:1 Tablet(s) By  Mouth 1-3 Times Daily PRN     pantoprazole  (PROTONIX ) 40 MG tablet Take 1 tablet by mouth daily. Annual appointment due in October, must see provider for future refills. (Patient taking differently: Take 40 mg by mouth daily.) 90 tablet 3   polyethylene glycol (MIRALAX  / GLYCOLAX ) 17 g packet Take 17 g by mouth 2 (two) times daily.     predniSONE  (DELTASONE ) 10 MG tablet Take by mouth.     triamterene -hydrochlorothiazide  (MAXZIDE -25) 37.5-25 MG tablet Take 1 tablet by mouth daily. 90 tablet 3   valsartan  (DIOVAN ) 160 MG tablet TAKE 1 TABLET(160 MG) BY MOUTH DAILY 90 tablet 3   zolpidem  (AMBIEN ) 10 MG tablet TAKE 1 TABLET BY MOUTH AT BEDTIME AS NEEDED (Patient taking differently: Take 10 mg by mouth at bedtime as needed for sleep. TAKE 1 TABLET BY MOUTH AT BEDTIME AS NEEDED) 90 tablet 1   Oxycodone  HCl 10 MG TABS Take 10 mg by mouth every 4 (four) hours as needed.     chlorhexidine  (HIBICLENS ) 4 % external liquid Apply 15 mLs (1 Application total) topically as directed for 30 doses. Use as directed daily for 5 days every other week for 6 weeks. (Patient not taking: Reported on 09/16/2023) 946 mL 1   No facility-administered medications prior to visit.    ROS: Review of Systems  Constitutional:  Positive for fatigue. Negative for appetite change and unexpected weight change.  HENT:  Positive for mouth sores, sore throat and voice change. Negative for congestion, nosebleeds, sneezing and trouble swallowing.   Eyes:  Negative for itching and visual disturbance.  Respiratory:  Negative for cough.   Cardiovascular:  Negative for chest pain, palpitations and leg swelling.  Gastrointestinal:  Negative for abdominal distention, blood in stool, diarrhea and nausea.  Genitourinary:  Negative for frequency and hematuria.  Musculoskeletal:  Positive for arthralgias and gait problem. Negative for back pain, joint swelling and neck pain.  Skin:  Positive for color change. Negative for rash.  Neurological:   Positive for weakness. Negative for dizziness, tremors and speech difficulty.  Psychiatric/Behavioral:  Negative for agitation, dysphoric mood, sleep disturbance and suicidal ideas. The patient is not nervous/anxious.     Objective:  BP 110/62   Pulse (!) 114   Temp 98.5 F (36.9 C) (Oral)   Ht 6' (1.829 m)   SpO2 98%   BMI 22.97 kg/m   BP Readings from Last 3 Encounters:  09/16/23 110/62  09/11/23 106/63  09/05/23 121/64    Wt Readings from Last 3 Encounters:  09/11/23 169 lb 6.4 oz (76.8 kg)  08/28/23 177 lb 14.6 oz (80.7 kg)  08/13/23 178 lb (80.7 kg)    Physical Exam Constitutional:      General: He is not in acute distress.    Appearance: He is well-developed. He is not toxic-appearing.  Comments: NAD  Eyes:     Conjunctiva/sclera: Conjunctivae normal.     Pupils: Pupils are equal, round, and reactive to light.  Neck:     Thyroid : No thyromegaly.     Vascular: No JVD.  Cardiovascular:     Rate and Rhythm: Normal rate and regular rhythm.     Heart sounds: Normal heart sounds. No murmur heard.    No friction rub. No gallop.  Pulmonary:     Effort: Pulmonary effort is normal. No respiratory distress.     Breath sounds: Normal breath sounds. No wheezing or rales.  Chest:     Chest wall: No tenderness.  Abdominal:     General: Bowel sounds are normal. There is no distension.     Palpations: Abdomen is soft. There is no mass.     Tenderness: There is no abdominal tenderness. There is no guarding or rebound.  Musculoskeletal:        General: Tenderness present. Normal range of motion.     Cervical back: Normal range of motion.     Right lower leg: No edema.     Left lower leg: No edema.  Lymphadenopathy:     Cervical: No cervical adenopathy.  Skin:    General: Skin is warm and dry.     Findings: No rash.  Neurological:     Mental Status: He is alert and oriented to person, place, and time.     Cranial Nerves: No cranial nerve deficit.     Motor: No  abnormal muscle tone.     Coordination: Coordination abnormal.     Gait: Gait abnormal.     Deep Tendon Reflexes: Reflexes are normal and symmetric.  Psychiatric:        Behavior: Behavior normal.        Thought Content: Thought content normal.        Judgment: Judgment normal.   L knee is in a brace - swollen Throat thrush Appears chronically ill  Lab Results  Component Value Date   WBC 18.4 (H) 09/05/2023   HGB 11.0 (L) 09/05/2023   HCT 34.4 (L) 09/05/2023   PLT 744 (H) 09/05/2023   GLUCOSE 113 (H) 09/05/2023   CHOL 190 05/31/2022   TRIG 144.0 05/31/2022   HDL 57.80 05/31/2022   LDLDIRECT 96.0 05/30/2020   LDLCALC 103 (H) 05/31/2022   ALT 19 09/05/2023   AST 30 09/05/2023   NA 134 (L) 09/05/2023   K 3.7 09/05/2023   CL 102 09/05/2023   CREATININE 0.87 09/05/2023   BUN 30 (H) 09/05/2023   CO2 23 09/05/2023   TSH 3.680 05/28/2023   PSA 0.16 05/31/2022   INR 1.0 08/12/2023    No results found.  Assessment & Plan:   Problem List Items Addressed This Visit     Anxiety disorder   Situational depression/ anxiety to to a lot of medical and surgical illness lately - worse On Wellbutrin  XL      Relevant Medications   diazepam  (VALIUM ) 5 MG tablet   Depression - Primary   Situational depression to to a lot of medical and surgical illness lately - worse On Wellbutrin  XL      Relevant Medications   diazepam  (VALIUM ) 5 MG tablet   Coronary atherosclerosis   On Lipitor      Relevant Medications   ELIQUIS  5 MG TABS tablet   Septic arthritis (HCC)   L knee - on IV abx Ancef  Oxy prn  Potential benefits of a short/long term  opioids use as well as potential risks (i.e. addiction risk, apnea etc) and complications (i.e. Somnolence, constipation and others) were explained to the patient and were aknowledged.       Relevant Medications   celecoxib (CELEBREX) 200 MG capsule   methocarbamol  (ROBAXIN ) 500 MG tablet   predniSONE  (DELTASONE ) 10 MG tablet   Oxycodone   HCl 10 MG TABS   fluconazole  (DIFLUCAN ) 100 MG tablet   Other Relevant Orders   CBC with Differential/Platelet   Comprehensive metabolic panel with GFR   TSH   Urinalysis   Malnutrition related to chronic disease (HCC)   Protein-rich  meals       Relevant Orders   CBC with Differential/Platelet   Oral candidiasis   Diflucan  po x 10 days      Relevant Medications   fluconazole  (DIFLUCAN ) 100 MG tablet      Meds ordered this encounter  Medications   Oxycodone  HCl 10 MG TABS    Sig: Take 1 tablet (10 mg total) by mouth 3 (three) times daily as needed.    Dispense:  90 tablet    Refill:  0   fluconazole  (DIFLUCAN ) 100 MG tablet    Sig: Take 2 tabs on day#1, then 1 tab daily on Days #2-10    Dispense:  11 tablet    Refill:  1      Follow-up: Return in about 4 weeks (around 10/14/2023) for a follow-up visit.  Marolyn Noel, MD

## 2023-09-16 NOTE — Assessment & Plan Note (Signed)
 Protein-rich  meals

## 2023-09-16 NOTE — Assessment & Plan Note (Addendum)
 Diflucan  po x 10 days

## 2023-09-16 NOTE — Assessment & Plan Note (Addendum)
 L knee - on IV abx Ancef  Oxy prn  Potential benefits of a short/long term opioids use as well as potential risks (i.e. addiction risk, apnea etc) and complications (i.e. Somnolence, constipation and others) were explained to the patient and were aknowledged.

## 2023-09-16 NOTE — Assessment & Plan Note (Signed)
 On Lipitor

## 2023-09-16 NOTE — Assessment & Plan Note (Signed)
 Situational depression to to a lot of medical and surgical illness lately - worse On Wellbutrin  XL

## 2023-09-17 DIAGNOSIS — A499 Bacterial infection, unspecified: Secondary | ICD-10-CM | POA: Diagnosis not present

## 2023-09-17 DIAGNOSIS — M009 Pyogenic arthritis, unspecified: Secondary | ICD-10-CM | POA: Diagnosis not present

## 2023-09-18 ENCOUNTER — Telehealth: Payer: Self-pay | Admitting: Pharmacist

## 2023-09-18 DIAGNOSIS — A499 Bacterial infection, unspecified: Secondary | ICD-10-CM | POA: Diagnosis not present

## 2023-09-18 DIAGNOSIS — M009 Pyogenic arthritis, unspecified: Secondary | ICD-10-CM | POA: Diagnosis not present

## 2023-09-18 DIAGNOSIS — S76102D Unspecified injury of left quadriceps muscle, fascia and tendon, subsequent encounter: Secondary | ICD-10-CM | POA: Diagnosis not present

## 2023-09-18 NOTE — Telephone Encounter (Signed)
 FYI that 09/12/23 WBC up to 20.4. Was elevated consistently during hospitalization. ESR 28 and CRP 1.  Alan Geralds, PharmD, CPP, BCIDP, AAHIVP Clinical Pharmacist Practitioner Infectious Diseases Clinical Pharmacist [facility]

## 2023-09-19 DIAGNOSIS — M009 Pyogenic arthritis, unspecified: Secondary | ICD-10-CM | POA: Diagnosis not present

## 2023-09-19 DIAGNOSIS — A499 Bacterial infection, unspecified: Secondary | ICD-10-CM | POA: Diagnosis not present

## 2023-09-20 DIAGNOSIS — M009 Pyogenic arthritis, unspecified: Secondary | ICD-10-CM | POA: Diagnosis not present

## 2023-09-20 DIAGNOSIS — A499 Bacterial infection, unspecified: Secondary | ICD-10-CM | POA: Diagnosis not present

## 2023-09-21 DIAGNOSIS — M009 Pyogenic arthritis, unspecified: Secondary | ICD-10-CM | POA: Diagnosis not present

## 2023-09-21 DIAGNOSIS — A499 Bacterial infection, unspecified: Secondary | ICD-10-CM | POA: Diagnosis not present

## 2023-09-22 DIAGNOSIS — M009 Pyogenic arthritis, unspecified: Secondary | ICD-10-CM | POA: Diagnosis not present

## 2023-09-22 DIAGNOSIS — A499 Bacterial infection, unspecified: Secondary | ICD-10-CM | POA: Diagnosis not present

## 2023-09-23 ENCOUNTER — Telehealth: Payer: Self-pay

## 2023-09-23 DIAGNOSIS — A499 Bacterial infection, unspecified: Secondary | ICD-10-CM | POA: Diagnosis not present

## 2023-09-23 DIAGNOSIS — M009 Pyogenic arthritis, unspecified: Secondary | ICD-10-CM | POA: Diagnosis not present

## 2023-09-23 NOTE — Telephone Encounter (Signed)
 Okay to pull PICC after last dose per [name], NP. Orders sent to Holley Herring, RN with [employer].   Seher Schlagel, BSN, RN

## 2023-09-23 NOTE — Telephone Encounter (Signed)
 Received the following message from Holley Herring, RN with [employer].   PULL PICC Order Received: Today [name] [name], FNP; Cecilie Pair, LPN; Veva Helga DASEN, CMA; Celestia Lela HERO, CMA; Alaya Iverson D, RN Hey team.   Paul [name] ends his IV ABX therapy on Thursday.  Hospital OPAT order was to leave PICC in until confirmed removal by provider.  I see he has appt with Cathlyn on 7/25.  Can we pull PICC after dose on Thursday? Thanks  Message forwarded to [name], NP to advise.    Maxtyn Nuzum, BSN, RN

## 2023-09-24 DIAGNOSIS — M009 Pyogenic arthritis, unspecified: Secondary | ICD-10-CM | POA: Diagnosis not present

## 2023-09-24 DIAGNOSIS — A499 Bacterial infection, unspecified: Secondary | ICD-10-CM | POA: Diagnosis not present

## 2023-09-25 DIAGNOSIS — A499 Bacterial infection, unspecified: Secondary | ICD-10-CM | POA: Diagnosis not present

## 2023-09-25 DIAGNOSIS — M009 Pyogenic arthritis, unspecified: Secondary | ICD-10-CM | POA: Diagnosis not present

## 2023-09-26 DIAGNOSIS — A499 Bacterial infection, unspecified: Secondary | ICD-10-CM | POA: Diagnosis not present

## 2023-09-26 DIAGNOSIS — M009 Pyogenic arthritis, unspecified: Secondary | ICD-10-CM | POA: Diagnosis not present

## 2023-09-27 ENCOUNTER — Other Ambulatory Visit (HOSPITAL_COMMUNITY): Payer: Self-pay

## 2023-09-27 DIAGNOSIS — A499 Bacterial infection, unspecified: Secondary | ICD-10-CM | POA: Diagnosis not present

## 2023-09-27 DIAGNOSIS — M009 Pyogenic arthritis, unspecified: Secondary | ICD-10-CM | POA: Diagnosis not present

## 2023-10-02 DIAGNOSIS — S76102D Unspecified injury of left quadriceps muscle, fascia and tendon, subsequent encounter: Secondary | ICD-10-CM | POA: Diagnosis not present

## 2023-10-04 ENCOUNTER — Ambulatory Visit: Admitting: Family

## 2023-10-04 DIAGNOSIS — S76121D Laceration of right quadriceps muscle, fascia and tendon, subsequent encounter: Secondary | ICD-10-CM | POA: Diagnosis not present

## 2023-10-04 DIAGNOSIS — S76122D Laceration of left quadriceps muscle, fascia and tendon, subsequent encounter: Secondary | ICD-10-CM | POA: Diagnosis not present

## 2023-10-06 ENCOUNTER — Encounter: Payer: Self-pay | Admitting: Internal Medicine

## 2023-10-08 DIAGNOSIS — S76122D Laceration of left quadriceps muscle, fascia and tendon, subsequent encounter: Secondary | ICD-10-CM | POA: Diagnosis not present

## 2023-10-08 DIAGNOSIS — S76121D Laceration of right quadriceps muscle, fascia and tendon, subsequent encounter: Secondary | ICD-10-CM | POA: Diagnosis not present

## 2023-10-09 ENCOUNTER — Other Ambulatory Visit: Payer: Self-pay | Admitting: Internal Medicine

## 2023-10-09 DIAGNOSIS — R251 Tremor, unspecified: Secondary | ICD-10-CM | POA: Insufficient documentation

## 2023-10-10 DIAGNOSIS — S76122D Laceration of left quadriceps muscle, fascia and tendon, subsequent encounter: Secondary | ICD-10-CM | POA: Diagnosis not present

## 2023-10-10 DIAGNOSIS — S76121D Laceration of right quadriceps muscle, fascia and tendon, subsequent encounter: Secondary | ICD-10-CM | POA: Diagnosis not present

## 2023-10-12 ENCOUNTER — Other Ambulatory Visit: Payer: Self-pay | Admitting: Internal Medicine

## 2023-10-14 ENCOUNTER — Ambulatory Visit (INDEPENDENT_AMBULATORY_CARE_PROVIDER_SITE_OTHER): Admitting: Internal Medicine

## 2023-10-14 ENCOUNTER — Encounter: Payer: Self-pay | Admitting: Internal Medicine

## 2023-10-14 VITALS — BP 129/83 | HR 81 | Temp 97.9°F | Ht 72.0 in | Wt 169.0 lb

## 2023-10-14 DIAGNOSIS — I480 Paroxysmal atrial fibrillation: Secondary | ICD-10-CM

## 2023-10-14 DIAGNOSIS — M009 Pyogenic arthritis, unspecified: Secondary | ICD-10-CM | POA: Diagnosis not present

## 2023-10-14 DIAGNOSIS — E8809 Other disorders of plasma-protein metabolism, not elsewhere classified: Secondary | ICD-10-CM | POA: Insufficient documentation

## 2023-10-14 DIAGNOSIS — S76119D Strain of unspecified quadriceps muscle, fascia and tendon, subsequent encounter: Secondary | ICD-10-CM | POA: Diagnosis not present

## 2023-10-14 DIAGNOSIS — E46 Unspecified protein-calorie malnutrition: Secondary | ICD-10-CM | POA: Diagnosis not present

## 2023-10-14 LAB — CBC WITH DIFFERENTIAL/PLATELET
Basophils Absolute: 0 K/uL (ref 0.0–0.1)
Basophils Relative: 0.7 % (ref 0.0–3.0)
Eosinophils Absolute: 0.1 K/uL (ref 0.0–0.7)
Eosinophils Relative: 1.1 % (ref 0.0–5.0)
HCT: 39.5 % (ref 39.0–52.0)
Hemoglobin: 13.2 g/dL (ref 13.0–17.0)
Lymphocytes Relative: 19 % (ref 12.0–46.0)
Lymphs Abs: 1.3 K/uL (ref 0.7–4.0)
MCHC: 33.4 g/dL (ref 30.0–36.0)
MCV: 91 fl (ref 78.0–100.0)
Monocytes Absolute: 1 K/uL (ref 0.1–1.0)
Monocytes Relative: 14.5 % — ABNORMAL HIGH (ref 3.0–12.0)
Neutro Abs: 4.5 K/uL (ref 1.4–7.7)
Neutrophils Relative %: 64.7 % (ref 43.0–77.0)
Platelets: 401 K/uL — ABNORMAL HIGH (ref 150.0–400.0)
RBC: 4.34 Mil/uL (ref 4.22–5.81)
RDW: 14.6 % (ref 11.5–15.5)
WBC: 6.9 K/uL (ref 4.0–10.5)

## 2023-10-14 LAB — COMPREHENSIVE METABOLIC PANEL WITH GFR
ALT: 10 U/L (ref 0–53)
AST: 27 U/L (ref 0–37)
Albumin: 4 g/dL (ref 3.5–5.2)
Alkaline Phosphatase: 54 U/L (ref 39–117)
BUN: 25 mg/dL — ABNORMAL HIGH (ref 6–23)
CO2: 30 meq/L (ref 19–32)
Calcium: 9.4 mg/dL (ref 8.4–10.5)
Chloride: 101 meq/L (ref 96–112)
Creatinine, Ser: 0.76 mg/dL (ref 0.40–1.50)
GFR: 85.67 mL/min (ref >60.00)
Glucose, Bld: 103 mg/dL — ABNORMAL HIGH (ref 70–99)
Potassium: 4.4 meq/L (ref 3.5–5.1)
Sodium: 138 meq/L (ref 135–145)
Total Bilirubin: 0.4 mg/dL (ref 0.2–1.2)
Total Protein: 6.7 g/dL (ref 6.0–8.3)

## 2023-10-14 LAB — URINALYSIS
Bilirubin Urine: NEGATIVE
Hgb urine dipstick: NEGATIVE
Ketones, ur: NEGATIVE
Leukocytes,Ua: NEGATIVE
Nitrite: NEGATIVE
Specific Gravity, Urine: 1.015 (ref 1.000–1.030)
Total Protein, Urine: NEGATIVE
Urine Glucose: NEGATIVE
Urobilinogen, UA: 0.2 (ref 0.0–1.0)
pH: 7.5 (ref 5.0–8.0)

## 2023-10-14 LAB — TSH: TSH: 3.62 u[IU]/mL (ref 0.35–5.50)

## 2023-10-14 MED ORDER — OXYCODONE HCL 10 MG PO TABS: 10.0000 mg | ORAL_TABLET | Freq: Three times a day (TID) | ORAL | 0 refills | Status: DC | PRN

## 2023-10-14 NOTE — Assessment & Plan Note (Signed)
 Using BPC-157 peptide for healing and methylene blue for memory Off IV abx Ancef 

## 2023-10-14 NOTE — Assessment & Plan Note (Signed)
 Using BPC-157 peptide for healing and methylene blue for memory

## 2023-10-14 NOTE — Progress Notes (Signed)
 Subjective:  Patient ID: Paul [name]., male    DOB: [birth year]  Age: 78 y.o. MRN: [id number]  CC: Medical Management of Chronic Issues (4 week f/u and discuss supplements and progressing with healing process )   HPI Paul [name]. presents for L knee pain - severe pain at night F/u on candidiasis, anticoagulation Using BPC-157 peptide for healing and methylene blue for memory   Outpatient Medications Prior to Visit  Medication Sig Dispense Refill   atorvastatin  (LIPITOR) 10 MG tablet Take 1 tablet by mouth daily. 90 tablet 2   buPROPion  (WELLBUTRIN  XL) 150 MG 24 hr tablet Take 1 tablet by mouth daily. 90 tablet 1   celecoxib (CELEBREX) 200 MG capsule Take 200 mg by mouth 2 (two) times daily as needed.     cholecalciferol  (VITAMIN D3) 25 MCG (1000 UNIT) tablet Take 1,000 Units by mouth daily.     diazepam  (VALIUM ) 5 MG tablet Take by mouth.     finasteride  (PROSCAR ) 5 MG tablet Take 1 tablet by mouth daily. 90 tablet 3   furosemide  (LASIX ) 20 MG tablet TAKE 1 TO 2 TABLETS BY MOUTH DAILY AS NEEDED (Patient taking differently: Take 20-40 mg by mouth daily as needed for fluid.) 180 tablet 1   magnesium  oxide (MAG-OX) 400 (240 Mg) MG tablet Take 1 tablet (400 mg total) by mouth daily. 30 tablet 0   methocarbamol  (ROBAXIN ) 500 MG tablet Take 500 mg by mouth every 8 (eight) hours as needed.     metoprolol  tartrate (LOPRESSOR ) 25 MG tablet Take 1 tablet (25 mg total) by mouth 2 (two) times daily.     Multiple Vitamin (MULTIVITAMIN ADULT PO) Take 3 capsules by mouth in the morning.     pantoprazole  (PROTONIX ) 40 MG tablet Take 1 tablet by mouth daily. Annual appointment due in October, must see provider for future refills. (Patient taking differently: Take 40 mg by mouth daily.) 90 tablet 3   triamterene -hydrochlorothiazide  (MAXZIDE -25) 37.5-25 MG tablet Take 1 tablet by mouth daily. 90 tablet 3   valsartan  (DIOVAN ) 160 MG tablet TAKE 1 TABLET(160 MG) BY MOUTH DAILY 90 tablet 3    zolpidem  (AMBIEN ) 10 MG tablet TAKE 1 TABLET BY MOUTH AT BEDTIME AS NEEDED (Patient taking differently: Take 10 mg by mouth at bedtime as needed for sleep. TAKE 1 TABLET BY MOUTH AT BEDTIME AS NEEDED) 90 tablet 1   budesonide -formoterol  (SYMBICORT ) 80-4.5 MCG/ACT inhaler Inhale 2 puffs into the lungs 2 (two) times daily. 1 each 12   chlorhexidine  (HIBICLENS ) 4 % external liquid Apply 15 mLs (1 Application total) topically as directed for 30 doses. Use as directed daily for 5 days every other week for 6 weeks. (Patient not taking: Reported on 09/16/2023) 946 mL 1   ELIQUIS  5 MG TABS tablet Take 5 mg by mouth 2 (two) times daily.     fluconazole  (DIFLUCAN ) 100 MG tablet Take 2 tabs on day#1, then 1 tab daily on Days #2-10 11 tablet 1   ondansetron  (ZOFRAN -ODT) 4 MG disintegrating tablet SMARTSIG:1 Tablet(s) By Mouth 1-3 Times Daily PRN     Oxycodone  HCl 10 MG TABS Take 1 tablet (10 mg total) by mouth 3 (three) times daily as needed. 90 tablet 0   polyethylene glycol (MIRALAX  / GLYCOLAX ) 17 g packet Take 17 g by mouth 2 (two) times daily.     predniSONE  (DELTASONE ) 10 MG tablet Take by mouth.     No facility-administered medications prior to visit.    ROS: Review  of Systems  Constitutional:  Negative for appetite change, fatigue and unexpected weight change.  HENT:  Negative for congestion, nosebleeds, sneezing, sore throat and trouble swallowing.   Eyes:  Negative for itching and visual disturbance.  Respiratory:  Negative for cough.   Cardiovascular:  Negative for chest pain, palpitations and leg swelling.  Gastrointestinal:  Negative for abdominal distention, blood in stool, diarrhea and nausea.  Genitourinary:  Negative for frequency and hematuria.  Musculoskeletal:  Positive for arthralgias, back pain, gait problem, joint swelling and neck stiffness. Negative for neck pain.  Skin:  Negative for rash.  Neurological:  Positive for weakness. Negative for dizziness, tremors and speech difficulty.   Psychiatric/Behavioral:  Negative for agitation, dysphoric mood and sleep disturbance. The patient is not nervous/anxious.     Objective:  BP 129/83   Pulse 81   Temp 97.9 F (36.6 C) (Oral)   Ht 6' (1.829 m)   Wt 169 lb (76.7 kg)   SpO2 99%   BMI 22.92 kg/m   BP Readings from Last 3 Encounters:  10/14/23 129/83  09/16/23 110/62  09/11/23 106/63    Wt Readings from Last 3 Encounters:  10/14/23 169 lb (76.7 kg)  09/11/23 169 lb 6.4 oz (76.8 kg)  08/28/23 177 lb 14.6 oz (80.7 kg)    Physical Exam Constitutional:      General: He is not in acute distress.    Appearance: Normal appearance. He is well-developed.     Comments: NAD  Eyes:     Conjunctiva/sclera: Conjunctivae normal.     Pupils: Pupils are equal, round, and reactive to light.  Neck:     Thyroid : No thyromegaly.     Vascular: No JVD.  Cardiovascular:     Rate and Rhythm: Normal rate and regular rhythm.     Heart sounds: Normal heart sounds. No murmur heard.    No friction rub. No gallop.  Pulmonary:     Effort: Pulmonary effort is normal. No respiratory distress.     Breath sounds: Normal breath sounds. No wheezing or rales.  Chest:     Chest wall: No tenderness.  Abdominal:     General: Bowel sounds are normal. There is no distension.     Palpations: Abdomen is soft. There is no mass.     Tenderness: There is no abdominal tenderness. There is no guarding or rebound.  Musculoskeletal:        General: Tenderness present. Normal range of motion.     Cervical back: Normal range of motion.     Right lower leg: No edema.     Left lower leg: No edema.  Lymphadenopathy:     Cervical: No cervical adenopathy.  Skin:    General: Skin is warm and dry.     Findings: No rash.  Neurological:     Mental Status: He is alert and oriented to person, place, and time.     Cranial Nerves: No cranial nerve deficit.     Motor: No abnormal muscle tone.     Coordination: Coordination normal.     Gait: Gait normal.      Deep Tendon Reflexes: Reflexes are normal and symmetric.  Psychiatric:        Behavior: Behavior normal.        Thought Content: Thought content normal.        Judgment: Judgment normal.   L knee w/swelling, pain on ROM In a brace Using a walker Thighs w/atrophy Looks, moves better   Lab Results  Component Value Date   WBC 18.4 (H) 09/05/2023   HGB 11.0 (L) 09/05/2023   HCT 34.4 (L) 09/05/2023   PLT 744 (H) 09/05/2023   GLUCOSE 113 (H) 09/05/2023   CHOL 190 05/31/2022   TRIG 144.0 05/31/2022   HDL 57.80 05/31/2022   LDLDIRECT 96.0 05/30/2020   LDLCALC 103 (H) 05/31/2022   ALT 19 09/05/2023   AST 30 09/05/2023   NA 134 (L) 09/05/2023   K 3.7 09/05/2023   CL 102 09/05/2023   CREATININE 0.87 09/05/2023   BUN 30 (H) 09/05/2023   CO2 23 09/05/2023   TSH 3.680 05/28/2023   PSA 0.16 05/31/2022   INR 1.0 08/12/2023    No results found.  Assessment & Plan:   Problem List Items Addressed This Visit     Hypoalbuminemia   Check labs today      Malnutrition related to chronic disease (HCC) - Primary   Using BPC-157 peptide for healing and methylene blue for memory      Paroxysmal atrial fibrillation (HCC)   Off Eliquis       Quadriceps tendon rupture   Using BPC-157 peptide for healing and methylene blue for memory      Septic arthritis (HCC)   Using BPC-157 peptide for healing and methylene blue for memory Off IV abx Ancef       Relevant Medications   Oxycodone  HCl 10 MG TABS      Meds ordered this encounter  Medications   Oxycodone  HCl 10 MG TABS    Sig: Take 1 tablet (10 mg total) by mouth 3 (three) times daily as needed.    Dispense:  90 tablet    Refill:  0    S76.119A. Please fill on or after 10/19/23      Follow-up: Return in about 2 months (around 12/14/2023) for a follow-up visit.  Marolyn Noel, MD

## 2023-10-14 NOTE — Assessment & Plan Note (Signed)
 Check labs today.

## 2023-10-14 NOTE — Assessment & Plan Note (Signed)
Off Eliquis

## 2023-10-15 ENCOUNTER — Ambulatory Visit: Payer: Self-pay | Admitting: Internal Medicine

## 2023-10-15 DIAGNOSIS — S76122D Laceration of left quadriceps muscle, fascia and tendon, subsequent encounter: Secondary | ICD-10-CM | POA: Diagnosis not present

## 2023-10-15 DIAGNOSIS — S76121D Laceration of right quadriceps muscle, fascia and tendon, subsequent encounter: Secondary | ICD-10-CM | POA: Diagnosis not present

## 2023-10-17 DIAGNOSIS — C4442 Squamous cell carcinoma of skin of scalp and neck: Secondary | ICD-10-CM | POA: Diagnosis not present

## 2023-10-17 DIAGNOSIS — D045 Carcinoma in situ of skin of trunk: Secondary | ICD-10-CM | POA: Diagnosis not present

## 2023-10-18 DIAGNOSIS — S76122D Laceration of left quadriceps muscle, fascia and tendon, subsequent encounter: Secondary | ICD-10-CM | POA: Diagnosis not present

## 2023-10-18 DIAGNOSIS — S76121D Laceration of right quadriceps muscle, fascia and tendon, subsequent encounter: Secondary | ICD-10-CM | POA: Diagnosis not present

## 2023-10-23 ENCOUNTER — Ambulatory Visit: Admitting: Family

## 2023-10-23 ENCOUNTER — Encounter: Payer: Self-pay | Admitting: Family

## 2023-10-23 ENCOUNTER — Other Ambulatory Visit: Payer: Self-pay

## 2023-10-23 VITALS — BP 128/80 | HR 86 | Temp 97.6°F

## 2023-10-23 DIAGNOSIS — B9561 Methicillin susceptible Staphylococcus aureus infection as the cause of diseases classified elsewhere: Secondary | ICD-10-CM

## 2023-10-23 DIAGNOSIS — R7881 Bacteremia: Secondary | ICD-10-CM | POA: Diagnosis not present

## 2023-10-23 DIAGNOSIS — M00062 Staphylococcal arthritis, left knee: Secondary | ICD-10-CM | POA: Diagnosis not present
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
## 2023-10-23 NOTE — Assessment & Plan Note (Signed)
 Mr. Vandergriff has completed treatment for MSSA septic arthritis of the left knee on 09/26/23 and has been progressing well with no further evidence of infection at this time. Discussed plan of care with no additional antibiotics at this time and reviewed the signs/symptoms of when to seek care that would be concerning for recurrent or new infection. Continue physical therapy and follow up per Orthopedics. Follow up with ID as needed.

## 2023-10-23 NOTE — Patient Instructions (Addendum)
 Nice to see you.  No additional antibiotics or follow up with ID needed.   Have a great day and stay safe!

## 2023-10-23 NOTE — Assessment & Plan Note (Signed)
 MSSA bacteremia cleared prior to leaving the hospital with source being left knee. No additional treatment needed.

## 2023-10-30 DIAGNOSIS — S76122D Laceration of left quadriceps muscle, fascia and tendon, subsequent encounter: Secondary | ICD-10-CM | POA: Diagnosis not present

## 2023-10-30 DIAGNOSIS — S76121D Laceration of right quadriceps muscle, fascia and tendon, subsequent encounter: Secondary | ICD-10-CM | POA: Diagnosis not present

## 2023-11-05 DIAGNOSIS — S76121D Laceration of right quadriceps muscle, fascia and tendon, subsequent encounter: Secondary | ICD-10-CM | POA: Diagnosis not present

## 2023-11-05 DIAGNOSIS — S76122D Laceration of left quadriceps muscle, fascia and tendon, subsequent encounter: Secondary | ICD-10-CM | POA: Diagnosis not present

## 2023-11-07 DIAGNOSIS — S76122D Laceration of left quadriceps muscle, fascia and tendon, subsequent encounter: Secondary | ICD-10-CM | POA: Diagnosis not present

## 2023-11-07 DIAGNOSIS — S76121D Laceration of right quadriceps muscle, fascia and tendon, subsequent encounter: Secondary | ICD-10-CM | POA: Diagnosis not present
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
## 2023-11-15 ENCOUNTER — Ambulatory Visit: Admitting: Family Medicine

## 2023-11-15 ENCOUNTER — Encounter: Payer: Self-pay | Admitting: Family Medicine

## 2023-11-15 ENCOUNTER — Encounter: Payer: Self-pay | Admitting: Internal Medicine

## 2023-11-15 ENCOUNTER — Ambulatory Visit

## 2023-11-15 ENCOUNTER — Other Ambulatory Visit: Payer: Self-pay

## 2023-11-15 VITALS — BP 138/60 | HR 77 | Ht 72.0 in | Wt 185.8 lb

## 2023-11-15 DIAGNOSIS — M47816 Spondylosis without myelopathy or radiculopathy, lumbar region: Secondary | ICD-10-CM | POA: Diagnosis not present

## 2023-11-15 DIAGNOSIS — M25551 Pain in right hip: Secondary | ICD-10-CM

## 2023-11-15 DIAGNOSIS — M7601 Gluteal tendinitis, right hip: Secondary | ICD-10-CM | POA: Diagnosis not present

## 2023-11-15 DIAGNOSIS — G8929 Other chronic pain: Secondary | ICD-10-CM | POA: Diagnosis not present

## 2023-11-15 DIAGNOSIS — M25512 Pain in left shoulder: Secondary | ICD-10-CM | POA: Diagnosis not present

## 2023-11-15 DIAGNOSIS — S43432A Superior glenoid labrum lesion of left shoulder, initial encounter: Secondary | ICD-10-CM

## 2023-11-15 DIAGNOSIS — M00062 Staphylococcal arthritis, left knee: Secondary | ICD-10-CM

## 2023-11-15 DIAGNOSIS — S76119D Strain of unspecified quadriceps muscle, fascia and tendon, subsequent encounter: Secondary | ICD-10-CM

## 2023-11-15 NOTE — Assessment & Plan Note (Signed)
 Patient given injection and tolerated the procedure well, discussed that I do think that this is been aggravated secondary to how patient has had to compensate for his septic joint on the contralateral side and deconditioning.  Will be doing physical therapy on a regular basis.  Discussed icing regimen and home exercises, increase activity slowly.  Discussed icing regimen.  Follow-up again in 2 months

## 2023-11-15 NOTE — Assessment & Plan Note (Signed)
 Patient does have a defect noted of the right sided area.  Patient had difficulty and likely had some exacerbation of the right side secondary to the septic joint on the left side.

## 2023-11-15 NOTE — Assessment & Plan Note (Signed)
 Patient was significantly ill but is gaining weight.  Still warmness.  We discussed that maybe repeating labs again to make sure there is no elevation in the sedimentation rate or white blood cell count.  Otherwise continue to be active.  Discussed icing regimen potential compression.  Follow-up with me again 6 to 8 weeks.

## 2023-11-15 NOTE — Assessment & Plan Note (Signed)
 Chronic, noted some underlying arthritic changes noted as well.  Discussed icing regimen and home exercises, discussed which activities to do and which ones to avoid.  Increase activity slowly.  Discussed icing regimen.  Hopefully this will make benefit.  Likely aggravated with patient deconditioning from his knee and having to use the upper extremity on a much more regular basis.

## 2023-11-15 NOTE — Patient Instructions (Addendum)
 Good to see you. X-rays on the way out. Injected shoulder and hip today. Patellar straps. See me again in 2 to 3 months.

## 2023-11-22 DIAGNOSIS — S76121D Laceration of right quadriceps muscle, fascia and tendon, subsequent encounter: Secondary | ICD-10-CM | POA: Diagnosis not present

## 2023-11-22 DIAGNOSIS — S76122D Laceration of left quadriceps muscle, fascia and tendon, subsequent encounter: Secondary | ICD-10-CM | POA: Diagnosis not present

## 2023-11-25 ENCOUNTER — Ambulatory Visit: Payer: Self-pay | Admitting: Family Medicine

## 2023-12-02 ENCOUNTER — Telehealth: Payer: Self-pay

## 2023-12-02 NOTE — Telephone Encounter (Signed)
 Copied from CRM [id number]. Topic: Referral - Request for Referral >> Dec 02, 2023  8:34 AM Tiffini S wrote: Did the patient discuss referral with their provider in the last year? Yes (If No - schedule appointment) (If Yes - send message)  Appointment offered? Yes  Type of order/referral and detailed reason for visit: Orthopedic surgery for right knees  / had surgery back in February- not getting better   Preference of office, provider, location: Asked for provider recommendation   If referral order, have you been seen by this specialty before? Yes (If Yes, this issue or another issue? When? Where?  Can we respond through MyChart? No, please call the patient at [phone]

## 2023-12-03 DIAGNOSIS — Z961 Presence of intraocular lens: Secondary | ICD-10-CM | POA: Diagnosis not present

## 2023-12-03 DIAGNOSIS — H35373 Puckering of macula, bilateral: Secondary | ICD-10-CM | POA: Diagnosis not present

## 2023-12-03 NOTE — Telephone Encounter (Signed)
 Is it with Dr. Cristy or Dr. Beverley?  Thanks

## 2023-12-04 DIAGNOSIS — S76111A Strain of right quadriceps muscle, fascia and tendon, initial encounter: Secondary | ICD-10-CM | POA: Diagnosis not present

## 2023-12-04 DIAGNOSIS — S76112A Strain of left quadriceps muscle, fascia and tendon, initial encounter: Secondary | ICD-10-CM | POA: Diagnosis not present

## 2023-12-06 DIAGNOSIS — S76122D Laceration of left quadriceps muscle, fascia and tendon, subsequent encounter: Secondary | ICD-10-CM | POA: Diagnosis not present

## 2023-12-06 DIAGNOSIS — S76121D Laceration of right quadriceps muscle, fascia and tendon, subsequent encounter: Secondary | ICD-10-CM | POA: Diagnosis not present

## 2023-12-18 ENCOUNTER — Ambulatory Visit: Admitting: Internal Medicine

## 2023-12-19 ENCOUNTER — Other Ambulatory Visit: Payer: Self-pay | Admitting: Medical Genetics

## 2023-12-19 DIAGNOSIS — M6281 Muscle weakness (generalized): Secondary | ICD-10-CM | POA: Diagnosis not present

## 2023-12-19 DIAGNOSIS — Z9889 Other specified postprocedural states: Secondary | ICD-10-CM | POA: Diagnosis not present

## 2023-12-19 DIAGNOSIS — Z87828 Personal history of other (healed) physical injury and trauma: Secondary | ICD-10-CM | POA: Diagnosis not present

## 2023-12-24 ENCOUNTER — Other Ambulatory Visit: Payer: Self-pay | Admitting: Internal Medicine

## 2023-12-25 ENCOUNTER — Other Ambulatory Visit (HOSPITAL_COMMUNITY): Payer: Self-pay

## 2023-12-25 ENCOUNTER — Telehealth: Payer: Self-pay

## 2023-12-25 ENCOUNTER — Ambulatory Visit (INDEPENDENT_AMBULATORY_CARE_PROVIDER_SITE_OTHER): Admitting: Internal Medicine

## 2023-12-25 ENCOUNTER — Encounter: Payer: Self-pay | Admitting: Internal Medicine

## 2023-12-25 VITALS — BP 148/92 | HR 67 | Temp 98.0°F | Ht 72.0 in | Wt 191.4 lb

## 2023-12-25 DIAGNOSIS — D485 Neoplasm of uncertain behavior of skin: Secondary | ICD-10-CM | POA: Diagnosis not present

## 2023-12-25 DIAGNOSIS — L578 Other skin changes due to chronic exposure to nonionizing radiation: Secondary | ICD-10-CM | POA: Diagnosis not present

## 2023-12-25 DIAGNOSIS — S76119D Strain of unspecified quadriceps muscle, fascia and tendon, subsequent encounter: Secondary | ICD-10-CM | POA: Diagnosis not present

## 2023-12-25 DIAGNOSIS — F419 Anxiety disorder, unspecified: Secondary | ICD-10-CM

## 2023-12-25 DIAGNOSIS — C44729 Squamous cell carcinoma of skin of left lower limb, including hip: Secondary | ICD-10-CM | POA: Diagnosis not present

## 2023-12-25 DIAGNOSIS — M009 Pyogenic arthritis, unspecified: Secondary | ICD-10-CM

## 2023-12-25 DIAGNOSIS — L570 Actinic keratosis: Secondary | ICD-10-CM | POA: Diagnosis not present

## 2023-12-25 DIAGNOSIS — I10 Essential (primary) hypertension: Secondary | ICD-10-CM | POA: Diagnosis not present

## 2023-12-25 DIAGNOSIS — L821 Other seborrheic keratosis: Secondary | ICD-10-CM | POA: Diagnosis not present

## 2023-12-25 DIAGNOSIS — E291 Testicular hypofunction: Secondary | ICD-10-CM

## 2023-12-25 DIAGNOSIS — C4442 Squamous cell carcinoma of skin of scalp and neck: Secondary | ICD-10-CM | POA: Diagnosis not present

## 2023-12-25 DIAGNOSIS — D225 Melanocytic nevi of trunk: Secondary | ICD-10-CM | POA: Diagnosis not present

## 2023-12-25 DIAGNOSIS — Z85828 Personal history of other malignant neoplasm of skin: Secondary | ICD-10-CM | POA: Diagnosis not present

## 2023-12-25 LAB — COMPREHENSIVE METABOLIC PANEL WITH GFR
ALT: 18 U/L (ref 0–53)
AST: 32 U/L (ref 0–37)
Albumin: 4.4 g/dL (ref 3.5–5.2)
Alkaline Phosphatase: 45 U/L (ref 39–117)
BUN: 23 mg/dL (ref 6–23)
CO2: 29 meq/L (ref 19–32)
Calcium: 9.3 mg/dL (ref 8.4–10.5)
Chloride: 101 meq/L (ref 96–112)
Creatinine, Ser: 0.89 mg/dL (ref 0.40–1.50)
GFR: 81.57 mL/min (ref >60.00)
Glucose, Bld: 104 mg/dL — ABNORMAL HIGH (ref 70–99)
Potassium: 4.3 meq/L (ref 3.5–5.1)
Sodium: 137 meq/L (ref 135–145)
Total Bilirubin: 0.5 mg/dL (ref 0.2–1.2)
Total Protein: 6.6 g/dL (ref 6.0–8.3)

## 2023-12-25 LAB — CBC WITH DIFFERENTIAL/PLATELET
Basophils Absolute: 0 K/uL (ref 0.0–0.1)
Basophils Relative: 0.6 % (ref 0.0–3.0)
Eosinophils Absolute: 0.1 K/uL (ref 0.0–0.7)
Eosinophils Relative: 1.2 % (ref 0.0–5.0)
HCT: 43.5 % (ref 39.0–52.0)
Hemoglobin: 14.5 g/dL (ref 13.0–17.0)
Lymphocytes Relative: 19.6 % (ref 12.0–46.0)
Lymphs Abs: 1.5 K/uL (ref 0.7–4.0)
MCHC: 33.4 g/dL (ref 30.0–36.0)
MCV: 88.9 fl (ref 78.0–100.0)
Monocytes Absolute: 0.9 K/uL (ref 0.1–1.0)
Monocytes Relative: 11.8 % (ref 3.0–12.0)
Neutro Abs: 5.2 K/uL (ref 1.4–7.7)
Neutrophils Relative %: 66.8 % (ref 43.0–77.0)
Platelets: 298 K/uL (ref 150.0–400.0)
RBC: 4.89 Mil/uL (ref 4.22–5.81)
RDW: 13.9 % (ref 11.5–15.5)
WBC: 7.7 K/uL (ref 4.0–10.5)

## 2023-12-25 LAB — TESTOSTERONE: Testosterone: 193.15 ng/dL — ABNORMAL LOW (ref 300.00–890.00)

## 2023-12-25 MED ORDER — "BD INTEGRA SYRINGE 23G X 1"" 3 ML MISC": 3 refills | Status: DC

## 2023-12-25 MED ORDER — ZOLPIDEM TARTRATE 10 MG PO TABS
10.0000 mg | ORAL_TABLET | Freq: Every evening | ORAL | 1 refills | Status: AC | PRN
  Filled 2024-04-16: qty 90, 90d supply, fill #0

## 2023-12-25 MED ORDER — TESTOSTERONE CYPIONATE 200 MG/ML IM SOLN: 100.0000 mg | INTRAMUSCULAR | 1 refills | Status: AC

## 2023-12-25 MED ORDER — VALSARTAN 160 MG PO TABS: 160.0000 mg | ORAL_TABLET | Freq: Every day | ORAL | 3 refills | Status: DC

## 2023-12-25 NOTE — Assessment & Plan Note (Signed)
 Will ref to Dr Lonni Poli for a second opinion

## 2023-12-25 NOTE — Progress Notes (Signed)
 Subjective:  Patient ID: Paul [name]., male    DOB: 1945/07/09  Age: 78 y.o. MRN: [id number]  CC: No chief complaint on file.   HPI Shahmeer [employer]. presents for chronic LBP, leg pain, weakness... F/u on depression Rich would like to see Lonni GRADE. Vernetta, MD at [facility] for a second opinion  Rich gained 20 lbs  Outpatient Medications Prior to Visit  Medication Sig Dispense Refill   buPROPion  (WELLBUTRIN  XL) 150 MG 24 hr tablet Take 1 tablet by mouth daily. 90 tablet 1   cholecalciferol  (VITAMIN D3) 25 MCG (1000 UNIT) tablet Take 1,000 Units by mouth daily.     finasteride  (PROSCAR ) 5 MG tablet Take 1 tablet by mouth daily. 90 tablet 3   magnesium  oxide (MAG-OX) 400 (240 Mg) MG tablet Take 1 tablet (400 mg total) by mouth daily. 30 tablet 0   Multiple Vitamin (MULTIVITAMIN ADULT PO) Take 3 capsules by mouth in the morning.     pantoprazole  (PROTONIX ) 40 MG tablet Take 1 tablet by mouth daily. Annual appointment due in October, must see provider for future refills. 90 tablet 3   triamterene -hydrochlorothiazide  (MAXZIDE -25) 37.5-25 MG tablet Take 1 tablet by mouth daily. 90 tablet 3   valsartan  (DIOVAN ) 160 MG tablet TAKE 1 TABLET(160 MG) BY MOUTH DAILY 90 tablet 3   zolpidem  (AMBIEN ) 10 MG tablet TAKE 1 TABLET BY MOUTH AT BEDTIME AS NEEDED 90 tablet 1   atorvastatin  (LIPITOR) 10 MG tablet Take 1 tablet by mouth daily. (Patient not taking: Reported on 12/25/2023) 90 tablet 2   celecoxib (CELEBREX) 200 MG capsule Take 200 mg by mouth 2 (two) times daily as needed. (Patient not taking: Reported on 12/25/2023)     diazepam  (VALIUM ) 5 MG tablet Take by mouth. (Patient not taking: Reported on 12/25/2023)     furosemide  (LASIX ) 20 MG tablet TAKE 1 TO 2 TABLETS BY MOUTH DAILY AS NEEDED (Patient not taking: Reported on 12/25/2023) 180 tablet 1   methocarbamol  (ROBAXIN ) 500 MG tablet Take 500 mg by mouth every 8 (eight) hours as needed. (Patient not taking:  Reported on 12/25/2023)     metoprolol  tartrate (LOPRESSOR ) 25 MG tablet Take 1 tablet (25 mg total) by mouth 2 (two) times daily. (Patient not taking: Reported on 12/25/2023)     Oxycodone  HCl 10 MG TABS Take 1 tablet (10 mg total) by mouth 3 (three) times daily as needed. (Patient not taking: Reported on 12/25/2023) 90 tablet 0   No facility-administered medications prior to visit.    ROS: Review of Systems  Constitutional:  Negative for appetite change, fatigue and unexpected weight change.  HENT:  Negative for congestion, nosebleeds, sneezing, sore throat and trouble swallowing.   Eyes:  Negative for itching and visual disturbance.  Respiratory:  Negative for cough.   Cardiovascular:  Negative for chest pain, palpitations and leg swelling.  Gastrointestinal:  Negative for abdominal distention, blood in stool, diarrhea and nausea.  Genitourinary:  Negative for frequency and hematuria.  Musculoskeletal:  Positive for arthralgias, back pain and gait problem. Negative for joint swelling and neck pain.  Skin:  Negative for rash.  Neurological:  Negative for dizziness, tremors, speech difficulty and weakness.  Psychiatric/Behavioral:  Negative for agitation, dysphoric mood, sleep disturbance and suicidal ideas. The patient is nervous/anxious.     Objective:  BP (!) 148/92   Pulse 67   Temp 98 F (36.7 C) (Temporal)   Ht 6' (1.829 m)   Wt 191 lb  6 oz (86.8 kg)   SpO2 98%   BMI 25.96 kg/m   BP Readings from Last 3 Encounters:  12/25/23 (!) 148/92  11/15/23 138/60  10/23/23 128/80    Wt Readings from Last 3 Encounters:  12/25/23 191 lb 6 oz (86.8 kg)  11/15/23 185 lb 12.8 oz (84.3 kg)  10/14/23 169 lb (76.7 kg)    Physical Exam Constitutional:      General: He is not in acute distress.    Appearance: He is well-developed.     Comments: NAD  Eyes:     Conjunctiva/sclera: Conjunctivae normal.     Pupils: Pupils are equal, round, and reactive to light.  Neck:      Thyroid : No thyromegaly.     Vascular: No JVD.  Cardiovascular:     Rate and Rhythm: Normal rate and regular rhythm.     Heart sounds: Normal heart sounds. No murmur heard.    No friction rub. No gallop.  Pulmonary:     Effort: Pulmonary effort is normal. No respiratory distress.     Breath sounds: Normal breath sounds. No wheezing or rales.  Chest:     Chest wall: No tenderness.  Abdominal:     General: Bowel sounds are normal. There is no distension.     Palpations: Abdomen is soft. There is no mass.     Tenderness: There is no abdominal tenderness. There is no guarding or rebound.  Musculoskeletal:        General: No tenderness. Normal range of motion.     Cervical back: Normal range of motion.  Lymphadenopathy:     Cervical: No cervical adenopathy.  Skin:    General: Skin is warm and dry.     Findings: No rash.  Neurological:     Mental Status: He is alert and oriented to person, place, and time.     Cranial Nerves: No cranial nerve deficit.     Motor: Weakness present. No abnormal muscle tone.     Coordination: Coordination normal.     Gait: Gait abnormal.     Deep Tendon Reflexes: Reflexes are normal and symmetric.  Psychiatric:        Behavior: Behavior normal.        Thought Content: Thought content normal.        Judgment: Judgment normal.   R knee extensors - weak  Lab Results  Component Value Date   WBC 6.9 10/14/2023   HGB 13.2 10/14/2023   HCT 39.5 10/14/2023   PLT 401.0 (H) 10/14/2023   GLUCOSE 103 (H) 10/14/2023   CHOL 190 05/31/2022   TRIG 144.0 05/31/2022   HDL 57.80 05/31/2022   LDLDIRECT 96.0 05/30/2020   LDLCALC 103 (H) 05/31/2022   ALT 10 10/14/2023   AST 27 10/14/2023   NA 138 10/14/2023   K 4.4 10/14/2023   CL 101 10/14/2023   CREATININE 0.76 10/14/2023   BUN 25 (H) 10/14/2023   CO2 30 10/14/2023   TSH 3.62 10/14/2023   PSA 0.16 05/31/2022   INR 1.0 08/12/2023    No results found.  Assessment & Plan:   Problem List Items  Addressed This Visit     Anxiety disorder   Situational depression/ anxiety to to a lot of medical and surgical illness lately - worse On Wellbutrin  XL      Hypertension   Not taking Valsartan  - re-start valsartan  Dizzy w/Maxzide - take 1/2 tab daily Hydrate well      Relevant Medications   valsartan  (DIOVAN )  160 MG tablet   Hypogonadism in male   Not on testosterone  inj IM. Will re-start  Potential benefits of a long term sex steroid  use as well as potential risks  and complications were explained to the patient and were aknowledged. Check labs      Relevant Orders   CBC with Differential/Platelet   Testosterone    Comprehensive metabolic panel with GFR   Quadriceps tendon rupture - Primary   Relevant Orders   Ambulatory referral to Orthopedic Surgery   Septic arthritis (HCC)   Will ref to Dr Lonni Poli for a second opinion       Relevant Orders   Ambulatory referral to Orthopedic Surgery      Meds ordered this encounter  Medications   zolpidem  (AMBIEN ) 10 MG tablet    Sig: TAKE 1 TABLET BY MOUTH AT BEDTIME AS NEEDED    Dispense:  90 tablet    Refill:  1   valsartan  (DIOVAN ) 160 MG tablet    Sig: Take 1 tablet (160 mg total) by mouth daily.    Dispense:  90 tablet    Refill:  3   testosterone  cypionate (DEPOTESTOSTERONE CYPIONATE) 200 MG/ML injection    Sig: Inject 0.5 mLs (100 mg total) into the muscle once a week.    Dispense:  10 mL    Refill:  1   SYRINGE-NEEDLE, DISP, 3 ML (B-D INTEGRA SYRINGE) 23G X 1 3 ML MISC    Sig: Use weekly for IM injections    Dispense:  50 each    Refill:  3      Follow-up: Return in about 3 months (around 03/26/2024) for a follow-up visit.  Marolyn Noel, MD

## 2023-12-25 NOTE — Assessment & Plan Note (Signed)
Not taking Valsartan - re-start valsartan Dizzy w/Maxzide- take 1/2 tab daily Hydrate well 

## 2023-12-25 NOTE — Telephone Encounter (Signed)
 Pharmacy Patient Advocate Encounter   Received notification from Onbase that prior authorization for testosterone  cypionate (DEPOTESTOSTERONE CYPIONATE) 200 MG/ML injection  is required/requested.   Insurance verification completed.   The patient is insured through [facility].   Per test claim: PA required; PA submitted to above mentioned insurance via Latent Key/confirmation #/EOC [id number] Status is pending

## 2023-12-25 NOTE — Assessment & Plan Note (Signed)
 Situational depression/ anxiety to to a lot of medical and surgical illness lately - worse On Wellbutrin  XL

## 2023-12-25 NOTE — Assessment & Plan Note (Addendum)
 Not on testosterone  inj IM. Will re-start  Potential benefits of a long term sex steroid  use as well as potential risks  and complications were explained to the patient and were aknowledged. Check labs

## 2023-12-26 ENCOUNTER — Other Ambulatory Visit
Admission: RE | Admit: 2023-12-26 | Discharge: 2023-12-26 | Disposition: A | Discharge status: Home / Self Care | Payer: Self-pay | Source: Ambulatory Visit | Attending: Medical Genetics | Admitting: Medical Genetics

## 2023-12-27 ENCOUNTER — Other Ambulatory Visit (HOSPITAL_COMMUNITY): Payer: Self-pay

## 2023-12-27 DIAGNOSIS — M6281 Muscle weakness (generalized): Secondary | ICD-10-CM | POA: Diagnosis not present

## 2023-12-27 DIAGNOSIS — Z87828 Personal history of other (healed) physical injury and trauma: Secondary | ICD-10-CM | POA: Diagnosis not present

## 2023-12-27 NOTE — Telephone Encounter (Signed)
 Pharmacy Patient Advocate Encounter  Received notification from [facility] that Prior Authorization for Testosterone  Cypionate 200MG /ML intramuscular solution has been APPROVED to 10.15.26. Ran test claim, Copay is $0.00. This test claim was processed through [facility]- copay amounts may vary at other pharmacies due to pharmacy/plan contracts, or as the patient moves through the different stages of their insurance plan.   PA #/Case ID/Reference #: [id number]

## 2023-12-29 ENCOUNTER — Ambulatory Visit: Payer: Self-pay | Admitting: Internal Medicine

## 2023-12-30 DIAGNOSIS — M25562 Pain in left knee: Secondary | ICD-10-CM | POA: Diagnosis not present

## 2023-12-31 ENCOUNTER — Other Ambulatory Visit (HOSPITAL_COMMUNITY): Payer: Self-pay

## 2024-01-03 DIAGNOSIS — M66851 Spontaneous rupture of other tendons, right thigh: Secondary | ICD-10-CM | POA: Diagnosis not present

## 2024-01-03 DIAGNOSIS — M66852 Spontaneous rupture of other tendons, left thigh: Secondary | ICD-10-CM | POA: Diagnosis not present

## 2024-01-03 DIAGNOSIS — M25561 Pain in right knee: Secondary | ICD-10-CM | POA: Diagnosis not present

## 2024-01-03 DIAGNOSIS — M25562 Pain in left knee: Secondary | ICD-10-CM | POA: Diagnosis not present

## 2024-01-07 LAB — GENECONNECT MOLECULAR SCREEN: Genetic Analysis Overall Interpretation: NEGATIVE

## 2024-01-14 DIAGNOSIS — M25561 Pain in right knee: Secondary | ICD-10-CM | POA: Diagnosis not present
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
## 2024-01-15 ENCOUNTER — Ambulatory Visit: Admitting: Family Medicine

## 2024-01-17 DIAGNOSIS — M66851 Spontaneous rupture of other tendons, right thigh: Secondary | ICD-10-CM | POA: Diagnosis not present

## 2024-01-17 DIAGNOSIS — M66852 Spontaneous rupture of other tendons, left thigh: Secondary | ICD-10-CM | POA: Diagnosis not present

## 2024-01-22 ENCOUNTER — Encounter: Payer: Self-pay | Admitting: Family Medicine

## 2024-01-24 DIAGNOSIS — R600 Localized edema: Secondary | ICD-10-CM | POA: Diagnosis not present

## 2024-01-24 DIAGNOSIS — G8929 Other chronic pain: Secondary | ICD-10-CM | POA: Diagnosis not present

## 2024-01-24 DIAGNOSIS — M25562 Pain in left knee: Secondary | ICD-10-CM | POA: Diagnosis not present

## 2024-01-25 ENCOUNTER — Other Ambulatory Visit: Payer: Self-pay

## 2024-01-25 ENCOUNTER — Emergency Department (HOSPITAL_BASED_OUTPATIENT_CLINIC_OR_DEPARTMENT_OTHER)

## 2024-01-25 ENCOUNTER — Emergency Department (HOSPITAL_BASED_OUTPATIENT_CLINIC_OR_DEPARTMENT_OTHER)
Admission: EM | Admit: 2024-01-25 | Discharge: 2024-01-25 | Disposition: A | Discharge status: Home / Self Care | Attending: Emergency Medicine | Admitting: Emergency Medicine

## 2024-01-25 ENCOUNTER — Encounter (HOSPITAL_BASED_OUTPATIENT_CLINIC_OR_DEPARTMENT_OTHER): Payer: Self-pay

## 2024-01-25 DIAGNOSIS — Z79899 Other long term (current) drug therapy: Secondary | ICD-10-CM | POA: Diagnosis not present

## 2024-01-25 DIAGNOSIS — M7989 Other specified soft tissue disorders: Secondary | ICD-10-CM | POA: Diagnosis not present

## 2024-01-25 DIAGNOSIS — S61412A Laceration without foreign body of left hand, initial encounter: Secondary | ICD-10-CM | POA: Insufficient documentation

## 2024-01-25 DIAGNOSIS — M79605 Pain in left leg: Secondary | ICD-10-CM | POA: Diagnosis not present

## 2024-01-25 DIAGNOSIS — R600 Localized edema: Secondary | ICD-10-CM | POA: Diagnosis not present

## 2024-01-25 DIAGNOSIS — M25462 Effusion, left knee: Secondary | ICD-10-CM | POA: Diagnosis not present

## 2024-01-25 DIAGNOSIS — S6992XA Unspecified injury of left wrist, hand and finger(s), initial encounter: Secondary | ICD-10-CM | POA: Diagnosis not present

## 2024-01-25 DIAGNOSIS — X58XXXA Exposure to other specified factors, initial encounter: Secondary | ICD-10-CM | POA: Diagnosis not present

## 2024-01-25 LAB — COMPREHENSIVE METABOLIC PANEL WITH GFR
ALT: 19 U/L (ref 0–44)
AST: 35 U/L (ref 15–41)
Albumin: 4.1 g/dL (ref 3.5–5.0)
Alkaline Phosphatase: 71 U/L (ref 38–126)
Anion gap: 10 (ref 5–15)
BUN: 26 mg/dL — ABNORMAL HIGH (ref 8–23)
CO2: 27 mmol/L (ref 22–32)
Calcium: 10.3 mg/dL (ref 8.9–10.3)
Chloride: 101 mmol/L (ref 98–111)
Creatinine, Ser: 0.8 mg/dL (ref 0.61–1.24)
GFR, Estimated: >60 mL/min (ref >60)
Glucose, Bld: 116 mg/dL — ABNORMAL HIGH (ref 70–99)
Potassium: 4.4 mmol/L (ref 3.5–5.1)
Sodium: 137 mmol/L (ref 135–145)
Total Bilirubin: 0.6 mg/dL (ref 0.0–1.2)
Total Protein: 7.1 g/dL (ref 6.5–8.1)

## 2024-01-25 LAB — CBC WITH DIFFERENTIAL/PLATELET
Abs Immature Granulocytes: 0.06 K/uL (ref 0.00–0.07)
Basophils Absolute: 0.1 K/uL (ref 0.0–0.1)
Basophils Relative: 1 %
Eosinophils Absolute: 0 K/uL (ref 0.0–0.5)
Eosinophils Relative: 0 %
HCT: 42.6 % (ref 39.0–52.0)
Hemoglobin: 14.5 g/dL (ref 13.0–17.0)
Immature Granulocytes: 1 %
Lymphocytes Relative: 7 %
Lymphs Abs: 0.7 K/uL (ref 0.7–4.0)
MCH: 30.5 pg (ref 26.0–34.0)
MCHC: 34 g/dL (ref 30.0–36.0)
MCV: 89.5 fL (ref 80.0–100.0)
Monocytes Absolute: 0.3 K/uL (ref 0.1–1.0)
Monocytes Relative: 3 %
Neutro Abs: 8.9 K/uL — ABNORMAL HIGH (ref 1.7–7.7)
Neutrophils Relative %: 88 %
Platelets: 233 K/uL (ref 150–400)
RBC: 4.76 MIL/uL (ref 4.22–5.81)
RDW: 13.6 % (ref 11.5–15.5)
WBC: 10.1 K/uL (ref 4.0–10.5)
nRBC: 0 % (ref 0.0–0.2)

## 2024-01-25 MED ORDER — LIDOCAINE HCL (PF) 1 % IJ SOLN
30.0000 mL | Freq: Once | INTRAMUSCULAR | Status: AC
  Administered 2024-01-25: 30 mL
  Filled 2024-01-25: qty 30

## 2024-01-25 NOTE — Discharge Instructions (Addendum)
 Continue prednisone  as prescribed.  Please follow-up with Dr. Fransisco office Monday for follow-up of continued left knee swelling.  Return to ED sooner for any worsening symptoms including new fevers, warmth to the knee, purulent drainage from the knee, signs of infection.

## 2024-01-25 NOTE — ED Provider Notes (Signed)
 [place] EMERGENCY DEPARTMENT AT [facility] Provider Note   CSN: [id number] Arrival date & time: 01/25/24  1154     Patient presents with: Leg Pain (Left ) and Leg Swelling   Paul [name]. is a 78 y.o. male.  Patient is a 78 year old male with a history of bilateral knee replacements, dyslipidemia, hepatitis C, and previous septic joint who presents to the ED for increasing left leg swelling for the past 3 weeks.  States he first noticed the swelling below the left knee approximately 3 weeks ago.  He did go see his orthopedic doctor and was given steroids to help initially but swelling started to get worse again a week ago.  He states he went to the orthopedic walk-in clinic last evening and was given a shot of Toradol  and steroids.  He was also placed on a oral steroid regimen and has had 1 dose so far.  He states that orthopedics was not urgently concern for a DVT and scheduled him outpatient for 1 on Monday.  Patient states his wife wanted to get the scan done sooner prompting him to come to the ED today.  States pain in leg is worse when he walks on it.  States he has had blood clots in his arms before but never in his legs previously.  Notes last surgery in the knee was in June 2025 when he had a septic joint but has been doing well since then.  He is currently not on blood thinners.  Denies fall or injury.  No further complaints.  He is also requesting assistance with an abrasion on his hand after he scraped it on concrete last evening.  Tetanus is up-to-date.  No further complaints.  Leg Pain Associated symptoms: no fever        Prior to Admission medications   Medication Sig Start Date End Date Taking? Authorizing Provider  atorvastatin  (LIPITOR) 10 MG tablet Take 1 tablet by mouth daily. Patient not taking: Reported on 12/25/2023 10/14/23   [name], [name], MD  buPROPion  (WELLBUTRIN  XL) 150 MG 24 hr tablet Take 1 tablet by mouth daily. 06/17/23   [name],  Aleksei V, MD  celecoxib (CELEBREX) 200 MG capsule Take 200 mg by mouth 2 (two) times daily as needed. Patient not taking: Reported on 12/25/2023 07/04/23   [provider]  cholecalciferol  (VITAMIN D3) 25 MCG (1000 UNIT) tablet Take 1,000 Units by mouth daily.    [provider]  diazepam  (VALIUM ) 5 MG tablet Take by mouth. Patient not taking: Reported on 12/25/2023 09/05/23   [provider]  finasteride  (PROSCAR ) 5 MG tablet Take 1 tablet by mouth daily. 06/17/23   [name], Aleksei V, MD  furosemide  (LASIX ) 20 MG tablet TAKE 1 TO 2 TABLETS BY MOUTH DAILY AS NEEDED Patient not taking: Reported on 12/25/2023 06/12/23   [name], Aleksei V, MD  magnesium  oxide (MAG-OX) 400 (240 Mg) MG tablet Take 1 tablet (400 mg total) by mouth daily. 05/09/23   Love, Sharlet RAMAN, PA-C  methocarbamol  (ROBAXIN ) 500 MG tablet Take 500 mg by mouth every 8 (eight) hours as needed. Patient not taking: Reported on 12/25/2023 09/05/23   [provider]  metoprolol  tartrate (LOPRESSOR ) 25 MG tablet Take 1 tablet (25 mg total) by mouth 2 (two) times daily. Patient not taking: Reported on 12/25/2023 08/16/23   Odell Celinda Balo, MD  Multiple Vitamin (MULTIVITAMIN ADULT PO) Take 3 capsules by mouth in the morning.    [provider]  Oxycodone   HCl 10 MG TABS Take 1 tablet (10 mg total) by mouth 3 (three) times daily as needed. Patient not taking: Reported on 12/25/2023 10/14/23   [name], [name], MD  pantoprazole  (PROTONIX ) 40 MG tablet Take 1 tablet by mouth daily. Annual appointment due in October, must see provider for future refills. 07/15/23   [name], [name], MD  SYRINGE-NEEDLE, DISP, 3 ML (B-D INTEGRA SYRINGE) 23G X 1 3 ML MISC Use weekly for IM injections 12/25/23   [name], Aleksei V, MD  testosterone  cypionate (DEPOTESTOSTERONE CYPIONATE) 200 MG/ML injection Inject 0.5 mLs (100 mg total) into the muscle once a week. 12/25/23   [name], [name], MD   triamterene -hydrochlorothiazide  (MAXZIDE -25) 37.5-25 MG tablet Take 1 tablet by mouth daily. 06/17/23   [name], Aleksei V, MD  valsartan  (DIOVAN ) 160 MG tablet Take 1 tablet (160 mg total) by mouth daily. 12/25/23   [name], [name], MD  zolpidem  (AMBIEN ) 10 MG tablet TAKE 1 TABLET BY MOUTH AT BEDTIME AS NEEDED 12/25/23   [name], Aleksei V, MD    Allergies: Other    Review of Systems  Constitutional:  Negative for fever.  Musculoskeletal:  Positive for joint swelling.  Skin:  Negative for color change and wound.  All other systems reviewed and are negative.   Updated Vital Signs BP (!) 159/93 (BP Location: Right Arm)   Pulse 77   Temp 98.3 F (36.8 C) (Oral)   Resp 16   Ht 6' (1.829 m)   Wt 87 kg   SpO2 97%   BMI 26.01 kg/m   Physical Exam Constitutional:      Appearance: Normal appearance.  HENT:     Head: Normocephalic and atraumatic.     Mouth/Throat:     Mouth: Mucous membranes are moist.     Pharynx: Oropharynx is clear.  Cardiovascular:     Rate and Rhythm: Normal rate.     Pulses: Normal pulses.     Comments: DP pulses 2+ bilaterally Pulmonary:     Effort: Pulmonary effort is normal.  Musculoskeletal:     Comments: Ambulatory with a slight limp.  Tender to palpation over the left anterior knee and left posterior medial calf.  Left leg is edematous compared to the right but no erythema or wounds noted.  Skin:    General: Skin is warm and dry.  Neurological:     Mental Status: He is alert and oriented to person, place, and time.  Psychiatric:        Mood and Affect: Mood normal.        Behavior: Behavior normal.     (all labs ordered are listed, but only abnormal results are displayed) Labs Reviewed  COMPREHENSIVE METABOLIC PANEL WITH GFR - Abnormal; Notable for the following components:      Result Value   Glucose, Bld 116 (*)    BUN 26 (*)    All other components within normal limits  CBC WITH DIFFERENTIAL/PLATELET - Abnormal; Notable for  the following components:   Neutro Abs 8.9 (*)    All other components within normal limits  BODY FLUID CULTURE W GRAM STAIN  GLUCOSE, BODY FLUID OTHER            PROTEIN, BODY FLUID (OTHER)  SYNOVIAL CELL COUNT + DIFF, W/ CRYSTALS    EKG: None  Radiology: US  Venous Img Lower Unilateral Left Result Date: 01/25/2024 CLINICAL DATA:  leg swelling, pain, sent by ortho for dvt ruleout EXAM: LEFT LOWER EXTREMITY VENOUS DOPPLER ULTRASOUND TECHNIQUE: Gray-scale  sonography with compression, as well as color and duplex ultrasound, were performed to evaluate the deep venous system(s) from the level of the common femoral vein through the popliteal and proximal calf veins. COMPARISON:  August 26, 2023. FINDINGS: VENOUS Normal compressibility of the common femoral, superficial femoral, and popliteal veins, as well as the visualized calf veins. Visualized portions of profunda femoral vein and great saphenous vein unremarkable. No filling defects to suggest DVT on grayscale or color Doppler imaging. Doppler waveforms show normal direction of venous flow, normal respiratory plasticity and response to augmentation. Limited views of the contralateral common femoral vein are unremarkable. OTHER Subcutaneous edema within the calf. Within medial aspect of the LEFT posterior knee, there is a complicated fluid collection with internal debris. This measures 8.5 by 2.1 x 15.8 cm. Limitations: none IMPRESSION: 1. No evidence of LEFT lower extremity DVT. 2. There is a 15.8 cm complicated fluid collection within the medial aspect of the LEFT posterior knee. This may reflect an abscess or reflect previously noted complex joint effusion. Differential considerations include hematomas or seromas. 3. Calf edema. Electronically Signed   By: Corean Salter M.D.   On: 01/25/2024 13:28     .Joint Aspiration/Arthrocentesis  Date/Time: 01/25/2024 4:05 PM  Performed by: Neysa Thersia RAMAN, PA-C Authorized by: Neysa Thersia RAMAN, PA-C    Consent:    Consent obtained:  Verbal   Consent given by:  Patient   Risks discussed:  Bleeding and infection Universal protocol:    Procedure explained and questions answered to patient or proxy's satisfaction: yes     Site/side marked: yes     Patient identity confirmed:  Verbally with patient Location:    Location:  Knee   Knee:  L knee Anesthesia:    Anesthesia method:  Local infiltration   Local anesthetic:  Lidocaine  1% w/o epi Procedure details:    Preparation: Patient was prepped and draped in usual sterile fashion     Needle gauge:  22 G   Ultrasound guidance: no     Approach:  Medial   Aspirate amount:  5cc   Aspirate characteristics:  Bloody   Steroid injected: no     Specimen collected: no   Post-procedure details:    Dressing:  Gauze roll and sterile dressing   Procedure completion:  Tolerated well, no immediate complications .Laceration Repair  Date/Time: 01/25/2024 4:06 PM  Performed by: Neysa Thersia RAMAN, PA-C Authorized by: Neysa Thersia RAMAN, PA-C   Consent:    Consent obtained:  Verbal   Consent given by:  Patient   Risks discussed:  Infection Universal protocol:    Patient identity confirmed:  Verbally with patient Anesthesia:    Anesthesia method:  None Laceration details:    Location:  Hand   Hand location:  L palm   Length (cm):  1   Depth (mm):  1 Exploration:    Contaminated: no   Treatment:    Area cleansed with:  Saline   Amount of cleaning:  Standard   Irrigation solution:  Sterile saline   Irrigation volume:  20cc   Irrigation method:  Syringe   Debridement:  None   Undermining:  None Skin repair:    Repair method:  Tissue adhesive Approximation:    Approximation:  Close Repair type:    Repair type:  Simple Post-procedure details:    Dressing:  Open (no dressing)   Procedure completion:  Tolerated well, no immediate complications      Medications Ordered in the ED  lidocaine  (PF) (XYLOCAINE ) 1 % injection 30 mL (30 mLs  Infiltration Given by Other 01/25/24 1535)    Clinical Course as of 01/25/24 1611  Sat Jan 25, 2024  1500 Consulted Dr. Josefina with Dr. Fransisco office: Advised to attempt to tap the knee to draw fluid off and have him call Dr. Fransisco office to follow-up first thing Monday morning. [AY]    Clinical Course User Index [AY] Neysa Thersia RAMAN, PA-C                                Medical Decision Making Amount and/or Complexity of Data Reviewed Labs: ordered.  Risk Prescription drug management.    Patient is a 78 year old male with a history of previous knee surgeries who presents to the ED for increasing left leg swelling for the past 3 weeks.  Please see detailed HPI above.  On exam patient is alert and acute distress.  Physical exam is above.  Range of motion of the left knee is at baseline.  No obvious signs of cellulitis or septic joint.  Differential includes chronic joint effusion, DVT, septic arthritis.  CBC and CMP unremarkable.  Ultrasound of the left leg is negative for DVT.  There is a 15.8 cm complex fluid collection on the posterior medial knee.  This is noted to be a septic joint versus possible continued effusion that was present on previous imaging.  Patient sees Dr. Beverley with orthopedics. Dr. Josefina on call with this group advised to attempt to tap the knee and have patient call Dr. Fransisco office on Monday for follow-up.  Unfortunately a tap of the left knee unsuccessful from the medial suprapatellar and infrapatellar angles.  There was mild blood no obvious.  Patient otherwise well-appearing and stable for discharge home.  Dermabond was applied to the hand.  Advised to continue prednisone  and call orthopedics on Monday to schedule follow-up.  Return precautions provided.   Final diagnoses:  Left leg pain  Left leg swelling    ED Discharge Orders     None          Neysa Thersia RAMAN, [place] 01/25/24 1611    Rogelia Jerilynn RAMAN, MD 01/30/24 [phone]

## 2024-01-25 NOTE — ED Triage Notes (Signed)
 Arrives ambulatory to the ED with complaints of left leg swelling and pain x3 weeks. Patient reports chronic issues with his left leg/knee, but the symptoms have worsened and he was sent here rule out blood clots.

## 2024-01-27 ENCOUNTER — Encounter: Payer: Self-pay | Admitting: Orthopaedic Surgery

## 2024-01-27 ENCOUNTER — Ambulatory Visit (INDEPENDENT_AMBULATORY_CARE_PROVIDER_SITE_OTHER): Admitting: Orthopaedic Surgery

## 2024-01-27 DIAGNOSIS — M25562 Pain in left knee: Secondary | ICD-10-CM

## 2024-01-27 DIAGNOSIS — M25561 Pain in right knee: Secondary | ICD-10-CM

## 2024-01-27 DIAGNOSIS — G8929 Other chronic pain: Secondary | ICD-10-CM | POA: Diagnosis not present

## 2024-01-27 NOTE — Progress Notes (Signed)
 The patient is very pleasant 78 year old gentleman who comes here today for second opinion as a relates to issues with both of his knees.  He unfortunately had a hard mechanical fall when he tripped up with walking his dogs back in February this year.  He unfortunately had bilateral quad tendon ruptures.  He was taken to surgery by one of our colleagues in town for bilateral quad tendon repairs.  He unfortunately has had multiple surgeries on his left knee since then with recurrent synovitis and has had arthroscopic interventions with synovectomy's.  His last surgery I believe is back in June of this year.  He recently saw their orthopedic outpatient clinic this past Friday and has been on oral steroids.  He also had an anti-inflammatory injection with likely Toradol .  He went to the emergency room this weekend at [facility] and they did try to aspirate fluid from his knee but were unsuccessful.  He has had his knee evaluated under ultrasound as well.  They state chronically painful.  He denies any fever or chills.  His white blood cell count Saturday when he went to the med center was 10.1 which was elevated from a month ago.  This could be falsely low still from him being on prednisone .  Examination of both his knees today shows weakness with his quads.  There is a definite deficit in the quad on the right side but he is able to extend the knee but is very weak.  His left knee does show some slight swelling but there is no gross effusion when I have him lay flat.  There is some prepatellar swelling.  There is no redness and no drainage.  He does not feel sick and he does not have any fever and chills.  I did review all the notes and he understands this is very complex situation.  I am not sure that any other surgical intervention would be warranted acutely however I would like him to see my partner at [facility] Dr.Bokshan for his opinion as to other interventions that may be helpful for  this very active 78 year old gentleman.  He agrees with this referral as well.

## 2024-01-28 ENCOUNTER — Ambulatory Visit: Admitting: Family Medicine

## 2024-01-28 ENCOUNTER — Emergency Department (HOSPITAL_BASED_OUTPATIENT_CLINIC_OR_DEPARTMENT_OTHER): Admitting: Physical Therapy

## 2024-01-28 DIAGNOSIS — G8929 Other chronic pain: Secondary | ICD-10-CM

## 2024-01-28 DIAGNOSIS — R2689 Other abnormalities of gait and mobility: Secondary | ICD-10-CM
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
## 2024-01-29 ENCOUNTER — Encounter (HOSPITAL_BASED_OUTPATIENT_CLINIC_OR_DEPARTMENT_OTHER): Payer: Self-pay | Admitting: Physical Therapy

## 2024-01-29 ENCOUNTER — Other Ambulatory Visit: Payer: Self-pay

## 2024-02-04 ENCOUNTER — Encounter (HOSPITAL_BASED_OUTPATIENT_CLINIC_OR_DEPARTMENT_OTHER): Payer: Self-pay | Admitting: Physical Therapy

## 2024-02-10 ENCOUNTER — Ambulatory Visit (HOSPITAL_BASED_OUTPATIENT_CLINIC_OR_DEPARTMENT_OTHER): Payer: Self-pay | Attending: Orthopedic Surgery | Admitting: Physical Therapy

## 2024-02-10 ENCOUNTER — Encounter (HOSPITAL_BASED_OUTPATIENT_CLINIC_OR_DEPARTMENT_OTHER): Payer: Self-pay | Admitting: Physical Therapy

## 2024-02-10 DIAGNOSIS — R2689 Other abnormalities of gait and mobility: Secondary | ICD-10-CM | POA: Diagnosis present

## 2024-02-10 DIAGNOSIS — G8929 Other chronic pain: Secondary | ICD-10-CM | POA: Insufficient documentation

## 2024-02-10 DIAGNOSIS — M25561 Pain in right knee: Secondary | ICD-10-CM | POA: Insufficient documentation

## 2024-02-10 DIAGNOSIS — M25562 Pain in left knee: Secondary | ICD-10-CM | POA: Diagnosis present

## 2024-02-10 DIAGNOSIS — R498 Other voice and resonance disorders: Secondary | ICD-10-CM | POA: Diagnosis present
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
## 2024-02-17 ENCOUNTER — Encounter (HOSPITAL_BASED_OUTPATIENT_CLINIC_OR_DEPARTMENT_OTHER): Payer: Self-pay | Admitting: Physical Therapy

## 2024-02-17 ENCOUNTER — Ambulatory Visit (HOSPITAL_BASED_OUTPATIENT_CLINIC_OR_DEPARTMENT_OTHER): Payer: Self-pay | Admitting: Physical Therapy

## 2024-02-17 DIAGNOSIS — M25562 Pain in left knee: Secondary | ICD-10-CM | POA: Diagnosis not present

## 2024-02-17 DIAGNOSIS — G8929 Other chronic pain: Secondary | ICD-10-CM

## 2024-02-17 DIAGNOSIS — R2689 Other abnormalities of gait and mobility: Secondary | ICD-10-CM
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
## 2024-02-24 ENCOUNTER — Ambulatory Visit (HOSPITAL_BASED_OUTPATIENT_CLINIC_OR_DEPARTMENT_OTHER): Payer: Self-pay | Admitting: Physical Therapy

## 2024-02-24 ENCOUNTER — Encounter (HOSPITAL_BASED_OUTPATIENT_CLINIC_OR_DEPARTMENT_OTHER): Payer: Self-pay | Admitting: Physical Therapy

## 2024-02-24 DIAGNOSIS — G8929 Other chronic pain: Secondary | ICD-10-CM

## 2024-02-24 DIAGNOSIS — M25562 Pain in left knee: Secondary | ICD-10-CM | POA: Diagnosis not present

## 2024-02-24 DIAGNOSIS — R498 Other voice and resonance disorders: Secondary | ICD-10-CM

## 2024-02-24 DIAGNOSIS — R2689 Other abnormalities of gait and mobility: Secondary | ICD-10-CM
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
## 2024-02-28 ENCOUNTER — Encounter (HOSPITAL_BASED_OUTPATIENT_CLINIC_OR_DEPARTMENT_OTHER): Payer: Self-pay

## 2024-02-28 ENCOUNTER — Other Ambulatory Visit: Payer: Self-pay

## 2024-02-28 ENCOUNTER — Other Ambulatory Visit (HOSPITAL_BASED_OUTPATIENT_CLINIC_OR_DEPARTMENT_OTHER): Payer: Self-pay

## 2024-02-28 ENCOUNTER — Ambulatory Visit (INDEPENDENT_AMBULATORY_CARE_PROVIDER_SITE_OTHER): Admitting: Orthopaedic Surgery

## 2024-02-28 DIAGNOSIS — S76112S Strain of left quadriceps muscle, fascia and tendon, sequela: Secondary | ICD-10-CM | POA: Diagnosis not present

## 2024-02-28 DIAGNOSIS — M7052 Other bursitis of knee, left knee: Secondary | ICD-10-CM | POA: Diagnosis not present

## 2024-02-28 DIAGNOSIS — S76119A Strain of unspecified quadriceps muscle, fascia and tendon, initial encounter: Secondary | ICD-10-CM

## 2024-02-28 MED ORDER — METHYLPREDNISOLONE 4 MG PO TBPK
ORAL_TABLET | ORAL | 0 refills | Status: DC
  Filled 2024-02-28: qty 21, 6d supply, fill #0

## 2024-02-28 MED ORDER — SULFAMETHOXAZOLE-TRIMETHOPRIM 800-160 MG PO TABS
1.0000 | ORAL_TABLET | Freq: Two times a day (BID) | ORAL | 0 refills | Status: AC
  Filled 2024-02-28: qty 10, 5d supply, fill #0
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
## 2024-03-02 ENCOUNTER — Ambulatory Visit (HOSPITAL_BASED_OUTPATIENT_CLINIC_OR_DEPARTMENT_OTHER): Payer: Self-pay | Admitting: Physical Therapy

## 2024-03-02 ENCOUNTER — Encounter (HOSPITAL_BASED_OUTPATIENT_CLINIC_OR_DEPARTMENT_OTHER): Payer: Self-pay | Admitting: Physical Therapy

## 2024-03-02 DIAGNOSIS — M25562 Pain in left knee: Secondary | ICD-10-CM | POA: Diagnosis not present

## 2024-03-02 DIAGNOSIS — R2689 Other abnormalities of gait and mobility: Secondary | ICD-10-CM

## 2024-03-02 DIAGNOSIS — G8929 Other chronic pain: Secondary | ICD-10-CM
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
## 2024-03-09 ENCOUNTER — Encounter (HOSPITAL_BASED_OUTPATIENT_CLINIC_OR_DEPARTMENT_OTHER): Payer: Self-pay | Admitting: Physical Therapy

## 2024-03-15 ENCOUNTER — Other Ambulatory Visit: Payer: Self-pay

## 2024-03-15 ENCOUNTER — Emergency Department (HOSPITAL_COMMUNITY)

## 2024-03-15 ENCOUNTER — Inpatient Hospital Stay (HOSPITAL_COMMUNITY)

## 2024-03-15 ENCOUNTER — Encounter (HOSPITAL_COMMUNITY): Payer: Self-pay

## 2024-03-15 ENCOUNTER — Inpatient Hospital Stay (HOSPITAL_COMMUNITY)
Admission: EM | Admit: 2024-03-15 | Discharge: 2024-03-20 | DRG: 502 | Disposition: A | Discharge status: Home / Self Care | Attending: Internal Medicine | Admitting: Internal Medicine

## 2024-03-15 DIAGNOSIS — J4489 Other specified chronic obstructive pulmonary disease: Secondary | ICD-10-CM | POA: Diagnosis present

## 2024-03-15 DIAGNOSIS — Z23 Encounter for immunization: Secondary | ICD-10-CM

## 2024-03-15 DIAGNOSIS — Z86718 Personal history of other venous thrombosis and embolism: Secondary | ICD-10-CM

## 2024-03-15 DIAGNOSIS — Z981 Arthrodesis status: Secondary | ICD-10-CM

## 2024-03-15 DIAGNOSIS — M7110 Other infective bursitis, unspecified site: Secondary | ICD-10-CM

## 2024-03-15 DIAGNOSIS — K2100 Gastro-esophageal reflux disease with esophagitis, without bleeding: Secondary | ICD-10-CM

## 2024-03-15 DIAGNOSIS — M25562 Pain in left knee: Secondary | ICD-10-CM | POA: Diagnosis not present

## 2024-03-15 DIAGNOSIS — M71162 Other infective bursitis, left knee: Secondary | ICD-10-CM | POA: Diagnosis not present

## 2024-03-15 DIAGNOSIS — J449 Chronic obstructive pulmonary disease, unspecified: Secondary | ICD-10-CM | POA: Diagnosis not present

## 2024-03-15 DIAGNOSIS — F419 Anxiety disorder, unspecified: Secondary | ICD-10-CM | POA: Diagnosis present

## 2024-03-15 DIAGNOSIS — I159 Secondary hypertension, unspecified: Secondary | ICD-10-CM | POA: Diagnosis not present

## 2024-03-15 DIAGNOSIS — N400 Enlarged prostate without lower urinary tract symptoms: Secondary | ICD-10-CM | POA: Diagnosis present

## 2024-03-15 DIAGNOSIS — K219 Gastro-esophageal reflux disease without esophagitis: Secondary | ICD-10-CM | POA: Diagnosis present

## 2024-03-15 DIAGNOSIS — I10 Essential (primary) hypertension: Secondary | ICD-10-CM | POA: Diagnosis present

## 2024-03-15 DIAGNOSIS — Z79890 Hormone replacement therapy (postmenopausal): Secondary | ICD-10-CM

## 2024-03-15 DIAGNOSIS — Z7982 Long term (current) use of aspirin: Secondary | ICD-10-CM

## 2024-03-15 DIAGNOSIS — I2583 Coronary atherosclerosis due to lipid rich plaque: Secondary | ICD-10-CM | POA: Diagnosis not present

## 2024-03-15 DIAGNOSIS — S76111A Strain of right quadriceps muscle, fascia and tendon, initial encounter: Secondary | ICD-10-CM | POA: Diagnosis not present

## 2024-03-15 DIAGNOSIS — F17210 Nicotine dependence, cigarettes, uncomplicated: Secondary | ICD-10-CM | POA: Diagnosis present

## 2024-03-15 DIAGNOSIS — K5900 Constipation, unspecified: Secondary | ICD-10-CM | POA: Diagnosis present

## 2024-03-15 DIAGNOSIS — B9561 Methicillin susceptible Staphylococcus aureus infection as the cause of diseases classified elsewhere: Secondary | ICD-10-CM | POA: Diagnosis present

## 2024-03-15 DIAGNOSIS — M7042 Prepatellar bursitis, left knee: Secondary | ICD-10-CM | POA: Diagnosis present

## 2024-03-15 DIAGNOSIS — M009 Pyogenic arthritis, unspecified: Secondary | ICD-10-CM | POA: Diagnosis present

## 2024-03-15 DIAGNOSIS — I480 Paroxysmal atrial fibrillation: Secondary | ICD-10-CM | POA: Diagnosis present

## 2024-03-15 DIAGNOSIS — M66261 Spontaneous rupture of extensor tendons, right lower leg: Secondary | ICD-10-CM | POA: Diagnosis present

## 2024-03-15 DIAGNOSIS — Z79899 Other long term (current) drug therapy: Secondary | ICD-10-CM

## 2024-03-15 DIAGNOSIS — M00062 Staphylococcal arthritis, left knee: Secondary | ICD-10-CM | POA: Diagnosis present

## 2024-03-15 DIAGNOSIS — E782 Mixed hyperlipidemia: Secondary | ICD-10-CM | POA: Diagnosis present

## 2024-03-15 DIAGNOSIS — F32A Depression, unspecified: Secondary | ICD-10-CM | POA: Diagnosis present

## 2024-03-15 DIAGNOSIS — I2510 Atherosclerotic heart disease of native coronary artery without angina pectoris: Secondary | ICD-10-CM | POA: Diagnosis present

## 2024-03-15 DIAGNOSIS — Z888 Allergy status to other drugs, medicaments and biological substances status: Secondary | ICD-10-CM

## 2024-03-15 DIAGNOSIS — R7881 Bacteremia: Secondary | ICD-10-CM | POA: Diagnosis not present

## 2024-03-15 DIAGNOSIS — M00862 Arthritis due to other bacteria, left knee: Secondary | ICD-10-CM | POA: Diagnosis not present

## 2024-03-15 LAB — CBC WITH DIFFERENTIAL/PLATELET
Abs Immature Granulocytes: 0.13 K/uL — ABNORMAL HIGH (ref 0.00–0.07)
Basophils Absolute: 0.1 K/uL (ref 0.0–0.1)
Basophils Relative: 1 %
Eosinophils Absolute: 0 K/uL (ref 0.0–0.5)
Eosinophils Relative: 0 %
HCT: 47.9 % (ref 39.0–52.0)
Hemoglobin: 16.2 g/dL (ref 13.0–17.0)
Immature Granulocytes: 1 %
Lymphocytes Relative: 15 %
Lymphs Abs: 1.6 K/uL (ref 0.7–4.0)
MCH: 31.6 pg (ref 26.0–34.0)
MCHC: 33.8 g/dL (ref 30.0–36.0)
MCV: 93.6 fL (ref 80.0–100.0)
Monocytes Absolute: 1.1 K/uL — ABNORMAL HIGH (ref 0.1–1.0)
Monocytes Relative: 10 %
Neutro Abs: 8 K/uL — ABNORMAL HIGH (ref 1.7–7.7)
Neutrophils Relative %: 73 %
Platelets: 313 K/uL (ref 150–400)
RBC: 5.12 MIL/uL (ref 4.22–5.81)
RDW: 13.3 % (ref 11.5–15.5)
WBC: 10.9 K/uL — ABNORMAL HIGH (ref 4.0–10.5)
nRBC: 0 % (ref 0.0–0.2)

## 2024-03-15 LAB — I-STAT CG4 LACTIC ACID, ED
Lactic Acid, Venous: 2.1 mmol/L (ref 0.5–1.9)
Lactic Acid, Venous: 1.5 mmol/L (ref 0.5–1.9)

## 2024-03-15 LAB — COMPREHENSIVE METABOLIC PANEL WITH GFR
ALT: 17 U/L (ref 0–44)
AST: 52 U/L — ABNORMAL HIGH (ref 15–41)
Albumin: 4.1 g/dL (ref 3.5–5.0)
Alkaline Phosphatase: 55 U/L (ref 38–126)
Anion gap: 12 (ref 5–15)
BUN: 23 mg/dL (ref 8–23)
CO2: 20 mmol/L — ABNORMAL LOW (ref 22–32)
Calcium: 9.6 mg/dL (ref 8.9–10.3)
Chloride: 100 mmol/L (ref 98–111)
Creatinine, Ser: 0.87 mg/dL (ref 0.61–1.24)
GFR, Estimated: >60 mL/min
Glucose, Bld: 97 mg/dL (ref 70–99)
Potassium: 4.5 mmol/L (ref 3.5–5.1)
Sodium: 133 mmol/L — ABNORMAL LOW (ref 135–145)
Total Bilirubin: 0.6 mg/dL (ref 0.0–1.2)
Total Protein: 6.8 g/dL (ref 6.5–8.1)

## 2024-03-15 LAB — SYNOVIAL CELL COUNT + DIFF, W/ CRYSTALS
Crystals, Fluid: NONE SEEN
Eosinophils-Synovial: 0 % (ref 0–1)
Lymphocytes-Synovial Fld: 16 % (ref 0–20)
Monocyte-Macrophage-Synovial Fluid: 37 % — ABNORMAL LOW (ref 50–90)
Neutrophil, Synovial: 47 % — ABNORMAL HIGH (ref 0–25)
WBC, Synovial: 852 /mm3 — ABNORMAL HIGH (ref 0–200)

## 2024-03-15 LAB — CBG MONITORING, ED: Glucose-Capillary: 104 mg/dL — ABNORMAL HIGH (ref 70–99)

## 2024-03-15 MED ORDER — LACTATED RINGERS IV BOLUS
500.0000 mL | Freq: Once | INTRAVENOUS | Status: AC
  Administered 2024-03-15: 500 mL via INTRAVENOUS

## 2024-03-15 MED ORDER — TRIAMTERENE-HCTZ 37.5-25 MG PO TABS: 1.0000 | ORAL_TABLET | Freq: Every day | ORAL | Status: DC

## 2024-03-15 MED ORDER — VANCOMYCIN HCL IN DEXTROSE 1-5 GM/200ML-% IV SOLN: 1000.0000 mg | Freq: Once | INTRAVENOUS | Status: DC

## 2024-03-15 MED ORDER — HYDROMORPHONE HCL 1 MG/ML IJ SOLN
1.0000 mg | Freq: Once | INTRAMUSCULAR | Status: AC
  Administered 2024-03-15: 1 mg via INTRAVENOUS
  Filled 2024-03-15: qty 1

## 2024-03-15 MED ORDER — BUPROPION HCL ER (XL) 150 MG PO TB24
150.0000 mg | ORAL_TABLET | Freq: Every day | ORAL | Status: DC
  Administered 2024-03-16 – 2024-03-20 (×5): 150 mg via ORAL
  Filled 2024-03-15 (×5): qty 1

## 2024-03-15 MED ORDER — VANCOMYCIN HCL 1750 MG/350ML IV SOLN
1750.0000 mg | Freq: Once | INTRAVENOUS | Status: AC
  Administered 2024-03-15: 1750 mg via INTRAVENOUS
  Filled 2024-03-15: qty 350

## 2024-03-15 MED ORDER — LIDOCAINE-EPINEPHRINE (PF) 2 %-1:200000 IJ SOLN
5.0000 mL | INTRAMUSCULAR | Status: DC
  Administered 2024-03-15: 5 mL

## 2024-03-15 MED ORDER — GADOBUTROL 1 MMOL/ML IV SOLN
9.0000 mL | Freq: Once | INTRAVENOUS | Status: AC | PRN
  Administered 2024-03-15: 9 mL via INTRAVENOUS

## 2024-03-15 MED ORDER — LIDOCAINE-EPINEPHRINE (PF) 2 %-1:200000 IJ SOLN
10.0000 mL | Freq: Once | INTRAMUSCULAR | Status: AC
  Administered 2024-03-15: 10 mL via INTRADERMAL
  Filled 2024-03-15: qty 20

## 2024-03-15 MED ORDER — POLYETHYLENE GLYCOL 3350 17 G PO PACK
17.0000 g | PACK | Freq: Every day | ORAL | Status: DC | PRN
  Administered 2024-03-18: 17 g via ORAL
  Filled 2024-03-15: qty 1

## 2024-03-15 MED ORDER — SODIUM CHLORIDE 0.9 % IV SOLN
2.0000 g | Freq: Once | INTRAVENOUS | Status: AC
  Administered 2024-03-15: 2 g via INTRAVENOUS
  Filled 2024-03-15: qty 12.5

## 2024-03-15 MED ORDER — SODIUM CHLORIDE 0.9% FLUSH
3.0000 mL | Freq: Two times a day (BID) | INTRAVENOUS | Status: DC
  Administered 2024-03-16 – 2024-03-19 (×8): 3 mL via INTRAVENOUS

## 2024-03-15 MED ORDER — ATORVASTATIN CALCIUM 10 MG PO TABS
10.0000 mg | ORAL_TABLET | Freq: Every day | ORAL | Status: DC
  Administered 2024-03-16 – 2024-03-20 (×5): 10 mg via ORAL
  Filled 2024-03-15 (×5): qty 1

## 2024-03-15 MED ORDER — FINASTERIDE 5 MG PO TABS
5.0000 mg | ORAL_TABLET | Freq: Every day | ORAL | Status: DC
  Administered 2024-03-16 – 2024-03-20 (×5): 5 mg via ORAL
  Filled 2024-03-15 (×5): qty 1

## 2024-03-15 MED ORDER — HYDROCODONE-ACETAMINOPHEN 5-325 MG PO TABS
1.0000 | ORAL_TABLET | Freq: Four times a day (QID) | ORAL | Status: DC | PRN
  Administered 2024-03-15 – 2024-03-18 (×10): 2 via ORAL
  Administered 2024-03-18: 1 via ORAL
  Administered 2024-03-19 – 2024-03-20 (×6): 2 via ORAL
  Filled 2024-03-15 (×17): qty 2

## 2024-03-15 MED ORDER — PANTOPRAZOLE SODIUM 40 MG PO TBEC
40.0000 mg | DELAYED_RELEASE_TABLET | Freq: Every day | ORAL | Status: DC
  Administered 2024-03-16 – 2024-03-20 (×5): 40 mg via ORAL
  Filled 2024-03-15 (×5): qty 1

## 2024-03-15 MED ORDER — IRBESARTAN 150 MG PO TABS
150.0000 mg | ORAL_TABLET | Freq: Every day | ORAL | Status: DC
  Administered 2024-03-16 – 2024-03-20 (×5): 150 mg via ORAL
  Filled 2024-03-15 (×5): qty 1

## 2024-03-15 MED ORDER — HYDROMORPHONE HCL 1 MG/ML IJ SOLN
0.5000 mg | INTRAMUSCULAR | Status: DC | PRN
  Administered 2024-03-15 – 2024-03-20 (×15): 0.5 mg via INTRAVENOUS
  Filled 2024-03-15: qty 1
  Filled 2024-03-15: qty 0.5
  Filled 2024-03-15 (×2): qty 1
  Filled 2024-03-15 (×11): qty 0.5

## 2024-03-15 MED ORDER — ACETAMINOPHEN 325 MG PO TABS: 650.0000 mg | ORAL_TABLET | Freq: Four times a day (QID) | ORAL | Status: DC | PRN

## 2024-03-15 MED ORDER — VANCOMYCIN HCL IN DEXTROSE 1-5 GM/200ML-% IV SOLN
1000.0000 mg | Freq: Two times a day (BID) | INTRAVENOUS | Status: DC
  Administered 2024-03-16 – 2024-03-17 (×3): 1000 mg via INTRAVENOUS
  Filled 2024-03-15 (×3): qty 200

## 2024-03-15 MED ORDER — SODIUM CHLORIDE 0.9 % IV SOLN
2.0000 g | Freq: Three times a day (TID) | INTRAVENOUS | Status: DC
  Administered 2024-03-15 – 2024-03-17 (×5): 2 g via INTRAVENOUS
  Filled 2024-03-15 (×5): qty 12.5

## 2024-03-15 MED ORDER — ACETAMINOPHEN 650 MG RE SUPP: 650.0000 mg | Freq: Four times a day (QID) | RECTAL | Status: DC | PRN

## 2024-03-15 MED ORDER — HYDROMORPHONE HCL 1 MG/ML IJ SOLN
0.5000 mg | Freq: Once | INTRAMUSCULAR | Status: AC
  Administered 2024-03-15: 0.5 mg via INTRAVENOUS
  Filled 2024-03-15: qty 1

## 2024-03-15 NOTE — ED Provider Triage Note (Signed)
 Emergency Medicine Provider Triage Evaluation Note  Paul JONELLE Mardelle Mickey. , a 79 y.o. male  was evaluated in triage.  Pt complains of drainage from the left knee that started yesterday.  Reports that he has been having recurrent infections in the left knee after surgery on the knee.  He is not currently on any antibiotics.  Denies any fevers at home.  No new injuries.  Review of Systems  Positive: As above Negative: As above  Physical Exam  BP (!) 156/86   Pulse 93   Temp 97.7 F (36.5 C)   Resp 18   Ht 6' (1.829 m)   Wt 88.5 kg   SpO2 98%   BMI 26.45 kg/m  Gen:   Awake, no distress   Resp:  Normal effort  MSK:   Erythema and area of fluctuance to the anterior left knee   Medical Decision Making  Medically screening exam initiated at 12:14 PM.  Appropriate orders placed.  Paul JONELLE Mardelle Mickey. was informed that the remainder of the evaluation will be completed by another provider, this initial triage assessment does not replace that evaluation, and the importance of remaining in the ED until their evaluation is complete.     Veta Palma, PA-C 03/15/24 1214

## 2024-03-15 NOTE — ED Notes (Signed)
 Patient transported to MRI
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
## 2024-03-15 NOTE — ED Provider Notes (Signed)
 " [place] [facility] Provider Note   CSN: [id number] Arrival date & time: 03/15/24  1153     Patient presents with: Knee Problem   Paul [name]. is a 79 y.o. male.   HPI Patient presents for left knee pain and swelling.  Medical history includes anxiety, depression, GERD, HLD, HTN.  A year ago, he had bilateral quadricep tendon injuries.  He underwent repair with Dr. Beverley.  He subsequently sustained infection to the left knee which has undergone incision and drainage x 3 with Dr. Cristy.  The last of these was in June.  He followed up with Dr. Genelle 2 weeks ago.  At that time, he was prescribed steroid pack and Bactrim  for concern of left knee infected bursitis.  There is concern that he may need bursectomy in the future.  There was also concern of rerupture of his right quadriceps tendon.  Plan was for outpatient MRIs.  3 days ago, he noticed increased swelling to his left knee.  Since then, he has had worsening of pain, worsening of swelling.  He noticed purulent drainage from the anterior aspect of his knee today.  He denies any systemic symptoms.    Prior to Admission medications  Medication Sig Start Date End Date Taking? Authorizing Provider  atorvastatin  (LIPITOR) 10 MG tablet Take 1 tablet by mouth daily. Patient not taking: Reported on 12/25/2023 10/14/23   [name], [name], MD  buPROPion  (WELLBUTRIN  XL) 150 MG 24 hr tablet Take 1 tablet by mouth daily. 06/17/23   [name], Aleksei V, MD  celecoxib (CELEBREX) 200 MG capsule Take 200 mg by mouth 2 (two) times daily as needed. Patient not taking: Reported on 12/25/2023 07/04/23   [provider]  cholecalciferol  (VITAMIN D3) 25 MCG (1000 UNIT) tablet Take 1,000 Units by mouth daily.    [provider]  diazepam  (VALIUM ) 5 MG tablet Take by mouth. Patient not taking: Reported on 12/25/2023 09/05/23   [provider]  finasteride  (PROSCAR ) 5 MG tablet Take 1 tablet  by mouth daily. 06/17/23   [name], [name], MD  furosemide  (LASIX ) 20 MG tablet TAKE 1 TO 2 TABLETS BY MOUTH DAILY AS NEEDED Patient not taking: Reported on 12/25/2023 06/12/23   [name], Aleksei V, MD  magnesium  oxide (MAG-OX) 400 (240 Mg) MG tablet Take 1 tablet (400 mg total) by mouth daily. 05/09/23   Love, Sharlet RAMAN, PA-C  methocarbamol  (ROBAXIN ) 500 MG tablet Take 500 mg by mouth every 8 (eight) hours as needed. Patient not taking: Reported on 12/25/2023 09/05/23   [provider]  methylPREDNISolone  (MEDROL  DOSEPAK) 4 MG TBPK tablet Take 6 tablets by mouth on day 1, 5 tabs on day 2, 4 tabs on day 3, 3 tabs on day 4, 2 tabs on day 5, 1 tab on day 6. Then stop. 02/28/24   Genelle Standing, MD  metoprolol  tartrate (LOPRESSOR ) 25 MG tablet Take 1 tablet (25 mg total) by mouth 2 (two) times daily. Patient not taking: Reported on 12/25/2023 08/16/23   Odell Celinda Balo, MD  Multiple Vitamin (MULTIVITAMIN ADULT PO) Take 3 capsules by mouth in the morning.    [provider]  Oxycodone  HCl 10 MG TABS Take 1 tablet (10 mg total) by mouth 3 (three) times daily as needed. Patient not taking: Reported on 12/25/2023 10/14/23   [name], [name], MD  pantoprazole  (PROTONIX ) 40 MG tablet Take 1 tablet by mouth daily. Annual appointment due in October, must see provider  for future refills. 07/15/23   [name], Aleksei V, MD  SYRINGE-NEEDLE, DISP, 3 ML (B-D INTEGRA SYRINGE) 23G X 1 3 ML MISC Use weekly for IM injections 12/25/23   [name], Aleksei V, MD  testosterone  cypionate (DEPOTESTOSTERONE CYPIONATE) 200 MG/ML injection Inject 0.5 mLs (100 mg total) into the muscle once a week. 12/25/23   [name], Aleksei V, MD  triamterene -hydrochlorothiazide  (MAXZIDE -25) 37.5-25 MG tablet Take 1 tablet by mouth daily. 06/17/23   [name], Aleksei V, MD  valsartan  (DIOVAN ) 160 MG tablet Take 1 tablet (160 mg total) by mouth daily. 12/25/23   [name], [name], MD  zolpidem  (AMBIEN ) 10 MG tablet  TAKE 1 TABLET BY MOUTH AT BEDTIME AS NEEDED 12/25/23   [name], Aleksei V, MD    Allergies: Other    Review of Systems  Musculoskeletal:  Positive for arthralgias and joint swelling.  Skin:  Positive for wound.  All other systems reviewed and are negative.   Updated Vital Signs BP (!) 147/95   Pulse (!) 56   Temp 97.7 F (36.5 C)   Resp 19   Ht 6' (1.829 m)   Wt 88.5 kg   SpO2 100%   BMI 26.45 kg/m   Physical Exam Vitals and nursing note reviewed.  Constitutional:      General: He is not in acute distress.    Appearance: Normal appearance. He is well-developed. He is not ill-appearing, toxic-appearing or diaphoretic.  HENT:     Head: Normocephalic and atraumatic.     Right Ear: External ear normal.     Left Ear: External ear normal.     Nose: Nose normal.     Mouth/Throat:     Mouth: Mucous membranes are moist.  Eyes:     Extraocular Movements: Extraocular movements intact.     Conjunctiva/sclera: Conjunctivae normal.  Cardiovascular:     Rate and Rhythm: Normal rate and regular rhythm.  Pulmonary:     Effort: Pulmonary effort is normal. No respiratory distress.  Abdominal:     General: There is no distension.     Palpations: Abdomen is soft.  Musculoskeletal:        General: Swelling and tenderness present. Normal range of motion.     Cervical back: Normal range of motion and neck supple.  Skin:    General: Skin is warm and dry.     Findings: Erythema present.  Neurological:     General: No focal deficit present.     Mental Status: He is alert and oriented to person, place, and time.     Cranial Nerves: No cranial nerve deficit.     Sensory: No sensory deficit.     Motor: No weakness.     Coordination: Coordination normal.  Psychiatric:        Mood and Affect: Mood normal.        Behavior: Behavior normal.     (all labs ordered are listed, but only abnormal results are displayed) Labs Reviewed  CBC WITH DIFFERENTIAL/PLATELET - Abnormal; Notable for  the following components:      Result Value   WBC 10.9 (*)    Neutro Abs 8.0 (*)    Monocytes Absolute 1.1 (*)    Abs Immature Granulocytes 0.13 (*)    All other components within normal limits  COMPREHENSIVE METABOLIC PANEL WITH GFR - Abnormal; Notable for the following components:   Sodium 133 (*)    CO2 20 (*)    AST 52 (*)    All other components within normal  limits  I-STAT CG4 LACTIC ACID, ED - Abnormal; Notable for the following components:   Lactic Acid, Venous 2.1 (*)    All other components within normal limits  CULTURE, BLOOD (ROUTINE X 2)  CULTURE, BLOOD (ROUTINE X 2)  BODY FLUID CULTURE W GRAM STAIN  SYNOVIAL CELL COUNT + DIFF, W/ CRYSTALS  GLUCOSE, BODY FLUID OTHER            I-STAT CG4 LACTIC ACID, ED  CBG MONITORING, ED    EKG: None  Radiology: DG Knee 2 Views Left Result Date: 03/15/2024 CLINICAL DATA:  Knee pain, known staph infection of left knee. EXAM: LEFT KNEE - 1-2 VIEW COMPARISON:  08/12/2023. FINDINGS: No acute fracture or dislocation is seen. No bony erosions or periosteal elevation. There is a small suprapatellar joint effusion. Mild degenerative changes are noted in the medial patellofemoral compartments. A dystrophic calcification is noted in the soft tissues, likely related to prior trauma. Soft tissue swelling is present anterior to the knee. Vascular calcifications are noted in the soft tissues. IMPRESSION: 1. No acute osseous abnormality. 2. Soft tissue swelling anterior to the knee and small joint effusion. Electronically Signed   By: Leita Birmingham M.D.   On: 03/15/2024 15:39     .Joint Aspiration/Arthrocentesis: L knee  Date/Time: 03/15/2024 4:00 PM  Performed by: Melvenia Motto, MD Authorized by: Melvenia Motto, MD   Consent:    Consent obtained:  Verbal   Consent given by:  Patient   Risks, benefits, and alternatives were discussed: yes     Risks discussed:  Bleeding, pain, incomplete drainage and infection   Alternatives discussed:  No  treatment and delayed treatment Universal protocol:    Procedure explained and questions answered to patient or proxy's satisfaction: yes     Patient identity confirmed:  Verbally with patient Location:    Location:  Knee   Knee:  L knee Anesthesia:    Anesthesia method:  Local infiltration   Local anesthetic:  5 mL lidocaine -EPINEPHrine  2 %-1:200000 Procedure details:    Preparation: Patient was prepped and draped in usual sterile fashion     Needle gauge:  18 G   Ultrasound guidance: no     Approach:  Superior   Aspirate amount:  4mL   Aspirate characteristics:  Blood-tinged   Steroid injected: no   Post-procedure details:    Dressing:  Adhesive bandage   Procedure completion:  Tolerated well, no immediate complications    Medications Ordered in the ED  ceFEPIme  (MAXIPIME ) 2 g in sodium chloride  0.9 % 100 mL IVPB (2 g Intravenous New Bag/Given 03/15/24 1620)  vancomycin  (VANCOREADY) IVPB 1750 mg/350 mL (has no administration in time range)  HYDROmorphone  (DILAUDID ) injection 0.5 mg (0.5 mg Intravenous Given 03/15/24 1457)  lactated ringers  bolus 500 mL (0 mLs Intravenous Stopped 03/15/24 1610)  lidocaine -EPINEPHrine  (XYLOCAINE  W/EPI) 2 %-1:200000 (PF) injection 10 mL (10 mLs Intradermal Given 03/15/24 1548)  HYDROmorphone  (DILAUDID ) injection 1 mg (1 mg Intravenous Given 03/15/24 1617)                                    Medical Decision Making Amount and/or Complexity of Data Reviewed Labs: ordered. Radiology: ordered.  Risk Prescription drug management. Decision regarding hospitalization.   This patient presents to the ED for concern of left knee pain, this involves an extensive number of treatment options, and is a complaint that carries with it a high risk  of complications and morbidity.  The differential diagnosis includes arthritis, septic arthritis, gout, pseudogout, occult injury   Co morbidities / Chronic conditions that complicate the patient evaluation  anxiety,  depression, GERD, HLD, HTN   Additional history obtained:  Additional history obtained from EMR External records from outside source obtained and reviewed including N/A   Lab Tests:  I Ordered, and personally interpreted labs.  The pertinent results include: Mild leukocytosis is present.   Imaging Studies ordered:  I ordered imaging studies including left knee x-ray I independently visualized and interpreted imaging which showed soft tissue swelling with small effusion I agree with the radiologist interpretation   Cardiac Monitoring: / EKG:  The patient was maintained on a cardiac monitor.  I personally viewed and interpreted the cardiac monitored which showed an underlying rhythm of: Sinus rhythm   Problem List / ED Course / Critical interventions / Medication management  Patient presenting for left knee pain and swelling.  He has had recurrent infections to this knee after surgical repair of quadriceps tendon last year.  His new symptoms started 3 days ago.  Since that time, he has had increased swelling, pain, and redness to his left knee.  He did have purulent drainage from the anterior aspect today.  On arrival in the ED, patient is overall well-appearing.  Left knee is swollen, erythematous, warm, and tender.  Dilaudid  was ordered for analgesia.  Workup was initiated.  Initially spoke with orthopedic surgeon, Dr. Celena, who recommended arthrocentesis, antibiotics, and admission.  Dr. Celena reviewed chart and, given that patient last saw Dr. Genelle, did recommend that I reach out to Dr. Harden.  Patient underwent bedside arthrocentesis.  Broad-spectrum antibiotics were started.  Additional pain medicine ordered.  I spoke with Dr. Harden who states that Dr. Genelle will be able to see in consult tomorrow.  Patient was admitted for further management. I ordered medication including Dilaudid  for analgesia, IV fluids for hydration, vancomycin  and cefepime  for the infection Reevaluation of  the patient after these medicines showed that the patient improved I have reviewed the patients home medicines and have made adjustments as needed   Consultations Obtained:  I requested consultation with the orthopedic doctors, Celena and Harden,  and discussed lab and imaging findings as well as pertinent plan - they recommend: Admission to medicine.  Dr. Genelle to see in consult tomorrow.   Social Determinants of Health:  Lives independently     Final diagnoses:  Acute pain of left knee    ED Discharge Orders     None          Melvenia Motto, MD 03/15/24 1646  "

## 2024-03-15 NOTE — ED Notes (Signed)
 PT provided with peanut butter crackers, water. Lights turned down, call bell in reach. Antibiotics infusing. Denies any other needs at this time.

## 2024-03-15 NOTE — ED Triage Notes (Signed)
 PT arrives via POV. Pt reports he has a known staph infection in his left knee. PT states since last night, he has had increased swelling and puss draining from his knee. PT denies fevers. He is AxOx4.

## 2024-03-15 NOTE — Progress Notes (Signed)
 Pharmacy Antibiotic Note  Paul [name]. is a 79 y.o. male for which pharmacy has been consulted for cefepime  and vancomycin  dosing for septic joint.  Patient with a history of HTN, HLD, GERD, AF, CAD, COPD, hx of DVT, BPH. Patient presenting with knee pain and swelling.  SCr 0.87 WBC 10.9; LA 1.5; T 97.9; HR 76; RR 19  Plan: Cefepime  2g q8hr  Vancomycin  1750 mg once then 1000 mg q12hr (eAUC 467.6) unless change in renal function Monitor WBC, fever, renal function, cultures De-escalate when able Levels at steady state  Height: 6' (182.9 cm) Weight: 88.5 kg (195 lb) IBW/kg (Calculated) : 77.6  Temp (24hrs), Avg:97.8 F (36.6 C), Min:97.7 F (36.5 C), Max:97.9 F (36.6 C)  Recent Labs  Lab 03/15/24 1230 03/15/24 1236 03/15/24 1503  WBC 10.9*  --   --   CREATININE 0.87  --   --   LATICACIDVEN  --  2.1* 1.5    Estimated Creatinine Clearance: 75.6 mL/min (by C-G formula based on SCr of 0.87 mg/dL).    Allergies[1]  Microbiology results: Pending  Thank you for allowing pharmacy to be a part of this patients care.  Dorn Buttner, PharmD, BCPS 03/15/2024 5:57 PM ED Clinical Pharmacist -  [phone]       [1]  Allergies Allergen Reactions   Other Itching    PATCHES. Patient states that any patch on his skin causes him to itch

## 2024-03-16 ENCOUNTER — Inpatient Hospital Stay (HOSPITAL_COMMUNITY)

## 2024-03-16 DIAGNOSIS — M7042 Prepatellar bursitis, left knee: Secondary | ICD-10-CM | POA: Diagnosis not present

## 2024-03-16 DIAGNOSIS — I480 Paroxysmal atrial fibrillation: Secondary | ICD-10-CM | POA: Diagnosis not present

## 2024-03-16 DIAGNOSIS — M00862 Arthritis due to other bacteria, left knee: Secondary | ICD-10-CM | POA: Diagnosis not present

## 2024-03-16 DIAGNOSIS — M25562 Pain in left knee: Secondary | ICD-10-CM

## 2024-03-16 DIAGNOSIS — I159 Secondary hypertension, unspecified: Secondary | ICD-10-CM | POA: Diagnosis not present

## 2024-03-16 DIAGNOSIS — E782 Mixed hyperlipidemia: Secondary | ICD-10-CM | POA: Diagnosis not present

## 2024-03-16 DIAGNOSIS — S76111A Strain of right quadriceps muscle, fascia and tendon, initial encounter: Secondary | ICD-10-CM | POA: Diagnosis not present

## 2024-03-16 LAB — CBC
HCT: 44.3 % (ref 39.0–52.0)
Hemoglobin: 15.1 g/dL (ref 13.0–17.0)
MCH: 31.7 pg (ref 26.0–34.0)
MCHC: 34.1 g/dL (ref 30.0–36.0)
MCV: 93.1 fL (ref 80.0–100.0)
Platelets: 254 K/uL (ref 150–400)
RBC: 4.76 MIL/uL (ref 4.22–5.81)
RDW: 13.3 % (ref 11.5–15.5)
WBC: 8.2 K/uL (ref 4.0–10.5)
nRBC: 0 % (ref 0.0–0.2)

## 2024-03-16 LAB — COMPREHENSIVE METABOLIC PANEL WITH GFR
ALT: 14 U/L (ref 0–44)
AST: 36 U/L (ref 15–41)
Albumin: 3.7 g/dL (ref 3.5–5.0)
Alkaline Phosphatase: 46 U/L (ref 38–126)
Anion gap: 9 (ref 5–15)
BUN: 21 mg/dL (ref 8–23)
CO2: 24 mmol/L (ref 22–32)
Calcium: 8.5 mg/dL — ABNORMAL LOW (ref 8.9–10.3)
Chloride: 103 mmol/L (ref 98–111)
Creatinine, Ser: 0.82 mg/dL (ref 0.61–1.24)
GFR, Estimated: >60 mL/min
Glucose, Bld: 116 mg/dL — ABNORMAL HIGH (ref 70–99)
Potassium: 4.1 mmol/L (ref 3.5–5.1)
Sodium: 136 mmol/L (ref 135–145)
Total Bilirubin: 0.4 mg/dL (ref 0.0–1.2)
Total Protein: 5.9 g/dL — ABNORMAL LOW (ref 6.5–8.1)

## 2024-03-16 NOTE — Progress Notes (Signed)
 "          [name]                                                                              Paul [name], is a 79 y.o. male, DOB - 1945/09/27, FMW:[id number] Admit date - 03/15/2024    Outpatient Primary MD for the patient is [name], [name], MD  LOS - 1  days  Chief Complaint  Patient presents with   Knee Problem       Brief summary   Patient is a 79 year old male with hypertension, HLP, GERD, A-fib, CAD, COPD, history of DVT, BPH, low back pain presented with left knee swelling and pain.  Patient reported multiple issues with his left leg/knee this year.  Earlier in the year, had a knee surgery following an initial injury in February 2025 when he fell while walking his dog..  After the fall, was found to have bilateral quadriceps tendon ruptures, which was fixed with Dr. Beverley.  Subsequently, had infection of the knee and has undergone 3 separate washouts.  Most recently in July and that time he also had bacteremia.   He has been followed up by infectious disease and completed a course of antibiotics in August via PICC line.  Had been doing well. Followed up with Dr Genelle orthopedics on 02/28/2024 and there was concern for bursitis and was also some concern for right quad tear.  He was started on steroids and antibiotics.  He repeated this course but I continue worsening of his swelling and pain in his left knee.  Reports he noticed some pus draining from the left knee as well.   Assessment & Plan     Suspected septic joint of left knee joint (HCC),  - history of recurrent knee infection, status post washout x 3, most recently treated for septic arthritis and bacteremia in July 2025.  Completed course of antibiotics via PICC line in August - Seen by orthopedics on 02/28/2024 with concern for bursitis, started on doxycycline and steroids without improvement.  No reported pus draining from the wound.  -Status post joint aspiration by EDP, follow cultures.   -MRI  left knee showed complex tear involving the posterior horn and mid body region of the medial meniscus and macerated appearance.  Intact ligamentous structures - Continue IV vancomycin , cefepime    Hypertension - Stable, continue ARB   CAD - Continue ARB, statin   Hyperlipidemia - Continue statin   GERD - Continue home PPI   Paroxysmal atrial fibrillation - Not currently on rate control, rhythm control control, anticoagulation   Depression Anxiety - Continue Wellbutrin    BPH - Continue finasteride    History of DVT  - Noted, not on any anticoagulation   COPD - Currently  no wheezing.  Estimated body mass index is 26.45 kg/m as calculated from the following:   Height as of this encounter: 6' (1.829 m).   Weight as of this encounter: 88.5 kg.  Code Status: Full CODE STATUS DVT Prophylaxis:  SCDs Start: 03/15/24 1723   Level of Care: Level of care: Telemetry Family Communication: Updated patient' Disposition Plan:      Remains inpatient appropriate:  Procedures:    Consultants:   Orthopedics  Antimicrobials:   Anti-infectives (From admission, onward)    Start     Dose/Rate Route Frequency Ordered Stop   03/16/24 0600  vancomycin  (VANCOCIN ) IVPB 1000 mg/200 mL premix        1,000 mg 200 mL/hr over 60 Minutes Intravenous Every 12 hours 03/15/24 1922     03/16/24 0000  ceFEPIme  (MAXIPIME ) 2 g in sodium chloride  0.9 % 100 mL IVPB        2 g 200 mL/hr over 30 Minutes Intravenous Every 8 hours 03/15/24 1922     03/15/24 1600  vancomycin  (VANCOCIN ) IVPB 1000 mg/200 mL premix  Status:  Discontinued        1,000 mg 200 mL/hr over 60 Minutes Intravenous  Once 03/15/24 1554 03/15/24 1555   03/15/24 1600  ceFEPIme  (MAXIPIME ) 2 g in sodium chloride  0.9 % 100 mL IVPB        2 g 200 mL/hr over 30 Minutes Intravenous  Once 03/15/24 1554 03/15/24 1650   03/15/24 1600  vancomycin  (VANCOREADY) IVPB 1750 mg/350 mL        1,750 mg 175 mL/hr over 120 Minutes Intravenous   Once 03/15/24 1555 03/15/24 1859          Medications  atorvastatin   10 mg Oral Daily   buPROPion   150 mg Oral Daily   finasteride   5 mg Oral Daily   irbesartan   150 mg Oral Daily   lidocaine -EPINEPHrine   5 mL     pantoprazole   40 mg Oral Daily   sodium chloride  flush  3 mL Intravenous Q12H      Subjective:   Paul [name] was seen and examined today.  Uncomfortable due to left knee pain, swelling, redness, discharge.  No fevers. Patient denies dizziness, chest pain, shortness of breath, abdominal pain, N/V.   Objective:   Vitals:   03/16/24 0600 03/16/24 0700 03/16/24 0728 03/16/24 0900  BP: 123/71 110/79  120/85  Pulse: 63 71  69  Resp: 16 18  18   Temp:   97.8 F (36.6 C)   TempSrc:   Oral   SpO2: 98% 98%  98%  Weight:      Height:        Intake/Output Summary (Last 24 hours) at 03/16/2024 1054 Last data filed at 03/16/2024 0758 Gross per 24 hour  Intake 395.37 ml  Output 800 ml  Net -404.63 ml     Wt Readings from Last 3 Encounters:  03/15/24 88.5 kg  01/25/24 87 kg  12/25/23 86.8 kg     Exam General: Alert and oriented x 3, NAD Cardiovascular: S1 S2 auscultated,  RRR Respiratory: Clear to auscultation bilaterally, no wheezing, rales or rhonchi Gastrointestinal: Soft, nontender, nondistended, + bowel sounds Ext: no pedal edema bilaterally.  Left knee with edema, warmth, tenderness, swelling Neuro: No new deficits Psych: Normal affect     Data Reviewed:  I have personally reviewed following labs    CBC Lab Results  Component Value Date   WBC 8.2 03/16/2024   RBC 4.76 03/16/2024   HGB 15.1 03/16/2024   HCT 44.3 03/16/2024   MCV 93.1 03/16/2024   MCH 31.7 03/16/2024   PLT 254 03/16/2024   MCHC 34.1 03/16/2024   RDW 13.3 03/16/2024   LYMPHSABS 1.6 03/15/2024   MONOABS 1.1 (H) 03/15/2024   EOSABS 0.0 03/15/2024   BASOSABS 0.1 03/15/2024     Last metabolic panel Lab Results  Component Value Date   NA 136 03/16/2024  K 4.1  03/16/2024   CL 103 03/16/2024   CO2 24 03/16/2024   BUN 21 03/16/2024   CREATININE 0.82 03/16/2024   GLUCOSE 116 (H) 03/16/2024   GFRNONAA >60 03/16/2024   GFRAA  04/09/2010    >60        The eGFR has been calculated using the MDRD equation. This calculation has not been validated in all clinical situations. eGFR's persistently <60 mL/min signify possible Chronic Kidney Disease.   CALCIUM  8.5 (L) 03/16/2024   PHOS 2.4 (L) 05/03/2023   PROT 5.9 (L) 03/16/2024   ALBUMIN 3.7 03/16/2024   LABGLOB 2.0 05/28/2023   BILITOT 0.4 03/16/2024   ALKPHOS 46 03/16/2024   AST 36 03/16/2024   ALT 14 03/16/2024   ANIONGAP 9 03/16/2024    CBG (last 3)  Recent Labs    03/15/24 1655  GLUCAP 104*      Coagulation Profile: No results for input(s): INR, PROTIME in the last 168 hours.   Radiology Studies: I have personally reviewed the imaging studies  MR KNEE LEFT W WO CONTRAST Result Date: 03/15/2024 CLINICAL DATA:  Knee pain and swelling. History of prior knee surgeries. EXAM: MRI OF THE LEFT KNEE WITHOUT AND WITH CONTRAST TECHNIQUE: Multiplanar, multisequence MR imaging of the knee was performed before and after the administration of intravenous contrast. CONTRAST:  9mL GADAVIST  GADOBUTROL  1 MMOL/ML IV SOLN COMPARISON:  MRI 08/26/2023 FINDINGS: MENISCI Medial meniscus: Complex tear involving the posterior horn and mid body region with a macerated appearance. This is new since the prior MRI examination. Lateral meniscus:  Intact LIGAMENTS Cruciates:  Intact.  Mucoid degeneration of the ACL. Collaterals:  Intact. CARTILAGE Patellofemoral: Significant degenerative chondrosis/chondromalacia with areas of full-thickness cartilage loss. There is also moderate edema like signal changes in the patella. Medial: Severe and progressive medial compartment degenerative changes with full-thickness cartilage loss, joint space narrowing, marrow edema and subchondral cystic change. Lateral: Moderate to  advanced degenerative chondrosis progressive since prior study. Joint:  Small joint effusion and mild synovitis. Popliteal Fossa:  No popliteal mass.  Very small Baker's cyst. Extensor Mechanism: The patella retinacular structures are intact. There is marked thickening of the lateral retinaculum possibly related to prior surgical repair with fibrosis. Severe chronic tendinopathy involving the quadriceps tendon which has been surgically repaired. Partial-thickness tearing of the tendon medially. Bones: Moderate diffuse edema like signal changes involving the patella possibly reactive related to the quadriceps disease. Multiple small bone anchors are noted in the patella. Other: Mild subcutaneous inflammation/edema and enhancement. There is also moderate enhancement of the supra tele joint space medially likely synovitis. IMPRESSION: 1. Complex tear involving the posterior horn and mid body region of the medial meniscus with a macerated appearance. This is new since the prior MRI examination. 2. Intact ligamentous structures. 3. Significant and progressive tricompartmental degenerative changes most severe in the medial compartment. 4. Severe chronic tendinopathy involving the quadriceps tendon which has been surgically repaired. Partial-thickness tearing of the tendon medially. 5. Small joint effusion and mild synovitis. 6. Diffuse marrow edema involving the patella likely stress related process. Electronically Signed   By: MYRTIS Stammer M.D.   On: 03/15/2024 22:00   DG Knee 2 Views Left Result Date: 03/15/2024 CLINICAL DATA:  Knee pain, known staph infection of left knee. EXAM: LEFT KNEE - 1-2 VIEW COMPARISON:  08/12/2023. FINDINGS: No acute fracture or dislocation is seen. No bony erosions or periosteal elevation. There is a small suprapatellar joint effusion. Mild degenerative changes are noted in the medial  patellofemoral compartments. A dystrophic calcification is noted in the soft tissues, likely related to  prior trauma. Soft tissue swelling is present anterior to the knee. Vascular calcifications are noted in the soft tissues. IMPRESSION: 1. No acute osseous abnormality. 2. Soft tissue swelling anterior to the knee and small joint effusion. Electronically Signed   By: Leita Birmingham M.D.   On: 03/15/2024 15:39       Charlean Carneal M.D. [name] 03/16/2024, 10:54 AM  Available via Epic secure chat 7am-7pm After 7 pm, please refer to night coverage provider listed on amion.    "

## 2024-03-16 NOTE — ED Notes (Signed)
 Patient transported to MRI

## 2024-03-16 NOTE — Plan of Care (Signed)
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
## 2024-03-17 ENCOUNTER — Other Ambulatory Visit

## 2024-03-17 ENCOUNTER — Encounter (HOSPITAL_COMMUNITY): Payer: Self-pay | Admitting: Internal Medicine

## 2024-03-17 ENCOUNTER — Inpatient Hospital Stay: Admission: RE | Admit: 2024-03-17 | Source: Ambulatory Visit

## 2024-03-17 ENCOUNTER — Inpatient Hospital Stay (HOSPITAL_COMMUNITY): Admitting: Certified Registered"

## 2024-03-17 ENCOUNTER — Encounter (HOSPITAL_COMMUNITY)
Admission: EM | Disposition: A | Discharge status: Home / Self Care | Payer: Self-pay | Source: Home / Self Care | Attending: Internal Medicine

## 2024-03-17 ENCOUNTER — Inpatient Hospital Stay (HOSPITAL_COMMUNITY)

## 2024-03-17 DIAGNOSIS — E782 Mixed hyperlipidemia: Secondary | ICD-10-CM | POA: Diagnosis not present

## 2024-03-17 DIAGNOSIS — M00862 Arthritis due to other bacteria, left knee: Secondary | ICD-10-CM | POA: Diagnosis not present

## 2024-03-17 DIAGNOSIS — S76111A Strain of right quadriceps muscle, fascia and tendon, initial encounter: Secondary | ICD-10-CM | POA: Diagnosis not present

## 2024-03-17 DIAGNOSIS — I480 Paroxysmal atrial fibrillation: Secondary | ICD-10-CM

## 2024-03-17 DIAGNOSIS — M25562 Pain in left knee: Secondary | ICD-10-CM | POA: Diagnosis not present

## 2024-03-17 DIAGNOSIS — M7042 Prepatellar bursitis, left knee: Secondary | ICD-10-CM

## 2024-03-17 HISTORY — PX: REPAIR QUADRICEPS/HAMSTRING MUSCLES: SHX6563

## 2024-03-17 HISTORY — PX: IRRIGATION AND DEBRIDEMENT KNEE: SHX5185

## 2024-03-17 LAB — GLUCOSE, BODY FLUID OTHER: Glucose, Body Fluid Other: 48 mg/dL

## 2024-03-17 MED ORDER — FENTANYL CITRATE (PF) 100 MCG/2ML IJ SOLN
INTRAMUSCULAR | Status: AC
  Filled 2024-03-17: qty 2

## 2024-03-17 MED ORDER — LIDOCAINE 2% (20 MG/ML) 5 ML SYRINGE
INTRAMUSCULAR | Status: DC | PRN
  Administered 2024-03-17: 25 mg via INTRAVENOUS

## 2024-03-17 MED ORDER — 0.9 % SODIUM CHLORIDE (POUR BTL) OPTIME
TOPICAL | Status: DC | PRN
  Administered 2024-03-17: 1000 mL

## 2024-03-17 MED ORDER — HYDROMORPHONE HCL 1 MG/ML IJ SOLN
INTRAMUSCULAR | Status: AC
  Filled 2024-03-17: qty 1

## 2024-03-17 MED ORDER — FENTANYL CITRATE (PF) 100 MCG/2ML IJ SOLN
25.0000 ug | INTRAMUSCULAR | Status: DC | PRN
  Administered 2024-03-17 (×3): 50 ug via INTRAVENOUS

## 2024-03-17 MED ORDER — METHOCARBAMOL 1000 MG/10ML IJ SOLN
500.0000 mg | Freq: Once | INTRAMUSCULAR | Status: AC
  Administered 2024-03-17: 500 mg via INTRAVENOUS

## 2024-03-17 MED ORDER — ORAL CARE MOUTH RINSE: 15.0000 mL | Freq: Once | OROMUCOSAL | Status: AC

## 2024-03-17 MED ORDER — ASPIRIN 325 MG PO TABS
325.0000 mg | ORAL_TABLET | Freq: Every day | ORAL | Status: DC
  Administered 2024-03-17 – 2024-03-20 (×4): 325 mg via ORAL
  Filled 2024-03-17 (×5): qty 1

## 2024-03-17 MED ORDER — METHOCARBAMOL 1000 MG/10ML IJ SOLN
500.0000 mg | Freq: Once | INTRAMUSCULAR | Status: AC
  Administered 2024-03-17: 500 mg via INTRAVENOUS
  Filled 2024-03-17: qty 10

## 2024-03-17 MED ORDER — CEFAZOLIN SODIUM-DEXTROSE 2-4 GM/100ML-% IV SOLN
2.0000 g | Freq: Three times a day (TID) | INTRAVENOUS | Status: DC
  Administered 2024-03-17 – 2024-03-20 (×9): 2 g via INTRAVENOUS
  Filled 2024-03-17 (×10): qty 100

## 2024-03-17 MED ORDER — CHLORHEXIDINE GLUCONATE 0.12 % MT SOLN
15.0000 mL | Freq: Once | OROMUCOSAL | Status: AC
  Administered 2024-03-17: 15 mL via OROMUCOSAL

## 2024-03-17 MED ORDER — PROPOFOL 10 MG/ML IV BOLUS
INTRAVENOUS | Status: AC
  Filled 2024-03-17: qty 20

## 2024-03-17 MED ORDER — VANCOMYCIN HCL 1000 MG IV SOLR
INTRAVENOUS | Status: AC
  Filled 2024-03-17: qty 20

## 2024-03-17 MED ORDER — CEFAZOLIN SODIUM-DEXTROSE 2-4 GM/100ML-% IV SOLN: 2.0000 g | Freq: Three times a day (TID) | INTRAVENOUS | Status: DC

## 2024-03-17 MED ORDER — ROCURONIUM BROMIDE 10 MG/ML (PF) SYRINGE
PREFILLED_SYRINGE | INTRAVENOUS | Status: AC
  Filled 2024-03-17: qty 10

## 2024-03-17 MED ORDER — PHENYLEPHRINE HCL-NACL 20-0.9 MG/250ML-% IV SOLN
INTRAVENOUS | Status: DC | PRN
  Administered 2024-03-17: 30 ug/min via INTRAVENOUS

## 2024-03-17 MED ORDER — BUPIVACAINE HCL (PF) 0.25 % IJ SOLN
INTRAMUSCULAR | Status: AC
  Filled 2024-03-17: qty 30

## 2024-03-17 MED ORDER — ACETAMINOPHEN 10 MG/ML IV SOLN
1000.0000 mg | Freq: Once | INTRAVENOUS | Status: AC
  Administered 2024-03-17: 1000 mg via INTRAVENOUS
  Filled 2024-03-17: qty 100

## 2024-03-17 MED ORDER — BISACODYL 10 MG RE SUPP: 10.0000 mg | Freq: Every day | RECTAL | Status: DC | PRN

## 2024-03-17 MED ORDER — EPHEDRINE SULFATE-NACL 50-0.9 MG/10ML-% IV SOSY
PREFILLED_SYRINGE | INTRAVENOUS | Status: DC | PRN
  Administered 2024-03-17: 7.5 mg via INTRAVENOUS

## 2024-03-17 MED ORDER — BUPIVACAINE HCL (PF) 0.5 % IJ SOLN
INTRAMUSCULAR | Status: AC
  Filled 2024-03-17: qty 30

## 2024-03-17 MED ORDER — ONDANSETRON HCL 4 MG/2ML IJ SOLN
INTRAMUSCULAR | Status: DC | PRN
  Administered 2024-03-17: 4 mg via INTRAVENOUS

## 2024-03-17 MED ORDER — ONDANSETRON HCL 4 MG/2ML IJ SOLN
INTRAMUSCULAR | Status: AC
  Filled 2024-03-17: qty 2

## 2024-03-17 MED ORDER — HYDROMORPHONE HCL 1 MG/ML IJ SOLN
0.2500 mg | INTRAMUSCULAR | Status: AC | PRN
  Administered 2024-03-17 (×4): 0.5 mg via INTRAVENOUS

## 2024-03-17 MED ORDER — SENNOSIDES-DOCUSATE SODIUM 8.6-50 MG PO TABS: 1.0000 | ORAL_TABLET | Freq: Two times a day (BID) | ORAL | Status: DC

## 2024-03-17 MED ORDER — SUGAMMADEX SODIUM 200 MG/2ML IV SOLN
INTRAVENOUS | Status: DC | PRN
  Administered 2024-03-17: 354 mg via INTRAVENOUS

## 2024-03-17 MED ORDER — DEXAMETHASONE SOD PHOSPHATE PF 10 MG/ML IJ SOLN
INTRAMUSCULAR | Status: DC | PRN
  Administered 2024-03-17: 10 mg via INTRAVENOUS

## 2024-03-17 MED ORDER — LACTATED RINGERS IV SOLN: INTRAVENOUS | Status: DC

## 2024-03-17 MED ORDER — ACETAMINOPHEN 10 MG/ML IV SOLN
INTRAVENOUS | Status: AC
  Filled 2024-03-17: qty 100

## 2024-03-17 MED ORDER — ROCURONIUM BROMIDE 10 MG/ML (PF) SYRINGE
PREFILLED_SYRINGE | INTRAVENOUS | Status: DC | PRN
  Administered 2024-03-17: 50 mg via INTRAVENOUS
  Administered 2024-03-17: 20 mg via INTRAVENOUS
  Administered 2024-03-17: 30 mg via INTRAVENOUS

## 2024-03-17 MED ORDER — VANCOMYCIN HCL 1000 MG IV SOLR
INTRAVENOUS | Status: DC | PRN
  Administered 2024-03-17 (×2): 1000 mg via TOPICAL

## 2024-03-17 MED ORDER — METHOCARBAMOL 1000 MG/10ML IJ SOLN
INTRAMUSCULAR | Status: AC
  Filled 2024-03-17: qty 10

## 2024-03-17 MED ORDER — BUPIVACAINE HCL (PF) 0.25 % IJ SOLN: INTRAMUSCULAR | Status: DC | PRN

## 2024-03-17 MED ORDER — SODIUM CHLORIDE 0.9 % IR SOLN
Status: DC | PRN
  Administered 2024-03-17: 3000 mL

## 2024-03-17 MED ORDER — PROPOFOL 10 MG/ML IV BOLUS
INTRAVENOUS | Status: DC | PRN
  Administered 2024-03-17: 130 mg via INTRAVENOUS

## 2024-03-17 MED ORDER — DEXAMETHASONE SOD PHOSPHATE PF 10 MG/ML IJ SOLN
INTRAMUSCULAR | Status: AC
  Filled 2024-03-17: qty 1

## 2024-03-17 MED ORDER — FENTANYL CITRATE (PF) 100 MCG/2ML IJ SOLN
INTRAMUSCULAR | Status: DC | PRN
  Administered 2024-03-17 (×4): 50 ug via INTRAVENOUS

## 2024-03-17 MED ORDER — LIDOCAINE 2% (20 MG/ML) 5 ML SYRINGE
INTRAMUSCULAR | Status: AC
  Filled 2024-03-17: qty 5

## 2024-03-17 MED ORDER — SENNOSIDES-DOCUSATE SODIUM 8.6-50 MG PO TABS
2.0000 | ORAL_TABLET | Freq: Every day | ORAL | Status: DC
  Administered 2024-03-17 – 2024-03-19 (×3): 2 via ORAL
  Filled 2024-03-17 (×3): qty 2

## 2024-03-17 NOTE — Transfer of Care (Signed)
 Immediate Anesthesia Transfer of Care Note  Patient: Paul [name].  Procedure(s) Performed: REPAIR, MUSCLE, QUADRICEPS OR HAMSTRING (Right: Knee) IRRIGATION AND DEBRIDEMENT KNEE (Left: Knee)  Patient Location: PACU  Anesthesia Type:General  Level of Consciousness: awake, alert , and oriented  Airway & Oxygen Therapy: Patient Spontanous Breathing and Patient connected to face mask oxygen  Post-op Assessment: Report given to RN and Post -op Vital signs reviewed and stable  Post vital signs: Reviewed and stable  Last Vitals:  Vitals Value Taken Time  BP 143/73 03/17/24 18:30  Temp 36.2 C 03/17/24 18:24  Pulse 76 03/17/24 18:37  Resp 12 03/17/24 18:37  SpO2 91 % 03/17/24 18:37  Vitals shown include unfiled device data.  Last Pain:  Vitals:   03/17/24 1824  TempSrc:   PainSc: 0-No pain         Complications: No notable events documented.

## 2024-03-17 NOTE — Plan of Care (Signed)

## 2024-03-17 NOTE — Interval H&P Note (Signed)
 History and Physical Interval Note:  03/17/2024 1:53 PM  Paul [name].  has presented today for surgery, with the diagnosis of Torn Right Quadricep , left knee infection.  The various methods of treatment have been discussed with the patient and family. After consideration of risks, benefits and other options for treatment, the patient has consented to  Procedures with comments: REPAIR, MUSCLE, QUADRICEPS OR HAMSTRING (Right) IRRIGATION AND DEBRIDEMENT KNEE (Left) - WITH POSSIBLE HARDWARE REMOVAL as a surgical intervention.  The patient's history has been reviewed, patient examined, no change in status, stable for surgery.  I have reviewed the patient's chart and labs.  Questions were answered to the patient's satisfaction.     [name]

## 2024-03-17 NOTE — Anesthesia Preprocedure Evaluation (Signed)
"                                    Anesthesia Evaluation  Patient identified by MRN, date of birth, ID band Patient awake    Reviewed: Allergy & Precautions, NPO status , Patient's Chart, lab work & pertinent test results  Airway Mallampati: I  TM Distance: >3 FB Neck ROM: Full    Dental  (+) Teeth Intact, Dental Advisory Given   Pulmonary asthma , COPD, former smoker   breath sounds clear to auscultation       Cardiovascular hypertension, Pt. on medications + CAD   Rhythm:Regular Rate:Normal     Neuro/Psych  Headaches PSYCHIATRIC DISORDERS Anxiety Depression     Neuromuscular disease    GI/Hepatic ,GERD  Medicated,,(+)     substance abuse  , Hepatitis -, C  Endo/Other  negative endocrine ROS    Renal/GU Renal disease     Musculoskeletal  (+) Arthritis ,    Abdominal   Peds  Hematology negative hematology ROS (+)   Anesthesia Other Findings   Reproductive/Obstetrics                              Anesthesia Physical Anesthesia Plan  ASA: 2  Anesthesia Plan: General   Post-op Pain Management: Tylenol  PO (pre-op)*   Induction: Intravenous  PONV Risk Score and Plan: 3 and Dexamethasone , Midazolam and Ondansetron   Airway Management Planned: Oral ETT  Additional Equipment: None  Intra-op Plan:   Post-operative Plan: Extubation in OR  Informed Consent: I have reviewed the patients History and Physical, chart, labs and discussed the procedure including the risks, benefits and alternatives for the proposed anesthesia with the patient or authorized representative who has indicated his/her understanding and acceptance.     Dental advisory given  Plan Discussed with: CRNA  Anesthesia Plan Comments:         Anesthesia Quick Evaluation  "

## 2024-03-17 NOTE — Brief Op Note (Signed)
" ° °  Brief Op Note  Date of Surgery: 03/17/2024  Preoperative Diagnosis: Torn Right Quadricep , left knee infection  Postoperative Diagnosis: same  Procedure: Procedures: REPAIR, MUSCLE, QUADRICEPS OR HAMSTRING IRRIGATION AND DEBRIDEMENT KNEE  Implants: Implant Name Type Inv. Item Serial No. Manufacturer Lot No. LRB No. Used Action  ANCHOR KNTLS VENTIX 4.75 - [id number] Anchor ANCHOR KNTLS VENTIX 4.75  [employer](ORTH,TRAU,BIO,SG) [id number] Right 1 Implanted  ANCHOR KNTLS VENTIX 4.75 - [id number] Anchor ANCHOR KNTLS VENTIX 4.75  [employer]ON(ORTH,TRAU,BIO,SG) [id number] Right 1 Implanted  GRAFT ACHILLES CALC BNE BLCK - [id number] Bone Implant GRAFT ACHILLES CALC BNE [employer] [id number] [facility]  Right 1 Implanted  IMPL TAPESTRY BIOINTEGR 50X40 - [id number] Mesh General IMPL TAPESTRY BIOINTEGR 50X40  [employer]IMMER [employer]ON(ORTH,TRAU,BIO,SG) [id number] Right 1 Implanted  IMPL TAPESTRY BIOINTEGR 50X40 - [id number] Mesh General IMPL TAPESTRY ANNABELL 50X40  [employer]IMMER [employer]ON(ORTH,TRAU,BIO,SG) [id number] Right 1 Implanted    Surgeons: Surgeon(s): Genelle Standing, MD  Anesthesia: General    Estimated Blood Loss: See anesthesia record  Complications: None  Condition to PACU: Stable  Standing LITTIE Genelle, MD 03/17/2024 6:10 PM  "

## 2024-03-17 NOTE — Plan of Care (Signed)
 Patient at surgery and couldn't be seen  Recurrent left knee articular/periarticular infection (mssa in recent past) in setting prior fiberwire suture use to anchor the quadricep tendon into the patella   Patient getting I&D currently   This will be staph aureus again    -can continue cefazolin  after surgery and await culture

## 2024-03-17 NOTE — Progress Notes (Signed)
 "          Paul [name]                                                                              Paul [name], is a 79 y.o. male, DOB - 09/25/1945, FMW:[id number] Admit date - 03/15/2024    Outpatient Primary Paul [name] for the patient is Paul [name], Paul GAILS, Paul [name]  LOS - 2  days  Chief Complaint  Patient presents with   Knee Problem       Brief summary   Patient is a 79 year old male with hypertension, HLP, GERD, A-fib, CAD, COPD, history of DVT, BPH, low back pain presented with left knee swelling and pain.  Patient reported multiple issues with his left leg/knee this year.  Earlier in the year, had a knee surgery following an initial injury in February 2025 when he fell while walking his dog..  After the fall, was found to have bilateral quadriceps tendon ruptures, which was fixed with Dr. Beverley.  Subsequently, had infection of the knee and has undergone 3 separate washouts.  Most recently in July and that time he also had bacteremia.   He has been followed up by infectious disease and completed a course of antibiotics in August via PICC line.  Had been doing well. Followed up with Dr Genelle orthopedics on 02/28/2024 and there was concern for bursitis and was also some concern for right quad tear.  He was started on steroids and antibiotics.  He repeated this course but I continue worsening of his swelling and pain in his left knee.  Reports he noticed some pus draining from the left knee as well.   Assessment & Plan    Suspected septic joint of left knee joint (HCC),  - history of recurrent knee infection, status post washout x 3, most recently treated for septic arthritis and bacteremia in July 2025.  Completed course of antibiotics via PICC line in August -MRI left knee showed complex tear involving the posterior horn and mid body region of the medial meniscus and macerated appearance.  Intact ligamentous structures - Ortho consulted, plan for right knee revision quadriceps tendon  repair, left knee I&D with removal of hardware today, n.p.o.   - Continue IV vancomycin , cefepime , ID consulted.  Hypertension - Stable, continue irbesartan    CAD - Continue ARB, statin   Hyperlipidemia - Continue statin   GERD - Continue home PPI   Paroxysmal atrial fibrillation - Not currently on any rate controlling meds or anticoagulation.   Depression Anxiety - Continue Wellbutrin    BPH - Continue finasteride    History of DVT  - Noted, not on any anticoagulation   COPD - Currently  no wheezing.  Constipation: Placed on bowel regimen  Estimated body mass index is 26.45 kg/m as calculated from the following:   Height as of this encounter: 6' (1.829 m).   Weight as of this encounter: 88.5 kg.  Code Status: Full CODE STATUS DVT Prophylaxis:  SCDs Start: 03/15/24 1723   Level of Care: Level of care: Telemetry Family Communication: Updated patient' Disposition Plan:      Remains inpatient appropriate:   OR today  Procedures:    Consultants:  Orthopedics Infectious disease  Antimicrobials:   Anti-infectives (From admission, onward)    Start     Dose/Rate Route Frequency Ordered Stop   03/16/24 0600  vancomycin  (VANCOCIN ) IVPB 1000 mg/200 mL premix        1,000 mg 200 mL/hr over 60 Minutes Intravenous Every 12 hours 03/15/24 1922     03/16/24 0000  ceFEPIme  (MAXIPIME ) 2 g in sodium chloride  0.9 % 100 mL IVPB        2 g 200 mL/hr over 30 Minutes Intravenous Every 8 hours 03/15/24 1922     03/15/24 1600  vancomycin  (VANCOCIN ) IVPB 1000 mg/200 mL premix  Status:  Discontinued        1,000 mg 200 mL/hr over 60 Minutes Intravenous  Once 03/15/24 1554 03/15/24 1555   03/15/24 1600  ceFEPIme  (MAXIPIME ) 2 g in sodium chloride  0.9 % 100 mL IVPB        2 g 200 mL/hr over 30 Minutes Intravenous  Once 03/15/24 1554 03/15/24 1650   03/15/24 1600  vancomycin  (VANCOREADY) IVPB 1750 mg/350 mL        1,750 mg 175 mL/hr over 120 Minutes Intravenous  Once 03/15/24  1555 03/15/24 1859          Medications  atorvastatin   10 mg Oral Daily   buPROPion   150 mg Oral Daily   finasteride   5 mg Oral Daily   irbesartan   150 mg Oral Daily   lidocaine -EPINEPHrine   5 mL     pantoprazole   40 mg Oral Daily   senna-docusate  2 tablet Oral QHS   sodium chloride  flush  3 mL Intravenous Q12H      Subjective:   Paul [name] was seen and examined today.  Surgery planned today, no acute complaints.  + Left knee pain  Objective:   Vitals:   03/16/24 1426 03/16/24 2020 03/17/24 0518 03/17/24 0720  BP: 130/76 120/74 120/77 128/82  Pulse: 66 69 68 74  Resp: 18 19 17 16   Temp: (!) 97.5 F (36.4 C) 98 F (36.7 C) 97.8 F (36.6 C) (!) 97.2 F (36.2 C)  TempSrc: Oral Oral Oral Oral  SpO2: 98% 98% 96% 98%  Weight:      Height:        Intake/Output Summary (Last 24 hours) at 03/17/2024 1249 Last data filed at 03/17/2024 0610 Gross per 24 hour  Intake 824.25 ml  Output --  Net 824.25 ml     Wt Readings from Last 3 Encounters:  03/15/24 88.5 kg  01/25/24 87 kg  12/25/23 86.8 kg   Physical Exam General: Alert and oriented x 3, NAD Cardiovascular: S1 S2 clear, RRR.  Respiratory: CTAB, no wheezing Gastrointestinal: Soft, nontender, nondistended, NBS Ext: no pedal edema bilaterally Neuro: no new deficits Skin: Left knee with warmth, tenderness and swelling Psych: Normal affect     Data Reviewed:  I have personally reviewed following labs    CBC Lab Results  Component Value Date   WBC 8.2 03/16/2024   RBC 4.76 03/16/2024   HGB 15.1 03/16/2024   HCT 44.3 03/16/2024   MCV 93.1 03/16/2024   MCH 31.7 03/16/2024   PLT 254 03/16/2024   MCHC 34.1 03/16/2024   RDW 13.3 03/16/2024   LYMPHSABS 1.6 03/15/2024   MONOABS 1.1 (H) 03/15/2024   EOSABS 0.0 03/15/2024   BASOSABS 0.1 03/15/2024     Last metabolic panel Lab Results  Component Value Date   NA 136 03/16/2024   K 4.1 03/16/2024   CL 103  03/16/2024   CO2 24 03/16/2024   BUN 21  03/16/2024   CREATININE 0.82 03/16/2024   GLUCOSE 116 (H) 03/16/2024   GFRNONAA >60 03/16/2024   GFRAA  04/09/2010    >60        The eGFR has been calculated using the MDRD equation. This calculation has not been validated in all clinical situations. eGFR's persistently <60 mL/min signify possible Chronic Kidney Disease.   CALCIUM  8.5 (L) 03/16/2024   PHOS 2.4 (L) 05/03/2023   PROT 5.9 (L) 03/16/2024   ALBUMIN 3.7 03/16/2024   LABGLOB 2.0 05/28/2023   BILITOT 0.4 03/16/2024   ALKPHOS 46 03/16/2024   AST 36 03/16/2024   ALT 14 03/16/2024   ANIONGAP 9 03/16/2024    CBG (last 3)  Recent Labs    03/15/24 1655  GLUCAP 104*      Coagulation Profile: No results for input(s): INR, PROTIME in the last 168 hours.   Radiology Studies: I have personally reviewed the imaging studies  MR KNEE RIGHT WO CONTRAST Result Date: 03/16/2024 CLINICAL DATA:  Pain. Concern for quadriceps re-tear. 10 months status post repair of bilateral quadriceps ruptures. EXAM: MRI OF THE RIGHT KNEE WITHOUT CONTRAST TECHNIQUE: Multiplanar, multisequence MR imaging of the knee was performed. No intravenous contrast was administered. COMPARISON:  MRI right knee dated 04/29/2023. FINDINGS: MENISCI Medial: Redemonstrated complex tear of the body and posterior horn of the medial meniscus with extrusion of the body into the medial gutter. Similar 5 mm low signal structure within the medial gutter along the undersurface of the body of the medial meniscus likely represents a displaced meniscal fragment. Lateral: Horizontal tear of the body and posterior horn of the lateral meniscus with degeneration. LIGAMENTS Cruciates: ACL and PCL are intact. Collaterals: Medial collateral ligament is intact. Lateral collateral ligament complex is intact. CARTILAGE Patellofemoral: Moderate partial-thickness cartilage loss of the medial patellar facet. Medial: Mild partial-thickness cartilage loss of the medial femorotibial  compartment. Lateral: Mild partial-thickness cartilage loss of the lateral femorotibial compartment. JOINT: Trace joint effusion. Normal Hoffa's fat-pad. No plical thickening. POPLITEAL FOSSA: Popliteus tendon is intact. Tiny Baker's cyst. EXTENSOR MECHANISM: Postoperative changes related to interval repair of distal quadriceps tendon rupture with evidence of complete re-tear of the distal quadriceps tendon from the insertion on the superior pole of the patella with approximately 2.8 cm of tendon retraction. Intact patellar tendon. Intact lateral patellar retinaculum. Intact medial patellar retinaculum. BONES: No fracture or dislocation. Similar mild lateral shift of the tibia relative to the femur. No aggressive osseous lesion. Other: No fluid collection or hematoma. IMPRESSION: 1. Complete re-tear of the previously repaired right distal quadriceps tendon from the insertion on the superior patellar pole with approximately 2.8 cm of tendon retraction. 2. Redemonstrated complex tear of the body and posterior horn of the medial meniscus with extrusion of the body into the medial gutter. Similar suspected 5 mm displaced meniscal fragment within the medial gutter along the undersurface of the body of the medial meniscus. 3. Horizontal tear of the body and posterior horn of the lateral meniscus with degeneration. 4. Tricompartmental osteoarthritis with cartilage abnormalities, as described above. 5. Similar mild lateral shift of the tibia relative to the femur. 6. Trace knee joint effusion. Electronically Signed   By: Harrietta Sherry M.D.   On: 03/16/2024 14:51   MR KNEE LEFT W WO CONTRAST Result Date: 03/15/2024 CLINICAL DATA:  Knee pain and swelling. History of prior knee surgeries. EXAM: MRI OF THE LEFT KNEE WITHOUT AND WITH CONTRAST TECHNIQUE:  Multiplanar, multisequence MR imaging of the knee was performed before and after the administration of intravenous contrast. CONTRAST:  9mL GADAVIST  GADOBUTROL  1 MMOL/ML IV  SOLN COMPARISON:  MRI 08/26/2023 FINDINGS: MENISCI Medial meniscus: Complex tear involving the posterior horn and mid body region with a macerated appearance. This is new since the prior MRI examination. Lateral meniscus:  Intact LIGAMENTS Cruciates:  Intact.  Mucoid degeneration of the ACL. Collaterals:  Intact. CARTILAGE Patellofemoral: Significant degenerative chondrosis/chondromalacia with areas of full-thickness cartilage loss. There is also moderate edema like signal changes in the patella. Medial: Severe and progressive medial compartment degenerative changes with full-thickness cartilage loss, joint space narrowing, marrow edema and subchondral cystic change. Lateral: Moderate to advanced degenerative chondrosis progressive since prior study. Joint:  Small joint effusion and mild synovitis. Popliteal Fossa:  No popliteal mass.  Very small Baker's cyst. Extensor Mechanism: The patella retinacular structures are intact. There is marked thickening of the lateral retinaculum possibly related to prior surgical repair with fibrosis. Severe chronic tendinopathy involving the quadriceps tendon which has been surgically repaired. Partial-thickness tearing of the tendon medially. Bones: Moderate diffuse edema like signal changes involving the patella possibly reactive related to the quadriceps disease. Multiple small bone anchors are noted in the patella. Other: Mild subcutaneous inflammation/edema and enhancement. There is also moderate enhancement of the supra tele joint space medially likely synovitis. IMPRESSION: 1. Complex tear involving the posterior horn and mid body region of the medial meniscus with a macerated appearance. This is new since the prior MRI examination. 2. Intact ligamentous structures. 3. Significant and progressive tricompartmental degenerative changes most severe in the medial compartment. 4. Severe chronic tendinopathy involving the quadriceps tendon which has been surgically repaired.  Partial-thickness tearing of the tendon medially. 5. Small joint effusion and mild synovitis. 6. Diffuse marrow edema involving the patella likely stress related process. Electronically Signed   By: MYRTIS Stammer M.D.   On: 03/15/2024 22:00   DG Knee 2 Views Left Result Date: 03/15/2024 CLINICAL DATA:  Knee pain, known staph infection of left knee. EXAM: LEFT KNEE - 1-2 VIEW COMPARISON:  08/12/2023. FINDINGS: No acute fracture or dislocation is seen. No bony erosions or periosteal elevation. There is a small suprapatellar joint effusion. Mild degenerative changes are noted in the medial patellofemoral compartments. A dystrophic calcification is noted in the soft tissues, likely related to prior trauma. Soft tissue swelling is present anterior to the knee. Vascular calcifications are noted in the soft tissues. IMPRESSION: 1. No acute osseous abnormality. 2. Soft tissue swelling anterior to the knee and small joint effusion. Electronically Signed   By: Leita Birmingham M.D.   On: 03/15/2024 15:39       Paul [name] M.D. Paul [name] 03/17/2024, 12:49 PM  Available via Epic secure chat 7am-7pm After 7 pm, please refer to night coverage provider listed on amion.    "

## 2024-03-17 NOTE — Op Note (Signed)
 "  Date of Surgery: 03/17/2024  INDICATIONS: [name] is a 79 y.o.-year-old male with right quadriceps tendon rerupture with left infected prepatellar bursitis.  The risk and benefits of the procedure were discussed in detail and documented in the pre-operative evaluation.   PREOPERATIVE DIAGNOSIS: 1.  Right knee quadriceps tendon rupture 2.  Left knee infected prepatellar bursitis  POSTOPERATIVE DIAGNOSIS: Same.  PROCEDURE: 1.  Right knee revision quadriceps repair with allograft Achilles 2.  Left knee irrigation debridement with bursectomy 3.  Left knee removal of deep hardware  SURGEON: [name] MD  ASSISTANT: Conley Dawson, ATC  ANESTHESIA:  general  IV FLUIDS AND URINE: See anesthesia record.  ANTIBIOTICS: Ancef   ESTIMATED BLOOD LOSS: 25 mL.  IMPLANTS:  Implant Name Type Inv. Item Serial No. Manufacturer Lot No. LRB No. Used Action  ANCHOR KNTLS VENTIX 4.75 - [id number] Anchor ANCHOR KNTLS VENTIX 4.75  [employer](ORTH,TRAU,BIO,SG) [id number] Right 1 Implanted  ANCHOR KNTLS VENTIX 4.75 - [id number] Anchor ANCHOR KNTLS VENTIX 4.75  [employer](ORTH,TRAU,BIO,SG) [id number] Right 1 Implanted  GRAFT ACHILLES CALC BNE BLCK - [id number] Bone Implant GRAFT ACHILLES CALC BNE BLCK [id number] [facility]  Right 1 Implanted  IMPL TAPESTRY BIOINTEGR 50X40 - [id number] Mesh General IMPL TAPESTRY BIOINTEGR 50X40  [employer](ORTH,TRAU,BIO,SG) [id number] Right 1 Implanted  IMPL TAPESTRY BIOINTEGR 50X40 - [id number] Mesh General IMPL TAPESTRY BIOINTEGR 50X40  [employer](ORTH,TRAU,BIO,SG) [id number] Right 1 Implanted    DRAINS: None  CULTURES: None  COMPLICATIONS: none  DESCRIPTION OF PROCEDURE:    I identified the patient in the pre-operative holding area.  The patient was transferred to the operative suite and placed in the supine position with all bony prominences padded.     SCDs were placed on the non-operative lower extremity. The operative extremity was then  prepped and draped in standard fashion. A time out was performed confirming the correct extremity, correct patient and correct procedure.  Began with the right side.  A 10cm anterior knee incision was made. This was taken sharply down through subcutaneous tissue to the retinaculum. The retinaculum was torn medially and laterally. Hematoma was evacuated. The patella was exposed.  There was no tissue distance inserting on the patella with a very large 3 cm gap to residual quadriceps tendon.  As result the decision was made to augment with an allograft.  A folded allograft was placed on the distal stump of the native quad tendon.  This was sutured into place with 0 Vicryl.  At this time the usual Bobetta was performed up and down with active braid suture.  There were 4 limbs exiting both the quad/allograft construct.  At this time these were placed into the patella with 2 Ventralex anchors.  I was extremely happy with the purchase.  These were then brought up through the tissue to create an additional mattress style repair for both of the anchors.  Rounding retinaculum was closed with 0 Vicryl.  The wound was thoroughly irrigated and vancomycin  powder was placed.  The wound was closed in layers of 0 Vicryl 2-0 Vicryl and 3-0 nylon.  This was dressed with Xeroform gauze Webril and Ace wrap.   Next, attention was then turned to the left knee.  Was prepped and draped in a separate fashion.  Timeout was performed again.  15 blade was used to incise through his area of decompressed hematoma/infection.  There was a thick bursal layer encountered.  This was sent for culture.  The bursa was then resected en bloc using Bovie cautery.  There was deep suture identified in the patella which was removed with ronguer.  The remaining tissue was healthy appearing with good bleeding bed.  The wound was thoroughly irrigated with 3 L of normal saline including within the joint.The wound was then closed in layers with interrupted  0-vicryl, 2-0 vicryl, and 3-0 nylon. The incision was dressed with aquacel dressing, webril, and a hinged knee brace was placed in full extension.  The patient awoke from anesthesia without difficulty and was transferred to the PACU in stable condition.      POSTOPERATIVE PLAN: He will be weightbearing as tolerated on bilateral lower extremities.  He will be in a straight brace on the right locked in extension.  I will follow-up his cultures for antibiotic treatment.  Will defer to infectious disease for management definitively of antibiotics.  [name], MD 6:10 PM    "

## 2024-03-17 NOTE — Anesthesia Procedure Notes (Signed)
 Procedure Name: Intubation Date/Time: 03/17/2024 4:27 PM  Performed by: Mannie Krystal LABOR, CRNAPre-anesthesia Checklist: Patient identified, Emergency Drugs available, Suction available and Patient being monitored Patient Re-evaluated:Patient Re-evaluated prior to induction Oxygen Delivery Method: Circle system utilized Preoxygenation: Pre-oxygenation with 100% oxygen Induction Type: IV induction Ventilation: Mask ventilation without difficulty Laryngoscope Size: Mac and 4 Grade View: Grade I Tube type: Oral Tube size: 7.0 mm Number of attempts: 1 Airway Equipment and Method: Stylet Placement Confirmation: ETT inserted through vocal cords under direct vision, positive ETCO2 and breath sounds checked- equal and bilateral Secured at: 23 cm Tube secured with: Tape Dental Injury: Teeth and Oropharynx as per pre-operative assessment

## 2024-03-17 NOTE — Plan of Care (Signed)

## 2024-03-18 DIAGNOSIS — F419 Anxiety disorder, unspecified: Secondary | ICD-10-CM | POA: Diagnosis not present

## 2024-03-18 DIAGNOSIS — M71162 Other infective bursitis, left knee: Secondary | ICD-10-CM | POA: Diagnosis not present

## 2024-03-18 DIAGNOSIS — J449 Chronic obstructive pulmonary disease, unspecified: Secondary | ICD-10-CM | POA: Diagnosis not present

## 2024-03-18 DIAGNOSIS — I10 Essential (primary) hypertension: Secondary | ICD-10-CM | POA: Diagnosis not present

## 2024-03-18 DIAGNOSIS — M009 Pyogenic arthritis, unspecified: Secondary | ICD-10-CM | POA: Diagnosis not present

## 2024-03-18 DIAGNOSIS — M7042 Prepatellar bursitis, left knee: Secondary | ICD-10-CM | POA: Diagnosis not present

## 2024-03-18 DIAGNOSIS — K2100 Gastro-esophageal reflux disease with esophagitis, without bleeding: Secondary | ICD-10-CM | POA: Diagnosis not present

## 2024-03-18 DIAGNOSIS — M7110 Other infective bursitis, unspecified site: Secondary | ICD-10-CM

## 2024-03-18 DIAGNOSIS — I480 Paroxysmal atrial fibrillation: Secondary | ICD-10-CM | POA: Diagnosis not present

## 2024-03-18 DIAGNOSIS — M00062 Staphylococcal arthritis, left knee: Secondary | ICD-10-CM | POA: Diagnosis not present

## 2024-03-18 DIAGNOSIS — F32A Depression, unspecified: Secondary | ICD-10-CM | POA: Diagnosis not present

## 2024-03-18 LAB — BASIC METABOLIC PANEL WITH GFR
Anion gap: 9 (ref 5–15)
BUN: 23 mg/dL (ref 8–23)
CO2: 24 mmol/L (ref 22–32)
Calcium: 8.2 mg/dL — ABNORMAL LOW (ref 8.9–10.3)
Chloride: 105 mmol/L (ref 98–111)
Creatinine, Ser: 1.01 mg/dL (ref 0.61–1.24)
GFR, Estimated: >60 mL/min
Glucose, Bld: 138 mg/dL — ABNORMAL HIGH (ref 70–99)
Potassium: 4.3 mmol/L (ref 3.5–5.1)
Sodium: 138 mmol/L (ref 135–145)

## 2024-03-18 LAB — CBC
HCT: 42.4 % (ref 39.0–52.0)
Hemoglobin: 14.5 g/dL (ref 13.0–17.0)
MCH: 31.7 pg (ref 26.0–34.0)
MCHC: 34.2 g/dL (ref 30.0–36.0)
MCV: 92.8 fL (ref 80.0–100.0)
Platelets: 269 K/uL (ref 150–400)
RBC: 4.57 MIL/uL (ref 4.22–5.81)
RDW: 13.3 % (ref 11.5–15.5)
WBC: 12.6 K/uL — ABNORMAL HIGH (ref 4.0–10.5)
nRBC: 0 % (ref 0.0–0.2)

## 2024-03-18 LAB — BODY FLUID CULTURE W GRAM STAIN
Culture: NO GROWTH
Gram Stain: NONE SEEN

## 2024-03-18 MED ORDER — INFLUENZA VAC SPLIT HIGH-DOSE 0.5 ML IM SUSY
0.5000 mL | PREFILLED_SYRINGE | INTRAMUSCULAR | Status: DC
  Filled 2024-03-18: qty 0.5

## 2024-03-18 MED ORDER — METHOCARBAMOL 1000 MG/10ML IJ SOLN
500.0000 mg | Freq: Once | INTRAMUSCULAR | Status: AC | PRN
  Administered 2024-03-18: 500 mg via INTRAVENOUS
  Filled 2024-03-18: qty 10

## 2024-03-18 MED ORDER — POLYETHYLENE GLYCOL 3350 17 G PO PACK
17.0000 g | PACK | Freq: Two times a day (BID) | ORAL | Status: DC
  Administered 2024-03-18 – 2024-03-19 (×2): 17 g via ORAL
  Filled 2024-03-18 (×5): qty 1
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
## 2024-03-18 NOTE — Progress Notes (Signed)
" ° °  Inpatient Rehabilitation Admissions Coordinator   Met with patient at bedside for rehab assessment. I know him from previous CIR admit 2/25 for 6 days LOS and returned home with his wife. We discussed goals and expectations of a possible CIR admit. He prefers CIR for rehab. Family can provide expected caregiver support that is recommended . I have contacted acute therapy dept for need OT eval to begin Auth with [employer]. Please call me with any questions.   Heron Leavell, RN, MSN Rehab Admissions Coordinator [phone]   "

## 2024-03-18 NOTE — Progress Notes (Signed)
 "        [name]                                                                               Paul [name], is a 79 y.o. male, DOB - July 03, 1945, FMW:[id number] Admit date - 03/15/2024    Outpatient Primary MD for the patient is [name], [name], MD  LOS - 3  days    Brief summary   79 year old male with hypertension, HLP, GERD, A-fib, CAD, COPD, history of DVT, BPH, low back pain presented with left knee swelling and pain.  Patient reported multiple issues with his left leg/knee this year.  Earlier in the year, had a knee surgery following an initial injury in February 2025 when he fell while walking his dog..  After the fall, was found to have bilateral quadriceps tendon ruptures, which was fixed with Dr. Beverley.  Subsequently, had infection of the knee and has undergone 3 separate washouts.  Most recently in July and that time he also had bacteremia.   He has been followed up by infectious disease and completed a course of antibiotics in August via PICC line.  Had been doing well. Followed up with Dr Genelle orthopedics on 02/28/2024 and there was concern for bursitis and was also some concern for right quad tear.  He was started on steroids and antibiotics.  He repeated this course but I continue worsening of his swelling and pain in his left knee.  Reports he noticed some pus draining from the left knee as well.     Assessment & Plan    Assessment and Plan:   Suspected septic joint of left knee joint (HCC),  - history of recurrent knee infection, status post washout x 3, most recently treated for septic arthritis and bacteremia in July 2025.  Completed course of antibiotics via PICC line in August -MRI left knee showed complex tear involving the posterior horn and mid body region of the medial meniscus and macerated appearance.  Intact ligamentous structures - Ortho consulted, patient underwent right quadriceps tendon repair with Achilles allograft as well as left knee  prepatellar bursal resection.  ID on board for antibiotics, plan to continue with cefazolin .   Hypertension Stable.    CAD - Continue ARB, statin   Hyperlipidemia Continue with statin.    GERD - Continue home PPI   Paroxysmal atrial fibrillation Not on meds at this time.    Depression Anxiety - Continue Wellbutrin    BPH - Continue finasteride    History of DVT  - Noted, not on any anticoagulation   COPD - Currently  no wheezing.   Constipation: Increased the miralax  to BID.  Add dulcolax suppository.    Estimated body mass index is 26.45 kg/m as calculated from the following:   Height as of this encounter: 6' (1.829 m).   Weight as of this encounter: 88.5 kg.  Code Status: full code.  DVT Prophylaxis:  SCDs Start: 03/15/24 1723   Level of Care: Level of care: Telemetry Family Communication: none at bedside.   Disposition Plan:     Remains inpatient appropriate:  pending CIR.    Procedures:  right quadriceps tendon repair with Achilles  allograft as well as left knee prepatellar bursal resection.   Consultants:   Orthopedics ID.   Antimicrobials:   Anti-infectives (From admission, onward)    Start     Dose/Rate Route Frequency Ordered Stop   03/17/24 2200  ceFAZolin  (ANCEF ) IVPB 2g/100 mL premix  Status:  Discontinued        2 g 200 mL/hr over 30 Minutes Intravenous Every 8 hours 03/17/24 1938 03/17/24 1940   03/17/24 1722  vancomycin  (VANCOCIN ) powder  Status:  Discontinued          As needed 03/17/24 1722 03/17/24 1819   03/17/24 1545  ceFAZolin  (ANCEF ) IVPB 2g/100 mL premix        2 g 200 mL/hr over 30 Minutes Intravenous Every 8 hours 03/17/24 1541     03/16/24 0600  vancomycin  (VANCOCIN ) IVPB 1000 mg/200 mL premix  Status:  Discontinued        1,000 mg 200 mL/hr over 60 Minutes Intravenous Every 12 hours 03/15/24 1922 03/17/24 1541   03/16/24 0000  ceFEPIme  (MAXIPIME ) 2 g in sodium chloride  0.9 % 100 mL IVPB  Status:  Discontinued        2  g 200 mL/hr over 30 Minutes Intravenous Every 8 hours 03/15/24 1922 03/17/24 1541   03/15/24 1600  vancomycin  (VANCOCIN ) IVPB 1000 mg/200 mL premix  Status:  Discontinued        1,000 mg 200 mL/hr over 60 Minutes Intravenous  Once 03/15/24 1554 03/15/24 1555   03/15/24 1600  ceFEPIme  (MAXIPIME ) 2 g in sodium chloride  0.9 % 100 mL IVPB        2 g 200 mL/hr over 30 Minutes Intravenous  Once 03/15/24 1554 03/15/24 1650   03/15/24 1600  vancomycin  (VANCOREADY) IVPB 1750 mg/350 mL        1,750 mg 175 mL/hr over 120 Minutes Intravenous  Once 03/15/24 1555 03/15/24 1859        Medications  Scheduled Meds:  aspirin   325 mg Oral Daily   atorvastatin   10 mg Oral Daily   buPROPion   150 mg Oral Daily   finasteride   5 mg Oral Daily   [START ON 03/19/2024] Influenza vac split trivalent PF  0.5 mL Intramuscular Tomorrow-1000   irbesartan   150 mg Oral Daily   lidocaine -EPINEPHrine   5 mL     pantoprazole   40 mg Oral Daily   polyethylene glycol  17 g Oral BID   senna-docusate  2 tablet Oral QHS   sodium chloride  flush  3 mL Intravenous Q12H   Continuous Infusions:   ceFAZolin  (ANCEF ) IV 2 g (03/18/24 1443)   PRN Meds:.acetaminophen  **OR** acetaminophen , bisacodyl , HYDROcodone -acetaminophen , HYDROmorphone  (DILAUDID ) injection, lidocaine -EPINEPHrine     Subjective:   Yama Nielson was seen and examined today.  Pain is at bay, no new complaints.   Objective:   Vitals:   03/17/24 2008 03/18/24 0347 03/18/24 0733 03/18/24 1317  BP: 126/73 120/63 128/70 103/62  Pulse: 70 77 70 84  Resp:  16 19 18   Temp:  97.8 F (36.6 C) 97.6 F (36.4 C) 98.3 F (36.8 C)  TempSrc:   Oral   SpO2: 97% 96% 98% 96%  Weight:      Height:        Intake/Output Summary (Last 24 hours) at 03/18/2024 1540 Last data filed at 03/18/2024 0445 Gross per 24 hour  Intake 1700 ml  Output 500 ml  Net 1200 ml   Filed Weights   03/15/24 1201  Weight: 88.5 kg  Exam General: Alert and oriented x 3,  NAD Cardiovascular: S1 S2 auscultated, no murmurs, RRR Respiratory: Clear to auscultation bilaterally, no wheezing, rales or rhonchi Gastrointestinal: Soft, nontender, nondistended, + bowel sounds Ext: no pedal edema bilaterally Neuro: AAOx3,   Data Reviewed:  I have personally reviewed following labs and imaging studies   CBC Lab Results  Component Value Date   WBC 12.6 (H) 03/18/2024   RBC 4.57 03/18/2024   HGB 14.5 03/18/2024   HCT 42.4 03/18/2024   MCV 92.8 03/18/2024   MCH 31.7 03/18/2024   PLT 269 03/18/2024   MCHC 34.2 03/18/2024   RDW 13.3 03/18/2024   LYMPHSABS 1.6 03/15/2024   MONOABS 1.1 (H) 03/15/2024   EOSABS 0.0 03/15/2024   BASOSABS 0.1 03/15/2024     Last metabolic panel Lab Results  Component Value Date   NA 138 03/18/2024   K 4.3 03/18/2024   CL 105 03/18/2024   CO2 24 03/18/2024   BUN 23 03/18/2024   CREATININE 1.01 03/18/2024   GLUCOSE 138 (H) 03/18/2024   GFRNONAA >60 03/18/2024   GFRAA  04/09/2010    >60        The eGFR has been calculated using the MDRD equation. This calculation has not been validated in all clinical situations. eGFR's persistently <60 mL/min signify possible Chronic Kidney Disease.   CALCIUM  8.2 (L) 03/18/2024   PHOS 2.4 (L) 05/03/2023   PROT 5.9 (L) 03/16/2024   ALBUMIN 3.7 03/16/2024   LABGLOB 2.0 05/28/2023   BILITOT 0.4 03/16/2024   ALKPHOS 46 03/16/2024   AST 36 03/16/2024   ALT 14 03/16/2024   ANIONGAP 9 03/18/2024    CBG (last 3)  Recent Labs    03/15/24 1655  GLUCAP 104*      Coagulation Profile: No results for input(s): INR, PROTIME in the last 168 hours.   Radiology Studies: No results found.     Elgie Butter M.D. [name] 03/18/2024, 3:40 PM  Available via Epic secure chat 7am-7pm After 7 pm, please refer to night coverage provider listed on amion.    "

## 2024-03-18 NOTE — Progress Notes (Signed)
" ° °  Subjective:  Patient reports pain as mild.Tolerating diet. Voiding.   Objective:   VITALS:   Vitals:   03/17/24 1954 03/17/24 2008 03/18/24 0347 03/18/24 0733  BP: 126/73 126/73 120/63 128/70  Pulse: 66 70 77 70  Resp:   16 19  Temp: 97.8 F (36.6 C)  97.8 F (36.6 C) 97.6 F (36.4 C)  TempSrc: Oral   Oral  SpO2: 100% 97% 96% 98%  Weight:      Height:        Dressings are clean and intact bilaterally.  Fires bilateral dorsiflexors with a strong 2+ dorsalis pedis pulse  Lab Results  Component Value Date   WBC 12.6 (H) 03/18/2024   HGB 14.5 03/18/2024   HCT 42.4 03/18/2024   MCV 92.8 03/18/2024   PLT 269 03/18/2024     Assessment/Plan:  1 Day Post-Op with right quadriceps tendon repair with Achilles allograft as well as left knee prepatellar bursal resection.  The left knee did have infection that was isolated purely to the prebursal area and not deep to the bone or joint.  - Patient to work with PT to optimize mobilization safely -I do believe the patient would be a strong candidate for acute rehab -Defer to infectious disease for management of his prepatellar bursitis although I do think orals would be reasonable given that the infection was quite isolated to the prebursal area - DVT ppx - SCDs, ambulation, aspirin  325 mg daily while admitted - WBAT operative extremity - Pain control - multimodal pain management, ATC acetaminophen  in conjunction with as needed narcotic (oxycodone ), although this should be minimized with other modalities  - Discharge planning pending CM, appreciate coordination    Sydne Krahl 03/18/2024, 8:12 AM  "

## 2024-03-18 NOTE — Progress Notes (Signed)
 Physical Therapy Evaluation Patient Details Name: Paul [name]. MRN: [id number] DOB: 1945-07-07 Today's Date: 03/18/2024  History of Present Illness  Pt is a pleasant 79 y.o gentleman who arrived to ED on 03/15/24 for swelling/drainage L knee. Hx of fall in February 2025 with multiple knee surgeries since, has required I&Ds and PICC antibiotics. Imaging significant for R knee complete re-tear of previously repaired quad tendon, meniscal repair. Ortho consulted, pt underwent R quad tendon repair and L knee I&D w/ bursectomy on 03/17/24. Per op note, pt to be WBAT BIL LE with R knee locked in extension. PMH significant for HTN, HLD, GERD, Afib, CAD, COPD, DVT, BPH, depression/anxiety, and reactive airway disease.  Clinical Impression  Arrive to room, pt supine and motivated to participate with PT. He is able to perform supine<>sit transfers with assist for management of RLE, +1 minA for sit<>stand transfers from lowest bed surface, and CGA-minA +1 for ambulation 5ft w/ RW. Cues/education throughout on post op restrictions, RLE management, pacing of activities, and strategies to improve safety/efficiency. Mobility limited today primarily by reduced activity tolerance and muscular fatigue, difficulty managing RLE as expected w/ acuity of surgery and post op restrictions. Pt voices strong motivation for inpatient rehab as he has had positive experiences in the past and prior to admission states he was very independent, continuing to work and drive. No adverse events, tolerates session well overall. Plan to follow in acute setting to reduce fall risk and maximize safety, recommend post acute follow up to address aforementioned deficits and maximize functional independence.       If plan is discharge home, recommend the following: A little help with walking and/or transfers;A little help with bathing/dressing/bathroom;Assistance with cooking/housework;Assist for transportation;Help with stairs or ramp for  entrance   Can travel by private vehicle        Equipment Recommendations Rolling walker (2 wheels);BSC/3in1;Wheelchair (measurements PT)  Recommendations for Other Services  OT consult (requiring assist for lower body dressing, anticipated increase in difficulty w/ ADLs given R LE restrictions)    Functional Status Assessment Patient has had a recent decline in their functional status and demonstrates the ability to make significant improvements in function in a reasonable and predictable amount of time.     Precautions / Restrictions Precautions Precautions: Knee;Fall Precaution/Restrictions Comments: R knee locked in extension Required Braces or Orthoses: Knee Immobilizer - Right Knee Immobilizer - Right: On at all times Restrictions Weight Bearing Restrictions Per Provider Order: Yes RLE Weight Bearing Per Provider Order: Weight bearing as tolerated LLE Weight Bearing Per Provider Order: Weight bearing as tolerated      Mobility  Bed Mobility Overal bed mobility: Modified Independent             General bed mobility comments: supine to sitting EOB w/ HOB elevated, contralateral LE assist for RLE management, guarding from PT. Sit to supine transfer pt able to perform safely w/o PT assist, utilizing gait belt for RLE management    Transfers Overall transfer level: Needs assistance Equipment used: Rolling walker (2 wheels) Transfers: Sit to/from Stand             General transfer comment: minA +1 for sit<>stand from lowest bed surface, cues for RLE management/positioning, UE sequencing and safe utilization of RW    Ambulation/Gait Ambulation/Gait assistance: Contact guard assist, Min assist Gait Distance (Feet): 40 Feet Assistive device: Rolling walker (2 wheels) Gait Pattern/deviations: Step-to pattern, Decreased step length - right, Decreased step length - left, Decreased stance time -  right, Trunk flexed       General Gait Details: cues for AD  management, pacing, UT relaxation; education on appropriate weight shifting/mechanics within tolerance  Stairs            Wheelchair Mobility     Tilt Bed    Modified Rankin (Stroke Patients Only)       Balance Overall balance assessment: Needs assistance Sitting-balance support: No upper extremity supported Sitting balance-Leahy Scale: Normal     Standing balance support: Bilateral upper extremity supported, Reliant on assistive device for balance Standing balance-Leahy Scale: Poor Standing balance comment: requiring RW to maintain appropriate balance/posture in standing position                             Pertinent Vitals/Pain Pain Assessment Pain Assessment: 0-10 Pain Score: 5  Pain Location: R knee Pain Descriptors / Indicators: Constant, Operative site guarding Pain Intervention(s): Limited activity within patient's tolerance, Monitored during session, Premedicated before session    Home Living Family/patient expects to be discharged to:: Inpatient rehab                   Additional Comments: Pt voices motivation to return to CIR to maxmize functional independence    Prior Function Prior Level of Function : Independent/Modified Independent;Working/employed;Driving                     Extremity/Trunk Assessment   Upper Extremity Assessment Upper Extremity Assessment: Overall WFL for tasks assessed    Lower Extremity Assessment Lower Extremity Assessment: RLE deficits/detail;LLE deficits/detail RLE Deficits / Details: assessment limited given post op restrictions and knee immobilizer - apparent functional weakness as expected with basic mobility LLE Deficits / Details: at least antigravity strength in major muscle groups, muscular endurance limited per functional mobility assessment    Cervical / Trunk Assessment Cervical / Trunk Assessment: Normal  Communication   Communication Communication: No apparent difficulties     Cognition Arousal: Alert Behavior During Therapy: WFL for tasks assessed/performed   PT - Cognitive impairments: No apparent impairments                         Following commands: Intact       Cueing Cueing Techniques: Verbal cues, Tactile cues, Visual cues     General Comments General comments (skin integrity, edema, etc.): limited by muscular/exertional fatigue, no adverse symptoms or increase in pain noted    Exercises     Assessment/Plan    PT Assessment Patient needs continued PT services  PT Problem List Decreased strength;Decreased range of motion;Decreased activity tolerance;Decreased balance;Decreased mobility;Pain       PT Treatment Interventions DME instruction;Gait training;Stair training;Functional mobility training;Therapeutic activities;Therapeutic exercise;Balance training;Neuromuscular re-education;Patient/family education;Wheelchair mobility training;Modalities    PT Goals (Current goals can be found in the Care Plan section)  Acute Rehab PT Goals Patient Stated Goal: go to inpatient rehab, get stronger PT Goal Formulation: With patient Time For Goal Achievement: 04/01/24 Potential to Achieve Goals: Good    Frequency Min 3X/week     Co-evaluation               AM-PAC PT 6 Clicks Mobility  Outcome Measure Help needed turning from your back to your side while in a flat bed without using bedrails?: None Help needed moving from lying on your back to sitting on the side of a flat bed without using bedrails?: A Little  Help needed moving to and from a bed to a chair (including a wheelchair)?: A Little Help needed standing up from a chair using your arms (e.g., wheelchair or bedside chair)?: A Little Help needed to walk in hospital room?: A Little Help needed climbing 3-5 steps with a railing? : A Lot 6 Click Score: 18    End of Session Equipment Utilized During Treatment: Right knee immobilizer Activity Tolerance: Patient tolerated  treatment well;No increased pain;Patient limited by fatigue Patient left: in bed;with call bell/phone within reach Nurse Communication: Other (comment) (nurse present in room on PT arrival assisting with medication and disconnecting IV for mobility) PT Visit Diagnosis: Other abnormalities of gait and mobility (R26.89);Muscle weakness (generalized) (M62.81)    Time: 9155-9072 PT Time Calculation (min) (ACUTE ONLY): 43 min   Charges:   PT Evaluation $PT Eval Low Complexity: 1 Low PT Treatments $Therapeutic Activity: 23-37 mins PT General Charges $$ ACUTE PT VISIT: 1 Visit         Alm DELENA Jenny PT, DPT 03/18/2024 10:53 AM

## 2024-03-18 NOTE — Anesthesia Postprocedure Evaluation (Signed)
"   Anesthesia Post Note  Patient: Paul [name].  Procedure(s) Performed: REPAIR, MUSCLE, QUADRICEPS OR HAMSTRING (Right: Knee) IRRIGATION AND DEBRIDEMENT KNEE (Left: Knee)     Patient location during evaluation: PACU Anesthesia Type: General Level of consciousness: awake and alert Pain management: pain level controlled Vital Signs Assessment: post-procedure vital signs reviewed and stable Respiratory status: spontaneous breathing, nonlabored ventilation, respiratory function stable and patient connected to nasal cannula oxygen Cardiovascular status: blood pressure returned to baseline and stable Postop Assessment: no apparent nausea or vomiting Anesthetic complications: no   No notable events documented.                [name]      "

## 2024-03-18 NOTE — Progress Notes (Signed)
 Inpatient Rehab Admissions Coordinator Note:   Per therapy recommendations patient was screened for CIR candidacy by Reche FORBES Lowers, PT. At this time, pt appears to be a potential candidate for CIR. I will place an order for rehab consult for full assessment, per our protocol.  Please contact me any with questions.SABRA Reche Lowers, PT, DPT [phone] 03/18/2024 1:04 PM

## 2024-03-19 DIAGNOSIS — J449 Chronic obstructive pulmonary disease, unspecified: Secondary | ICD-10-CM | POA: Diagnosis not present

## 2024-03-19 DIAGNOSIS — K2100 Gastro-esophageal reflux disease with esophagitis, without bleeding: Secondary | ICD-10-CM | POA: Diagnosis not present

## 2024-03-19 DIAGNOSIS — M009 Pyogenic arthritis, unspecified: Secondary | ICD-10-CM | POA: Diagnosis not present

## 2024-03-19 DIAGNOSIS — I10 Essential (primary) hypertension: Secondary | ICD-10-CM | POA: Diagnosis not present

## 2024-03-19 NOTE — Progress Notes (Signed)
 Physical Therapy Treatment Patient Details Name: Paul [name]. MRN: [id number] DOB: [birth year] Today's Date: 03/19/2024   History of Present Illness Pt is a pleasant 79 y.o gentleman who arrived to ED on 03/15/24 for swelling/drainage L knee. Hx of fall in February 2025 with multiple knee surgeries since, has required I&Ds and PICC antibiotics. Imaging significant for R knee complete re-tear of previously repaired quad tendon, meniscal repair. Ortho consulted, pt underwent R quad tendon repair and L knee I&D w/ bursectomy on 03/17/24. Per op note, pt to be WBAT BIL LE with R knee locked in extension. PMH significant for HTN, HLD, GERD, Afib, CAD, COPD, DVT, BPH, depression/anxiety, and reactive airway disease.    PT Comments  Received pt semi-reclined in bed and agreeable to PT treatment. Pt performed bed mobility mod I using bed features and gait belt to lift RLE. Pt performed all transfers with RW and mod I and ambulated through hallway with RW and distant supervision (of note, pt able to place full weight on RLE today). Discussed pt's improvements in therapy and changing recommendations to OPPT vs AIR - pt in agreement. Returned to bed and assisted pt in replacing KI with Bledsoe brace locked in extension. Also discussed technique for stair navigation (pt verbalized confidence with task and reports having 2 handrails on steps to enter garage).    If plan is discharge home, recommend the following: A little help with walking and/or transfers;A little help with bathing/dressing/bathroom;Assistance with cooking/housework;Assist for transportation;Help with stairs or ramp for entrance   Can travel by private vehicle        Equipment Recommendations  Rolling walker (2 wheels);BSC/3in1;Wheelchair (measurements PT)    Recommendations for Other Services       Precautions / Restrictions Precautions Precautions: Knee;Fall;Other (comment) Precaution/Restrictions Comments: R bledsoe brace locked in  extension Required Braces or Orthoses: Other Brace Knee Immobilizer - Right: On at all times Restrictions Weight Bearing Restrictions Per Provider Order: No RLE Weight Bearing Per Provider Order: Weight bearing as tolerated LLE Weight Bearing Per Provider Order: Weight bearing as tolerated     Mobility  Bed Mobility Overal bed mobility: Modified Independent             General bed mobility comments: use of gait belt as leg lifter Patient Response: Cooperative  Transfers Overall transfer level: Modified independent Equipment used: Rolling walker (2 wheels) Transfers: Sit to/from Stand Sit to Stand: Modified independent (Device/Increase time)           General transfer comment: from elevated EOB to simulate bed height at home    Ambulation/Gait Ambulation/Gait assistance: Supervision Gait Distance (Feet): 100 Feet Assistive device: Rolling walker (2 wheels) Gait Pattern/deviations: Decreased step length - right, Decreased step length - left, Decreased stance time - right, Step-through pattern Gait velocity: slightly decreased Gait velocity interpretation: 1.31 - 2.62 ft/sec, indicative of limited community ambulator   General Gait Details: no LOB noted, pt able to place full weight on RLE   Stairs             Wheelchair Mobility     Tilt Bed Tilt Bed Patient Response: Cooperative  Modified Rankin (Stroke Patients Only)       Balance Overall balance assessment: Needs assistance Sitting-balance support: No upper extremity supported, Feet supported Sitting balance-Leahy Scale: Normal     Standing balance support: Bilateral upper extremity supported, Reliant on assistive device for balance (RW) Standing balance-Leahy Scale: Good Standing balance comment: able to maintain static standing balance mod  I and distant supervision for dynamic standing balance/coordination                            Communication Communication Communication:  No apparent difficulties  Cognition Arousal: Alert Behavior During Therapy: WFL for tasks assessed/performed   PT - Cognitive impairments: No apparent impairments                         Following commands: Intact      Cueing    Exercises      General Comments        Pertinent Vitals/Pain Pain Assessment Pain Assessment: 0-10 Pain Score: 4  Pain Location: R knee Pain Descriptors / Indicators: Sore Pain Intervention(s): Limited activity within patient's tolerance, Monitored during session, Premedicated before session, Repositioned    Home Living Family/patient expects to be discharged to:: Private residence Living Arrangements: Spouse/significant other Available Help at Discharge: Family;Available 24 hours/day Type of Home: House Home Access: Stairs to enter Entrance Stairs-Rails: None Entrance Stairs-Number of Steps: 3   Home Layout: Two level;Able to live on main level with bedroom/bathroom Home Equipment: Shower seat;Hand held Programmer, Systems (2 wheels);Shower seat - built Scientist, Clinical (histocompatibility And Immunogenetics)      Prior Function            PT Goals (current goals can now be found in the care plan section) Acute Rehab PT Goals Patient Stated Goal: to go home PT Goal Formulation: With patient Time For Goal Achievement: 04/01/24 Potential to Achieve Goals: Good Progress towards PT goals: Progressing toward goals    Frequency    Min 3X/week      PT Plan      Co-evaluation              AM-PAC PT 6 Clicks Mobility   Outcome Measure  Help needed turning from your back to your side while in a flat bed without using bedrails?: None Help needed moving from lying on your back to sitting on the side of a flat bed without using bedrails?: A Little Help needed moving to and from a bed to a chair (including a wheelchair)?: None Help needed standing up from a chair using your arms (e.g., wheelchair or bedside chair)?: None Help needed to walk in  hospital room?: A Little Help needed climbing 3-5 steps with a railing? : A Lot 6 Click Score: 20    End of Session   Activity Tolerance: Patient tolerated treatment well;No increased pain Patient left: in bed;with call bell/phone within reach Nurse Communication: Mobility status PT Visit Diagnosis: Other abnormalities of gait and mobility (R26.89);Muscle weakness (generalized) (M62.81);Pain Pain - Right/Left: Right Pain - part of body: Knee     Time: 9064-9044 PT Time Calculation (min) (ACUTE ONLY): 20 min  Charges:    $Therapeutic Activity: 8-22 mins PT General Charges $$ ACUTE PT VISIT: 1 Visit                     Therisa Stains PT, DPT Therisa HERO Zaunegger 03/19/2024, 10:01 AM

## 2024-03-19 NOTE — Care Management Important Message (Signed)
 Important Message  Patient Details  Name: Paul [name]. MRN: [id number] Date of Birth: [birth year]   Important Message Given:  Yes - Medicare IM     Jennie Laneta Dragon 03/19/2024, 12:17 PM

## 2024-03-19 NOTE — Evaluation (Signed)
 Occupational Therapy Evaluation Patient Details Name: Paul [name]. MRN: [id number] DOB: 1945-07-01 Today's Date: 03/19/2024   History of Present Illness   Pt is a pleasant 79 y.o gentleman who arrived to ED on 03/15/24 for swelling/drainage L knee. Hx of fall in February 2025 with multiple knee surgeries since, has required I&Ds and PICC antibiotics. Imaging significant for R knee complete re-tear of previously repaired quad tendon, meniscal repair. Ortho consulted, pt underwent R quad tendon repair and L knee I&D w/ bursectomy on 03/17/24. Per op note, pt to be WBAT BIL LE with R knee locked in extension. PMH significant for HTN, HLD, GERD, Afib, CAD, COPD, DVT, BPH, depression/anxiety, and reactive airway disease.     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 Pt presents now reportedly feeling better than yesterday with minor pain in R knee. Pt able to manage all transfers/in room mobility using RW with Supervision without safety concerns. Pt requires Supervision-Min A for LB ADLs but familiar with AE use, compensatory strategies and has wife assist as needed. Pt reports feeling well enough for DC directly home without need for postacute OT.      If plan is discharge home, recommend the following:   A little help with bathing/dressing/bathroom;Assistance with cooking/housework;Help with stairs or ramp for entrance;Assist for transportation     Functional Status Assessment   Patient has had a recent decline in their functional status and demonstrates the ability to make significant improvements in function in a reasonable and predictable amount of time.     Equipment Recommendations   None recommended by OT     Recommendations for Other Services         Precautions/Restrictions   Precautions Precautions:  Knee;Fall;Other (comment) Precaution/Restrictions Comments: R knee locked in extension, ok to wear bledsole brace instead per surgeon Required Braces or Orthoses: Knee Immobilizer - Right Knee Immobilizer - Right: On at all times Restrictions Weight Bearing Restrictions Per Provider Order: Yes RLE Weight Bearing Per Provider Order: Weight bearing as tolerated LLE Weight Bearing Per Provider Order: Weight bearing as tolerated     Mobility Bed Mobility Overal bed mobility: Modified Independent             General bed mobility comments: use of gait belt as leg lifter    Transfers Overall transfer level: Needs assistance Equipment used: Rolling walker (2 wheels) Transfers: Sit to/from Stand Sit to Stand: Supervision           General transfer comment: Supervision from very elevated bed with RW, able to later return demo standing from low recliner pushing from armrests easily      Balance Overall balance assessment: Needs assistance Sitting-balance support: No upper extremity supported Sitting balance-Leahy Scale: Normal     Standing balance support: Bilateral upper extremity supported, Reliant on assistive device for balance Standing balance-Leahy Scale: Fair                             ADL either performed or assessed with clinical judgement   ADL Overall ADL's : Needs assistance/impaired Eating/Feeding: Independent   Grooming: Supervision/safety;Set up;Standing;Wash/dry face;Oral care   Upper Body Bathing: Modified independent   Lower Body Bathing: Supervison/ safety;Sitting/lateral leans;Sit to/from stand   Upper Body Dressing : Modified independent   Lower Body Dressing: Supervision/safety;Set up;Minimal assistance;Sit to/from stand;Sitting/lateral  leans Lower Body Dressing Details (indicate cue type and reason): issues reaching to R foot d/t knee in extension but familiar with AE to use and reports working to reach this foot. Toilet Transfer:  Contact guard assist;Ambulation;Rolling walker (2 wheels)   Toileting- Clothing Manipulation and Hygiene: Supervision/safety;Sitting/lateral lean;Sit to/from stand       Functional mobility during ADLs: Supervision/safety;Rolling walker (2 wheels) General ADL Comments: Pt familiar with AE, discussed showering techniques, surgeon confirmed pt able to wear personal bledsole brace instead of KI (reinforced locked in extension).     Vision Ability to See in Adequate Light: 0 Adequate Patient Visual Report: No change from baseline Vision Assessment?: No apparent visual deficits     Perception         Praxis         Pertinent Vitals/Pain Pain Assessment Pain Assessment: Faces Faces Pain Scale: Hurts a little bit Pain Location: R knee Pain Descriptors / Indicators: Grimacing, Sore Pain Intervention(s): Monitored during session, Limited activity within patient's tolerance, Premedicated before session     Extremity/Trunk Assessment Upper Extremity Assessment Upper Extremity Assessment: Overall WFL for tasks assessed;Right hand dominant   Lower Extremity Assessment Lower Extremity Assessment: Defer to PT evaluation   Cervical / Trunk Assessment Cervical / Trunk Assessment: Normal   Communication Communication Communication: No apparent difficulties   Cognition Arousal: Alert Behavior During Therapy: WFL for tasks assessed/performed Cognition: No apparent impairments                               Following commands: Intact       Cueing  General Comments   Cueing Techniques: Verbal cues;Tactile cues;Visual cues      Exercises     Shoulder Instructions      Home Living Family/patient expects to be discharged to:: Private residence Living Arrangements: Spouse/significant other Available Help at Discharge: Family;Available 24 hours/day Type of Home: House Home Access: Stairs to enter [employer] of Steps: 3 Entrance Stairs-Rails:  None Home Layout: Two level;Able to live on main level with bedroom/bathroom     Bathroom Shower/Tub: Producer, Television/film/video: Standard Bathroom Accessibility: Yes How Accessible: Accessible via walker;Accessible via wheelchair Home Equipment: Shower seat;Hand held Programmer, Systems (2 wheels);Shower seat - built Designer, Fashion/clothing: Reacher        Prior Functioning/Environment Prior Level of Function : Independent/Modified Independent;Working/employed;Driving             Mobility Comments: Use of cane mostly, RW as needed ADLs Comments: Pt reports mod I with ADLs, works in airline pilot for [employer]    OT Problem List: Decreased activity tolerance;Impaired balance (sitting and/or standing);Pain   OT Treatment/Interventions: Self-care/ADL training;Therapeutic exercise;Energy conservation;DME and/or AE instruction;Therapeutic activities;Patient/family education;Balance training      OT Goals(Current goals can be found in the care plan section)   Acute Rehab OT Goals Patient Stated Goal: hoping for a full recovery OT Goal Formulation: With patient Time For Goal Achievement: 04/02/24 Potential to Achieve Goals: Good   OT Frequency:  Min 2X/week    Co-evaluation              AM-PAC OT 6 Clicks Daily Activity     Outcome Measure Help from another person eating meals?: None Help from another person taking care of personal grooming?: None Help from another person toileting, which includes using toliet, bedpan, or urinal?: A Little Help from another person bathing (including washing, rinsing, drying)?:  A Little Help from another person to put on and taking off regular upper body clothing?: None Help from another person to put on and taking off regular lower body clothing?: A Little 6 Click Score: 21   End of Session Equipment Utilized During Treatment: Gait belt;Rolling walker (2 wheels);Right knee immobilizer Nurse Communication:  Mobility status  Activity Tolerance: Patient tolerated treatment well Patient left: in chair;with call bell/phone within reach;Other (comment) (with surgeon at bedside)  OT Visit Diagnosis: Other abnormalities of gait and mobility (R26.89);Unsteadiness on feet (R26.81);Muscle weakness (generalized) (M62.81)                Time: 9265-9188 OT Time Calculation (min): 37 min Charges:  OT General Charges $OT Visit: 1 Visit OT Evaluation $OT Eval Moderate Complexity: 1 Mod OT Treatments $Self Care/Home Management : 8-22 mins  Mliss NOVAK, OTR/L Acute Rehab Services Office: [phone]   Mliss Fish 03/19/2024, 8:24 AM

## 2024-03-19 NOTE — Progress Notes (Signed)
" ° °  Inpatient Rehabilitation Admissions Coordinator   OP therapy now recommended. I met with patient at bedside. We will sign off. TOC made aware.  Heron Leavell, RN, MSN Rehab Admissions Coordinator [phone] 03/19/2024 12:43 PM  "

## 2024-03-19 NOTE — Progress Notes (Signed)
 "        [name]                                                                               Paul [name], is a 79 y.o. male, DOB - 09-Apr-1945, FMW:[id number] Admit date - 03/15/2024    Outpatient Primary MD for the patient is [name], [name], MD  LOS - 4  days    Brief summary   79 year old male with hypertension, HLP, GERD, A-fib, CAD, COPD, history of DVT, BPH, low back pain presented with left knee swelling and pain.  Patient reported multiple issues with his left leg/knee this year.  Earlier in the year, had a knee surgery following an initial injury in February 2025 when he fell while walking his dog..  After the fall, was found to have bilateral quadriceps tendon ruptures, which was fixed with Dr. Beverley.  Subsequently, had infection of the knee and has undergone 3 separate washouts.  Most recently in July and that time he also had bacteremia.   He has been followed up by infectious disease and completed a course of antibiotics in August via PICC line.  Had been doing well. Followed up with Dr Genelle orthopedics on 02/28/2024 and there was concern for bursitis and was also some concern for right quad tear.  He was started on steroids and antibiotics.  He repeated this course but I continue worsening of his swelling and pain in his left knee.  Reports he noticed some pus draining from the left knee as well.     Assessment & Plan    Assessment and Plan:   Suspected septic joint of left knee joint (HCC),  - history of recurrent knee infection, status post washout x 3, most recently treated for septic arthritis and bacteremia in July 2025.  Completed course of antibiotics via PICC line in August -MRI left knee showed complex tear involving the posterior horn and mid body region of the medial meniscus and macerated appearance.  Intact ligamentous structures - Ortho consulted, patient underwent right quadriceps tendon repair with Achilles allograft as well as left knee  prepatellar bursal resection.  ID on board for antibiotics, plan to continue with cefazolin .  Cultures showing rare staph, continue to monitor.  Therapy eval recommending outpatient PT.   Hypertension Well controlled BP parameters.    CAD - Continue ARB, statin   Hyperlipidemia Continue with statin.    GERD - Continue home PPI   Paroxysmal atrial fibrillation Not on meds at this time.    Depression Anxiety - Continue Wellbutrin    BPH - Continue finasteride    History of DVT  - Noted, not on any anticoagulation   COPD Stable. No sob.    Constipation: Increased the miralax  to BID.  Add dulcolax suppository if no BM today.    Estimated body mass index is 26.45 kg/m as calculated from the following:   Height as of this encounter: 6' (1.829 m).   Weight as of this encounter: 88.5 kg.  Code Status: full code.  DVT Prophylaxis:  SCDs Start: 03/15/24 1723   Level of Care: Level of care: Telemetry Family Communication: none at bedside.   Disposition Plan:  Remains inpatient appropriate: pending culture report.     Procedures:  right quadriceps tendon repair with Achilles allograft as well as left knee prepatellar bursal resection.   Consultants:   Orthopedics ID.   Antimicrobials:   Anti-infectives (From admission, onward)    Start     Dose/Rate Route Frequency Ordered Stop   03/17/24 2200  ceFAZolin  (ANCEF ) IVPB 2g/100 mL premix  Status:  Discontinued        2 g 200 mL/hr over 30 Minutes Intravenous Every 8 hours 03/17/24 1938 03/17/24 1940   03/17/24 1722  vancomycin  (VANCOCIN ) powder  Status:  Discontinued          As needed 03/17/24 1722 03/17/24 1819   03/17/24 1545  ceFAZolin  (ANCEF ) IVPB 2g/100 mL premix        2 g 200 mL/hr over 30 Minutes Intravenous Every 8 hours 03/17/24 1541     03/16/24 0600  vancomycin  (VANCOCIN ) IVPB 1000 mg/200 mL premix  Status:  Discontinued        1,000 mg 200 mL/hr over 60 Minutes Intravenous Every 12 hours  03/15/24 1922 03/17/24 1541   03/16/24 0000  ceFEPIme  (MAXIPIME ) 2 g in sodium chloride  0.9 % 100 mL IVPB  Status:  Discontinued        2 g 200 mL/hr over 30 Minutes Intravenous Every 8 hours 03/15/24 1922 03/17/24 1541   03/15/24 1600  vancomycin  (VANCOCIN ) IVPB 1000 mg/200 mL premix  Status:  Discontinued        1,000 mg 200 mL/hr over 60 Minutes Intravenous  Once 03/15/24 1554 03/15/24 1555   03/15/24 1600  ceFEPIme  (MAXIPIME ) 2 g in sodium chloride  0.9 % 100 mL IVPB        2 g 200 mL/hr over 30 Minutes Intravenous  Once 03/15/24 1554 03/15/24 1650   03/15/24 1600  vancomycin  (VANCOREADY) IVPB 1750 mg/350 mL        1,750 mg 175 mL/hr over 120 Minutes Intravenous  Once 03/15/24 1555 03/15/24 1859        Medications  Scheduled Meds:  aspirin   325 mg Oral Daily   atorvastatin   10 mg Oral Daily   buPROPion   150 mg Oral Daily   finasteride   5 mg Oral Daily   Influenza vac split trivalent PF  0.5 mL Intramuscular Tomorrow-1000   irbesartan   150 mg Oral Daily   lidocaine -EPINEPHrine   5 mL     pantoprazole   40 mg Oral Daily   polyethylene glycol  17 g Oral BID   senna-docusate  2 tablet Oral QHS   sodium chloride  flush  3 mL Intravenous Q12H   Continuous Infusions:   ceFAZolin  (ANCEF ) IV 2 g (03/19/24 1308)   PRN Meds:.acetaminophen  **OR** acetaminophen , bisacodyl , HYDROcodone -acetaminophen , HYDROmorphone  (DILAUDID ) injection, lidocaine -EPINEPHrine     Subjective:   Paul [name] was seen and examined today. No new complaints. Waiting for cultures to come back.   Objective:   Vitals:   03/18/24 1939 03/19/24 0337 03/19/24 0740 03/19/24 1602  BP: 116/65 113/66 129/75 115/74  Pulse: 78 70 68 73  Resp: 18 18 18 18   Temp: 98.9 F (37.2 C) 98.5 F (36.9 C) 97.9 F (36.6 C) 98.4 F (36.9 C)  TempSrc: Oral Oral    SpO2: 97% 97% 97% 96%  Weight:      Height:        Intake/Output Summary (Last 24 hours) at 03/19/2024 1736 Last data filed at 03/19/2024 1646 Gross per 24  hour  Intake 840 ml  Output 1125 ml  Net -285 ml   Filed Weights   03/15/24 1201  Weight: 88.5 kg     Exam General exam: Appears calm and comfortable  Respiratory system: Clear to auscultation. Respiratory effort normal. Cardiovascular system: S1 & S2 heard, RRR.  Gastrointestinal system: Abdomen is nondistended, soft and nontender.  Central nervous system: Alert and oriented. No focal neurological deficits. Extremities: Symmetric 5 x 5 power. Skin: No rashes, Psychiatry:  Mood & affect appropriate.    Data Reviewed:  I have personally reviewed following labs and imaging studies   CBC Lab Results  Component Value Date   WBC 12.6 (H) 03/18/2024   RBC 4.57 03/18/2024   HGB 14.5 03/18/2024   HCT 42.4 03/18/2024   MCV 92.8 03/18/2024   MCH 31.7 03/18/2024   PLT 269 03/18/2024   MCHC 34.2 03/18/2024   RDW 13.3 03/18/2024   LYMPHSABS 1.6 03/15/2024   MONOABS 1.1 (H) 03/15/2024   EOSABS 0.0 03/15/2024   BASOSABS 0.1 03/15/2024     Last metabolic panel Lab Results  Component Value Date   NA 138 03/18/2024   K 4.3 03/18/2024   CL 105 03/18/2024   CO2 24 03/18/2024   BUN 23 03/18/2024   CREATININE 1.01 03/18/2024   GLUCOSE 138 (H) 03/18/2024   GFRNONAA >60 03/18/2024   GFRAA  04/09/2010    >60        The eGFR has been calculated using the MDRD equation. This calculation has not been validated in all clinical situations. eGFR's persistently <60 mL/min signify possible Chronic Kidney Disease.   CALCIUM  8.2 (L) 03/18/2024   PHOS 2.4 (L) 05/03/2023   PROT 5.9 (L) 03/16/2024   ALBUMIN 3.7 03/16/2024   LABGLOB 2.0 05/28/2023   BILITOT 0.4 03/16/2024   ALKPHOS 46 03/16/2024   AST 36 03/16/2024   ALT 14 03/16/2024   ANIONGAP 9 03/18/2024    CBG (last 3)  No results for input(s): GLUCAP in the last 72 hours.     Coagulation Profile: No results for input(s): INR, PROTIME in the last 168 hours.   Radiology Studies: No results  found.     Elgie Butter M.D. [name] 03/19/2024, 5:36 PM  Available via [employer] secure chat 7am-7pm After 7 pm, please refer to night coverage provider listed on amion.    "

## 2024-03-20 ENCOUNTER — Other Ambulatory Visit (HOSPITAL_COMMUNITY): Payer: Self-pay

## 2024-03-20 ENCOUNTER — Other Ambulatory Visit (HOSPITAL_BASED_OUTPATIENT_CLINIC_OR_DEPARTMENT_OTHER): Payer: Self-pay

## 2024-03-20 ENCOUNTER — Ambulatory Visit (HOSPITAL_BASED_OUTPATIENT_CLINIC_OR_DEPARTMENT_OTHER): Admitting: Orthopaedic Surgery

## 2024-03-20 DIAGNOSIS — M00062 Staphylococcal arthritis, left knee: Secondary | ICD-10-CM

## 2024-03-20 DIAGNOSIS — B9561 Methicillin susceptible Staphylococcus aureus infection as the cause of diseases classified elsewhere: Secondary | ICD-10-CM | POA: Diagnosis not present

## 2024-03-20 DIAGNOSIS — R7881 Bacteremia: Secondary | ICD-10-CM | POA: Diagnosis not present

## 2024-03-20 DIAGNOSIS — K2100 Gastro-esophageal reflux disease with esophagitis, without bleeding: Secondary | ICD-10-CM | POA: Diagnosis not present

## 2024-03-20 DIAGNOSIS — I10 Essential (primary) hypertension: Secondary | ICD-10-CM | POA: Diagnosis not present

## 2024-03-20 DIAGNOSIS — M71162 Other infective bursitis, left knee: Secondary | ICD-10-CM | POA: Diagnosis not present

## 2024-03-20 DIAGNOSIS — F32A Depression, unspecified: Secondary | ICD-10-CM | POA: Diagnosis not present

## 2024-03-20 LAB — CULTURE, BLOOD (ROUTINE X 2)
Culture: NO GROWTH
Culture: NO GROWTH
Special Requests: ADEQUATE
Special Requests: ADEQUATE

## 2024-03-20 LAB — CBC WITH DIFFERENTIAL/PLATELET
Abs Immature Granulocytes: 0.09 K/uL — ABNORMAL HIGH (ref 0.00–0.07)
Basophils Absolute: 0.1 K/uL (ref 0.0–0.1)
Basophils Relative: 1 %
Eosinophils Absolute: 0.2 K/uL (ref 0.0–0.5)
Eosinophils Relative: 2 %
HCT: 38.6 % — ABNORMAL LOW (ref 39.0–52.0)
Hemoglobin: 13.2 g/dL (ref 13.0–17.0)
Immature Granulocytes: 1 %
Lymphocytes Relative: 17 %
Lymphs Abs: 1.4 K/uL (ref 0.7–4.0)
MCH: 31.5 pg (ref 26.0–34.0)
MCHC: 34.2 g/dL (ref 30.0–36.0)
MCV: 92.1 fL (ref 80.0–100.0)
Monocytes Absolute: 1.2 K/uL — ABNORMAL HIGH (ref 0.1–1.0)
Monocytes Relative: 14 %
Neutro Abs: 5.8 K/uL (ref 1.7–7.7)
Neutrophils Relative %: 65 %
Platelets: 229 K/uL (ref 150–400)
RBC: 4.19 MIL/uL — ABNORMAL LOW (ref 4.22–5.81)
RDW: 13.4 % (ref 11.5–15.5)
WBC: 8.8 K/uL (ref 4.0–10.5)
nRBC: 0 % (ref 0.0–0.2)

## 2024-03-20 LAB — BASIC METABOLIC PANEL WITH GFR
Anion gap: 7 (ref 5–15)
BUN: 16 mg/dL (ref 8–23)
CO2: 27 mmol/L (ref 22–32)
Calcium: 8.1 mg/dL — ABNORMAL LOW (ref 8.9–10.3)
Chloride: 104 mmol/L (ref 98–111)
Creatinine, Ser: 0.78 mg/dL (ref 0.61–1.24)
GFR, Estimated: >60 mL/min
Glucose, Bld: 95 mg/dL (ref 70–99)
Potassium: 3.9 mmol/L (ref 3.5–5.1)
Sodium: 138 mmol/L (ref 135–145)

## 2024-03-20 MED ORDER — ASPIRIN 325 MG PO TBEC
325.0000 mg | DELAYED_RELEASE_TABLET | Freq: Every day | ORAL | 0 refills | Status: AC
  Filled 2024-03-20: qty 14, 14d supply, fill #0

## 2024-03-20 MED ORDER — ACETAMINOPHEN 500 MG PO TABS
500.0000 mg | ORAL_TABLET | Freq: Three times a day (TID) | ORAL | 0 refills | Status: AC
  Filled 2024-03-20: qty 30, 10d supply, fill #0

## 2024-03-20 MED ORDER — OXYCODONE HCL 5 MG PO TABS
5.0000 mg | ORAL_TABLET | ORAL | 0 refills | Status: DC | PRN
  Filled 2024-03-20: qty 15, 3d supply, fill #0

## 2024-03-20 MED ORDER — CEFADROXIL 500 MG PO CAPS
1000.0000 mg | ORAL_CAPSULE | Freq: Two times a day (BID) | ORAL | 0 refills | Status: AC
  Filled 2024-03-20: qty 56, 14d supply, fill #0

## 2024-03-20 MED ORDER — IBUPROFEN 800 MG PO TABS
800.0000 mg | ORAL_TABLET | Freq: Three times a day (TID) | ORAL | 0 refills | Status: AC
  Filled 2024-03-20: qty 30, 10d supply, fill #0

## 2024-03-20 MED ORDER — HYDROCODONE-ACETAMINOPHEN 5-325 MG PO TABS
1.0000 | ORAL_TABLET | Freq: Four times a day (QID) | ORAL | 0 refills | Status: DC | PRN
  Filled 2024-03-20: qty 20, 5d supply, fill #0

## 2024-03-20 MED ORDER — CEFADROXIL 500 MG PO CAPS: 1000.0000 mg | ORAL_CAPSULE | Freq: Two times a day (BID) | ORAL | Status: DC

## 2024-03-20 NOTE — Progress Notes (Signed)
 Mobility Specialist Progress Note:   03/20/24 0931  Mobility  Activity Ambulated with assistance (In hallway)  Level of Assistance Standby assist, set-up cues, supervision of patient - no hands on  Assistive Device Front wheel walker  Distance Ambulated (ft) 125 ft  RLE Weight Bearing Per Provider Order WBAT  LLE Weight Bearing Per Provider Order WBAT  Activity Response Tolerated well  Mobility Referral Yes  Mobility visit 1 Mobility  Mobility Specialist Start Time (ACUTE ONLY) 0919  Mobility Specialist Stop Time (ACUTE ONLY) 0931  Mobility Specialist Time Calculation (min) (ACUTE ONLY) 12 min   Received pt in bed and agreeable to mobility. No physical assistance required. Pt c/o slight RLE pain (RN notified). Returned to room without fault. Left pt in bed. Personal belongings and call light within reach. All needs met.  Paul [name] Mobility Specialist  Please contact via [employer] or  Rehab Office [phone]

## 2024-03-20 NOTE — Discharge Summary (Signed)
 " Physician Discharge Summary   Patient: Paul [name]. MRN: [id number] DOB: 1945-11-25  Admit date:     03/15/2024  Discharge date: 03/20/2024  Discharge Physician: Elgie Butter   PCP: Garald [name], MD   Recommendations at discharge:  Please follow up with ID and orthopedics as recommended.   Discharge Diagnoses: Principal Problem:   Septic joint of left knee joint (HCC) Active Problems:   Anxiety disorder   Depression   Hypertension   GERD (gastroesophageal reflux disease)   Coronary atherosclerosis   COPD, mild (HCC)   Mixed hyperlipidemia   Paroxysmal atrial fibrillation (HCC)   History of DVT (deep vein thrombosis)   Rupture of right quadriceps muscle   Prepatellar bursitis of left knee   Septic bursitis  Resolved Problems:   * No resolved hospital problems. *  Hospital Course: 79 year old male with hypertension, HLP, GERD, A-fib, CAD, COPD, history of DVT, BPH, low back pain presented with left knee swelling and pain.  Patient reported multiple issues with his left leg/knee this year.  Earlier in the year, had a knee surgery following an initial injury in February 2025 when he fell while walking his dog..  After the fall, was found to have bilateral quadriceps tendon ruptures, which was fixed with Dr. Beverley.  Subsequently, had infection of the knee and has undergone 3 separate washouts.  Most recently in July and that time he also had bacteremia.   He has been followed up by infectious disease and completed a course of antibiotics in August via PICC line.  Had been doing well. Followed up with Dr Genelle orthopedics on 02/28/2024 and there was concern for bursitis and was also some concern for right quad tear.  He was started on steroids and antibiotics.  He repeated this course but I continue worsening of his swelling and pain in his left knee.  Reports he noticed some pus draining from the left knee as well.    Assessment and Plan:  Suspected septic joint of  left knee joint (HCC),  - history of recurrent knee infection, status post washout x 3, most recently treated for septic arthritis and bacteremia in July 2025.  Completed course of antibiotics via PICC line in August -MRI left knee showed complex tear involving the posterior horn and mid body region of the medial meniscus and macerated appearance.  Intact ligamentous structures - Ortho consulted, patient underwent right quadriceps tendon repair with Achilles allograft as well as left knee prepatellar bursal resection.  ID on board for antibiotics,  recommended cefadroxil  to complete the course.   Therapy eval recommending outpatient PT.    Hypertension Well controlled BP parameters.    CAD - Continue ARB, statin   Hyperlipidemia Continue with statin.    GERD - Continue home PPI   Paroxysmal atrial fibrillation Not on meds at this time.    Depression Anxiety - Continue Wellbutrin    BPH - Continue finasteride    History of DVT  - Noted, not on any anticoagulation   COPD Stable. No sob.    Constipation: Increased the miralax  to BID.  Add dulcolax suppository if no BM today.      Estimated body mass index is 26.45 kg/m as calculated from the following:   Height as of this encounter: 6' (1.829 m).   Weight as of this encounter: 88.5 kg.     Consultants: ID, orthopedics. Procedures performed: none.   Disposition: Home Diet recommendation:  Regular diet DISCHARGE MEDICATION: Allergies as of 03/20/2024  Reactions   Other Itching   PATCHES. Patient states that any patch on his skin causes him to itch         Medication List     STOP taking these medications    methylPREDNISolone  4 MG Tbpk tablet Commonly known as: MEDROL  DOSEPAK   triamterene -hydrochlorothiazide  37.5-25 MG tablet Commonly known as: MAXZIDE -25       TAKE these medications    Acetaminophen  Extra Strength 500 MG Tabs Take 1 tablet (500 mg total) by mouth every 8 (eight) hours for  10 days.   aspirin  EC 325 MG tablet Take 1 tablet (325 mg total) by mouth daily.   atorvastatin  10 MG tablet Commonly known as: LIPITOR Take 1 tablet by mouth daily.   buPROPion  150 MG 24 hr tablet Commonly known as: WELLBUTRIN  XL Take 1 tablet by mouth daily.   cefadroxil  500 MG capsule Commonly known as: DURICEF Take 2 capsules (1,000 mg total) by mouth 2 (two) times daily for 14 days. Take through 04/03/24   finasteride  5 MG tablet Commonly known as: PROSCAR  Take 1 tablet by mouth daily.   ibuprofen  800 MG tablet Commonly known as: ADVIL  Take 1 tablet (800 mg total) by mouth every 8 (eight) hours for 10 days. Please take with food, please alternate with acetaminophen    MULTIVITAMIN ADULT PO Take 3 capsules by mouth in the morning.   oxyCODONE  5 MG immediate release tablet Commonly known as: Roxicodone  Take 1 tablet (5 mg total) by mouth every 4 (four) hours as needed for severe pain (pain score 7-10) or breakthrough pain.   pantoprazole  40 MG tablet Commonly known as: PROTONIX  Take 1 tablet by mouth daily. Annual appointment due in October, must see provider for future refills.   testosterone  cypionate 200 MG/ML injection Commonly known as: DEPOTESTOSTERONE CYPIONATE Inject 0.5 mLs (100 mg total) into the muscle once a week.   valsartan  160 MG tablet Commonly known as: DIOVAN  Take 1 tablet (160 mg total) by mouth daily.   zolpidem  10 MG tablet Commonly known as: AMBIEN  TAKE 1 TABLET BY MOUTH AT BEDTIME AS NEEDED               Discharge Care Instructions  (From admission, onward)           Start     Ordered   03/20/24 0000  Discharge wound care:       Comments: As per orthopedics.   03/20/24 1157            Follow-up Information     [facility] Follow up.   Specialty: Rehabilitation Why: They will call you referral sent. Please call them if you have not heard in a few days Contact  information: [address] [place]  [address] [phone]               Discharge Exam: Fredricka Weights   03/15/24 1201  Weight: 88.5 kg   General exam: Appears calm and comfortable  Respiratory system: Clear to auscultation. Respiratory effort normal. Cardiovascular system: S1 & S2 heard, RRR.  Gastrointestinal system: Abdomen is nondistended, soft and nontender.  Central nervous system: Alert and oriented. No focal neurological deficits. Extremities: Symmetric 5 x 5 power. Skin: No rashes,  Psychiatry:  Mood & affect appropriate.    Condition at discharge: fair  The results of significant diagnostics from this hospitalization (including imaging, microbiology, ancillary and laboratory) are listed below for reference.   Imaging Studies: MR KNEE RIGHT  WO CONTRAST Result Date: 03/16/2024 CLINICAL DATA:  Pain. Concern for quadriceps re-tear. 10 months status post repair of bilateral quadriceps ruptures. EXAM: MRI OF THE RIGHT KNEE WITHOUT CONTRAST TECHNIQUE: Multiplanar, multisequence MR imaging of the knee was performed. No intravenous contrast was administered. COMPARISON:  MRI right knee dated 04/29/2023. FINDINGS: MENISCI Medial: Redemonstrated complex tear of the body and posterior horn of the medial meniscus with extrusion of the body into the medial gutter. Similar 5 mm low signal structure within the medial gutter along the undersurface of the body of the medial meniscus likely represents a displaced meniscal fragment. Lateral: Horizontal tear of the body and posterior horn of the lateral meniscus with degeneration. LIGAMENTS Cruciates: ACL and PCL are intact. Collaterals: Medial collateral ligament is intact. Lateral collateral ligament complex is intact. CARTILAGE Patellofemoral: Moderate partial-thickness cartilage loss of the medial patellar facet. Medial: Mild partial-thickness cartilage loss of the medial femorotibial compartment. Lateral: Mild  partial-thickness cartilage loss of the lateral femorotibial compartment. JOINT: Trace joint effusion. Normal Hoffa's fat-pad. No plical thickening. POPLITEAL FOSSA: Popliteus tendon is intact. Tiny Baker's cyst. EXTENSOR MECHANISM: Postoperative changes related to interval repair of distal quadriceps tendon rupture with evidence of complete re-tear of the distal quadriceps tendon from the insertion on the superior pole of the patella with approximately 2.8 cm of tendon retraction. Intact patellar tendon. Intact lateral patellar retinaculum. Intact medial patellar retinaculum. BONES: No fracture or dislocation. Similar mild lateral shift of the tibia relative to the femur. No aggressive osseous lesion. Other: No fluid collection or hematoma. IMPRESSION: 1. Complete re-tear of the previously repaired right distal quadriceps tendon from the insertion on the superior patellar pole with approximately 2.8 cm of tendon retraction. 2. Redemonstrated complex tear of the body and posterior horn of the medial meniscus with extrusion of the body into the medial gutter. Similar suspected 5 mm displaced meniscal fragment within the medial gutter along the undersurface of the body of the medial meniscus. 3. Horizontal tear of the body and posterior horn of the lateral meniscus with degeneration. 4. Tricompartmental osteoarthritis with cartilage abnormalities, as described above. 5. Similar mild lateral shift of the tibia relative to the femur. 6. Trace knee joint effusion. Electronically Signed   By: Harrietta Sherry M.D.   On: 03/16/2024 14:51   MR KNEE LEFT W WO CONTRAST Result Date: 03/15/2024 CLINICAL DATA:  Knee pain and swelling. History of prior knee surgeries. EXAM: MRI OF THE LEFT KNEE WITHOUT AND WITH CONTRAST TECHNIQUE: Multiplanar, multisequence MR imaging of the knee was performed before and after the administration of intravenous contrast. CONTRAST:  9mL GADAVIST  GADOBUTROL  1 MMOL/ML IV SOLN COMPARISON:  MRI  08/26/2023 FINDINGS: MENISCI Medial meniscus: Complex tear involving the posterior horn and mid body region with a macerated appearance. This is new since the prior MRI examination. Lateral meniscus:  Intact LIGAMENTS Cruciates:  Intact.  Mucoid degeneration of the ACL. Collaterals:  Intact. CARTILAGE Patellofemoral: Significant degenerative chondrosis/chondromalacia with areas of full-thickness cartilage loss. There is also moderate edema like signal changes in the patella. Medial: Severe and progressive medial compartment degenerative changes with full-thickness cartilage loss, joint space narrowing, marrow edema and subchondral cystic change. Lateral: Moderate to advanced degenerative chondrosis progressive since prior study. Joint:  Small joint effusion and mild synovitis. Popliteal Fossa:  No popliteal mass.  Very small Baker's cyst. Extensor Mechanism: The patella retinacular structures are intact. There is marked thickening of the lateral retinaculum possibly related to prior surgical repair with fibrosis. Severe chronic tendinopathy involving the  quadriceps tendon which has been surgically repaired. Partial-thickness tearing of the tendon medially. Bones: Moderate diffuse edema like signal changes involving the patella possibly reactive related to the quadriceps disease. Multiple small bone anchors are noted in the patella. Other: Mild subcutaneous inflammation/edema and enhancement. There is also moderate enhancement of the supra tele joint space medially likely synovitis. IMPRESSION: 1. Complex tear involving the posterior horn and mid body region of the medial meniscus with a macerated appearance. This is new since the prior MRI examination. 2. Intact ligamentous structures. 3. Significant and progressive tricompartmental degenerative changes most severe in the medial compartment. 4. Severe chronic tendinopathy involving the quadriceps tendon which has been surgically repaired. Partial-thickness tearing  of the tendon medially. 5. Small joint effusion and mild synovitis. 6. Diffuse marrow edema involving the patella likely stress related process. Electronically Signed   By: MYRTIS Stammer M.D.   On: 03/15/2024 22:00   DG Knee 2 Views Left Result Date: 03/15/2024 CLINICAL DATA:  Knee pain, known staph infection of left knee. EXAM: LEFT KNEE - 1-2 VIEW COMPARISON:  08/12/2023. FINDINGS: No acute fracture or dislocation is seen. No bony erosions or periosteal elevation. There is a small suprapatellar joint effusion. Mild degenerative changes are noted in the medial patellofemoral compartments. A dystrophic calcification is noted in the soft tissues, likely related to prior trauma. Soft tissue swelling is present anterior to the knee. Vascular calcifications are noted in the soft tissues. IMPRESSION: 1. No acute osseous abnormality. 2. Soft tissue swelling anterior to the knee and small joint effusion. Electronically Signed   By: Leita Birmingham M.D.   On: 03/15/2024 15:39    Microbiology: Results for orders placed or performed during the hospital encounter of 03/15/24  Blood culture (routine x 2)     Status: None   Collection Time: 03/15/24 12:13 PM   Specimen: BLOOD  Result Value Ref Range Status   Specimen Description BLOOD RIGHT ANTECUBITAL  Final   Special Requests   Final    BOTTLES DRAWN AEROBIC AND ANAEROBIC Blood Culture adequate volume   Culture   Final    NO GROWTH 5 DAYS Performed at [facility], [address] [place], [place] [address]    Report Status 03/20/2024 FINAL  Final  Blood culture (routine x 2)     Status: None   Collection Time: 03/15/24 12:18 PM   Specimen: BLOOD  Result Value Ref Range Status   Specimen Description BLOOD LEFT ANTECUBITAL  Final   Special Requests   Final    BOTTLES DRAWN AEROBIC AND ANAEROBIC Blood Culture adequate volume   Culture   Final    NO GROWTH 5 DAYS Performed at [facility], [address] [place], [place] [address]     Report Status 03/20/2024 FINAL  Final  Body fluid culture w Gram Stain     Status: None   Collection Time: 03/15/24  3:54 PM   Specimen: Path fluid; Body Fluid  Result Value Ref Range Status   Specimen Description FLUID  Final   Special Requests SYNOVIAL,LEFT KNEE  Final   Gram Stain NO WBC SEEN NO ORGANISMS SEEN   Final   Culture   Final    NO GROWTH 3 DAYS Performed at [facility], [address], [place], [place] [address]    Report Status 03/18/2024 FINAL  Final  Aerobic/Anaerobic Culture w Gram Stain (surgical/deep wound)     Status: None (Preliminary result)   Collection Time: 03/17/24  5:51 PM   Specimen: Soft Tissue, Other  Result Value Ref Range Status   Specimen Description TISSUE LEFT KNEE  Final   Special Requests BURSA  Final   Gram Stain   Final    RARE WBC PRESENT, PREDOMINANTLY PMN NO ORGANISMS SEEN Performed at [facility], [address], [place], [place] [address]    Culture   Final    RARE STAPHYLOCOCCUS AUREUS NO ANAEROBES ISOLATED; CULTURE IN PROGRESS FOR 5 DAYS    Report Status PENDING  Incomplete   Organism ID, Bacteria STAPHYLOCOCCUS AUREUS  Final      Susceptibility   Staphylococcus aureus - MIC*    CIPROFLOXACIN  <=0.5 SENSITIVE Sensitive     ERYTHROMYCIN >=8 RESISTANT Resistant     GENTAMICIN  <=0.5 SENSITIVE Sensitive     OXACILLIN 0.5 SENSITIVE Sensitive     TETRACYCLINE <=1 SENSITIVE Sensitive     VANCOMYCIN  1 SENSITIVE Sensitive     TRIMETH /SULFA  <=10 SENSITIVE Sensitive     CLINDAMYCIN RESISTANT Resistant     RIFAMPIN <=0.5 SENSITIVE Sensitive     Inducible Clindamycin POSITIVE Resistant     LINEZOLID 2 SENSITIVE Sensitive     * RARE STAPHYLOCOCCUS AUREUS    Labs: CBC: Recent Labs  Lab 03/15/24 1230 03/16/24 0232 03/18/24 0508 03/20/24 0600  WBC 10.9* 8.2 12.6* 8.8  NEUTROABS 8.0*  --   --  5.8  HGB 16.2 15.1 14.5 13.2  HCT 47.9 44.3 42.4 38.6*  MCV 93.6 93.1 92.8 92.1  PLT 313 254 269 229   Basic Metabolic  Panel: Recent Labs  Lab 03/15/24 1230 03/16/24 0232 03/18/24 0508 03/20/24 0600  NA 133* 136 138 138  K 4.5 4.1 4.3 3.9  CL 100 103 105 104  CO2 20* 24 24 27   GLUCOSE 97 116* 138* 95  BUN 23 21 23 16   CREATININE 0.87 0.82 1.01 0.78  CALCIUM  9.6 8.5* 8.2* 8.1*   Liver Function Tests: Recent Labs  Lab 03/15/24 1230 03/16/24 0232  AST 52* 36  ALT 17 14  ALKPHOS 55 46  BILITOT 0.6 0.4  PROT 6.8 5.9*  ALBUMIN 4.1 3.7   CBG: Recent Labs  Lab 03/15/24 1655  GLUCAP 104*    Discharge time spent: 37 minutes.  Signed: Elgie Butter, MD [facility] 03/20/2024 "

## 2024-03-20 NOTE — Progress Notes (Signed)
 Discharge Nurse Summary: DC order noted per MD. DC RN at bedside with patient. Patient agreeable with discharge plan, agreeable to transport to dc lounge to await TOC meds and family pickup. AVS printed/reviewed. PIV removed. Telemonitor not present on assessment. No DME needs. TOC meds pending pickup. CP/Edu resolved. Patient dressed and ready to go. All belongings accounted for. Transport wheeled patient downstairs to [employer].   Rosario Lund, RN

## 2024-03-20 NOTE — TOC Transition Note (Signed)
 Transition of Care [facility]) - Discharge Note   Patient Details  Name: Paul [name]. MRN: [id number] Date of Birth: March 21, 1945  Transition [facility]) CM/SW Contact:  Corean JAYSON Canary, RN Phone Number: 03/20/2024, 12:44 PM   Clinical Narrative:     Patient is transitioning home with [facility] referral  Final next level of care: Home/Self Care Barriers to Discharge: No Barriers Identified   Patient Goals and CMS Choice            Discharge Placement                       Discharge Plan and Services Additional resources added to the After Visit Summary for                                       Social Drivers of Health (SDOH) Interventions SDOH Screenings   Food Insecurity: No Food Insecurity (03/16/2024)  Housing: Low Risk (03/16/2024)  Transportation Needs: No Transportation Needs (03/16/2024)  Utilities: Not At Risk (03/16/2024)  Alcohol Screen: Low Risk (10/13/2023)  Depression (PHQ2-9): Medium Risk (12/25/2023)  Financial Resource Strain: Low Risk (10/13/2023)  Physical Activity: Inactive (10/13/2023)  Social Connections: Moderately Isolated (03/16/2024)  Stress: No Stress Concern Present (10/13/2023)  Tobacco Use: Medium Risk (03/17/2024)     Readmission Risk Interventions    09/05/2023    1:45 PM 08/20/2023   11:26 AM 05/01/2023    1:40 PM  Readmission Risk Prevention Plan  Transportation Screening Complete Complete Complete  PCP or Specialist Appt within 5-7 Days   Complete  PCP or Specialist Appt within 3-5 Days Complete Complete   Home Care Screening   Complete  Medication Review (RN CM)   Complete  HRI or Home Care Consult Complete Complete   Social Work Consult for Recovery Care Planning/Counseling Complete Complete   Palliative Care Screening Not Applicable Not Applicable   Medication Review Oceanographer) Complete Complete

## 2024-03-20 NOTE — Plan of Care (Signed)

## 2024-03-20 NOTE — Progress Notes (Signed)
 Physical Therapy Treatment Patient Details Name: Paul [name]. MRN: [id number] DOB: [birth year] Today's Date: 03/20/2024   History of Present Illness Pt is a pleasant 79 y.o gentleman who arrived to ED on 03/15/24 for swelling/drainage L knee. Hx of fall in February 2025 with multiple knee surgeries since, has required I&Ds and PICC antibiotics. Imaging significant for R knee complete re-tear of previously repaired quad tendon, meniscal repair. Ortho consulted, pt underwent R quad tendon repair and L knee I&D w/ bursectomy on 03/17/24. Per op note, pt to be WBAT BIL LE with R knee locked in extension. PMH significant for HTN, HLD, GERD, Afib, CAD, COPD, DVT, BPH, depression/anxiety, and reactive airway disease.    PT Comments  Pt received in supine, agreeable to therapy session, pt requesting to get dressed prior to working on gait/stair training, mostly modI to don clothing in long sitting and at EOB. Pt needing up to Supervision for gait and stair training with L rail, with RW support for gait. Min cues for improved body mechanics/AD use, otherwise pt modI for other aspects of mobility. RN notified when session complete to facilitate DC to lounge, OP PT remains appropriate for pt.     If plan is discharge home, recommend the following: A little help with walking and/or transfers;A little help with bathing/dressing/bathroom;Assistance with cooking/housework;Assist for transportation;Help with stairs or ramp for entrance   Can travel by private vehicle        Equipment Recommendations  Rolling walker (2 wheels);BSC/3in1;Wheelchair (measurements PT)    Recommendations for Other Services       Precautions / Restrictions Precautions Precautions: Knee;Fall;Other (comment) Recall of Precautions/Restrictions: Intact Precaution/Restrictions Comments: R bledsoe brace locked in extension Required Braces or Orthoses: Other Brace Knee Immobilizer - Right: On at all times Other Brace: Bledsoe in  extension Restrictions Weight Bearing Restrictions Per Provider Order: Yes RLE Weight Bearing Per Provider Order: Weight bearing as tolerated LLE Weight Bearing Per Provider Order: Weight bearing as tolerated     Mobility  Bed Mobility Overal bed mobility: Modified Independent             General bed mobility comments: use of gait belt as leg lifter, pt received with HOB up    Transfers Overall transfer level: Modified independent Equipment used: Rolling walker (2 wheels) Transfers: Sit to/from Stand Sit to Stand: Modified independent (Device/Increase time)           General transfer comment: to/from bed, no apparent difficulty    Ambulation/Gait Ambulation/Gait assistance: Modified independent (Device/Increase time) Gait Distance (Feet): 125 Feet Assistive device: Rolling walker (2 wheels) Gait Pattern/deviations: Decreased step length - right, Decreased step length - left, Decreased stance time - right, Step-through pattern Gait velocity: slightly decreased     General Gait Details: no LOB noted, pt able to place full weight on RLE; pt encouraged to stay closer within RW to assist with offloading pressure from RLE during the day, as he reports RLE pain is 6-7 while mobilizing. RN also notified of pt pain score.   Stairs Stairs: Yes Stairs assistance: Supervision Stair Management: One rail Left, Step to pattern, Forwards Number of Stairs: 4 General stair comments: Initial cues and demo for backward step-up vs forward step-up vs sideways, depending on how pt feels when ascending. Pt also shown how to use RW instead of rail if pain more severe, as pt only has L rail at home. When pt demos back, he uses L rail and reports no significant increased pain, he does  not need HHA on side opposite rail, no buckling or imbalance.   Wheelchair Mobility     Tilt Bed    Modified Rankin (Stroke Patients Only)       Balance Overall balance assessment: Needs  assistance Sitting-balance support: No upper extremity supported, Feet supported Sitting balance-Leahy Scale: Normal     Standing balance support: Bilateral upper extremity supported, Reliant on assistive device for balance (RW)   Standing balance comment: no imbalance unsupported for static tasks while dressing, pt encouraged to use RW when ambulating to decrease pain and reduce fall risk, as he shows how he can take a couple steps at bedside without AD.                            Communication Communication Communication: No apparent difficulties  Cognition Arousal: Alert Behavior During Therapy: WFL for tasks assessed/performed   PT - Cognitive impairments: No apparent impairments                         Following commands: Intact      Cueing Cueing Techniques: Verbal cues, Visual cues  Exercises      General Comments General comments (skin integrity, edema, etc.): no acute s/sx distress other than c/o moderate to severe pain; RN notified      Pertinent Vitals/Pain Pain Assessment Pain Assessment: 0-10 Pain Score: 6  Pain Location: R knee Pain Descriptors / Indicators: Sore Pain Intervention(s): Monitored during session (RN notified of pt pain score; unsure if he wants pain meds or not)    Home Living                          Prior Function            PT Goals (current goals can now be found in the care plan section) Acute Rehab PT Goals Patient Stated Goal: to go home PT Goal Formulation: With patient Time For Goal Achievement: 04/01/24 Progress towards PT goals: Progressing toward goals    Frequency    Min 3X/week      PT Plan      Co-evaluation              AM-PAC PT 6 Clicks Mobility   Outcome Measure  Help needed turning from your back to your side while in a flat bed without using bedrails?: None Help needed moving from lying on your back to sitting on the side of a flat bed without using bedrails?:  None Help needed moving to and from a bed to a chair (including a wheelchair)?: None Help needed standing up from a chair using your arms (e.g., wheelchair or bedside chair)?: None Help needed to walk in hospital room?: A Little Help needed climbing 3-5 steps with a railing? : A Little 6 Click Score: 22    End of Session Equipment Utilized During Treatment: Right knee immobilizer;Gait belt Activity Tolerance: Patient tolerated treatment well Patient left: with call bell/phone within reach;Other (comment) (pt standing at bedside per his request, defers PTA waiting for him to sit down, DC RN notified as she is planning to return to room) Nurse Communication: Mobility status;Other (comment) (pt pain score, unsure if he wants meds; pt didn't think he was allowed to take iceman, but it is probably his, so RN can clarify) PT Visit Diagnosis: Other abnormalities of gait and mobility (R26.89);Muscle weakness (generalized) (M62.81);Pain Pain -  Right/Left: Right Pain - part of body: Knee     Time: 8765-8748 PT Time Calculation (min) (ACUTE ONLY): 17 min  Charges:    $Gait Training: 8-22 mins PT General Charges $$ ACUTE PT VISIT: 1 Visit                     Jubal Rademaker P., PTA Acute Rehabilitation Services Secure Chat Preferred 9a-5:30pm Office: [phone]    Connell HERO [facility] 03/20/2024, 1:20 PM

## 2024-03-20 NOTE — Progress Notes (Signed)
 "   [facility]  Date of Admission:  03/15/2024     Reason for Follow Up: Septic Bursitis   Total days of antibiotics 6         ASSESSMENT:  Mr. Cooksey is a 79 y/o male with history of bilateral quadriceps tendon repairs complicated by MSSA bacteremia and left knee septic arthritis s/p treatment in July 2025 presenting with swelling of his left knee and found to have septic bursitis with synovial fluid cultures negative.  Mr. Massenburg is POD #2 from bursectomy and right side quadriceps tendon repair with cultures reviewed and showing MSSA. Source control appears to be achieved and discussed plan of care to transition antibiotics to oral with Cefadroxil  for a total of 2 weeks from surgical intervention. Will arrange follow up in ID clinic. Post-operative care per Orthopedics. Ok for discharge from ID standpoint. Remaining medical and supportive care per Internal Medicine.   PLAN:  Change antibiotics to 1 g Cefadroxil  po BID for 2 weeks from surgery.  Post-operative wound care per Orthopedics.  Ok for discharge from ID standpoint and will arrange follow up in ID clinic.  Remaining medical and supportive care per Internal Medicine.   Principal Problem:   Septic joint of left knee joint (HCC) Active Problems:   Anxiety disorder   Depression   Hypertension   GERD (gastroesophageal reflux disease)   Coronary atherosclerosis   COPD, mild (HCC)   Mixed hyperlipidemia   Paroxysmal atrial fibrillation (HCC)   History of DVT (deep vein thrombosis)   Rupture of right quadriceps muscle   Prepatellar bursitis of left knee   Septic bursitis    aspirin   325 mg Oral Daily   atorvastatin   10 mg Oral Daily   buPROPion   150 mg Oral Daily   finasteride   5 mg Oral Daily   Influenza vac split trivalent PF  0.5 mL Intramuscular Tomorrow-1000   irbesartan   150 mg Oral Daily   lidocaine -EPINEPHrine   5 mL     pantoprazole   40 mg Oral Daily   polyethylene glycol  17 g  Oral BID   senna-docusate  2 tablet Oral QHS   sodium chloride  flush  3 mL Intravenous Q12H    SUBJECTIVE:  Afebrile overnight with no acute events. Tolerating antibiotics with no adverse side effects. Pain adequately controlled in bilateral knees.   Allergies[1]   Review of Systems: Review of Systems  Constitutional:  Negative for chills, fever and weight loss.  Respiratory:  Negative for cough, shortness of breath and wheezing.   Cardiovascular:  Negative for chest pain and leg swelling.  Gastrointestinal:  Negative for abdominal pain, constipation, diarrhea, nausea and vomiting.  Skin:  Negative for rash.      OBJECTIVE: Vitals:   03/19/24 1602 03/19/24 1940 03/20/24 0228 03/20/24 0752  BP: 115/74 122/74 134/70 129/69  Pulse: 73 74 69 73  Resp: 18 18 18 17   Temp: 98.4 F (36.9 C) 98.1 F (36.7 C) 98.4 F (36.9 C) 98 F (36.7 C)  TempSrc:  Oral Oral   SpO2: 96% 97% 99% 97%  Weight:      Height:       Body mass index is 26.45 kg/m.  Physical Exam Constitutional:      General: He is not in acute distress.    Appearance: He is well-developed.  Cardiovascular:     Rate and Rhythm: Normal rate and regular rhythm.     Heart sounds: Normal heart sounds.  Pulmonary:  Effort: Pulmonary effort is normal.     Breath sounds: Normal breath sounds.  Skin:    General: Skin is warm and dry.  Neurological:     Mental Status: He is alert.     Lab Results Lab Results  Component Value Date   WBC 8.8 03/20/2024   HGB 13.2 03/20/2024   HCT 38.6 (L) 03/20/2024   MCV 92.1 03/20/2024   PLT 229 03/20/2024    Lab Results  Component Value Date   CREATININE 0.78 03/20/2024   BUN 16 03/20/2024   NA 138 03/20/2024   K 3.9 03/20/2024   CL 104 03/20/2024   CO2 27 03/20/2024    Lab Results  Component Value Date   ALT 14 03/16/2024   AST 36 03/16/2024   ALKPHOS 46 03/16/2024   BILITOT 0.4 03/16/2024     Microbiology: Recent Results (from the past 240 hours)   Blood culture (routine x 2)     Status: None   Collection Time: 03/15/24 12:13 PM   Specimen: BLOOD  Result Value Ref Range Status   Specimen Description BLOOD RIGHT ANTECUBITAL  Final   Special Requests   Final    BOTTLES DRAWN AEROBIC AND ANAEROBIC Blood Culture adequate volume   Culture   Final    NO GROWTH 5 DAYS Performed at [facility], [address], [place], [place] [address]    Report Status 03/20/2024 FINAL  Final  Blood culture (routine x 2)     Status: None   Collection Time: 03/15/24 12:18 PM   Specimen: BLOOD  Result Value Ref Range Status   Specimen Description BLOOD LEFT ANTECUBITAL  Final   Special Requests   Final    BOTTLES DRAWN AEROBIC AND ANAEROBIC Blood Culture adequate volume   Culture   Final    NO GROWTH 5 DAYS Performed at [facility], [address], [place], [place] [address]    Report Status 03/20/2024 FINAL  Final  Body fluid culture w Gram Stain     Status: None   Collection Time: 03/15/24  3:54 PM   Specimen: Path fluid; Body Fluid  Result Value Ref Range Status   Specimen Description FLUID  Final   Special Requests SYNOVIAL,LEFT KNEE  Final   Gram Stain NO WBC SEEN NO ORGANISMS SEEN   Final   Culture   Final    NO GROWTH 3 DAYS Performed at [facility], [address], [place], [place] [address]    Report Status 03/18/2024 FINAL  Final  Aerobic/Anaerobic Culture w Gram Stain (surgical/deep wound)     Status: None (Preliminary result)   Collection Time: 03/17/24  5:51 PM   Specimen: Soft Tissue, Other  Result Value Ref Range Status   Specimen Description TISSUE LEFT KNEE  Final   Special Requests BURSA  Final   Gram Stain   Final    RARE WBC PRESENT, PREDOMINANTLY PMN NO ORGANISMS SEEN Performed at [facility], [address] [place], [place] [address]    Culture   Final    RARE STAPHYLOCOCCUS AUREUS NO ANAEROBES ISOLATED; CULTURE IN PROGRESS FOR 5 DAYS    Report Status PENDING  Incomplete    Organism ID, Bacteria STAPHYLOCOCCUS AUREUS  Final      Susceptibility   Staphylococcus aureus - MIC*    CIPROFLOXACIN  <=0.5 SENSITIVE Sensitive     ERYTHROMYCIN >=8 RESISTANT Resistant     GENTAMICIN  <=0.5 SENSITIVE Sensitive     OXACILLIN 0.5  SENSITIVE Sensitive     TETRACYCLINE <=1 SENSITIVE Sensitive     VANCOMYCIN  1 SENSITIVE Sensitive     TRIMETH /SULFA  <=10 SENSITIVE Sensitive     CLINDAMYCIN RESISTANT Resistant     RIFAMPIN <=0.5 SENSITIVE Sensitive     Inducible Clindamycin POSITIVE Resistant     LINEZOLID 2 SENSITIVE Sensitive     * RARE STAPHYLOCOCCUS AUREUS     Greg Darcee Dekker, NP [facility]  03/20/2024  9:49 AM     [1]  Allergies Allergen Reactions   Other Itching    PATCHES. Patient states that any patch on his skin causes him to itch    "

## 2024-03-22 LAB — AEROBIC/ANAEROBIC CULTURE W GRAM STAIN (SURGICAL/DEEP WOUND)

## 2024-03-23 ENCOUNTER — Telehealth: Payer: Self-pay | Admitting: *Deleted

## 2024-03-23 ENCOUNTER — Encounter (HOSPITAL_COMMUNITY): Payer: Self-pay | Admitting: Orthopaedic Surgery

## 2024-03-23 ENCOUNTER — Encounter (HOSPITAL_BASED_OUTPATIENT_CLINIC_OR_DEPARTMENT_OTHER): Admitting: Physical Therapy

## 2024-03-23 NOTE — Transitions of Care (Post Inpatient/ED Visit) (Signed)
" ° °  03/23/2024  Name: Paul [name]. MRN: [id number] DOB: [birth year]  Today's TOC FU Call Status: Today's TOC FU Call Status:: Unsuccessful Call (1st Attempt) Unsuccessful Call (1st Attempt) Date: 03/23/24  Attempted to reach the patient regarding the most recent Inpatient visit.  Left HIPAA compliant voice message   Follow Up Plan: Additional outreach attempts will be made to reach the patient to complete the Transitions of Care (Post Inpatient/ED visit) call.   Pls call/ message for questions,  Calie Buttrey Mckinney Kanesha Cadle, RN, BSN, CCRN Alumnus RN Care Manager  Transitions of Care  [facility] [phone]: direct office  "

## 2024-03-25 ENCOUNTER — Other Ambulatory Visit (HOSPITAL_BASED_OUTPATIENT_CLINIC_OR_DEPARTMENT_OTHER): Payer: Self-pay | Admitting: Orthopaedic Surgery

## 2024-03-25 ENCOUNTER — Encounter (HOSPITAL_BASED_OUTPATIENT_CLINIC_OR_DEPARTMENT_OTHER): Payer: Self-pay | Admitting: Physical Therapy

## 2024-03-25 ENCOUNTER — Other Ambulatory Visit: Payer: Self-pay

## 2024-03-25 ENCOUNTER — Ambulatory Visit (HOSPITAL_BASED_OUTPATIENT_CLINIC_OR_DEPARTMENT_OTHER): Attending: Orthopedic Surgery | Admitting: Physical Therapy

## 2024-03-25 ENCOUNTER — Telehealth: Payer: Self-pay | Admitting: *Deleted

## 2024-03-25 DIAGNOSIS — R29898 Other symptoms and signs involving the musculoskeletal system: Secondary | ICD-10-CM | POA: Insufficient documentation

## 2024-03-25 DIAGNOSIS — M75101 Unspecified rotator cuff tear or rupture of right shoulder, not specified as traumatic: Secondary | ICD-10-CM | POA: Diagnosis present

## 2024-03-25 DIAGNOSIS — M25562 Pain in left knee: Secondary | ICD-10-CM | POA: Insufficient documentation

## 2024-03-25 DIAGNOSIS — M6281 Muscle weakness (generalized): Secondary | ICD-10-CM | POA: Diagnosis present

## 2024-03-25 DIAGNOSIS — G8929 Other chronic pain: Secondary | ICD-10-CM | POA: Insufficient documentation

## 2024-03-25 DIAGNOSIS — M12811 Other specific arthropathies, not elsewhere classified, right shoulder: Secondary | ICD-10-CM | POA: Diagnosis present

## 2024-03-25 DIAGNOSIS — M25511 Pain in right shoulder: Secondary | ICD-10-CM | POA: Diagnosis present

## 2024-03-25 DIAGNOSIS — M25561 Pain in right knee: Secondary | ICD-10-CM | POA: Diagnosis present

## 2024-03-25 DIAGNOSIS — R2689 Other abnormalities of gait and mobility: Secondary | ICD-10-CM | POA: Diagnosis present
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
## 2024-03-25 NOTE — Transitions of Care (Post Inpatient/ED Visit) (Signed)
 "  03/25/2024  Name: Paul [name]. MRN: [id number] DOB: September 05, 1945  Today's TOC FU Call Status: Today's TOC FU Call Status:: Successful TOC FU Call Completed TOC FU Call Complete Date: 03/25/24  Patient's Name and Date of Birth confirmed. Name, DOB  Transition Care Management Follow-up Telephone Call Date of Discharge: 03/20/24 Discharge Facility: [facility]) Type of Discharge: Inpatient Admission Primary Inpatient Discharge Diagnosis:: Acute (L) knee pain; septic joint; surgical I/D with prepatellar bursal resection How have you been since you were released from the hospital?: Better (I am doing great!  Love this new careers adviser, very happy with him.  Back at work, even went to outpatient PT today and they were amazed at my progress; I surely don't need any additional calls- I am doing so well, not necessary) Any questions or concerns?: No  Items Reviewed: Did you receive and understand the discharge instructions provided?: Yes (thoroughly reviewed with patient who verbalizes good understanding of same) Medications obtained,verified, and reconciled?: Yes (Medications Reviewed) (Full medication reconciliation/ review completed; no concerns or discrepancies identified; confirmed patient obtained/ is taking all newly Rx'd medications as instructed; self-manages medications and denies questions/ concerns around medications today) Any new allergies since your discharge?: No Dietary orders reviewed?: Yes Type of Diet Ordered:: Healthy as possible Do you have support at home?: Yes People in Home [RPT]: spouse Name of Support/Comfort Primary Source: Reports independent in self-care activities; resides with supportive spouse- assists as/ if needed/ indicated  Medications Reviewed Today: Medications Reviewed Today     Reviewed by Gotham Raden M, RN (Registered Nurse) on 03/25/24 at 1225  Med List Status: <None>   Medication Order Taking? Sig Documenting Provider Last Dose Status  Informant  acetaminophen  (TYLENOL ) 500 MG tablet [id number] Yes Take 1 tablet (500 mg total) by mouth every 8 (eight) hours for 10 days. Genelle Standing, MD  Active   aspirin  EC 325 MG tablet [id number] Yes Take 1 tablet (325 mg total) by mouth daily. Genelle Standing, MD  Active   atorvastatin  (LIPITOR) 10 MG tablet [id number] Yes Take 1 tablet by mouth daily. [name], Aleksei V, MD  Active   buPROPion  (WELLBUTRIN  XL) 150 MG 24 hr tablet [id number] Yes Take 1 tablet by mouth daily. [name], Aleksei V, MD  Active Self, Pharmacy Records  cefadroxil  (DURICEF) 500 MG capsule [id number] Yes Take 2 capsules (1,000 mg total) by mouth 2 (two) times daily for 14 days. Take through 04/03/24 [place] Faith T, MD  Active   finasteride  (PROSCAR ) 5 MG tablet [id number] Yes Take 1 tablet by mouth daily. [name], Aleksei V, MD  Active Self, Pharmacy Records  ibuprofen  (ADVIL ) 800 MG tablet [id number] Yes Take 1 tablet (800 mg total) by mouth every 8 (eight) hours for 10 days. Please take with food, please alternate with acetaminophen  Genelle Standing, MD  Active   Multiple Vitamin (MULTIVITAMIN ADULT PO) [id number] Yes Take 3 capsules by mouth in the morning. [provider]  Active Self, Pharmacy Records  oxyCODONE  (ROXICODONE ) 5 MG immediate release tablet [id number] Yes Take 1 tablet (5 mg total) by mouth every 4 (four) hours as needed for severe pain (pain score 7-10) or breakthrough pain. Genelle Standing, MD  Active   pantoprazole  (PROTONIX ) 40 MG tablet [id number] Yes Take 1 tablet by mouth daily. Annual appointment due in October, must see provider for future refills. [name], Aleksei V, MD  Active Self, Pharmacy Records  testosterone  cypionate (DEPOTESTOSTERONE CYPIONATE) 200 MG/ML injection [id number] Yes Inject 0.5 mLs (100 mg total) into the  muscle once a week. [name], Aleksei V, MD  Active   valsartan  (DIOVAN ) 160 MG tablet [id number] Yes Take 1 tablet (160 mg total) by mouth daily. [name], Aleksei V, MD   Active   zolpidem  (AMBIEN ) 10 MG tablet [id number] Yes TAKE 1 TABLET BY MOUTH AT BEDTIME AS NEEDED [name], Aleksei V, MD  Active            Home Care and Equipment/Supplies: Were Home Health Services Ordered?: No Any new equipment or medical supplies ordered?: No  Functional Questionnaire: Do you need assistance with bathing/showering or dressing?: No Do you need assistance with meal preparation?: No Do you need assistance with eating?: No Do you have difficulty maintaining continence: No Do you need assistance with getting out of bed/getting out of a chair/moving?: No Do you have difficulty managing or taking your medications?: No  Follow up appointments reviewed: PCP Follow-up appointment confirmed?: Yes Date of PCP follow-up appointment?: 03/27/24 Follow-up Provider: PCP Specialist Hospital Follow-up appointment confirmed?: Yes Date of Specialist follow-up appointment?: 04/03/24 Follow-Up Specialty Provider:: 04/03/24- with surgical provider; 04/06/24- with ID provider Do you need transportation to your follow-up appointment?: No Do you understand care options if your condition(s) worsen?: Yes-patient verbalized understanding  SDOH Interventions Today    Flowsheet Row Most Recent Value  SDOH Interventions   Food Insecurity Interventions Intervention Not Indicated  Housing Interventions Intervention Not Indicated  Transportation Interventions Intervention Not Indicated  [drives self as per baseline]  Utilities Interventions Intervention Not Indicated   See TOC assessment tabs for additional assessment/ TOC intervention information Reinforced education re: signs/ symptoms surgical incision- wound infection along with corresponding action plan: patient able to verbalize with minimal prompting Reinforced education re: common side effects of narcotic pain medicine; need to use pain medicine as prescribed/ as needed; safe use of opioid medication; strategies to prevent  constipation  Patient declines need for ongoing/ further care management/ coordination outreach; declines enrollment in 30-day TOC program- declines taking my direct phone number should needs/ concerns arise post-TOC call   Pls call/ message for questions,  Leory Allinson Mckinney Miasia Crabtree, RN, BSN, Media Planner  Transitions of Care  [facility]  [place] [phone]: direct office  "

## 2024-03-26 ENCOUNTER — Ambulatory Visit: Admitting: Internal Medicine

## 2024-03-26 ENCOUNTER — Other Ambulatory Visit (HOSPITAL_BASED_OUTPATIENT_CLINIC_OR_DEPARTMENT_OTHER): Payer: Self-pay

## 2024-03-26 ENCOUNTER — Encounter: Payer: Self-pay | Admitting: Internal Medicine

## 2024-03-26 VITALS — BP 136/74 | HR 70 | Ht 72.0 in | Wt 201.6 lb

## 2024-03-26 DIAGNOSIS — F419 Anxiety disorder, unspecified: Secondary | ICD-10-CM | POA: Diagnosis not present

## 2024-03-26 DIAGNOSIS — S76112D Strain of left quadriceps muscle, fascia and tendon, subsequent encounter: Secondary | ICD-10-CM | POA: Diagnosis not present

## 2024-03-26 DIAGNOSIS — K2100 Gastro-esophageal reflux disease with esophagitis, without bleeding: Secondary | ICD-10-CM

## 2024-03-26 DIAGNOSIS — M5442 Lumbago with sciatica, left side: Secondary | ICD-10-CM | POA: Diagnosis not present

## 2024-03-26 DIAGNOSIS — M5441 Lumbago with sciatica, right side: Secondary | ICD-10-CM | POA: Diagnosis not present

## 2024-03-26 DIAGNOSIS — G8929 Other chronic pain: Secondary | ICD-10-CM | POA: Diagnosis not present

## 2024-03-26 DIAGNOSIS — S76119D Strain of unspecified quadriceps muscle, fascia and tendon, subsequent encounter: Secondary | ICD-10-CM

## 2024-03-26 DIAGNOSIS — M25562 Pain in left knee: Secondary | ICD-10-CM

## 2024-03-26 DIAGNOSIS — F32A Depression, unspecified: Secondary | ICD-10-CM

## 2024-03-26 MED ORDER — VALSARTAN 160 MG PO TABS
160.0000 mg | ORAL_TABLET | Freq: Every day | ORAL | 3 refills | Status: AC
  Filled 2024-03-26: qty 90, 90d supply, fill #0

## 2024-03-26 MED ORDER — ATORVASTATIN CALCIUM 10 MG PO TABS
10.0000 mg | ORAL_TABLET | Freq: Every day | ORAL | 2 refills | Status: AC
  Filled 2024-03-26: qty 90, 90d supply, fill #0

## 2024-03-26 MED ORDER — PANTOPRAZOLE SODIUM 40 MG PO TBEC
40.0000 mg | DELAYED_RELEASE_TABLET | Freq: Every day | ORAL | 3 refills | Status: AC
  Filled 2024-03-26: qty 90, 90d supply, fill #0

## 2024-03-26 MED ORDER — BUPROPION HCL ER (XL) 150 MG PO TB24
150.0000 mg | ORAL_TABLET | Freq: Every day | ORAL | 1 refills | Status: AC
  Filled 2024-03-26: qty 90, 90d supply, fill #0

## 2024-03-26 MED ORDER — HYDROCODONE-ACETAMINOPHEN 10-325 MG PO TABS
1.0000 | ORAL_TABLET | Freq: Four times a day (QID) | ORAL | 0 refills | Status: AC | PRN
  Filled 2024-03-26: qty 20, 5d supply, fill #0

## 2024-03-26 NOTE — Assessment & Plan Note (Addendum)
 Norco for leg pain  Potential benefits of a short/long term opioids use as well as potential risks (i.e. addiction risk, apnea etc) and complications (i.e. Somnolence, constipation and others) were explained to the patient and were aknowledged.  Seeing Dr Genelle S/p recent surgery on R quad and L knee bursa

## 2024-03-26 NOTE — Patient Instructions (Addendum)
 Kefir Florastor  Recent large-scale observational studies provide strong evidence that the shingles vaccine is associated with a reduced risk of developing dementia. It may also help slow cognitive decline in individuals who already have dementia.  Key Findings from Recent Research Multiple natural experiment studies, which leveraged unique vaccination policies to minimize bias, have consistently found a protective link:  Reduced Risk: One large study, published in [employer], analyzed health records from over 280,000 older adults in [place] and found that those who received the live-attenuated shingles vaccine (Zostavax, now discontinued in the [place] ) were approximately 20% less likely to develop dementia over a seven-year follow-up period. Slower Progression: A follow-up study published in Cell indicated that for those already living with dementia, the vaccine appeared to slow the progression of the disease and reduced dementia-related deaths by nearly half over nine years. Vascular Dementia Protection: Other research presented at [facility] 2025 indicated that the vaccine lowered the risk of vascular dementia by 50%. Newer Vaccine: A separate study published in [employer] suggested that the newer, more effective recombinant vaccine (Shingrix, currently used in the [place] ) also offers significant protection against dementia, with an association of lower risk than the older vaccine type. Stronger Effect in Women: Several studies noted that the protective effect against dementia was more pronounced in women than in men, possibly due to differences in immune responses or dementia pathogenesis.  Why Might the Vaccine Help? Researchers are still working to determine the exact mechanism, but current theories center on the idea that preventing the shingles virus (varicella-zoster) from reactivating helps protect the brain:  Reduced Inflammation: The most likely explanation is that the vaccine prevents shingles  infections and subsequent inflammation in the nervous system, which is a known risk factor for neurodegenerative diseases like dementia. Immune System Modulation: It's also possible that the vaccine boosts the immune system more broadly, helping the body combat other processes related to cognitive decline.  Important Considerations Observational Data: While the evidence is strong, these findings primarily come from observational studies, which show an association, not a definitive cause-and-effect relationship. A large-scale randomized controlled trial is the gold standard for conclusive proof, but such trials are difficult to conduct for dementia due to the time and cost involved. Public Health Recommendation: The [place]. Centers for Disease Control and Prevention (CDC) already recommends the two-dose Shingrix vaccine for all healthy adults aged 15 and older primarily to prevent shingles and its painful complications. The potential added benefit for dementia prevention provides another compelling reason to get vaccinated.

## 2024-03-26 NOTE — Assessment & Plan Note (Addendum)
" °  Paul [name] is status post complete left knee bursectomy to eliminate a nidus for infection.  He is status post right knee there quadriceps repair of a full-thickness quadriceps rerupture by Dr. Genelle.  He is very happy with the results B legs w/post-op wounds R leg brace "

## 2024-03-26 NOTE — Progress Notes (Signed)
 "  Subjective:  Patient ID: Paul [name]., male    DOB: 02/15/1946  Age: 79 y.o. MRN: [id number]  CC: Medical Management of Chronic Issues (3 Month follow up)   HPI [employer]. presents for recent leg infection F/u on LBP - worse F/u on GERD  Paul [name] is status post complete left knee bursectomy to eliminate a nidus for infection.  He is status post right knee there quadriceps repair of a full-thickness quadriceps rerupture by Dr. Genelle.  He is very happy with the results    Admit date:     03/15/2024  Discharge date: 03/20/2024  Discharge Physician: Elgie Butter    PCP: Garald [name], MD    Recommendations at discharge:  Please follow up with ID and orthopedics as recommended.    Discharge Diagnoses: Principal Problem:   Septic joint of left knee joint (HCC) Active Problems:   Anxiety disorder   Depression   Hypertension   GERD (gastroesophageal reflux disease)   Coronary atherosclerosis   COPD, mild (HCC)   Mixed hyperlipidemia   Paroxysmal atrial fibrillation (HCC)   History of DVT (deep vein thrombosis)   Rupture of right quadriceps muscle   Prepatellar bursitis of left knee   Septic bursitis   Resolved Problems:   * No resolved hospital problems. *   Hospital Course: 79 year old male with hypertension, HLP, GERD, A-fib, CAD, COPD, history of DVT, BPH, low back pain presented with left knee swelling and pain.  Patient reported multiple issues with his left leg/knee this year.  Earlier in the year, had a knee surgery following an initial injury in February 2025 when he fell while walking his dog..  After the fall, was found to have bilateral quadriceps tendon ruptures, which was fixed with Dr. Beverley.  Subsequently, had infection of the knee and has undergone 3 separate washouts.  Most recently in July and that time he also had bacteremia.   He has been followed up by infectious disease and completed a course of antibiotics in August via PICC line.   Had been doing well. Followed up with Dr Genelle orthopedics on 02/28/2024 and there was concern for bursitis and was also some concern for right quad tear.  He was started on steroids and antibiotics.  He repeated this course but I continue worsening of his swelling and pain in his left knee.  Reports he noticed some pus draining from the left knee as well.     Assessment and Plan:  Suspected septic joint of left knee joint (HCC),  - history of recurrent knee infection, status post washout x 3, most recently treated for septic arthritis and bacteremia in July 2025.  Completed course of antibiotics via PICC line in August -MRI left knee showed complex tear involving the posterior horn and mid body region of the medial meniscus and macerated appearance.  Intact ligamentous structures - Ortho consulted, patient underwent right quadriceps tendon repair with Achilles allograft as well as left knee prepatellar bursal resection.  ID on board for antibiotics,  recommended cefadroxil  to complete the course.   Therapy eval recommending outpatient PT.    Hypertension Well controlled BP parameters.    CAD - Continue ARB, statin   Hyperlipidemia Continue with statin.    GERD - Continue home PPI   Paroxysmal atrial fibrillation Not on meds at this time.    Depression Anxiety - Continue Wellbutrin    BPH - Continue finasteride    History of DVT  - Noted, not on  any anticoagulation   COPD Stable. No sob.    Constipation: Increased the miralax  to BID.  Add dulcolax suppository if no BM today.     Outpatient Medications Prior to Visit  Medication Sig Dispense Refill   acetaminophen  (TYLENOL ) 500 MG tablet Take 1 tablet (500 mg total) by mouth every 8 (eight) hours for 10 days. 30 tablet 0   aspirin  EC 325 MG tablet Take 1 tablet (325 mg total) by mouth daily. 14 tablet 0   cefadroxil  (DURICEF) 500 MG capsule Take 2 capsules (1,000 mg total) by mouth 2 (two) times daily for 14 days.  Take through 04/03/24 56 capsule 0   finasteride  (PROSCAR ) 5 MG tablet Take 1 tablet by mouth daily. 90 tablet 3   ibuprofen  (ADVIL ) 800 MG tablet Take 1 tablet (800 mg total) by mouth every 8 (eight) hours for 10 days. Please take with food, please alternate with acetaminophen  30 tablet 0   Multiple Vitamin (MULTIVITAMIN ADULT PO) Take 3 capsules by mouth in the morning.     testosterone  cypionate (DEPOTESTOSTERONE CYPIONATE) 200 MG/ML injection Inject 0.5 mLs (100 mg total) into the muscle once a week. 10 mL 1   zolpidem  (AMBIEN ) 10 MG tablet TAKE 1 TABLET BY MOUTH AT BEDTIME AS NEEDED 90 tablet 1   atorvastatin  (LIPITOR) 10 MG tablet Take 1 tablet by mouth daily. 90 tablet 2   buPROPion  (WELLBUTRIN  XL) 150 MG 24 hr tablet Take 1 tablet by mouth daily. 90 tablet 1   oxyCODONE  (ROXICODONE ) 5 MG immediate release tablet Take 1 tablet (5 mg total) by mouth every 4 (four) hours as needed for severe pain (pain score 7-10) or breakthrough pain. 15 tablet 0   pantoprazole  (PROTONIX ) 40 MG tablet Take 1 tablet by mouth daily. Annual appointment due in October, must see provider for future refills. 90 tablet 3   valsartan  (DIOVAN ) 160 MG tablet Take 1 tablet (160 mg total) by mouth daily. 90 tablet 3   No facility-administered medications prior to visit.    ROS: Review of Systems  Constitutional:  Positive for fatigue. Negative for appetite change and unexpected weight change.  HENT:  Negative for congestion, nosebleeds, sneezing, sore throat and trouble swallowing.   Eyes:  Negative for itching and visual disturbance.  Respiratory:  Negative for cough.   Cardiovascular:  Negative for chest pain, palpitations and leg swelling.  Gastrointestinal:  Negative for abdominal distention, blood in stool, diarrhea and nausea.  Genitourinary:  Negative for frequency and hematuria.  Musculoskeletal:  Positive for arthralgias and gait problem. Negative for back pain and neck pain.  Skin:  Positive for wound.  Negative for rash.  Neurological:  Negative for dizziness, tremors, speech difficulty and weakness.  Psychiatric/Behavioral:  Positive for dysphoric mood. Negative for agitation, sleep disturbance and suicidal ideas. The patient is not nervous/anxious.     Objective:  BP 136/74   Pulse 70   Ht 6' (1.829 m)   Wt 201 lb 9.6 oz (91.4 kg)   SpO2 98%   BMI 27.34 kg/m   BP Readings from Last 3 Encounters:  03/26/24 136/74  03/20/24 129/69  01/25/24 (!) 159/93    Wt Readings from Last 3 Encounters:  03/26/24 201 lb 9.6 oz (91.4 kg)  03/15/24 195 lb (88.5 kg)  01/25/24 191 lb 12.8 oz (87 kg)    Physical Exam Constitutional:      General: He is not in acute distress.    Appearance: Normal appearance. He is well-developed.  Comments: NAD  Eyes:     Conjunctiva/sclera: Conjunctivae normal.     Pupils: Pupils are equal, round, and reactive to light.  Neck:     Thyroid : No thyromegaly.     Vascular: No JVD.  Cardiovascular:     Rate and Rhythm: Normal rate and regular rhythm.     Heart sounds: Normal heart sounds. No murmur heard.    No friction rub. No gallop.  Pulmonary:     Effort: Pulmonary effort is normal. No respiratory distress.     Breath sounds: Normal breath sounds. No wheezing or rales.  Chest:     Chest wall: No tenderness.  Abdominal:     General: Bowel sounds are normal. There is no distension.     Palpations: Abdomen is soft. There is no mass.     Tenderness: There is no abdominal tenderness. There is no guarding or rebound.  Musculoskeletal:        General: Tenderness present. Normal range of motion.     Cervical back: Normal range of motion.     Right lower leg: No edema.     Left lower leg: No edema.  Lymphadenopathy:     Cervical: No cervical adenopathy.  Skin:    General: Skin is warm and dry.     Findings: No rash.  Neurological:     Mental Status: He is alert and oriented to person, place, and time.     Cranial Nerves: No cranial nerve  deficit.     Motor: No abnormal muscle tone.     Coordination: Coordination normal.     Gait: Gait abnormal.     Deep Tendon Reflexes: Reflexes are normal and symmetric.  Psychiatric:        Behavior: Behavior normal.        Thought Content: Thought content normal.        Judgment: Judgment normal.   B legs w/post-op wounds R leg brace   Lab Results  Component Value Date   WBC 8.8 03/20/2024   HGB 13.2 03/20/2024   HCT 38.6 (L) 03/20/2024   PLT 229 03/20/2024   GLUCOSE 95 03/20/2024   CHOL 190 05/31/2022   TRIG 144.0 05/31/2022   HDL 57.80 05/31/2022   LDLDIRECT 96.0 05/30/2020   LDLCALC 103 (H) 05/31/2022   ALT 14 03/16/2024   AST 36 03/16/2024   NA 138 03/20/2024   K 3.9 03/20/2024   CL 104 03/20/2024   CREATININE 0.78 03/20/2024   BUN 16 03/20/2024   CO2 27 03/20/2024   TSH 3.62 10/14/2023   PSA 0.16 05/31/2022   INR 1.0 08/12/2023    MR KNEE RIGHT WO CONTRAST Result Date: 03/16/2024 CLINICAL DATA:  Pain. Concern for quadriceps re-tear. 10 months status post repair of bilateral quadriceps ruptures. EXAM: MRI OF THE RIGHT KNEE WITHOUT CONTRAST TECHNIQUE: Multiplanar, multisequence MR imaging of the knee was performed. No intravenous contrast was administered. COMPARISON:  MRI right knee dated 04/29/2023. FINDINGS: MENISCI Medial: Redemonstrated complex tear of the body and posterior horn of the medial meniscus with extrusion of the body into the medial gutter. Similar 5 mm low signal structure within the medial gutter along the undersurface of the body of the medial meniscus likely represents a displaced meniscal fragment. Lateral: Horizontal tear of the body and posterior horn of the lateral meniscus with degeneration. LIGAMENTS Cruciates: ACL and PCL are intact. Collaterals: Medial collateral ligament is intact. Lateral collateral ligament complex is intact. CARTILAGE Patellofemoral: Moderate partial-thickness cartilage loss of the medial  patellar facet. Medial: Mild  partial-thickness cartilage loss of the medial femorotibial compartment. Lateral: Mild partial-thickness cartilage loss of the lateral femorotibial compartment. JOINT: Trace joint effusion. Normal Hoffa's fat-pad. No plical thickening. POPLITEAL FOSSA: Popliteus tendon is intact. Tiny Baker's cyst. EXTENSOR MECHANISM: Postoperative changes related to interval repair of distal quadriceps tendon rupture with evidence of complete re-tear of the distal quadriceps tendon from the insertion on the superior pole of the patella with approximately 2.8 cm of tendon retraction. Intact patellar tendon. Intact lateral patellar retinaculum. Intact medial patellar retinaculum. BONES: No fracture or dislocation. Similar mild lateral shift of the tibia relative to the femur. No aggressive osseous lesion. Other: No fluid collection or hematoma. IMPRESSION: 1. Complete re-tear of the previously repaired right distal quadriceps tendon from the insertion on the superior patellar pole with approximately 2.8 cm of tendon retraction. 2. Redemonstrated complex tear of the body and posterior horn of the medial meniscus with extrusion of the body into the medial gutter. Similar suspected 5 mm displaced meniscal fragment within the medial gutter along the undersurface of the body of the medial meniscus. 3. Horizontal tear of the body and posterior horn of the lateral meniscus with degeneration. 4. Tricompartmental osteoarthritis with cartilage abnormalities, as described above. 5. Similar mild lateral shift of the tibia relative to the femur. 6. Trace knee joint effusion. Electronically Signed   By: Harrietta Sherry M.D.   On: 03/16/2024 14:51   MR KNEE LEFT W WO CONTRAST Result Date: 03/15/2024 CLINICAL DATA:  Knee pain and swelling. History of prior knee surgeries. EXAM: MRI OF THE LEFT KNEE WITHOUT AND WITH CONTRAST TECHNIQUE: Multiplanar, multisequence MR imaging of the knee was performed before and after the administration of  intravenous contrast. CONTRAST:  9mL GADAVIST  GADOBUTROL  1 MMOL/ML IV SOLN COMPARISON:  MRI 08/26/2023 FINDINGS: MENISCI Medial meniscus: Complex tear involving the posterior horn and mid body region with a macerated appearance. This is new since the prior MRI examination. Lateral meniscus:  Intact LIGAMENTS Cruciates:  Intact.  Mucoid degeneration of the ACL. Collaterals:  Intact. CARTILAGE Patellofemoral: Significant degenerative chondrosis/chondromalacia with areas of full-thickness cartilage loss. There is also moderate edema like signal changes in the patella. Medial: Severe and progressive medial compartment degenerative changes with full-thickness cartilage loss, joint space narrowing, marrow edema and subchondral cystic change. Lateral: Moderate to advanced degenerative chondrosis progressive since prior study. Joint:  Small joint effusion and mild synovitis. Popliteal Fossa:  No popliteal mass.  Very small Baker's cyst. Extensor Mechanism: The patella retinacular structures are intact. There is marked thickening of the lateral retinaculum possibly related to prior surgical repair with fibrosis. Severe chronic tendinopathy involving the quadriceps tendon which has been surgically repaired. Partial-thickness tearing of the tendon medially. Bones: Moderate diffuse edema like signal changes involving the patella possibly reactive related to the quadriceps disease. Multiple small bone anchors are noted in the patella. Other: Mild subcutaneous inflammation/edema and enhancement. There is also moderate enhancement of the supra tele joint space medially likely synovitis. IMPRESSION: 1. Complex tear involving the posterior horn and mid body region of the medial meniscus with a macerated appearance. This is new since the prior MRI examination. 2. Intact ligamentous structures. 3. Significant and progressive tricompartmental degenerative changes most severe in the medial compartment. 4. Severe chronic tendinopathy  involving the quadriceps tendon which has been surgically repaired. Partial-thickness tearing of the tendon medially. 5. Small joint effusion and mild synovitis. 6. Diffuse marrow edema involving the patella likely stress related process. Electronically Signed  By: MYRTIS Stammer M.D.   On: 03/15/2024 22:00   DG Knee 2 Views Left Result Date: 03/15/2024 CLINICAL DATA:  Knee pain, known staph infection of left knee. EXAM: LEFT KNEE - 1-2 VIEW COMPARISON:  08/12/2023. FINDINGS: No acute fracture or dislocation is seen. No bony erosions or periosteal elevation. There is a small suprapatellar joint effusion. Mild degenerative changes are noted in the medial patellofemoral compartments. A dystrophic calcification is noted in the soft tissues, likely related to prior trauma. Soft tissue swelling is present anterior to the knee. Vascular calcifications are noted in the soft tissues. IMPRESSION: 1. No acute osseous abnormality. 2. Soft tissue swelling anterior to the knee and small joint effusion. Electronically Signed   By: Leita Birmingham M.D.   On: 03/15/2024 15:39    Assessment & Plan:   Problem List Items Addressed This Visit     Anxiety disorder   Situational depression/ anxiety to to a lot of medical and surgical illness lately - worse On Wellbutrin  XL      Relevant Medications   buPROPion  (WELLBUTRIN  XL) 150 MG 24 hr tablet   Depression   Situational depression to to a lot of medical and surgical illness lately-better Paul [name] is status post complete left knee bursectomy to eliminate a nidus for infection.  He is status post right knee there quadriceps repair of a full-thickness quadriceps rerupture by Dr. Genelle.  He is very happy with the results On Wellbutrin  XL      Relevant Medications   buPROPion  (WELLBUTRIN  XL) 150 MG 24 hr tablet   Chronic low back pain - Primary   Norco for leg pain  Potential benefits of a short/long term opioids use as well as potential risks (i.e. addiction risk,  apnea etc) and complications (i.e. Somnolence, constipation and others) were explained to the patient and were aknowledged.  Seeing Dr Genelle S/p recent surgery on R quad and L knee bursa      Relevant Medications   buPROPion  (WELLBUTRIN  XL) 150 MG 24 hr tablet   HYDROcodone -acetaminophen  (NORCO) 10-325 MG tablet   GERD (gastroesophageal reflux disease)   On Protonix   Potential benefits of a long term PPI use as well as potential risks  and complications were explained to the patient and were aknowledged.      Relevant Medications   pantoprazole  (PROTONIX ) 40 MG tablet   Knee pain, left    Paul [name] is status post complete left knee bursectomy to eliminate a nidus for infection.  He is status post right knee there quadriceps repair of a full-thickness quadriceps rerupture by Dr. Genelle.  He is very happy with the results B legs w/post-op wounds R leg brace      Quadriceps tendon rupture    Paul [name] is status post complete left knee bursectomy to eliminate a nidus for infection.  He is status post right knee there quadriceps repair of a full-thickness quadriceps rerupture by Dr. Genelle.  He is very happy with the results         Meds ordered this encounter  Medications   atorvastatin  (LIPITOR) 10 MG tablet    Sig: Take 1 tablet (10 mg total) by mouth daily.    Dispense:  90 tablet    Refill:  2   buPROPion  (WELLBUTRIN  XL) 150 MG 24 hr tablet    Sig: Take 1 tablet (150 mg total) by mouth daily.    Dispense:  90 tablet    Refill:  1   pantoprazole  (PROTONIX ) 40 MG tablet  Sig: Take 1 tablet (40 mg total) by mouth daily.    Dispense:  90 tablet    Refill:  3   valsartan  (DIOVAN ) 160 MG tablet    Sig: Take 1 tablet (160 mg total) by mouth daily.    Dispense:  90 tablet    Refill:  3   HYDROcodone -acetaminophen  (NORCO) 10-325 MG tablet    Sig: Take 1 tablet by mouth every 6 (six) hours as needed for up to 5 days for severe pain (pain score 7-10).    Dispense:  20 tablet     Refill:  0      Follow-up: Return in about 3 months (around 06/24/2024) for a follow-up visit.  Marolyn Noel, MD "

## 2024-03-26 NOTE — Assessment & Plan Note (Signed)
 Situational depression/ anxiety to to a lot of medical and surgical illness lately - worse On Wellbutrin  XL

## 2024-03-29 NOTE — Assessment & Plan Note (Signed)
" °  Paul [name] is status post complete left knee bursectomy to eliminate a nidus for infection.  He is status post right knee there quadriceps repair of a full-thickness quadriceps rerupture by Dr. Genelle.  He is very happy with the results "

## 2024-03-29 NOTE — Assessment & Plan Note (Signed)
On Protonix  Potential benefits of a long term PPI use as well as potential risks  and complications were explained to the patient and were aknowledged. 

## 2024-03-29 NOTE — Assessment & Plan Note (Signed)
 Situational depression to to a lot of medical and surgical illness lately-better Paul [name] is status post complete left knee bursectomy to eliminate a nidus for infection.  He is status post right knee there quadriceps repair of a full-thickness quadriceps rerupture by Dr. Genelle.  He is very happy with the results On Wellbutrin  XL

## 2024-03-30 ENCOUNTER — Other Ambulatory Visit (HOSPITAL_BASED_OUTPATIENT_CLINIC_OR_DEPARTMENT_OTHER): Payer: Self-pay

## 2024-03-31 ENCOUNTER — Other Ambulatory Visit (HOSPITAL_BASED_OUTPATIENT_CLINIC_OR_DEPARTMENT_OTHER): Payer: Self-pay

## 2024-03-31 ENCOUNTER — Encounter (HOSPITAL_BASED_OUTPATIENT_CLINIC_OR_DEPARTMENT_OTHER): Payer: Self-pay | Admitting: Orthopaedic Surgery

## 2024-04-01 ENCOUNTER — Encounter (HOSPITAL_BASED_OUTPATIENT_CLINIC_OR_DEPARTMENT_OTHER): Admitting: Physical Therapy

## 2024-04-02 ENCOUNTER — Ambulatory Visit (HOSPITAL_BASED_OUTPATIENT_CLINIC_OR_DEPARTMENT_OTHER): Payer: Self-pay | Admitting: Physical Therapy

## 2024-04-02 ENCOUNTER — Encounter (HOSPITAL_BASED_OUTPATIENT_CLINIC_OR_DEPARTMENT_OTHER): Payer: Self-pay | Admitting: Physical Therapy

## 2024-04-02 DIAGNOSIS — M6281 Muscle weakness (generalized): Secondary | ICD-10-CM

## 2024-04-02 DIAGNOSIS — M25562 Pain in left knee: Secondary | ICD-10-CM | POA: Diagnosis not present

## 2024-04-02 DIAGNOSIS — R29898 Other symptoms and signs involving the musculoskeletal system: Secondary | ICD-10-CM

## 2024-04-02 DIAGNOSIS — R2689 Other abnormalities of gait and mobility: Secondary | ICD-10-CM

## 2024-04-02 DIAGNOSIS — G8929 Other chronic pain: Secondary | ICD-10-CM
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
## 2024-04-03 ENCOUNTER — Other Ambulatory Visit (HOSPITAL_BASED_OUTPATIENT_CLINIC_OR_DEPARTMENT_OTHER): Payer: Self-pay

## 2024-04-03 ENCOUNTER — Ambulatory Visit (INDEPENDENT_AMBULATORY_CARE_PROVIDER_SITE_OTHER): Payer: Self-pay | Admitting: Orthopaedic Surgery

## 2024-04-03 ENCOUNTER — Other Ambulatory Visit: Payer: Self-pay

## 2024-04-03 DIAGNOSIS — S76119A Strain of unspecified quadriceps muscle, fascia and tendon, initial encounter: Secondary | ICD-10-CM

## 2024-04-03 MED ORDER — OXYCODONE HCL 5 MG PO TABS
5.0000 mg | ORAL_TABLET | ORAL | 0 refills | Status: AC | PRN
  Filled 2024-04-03: qty 15, 3d supply, fill #0
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
## 2024-04-06 ENCOUNTER — Inpatient Hospital Stay: Admitting: Family

## 2024-04-06 ENCOUNTER — Ambulatory Visit: Admitting: Family Medicine

## 2024-04-06 ENCOUNTER — Encounter: Payer: Self-pay | Admitting: Family Medicine

## 2024-04-07 ENCOUNTER — Ambulatory Visit: Admitting: Family Medicine

## 2024-04-07 ENCOUNTER — Other Ambulatory Visit: Payer: Self-pay

## 2024-04-07 ENCOUNTER — Encounter: Payer: Self-pay | Admitting: Family Medicine

## 2024-04-07 ENCOUNTER — Encounter (HOSPITAL_BASED_OUTPATIENT_CLINIC_OR_DEPARTMENT_OTHER): Payer: Self-pay | Admitting: Orthopaedic Surgery

## 2024-04-07 VITALS — BP 140/80 | HR 63 | Ht 72.0 in | Wt 209.0 lb

## 2024-04-07 DIAGNOSIS — M75111 Incomplete rotator cuff tear or rupture of right shoulder, not specified as traumatic: Secondary | ICD-10-CM | POA: Diagnosis not present

## 2024-04-07 DIAGNOSIS — M12811 Other specific arthropathies, not elsewhere classified, right shoulder: Secondary | ICD-10-CM

## 2024-04-07 DIAGNOSIS — M19019 Primary osteoarthritis, unspecified shoulder: Secondary | ICD-10-CM | POA: Insufficient documentation

## 2024-04-07 DIAGNOSIS — M19011 Primary osteoarthritis, right shoulder: Secondary | ICD-10-CM | POA: Diagnosis not present

## 2024-04-07 DIAGNOSIS — M75101 Unspecified rotator cuff tear or rupture of right shoulder, not specified as traumatic: Secondary | ICD-10-CM

## 2024-04-07 DIAGNOSIS — M25511 Pain in right shoulder: Secondary | ICD-10-CM | POA: Diagnosis not present

## 2024-04-07 DIAGNOSIS — G8929 Other chronic pain: Secondary | ICD-10-CM

## 2024-04-07 NOTE — Assessment & Plan Note (Signed)
 I believe the patient does have an acute on chronic tear and some rotator cuff arthropathy at this point.  Discussed with patient that I do think that this happened with patient having to do a lot more upper body strength secondary to his recent surgery on the lower extremities.  Discussed icing regimen and home exercises, discussed which activities to do and which ones to avoid.  Increase activity slowly.  Discussed home exercises.  Follow-up again in 6 to 8 weeks

## 2024-04-07 NOTE — Assessment & Plan Note (Signed)
 Injection given today and tolerated the procedure well, discussed icing regimen and home exercises, discussed which activities to do and which ones to avoid.  Follow-up again in 6 to 12 weeks

## 2024-04-07 NOTE — Patient Instructions (Addendum)
 Injection in shoulder today Good to see you! PT referral [facility] Keep hands within peripheral vision Choline 500mg  daily CoQ10 200mg  daily See you again in 2 months
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
## 2024-04-08 ENCOUNTER — Encounter (HOSPITAL_BASED_OUTPATIENT_CLINIC_OR_DEPARTMENT_OTHER): Admitting: Physical Therapy

## 2024-04-10 ENCOUNTER — Ambulatory Visit (HOSPITAL_BASED_OUTPATIENT_CLINIC_OR_DEPARTMENT_OTHER): Payer: Self-pay | Admitting: Physical Therapy

## 2024-04-10 ENCOUNTER — Other Ambulatory Visit (HOSPITAL_BASED_OUTPATIENT_CLINIC_OR_DEPARTMENT_OTHER): Payer: Self-pay

## 2024-04-10 ENCOUNTER — Encounter (HOSPITAL_BASED_OUTPATIENT_CLINIC_OR_DEPARTMENT_OTHER): Payer: Self-pay

## 2024-04-10 ENCOUNTER — Encounter (HOSPITAL_BASED_OUTPATIENT_CLINIC_OR_DEPARTMENT_OTHER): Payer: Self-pay | Admitting: Physical Therapy

## 2024-04-10 ENCOUNTER — Ambulatory Visit (INDEPENDENT_AMBULATORY_CARE_PROVIDER_SITE_OTHER): Admitting: Orthopaedic Surgery

## 2024-04-10 DIAGNOSIS — M25511 Pain in right shoulder: Secondary | ICD-10-CM

## 2024-04-10 DIAGNOSIS — S76119A Strain of unspecified quadriceps muscle, fascia and tendon, initial encounter: Secondary | ICD-10-CM

## 2024-04-10 DIAGNOSIS — M6281 Muscle weakness (generalized): Secondary | ICD-10-CM

## 2024-04-10 DIAGNOSIS — R2689 Other abnormalities of gait and mobility: Secondary | ICD-10-CM

## 2024-04-10 DIAGNOSIS — M25562 Pain in left knee: Secondary | ICD-10-CM | POA: Diagnosis not present

## 2024-04-10 DIAGNOSIS — R29898 Other symptoms and signs involving the musculoskeletal system: Secondary | ICD-10-CM

## 2024-04-10 DIAGNOSIS — G8929 Other chronic pain: Secondary | ICD-10-CM

## 2024-04-10 MED ORDER — SULFAMETHOXAZOLE-TRIMETHOPRIM 800-160 MG PO TABS
1.0000 | ORAL_TABLET | Freq: Two times a day (BID) | ORAL | 0 refills | Status: DC
  Filled 2024-04-10: qty 8, 4d supply, fill #0

## 2024-04-10 NOTE — Addendum Note (Signed)
 Addended by: WOLFGANG CONLEY HERO on: 04/10/2024 03:35 PM   Modules accepted: Orders

## 2024-04-10 NOTE — Addendum Note (Signed)
 Addended by: Irfan Veal S on: 04/10/2024 10:22 AM   Modules accepted: Orders
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
## 2024-04-15 ENCOUNTER — Ambulatory Visit (HOSPITAL_BASED_OUTPATIENT_CLINIC_OR_DEPARTMENT_OTHER): Attending: Orthopedic Surgery | Admitting: Physical Therapy

## 2024-04-15 ENCOUNTER — Ambulatory Visit (INDEPENDENT_AMBULATORY_CARE_PROVIDER_SITE_OTHER): Admitting: Orthopaedic Surgery

## 2024-04-15 ENCOUNTER — Encounter (HOSPITAL_BASED_OUTPATIENT_CLINIC_OR_DEPARTMENT_OTHER): Payer: Self-pay | Admitting: Physical Therapy

## 2024-04-15 ENCOUNTER — Other Ambulatory Visit (HOSPITAL_BASED_OUTPATIENT_CLINIC_OR_DEPARTMENT_OTHER): Payer: Self-pay

## 2024-04-15 ENCOUNTER — Ambulatory Visit: Admitting: Family Medicine

## 2024-04-15 ENCOUNTER — Other Ambulatory Visit (HOSPITAL_BASED_OUTPATIENT_CLINIC_OR_DEPARTMENT_OTHER): Payer: Self-pay | Admitting: Orthopaedic Surgery

## 2024-04-15 DIAGNOSIS — M6281 Muscle weakness (generalized): Secondary | ICD-10-CM

## 2024-04-15 DIAGNOSIS — S76119A Strain of unspecified quadriceps muscle, fascia and tendon, initial encounter: Secondary | ICD-10-CM

## 2024-04-15 DIAGNOSIS — G8929 Other chronic pain: Secondary | ICD-10-CM

## 2024-04-15 DIAGNOSIS — R29898 Other symptoms and signs involving the musculoskeletal system: Secondary | ICD-10-CM

## 2024-04-15 DIAGNOSIS — M25511 Pain in right shoulder: Secondary | ICD-10-CM

## 2024-04-15 DIAGNOSIS — R2689 Other abnormalities of gait and mobility: Secondary | ICD-10-CM

## 2024-04-15 MED ORDER — SULFAMETHOXAZOLE-TRIMETHOPRIM 800-160 MG PO TABS
1.0000 | ORAL_TABLET | Freq: Two times a day (BID) | ORAL | 0 refills | Status: AC
  Filled 2024-04-15: qty 14, 7d supply, fill #0
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
## 2024-04-15 NOTE — Progress Notes (Signed)
 Pt arrives to PT with increased redness, swelling and secretions coming out of R knee.
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
## 2024-04-16 ENCOUNTER — Other Ambulatory Visit (HOSPITAL_BASED_OUTPATIENT_CLINIC_OR_DEPARTMENT_OTHER): Payer: Self-pay

## 2024-04-20 ENCOUNTER — Ambulatory Visit: Admitting: Family Medicine

## 2024-04-22 ENCOUNTER — Encounter (HOSPITAL_BASED_OUTPATIENT_CLINIC_OR_DEPARTMENT_OTHER): Admitting: Physical Therapy

## 2024-04-22 ENCOUNTER — Encounter (HOSPITAL_BASED_OUTPATIENT_CLINIC_OR_DEPARTMENT_OTHER): Admitting: Orthopaedic Surgery

## 2024-04-24 ENCOUNTER — Other Ambulatory Visit

## 2024-04-29 ENCOUNTER — Encounter (HOSPITAL_BASED_OUTPATIENT_CLINIC_OR_DEPARTMENT_OTHER): Admitting: Orthopaedic Surgery

## 2024-04-29 ENCOUNTER — Encounter (HOSPITAL_BASED_OUTPATIENT_CLINIC_OR_DEPARTMENT_OTHER): Admitting: Physical Therapy

## 2024-04-30 ENCOUNTER — Telehealth: Admitting: Family

## 2024-05-06 ENCOUNTER — Encounter (HOSPITAL_BASED_OUTPATIENT_CLINIC_OR_DEPARTMENT_OTHER): Admitting: Physical Therapy

## 2024-05-07 ENCOUNTER — Ambulatory Visit: Admitting: Family Medicine

## 2024-05-13 ENCOUNTER — Encounter (HOSPITAL_BASED_OUTPATIENT_CLINIC_OR_DEPARTMENT_OTHER): Admitting: Physical Therapy

## 2024-05-20 ENCOUNTER — Encounter (HOSPITAL_BASED_OUTPATIENT_CLINIC_OR_DEPARTMENT_OTHER): Admitting: Physical Therapy

## 2024-05-27 ENCOUNTER — Encounter (HOSPITAL_BASED_OUTPATIENT_CLINIC_OR_DEPARTMENT_OTHER): Admitting: Physical Therapy

## 2024-06-03 ENCOUNTER — Encounter (HOSPITAL_BASED_OUTPATIENT_CLINIC_OR_DEPARTMENT_OTHER): Admitting: Physical Therapy

## 2024-06-04 ENCOUNTER — Ambulatory Visit: Admitting: Family Medicine

## 2024-06-10 ENCOUNTER — Encounter (HOSPITAL_BASED_OUTPATIENT_CLINIC_OR_DEPARTMENT_OTHER): Admitting: Physical Therapy

## 2024-06-17 ENCOUNTER — Encounter (HOSPITAL_BASED_OUTPATIENT_CLINIC_OR_DEPARTMENT_OTHER): Admitting: Physical Therapy

## 2024-06-24 ENCOUNTER — Ambulatory Visit: Admitting: Internal Medicine
